# Patient Record
Sex: Male | Born: 1945 | Race: Black or African American | Hispanic: No | Marital: Single | Smoking: Former smoker
Health system: Southern US, Community
[De-identification: ages and names within clinical notes are randomized; demographics above are authoritative.]

## PROBLEM LIST (undated history)

## (undated) ENCOUNTER — Emergency Department (HOSPITAL_COMMUNITY): Admission: EM | Payer: Medicare Other | Source: Home / Self Care

## (undated) DIAGNOSIS — C3490 Malignant neoplasm of unspecified part of unspecified bronchus or lung: Secondary | ICD-10-CM

## (undated) DIAGNOSIS — Z923 Personal history of irradiation: Secondary | ICD-10-CM

## (undated) DIAGNOSIS — E119 Type 2 diabetes mellitus without complications: Secondary | ICD-10-CM

## (undated) DIAGNOSIS — I10 Essential (primary) hypertension: Secondary | ICD-10-CM

## (undated) DIAGNOSIS — T7840XA Allergy, unspecified, initial encounter: Secondary | ICD-10-CM

## (undated) DIAGNOSIS — E785 Hyperlipidemia, unspecified: Secondary | ICD-10-CM

## (undated) HISTORY — DX: Essential (primary) hypertension: I10

## (undated) HISTORY — DX: Type 2 diabetes mellitus without complications: E11.9

## (undated) HISTORY — PX: OTHER SURGICAL HISTORY: SHX169

## (undated) HISTORY — DX: Allergy, unspecified, initial encounter: T78.40XA

---

## 2012-07-04 ENCOUNTER — Encounter (HOSPITAL_COMMUNITY): Payer: Self-pay | Admitting: Emergency Medicine

## 2012-07-04 ENCOUNTER — Emergency Department (INDEPENDENT_AMBULATORY_CARE_PROVIDER_SITE_OTHER)
Admission: EM | Admit: 2012-07-04 | Discharge: 2012-07-04 | Disposition: A | Discharge status: Home / Self Care | Payer: Medicare Other | Source: Home / Self Care | Attending: Family Medicine | Admitting: Family Medicine

## 2012-07-04 DIAGNOSIS — L723 Sebaceous cyst: Secondary | ICD-10-CM

## 2012-07-04 MED ORDER — HYDROCODONE-ACETAMINOPHEN 5-325 MG PO TABS: 1.0000 | ORAL_TABLET | Freq: Four times a day (QID) | ORAL | Status: DC | PRN

## 2012-07-04 NOTE — ED Notes (Signed)
Pt c/o cyst in center of back x 3 days. Swelling and painful. Pt denies drainage.  Pt has used tylenol with no relief of pain.

## 2012-07-04 NOTE — ED Provider Notes (Signed)
History     CSN: [id number]  Arrival date & time 07/04/12  1350   First MD Initiated Contact with Patient 07/04/12 1359      Chief Complaint  Patient presents with  . Cyst    cyst on middle of back x 3 days. no drainage. swollen and painful    (Consider location/radiation/quality/duration/timing/severity/associated sxs/prior treatment) Patient is a 67 y.o. male presenting with abscess. The history is provided by the patient and a relative.  Abscess Location:  Torso Torso abscess location:  Lower back Abscess quality: painful   Red streaking: no   Duration:  2 days Progression:  Worsening Pain details:    Quality:  Dull   Progression:  Worsening Chronicity:  New   History reviewed. No pertinent past medical history.  History reviewed. No pertinent past surgical history.  History reviewed. No pertinent family history.  History  Substance Use Topics  . Smoking status: Never Smoker   . Smokeless tobacco: Not on file  . Alcohol Use: No      Review of Systems  Constitutional: Negative.   Skin: Positive for rash.    Allergies  Review of patient's allergies indicates no known allergies.  Home Medications  No current outpatient prescriptions on file.  There were no vitals taken for this visit.  Physical Exam  Nursing note and vitals reviewed. Constitutional: He is oriented to person, place, and time. He appears well-developed and well-nourished.  Neurological: He is alert and oriented to person, place, and time.  Skin: Skin is warm and dry.  1.5 cm nonerythematous sl fluctuant cystic area on right lumbar back, no drainage.    ED Course  Procedures (including critical care time)  Labs Reviewed - No data to display No results found.   1. Sebaceous cyst       MDM          Linna Hoff, MD 07/04/12 [phone]

## 2012-07-06 ENCOUNTER — Emergency Department (INDEPENDENT_AMBULATORY_CARE_PROVIDER_SITE_OTHER)
Admission: EM | Admit: 2012-07-06 | Discharge: 2012-07-06 | Disposition: A | Discharge status: Home / Self Care | Payer: Medicare Other | Source: Home / Self Care | Attending: Emergency Medicine | Admitting: Emergency Medicine

## 2012-07-06 ENCOUNTER — Encounter (HOSPITAL_COMMUNITY): Payer: Self-pay | Admitting: *Deleted

## 2012-07-06 DIAGNOSIS — L723 Sebaceous cyst: Secondary | ICD-10-CM

## 2012-07-06 DIAGNOSIS — Z4800 Encounter for change or removal of nonsurgical wound dressing: Secondary | ICD-10-CM

## 2012-07-06 MED ORDER — CEPHALEXIN 500 MG PO CAPS: 500.0000 mg | ORAL_CAPSULE | Freq: Three times a day (TID) | ORAL | Status: DC

## 2012-07-06 MED ORDER — HYDROCODONE-ACETAMINOPHEN 5-325 MG PO TABS: ORAL_TABLET | ORAL | Status: DC

## 2012-07-06 NOTE — ED Provider Notes (Signed)
Chief Complaint  Patient presents with  . Abscess    History of Present Illness:    Benjamin Willis is a 67 year old male who has had a long-standing history of a cystic lesion on his right mid back. This recently has become painful. It's not been draining a pus and he's had no fever or chills.  Review of Systems:  Other than noted above, the patient denies any of the following symptoms: Systemic:  No fever, chills or sweats. Skin:  No rash or itching.  PMFSH:  Past medical history, family history, social history, meds, and allergies were reviewed.  No history of diabetes or prior history of abscesses or MRSA.  Physical Exam:   Vital signs:  BP 161/80  Pulse 73  Temp(Src) 97.9 F (36.6 C) (Oral)  Resp 12  SpO2 100% Skin:  There is a cystic lesion on the right mid back. It's tender to touch. There is no erythema or fluctuance.  Skin exam was otherwise normal.  No rash. Ext:  Distal pulses were full, patient has full ROM of all joints.  Procedure:  Verbal informed consent was obtained.  The patient was informed of the risks and benefits of the procedure and understands and accepts.  Identity of the patient was verified verbally and by wristband.   The abscess area described above was prepped with Betadine and alcohol and anesthetized with 3 mL of 2% Xylocaine with epinephrine.  Using a #11 scalpel blade, a singe straight incision was made into the area of swelling, yielding a large amount of malodorous squamous debris drainage.  Routine cultures were obtained.  As much of the squamous debris was removed as possible. Blunt dissection was used to break up loculations and the resulting wound cavity was packed with 1/4 inch Iodoform gauze.  A sterile pressure dressing was applied.  Assessment:  The encounter diagnosis was Sebaceous cyst.  Plan:   1.  The following meds were prescribed:   New Prescriptions   CEPHALEXIN (KEFLEX) 500 MG CAPSULE    Take 1 capsule (500 mg total) by mouth 3 (three)  times daily.   HYDROCODONE-ACETAMINOPHEN (NORCO/VICODIN) 5-325 MG PER TABLET    1 to 2 tabs every 4 to 6 hours as needed for pain.   2.  The patient was instructed in symptomatic care and handouts were given. 3.  The patient was instructed to leave the dressing in place and return again in 48 hours for packing removal.   Reuben Likes, MD 07/06/12 [phone]

## 2012-07-06 NOTE — ED Notes (Signed)
Pt has sebaceous cyst on back that he was seen here for on Sunday. States that he is still in extreme pain  - sister reports that he has not been applying hot compresses and has been unable to find a regular md. Referred to [facility]

## 2012-07-08 ENCOUNTER — Encounter (HOSPITAL_COMMUNITY): Payer: Self-pay | Admitting: *Deleted

## 2012-07-08 ENCOUNTER — Emergency Department (INDEPENDENT_AMBULATORY_CARE_PROVIDER_SITE_OTHER)
Admission: EM | Admit: 2012-07-08 | Discharge: 2012-07-08 | Disposition: A | Discharge status: Home / Self Care | Payer: Medicare Other | Source: Home / Self Care | Attending: Emergency Medicine | Admitting: Emergency Medicine

## 2012-07-08 DIAGNOSIS — Z4800 Encounter for change or removal of nonsurgical wound dressing: Secondary | ICD-10-CM

## 2012-07-08 DIAGNOSIS — L723 Sebaceous cyst: Secondary | ICD-10-CM

## 2012-07-08 NOTE — ED Notes (Signed)
Pt here to have packing removed and wound recheck

## 2012-07-08 NOTE — ED Provider Notes (Signed)
Chief Complaint  Patient presents with  . Wound Check    History of Present Illness:    Benjamin Willis is a 67 year old male who was here a couple days ago because of inflamed sebaceous cyst on his mid back. This was incised and a large amount of squamous debris was drained out. The culture so far is negative. The wound cavity was packed and he returns today for packing removal.  Review of Systems:  Other than noted above, the patient denies any of the following symptoms: Systemic:  No fever, chills or sweats. Skin:  No rash or itching.  PMFSH:  Past medical history, family history, social history, meds, and allergies were reviewed.  No history of diabetes or prior history of abscesses or MRSA.  Physical Exam:   Vital signs:  BP 163/89  Pulse 69  Temp(Src) 97.2 F (36.2 C) (Oral)  Resp 18  SpO2 99% Skin:  Dressing was removed and the packing was taken out. There is no erythema induration or pain to palpation.  Skin exam was otherwise normal.  No rash. Ext:  Distal pulses were full, patient has full ROM of all joints.  Assessment:  The encounter diagnosis was Sebaceous cyst.  Plan:   1.  The following meds were prescribed:   Discharge Medication List as of 07/08/2012 10:37 AM     2.  The patient was instructed in symptomatic care and handouts were given. 3.  The patient was instructed to cleanse the area daily with soap and water and apply antibiotic ointment. This is truly helping him to do this. Suggested that if this recurs he'll need to see a surgeon for a complete surgical removal.   Reuben Likes, MD 07/08/12 [phone]

## 2012-07-09 LAB — CULTURE, ROUTINE-ABSCESS

## 2012-07-14 ENCOUNTER — Encounter (HOSPITAL_COMMUNITY): Payer: Self-pay

## 2012-07-14 ENCOUNTER — Emergency Department (INDEPENDENT_AMBULATORY_CARE_PROVIDER_SITE_OTHER)
Admission: EM | Admit: 2012-07-14 | Discharge: 2012-07-14 | Disposition: A | Discharge status: Home / Self Care | Payer: Medicare Other | Source: Home / Self Care

## 2012-07-14 DIAGNOSIS — L723 Sebaceous cyst: Secondary | ICD-10-CM

## 2012-07-14 NOTE — ED Notes (Signed)
Patient here to establish care with primary doctor

## 2012-07-14 NOTE — ED Provider Notes (Signed)
History     CSN: [id number]  Arrival date & time 07/14/12  1037  Followup after incision and drainage of small sebaceous cyst in the mid back area  Chief Complaint  Patient presents with  . Establish Care    (Consider location/radiation/quality/duration/timing/severity/associated sxs/prior treatment) HPI Patient is 67 year old male who was recently seen in the clinic with cyst in the mid back area and is now status post incision and drainage. Patient reports doing well and believes that the area is healing very well. He denies any problems in that area, no drainage, no blood noted coming out of the area. Patient denies pain, tenderness to touch. Patient also denies fevers and chills or other systemic concerns.  History of hypertension  No family history of heart disease or cancer  History  Substance Use Topics  . Smoking status: Never Smoker   . Smokeless tobacco: Not on file  . Alcohol Use: No     Review of Systems  Constitutional: Negative for fever, chills, diaphoresis, activity change, appetite change and fatigue.  HENT: Negative for ear pain, nosebleeds, congestion, facial swelling, rhinorrhea, neck pain, neck stiffness and ear discharge.   Eyes: Negative for pain, discharge, redness, itching and visual disturbance.  Respiratory: Negative for cough, choking, chest tightness, shortness of breath, wheezing and stridor.   Cardiovascular: Negative for chest pain, palpitations and leg swelling.  Gastrointestinal: Negative for abdominal distention.  Genitourinary: Negative for dysuria, urgency, frequency, hematuria, flank pain, decreased urine volume, difficulty urinating and dyspareunia.  Musculoskeletal: Negative for back pain, joint swelling, arthralgias and gait problem.  Neurological: Negative for dizziness, tremors, seizures, syncope, facial asymmetry, speech difficulty, weakness, light-headedness, numbness and headaches.  Hematological: Negative for adenopathy. Does not  bruise/bleed easily.  Psychiatric/Behavioral: Negative for hallucinations, behavioral problems, confusion, dysphoric mood, decreased concentration and agitation.    Allergies  Review of patient's allergies indicates no known allergies.  Home Medications   Current Outpatient Rx  Name  Route  Sig  Dispense  Refill  . cephALEXin (KEFLEX) 500 MG capsule   Oral   Take 1 capsule (500 mg total) by mouth 3 (three) times daily.   30 capsule   0   . HYDROcodone-acetaminophen (NORCO/VICODIN) 5-325 MG per tablet   Oral   Take 1 tablet by mouth every 6 (six) hours as needed for pain.   6 tablet   0   . HYDROcodone-acetaminophen (NORCO/VICODIN) 5-325 MG per tablet      1 to 2 tabs every 4 to 6 hours as needed for pain.   20 tablet   0     BP 165/91  Pulse 63  Temp(Src) 98.8 F (37.1 C) (Oral)  SpO2 100%  Physical Exam  Physical Exam  Constitutional: Appears well-developed and well-nourished. No distress.  HENT: Normocephalic. External right and left ear normal. Oropharynx is clear and moist.  Eyes: Conjunctivae and EOM are normal. PERRLA, no scleral icterus.  Neck: Normal ROM. Neck supple. No JVD. No tracheal deviation. No thyromegaly.  CVS: RRR, S1/S2 +, no murmurs, no gallops, no carotid bruit.  Pulmonary: Effort and breath sounds normal, no stridor, rhonchi, wheezes, rales.  Abdominal: Soft. BS +,  no distension, tenderness, rebound or guarding.  Musculoskeletal: Normal range of motion. No edema and no tenderness.  Lymphadenopathy: No lymphadenopathy noted, cervical, inguinal. Neuro: Alert. Normal reflexes, muscle tone coordination. No cranial nerve deficit. Skin: A small area of the incision site noted in the mid back area, healing nicely, no erythema, no tenderness to palpation, no swelling  or drainage noted  Psychiatric: Normal mood and affect. Behavior, judgment, thought content normal.    ED Course  Procedures (including critical care time)  Labs Reviewed - No data  to display No results found.   Sebaceous cyst in the back area - Status post drainage approximately 2 weeks ago, area healing very nicely  - Recommended continued care with lukewarm water as needed, no bandage required at the site - Patient advised to take analgesic medicine if needed - Pt advised if the area gets worse he needs to come back and see [place]   MDM  Status post incision and drainage of sebaceous cyst in the back area        Alison Murray, MD 07/14/12 1131

## 2014-12-21 ENCOUNTER — Emergency Department (HOSPITAL_COMMUNITY)
Admission: EM | Admit: 2014-12-21 | Discharge: 2014-12-21 | Disposition: A | Discharge status: Home / Self Care | Payer: Medicare Other | Attending: Emergency Medicine | Admitting: Emergency Medicine

## 2014-12-21 ENCOUNTER — Encounter (HOSPITAL_COMMUNITY): Payer: Self-pay | Admitting: Cardiology

## 2014-12-21 ENCOUNTER — Emergency Department (HOSPITAL_COMMUNITY): Payer: Medicare Other

## 2014-12-21 DIAGNOSIS — R059 Cough, unspecified: Secondary | ICD-10-CM

## 2014-12-21 DIAGNOSIS — R05 Cough: Secondary | ICD-10-CM | POA: Diagnosis present

## 2014-12-21 DIAGNOSIS — R079 Chest pain, unspecified: Secondary | ICD-10-CM | POA: Diagnosis not present

## 2014-12-21 DIAGNOSIS — J189 Pneumonia, unspecified organism: Secondary | ICD-10-CM

## 2014-12-21 DIAGNOSIS — J159 Unspecified bacterial pneumonia: Secondary | ICD-10-CM | POA: Diagnosis not present

## 2014-12-21 DIAGNOSIS — Z720 Tobacco use: Secondary | ICD-10-CM | POA: Insufficient documentation

## 2014-12-21 DIAGNOSIS — Z792 Long term (current) use of antibiotics: Secondary | ICD-10-CM | POA: Insufficient documentation

## 2014-12-21 LAB — CBC WITH DIFFERENTIAL/PLATELET
BASOS PCT: 1 % (ref 0–1)
Basophils Absolute: 0.1 10*3/uL (ref 0.0–0.1)
EOS PCT: 2 % (ref 0–5)
Eosinophils Absolute: 0.1 10*3/uL (ref 0.0–0.7)
HEMATOCRIT: 39.7 % (ref 39.0–52.0)
HEMOGLOBIN: 13 g/dL (ref 13.0–17.0)
LYMPHS PCT: 36 % (ref 12–46)
Lymphs Abs: 2.4 10*3/uL (ref 0.7–4.0)
MCH: 30.6 pg (ref 26.0–34.0)
MCHC: 32.7 g/dL (ref 30.0–36.0)
MCV: 93.4 fL (ref 78.0–100.0)
MONOS PCT: 13 % — AB (ref 3–12)
Monocytes Absolute: 0.9 10*3/uL (ref 0.1–1.0)
NEUTROS PCT: 48 % (ref 43–77)
Neutro Abs: 3.1 10*3/uL (ref 1.7–7.7)
PLATELETS: 273 10*3/uL (ref 150–400)
RBC: 4.25 MIL/uL (ref 4.22–5.81)
RDW: 12.2 % (ref 11.5–15.5)
SMEAR REVIEW: ADEQUATE
WBC: 6.6 10*3/uL (ref 4.0–10.5)

## 2014-12-21 LAB — BASIC METABOLIC PANEL
ANION GAP: 6 (ref 5–15)
BUN: 8 mg/dL (ref 6–20)
CO2: 30 mmol/L (ref 22–32)
Calcium: 9.2 mg/dL (ref 8.9–10.3)
Chloride: 106 mmol/L (ref 101–111)
Creatinine, Ser: 0.71 mg/dL (ref 0.61–1.24)
GFR calc Af Amer: >60 mL/min (ref >60)
GLUCOSE: 66 mg/dL (ref 65–99)
POTASSIUM: 4.4 mmol/L (ref 3.5–5.1)
Sodium: 142 mmol/L (ref 135–145)

## 2014-12-21 LAB — I-STAT TROPONIN, ED: Troponin i, poc: 0 ng/mL (ref 0.00–0.08)

## 2014-12-21 IMAGING — DX DG CHEST 2V
2 series · 2 of 2 positions shown · non-contrast
Comparison: None.

CLINICAL DATA: Fever and productive cough. Chest pain. Symptoms for
3 days. Initial encounter.

EXAM:
CHEST  2 VIEW

[w chest pa]
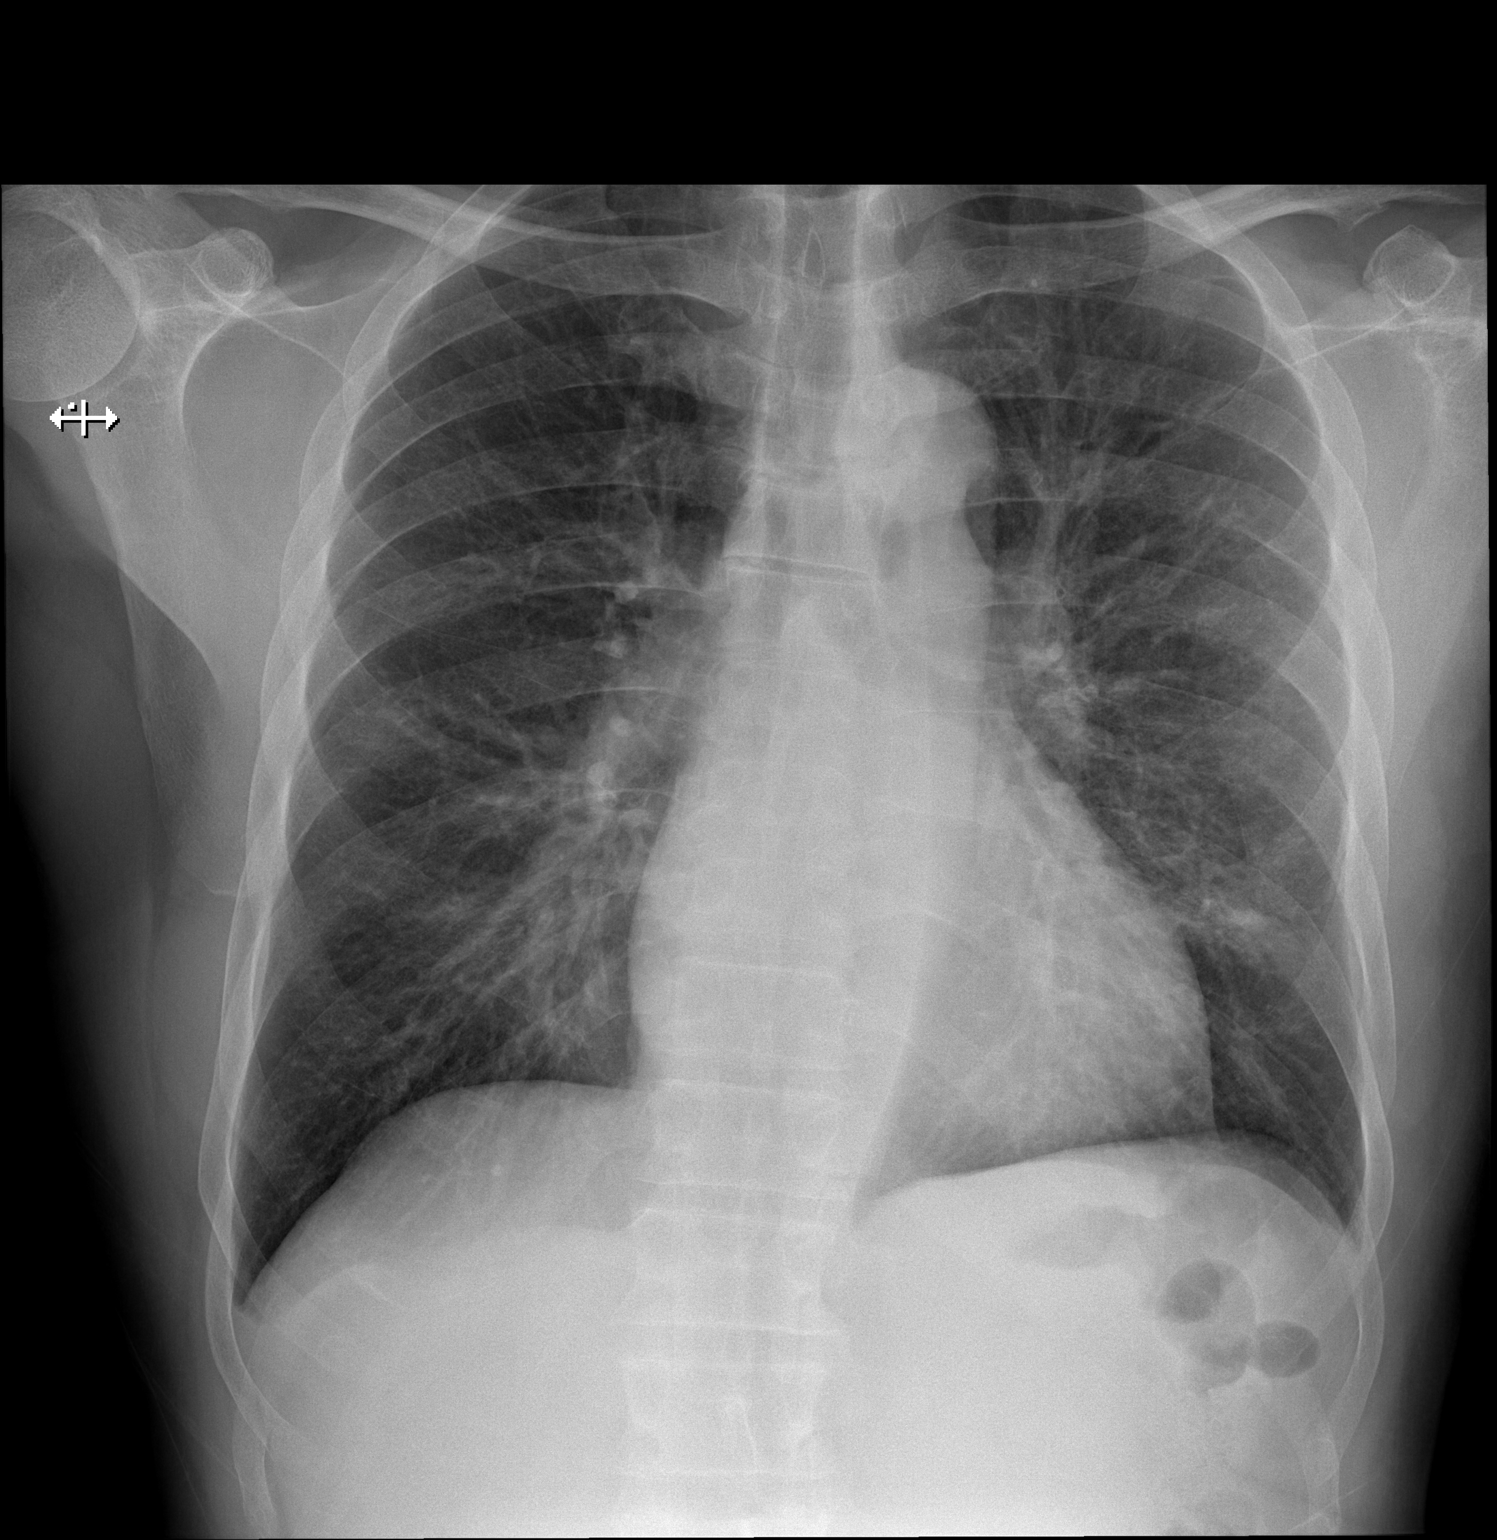

[w chest lat]
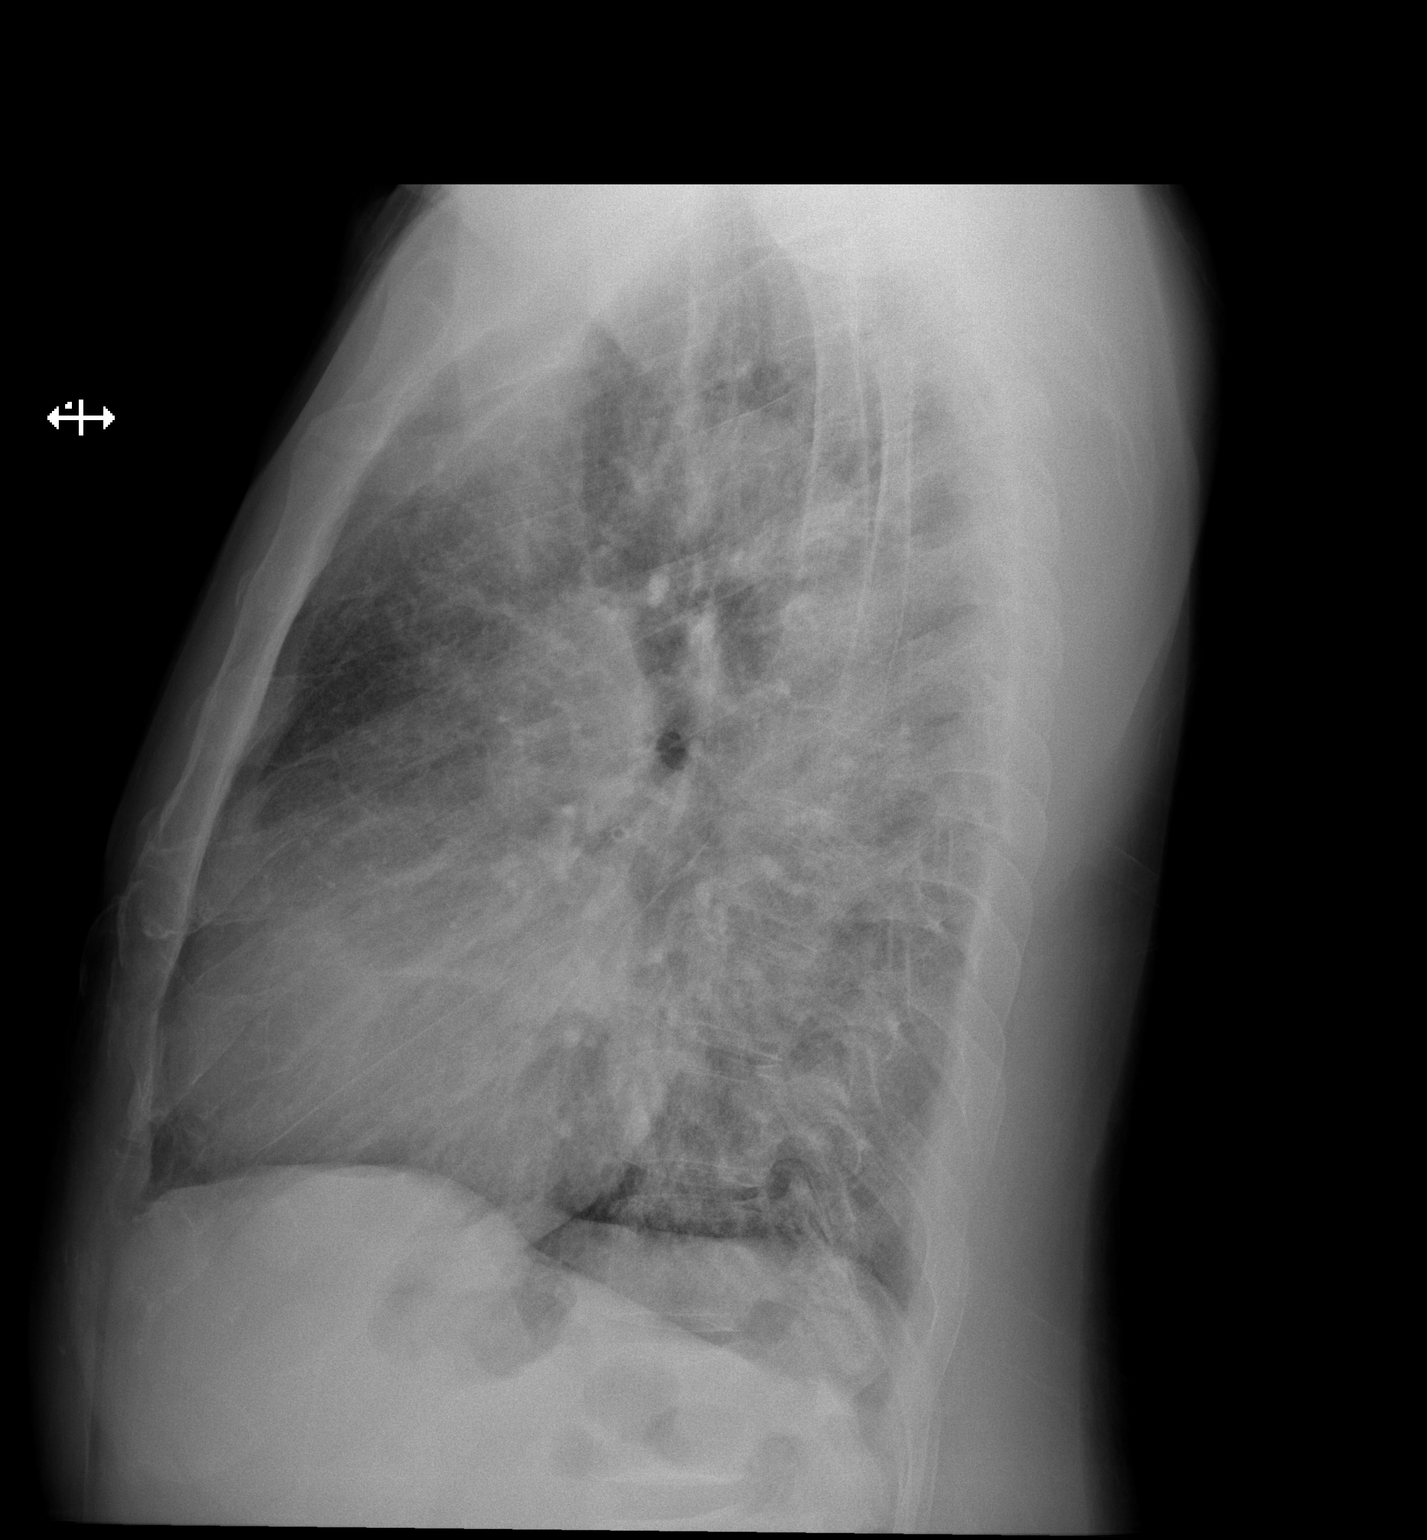

[2 of 2 positions shown; findings below may reference images not displayed]

FINDINGS: Hazy airspace disease is seen in the right lower lobe and superior
segment of the left lower lobe. No pneumothorax or pleural effusion
is identified. Heart size is upper normal.
IMPRESSION: Hazy bilateral airspace disease is most worrisome for pneumonia
given symptoms. Recommend followup to clearing.

## 2014-12-21 MED ORDER — PREDNISONE 20 MG PO TABS
40.0000 mg | ORAL_TABLET | Freq: Every day | ORAL | Status: DC
Start: 2014-12-21 — End: 2017-03-19

## 2014-12-21 MED ORDER — AZITHROMYCIN 250 MG PO TABS: 250.0000 mg | ORAL_TABLET | Freq: Every day | ORAL | Status: DC

## 2014-12-21 MED ORDER — ALBUTEROL SULFATE HFA 108 (90 BASE) MCG/ACT IN AERS: 1.0000 | INHALATION_SPRAY | Freq: Four times a day (QID) | RESPIRATORY_TRACT | Status: DC | PRN

## 2014-12-21 MED ORDER — PREDNISONE 20 MG PO TABS
60.0000 mg | ORAL_TABLET | Freq: Once | ORAL | Status: AC
Start: 2014-12-21 — End: 2014-12-21
  Administered 2014-12-21: 60 mg via ORAL
  Filled 2014-12-21: qty 3

## 2014-12-21 MED ORDER — ALBUTEROL SULFATE (2.5 MG/3ML) 0.083% IN NEBU
5.0000 mg | INHALATION_SOLUTION | Freq: Once | RESPIRATORY_TRACT | Status: AC
  Administered 2014-12-21: 5 mg via RESPIRATORY_TRACT
  Filled 2014-12-21: qty 6

## 2014-12-21 NOTE — Discharge Instructions (Signed)
Take the prescribed medication as directed. Follow-up with a primary care physician in the area-- see resource guide below to help you with this. Return to the ED for new or worsening symptoms.   Emergency Department Resource Guide 1) Find a Doctor and Pay Out of Pocket Although you won't have to find out who is covered by your insurance plan, it is a good idea to ask around and get recommendations. You will then need to call the office and see if the doctor you have chosen will accept you as a new patient and what types of options they offer for patients who are self-pay. Some doctors offer discounts or will set up payment plans for their patients who do not have insurance, but you will need to ask so you aren't surprised when you get to your appointment.  2) Contact Your Local Health Department Not all health departments have doctors that can see patients for sick visits, but many do, so it is worth a call to see if yours does. If you don't know where your local health department is, you can check in your phone book. The CDC also has a tool to help you locate your state's health department, and many state websites also have listings of all of their local health departments.  3) Find a [facility] If your illness is not likely to be very severe or complicated, you may want to try a walk in clinic. These are popping up all over the country in pharmacies, drugstores, and shopping centers. They're usually staffed by nurse practitioners or physician assistants that have been trained to treat common illnesses and complaints. They're usually fairly quick and inexpensive. However, if you have serious medical issues or chronic medical problems, these are probably not your best option.  No Primary Care Doctor: - Call Health Connect at  [phone] - they can help you locate a primary care doctor that  accepts your insurance, provides certain services, etc. - Physician Referral Service-  [phone]  Chronic Pain Problems: Organization         Address  Phone   Notes  [facility]  [phone] Patients need to be referred by their primary care doctor.   Medication Assistance: Organization         Address  Phone   Notes  [facility] Medication [facility] [address] [place], [place] [address] [phone] --Must be a resident of [facility] -- Must have NO insurance coverage whatsoever (no Medicaid/ Medicare, etc.) -- The pt. MUST have a primary care doctor that directs their care regularly and follows them in the community   Med[facility]ist  [phone]   [employer]  [phone]    Agencies that provide inexpensive medical care: Organization         Address  Phone   Notes  [place]  [phone]   [facility]    [phone]   [facility] [place], [place] [address] [phone]   [place] [address] [place] [phone]   Planned Parenthood    [phone]   [facility]    [phone]   [place] and [place] [address], [place] Phone:  [phone], Fax:  [phone] Hours of Operation:  9 am - 6 pm, M-F.  Also accepts Medicaid/Medicare and self-pay.  [facility]  [place], [address], [place] Phone: [phone], Fax: [phone]. Hours of Operation:  8:30 am - 5:30 pm, M-F.  Also accepts Medicaid and self-pay.  [facility] [address], [place] Phone: [phone]   [place], [place], [place] [phone], Ext. [phone] Mondays & Thursdays: 7-9 AM.  First 15 patients are seen on a first come, first serve basis.    [place] Providers:  Organization         Address  Phone   Notes  [facility] [address],  [place] [phone] Also accepts self-pay patients.  [facility] [address], [place]  [phone]   [place], [address], [place] [phone]   [facility] [address] [place] [phone]   Lucianne Lei [address], [place]   [phone] Only accepts [place] Access [place] patients after they have their name applied to their card.   Self-Pay (no insurance) in [facility]:  Organization         Address  Phone   Notes  Sickle Cell Patients, [facility] Internal [facility] [place] [phone]   [facility] [place] [phone]   [facility] [place]  [place], [place], [place] [phone]   [facility]/Dr. Osei-Bonsu  [address], [place] or [address], [place] [phone] Phone number for both [place] and [place] locations is the same.  Urgent Medical [facility] [address], [place] [phone]   [facility] [address] [place] or [address] [phone]   [facility] [address] [place] [phone], phone; [phone], fax Sees patients 1st and 3rd Saturday of every month.  Must not qualify for public or private insurance (i.e. Medicaid, Medicare, [place] Health Choice, Veterans' Benefits)  Household income should be no more than 200% of the poverty level The clinic cannot treat you if you are pregnant or think you are pregnant  Sexually transmitted diseases are not treated at the clinic.    Dental Care: Organization         Address  Phone  Notes  [facility] Department of [facility] [place] [phone] Accepts children up to age 50 who are enrolled in  [place] or [place]; pregnant women with a Medicaid card; and children who have applied for Medicaid or [place] Health Choice, but were declined, whose parents can pay a reduced fee at time of service.  [facility] Department of [facility]  [address], [place] [phone] Accepts children up to age 26 who are enrolled in [place] or [place]; pregnant women with a Medicaid card; and children who have applied for Medicaid or [place] Health Choice, but were declined, whose parents can pay a reduced fee at time of service.  [place] Adult Dental Access PROGRAM  [place] [phone] Patients are seen by appointment only. Walk-ins are not accepted. [place] will see patients 70 years of age and older. Monday - Tuesday (8am-5pm) Most Wednesdays (8:30-5pm) $30 per visit, cash only  [place] Adult  Dental Access PROGRAM  [address], [facility] [phone] Patients are seen by appointment only. Walk-ins are not accepted. [place] will see patients 42 years of age and older. One Wednesday Evening (Monthly: Volunteer Based).  $30 per visit, cash only  [place]  [phone] for adults; Children under age 54, call Graduate Pediatric Dentistry at [phone]. Children aged 65-14, please call [phone] to request a pediatric application.  Dental services are provided in all areas of dental care including fillings, crowns and bridges, complete and partial dentures, implants, gum treatment, root canals, and extractions. Preventive care is also provided. Treatment is provided to both adults and children. Patients are selected via a lottery and there is often a waiting list.   [facility] [address] [place]  [phone] [email or web address]   [facility] [address] [place], [place] [phone], Ext. [phone] Second and Fourth Thursday of each month, opens at 6:30  AM; Clinic ends at 9 AM.  Patients are seen on a first-come first-served basis, and a limited number are seen during each clinic.   [facility]  [address] Hillard Danker [place], [place] [phone]   Eligibility Requirements You must have lived in [place], [place], or [place] counties for at least the last three months.   You cannot be eligible for state or federal sponsored [employer], including [employer], [place], or [employer].   You generally cannot be eligible for healthcare insurance through your employer.    How to apply: Eligibility screenings are held every Tuesday and Wednesday afternoon from 1:00 pm until 4:00 pm. You do not need an appointment for the interview!  [facility] [address] [place], [place]   [place]  [place] Department  [place]  [phone]    Behavioral Health Resources in the Community: Intensive Outpatient Programs Organization         Address  Phone  Notes  [place]. [address], [place], [place] [phone]   [facility] Outpatient [address], [place], [place]   ADS: Alcohol & Drug Svcs [address] [place], [place]   [place] [address]  [place], [place] or [phone]   Substance Abuse Resources Organization         Address  Phone  Notes  Alcohol and Drug Services  [phone]   [place]  [phone]   [facility] [place]   Chinita Pester  [phone]   Residential & Outpatient Substance Abuse Program  [phone]   Psychological Services Organization         Address  Phone  Notes  [facility] [place]  [place]  [phone]   [place] [address], [place] [phone] or  [phone]    Mobile Crisis Teams Organization         Address  Phone  Notes  [facility], Mobile Crisis Care Unit  [phone]   Assertive Psychotherapeutic Services  [address] [place], [place]   [place] [address] [place] [phone]    Self-Help/Support Groups Organization         Address  Phone             Notes  Mental  Health Assoc. of [place] - variety of support groups  [place] Call for more information  Narcotics Anonymous (NA), [facility]aring [facility] [address], [employer] [place]  2 meetings at this location   Special educational needs teacher         Address  Phone  Notes  ASAP Residential Treatment [place],    [place]  [phone]   [facility]  [address] [place] [address], [place], [place]   [place], [place] [phone] Admissions: 8am-3pm M-F  Incentives Substance [place] [address],    [place], [place] [phone]   [facility] [address] [place], [place], [place]   [facility] [place] [facility]ually Violent [facility] Program [address]  [place], [place]   [facility]ight Programs - Intensive Outpatient [address], [place], [place]   [facility] [place].,  [place], [place] [phone] or [phone]   [facility]idential Treatment Services (RTS) [address] [place], [place] Accepts Medicaid  [facility] [place] [address]  [place] [phone] Substance Abuse/Addiction Treatment   [facility] Organization         Address  Phone  Notes  [facility]  [phone]   Domenic Schwab, PhD [address] [place], [place]   [phone] or [phone]   [facility] [place], [place] [phone]   [facility] [address]  [place], [place] [phone] Insurance/Medicaid/sponsorship through [facility] and Families [address] [place]                                    [place], [place] [phone] [place] [address], [place] [phone]    Dr. Adele Schilder  [phone]   [facility] Dept. 1) [address], [place] 2) [place] 3)  [place] [address], [place] [phone]  [phone]   [place] [phone] or [phone] (After Hours)

## 2014-12-21 NOTE — ED Provider Notes (Signed)
CSN: [id number]     Arrival date & time 12/21/14  3976 History   First MD Initiated Contact with Patient 12/21/14 1115     Chief Complaint  Patient presents with  . Hemoptysis     (Consider location/radiation/quality/duration/timing/severity/associated sxs/prior Treatment) The history is provided by the patient and medical records.    This is a 69 year old male with no significant past medical history presenting to the ED for cough. Triage note reports hemoptysis, however patient adamantly denies this. He states he mowed his front lawn a proximally one week ago and after this began having a "chest cold". He reports subjective fever, chills, productive cough with green phlegm, slight headache, chest tightness, and some shortness of breath.  Patient reports his cough seems worse at night when trying to sleep. He states shortness of breath is worse during fits of coughing. Patient has no known cardiac history. He has been a heavy daily smoker for the past 9 years after moving here from [place].  Patient does not currently have a PCP. VSS.  History reviewed. No pertinent past medical history. History reviewed. No pertinent past surgical history. History reviewed. No pertinent family history. Social History  Substance Use Topics  . Smoking status: Current Every Day Smoker  . Smokeless tobacco: None  . Alcohol Use: Yes    Review of Systems  Respiratory: Positive for cough, chest tightness and shortness of breath.   All other systems reviewed and are negative.     Allergies  Review of patient's allergies indicates no known allergies.  Home Medications   Prior to Admission medications   Medication Sig Start Date End Date Taking? Authorizing Provider  cephALEXin (KEFLEX) 500 MG capsule Take 1 capsule (500 mg total) by mouth 3 (three) times daily. 07/06/12   Harden Mo, MD  HYDROcodone-acetaminophen (NORCO/VICODIN) 5-325 MG per tablet Take 1 tablet by mouth every 6 (six) hours as  needed for pain. 07/04/12   Billy Fischer, MD  HYDROcodone-acetaminophen (NORCO/VICODIN) 5-325 MG per tablet 1 to 2 tabs every 4 to 6 hours as needed for pain. 07/06/12   Harden Mo, MD   BP 198/86 mmHg  Pulse 80  Temp(Src) 98.7 F (37.1 C) (Oral)  Resp 18  Wt 210 lb (95.255 kg)  SpO2 96%   Physical Exam  Constitutional: He is oriented to person, place, and time. He appears well-developed and well-nourished. No distress.  HENT:  Head: Normocephalic and atraumatic.  Mouth/Throat: Oropharynx is clear and moist.  Eyes: Conjunctivae and EOM are normal. Pupils are equal, round, and reactive to light.  Neck: Normal range of motion.  Cardiovascular: Normal rate, regular rhythm and normal heart sounds.   Pulmonary/Chest: Effort normal. No respiratory distress. He has wheezes. He has rhonchi.  No distress, speaking in full sentences without difficulty, intermixed wheezes and rhonchi, productive cough noted with yellow phlegm- no hemoptysis  Abdominal: Soft. Bowel sounds are normal.  Musculoskeletal: Normal range of motion.  Neurological: He is alert and oriented to person, place, and time.  Skin: Skin is warm and dry. He is not diaphoretic.  Psychiatric: He has a normal mood and affect.  Nursing note and vitals reviewed.   ED Course  Procedures (including critical care time) Labs Review Labs Reviewed  CBC WITH DIFFERENTIAL/PLATELET - Abnormal; Notable for the following:    Monocytes Relative 13 (*)    All other components within normal limits  BASIC METABOLIC PANEL  I-STAT TROPOININ, ED    Imaging Review Dg Chest 2  View  12/21/2014   CLINICAL DATA:  Fever and productive cough. Chest pain. Symptoms for 3 days. Initial encounter.  EXAM: CHEST  2 VIEW  COMPARISON:  None.  FINDINGS: Hazy airspace disease is seen in the right lower lobe and superior segment of the left lower lobe. No pneumothorax or pleural effusion is identified. Heart size is upper normal.  IMPRESSION: Hazy bilateral  airspace disease is most worrisome for pneumonia given symptoms. Recommend followup to clearing.   Electronically Signed   By: Inge Rise M.D.   On: 12/21/2014 12:01   I have personally reviewed and evaluated these images and lab results as part of my medical decision-making.   EKG Interpretation   Date/Time:  Thursday December 21 2014 12:08:15 EDT Ventricular Rate:  64 PR Interval:  116 QRS Duration: 86 QT Interval:  412 QTC Calculation: 425 R Axis:   73 Text Interpretation:  Sinus rhythm Borderline short PR interval Confirmed  by ZAMMIT  MD, JOSEPH [phone]) on 12/21/2014 1:20:09 PM      MDM   Final diagnoses:  CAP (community acquired pneumonia)  Cough   69 y.o. M here with productive cough.  Triage note reports hemoptysis, however patient adamantly denies this and states he is only coughing up green phlegm. Patient is afebrile, nontoxic. He is in no acute respiratory distress. He does have intermixed wheezes and rhonchi on exam. He is a heavy smoker. We will plan for EKG, labs, CXR.  VSS.  Patient given dose of prednisone and albuterol neb treatment.  EKG sinus rhythm without acute ischemic changes. Labwork is reassuring, no leukocytosis. Troponin negative. Chest x-ray worrisome for pneumonia. Patient's vital signs remained stable on room air. He is not currently requiring any supplemental oxygen and has not had any signs of respiratory distress. Feel it is appropriate to attempt outpatient management. Doubt ACS, PE, dissection, or other acute cardiac event at this time. Symptoms more likely due to his pneumonia. Patient will be started on prednisone taper, azithromycin, and given albuterol inhaler. He does not have a current PCP, I have given him resource guide strongly encouraged him to follow-up.  Discussed plan with patient, he/she acknowledged understanding and agreed with plan of care.  Return precautions given for new or worsening symptoms.  Case discussed with attending  physician, Dr. Roderic Palau, who evaluated patient and agrees with assessment and plan of care.  Larene Pickett, PA-C 12/21/14 [place], PA-C 12/21/14 1419  Milton Ferguson, MD 12/22/14 1524

## 2014-12-21 NOTE — ED Notes (Signed)
Pt reports that he started coughing up blood. Reports that he was has been smoking for many years and is concerned. Denies any pain at this time.

## 2017-01-01 ENCOUNTER — Encounter: Payer: Self-pay | Admitting: Emergency Medicine

## 2017-01-01 ENCOUNTER — Ambulatory Visit (INDEPENDENT_AMBULATORY_CARE_PROVIDER_SITE_OTHER): Payer: Medicare Other | Admitting: Emergency Medicine

## 2017-01-01 VITALS — BP 154/84 | HR 68 | Temp 98.2°F | Resp 16 | Ht 67.5 in | Wt 167.0 lb

## 2017-01-01 DIAGNOSIS — Z1329 Encounter for screening for other suspected endocrine disorder: Secondary | ICD-10-CM

## 2017-01-01 DIAGNOSIS — H1033 Unspecified acute conjunctivitis, bilateral: Secondary | ICD-10-CM | POA: Diagnosis not present

## 2017-01-01 DIAGNOSIS — R030 Elevated blood-pressure reading, without diagnosis of hypertension: Secondary | ICD-10-CM | POA: Diagnosis not present

## 2017-01-01 DIAGNOSIS — Z13228 Encounter for screening for other metabolic disorders: Secondary | ICD-10-CM

## 2017-01-01 DIAGNOSIS — Z1211 Encounter for screening for malignant neoplasm of colon: Secondary | ICD-10-CM

## 2017-01-01 DIAGNOSIS — Z130 Encounter for screening for diseases of the blood and blood-forming organs and certain disorders involving the immune mechanism: Secondary | ICD-10-CM

## 2017-01-01 DIAGNOSIS — Z7189 Other specified counseling: Secondary | ICD-10-CM | POA: Insufficient documentation

## 2017-01-01 MED ORDER — TOBRAMYCIN 0.3 % OP OINT: 1.0000 "application " | TOPICAL_OINTMENT | Freq: Three times a day (TID) | OPHTHALMIC | 1 refills | Status: AC

## 2017-01-01 NOTE — Patient Instructions (Addendum)
IF you received an x-ray today, you will receive an invoice from [facility]. Please contact [facility] at [phone] with questions or concerns regarding your invoice.   IF you received labwork today, you will receive an invoice from [place]. Please contact [facility] at [phone] with questions or concerns regarding your invoice.   Our billing staff will not be able to assist you with questions regarding bills from these companies.  You will be contacted with the lab results as soon as they are available. The fastest way to get your results is to activate your My Chart account. Instructions are located on the last page of this paperwork. If you have not heard from [place] regarding the results in 2 weeks, please contact this office.     Bacterial Conjunctivitis Bacterial conjunctivitis is an infection of your conjunctiva. This is the clear membrane that covers the white part of your eye and the inner surface of your eyelid. This condition can make your eye:  Red or pink.  Itchy.  This condition is caused by bacteria. This condition spreads very easily from person to person (is contagious) and from one eye to the other eye. Follow these instructions at home: Medicines  Take or apply your antibiotic medicine as told by your doctor. Do not stop taking or applying the antibiotic even if you start to feel better.  Take or apply over-the-counter and prescription medicines only as told by your doctor.  Do not touch your eyelid with the eye drop bottle or the ointment tube. Managing discomfort  Wipe any fluid from your eye with a warm, wet washcloth or a cotton ball.  Place a cool, clean washcloth on your eye. Do this for 10-20 minutes, 3-4 times per day. General instructions  Do not wear contact lenses until the irritation is gone. Wear glasses until your doctor says it is okay to wear contacts.  Do not wear eye makeup until your symptoms are gone. Throw away  any old makeup.  Change or wash your pillowcase every day.  Do not share towels or washcloths with anyone.  Wash your hands often with soap and water. Use paper towels to dry your hands.  Do not touch or rub your eyes.  Do not drive or use heavy machinery if your vision is blurry. Contact a doctor if:  You have a fever.  Your symptoms do not get better after 10 days. Get help right away if:  You have a fever and your symptoms suddenly get worse.  You have very bad pain when you move your eye.  Your face: ? Hurts. ? Is red. ? Is swollen.  You have sudden loss of vision. This information is not intended to replace advice given to you by your health care provider. Make sure you discuss any questions you have with your health care provider. Document Released: 01/29/2008 Document Revised: 09/27/2015 Document Reviewed: 02/01/2015 Elsevier Interactive Patient Education  2018 [employer].  How to Take Your Blood Pressure You can take your blood pressure at home with a machine. You may need to check your blood pressure at home:  To check if you have high blood pressure (hypertension).  To check your blood pressure over time.  To make sure your blood pressure medicine is working.  Supplies needed: You will need a blood pressure machine, or monitor. You can buy one at a drugstore or online. When choosing one:  Choose one with an arm cuff.  Choose one that wraps around  your upper arm. Only one finger should fit between your arm and the cuff.  Do not choose one that measures your blood pressure from your wrist or finger.  Your doctor can suggest a monitor. How to prepare Avoid these things for 30 minutes before checking your blood pressure:  Drinking caffeine.  Drinking alcohol.  Eating.  Smoking.  Exercising.  Five minutes before checking your blood pressure:  Pee.  Sit in a dining chair. Avoid sitting in a soft couch or armchair.  Be quiet. Do not  talk.  How to take your blood pressure Follow the instructions that came with your machine. If you have a digital blood pressure monitor, these may be the instructions: 1. Sit up straight. 2. Place your feet on the floor. Do not cross your ankles or legs. 3. Rest your left arm at the level of your heart. You may rest it on a table, desk, or chair. 4. Pull up your shirt sleeve. 5. Wrap the blood pressure cuff around the upper part of your left arm. The cuff should be 1 inch (2.5 cm) above your elbow. It is best to wrap the cuff around bare skin. 6. Fit the cuff snugly around your arm. You should be able to place only one finger between the cuff and your arm. 7. Put the cord inside the groove of your elbow. 8. Press the power button. 9. Sit quietly while the cuff fills with air and loses air. 10. Write down the numbers on the screen. 11. Wait 2-3 minutes and then repeat steps 1-10.  What do the numbers mean? Two numbers make up your blood pressure. The first number is called systolic pressure. The second is called diastolic pressure. An example of a blood pressure reading is "120 over 80" (or 120/80). If you are an adult and do not have a medical condition, use this guide to find out if your blood pressure is normal: Normal  First number: below 120.  Second number: below 80. Elevated  First number: 120-129.  Second number: below 80. Hypertension stage 1  First number: 130-139.  Second number: 80-89. Hypertension stage 2  First number: 140 or above.  Second number: 46 or above. Your blood pressure is above normal even if only the top or bottom number is above normal. Follow these instructions at home:  Check your blood pressure as often as your doctor tells you to.  Take your monitor to your next doctor's appointment. Your doctor will: ? Make sure you are using it correctly. ? Make sure it is working right.  Make sure you understand what your blood pressure numbers should  be.  Tell your doctor if your medicines are causing side effects. Contact a doctor if:  Your blood pressure keeps being high. Get help right away if:  Your first blood pressure number is higher than 180.  Your second blood pressure number is higher than 120. This information is not intended to replace advice given to you by your health care provider. Make sure you discuss any questions you have with your health care provider. Document Released: 04/03/2008 Document Revised: 03/19/2016 Document Reviewed: 09/28/2015 Elsevier Interactive Patient Education  [employer].

## 2017-01-01 NOTE — Progress Notes (Signed)
Benjamin Willis 71 y.o.   Chief Complaint  Patient presents with  . Eye Problem    x 2 weeks with crusty mucus and some pain in AM    HISTORY OF PRESENT ILLNESS: This is a 71 y.o. male complaining of 2 week h/o bilateral eye redness with discharge in am. New patient, no other complaints; no significant PMHx.  Eye Problem   Both eyes are affected.This is a new problem. The current episode started 1 to 4 weeks ago. The problem occurs constantly. The problem has been gradually worsening. 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 The pain is mild. He does not wear contacts. Associated symptoms include an eye discharge, eye redness and itching. Pertinent negatives include no blurred vision, double vision, fever, nausea, photophobia, recent URI or vomiting.     Prior to Admission medications   Medication Sig Start Date End Date Taking? Authorizing Provider  albuterol (PROVENTIL HFA;VENTOLIN HFA) 108 (90 BASE) MCG/ACT inhaler Inhale 1-2 puffs into the lungs every 6 (six) hours as needed for wheezing. 12/21/14   Larene Pickett, PA-C  azithromycin (ZITHROMAX) 250 MG tablet Take 1 tablet (250 mg total) by mouth daily. Take first 2 tablets together, then 1 every day until finished. 12/21/14   Larene Pickett, PA-C  GuaiFENesin (MUCUS RELIEF ADULT PO) Take 1 tablet by mouth daily as needed (congestion).    [provider]  Phenyleph-CPM-DM-Aspirin (ALKA-SELTZER PLUS COLD & COUGH PO) Take 1 tablet by mouth daily as needed (cold symptoms).    [provider]  predniSONE (DELTASONE) 20 MG tablet Take 2 tablets (40 mg total) by mouth daily. Take 40 mg by mouth daily for 3 days, then 20mg  by mouth daily for 3 days, then 10mg  daily for 3 days 12/21/14   Larene Pickett, PA-C    No Known Allergies  There are no active problems to display for this patient.   No past medical history on file.  No past surgical history on file.  Social History   Social History  .  Marital status: Single    Spouse name: N/A  . Number of children: N/A  . Years of education: N/A   Occupational History  . Not on file.   Social History Main Topics  . Smoking status: Current Every Day Smoker  . Smokeless tobacco: Never Used  . Alcohol use Yes  . Drug use: No  . Sexual activity: Yes    Birth control/ protection: Condom   Other Topics Concern  . Not on file   Social History Narrative  . No narrative on file    No family history on file.   Review of Systems  Constitutional: Negative.  Negative for fever and malaise/fatigue.  HENT: Negative for congestion, ear pain, nosebleeds and sore throat.   Eyes: Positive for discharge, redness and itching. Negative for blurred vision, double vision, photophobia and pain.  Respiratory: Negative.  Negative for cough and shortness of breath.   Cardiovascular: Negative.  Negative for chest pain and palpitations.  Gastrointestinal: Negative for abdominal pain, diarrhea, nausea and vomiting.  Genitourinary: Negative for dysuria and hematuria.  Musculoskeletal: Negative for myalgias and neck pain.  Skin: Negative.  Negative for rash.  Neurological: Negative.  Negative for dizziness and headaches.  Endo/Heme/Allergies: Negative.   All other systems reviewed and are negative.   Vitals:   01/01/17 0819  BP: (!) 173/90  Pulse: 68  Resp: 16  Temp: 98.2 F (36.8 C)  SpO2: 99%  Physical Exam  Constitutional: He is oriented to person, place, and time. He appears well-developed and well-nourished.  HENT:  Head: Normocephalic and atraumatic.  Right Ear: External ear normal.  Left Ear: External ear normal.  Nose: Nose normal.  Mouth/Throat: Oropharynx is clear and moist.  Eyes: Pupils are equal, round, and reactive to light. EOM are normal. Right conjunctiva is injected. Left conjunctiva is injected.  Neck: Normal range of motion. Neck supple. No JVD present.  Cardiovascular: Normal rate, regular rhythm, normal heart  sounds and intact distal pulses.   Pulmonary/Chest: Effort normal and breath sounds normal.  Abdominal: Soft. He exhibits no distension. There is no tenderness.  Musculoskeletal: Normal range of motion.  Lymphadenopathy:    He has no cervical adenopathy.  Neurological: He is alert and oriented to person, place, and time. No sensory deficit. He exhibits normal muscle tone.  Skin: Skin is warm and dry. Capillary refill takes less than 2 seconds. No rash noted.  Psychiatric: He has a normal mood and affect. His behavior is normal.  Vitals reviewed.    ASSESSMENT & PLAN: Benjamin Willis was seen today for eye problem.  Diagnoses and all orders for this visit:  Acute conjunctivitis of both eyes, unspecified acute conjunctivitis type -     tobramycin (TOBREX) 0.3 % ophthalmic ointment; Place 1 application into both eyes 3 (three) times daily.  Elevated blood pressure reading in office without diagnosis of hypertension -     CBC with Differential/Platelet -     Comprehensive metabolic panel  Screening for endocrine/metabolic/immunity disorders -     Lipid panel -     Hepatitis C antibody    Patient Instructions       IF you received an x-ray today, you will receive an invoice from [facility]. Please contact [facility] at [phone] with questions or concerns regarding your invoice.   IF you received labwork today, you will receive an invoice from [place]. Please contact [facility] at [phone] with questions or concerns regarding your invoice.   Our billing staff will not be able to assist you with questions regarding bills from these companies.  You will be contacted with the lab results as soon as they are available. The fastest way to get your results is to activate your My Chart account. Instructions are located on the last page of this paperwork. If you have not heard from [place] regarding the results in 2 weeks, please contact this office.     Bacterial  Conjunctivitis Bacterial conjunctivitis is an infection of your conjunctiva. This is the clear membrane that covers the white part of your eye and the inner surface of your eyelid. This condition can make your eye:  Red or pink.  Itchy.  This condition is caused by bacteria. This condition spreads very easily from person to person (is contagious) and from one eye to the other eye. Follow these instructions at home: Medicines  Take or apply your antibiotic medicine as told by your doctor. Do not stop taking or applying the antibiotic even if you start to feel better.  Take or apply over-the-counter and prescription medicines only as told by your doctor.  Do not touch your eyelid with the eye drop bottle or the ointment tube. Managing discomfort  Wipe any fluid from your eye with a warm, wet washcloth or a cotton ball.  Place a cool, clean washcloth on your eye. Do this for 10-20 minutes, 3-4 times per day. General instructions  Do not wear contact lenses until  the irritation is gone. Wear glasses until your doctor says it is okay to wear contacts.  Do not wear eye makeup until your symptoms are gone. Throw away any old makeup.  Change or wash your pillowcase every day.  Do not share towels or washcloths with anyone.  Wash your hands often with soap and water. Use paper towels to dry your hands.  Do not touch or rub your eyes.  Do not drive or use heavy machinery if your vision is blurry. Contact a doctor if:  You have a fever.  Your symptoms do not get better after 10 days. Get help right away if:  You have a fever and your symptoms suddenly get worse.  You have very bad pain when you move your eye.  Your face: ? Hurts. ? Is red. ? Is swollen.  You have sudden loss of vision. This information is not intended to replace advice given to you by your health care provider. Make sure you discuss any questions you have with your health care provider. Document Released:  01/29/2008 Document Revised: 09/27/2015 Document Reviewed: 02/01/2015 Elsevier Interactive Patient Education  2018 [employer].  How to Take Your Blood Pressure You can take your blood pressure at home with a machine. You may need to check your blood pressure at home:  To check if you have high blood pressure (hypertension).  To check your blood pressure over time.  To make sure your blood pressure medicine is working.  Supplies needed: You will need a blood pressure machine, or monitor. You can buy one at a drugstore or online. When choosing one:  Choose one with an arm cuff.  Choose one that wraps around your upper arm. Only one finger should fit between your arm and the cuff.  Do not choose one that measures your blood pressure from your wrist or finger.  Your doctor can suggest a monitor. How to prepare Avoid these things for 30 minutes before checking your blood pressure:  Drinking caffeine.  Drinking alcohol.  Eating.  Smoking.  Exercising.  Five minutes before checking your blood pressure:  Pee.  Sit in a dining chair. Avoid sitting in a soft couch or armchair.  Be quiet. Do not talk.  How to take your blood pressure Follow the instructions that came with your machine. If you have a digital blood pressure monitor, these may be the instructions: 1. Sit up straight. 2. Place your feet on the floor. Do not cross your ankles or legs. 3. Rest your left arm at the level of your heart. You may rest it on a table, desk, or chair. 4. Pull up your shirt sleeve. 5. Wrap the blood pressure cuff around the upper part of your left arm. The cuff should be 1 inch (2.5 cm) above your elbow. It is best to wrap the cuff around bare skin. 6. Fit the cuff snugly around your arm. You should be able to place only one finger between the cuff and your arm. 7. Put the cord inside the groove of your elbow. 8. Press the power button. 9. Sit quietly while the cuff fills with air and  loses air. 10. Write down the numbers on the screen. 11. Wait 2-3 minutes and then repeat steps 1-10.  What do the numbers mean? Two numbers make up your blood pressure. The first number is called systolic pressure. The second is called diastolic pressure. An example of a blood pressure reading is "120 over 80" (or 120/80). If you are  an adult and do not have a medical condition, use this guide to find out if your blood pressure is normal: Normal  First number: below 120.  Second number: below 80. Elevated  First number: 120-129.  Second number: below 80. Hypertension stage 1  First number: 130-139.  Second number: 80-89. Hypertension stage 2  First number: 140 or above.  Second number: 46 or above. Your blood pressure is above normal even if only the top or bottom number is above normal. Follow these instructions at home:  Check your blood pressure as often as your doctor tells you to.  Take your monitor to your next doctor's appointment. Your doctor will: ? Make sure you are using it correctly. ? Make sure it is working right.  Make sure you understand what your blood pressure numbers should be.  Tell your doctor if your medicines are causing side effects. Contact a doctor if:  Your blood pressure keeps being high. Get help right away if:  Your first blood pressure number is higher than 180.  Your second blood pressure number is higher than 120. This information is not intended to replace advice given to you by your health care provider. Make sure you discuss any questions you have with your health care provider. Document Released: 04/03/2008 Document Revised: 03/19/2016 Document Reviewed: 09/28/2015 Elsevier Interactive Patient Education  2018 [employer]      Agustina Caroli, MD [facility]

## 2017-01-02 ENCOUNTER — Telehealth: Payer: Self-pay | Admitting: Emergency Medicine

## 2017-01-02 LAB — CBC WITH DIFFERENTIAL/PLATELET
BASOS ABS: 0 10*3/uL (ref 0.0–0.2)
BASOS: 0 %
EOS (ABSOLUTE): 0.1 10*3/uL (ref 0.0–0.4)
EOS: 1 %
Hematocrit: 40.4 % (ref 37.5–51.0)
Hemoglobin: 13.7 g/dL (ref 13.0–17.7)
IMMATURE GRANS (ABS): 0 10*3/uL (ref 0.0–0.1)
Immature Granulocytes: 0 %
Lymphocytes Absolute: 2.1 10*3/uL (ref 0.7–3.1)
Lymphs: 39 %
MCH: 30.4 pg (ref 26.6–33.0)
MCHC: 33.9 g/dL (ref 31.5–35.7)
MCV: 90 fL (ref 79–97)
MONOCYTES: 7 %
MONOS ABS: 0.4 10*3/uL (ref 0.1–0.9)
Neutrophils Absolute: 2.8 10*3/uL (ref 1.4–7.0)
Neutrophils: 53 %
PLATELETS: 334 10*3/uL (ref 150–379)
RBC: 4.51 x10E6/uL (ref 4.14–5.80)
RDW: 13.3 % (ref 12.3–15.4)
WBC: 5.3 10*3/uL (ref 3.4–10.8)

## 2017-01-02 LAB — LIPID PANEL
Chol/HDL Ratio: 4.1 ratio (ref 0.0–5.0)
Cholesterol, Total: 185 mg/dL (ref 100–199)
HDL: 45 mg/dL (ref >39)
LDL Calculated: 119 mg/dL — ABNORMAL HIGH (ref 0–99)
Triglycerides: 107 mg/dL (ref 0–149)
VLDL Cholesterol Cal: 21 mg/dL (ref 5–40)

## 2017-01-02 LAB — COMPREHENSIVE METABOLIC PANEL
ALT: 17 IU/L (ref 0–44)
AST: 20 IU/L (ref 0–40)
Albumin/Globulin Ratio: 2.1 (ref 1.2–2.2)
Albumin: 4.6 g/dL (ref 3.6–4.8)
Alkaline Phosphatase: 108 IU/L (ref 39–117)
BILIRUBIN TOTAL: 0.4 mg/dL (ref 0.0–1.2)
BUN/Creatinine Ratio: 15 (ref 10–24)
BUN: 12 mg/dL (ref 8–27)
CALCIUM: 9.7 mg/dL (ref 8.6–10.2)
CHLORIDE: 105 mmol/L (ref 96–106)
CO2: 23 mmol/L (ref 20–29)
Creatinine, Ser: 0.79 mg/dL (ref 0.76–1.27)
GFR, EST AFRICAN AMERICAN: 106 mL/min/{1.73_m2} (ref >59)
GFR, EST NON AFRICAN AMERICAN: 92 mL/min/{1.73_m2} (ref >59)
GLUCOSE: 152 mg/dL — AB (ref 65–99)
Globulin, Total: 2.2 g/dL (ref 1.5–4.5)
POTASSIUM: 4.3 mmol/L (ref 3.5–5.2)
Sodium: 145 mmol/L — ABNORMAL HIGH (ref 134–144)
TOTAL PROTEIN: 6.8 g/dL (ref 6.0–8.5)

## 2017-01-02 LAB — HEPATITIS C ANTIBODY

## 2017-01-02 MED ORDER — POLYMYXIN B-TRIMETHOPRIM 10000-0.1 UNIT/ML-% OP SOLN: 2.0000 [drp] | OPHTHALMIC | 1 refills | Status: DC

## 2017-01-02 NOTE — Addendum Note (Signed)
Addended by: Alfredia Ferguson A on: 01/02/2017 01:39 PM   Modules accepted: Orders

## 2017-01-02 NOTE — Telephone Encounter (Signed)
[name] (DEANNA) CALLED TO SAY Benjamin Willis SAW DR. SAGARDIA Thursday (01/01/17) FOR AN EYE INFECTION. DR. Mitchel Honour PRESCRIBED HIM TO HAVE TOBRAMYCIN (TOBREX) 0.3 % OPHTHALMIC OINTMENT. HIS PHARMACY TOLD HIM HE WOULD HAVE TO PAY $256.00. HE IS ON A FIXED INCOME AND CAN NOT AFFORD THIS AMOUNT. SHE WILL TAKE HER BROTHER TO THE PHARMACY TO SHOW THEM THAT HE HAS MEDICARE AND MEDICAID TO SEE IF IT WILL BE CHEAPER. IF NOT HE WILL NEED TO GET SOMETHING ELSE. BEST PHONE [phone] (SISTER'S NAME IS DEANNA LANDIS AND SHE IS ON HER BROTHER'S 2018 HIPAA). (I WILL LET YOU KNOW WHEN SHE CALLS ME BACK ONCE SHE GETS TO THE PHARMACY BUT MAYBE DR. SAGARDIA WILL BE ABLE TO HAVE A SUBSTITUTE READY SINCE THEY WILL ALREADY BE THERE). [name]

## 2017-01-02 NOTE — Addendum Note (Signed)
Addended by: Davina Poke on: 01/02/2017 11:14 AM   Modules accepted: Orders

## 2017-01-07 NOTE — Telephone Encounter (Signed)
Spoke to [employer].  She stated they got an OTC drop the pharmacist recommended and it has cleared up the infection.  If it returns, they will RTC.

## 2017-02-19 ENCOUNTER — Ambulatory Visit (INDEPENDENT_AMBULATORY_CARE_PROVIDER_SITE_OTHER): Payer: Medicare Other | Admitting: Physician Assistant

## 2017-02-19 ENCOUNTER — Encounter: Payer: Self-pay | Admitting: Physician Assistant

## 2017-02-19 VITALS — BP 144/78 | HR 70 | Temp 99.0°F | Resp 16 | Ht 67.25 in | Wt 165.8 lb

## 2017-02-19 DIAGNOSIS — H538 Other visual disturbances: Secondary | ICD-10-CM

## 2017-02-19 DIAGNOSIS — H539 Unspecified visual disturbance: Secondary | ICD-10-CM

## 2017-02-19 DIAGNOSIS — R7309 Other abnormal glucose: Secondary | ICD-10-CM | POA: Diagnosis not present

## 2017-02-19 DIAGNOSIS — J101 Influenza due to other identified influenza virus with other respiratory manifestations: Secondary | ICD-10-CM

## 2017-02-19 DIAGNOSIS — R070 Pain in throat: Secondary | ICD-10-CM | POA: Diagnosis not present

## 2017-02-19 DIAGNOSIS — R5383 Other fatigue: Secondary | ICD-10-CM

## 2017-02-19 LAB — POCT GLYCOSYLATED HEMOGLOBIN (HGB A1C): Hemoglobin A1C: 7.1

## 2017-02-19 LAB — POCT CBC
GRANULOCYTE PERCENT: 67.7 % (ref 37–80)
HEMATOCRIT: 39.8 % — AB (ref 43.5–53.7)
Hemoglobin: 13.1 g/dL — AB (ref 14.1–18.1)
Lymph, poc: 2.3 (ref 0.6–3.4)
MCH, POC: 30.5 pg (ref 27–31.2)
MCHC: 32.8 g/dL (ref 31.8–35.4)
MCV: 93 fL (ref 80–97)
MID (CBC): 0.5 (ref 0–0.9)
MPV: 7.9 fL (ref 0–99.8)
PLATELET COUNT, POC: 264 10*3/uL (ref 142–424)
POC GRANULOCYTE: 5.8 (ref 2–6.9)
POC LYMPH %: 26.9 % (ref 10–50)
POC MID %: 5.4 %M (ref 0–12)
RBC: 4.28 M/uL — AB (ref 4.69–6.13)
RDW, POC: 12.6 %
WBC: 8.5 10*3/uL (ref 4.6–10.2)

## 2017-02-19 LAB — POCT INFLUENZA A/B
INFLUENZA B, POC: POSITIVE — AB
Influenza A, POC: POSITIVE — AB

## 2017-02-19 LAB — POCT RAPID STREP A (OFFICE): Rapid Strep A Screen: NEGATIVE

## 2017-02-19 MED ORDER — OSELTAMIVIR PHOSPHATE 75 MG PO CAPS: 75.0000 mg | ORAL_CAPSULE | Freq: Two times a day (BID) | ORAL | 0 refills | Status: DC

## 2017-02-19 MED ORDER — IBUPROFEN 600 MG PO TABS: 600.0000 mg | ORAL_TABLET | Freq: Three times a day (TID) | ORAL | 0 refills | Status: DC | PRN

## 2017-02-19 MED ORDER — GUAIFENESIN ER 1200 MG PO TB12: 1.0000 | ORAL_TABLET | Freq: Two times a day (BID) | ORAL | 1 refills | Status: DC | PRN

## 2017-02-19 NOTE — Progress Notes (Signed)
PRIMARY CARE AT [facility] [address] [place] [address] [phone]  Date:  02/19/2017   Name:  Benjamin Willis   DOB:  13-Apr-1946   MRN:  [id number]  PCP:  Patient, No Pcp Per    History of Present Illness:  Benjamin Willis is a 71 y.o. male patient who presents to PCP with  Chief Complaint  Patient presents with  . Fatigue    per patient sister, patient has had fatigue and been sluggish with decreased appetite     Took bp 158/70, but she would talk to him and he had a raspy voice.   4 days ago, feels likje someone is gagging.  He has pain with swalllowing.  No cough.  Nasal congestionl.  He has sojme fever and chills.  No sob or dyspnea. He has abdominal pain.  No bloody or back stool.   He was taking tylenol cold and flu for a few days Sister reports that he seems very sluggish.  He is not doing the things he normally does.  He generally will walk to her house every day, but he has not.  He also will take care of a few lawns in the neighborhood, and he has not since the start of his illness.  He generally is sleeping at this time.  Patient eats a lot of canned foods and may eat occasional fastfood.     Former smoker.    Patient Active Problem List   Diagnosis Date Noted  . Acute conjunctivitis of both eyes 01/01/2017  . Elevated blood pressure reading in office without diagnosis of hypertension 01/01/2017  . Screening for endocrine/metabolic/immunity disorders 01/01/2017    No past medical history on file.  No past surgical history on file.  Social History  Substance Use Topics  . Smoking status: Former Smoker    Quit date: 02/20/2015  . Smokeless tobacco: Never Used  . Alcohol use Yes    No family history on file.  No Known Allergies  Medication list has been reviewed and updated.  Current Outpatient Prescriptions on File Prior to Visit  Medication Sig Dispense Refill  . albuterol (PROVENTIL HFA;VENTOLIN HFA) 108 (90 BASE) MCG/ACT inhaler Inhale 1-2 puffs into  the lungs every 6 (six) hours as needed for wheezing. (Patient not taking: Reported on 02/19/2017) 1 Inhaler 0  . azithromycin (ZITHROMAX) 250 MG tablet Take 1 tablet (250 mg total) by mouth daily. Take first 2 tablets together, then 1 every day until finished. (Patient not taking: Reported on 02/19/2017) 6 tablet 0  . Phenyleph-CPM-DM-Aspirin (ALKA-SELTZER PLUS COLD & COUGH PO) Take 1 tablet by mouth daily as needed (cold symptoms).    . predniSONE (DELTASONE) 20 MG tablet Take 2 tablets (40 mg total) by mouth daily. Take 40 mg by mouth daily for 3 days, then 20mg  by mouth daily for 3 days, then 10mg  daily for 3 days (Patient not taking: Reported on 02/19/2017) 12 tablet 0  . trimethoprim-polymyxin b (POLYTRIM) ophthalmic solution Place 2 drops into both eyes every 4 (four) hours. (Patient not taking: Reported on 02/19/2017) 10 mL 1   No current facility-administered medications on file prior to visit.     ROS ROS otherwise unremarkable unless listed above.  Physical Examination: BP (!) 144/78   Pulse 70   Temp 99 F (37.2 C) (Oral)   Resp 16   Ht 5' 7.25" (1.708 m)   Wt 165 lb 12.8 oz (75.2 kg)   SpO2 98%   BMI 25.78 kg/m  Ideal  Body Weight: Weight in (lb) to have BMI = 25: 160.5  Physical Exam  Constitutional: He is oriented to person, place, and time. He appears well-developed and well-nourished. No distress.  HENT:  Head: Normocephalic and atraumatic.  Right Ear: Tympanic membrane, external ear and ear canal normal.  Left Ear: Tympanic membrane, external ear and ear canal normal.  Nose: Mucosal edema and rhinorrhea present. Right sinus exhibits no maxillary sinus tenderness and no frontal sinus tenderness. Left sinus exhibits no maxillary sinus tenderness and no frontal sinus tenderness.  Mouth/Throat: No uvula swelling. Posterior oropharyngeal erythema (mild) present. No oropharyngeal exudate or posterior oropharyngeal edema.  Eyes: Pupils are equal, round, and reactive to  light. Conjunctivae, EOM and lids are normal. Right eye exhibits normal extraocular motion. Left eye exhibits normal extraocular motion.  Neck: Trachea normal and full passive range of motion without pain. No edema and no erythema present.  Cardiovascular: Normal rate.   Pulmonary/Chest: Effort normal. No respiratory distress. He has no decreased breath sounds. He has no wheezes. He has no rhonchi.  Neurological: He is alert and oriented to person, place, and time.  Skin: Skin is warm and dry. He is not diaphoretic.  Psychiatric: He has a normal mood and affect. His behavior is normal. Cognition and memory are impaired (mild in appearance, and appears to be chronic affect).   Results for orders placed or performed in visit on 02/19/17  POCT CBC  Result Value Ref Range   WBC 8.5 4.6 - 10.2 K/uL   Lymph, poc 2.3 0.6 - 3.4   POC LYMPH PERCENT 26.9 10 - 50 %L   MID (cbc) 0.5 0 - 0.9   POC MID % 5.4 0 - 12 %M   POC Granulocyte 5.8 2 - 6.9   Granulocyte percent 67.7 37 - 80 %G   RBC 4.28 (A) 4.69 - 6.13 M/uL   Hemoglobin 13.1 (A) 14.1 - 18.1 g/dL   HCT, POC 39.8 (A) 43.5 - 53.7 %   MCV 93.0 80 - 97 fL   MCH, POC 30.5 27 - 31.2 pg   MCHC 32.8 31.8 - 35.4 g/dL   RDW, POC 12.6 %   Platelet Count, POC 264 142 - 424 K/uL   MPV 7.9 0 - 99.8 fL  POCT Influenza A/B  Result Value Ref Range   Influenza A, POC Positive (A) Negative   Influenza B, POC Positive (A) Negative  POCT rapid strep A  Result Value Ref Range   Rapid Strep A Screen Negative Negative  POCT glycosylated hemoglobin (Hb A1C)  Result Value Ref Range   Hemoglobin A1C 7.1       Assessment and Plan: Benjamin Willis is a 71 y.o. male who is here today for cc of  Chief Complaint  Patient presents with  . Fatigue    per patient sister, patient has had fatigue and been sluggish with decreased appetite  he has new found diabetes today.  Not severe.  There are apparent challenges in administering medication, and he is not living  with his sister.  We have agreed to care for the flu at this time.  Advised supportive treatment.  With the diabetes, we will go ahead and proceed with treatment at patient request.  Follow up in 1 week where we will discuss the regimen of diabetes medication.  Will also assess if we need medication management at home.  Influenza A - Plan: oseltamivir (TAMIFLU) 75 MG capsule  Influenza B - Plan: oseltamivir (TAMIFLU) 75 MG  capsule  Other fatigue - Plan: POCT CBC, POCT Influenza A/B, POCT rapid strep A, Guaifenesin (MUCINEX MAXIMUM STRENGTH) 1200 MG TB12, ibuprofen (ADVIL,MOTRIN) 600 MG tablet  Visual blurriness - Plan: POCT CBC, POCT Influenza A/B  Vision changes - Plan: POCT CBC, POCT Influenza A/B  Elevated glucose - Plan: POCT CBC, POCT Influenza A/B, POCT glycosylated hemoglobin (Hb A1C)  Throat pain - Plan: POCT rapid strep A  [name], PA-C [facility] 10/19/20188:29 AM

## 2017-02-19 NOTE — Patient Instructions (Addendum)
You must hydrate with 64 oz of water per day.  That is almost 4 regular sized water bottles. You can continue your vitamins.   The ibuprofen will help with throat pain and fever.   I would like you to continue to rest for the next day, and until you are feeling like you have more energy. We will talk about the diabetes in 1 week.    Influenza, Adult Influenza ("the flu") is an infection in the lungs, nose, and throat (respiratory tract). It is caused by a virus. The flu causes many common cold symptoms, as well as a high fever and body aches. It can make you feel very sick. The flu spreads easily from person to person (is contagious). Getting a flu shot (influenza vaccination) every year is the best way to prevent the flu. Follow these instructions at home:  Take over-the-counter and prescription medicines only as told by your doctor.  Use a cool mist humidifier to add moisture (humidity) to the air in your home. This can make it easier to breathe.  Rest as needed.  Drink enough fluid to keep your pee (urine) clear or pale yellow.  Cover your mouth and nose when you cough or sneeze.  Wash your hands with soap and water often, especially after you cough or sneeze. If you cannot use soap and water, use hand sanitizer.  Stay home from work or school as told by your doctor. Unless you are visiting your doctor, try to avoid leaving home until your fever has been gone for 24 hours without the use of medicine.  Keep all follow-up visits as told by your doctor. This is important. How is this prevented?  Getting a yearly (annual) flu shot is the best way to avoid getting the flu. You may get the flu shot in late summer, fall, or winter. Ask your doctor when you should get your flu shot.  Wash your hands often or use hand sanitizer often.  Avoid contact with people who are sick during cold and flu season.  Eat healthy foods.  Drink plenty of fluids.  Get enough sleep.  Exercise  regularly. Contact a doctor if:  You get new symptoms.  You have: ? Chest pain. ? Watery poop (diarrhea). ? A fever.  Your cough gets worse.  You start to have more mucus.  You feel sick to your stomach (nauseous).  You throw up (vomit). Get help right away if:  You start to be short of breath or have trouble breathing.  Your skin or nails turn a bluish color.  You have very bad pain or stiffness in your neck.  You get a sudden headache.  You get sudden pain in your face or ear.  You cannot stop throwing up. This information is not intended to replace advice given to you by your health care provider. Make sure you discuss any questions you have with your health care provider. Document Released: 01/29/2008 Document Revised: 09/27/2015 Document Reviewed: 02/13/2015 Elsevier Interactive Patient Education  2017 [employer].    IF you received an x-ray today, you will receive an invoice from [facility]. Please contact [facility] at [phone] with questions or concerns regarding your invoice.   IF you received labwork today, you will receive an invoice from [facility]. Please contact [facility] at [phone] with questions or concerns regarding your invoice.   Our billing staff will not be able to assist you with questions regarding bills from these companies.  You will be contacted with  the lab results as soon as they are available. The fastest way to get your results is to activate your My Chart account. Instructions are located on the last page of this paperwork. If you have not heard from [place] regarding the results in 2 weeks, please contact this office.

## 2017-02-26 ENCOUNTER — Encounter: Payer: Self-pay | Admitting: Physician Assistant

## 2017-02-26 ENCOUNTER — Ambulatory Visit (INDEPENDENT_AMBULATORY_CARE_PROVIDER_SITE_OTHER): Payer: Medicare Other | Admitting: Physician Assistant

## 2017-02-26 VITALS — BP 162/68 | HR 65 | Temp 98.3°F | Resp 16 | Ht 67.0 in | Wt 165.0 lb

## 2017-02-26 DIAGNOSIS — E119 Type 2 diabetes mellitus without complications: Secondary | ICD-10-CM

## 2017-02-26 DIAGNOSIS — Q845 Enlarged and hypertrophic nails: Secondary | ICD-10-CM

## 2017-02-26 DIAGNOSIS — I10 Essential (primary) hypertension: Secondary | ICD-10-CM | POA: Diagnosis not present

## 2017-02-26 DIAGNOSIS — Z23 Encounter for immunization: Secondary | ICD-10-CM | POA: Diagnosis not present

## 2017-02-26 MED ORDER — METFORMIN HCL 500 MG PO TABS: 500.0000 mg | ORAL_TABLET | Freq: Every day | ORAL | 1 refills | Status: DC

## 2017-02-26 MED ORDER — LISINOPRIL 10 MG PO TABS: 10.0000 mg | ORAL_TABLET | Freq: Every day | ORAL | 1 refills | Status: DC

## 2017-02-26 NOTE — Patient Instructions (Addendum)
DASH Eating Plan DASH stands for "Dietary Approaches to Stop Hypertension." The DASH eating plan is a healthy eating plan that has been shown to reduce high blood pressure (hypertension). It may also reduce your risk for type 2 diabetes, heart disease, and stroke. The DASH eating plan may also help with weight loss. What are tips for following this plan? General guidelines  Avoid eating more than 2,300 mg (milligrams) of salt (sodium) a day. If you have hypertension, you may need to reduce your sodium intake to 1,500 mg a day.  Limit alcohol intake to no more than 1 drink a day for nonpregnant women and 2 drinks a day for men. One drink equals 12 oz of beer, 5 oz of wine, or 1 oz of hard liquor.  Work with your health care provider to maintain a healthy body weight or to lose weight. Ask what an ideal weight is for you.  Get at least 30 minutes of exercise that causes your heart to beat faster (aerobic exercise) most days of the week. Activities may include walking, swimming, or biking.  Work with your health care provider or diet and nutrition specialist (dietitian) to adjust your eating plan to your individual calorie needs. Reading food labels  Check food labels for the amount of sodium per serving. Choose foods with less than 5 percent of the Daily Value of sodium. Generally, foods with less than 300 mg of sodium per serving fit into this eating plan.  To find whole grains, look for the word "whole" as the first word in the ingredient list. Shopping  Buy products labeled as "low-sodium" or "no salt added."  Buy fresh foods. Avoid canned foods and premade or frozen meals. Cooking  Avoid adding salt when cooking. Use salt-free seasonings or herbs instead of table salt or sea salt. Check with your health care provider or pharmacist before using salt substitutes.  Do not fry foods. Cook foods using healthy methods such as baking, boiling, grilling, and broiling instead.  Cook with  heart-healthy oils, such as olive, canola, soybean, or sunflower oil. Meal planning   Eat a balanced diet that includes: ? 5 or more servings of fruits and vegetables each day. At each meal, try to fill half of your plate with fruits and vegetables. ? Up to 6-8 servings of whole grains each day. ? Less than 6 oz of lean meat, poultry, or fish each day. A 3-oz serving of meat is about the same size as a deck of cards. One egg equals 1 oz. ? 2 servings of low-fat dairy each day. ? A serving of nuts, seeds, or beans 5 times each week. ? Heart-healthy fats. Healthy fats called Omega-3 fatty acids are found in foods such as flaxseeds and coldwater fish, like sardines, salmon, and mackerel.  Limit how much you eat of the following: ? Canned or prepackaged foods. ? Food that is high in trans fat, such as fried foods. ? Food that is high in saturated fat, such as fatty meat. ? Sweets, desserts, sugary drinks, and other foods with added sugar. ? Full-fat dairy products.  Do not salt foods before eating.  Try to eat at least 2 vegetarian meals each week.  Eat more home-cooked food and less restaurant, buffet, and fast food.  When eating at a restaurant, ask that your food be prepared with less salt or no salt, if possible. What foods are recommended? The items listed may not be a complete list. Talk with your dietitian about what  dietary choices are best for you. Grains Whole-grain or whole-wheat bread. Whole-grain or whole-wheat pasta. Brown rice. Oatmeal. Quinoa. Bulgur. Whole-grain and low-sodium cereals. Pita bread. Low-fat, low-sodium crackers. Whole-wheat flour tortillas. Vegetables Fresh or frozen vegetables (raw, steamed, roasted, or grilled). Low-sodium or reduced-sodium tomato and vegetable juice. Low-sodium or reduced-sodium tomato sauce and tomato paste. Low-sodium or reduced-sodium canned vegetables. Fruits All fresh, dried, or frozen fruit. Canned fruit in natural juice (without  added sugar). Meat and other protein foods Skinless chicken or turkey. Ground chicken or turkey. Pork with fat trimmed off. Fish and seafood. Egg whites. Dried beans, peas, or lentils. Unsalted nuts, nut butters, and seeds. Unsalted canned beans. Lean cuts of beef with fat trimmed off. Low-sodium, lean deli meat. Dairy Low-fat (1%) or fat-free (skim) milk. Fat-free, low-fat, or reduced-fat cheeses. Nonfat, low-sodium ricotta or cottage cheese. Low-fat or nonfat yogurt. Low-fat, low-sodium cheese. Fats and oils Soft margarine without trans fats. Vegetable oil. Low-fat, reduced-fat, or light mayonnaise and salad dressings (reduced-sodium). Canola, safflower, olive, soybean, and sunflower oils. Avocado. Seasoning and other foods Herbs. Spices. Seasoning mixes without salt. Unsalted popcorn and pretzels. Fat-free sweets. What foods are not recommended? The items listed may not be a complete list. Talk with your dietitian about what dietary choices are best for you. Grains Baked goods made with fat, such as croissants, muffins, or some breads. Dry pasta or rice meal packs. Vegetables Creamed or fried vegetables. Vegetables in a cheese sauce. Regular canned vegetables (not low-sodium or reduced-sodium). Regular canned tomato sauce and paste (not low-sodium or reduced-sodium). Regular tomato and vegetable juice (not low-sodium or reduced-sodium). Pickles. Olives. Fruits Canned fruit in a light or heavy syrup. Fried fruit. Fruit in cream or butter sauce. Meat and other protein foods Fatty cuts of meat. Ribs. Fried meat. Bacon. Sausage. Bologna and other processed lunch meats. Salami. Fatback. Hotdogs. Bratwurst. Salted nuts and seeds. Canned beans with added salt. Canned or smoked fish. Whole eggs or egg yolks. Chicken or turkey with skin. Dairy Whole or 2% milk, cream, and half-and-half. Whole or full-fat cream cheese. Whole-fat or sweetened yogurt. Full-fat cheese. Nondairy creamers. Whipped toppings.  Processed cheese and cheese spreads. Fats and oils Butter. Stick margarine. Lard. Shortening. Ghee. Bacon fat. Tropical oils, such as coconut, palm kernel, or palm oil. Seasoning and other foods Salted popcorn and pretzels. Onion salt, garlic salt, seasoned salt, table salt, and sea salt. Worcestershire sauce. Tartar sauce. Barbecue sauce. Teriyaki sauce. Soy sauce, including reduced-sodium. Steak sauce. Canned and packaged gravies. Fish sauce. Oyster sauce. Cocktail sauce. Horseradish that you find on the shelf. Ketchup. Mustard. Meat flavorings and tenderizers. Bouillon cubes. Hot sauce and Tabasco sauce. Premade or packaged marinades. Premade or packaged taco seasonings. Relishes. Regular salad dressings. Where to find more information:  National Heart, Lung, and Blood Institute: www.nhlbi.nih.gov  American Heart Association: www.heart.org Summary  The DASH eating plan is a healthy eating plan that has been shown to reduce high blood pressure (hypertension). It may also reduce your risk for type 2 diabetes, heart disease, and stroke.  With the DASH eating plan, you should limit salt (sodium) intake to 2,300 mg a day. If you have hypertension, you may need to reduce your sodium intake to 1,500 mg a day.  When on the DASH eating plan, aim to eat more fresh fruits and vegetables, whole grains, lean proteins, low-fat dairy, and heart-healthy fats.  Work with your health care provider or diet and nutrition specialist (dietitian) to adjust your eating plan to your individual   calorie needs. This information is not intended to replace advice given to you by your health care provider. Make sure you discuss any questions you have with your health care provider. Document Released: 04/10/2011 Document Revised: 04/14/2016 Document Reviewed: 04/14/2016 Elsevier Interactive Patient Education  2017 Elsevier Inc.  Diabetes Mellitus and Food It is important for you to manage your blood sugar (glucose)  level. Your blood glucose level can be greatly affected by what you eat. Eating healthier foods in the appropriate amounts throughout the day at about the same time each day will help you control your blood glucose level. It can also help slow or prevent worsening of your diabetes mellitus. Healthy eating may even help you improve the level of your blood pressure and reach or maintain a healthy weight. General recommendations for healthful eating and cooking habits include:  Eating meals and snacks regularly. Avoid going long periods of time without eating to lose weight.  Eating a diet that consists mainly of plant-based foods, such as fruits, vegetables, nuts, legumes, and whole grains.  Using low-heat cooking methods, such as baking, instead of high-heat cooking methods, such as deep frying.  Work with your dietitian to make sure you understand how to use the Nutrition Facts information on food labels. How can food affect me? Carbohydrates Carbohydrates affect your blood glucose level more than any other type of food. Your dietitian will help you determine how many carbohydrates to eat at each meal and teach you how to count carbohydrates. Counting carbohydrates is important to keep your blood glucose at a healthy level, especially if you are using insulin or taking certain medicines for diabetes mellitus. Alcohol Alcohol can cause sudden decreases in blood glucose (hypoglycemia), especially if you use insulin or take certain medicines for diabetes mellitus. Hypoglycemia can be a life-threatening condition. Symptoms of hypoglycemia (sleepiness, dizziness, and disorientation) are similar to symptoms of having too much alcohol. If your health care provider has given you approval to drink alcohol, do so in moderation and use the following guidelines:  Women should not have more than one drink per day, and men should not have more than two drinks per day. One drink is equal to: ? 12 oz of beer. ? 5  oz of wine. ? 1 oz of hard liquor.  Do not drink on an empty stomach.  Keep yourself hydrated. Have water, diet soda, or unsweetened iced tea.  Regular soda, juice, and other mixers might contain a lot of carbohydrates and should be counted.  What foods are not recommended? As you make food choices, it is important to remember that all foods are not the same. Some foods have fewer nutrients per serving than other foods, even though they might have the same number of calories or carbohydrates. It is difficult to get your body what it needs when you eat foods with fewer nutrients. Examples of foods that you should avoid that are high in calories and carbohydrates but low in nutrients include:  Trans fats (most processed foods list trans fats on the Nutrition Facts label).  Regular soda.  Juice.  Candy.  Sweets, such as cake, pie, doughnuts, and cookies.  Fried foods.  What foods can I eat? Eat nutrient-rich foods, which will nourish your body and keep you healthy. The food you should eat also will depend on several factors, including:  The calories you need.  The medicines you take.  Your weight.  Your blood glucose level.  Your blood pressure level.  Your cholesterol level.  You should eat a variety of foods, including:  Protein. ? Lean cuts of meat. ? Proteins low in saturated fats, such as fish, egg whites, and beans. Avoid processed meats.  Fruits and vegetables. ? Fruits and vegetables that may help control blood glucose levels, such as apples, mangoes, and yams.  Dairy products. ? Choose fat-free or low-fat dairy products, such as milk, yogurt, and cheese.  Grains, bread, pasta, and rice. ? Choose whole grain products, such as multigrain bread, whole oats, and brown rice. These foods may help control blood pressure.  Fats. ? Foods containing healthful fats, such as nuts, avocado, olive oil, canola oil, and fish.  Does everyone with diabetes mellitus have  the same meal plan? Because every person with diabetes mellitus is different, there is not one meal plan that works for everyone. It is very important that you meet with a dietitian who will help you create a meal plan that is just right for you. This information is not intended to replace advice given to you by your health care provider. Make sure you discuss any questions you have with your health care provider. Document Released: 01/16/2005 Document Revised: 09/27/2015 Document Reviewed: 03/18/2013 Elsevier Interactive Patient Education  2017 [employer].     IF you received an x-ray today, you will receive an invoice from [facility]. Please contact [facility] at [phone] with questions or concerns regarding your invoice.   IF you received labwork today, you will receive an invoice from [facility]. Please contact [facility] at [phone] with questions or concerns regarding your invoice.   Our billing staff will not be able to assist you with questions regarding bills from these companies.  You will be contacted with the lab results as soon as they are available. The fastest way to get your results is to activate your My Chart account. Instructions are located on the last page of this paperwork. If you have not heard from [place] regarding the results in 2 weeks, please contact this office.

## 2017-02-26 NOTE — Progress Notes (Signed)
PRIMARY CARE AT [facility] [address] [place] [address] [phone]  Date:  02/26/2017   Name:  Benjamin Willis   DOB:  01/11/1946   MRN:  [id number]  PCP:  Patient, No Pcp Per    History of Present Illness:  Benjamin Willis is a 71 y.o. male patient who presents to PCP with  Chief Complaint  Patient presents with  . Follow-up    flu     Patient is here for follow up of flu and new onset diabetes.  He was seen here one week ago for flu symptoms, and treated after dx of influenza a and b.  He reports that his symptoms have resolved and he is feeling much better at this time.   a1c was also captured at this visit, after a history of elevated glucose.  a1c 7.1.  Advised to follow up on this.    Patient Active Problem List   Diagnosis Date Noted  . Acute conjunctivitis of both eyes 01/01/2017  . Elevated blood pressure reading in office without diagnosis of hypertension 01/01/2017  . Screening for endocrine/metabolic/immunity disorders 01/01/2017    No past medical history on file.  No past surgical history on file.  Social History  Substance Use Topics  . Smoking status: Former Smoker    Quit date: 02/20/2015  . Smokeless tobacco: Never Used  . Alcohol use Yes    No family history on file.  No Known Allergies  Medication list has been reviewed and updated.  Current Outpatient Prescriptions on File Prior to Visit  Medication Sig Dispense Refill  . Guaifenesin (MUCINEX MAXIMUM STRENGTH) 1200 MG TB12 Take 1 tablet (1,200 mg total) by mouth every 12 (twelve) hours as needed. 14 tablet 1  . ibuprofen (ADVIL,MOTRIN) 600 MG tablet Take 1 tablet (600 mg total) by mouth every 8 (eight) hours as needed. Take when you eat. 21 tablet 0  . Phenyleph-CPM-DM-Aspirin (ALKA-SELTZER PLUS COLD & COUGH PO) Take 1 tablet by mouth daily as needed (cold symptoms).    Marland Kitchen albuterol (PROVENTIL HFA;VENTOLIN HFA) 108 (90 BASE) MCG/ACT inhaler Inhale 1-2 puffs into the lungs every 6 (six) hours as  needed for wheezing. (Patient not taking: Reported on 02/19/2017) 1 Inhaler 0  . azithromycin (ZITHROMAX) 250 MG tablet Take 1 tablet (250 mg total) by mouth daily. Take first 2 tablets together, then 1 every day until finished. (Patient not taking: Reported on 02/19/2017) 6 tablet 0  . oseltamivir (TAMIFLU) 75 MG capsule Take 1 capsule (75 mg total) by mouth 2 (two) times daily. (Patient not taking: Reported on 02/26/2017) 10 capsule 0  . predniSONE (DELTASONE) 20 MG tablet Take 2 tablets (40 mg total) by mouth daily. Take 40 mg by mouth daily for 3 days, then '20mg'$  by mouth daily for 3 days, then '10mg'$  daily for 3 days (Patient not taking: Reported on 02/19/2017) 12 tablet 0  . trimethoprim-polymyxin b (POLYTRIM) ophthalmic solution Place 2 drops into both eyes every 4 (four) hours. (Patient not taking: Reported on 02/19/2017) 10 mL 1   No current facility-administered medications on file prior to visit.     ROS ROS otherwise unremarkable unless listed above.  Physical Examination: BP (!) 162/68   Pulse 65   Temp 98.3 F (36.8 C) (Oral)   Resp 16   Ht '5\' 7"'$  (1.702 m)   Wt 165 lb (74.8 kg)   SpO2 98%   BMI 25.84 kg/m  Ideal Body Weight: Weight in (lb) to have BMI = 25: 159.3  Physical Exam  Constitutional: He is oriented to person, place, and time. He appears well-developed and well-nourished. No distress.  HENT:  Head: Normocephalic and atraumatic.  Eyes: Pupils are equal, round, and reactive to light. Conjunctivae and EOM are normal.  Cardiovascular: Normal rate.   Pulses:      Dorsalis pedis pulses are 2+ on the right side, and 2+ on the left side.  Pulmonary/Chest: Effort normal. No apnea. No respiratory distress. He has no decreased breath sounds. He has no wheezes. He has no rhonchi.  Feet:  Right Foot:  Protective Sensation: 6 sites tested. 6 sites sensed.  Left Foot:  Protective Sensation: 6 sites tested. 6 sites sensed.  Neurological: He is alert and oriented to person,  place, and time.  Skin: Skin is warm and dry. He is not diaphoretic.  Thickened overgrown nails  Psychiatric: He has a normal mood and affect. His behavior is normal.     Assessment and Plan: Benjamin Willis is a 71 y.o. male who is here today  podiatri referral placed today.  Starting metformin and lisinopril nightly.  Discussed how to maintain compliance.  Given pillbox today. Give flu vaccine in 4 weeks. New onset type 2 diabetes mellitus ([name]) - Plan: metFORMIN (GLUCOPHAGE) 500 MG tablet, lisinopril (PRINIVIL,ZESTRIL) 10 MG tablet, CMP14+EGFR, Pneumococcal conjugate vaccine 13-valent IM, Ambulatory referral to Podiatry  Essential hypertension - Plan: lisinopril (PRINIVIL,ZESTRIL) 10 MG tablet, CMP14+EGFR, Pneumococcal conjugate vaccine 13-valent IM  Need for pneumococcal vaccine - Plan: Pneumococcal conjugate vaccine 13-valent IM  Enlarged and hypertrophic nails - Plan: Ambulatory referral to [place], PA-C [facility] 10/25/20181:29 PM

## 2017-02-27 LAB — CMP14+EGFR
A/G RATIO: 1.4 (ref 1.2–2.2)
ALT: 22 IU/L (ref 0–44)
AST: 14 IU/L (ref 0–40)
Albumin: 3.9 g/dL (ref 3.6–4.8)
Alkaline Phosphatase: 79 IU/L (ref 39–117)
BUN/Creatinine Ratio: 15 (ref 10–24)
BUN: 11 mg/dL (ref 8–27)
Bilirubin Total: 0.4 mg/dL (ref 0.0–1.2)
CALCIUM: 9.4 mg/dL (ref 8.6–10.2)
CO2: 27 mmol/L (ref 20–29)
CREATININE: 0.73 mg/dL — AB (ref 0.76–1.27)
Chloride: 105 mmol/L (ref 96–106)
GFR calc Af Amer: 109 mL/min/{1.73_m2} (ref >59)
GFR, EST NON AFRICAN AMERICAN: 95 mL/min/{1.73_m2} (ref >59)
Globulin, Total: 2.7 g/dL (ref 1.5–4.5)
Glucose: 131 mg/dL — ABNORMAL HIGH (ref 65–99)
Potassium: 4.7 mmol/L (ref 3.5–5.2)
Sodium: 147 mmol/L — ABNORMAL HIGH (ref 134–144)
Total Protein: 6.6 g/dL (ref 6.0–8.5)

## 2017-03-09 ENCOUNTER — Encounter: Payer: Self-pay | Admitting: Emergency Medicine

## 2017-03-19 ENCOUNTER — Encounter: Payer: Self-pay | Admitting: Podiatry

## 2017-03-19 ENCOUNTER — Ambulatory Visit (INDEPENDENT_AMBULATORY_CARE_PROVIDER_SITE_OTHER): Payer: Medicare Other | Admitting: Podiatry

## 2017-03-19 VITALS — BP 162/82 | HR 65 | Ht 67.0 in | Wt 165.0 lb

## 2017-03-19 DIAGNOSIS — L97521 Non-pressure chronic ulcer of other part of left foot limited to breakdown of skin: Secondary | ICD-10-CM

## 2017-03-19 DIAGNOSIS — R030 Elevated blood-pressure reading, without diagnosis of hypertension: Secondary | ICD-10-CM

## 2017-03-19 DIAGNOSIS — B351 Tinea unguium: Secondary | ICD-10-CM

## 2017-03-19 DIAGNOSIS — M2010 Hallux valgus (acquired), unspecified foot: Secondary | ICD-10-CM | POA: Diagnosis not present

## 2017-03-19 NOTE — Progress Notes (Signed)
SUBJECTIVE: 71 y.o. year old male presents accompanied by his sister for diabetic foot care. Patient was referred by PCP for toe nails trimmed. He is diabetic newly diagnosed and started on Metformin 2 weeks ago. 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  Review of Systems  Constitutional: Negative for chills and fever.  HENT: Negative.   Eyes: Negative.   Respiratory: Negative.   Cardiovascular: Negative.   Gastrointestinal: Negative.   Genitourinary: Negative.   Neurological: Negative.    OBJECTIVE: DERMATOLOGIC EXAMINATION: Severe dystrophic mycotic nails with fungal debris x 10. Pre ulcerative inter digital lesion on 1st and 2nd toe left with out cellulitis or drainage.  VASCULAR EXAMINATION OF LOWER LIMBS: All pedal pulses are palpable with normal pulsation.  Capillary Filling times within 3 seconds in all digits.  No edema or erythema noted. Temperature gradient from tibial crest to dorsum of foot is within normal bilateral.  NEUROLOGIC EXAMINATION OF THE LOWER LIMBS: Achilles DTR is present and within normal. Monofilament (Semmes-Weinstein 10-gm) sensory testing positive 6 out of 6, bilateral. Vibratory sensations(128Hz  turning fork) intact at medial and lateral forefoot bilateral.  Sharp and Dull discriminatory sensations at the plantar ball of hallux is intact bilateral.   MUSCULOSKELETAL EXAMINATION: Positive for Hallux valgus with bunion bilateral. Contracted 2nd toe left. 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   ASSESSMENT: Newly diagnosed Diabetic. Severe onychomycosis x 10. HAV with bunion deformity bilateral. Pre ulcerative interdigital lesion 1st and 2nd toe at contact surface left foot.   PLAN: Reviewed clinical findings and available treatment options. All nails and pre ulcerative keratotic lesion on left foot debrided. Toe spacer placed at the first web space left foot. Home care instruction given to keep the 1st and 2nd toe separated on left  using cotton ball or gauze to prevent skin breakdown.  As per request Diabetic shoe form dispensed to bring back with PCP certification signature.

## 2017-03-19 NOTE — Patient Instructions (Addendum)
Seen for hypertrophic nails. All nails debrided. Noted of pre ulcerative interdigital lesion on 1st and 2nd toe left foot. Lesion debrided and home care instruction discussed with cotton toe spacer. May benefit from diabetic shoes. Return in 3 months or as needed.

## 2017-03-30 ENCOUNTER — Ambulatory Visit (INDEPENDENT_AMBULATORY_CARE_PROVIDER_SITE_OTHER): Payer: Medicare Other | Admitting: Physician Assistant

## 2017-03-30 ENCOUNTER — Encounter: Payer: Self-pay | Admitting: Physician Assistant

## 2017-03-30 VITALS — BP 138/66 | HR 60 | Temp 97.9°F | Resp 16 | Ht 67.0 in | Wt 162.0 lb

## 2017-03-30 DIAGNOSIS — Z23 Encounter for immunization: Secondary | ICD-10-CM | POA: Diagnosis not present

## 2017-03-30 DIAGNOSIS — E119 Type 2 diabetes mellitus without complications: Secondary | ICD-10-CM | POA: Diagnosis not present

## 2017-03-30 LAB — GLUCOSE, POCT (MANUAL RESULT ENTRY): POC Glucose: 124 mg/dl — AB (ref 70–99)

## 2017-03-30 NOTE — Patient Instructions (Addendum)
Continue to focus on taking out the sweets and soft drinks as you have been doing.   Make sure you are watching your salt intake as well.        IF you received an x-ray today, you will receive an invoice from [facility]. Please contact [facility] at [phone] with questions or concerns regarding your invoice.   IF you received labwork today, you will receive an invoice from [facility]. Please contact [facility] at [phone] with questions or concerns regarding your invoice.   Our billing staff will not be able to assist you with questions regarding bills from these companies.  You will be contacted with the lab results as soon as they are available. The fastest way to get your results is to activate your My Chart account. Instructions are located on the last page of this paperwork. If you have not heard from [place] regarding the results in 2 weeks, please contact this office.

## 2017-03-30 NOTE — Progress Notes (Signed)
PRIMARY CARE AT [facility] [address] [place] [address] [phone]  Date:  03/30/2017   Name:  Benjamin Willis   DOB:  Sep 04, 1945   MRN:  [id number]  PCP:  Patient, No Pcp Per    History of Present Illness:  Benjamin Willis is a 71 y.o. male patient who presents to PCP with  Chief Complaint  Patient presents with  . Follow-up    dm check/med check     Patient was here 5 weeks ago for new onset diabetes mellitus.  Advised to return in 4 weeks for recheck of glucose.   Patient reports he is taking his medications of lisinopril and metformin compliantly.  Reports no side effects at this time.  No cp, palpitations, sob, or leg swelling.   Checked last week 156 during the afternoon without fasting. He is not exercises, but dances and does some lawn care.   He has discontinued 4-5 regular soft drinks daily, and drinking diet sodas and lots of water.  No honey buns as well, which was his staple.  "staying off of sweets". He visited podiatry.  They recommended selsum blue as well as gauze in between the toes.    Patient Active Problem List   Diagnosis Date Noted  . Acute conjunctivitis of both eyes 01/01/2017  . Elevated blood pressure reading in office without diagnosis of hypertension 01/01/2017  . Screening for endocrine/metabolic/immunity disorders 01/01/2017    No past medical history on file.  No past surgical history on file.  Social History   Tobacco Use  . Smoking status: Former Smoker    Last attempt to quit: 02/20/2015    Years since quitting: 2.1  . Smokeless tobacco: Never Used  Substance Use Topics  . Alcohol use: Yes  . Drug use: No    No family history on file.  No Known Allergies  Medication list has been reviewed and updated.  Current Outpatient Medications on File Prior to Visit  Medication Sig Dispense Refill  . albuterol (PROVENTIL HFA;VENTOLIN HFA) 108 (90 BASE) MCG/ACT inhaler Inhale 1-2 puffs into the lungs every 6 (six) hours as needed for  wheezing. 1 Inhaler 0  . lisinopril (PRINIVIL,ZESTRIL) 10 MG tablet Take 1 tablet (10 mg total) by mouth daily. 90 tablet 1  . metFORMIN (GLUCOPHAGE) 500 MG tablet Take 1 tablet (500 mg total) by mouth daily with breakfast. 90 tablet 1   No current facility-administered medications on file prior to visit.     ROS ROS otherwise unremarkable unless listed above.  Physical Examination: BP (!) 142/70   Pulse 60   Temp 97.9 F (36.6 C) (Oral)   Resp 16   Ht 5\' 7"  (1.702 m)   Wt 162 lb (73.5 kg)   SpO2 100%   BMI 25.37 kg/m  Ideal Body Weight: Weight in (lb) to have BMI = 25: 159.3  Physical Exam  Constitutional: He is oriented to person, place, and time. He appears well-developed and well-nourished. No distress.  HENT:  Head: Normocephalic and atraumatic.  Eyes: Conjunctivae and EOM are normal. Pupils are equal, round, and reactive to light.  Cardiovascular: Normal rate and regular rhythm. Exam reveals no friction rub.  No murmur heard. Pulmonary/Chest: Effort normal and breath sounds normal. No respiratory distress.  Feet:  Right Foot:  Protective Sensation: 6 sites tested. 6 sites sensed.  Left Foot:  Protective Sensation: 6 sites sensed.  Neurological: He is alert and oriented to person, place, and time.  Skin: Skin is warm and dry.  He is not diaphoretic.  Mid plantar region of foot is a .5cm smooth hole with dead skin.  No erythema.  Mildly tender just above the area.  No drainage.  Consistent with where nail reported.    Psychiatric: He has a normal mood and affect. His behavior is normal.    Results for orders placed or performed in visit on 03/30/17  POCT glucose (manual entry)  Result Value Ref Range   POC Glucose 124 (A) 70 - 99 mg/dl     Assessment and Plan: Benjamin Willis is a 71 y.o. male who is here today for cc of  Chief Complaint  Patient presents with  . Follow-up    dm check/med check   Advised checking the foot daily if not 3 times per day. He is  using the shampoo and will continue.   Follow up with podiatry in 3 months Follow up a1c in 2 months. Fill for 6 months in 3 month supply, patient and sister requested.  Diabetes mellitus, new onset ([place]) - Plan: POCT glucose (manual entry), HM Diabetes Foot Exam  Need for prophylactic vaccination and inoculation against influenza - Plan: Flu Vaccine QUAD 36+ mos IM  [name], PA-C [facility] 11/26/20189:44 AM

## 2017-05-29 ENCOUNTER — Encounter: Payer: Self-pay | Admitting: Physician Assistant

## 2017-05-29 ENCOUNTER — Ambulatory Visit (INDEPENDENT_AMBULATORY_CARE_PROVIDER_SITE_OTHER): Payer: Medicare Other

## 2017-05-29 ENCOUNTER — Other Ambulatory Visit: Payer: Self-pay

## 2017-05-29 ENCOUNTER — Ambulatory Visit (INDEPENDENT_AMBULATORY_CARE_PROVIDER_SITE_OTHER): Payer: Medicare Other | Admitting: Physician Assistant

## 2017-05-29 VITALS — BP 138/84 | HR 54 | Ht 67.0 in | Wt 163.4 lb

## 2017-05-29 VITALS — BP 168/62 | HR 57 | Temp 98.1°F | Resp 16 | Ht 67.0 in | Wt 161.0 lb

## 2017-05-29 DIAGNOSIS — Z0000 Encounter for general adult medical examination without abnormal findings: Secondary | ICD-10-CM | POA: Diagnosis not present

## 2017-05-29 DIAGNOSIS — Z87891 Personal history of nicotine dependence: Secondary | ICD-10-CM | POA: Diagnosis not present

## 2017-05-29 DIAGNOSIS — Z1211 Encounter for screening for malignant neoplasm of colon: Secondary | ICD-10-CM

## 2017-05-29 DIAGNOSIS — E119 Type 2 diabetes mellitus without complications: Secondary | ICD-10-CM | POA: Diagnosis not present

## 2017-05-29 LAB — POCT GLYCOSYLATED HEMOGLOBIN (HGB A1C): Hemoglobin A1C: 6.7

## 2017-05-29 NOTE — Patient Instructions (Addendum)
[name] , Thank you for taking time to come for your Medicare Wellness Visit. I appreciate your ongoing commitment to your health goals. Please review the following plan we discussed and let me know if I can assist you in the future.   Screening recommendations/referrals: Colonoscopy: You will talk to [facility] today about having the Cologuard done instead of colonoscopy Recommended yearly ophthalmology/optometry visit for glaucoma screening and checkup Recommended yearly dental visit for hygiene and checkup.  Vaccinations: Influenza vaccine: up to date Pneumococcal vaccine: up to date, Pneumovax 23 due after 02/26/2018 Tdap vaccine: declined due to insurance Shingles vaccine: Check with your pharmacy about receiving the Shingrix vaccine     Advanced directives: Advance directive discussed with you today. I have provided a copy for you to complete at home and have notarized. Once this is complete please bring a copy in to our office so we can scan it into your chart.   Conditions/risks identified: Continue to watch your sugar intake.  Next appointment: today @ 10:40 am with [name], next Medicare Wellness visit is 06/01/2018 @ 9:20 am with [name] 70 Years and Older, Male Preventive care refers to lifestyle choices and visits with your health care provider that can promote health and wellness. What does preventive care include?  A yearly physical exam. This is also called an annual well check.  Dental exams once or twice a year.  Routine eye exams. Ask your health care provider how often you should have your eyes checked.  Personal lifestyle choices, including:  Daily care of your teeth and gums.  Regular physical activity.  Eating a healthy diet.  Avoiding tobacco and drug use.  Limiting alcohol use.  Practicing safe sex.  Taking low doses of aspirin every day.  Taking vitamin and mineral supplements as recommended by  your health care provider. What happens during an annual well check? The services and screenings done by your health care provider during your annual well check will depend on your age, overall health, lifestyle risk factors, and family history of disease. Counseling  Your health care provider may ask you questions about your:  Alcohol use.  Tobacco use.  Drug use.  Emotional well-being.  Home and relationship well-being.  Sexual activity.  Eating habits.  History of falls.  Memory and ability to understand (cognition).  Work and work Statistician. Screening  You may have the following tests or measurements:  Height, weight, and BMI.  Blood pressure.  Lipid and cholesterol levels. These may be checked every 5 years, or more frequently if you are over 38 years old.  Skin check.  Lung cancer screening. You may have this screening every year starting at age 40 if you have a 30-pack-year history of smoking and currently smoke or have quit within the past 15 years.  Fecal occult blood test (FOBT) of the stool. You may have this test every year starting at age 58.  Flexible sigmoidoscopy or colonoscopy. You may have a sigmoidoscopy every 5 years or a colonoscopy every 10 years starting at age 62.  Prostate cancer screening. Recommendations will vary depending on your family history and other risks.  Hepatitis C blood test.  Hepatitis B blood test.  Sexually transmitted disease (STD) testing.  Diabetes screening. This is done by checking your blood sugar (glucose) after you have not eaten for a while (fasting). You may have this done every 1-3 years.  Abdominal aortic aneurysm (AAA) screening. You may need this  if you are a current or former smoker.  Osteoporosis. You may be screened starting at age 47 if you are at high risk. Talk with your health care provider about your test results, treatment options, and if necessary, the need for more tests. Vaccines  Your  health care provider may recommend certain vaccines, such as:  Influenza vaccine. This is recommended every year.  Tetanus, diphtheria, and acellular pertussis (Tdap, Td) vaccine. You may need a Td booster every 10 years.  Zoster vaccine. You may need this after age 45.  Pneumococcal 13-valent conjugate (PCV13) vaccine. One dose is recommended after age 51.  Pneumococcal polysaccharide (PPSV23) vaccine. One dose is recommended after age 53. Talk to your health care provider about which screenings and vaccines you need and how often you need them. This information is not intended to replace advice given to you by your health care provider. Make sure you discuss any questions you have with your health care provider. Document Released: 05/18/2015 Document Revised: 01/09/2016 Document Reviewed: 02/20/2015 Elsevier Interactive Patient Education  2017 [employer] Prevention in the Home Falls can cause injuries. They can happen to people of all ages. There are many things you can do to make your home safe and to help prevent falls. What can I do on the outside of my home?  Regularly fix the edges of walkways and driveways and fix any cracks.  Remove anything that might make you trip as you walk through a door, such as a raised step or threshold.  Trim any bushes or trees on the path to your home.  Use bright outdoor lighting.  Clear any walking paths of anything that might make someone trip, such as rocks or tools.  Regularly check to see if handrails are loose or broken. Make sure that both sides of any steps have handrails.  Any raised decks and porches should have guardrails on the edges.  Have any leaves, snow, or ice cleared regularly.  Use sand or salt on walking paths during winter.  Clean up any spills in your garage right away. This includes oil or grease spills. What can I do in the bathroom?  Use night lights.  Install grab bars by the toilet and in the tub and  shower. Do not use towel bars as grab bars.  Use non-skid mats or decals in the tub or shower.  If you need to sit down in the shower, use a plastic, non-slip stool.  Keep the floor dry. Clean up any water that spills on the floor as soon as it happens.  Remove soap buildup in the tub or shower regularly.  Attach bath mats securely with double-sided non-slip rug tape.  Do not have throw rugs and other things on the floor that can make you trip. What can I do in the bedroom?  Use night lights.  Make sure that you have a light by your bed that is easy to reach.  Do not use any sheets or blankets that are too big for your bed. They should not hang down onto the floor.  Have a firm chair that has side arms. You can use this for support while you get dressed.  Do not have throw rugs and other things on the floor that can make you trip. What can I do in the kitchen?  Clean up any spills right away.  Avoid walking on wet floors.  Keep items that you use a lot in easy-to-reach places.  If you need  to reach something above you, use a strong step stool that has a grab bar.  Keep electrical cords out of the way.  Do not use floor polish or wax that makes floors slippery. If you must use wax, use non-skid floor wax.  Do not have throw rugs and other things on the floor that can make you trip. What can I do with my stairs?  Do not leave any items on the stairs.  Make sure that there are handrails on both sides of the stairs and use them. Fix handrails that are broken or loose. Make sure that handrails are as long as the stairways.  Check any carpeting to make sure that it is firmly attached to the stairs. Fix any carpet that is loose or worn.  Avoid having throw rugs at the top or bottom of the stairs. If you do have throw rugs, attach them to the floor with carpet tape.  Make sure that you have a light switch at the top of the stairs and the bottom of the stairs. If you do not  have them, ask someone to add them for you. What else can I do to help prevent falls?  Wear shoes that:  Do not have high heels.  Have rubber bottoms.  Are comfortable and fit you well.  Are closed at the toe. Do not wear sandals.  If you use a stepladder:  Make sure that it is fully opened. Do not climb a closed stepladder.  Make sure that both sides of the stepladder are locked into place.  Ask someone to hold it for you, if possible.  Clearly mark and make sure that you can see:  Any grab bars or handrails.  First and last steps.  Where the edge of each step is.  Use tools that help you move around (mobility aids) if they are needed. These include:  Canes.  Walkers.  Scooters.  Crutches.  Turn on the lights when you go into a dark area. Replace any light bulbs as soon as they burn out.  Set up your furniture so you have a clear path. Avoid moving your furniture around.  If any of your floors are uneven, fix them.  If there are any pets around you, be aware of where they are.  Review your medicines with your doctor. Some medicines can make you feel dizzy. This can increase your chance of falling. Ask your doctor what other things that you can do to help prevent falls. This information is not intended to replace advice given to you by your health care provider. Make sure you discuss any questions you have with your health care provider. Document Released: 02/15/2009 Document Revised: 09/27/2015 Document Reviewed: 05/26/2014 Elsevier Interactive Patient Education  2017 [employer].

## 2017-05-29 NOTE — Progress Notes (Signed)
PRIMARY CARE AT [facility] [address], [place] [address] [phone]  Date:  05/29/2017   Name:  Benjamin Willis   DOB:  1945-06-09   MRN:  [id number]  PCP:  Patient, No Pcp Per    History of Present Illness:  Benjamin Willis is a 72 y.o. male patient who presents to PCP with  Chief Complaint  Patient presents with  . medication review    follow up - Metformin     Follow up for medication at this time.  He is newly diagnosed with dm2.  He is taking the medication compliantly.   He has not been exercising due to the weather. He has cut out sweets, and eating more vegetables at this time.  He has not gotten his colonoscopy.  No blood or black stool.  No hx of colon cancer or ibd familial or personally.  Patient Active Problem List   Diagnosis Date Noted  . Acute conjunctivitis of both eyes 01/01/2017  . Elevated blood pressure reading in office without diagnosis of hypertension 01/01/2017  . Screening for endocrine/metabolic/immunity disorders 01/01/2017    No past medical history on file.  No past surgical history on file.  Social History   Tobacco Use  . Smoking status: Former Smoker    Last attempt to quit: 02/20/2015    Years since quitting: 2.2  . Smokeless tobacco: Never Used  Substance Use Topics  . Alcohol use: No    Frequency: Never  . Drug use: No    No family history on file.  No Known Allergies  Medication list has been reviewed and updated.  Current Outpatient Medications on File Prior to Visit  Medication Sig Dispense Refill  . lisinopril (PRINIVIL,ZESTRIL) 10 MG tablet Take 1 tablet (10 mg total) by mouth daily. 90 tablet 1  . metFORMIN (GLUCOPHAGE) 500 MG tablet Take 1 tablet (500 mg total) by mouth daily with breakfast. 90 tablet 1   No current facility-administered medications on file prior to visit.     ROS ROS otherwise unremarkable unless listed above.  Physical Examination: BP (!) 168/62 (BP Location: Right Arm, Patient Position:  Sitting, Cuff Size: Large)   Pulse (!) 57   Temp 98.1 F (36.7 C) (Oral)   Resp 16   Ht 5\' 7"  (1.702 m)   Wt 161 lb (73 kg)   SpO2 99%   BMI 25.22 kg/m  Ideal Body Weight: Weight in (lb) to have BMI = 25: 159.3  Physical Exam  Constitutional: He is oriented to person, place, and time. He appears well-developed and well-nourished. No distress.  HENT:  Head: Normocephalic and atraumatic.  Eyes: Conjunctivae and EOM are normal. Pupils are equal, round, and reactive to light.  Cardiovascular: Normal rate, regular rhythm, normal heart sounds and intact distal pulses. Exam reveals no friction rub.  No murmur heard. Pulmonary/Chest: Effort normal and breath sounds normal. No respiratory distress.  Neurological: He is alert and oriented to person, place, and time.  Skin: Skin is warm and dry. He is not diaphoretic.  Psychiatric: He has a normal mood and affect. His behavior is normal.    Results for orders placed or performed in visit on 05/29/17  POCT glycosylated hemoglobin (Hb A1C)  Result Value Ref Range   Hemoglobin A1C 6.7      Assessment and Plan: Benjamin Willis is a 72 y.o. male who is here today for cc of  Chief Complaint  Patient presents with  . medication review    follow up -  Metformin   Will obtain cologuard as he is reluctant to do cologuard. a1c much better. Follow up in 3 months. Advised to obtain eye visit.  Diabetes mellitus, new onset ([place]) - Plan: POCT glycosylated hemoglobin (Hb A1C), Ambulatory referral to Ophthalmology  Special screening for malignant neoplasms, colon - Plan: Cologuard  [name], PA-C [facility] 2/5/20196:13 PM

## 2017-05-29 NOTE — Progress Notes (Signed)
Subjective:   Benjamin Willis is a 72 y.o. male who presents for an Initial Medicare Annual Wellness Visit.  Review of Systems  N/A Cardiac Risk Factors include: advanced age (>44men, >86 women);male gender    Objective:    Today's Vitals   05/29/17 0917  BP: 138/84  Pulse: (!) 54  SpO2: 100%  Weight: 163 lb 6 oz (74.1 kg)  Height: 5\' 7"  (1.702 m)   Body mass index is 25.59 kg/m.  Advanced Directives 05/29/2017  Does Patient Have a Medical Advance Directive? No  Would patient like information on creating a medical advance directive? Yes (MAU/Ambulatory/Procedural Areas - Information given)    Current Medications (verified) Outpatient Encounter Medications as of 05/29/2017  Medication Sig  . lisinopril (PRINIVIL,ZESTRIL) 10 MG tablet Take 1 tablet (10 mg total) by mouth daily.  . metFORMIN (GLUCOPHAGE) 500 MG tablet Take 1 tablet (500 mg total) by mouth daily with breakfast.  . Multiple Vitamins-Minerals (MULTIVITAMIN MEN) TABS Take 1 tablet by mouth daily.  . [DISCONTINUED] albuterol (PROVENTIL HFA;VENTOLIN HFA) 108 (90 BASE) MCG/ACT inhaler Inhale 1-2 puffs into the lungs every 6 (six) hours as needed for wheezing.   No facility-administered encounter medications on file as of 05/29/2017.     Allergies (verified) Patient has no known allergies.   History: History reviewed. No pertinent past medical history. History reviewed. No pertinent surgical history. History reviewed. No pertinent family history. Social History   Socioeconomic History  . Marital status: Single    Spouse name: None  . Number of children: 0  . Years of education: None  . Highest education level: 9th grade  Social Needs  . Financial resource strain: Not hard at all  . Food insecurity - worry: Never true  . Food insecurity - inability: Never true  . Transportation needs - medical: No  . Transportation needs - non-medical: No  Occupational History  . None  Tobacco Use  . Smoking status:  Former Smoker    Last attempt to quit: 02/20/2015    Years since quitting: 2.2  . Smokeless tobacco: Never Used  Substance and Sexual Activity  . Alcohol use: No    Frequency: Never  . Drug use: No  . Sexual activity: Yes    Birth control/protection: Condom  Other Topics Concern  . None  Social History Narrative  . None   Tobacco Counseling Counseling given: Not Answered   Clinical Intake:  Pre-visit preparation completed: Yes  Pain : No/denies pain     Nutritional Status: BMI 25 -29 Overweight Nutritional Risks: None Diabetes: No  How often do you need to have someone help you when you read instructions, pamphlets, or other written materials from your doctor or pharmacy?: 1 - Never What is the last grade level you completed in school?: 9th grade   Interpreter Needed?: No  Information entered by :: Andrez Grime, LPN  Activities of Daily Living In your present state of health, do you have any difficulty performing the following activities: 05/29/2017  Hearing? N  Vision? N  Difficulty concentrating or making decisions? N  Walking or climbing stairs? Y  Comment Patient has some trouble with climbing stairs  Dressing or bathing? N  Doing errands, shopping? N  Preparing Food and eating ? N  Using the Toilet? N  In the past six months, have you accidently leaked urine? N  Do you have problems with loss of bowel control? N  Managing your Medications? N  Managing your Finances? N  Housekeeping or  managing your Housekeeping? N  Some recent data might be hidden     Immunizations and Health Maintenance Immunization History  Administered Date(s) Administered  . Influenza,inj,Quad PF,6+ Mos 03/30/2017  . Pneumococcal Conjugate-13 02/26/2017   There are no preventive care reminders to display for this patient.  Patient Care Team: Patient, No Pcp Per as PCP - General (General Practice) Joretta Bachelor, PA as Physician Assistant (Physician  Assistant)  Indicate any recent Medical Services you may have received from other than [facility] providers in the past year (date may be approximate).    Assessment:   This is a routine wellness examination for Benjamin Willis.  Hearing/Vision screen Hearing Screening Comments: Patient has not had a hearing exam in the past Vision Screening Comments: Patient states that he has not had a comprehensive eye exam in a long time. But will schedule one soon  Dietary issues and exercise activities discussed: Current Exercise Habits: The patient does not participate in regular exercise at present(stays very active at home doing yard work), Exercise limited by: None identified  Goals    . DIET - REDUCE SUGAR INTAKE     Patient states that he will continue to watch his sugar intake.       Depression Screen PHQ 2/9 Scores 05/29/2017 05/29/2017 03/30/2017 02/26/2017  PHQ - 2 Score 0 0 0 0    Fall Risk Fall Risk  05/29/2017 05/29/2017 03/30/2017 02/26/2017 02/19/2017  Falls in the past year? No No No No No    Is the patient's home free of loose throw rugs in walkways, pet beds, electrical cords, etc?   yes      Grab bars in the bathroom? no      Handrails on the stairs?   no, no steps at home       Adequate lighting?   yes  Timed Get Up and Go performed: yes, completed within 30 seconds  Cognitive Function:     6CIT Screen 05/29/2017  What Year? 4 points  What month? 0 points  What time? 3 points  Count back from 20 4 points  Months in reverse 4 points  Repeat phrase 10 points  Total Score 25    Screening Tests Health Maintenance  Topic Date Due  . OPHTHALMOLOGY EXAM  05/29/2018 (Originally 05/29/1955)  . COLONOSCOPY  05/29/2018 (Originally 05/29/1997)  . TETANUS/TDAP  05/29/2018 (Originally 05/29/1966)  . HEMOGLOBIN A1C  08/20/2017  . PNA vac Low Risk Adult (2 of 2 - PPSV23) 02/26/2018  . FOOT EXAM  03/30/2018  . INFLUENZA VACCINE  Completed  . Hepatitis C Screening  Completed    Qualifies  for Shingles Vaccine? Advised patient to check with pharmacy about receiving the Shingrix vaccine  Cancer Screenings: Lung: Low Dose CT Chest recommended if Age 61-80 years, 30 pack-year currently smoking OR have quit w/in 15years. Patient does qualify. Referral to [facility] for lung cancer screening ordered. They will contact patient.  Colorectal: Patient states that he will talk to Duncan at his visit today to discuss doing the cologuard instead of the colonoscopy.   Additional Screenings:  Hepatitis B/HIV/Syphillis: not indicated  Hepatitis C Screening: completed 01/01/2017  Patient declined tetanus due to insurance rules.     Plan:   I have personally reviewed and noted the following in the patient's chart:   . Medical and social history . Use of alcohol, tobacco or illicit drugs  . Current medications and supplements . Functional ability and status . Nutritional status . Physical activity .  Advanced directives . List of other physicians . Hospitalizations, surgeries, and ER visits in previous 12 months . Vitals . Screenings to include cognitive, depression, and falls . Referrals and appointments  In addition, I have reviewed and discussed with patient certain preventive protocols, quality metrics, and best practice recommendations. A written personalized care plan for preventive services as well as general preventive health recommendations were provided to patient.   1. History of tobacco abuse - Ambulatory Referral for Lung Cancer Scre  2. Encounter for Medicare annual wellness exam    Andrez Grime, LPN   9/52/8413

## 2017-05-29 NOTE — Patient Instructions (Addendum)
  HAPPY BIRTHDAY!!!! Keep up the good work!  I am glad you are taking your medication, and the a1c looks a lot better.  I will see you in 3 months. Someone will be contacting you about your appointment with the eye doctor.  Enjoy your very special day!!  IF you received an x-ray today, you will receive an invoice from [facility]. Please contact [facility] at [phone] with questions or concerns regarding your invoice.   IF you received labwork today, you will receive an invoice from [facility]. Please contact [facility] at [phone] with questions or concerns regarding your invoice.   Our billing staff will not be able to assist you with questions regarding bills from these companies.  You will be contacted with the lab results as soon as they are available. The fastest way to get your results is to activate your My Chart account. Instructions are located on the last page of this paperwork. If you have not heard from [place] regarding the results in 2 weeks, please contact this office.

## 2017-06-01 ENCOUNTER — Ambulatory Visit: Payer: Medicare Other | Admitting: Physician Assistant

## 2017-06-01 ENCOUNTER — Other Ambulatory Visit: Payer: Self-pay | Admitting: Acute Care

## 2017-06-01 DIAGNOSIS — Z87891 Personal history of nicotine dependence: Secondary | ICD-10-CM

## 2017-06-01 DIAGNOSIS — Z122 Encounter for screening for malignant neoplasm of respiratory organs: Secondary | ICD-10-CM

## 2017-06-09 DIAGNOSIS — E1169 Type 2 diabetes mellitus with other specified complication: Secondary | ICD-10-CM | POA: Insufficient documentation

## 2017-06-09 DIAGNOSIS — E119 Type 2 diabetes mellitus without complications: Secondary | ICD-10-CM | POA: Insufficient documentation

## 2017-06-16 LAB — HM DIABETES EYE EXAM

## 2017-06-17 ENCOUNTER — Encounter: Payer: Medicare Other | Admitting: Acute Care

## 2017-06-17 ENCOUNTER — Ambulatory Visit (INDEPENDENT_AMBULATORY_CARE_PROVIDER_SITE_OTHER): Payer: Medicare Other | Admitting: Acute Care

## 2017-06-17 ENCOUNTER — Ambulatory Visit (INDEPENDENT_AMBULATORY_CARE_PROVIDER_SITE_OTHER)
Admission: RE | Admit: 2017-06-17 | Discharge: 2017-06-17 | Disposition: A | Discharge status: Home / Self Care | Payer: Medicare Other | Source: Ambulatory Visit | Attending: Acute Care | Admitting: Acute Care

## 2017-06-17 ENCOUNTER — Encounter: Payer: Self-pay | Admitting: Acute Care

## 2017-06-17 DIAGNOSIS — Z87891 Personal history of nicotine dependence: Secondary | ICD-10-CM | POA: Diagnosis not present

## 2017-06-17 DIAGNOSIS — Z122 Encounter for screening for malignant neoplasm of respiratory organs: Secondary | ICD-10-CM

## 2017-06-17 IMAGING — CT CT CHEST LUNG CANCER SCREENING LOW DOSE W/O CM
2 of 4 series · 15 of 40 positions shown, 18 images · non-contrast
Comparison: None.

CLINICAL DATA: 72-year-old male former smoker, quit 4 years ago,
with 48 pack-year history of smoking, for initial lung cancer
screening

EXAM:
CT CHEST WITHOUT CONTRAST LOW-DOSE FOR LUNG CANCER SCREENING
TECHNIQUE: Multidetector CT imaging of the chest was performed following the
standard protocol without IV contrast.

[Series 2: thorax 5.0 i31f 3 · axial · 0.72mm/px · z∈[-341,-56]mm · 12 of 63 slices shown, 15 images]
[im 3/63  mediastinal]
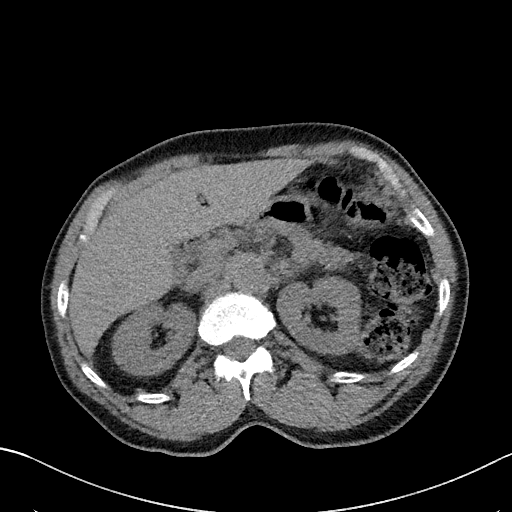
[im 3/63  lung]
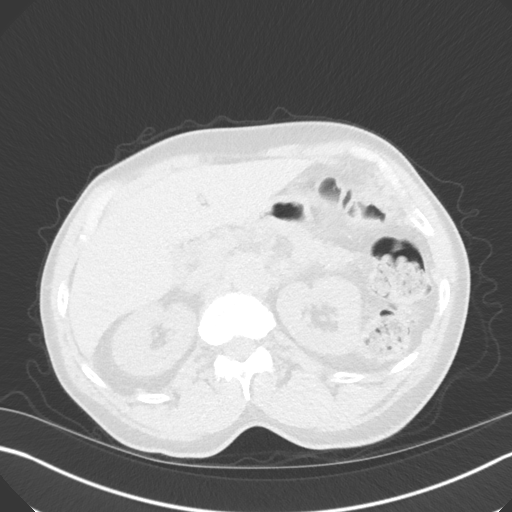
[im 8/63  lung]
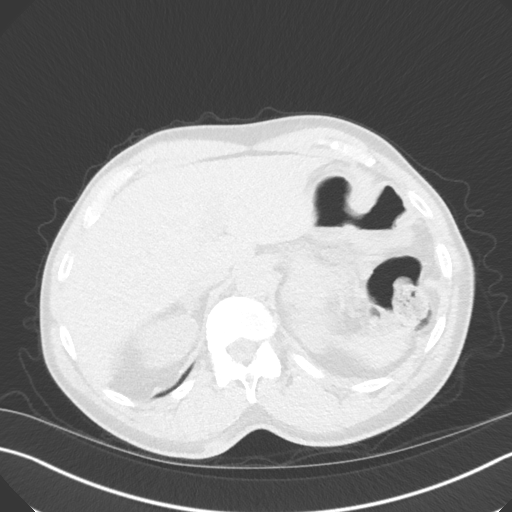
[im 13/63  lung]
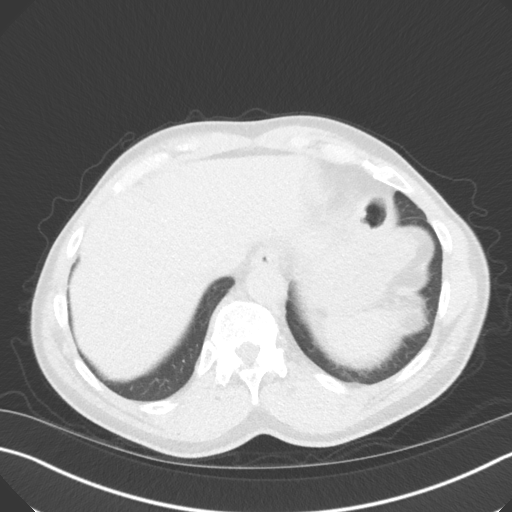
[im 19/63  lung]
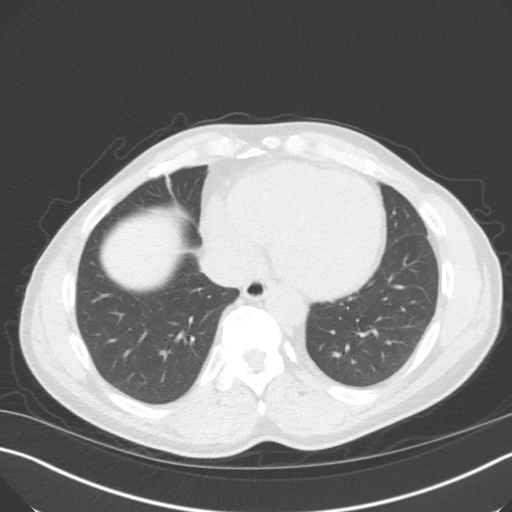
[im 24/63  mediastinal]
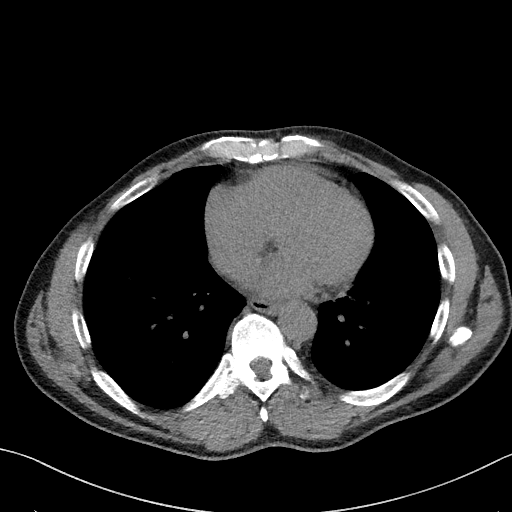
[im 24/63  lung]
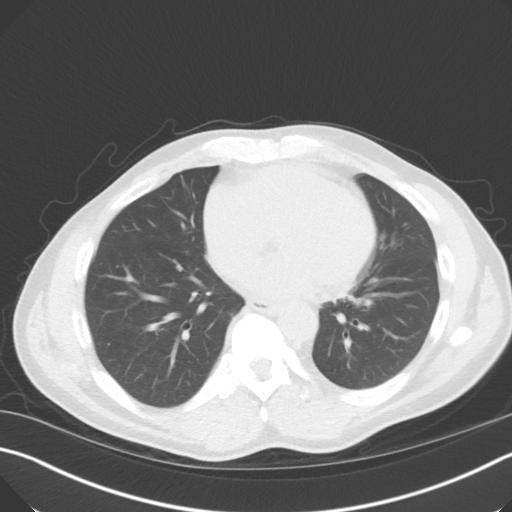
[im 29/63  lung]
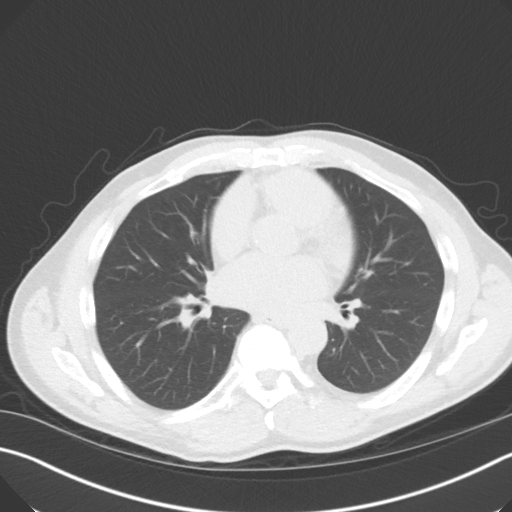
[im 34/63  lung]
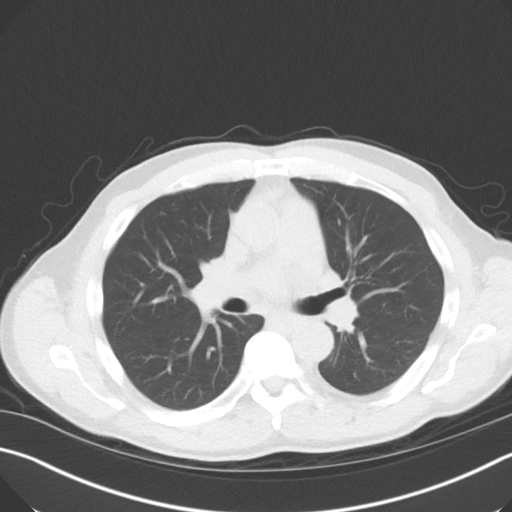
[im 39/63  lung]
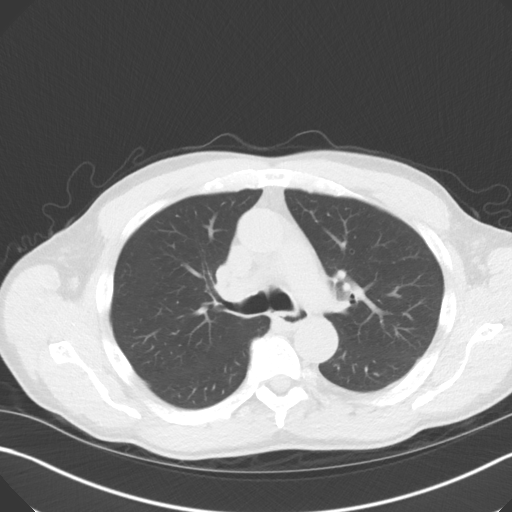
[im 44/63  mediastinal]
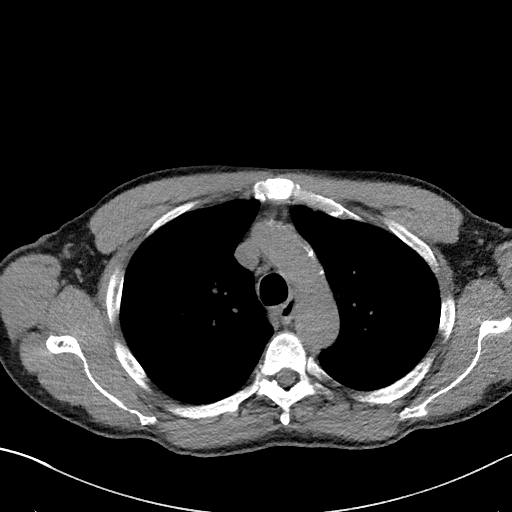
[im 44/63  lung]
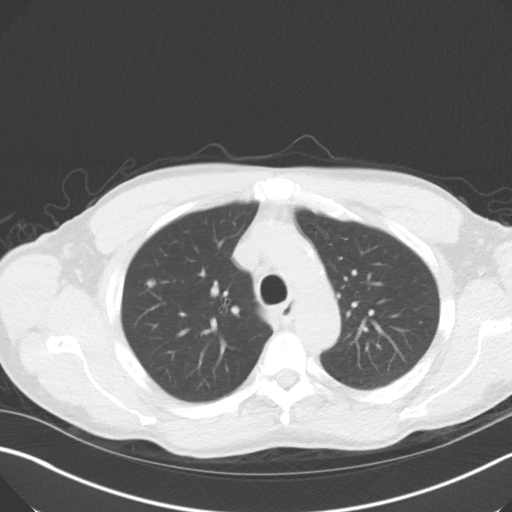
[im 50/63  lung]
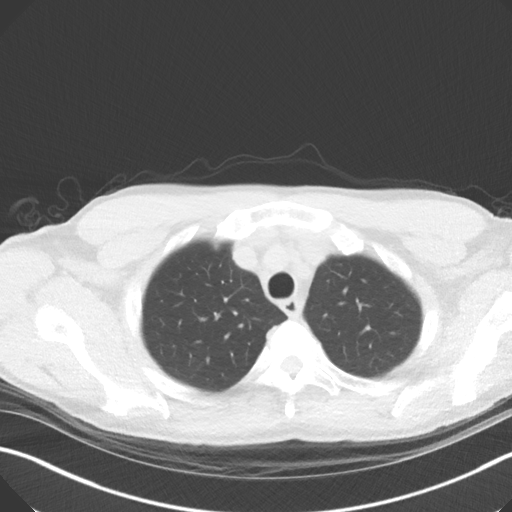
[im 55/63  lung]
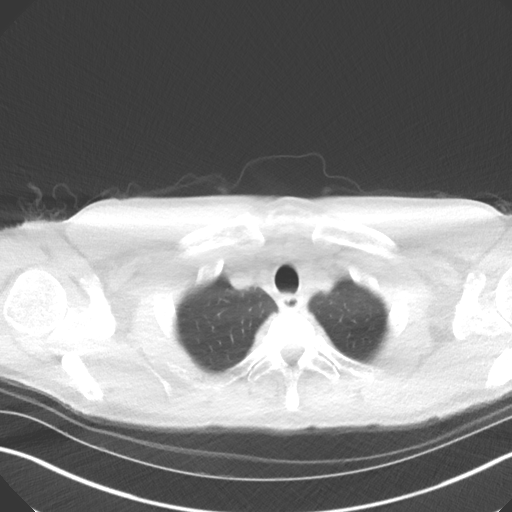
[im 60/63  lung]
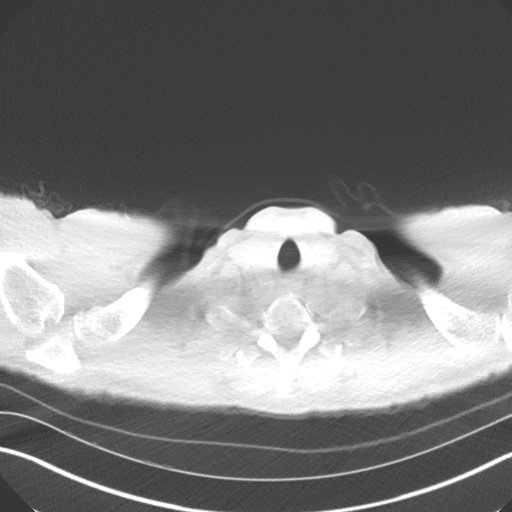

[Series 5: coronal · coronal · 0.62mm/px · 3 of 110 slices shown]
[im 22/110  lung]
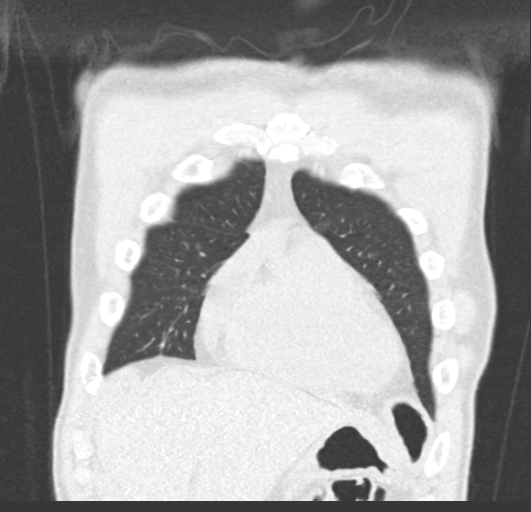
[im 44/110  lung]
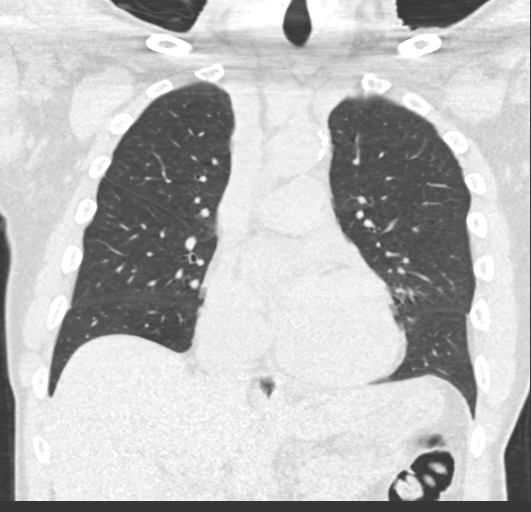
[im 66/110  lung]
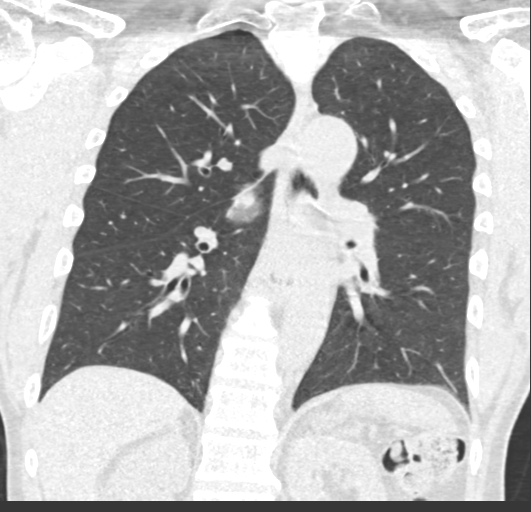

[15 of 40 positions shown; findings below may reference images not displayed]

FINDINGS: Cardiovascular: The heart is normal in size. No pericardial
effusion.

No evidence of thoracic aortic aneurysm. Mild atherosclerotic
calcifications of the aortic arch.

Mediastinum/Nodes: Small mediastinal lymph nodes which do not meet
pathologic CT size criteria.

Visualized thyroid is grossly unremarkable.

Lungs/Pleura: Mild centrilobular emphysematous changes, upper lobe
predominant.

No focal consolidation.

5.9 mm central right upper lobe pulmonary nodule.

No pleural effusion or pneumothorax.

Upper Abdomen: Visualized upper abdomen is grossly unremarkable.

Musculoskeletal: Visualized osseous structures are within normal
limits.
IMPRESSION: Lung-RADS 2, benign appearance or behavior. Continue annual
screening with low-dose chest CT without contrast in 12 months.

Aortic Atherosclerosis ([JL]-[JL]) and Emphysema ([JL]-[JL]).

## 2017-06-17 NOTE — Progress Notes (Signed)
Shared Decision Making Visit Lung Cancer Screening Program [phone])   Eligibility:  Age 72 y.o.  Pack Years Smoking History Calculation 48 pack year smoking history (# packs/per year x # years smoked)  Recent History of coughing up blood  no  Unexplained weight loss? no ( >Than 15 pounds within the last 6 months )  Prior History Lung / other cancer no (Diagnosis within the last 5 years already requiring surveillance chest CT Scans).  Smoking Status Former Smoker  Former Smokers: Years since quit: 3 years  Quit Date: 10/2013  Visit Components:  Discussion included one or more decision making aids. yes  Discussion included risk/benefits of screening. yes  Discussion included potential follow up diagnostic testing for abnormal scans. yes  Discussion included meaning and risk of over diagnosis. yes  Discussion included meaning and risk of False Positives. yes  Discussion included meaning of total radiation exposure. yes  Counseling Included:  Importance of adherence to annual lung cancer LDCT screening. yes  Impact of comorbidities on ability to participate in the program. yes  Ability and willingness to under diagnostic treatment. yes  Smoking Cessation Counseling:  Current Smokers:   Discussed importance of smoking cessation. NA  Information about tobacco cessation classes and interventions provided to patient. yes  Patient provided with "ticket" for LDCT Scan. yes  Symptomatic Patient. no  Counseling  Diagnosis Code: Tobacco Use Z72.0  Asymptomatic Patient yes  Counseling (Intermediate counseling: > three minutes counseling) [id number]  Former Smokers:   Discussed the importance of maintaining cigarette abstinence. yes  Diagnosis Code: Personal History of Nicotine Dependence. P38.250  Information about tobacco cessation classes and interventions provided to patient. Yes  Patient provided with "ticket" for LDCT Scan. yes  Written Order for Lung Cancer  Screening with LDCT placed in Epic. Yes (CT Chest Lung Cancer Screening Low Dose W/O CM) [id number] Z12.2-Screening of respiratory organs Z87.891-Personal history of nicotine dependence  I spent 25 minutes of face to face time with Mr. Cazarez discussing the risks and benefits of lung cancer screening. We viewed a power point together that explained in detail the above noted topics. We took the time to pause the power point at intervals to allow for questions to be asked and answered to ensure understanding. We discussed that he had taken the single most powerful action possible to decrease his risk of developing lung cancer when he quit smoking. I counseled him to remain smoke free, and to contact me if he ever had the desire to smoke again so that I can provide resources and tools to help support the effort to remain smoke free. We discussed the time and location of the scan, and that either  Doroteo Glassman RN or I will call with the results within  24-48 hours of receiving them. He has my card and contact information in the event he needs to speak with me, in addition to a copy of the power point we reviewed as a resource. He verbalized understanding of all of the above and had no further questions upon leaving the office.     I explained to the patient that there has been a high incidence of coronary artery disease noted on these exams. I explained that this is a non-gated exam therefore degree or severity cannot be determined. This patient is not currently on statin therapy. I have asked the patient to follow-up with their PCP regarding any incidental finding of coronary artery disease and management with diet or medication as they feel is  clinically indicated. The patient verbalized understanding of the above and had no further questions.     Magdalen Spatz, NP 06/17/2017

## 2017-06-18 ENCOUNTER — Ambulatory Visit (INDEPENDENT_AMBULATORY_CARE_PROVIDER_SITE_OTHER): Payer: Medicare Other | Admitting: Podiatry

## 2017-06-18 ENCOUNTER — Encounter: Payer: Self-pay | Admitting: Podiatry

## 2017-06-18 DIAGNOSIS — M79672 Pain in left foot: Secondary | ICD-10-CM

## 2017-06-18 DIAGNOSIS — B351 Tinea unguium: Secondary | ICD-10-CM | POA: Diagnosis not present

## 2017-06-18 DIAGNOSIS — L600 Ingrowing nail: Secondary | ICD-10-CM | POA: Diagnosis not present

## 2017-06-18 DIAGNOSIS — M79671 Pain in right foot: Secondary | ICD-10-CM

## 2017-06-18 NOTE — Patient Instructions (Signed)
Seen for hypertrophic nails. Interdigital corns on left great toe debrided. All nails debrided. Return in 3 months or as needed.

## 2017-06-18 NOTE — Progress Notes (Signed)
Subjective: 72 y.o. year old male patient presents for painful left great toe. Stated that he kept fresh cotton in between toe on left foot. Used to have painful ingrown nails. Patient does not know his blood sugar level. Newly diagnosed diabetic since November 2018.  Objective: Dermatologic: Thick yellow deformed nails x 10. Ingrown left great toe medial border without drainage. Painful interdigital corn lateral aspect left great toe at contact surgery with the 2nd toe left foot. Painful plantar porokeratosis under left great toe. Vascular: Pedal pulses are all palpable. Orthopedic: Severe Hallux valgus with bunion bilateral. Severe interdigital pressure at contact surface between the first and 2nd toe left foot with pain. Neurologic: All epicritic and tactile sensations grossly intact.  Assessment: Dystrophic mycotic nails x 10. Ingrown left hallucal nail. Excess interdigital pressure with pre ulcerative digital lesion left 1st and 2nd toe. Porokeratosis left great toe.  Treatment: All mycotic nails, corns, calluses debrided.  Left great toe bleeding medial border cleansed with Iodine and compression bandage applied with home care instruction. Continue with interdigital padding.  Return in 3 months or as needed.

## 2017-07-01 ENCOUNTER — Encounter: Payer: Self-pay | Admitting: *Deleted

## 2017-07-01 ENCOUNTER — Other Ambulatory Visit: Payer: Self-pay | Admitting: Acute Care

## 2017-07-01 DIAGNOSIS — Z122 Encounter for screening for malignant neoplasm of respiratory organs: Secondary | ICD-10-CM

## 2017-07-01 DIAGNOSIS — Z87891 Personal history of nicotine dependence: Secondary | ICD-10-CM

## 2017-07-21 ENCOUNTER — Telehealth: Payer: Self-pay | Admitting: Physician Assistant

## 2017-07-21 NOTE — Telephone Encounter (Signed)
Called pt to try and reschedule the 4/25 appt he has scheduled with [name]. Stephanie's last day will be April 27th. He will need to reschedule with a different provider at his convenience.  Thanks!

## 2017-08-05 ENCOUNTER — Encounter: Payer: Self-pay | Admitting: Physician Assistant

## 2017-08-12 ENCOUNTER — Other Ambulatory Visit: Payer: Self-pay | Admitting: Physician Assistant

## 2017-08-12 ENCOUNTER — Telehealth: Payer: Self-pay | Admitting: Physician Assistant

## 2017-08-12 DIAGNOSIS — E119 Type 2 diabetes mellitus without complications: Secondary | ICD-10-CM

## 2017-08-12 DIAGNOSIS — I10 Essential (primary) hypertension: Secondary | ICD-10-CM

## 2017-08-12 MED ORDER — LISINOPRIL 10 MG PO TABS: 10.0000 mg | ORAL_TABLET | Freq: Every day | ORAL | 1 refills | Status: DC

## 2017-08-12 NOTE — Telephone Encounter (Signed)
Copied from [place] [id number]. Topic: Quick Communication - Rx Refill/Question >> Aug 12, 2017  1:30 PM Boyd Kerbs wrote:  Medication: lisinopril (PRINIVIL,ZESTRIL) 10 MG tablet He has 4 pills left   Has the patient contacted their pharmacy? Yes.    (Agent: If no, request that the patient contact the pharmacy for the refill.) Preferred Pharmacy (with phone number or street name):   [facility] [phone] - [address] [place] [address] Phone: [phone] Fax: [phone] Not a 24 hour pharmacy; exact hours not known   Agent: Please be advised that RX refills may take up to 3 business days. We ask that you follow-up with your pharmacy.

## 2017-08-27 ENCOUNTER — Ambulatory Visit: Payer: Medicare Other | Admitting: Physician Assistant

## 2017-09-01 ENCOUNTER — Ambulatory Visit: Payer: Medicare Other | Admitting: Urgent Care

## 2017-09-03 ENCOUNTER — Encounter: Payer: Self-pay | Admitting: Urgent Care

## 2017-09-03 ENCOUNTER — Ambulatory Visit (INDEPENDENT_AMBULATORY_CARE_PROVIDER_SITE_OTHER): Payer: Medicare Other | Admitting: Urgent Care

## 2017-09-03 ENCOUNTER — Other Ambulatory Visit: Payer: Self-pay

## 2017-09-03 VITALS — BP 144/74 | HR 56 | Temp 97.6°F | Ht 67.0 in | Wt 159.8 lb

## 2017-09-03 DIAGNOSIS — Z9109 Other allergy status, other than to drugs and biological substances: Secondary | ICD-10-CM

## 2017-09-03 DIAGNOSIS — E119 Type 2 diabetes mellitus without complications: Secondary | ICD-10-CM

## 2017-09-03 DIAGNOSIS — I10 Essential (primary) hypertension: Secondary | ICD-10-CM | POA: Diagnosis not present

## 2017-09-03 LAB — GLUCOSE, POCT (MANUAL RESULT ENTRY): POC Glucose: 105 mg/dl — AB (ref 70–99)

## 2017-09-03 LAB — POCT GLYCOSYLATED HEMOGLOBIN (HGB A1C): Hemoglobin A1C: 6.8

## 2017-09-03 MED ORDER — CETIRIZINE HCL 10 MG PO TABS: 10.0000 mg | ORAL_TABLET | Freq: Every day | ORAL | 11 refills | Status: DC

## 2017-09-03 MED ORDER — LISINOPRIL 10 MG PO TABS: 10.0000 mg | ORAL_TABLET | Freq: Every day | ORAL | 1 refills | Status: DC

## 2017-09-03 MED ORDER — METFORMIN HCL 500 MG PO TABS: 500.0000 mg | ORAL_TABLET | Freq: Every day | ORAL | 1 refills | Status: DC

## 2017-09-03 MED ORDER — FLUTICASONE PROPIONATE 50 MCG/ACT NA SUSP: 2.0000 | Freq: Every day | NASAL | 11 refills | Status: DC

## 2017-09-03 NOTE — Progress Notes (Signed)
    MRN: [id number] DOB: [birth year]  Subjective:   Benjamin Willis. is a 71 y.o. male presenting for follow up on Hypertension. Currently managed with lisinopril 10 mg. Denies dizziness, chronic headache, blurred vision, chest pain, shortness of breath, heart racing, palpitations, nausea, vomiting, abdominal pain, hematuria, lower leg swelling. Denies smoking cigarettes or drinking alcohol.  He is also taking metformin once a day for his diabetes.  Denies polydipsia, polyuria, skin infections.  Benjamin Willis has a current medication list which includes the following prescription(s): multivitamin men, cetirizine, fluticasone, lisinopril, and metformin. Also has No Known Allergies.  [name]  has a past medical history of Allergy, Diabetes mellitus without complication ([place]), and Hypertension. Also  has no past surgical history on file.  Objective:   Vitals: BP (!) 144/74 (BP Location: Left Arm, Patient Position: Sitting) Comment: This is a recheck BP  Pulse (!) 56   Temp 97.6 F (36.4 C) (Oral)   Ht 5\' 7"  (1.702 m)   Wt 159 lb 12.8 oz (72.5 kg)   SpO2 100%   BMI 25.03 kg/m   Physical Exam  Constitutional: He is oriented to person, place, and time. He appears well-developed and well-nourished.  HENT:  Mouth/Throat: Oropharynx is clear and moist.  Eyes: Pupils are equal, round, and reactive to light. EOM are normal. Right eye exhibits no discharge. Left eye exhibits no discharge. No scleral icterus.  Cardiovascular: Normal rate, regular rhythm and intact distal pulses. Exam reveals no gallop and no friction rub.  No murmur heard. Pulmonary/Chest: No stridor. No respiratory distress. He has no wheezes. He has no rales.  Abdominal: Soft. Bowel sounds are normal. He exhibits no distension and no mass. There is no tenderness. There is no rebound and no guarding.  Neurological: He is alert and oriented to person, place, and time.  Skin: Skin is warm and dry.  Psychiatric: He has a normal mood and  affect.    Results for orders placed or performed in visit on 09/03/17 (from the past 24 hour(s))  POCT glucose (manual entry)     Status: Abnormal   Collection Time: 09/03/17 11:47 AM  Result Value Ref Range   POC Glucose 105 (A) 70 - 99 mg/dl  POCT glycosylated hemoglobin (Hb A1C)     Status: None   Collection Time: 09/03/17 11:54 AM  Result Value Ref Range   Hemoglobin A1C 6.8     Assessment and Plan :   Type 2 diabetes mellitus without complication, without long-term current use of insulin (HCC) - Plan: POCT glycosylated hemoglobin (Hb A1C), POCT glucose (manual entry), Comprehensive metabolic panel, Lipid panel, Microalbumin / creatinine urine ratio, CANCELED: Urine Microscopic  Essential hypertension - Plan: Comprehensive metabolic panel, Lipid panel, Microalbumin / creatinine urine ratio, lisinopril (PRINIVIL,ZESTRIL) 10 MG tablet  Environmental allergies  Refilled his metformin to 500 mg once daily.  Also refilled his lisinopril 10 mg once daily.  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  I recommend he start Zyrtec and Flonase daily.  Patient is okay to follow-up in 6 months.  Okay to refill his medications up until then.  Jaynee Eagles, PA-C [facility] [phone] 09/03/2017  12:10 PM

## 2017-09-03 NOTE — Patient Instructions (Signed)
Type 2 Diabetes Mellitus, Self Care, Adult When you have type 2 diabetes (type 2 diabetes mellitus), you must keep your blood sugar (glucose) under control. You can do this with:  Nutrition.  Exercise.  Lifestyle changes.  Medicines or insulin, if needed.  Support from your doctors and others.  How do I manage my blood sugar?  Check your blood sugar level every day, as often as told.  Call your doctor if your blood sugar is above your goal numbers for 2 tests in a row.  Have your A1c (hemoglobin A1c) level checked at least two times a year. Have it checked more often if your doctor tells you to. Your doctor will set treatment goals for you. Generally, you should have these blood sugar levels:  Before meals (preprandial): 80-130 mg/dL (4.4-7.2 mmol/L).  After meals (postprandial): lower than 180 mg/dL (10 mmol/L).  A1c level: less than 7%.  What do I need to know about high blood sugar? High blood sugar is called hyperglycemia. Know the signs of high blood sugar. Signs may include:  Feeling: ? Thirsty. ? Hungry. ? Very tired.  Needing to pee (urinate) more than usual.  Blurry vision.  What do I need to know about low blood sugar? Low blood sugar is called hypoglycemia. This is when blood sugar is at or below 70 mg/dL (3.9 mmol/L). Symptoms may include:  Feeling: ? Hungry. ? Worried or nervous (anxious). ? Sweaty and clammy. ? Confused. ? Dizzy. ? Sleepy. ? Sick to your stomach (nauseous).  Having: ? A fast heartbeat (palpitations). ? A headache. ? A change in your vision. ? Jerky movements that you cannot control (seizure). ? Nightmares. ? Tingling or no feeling (numbness) around the mouth, lips, or tongue.  Having trouble with: ? Talking. ? Paying attention (concentrating). ? Moving (coordination). ? Sleeping.  Shaking.  Passing out (fainting).  Getting upset easily (irritability).  Treating low blood sugar  To treat low blood sugar, eat or  drink something sugary right away. If you can think clearly and swallow safely, follow the 15:15 rule:  Take 15 grams of a fast-acting carb (carbohydrate). Some fast-acting carbs are: ? 1 tube of glucose gel. ? 3 sugar tablets (glucose pills). ? 6-8 pieces of hard candy. ? 4 oz (120 mL) of fruit juice. ? 4 oz (120 mL) regular (not diet) soda.  Check your blood sugar 15 minutes after you take the carb.  If your blood sugar is still at or below 70 mg/dL (3.9 mmol/L), take 15 grams of a carb again.  If your blood sugar does not go above 70 mg/dL (3.9 mmol/L) after 3 tries, get help right away.  After your blood sugar goes back to normal, eat a meal or a snack within 1 hour.  Treating very low blood sugar If your blood sugar is at or below 54 mg/dL (3 mmol/L), you have very low blood sugar (severe hypoglycemia). This is an emergency. Do not wait to see if the symptoms will go away. Get medical help right away. Call your local emergency services (911 in the [place] Do not drive yourself to the hospital. If you have very low blood sugar and you cannot eat or drink, you may need a glucagon shot (injection). A family member or friend should learn how to check your blood sugar and how to give you a glucagon shot. Ask your doctor if you need to have a glucagon shot kit at home. What else is important to manage my diabetes? Medicine  Follow these instructions about insulin and diabetes medicines:  Take them as told by your doctor.  Adjust them as told by your doctor.  Do not run out of them.  Having diabetes can raise your risk for other long-term conditions. These include heart or kidney disease. Your doctor may prescribe medicines to help prevent problems from diabetes. Food   Make healthy food choices. These include: ? Chicken, fish, egg whites, and beans. ? Oats, whole wheat, bulgur, brown rice, quinoa, and millet. ? Fresh fruits and vegetables. ? Low-fat dairy products. ? Nuts,  avocado, olive oil, and canola oil.  Make a food plan with a specialist (dietitian).  Follow instructions from your doctor about what you cannot eat or drink.  Drink enough fluid to keep your pee (urine) clear or pale yellow.  Eat healthy snacks between healthy meals.  Keep track of carbs that you eat. Read food labels. Learn food serving sizes.  Follow your sick day plan when you cannot eat or drink normally. Make this plan with your doctor so it is ready to use. Activity  Exercise at least 3 times a week.  Do not go more than 2 days without exercising.  Talk with your doctor before you start a new exercise. Your doctor may need to adjust your insulin, medicines, or food. Lifestyle   Do not use any tobacco products. These include cigarettes, chewing tobacco, and e-cigarettes.If you need help quitting, ask your doctor.  Ask your doctor how much alcohol is safe for you.  Learn to deal with stress. If you need help with this, ask your doctor. Body care  Stay up to date with your shots (immunizations).  Have your eyes and feet checked by a doctor as often as told.  Check your skin and feet every day. Check for cuts, bruises, redness, blisters, or sores.  Brush your teeth and gums two times a day.  Floss at least one time a day.  Go to the dentist least one time every 6 months.  Stay at a healthy weight. General instructions   Take over-the-counter and prescription medicines only as told by your doctor.  Share your diabetes care plan with: ? Your work or school. ? People you live with.  Check your pee (urine) for ketones: ? When you are sick. ? As told by your doctor.  Carry a card or wear jewelry that says that you have diabetes.  Ask your doctor: ? Do I need to meet with a diabetes educator? ? Where can I find a support group for people with diabetes?  Keep all follow-up visits as told by your doctor. This is important. Where to find more information: To  learn more about diabetes, visit:  American Diabetes Association: www.diabetes.org  American Association of Diabetes Educators: www.diabeteseducator.org/patient-resources  This information is not intended to replace advice given to you by your health care provider. Make sure you discuss any questions you have with your health care provider. Document Released: 08/13/2015 Document Revised: 09/27/2015 Document Reviewed: 05/25/2015 Elsevier Interactive Patient Education  2018 [employer].     IF you received an x-ray today, you will receive an invoice from [facility]. Please contact [facility] at [phone] with questions or concerns regarding your invoice.   IF you received labwork today, you will receive an invoice from [facility]. Please contact [facility] at [phone] with questions or concerns regarding your invoice.   Our billing staff will not be able to assist you with questions regarding bills from these companies.  You will be contacted with the lab results as soon as they are available. The fastest way to get your results is to activate your My Chart account. Instructions are located on the last page of this paperwork. If you have not heard from us regarding the results in 2 weeks, please contact this office.      

## 2017-09-04 LAB — COMPREHENSIVE METABOLIC PANEL
A/G RATIO: 1.8 (ref 1.2–2.2)
ALK PHOS: 87 IU/L (ref 39–117)
ALT: 25 IU/L (ref 0–44)
AST: 18 IU/L (ref 0–40)
Albumin: 4.2 g/dL (ref 3.5–4.8)
BUN/Creatinine Ratio: 19 (ref 10–24)
BUN: 14 mg/dL (ref 8–27)
Bilirubin Total: 0.5 mg/dL (ref 0.0–1.2)
CALCIUM: 9 mg/dL (ref 8.6–10.2)
CO2: 25 mmol/L (ref 20–29)
Chloride: 108 mmol/L — ABNORMAL HIGH (ref 96–106)
Creatinine, Ser: 0.72 mg/dL — ABNORMAL LOW (ref 0.76–1.27)
GFR calc Af Amer: 109 mL/min/{1.73_m2} (ref >59)
GFR, EST NON AFRICAN AMERICAN: 95 mL/min/{1.73_m2} (ref >59)
GLOBULIN, TOTAL: 2.3 g/dL (ref 1.5–4.5)
Glucose: 104 mg/dL — ABNORMAL HIGH (ref 65–99)
POTASSIUM: 4.1 mmol/L (ref 3.5–5.2)
SODIUM: 147 mmol/L — AB (ref 134–144)
Total Protein: 6.5 g/dL (ref 6.0–8.5)

## 2017-09-04 LAB — LIPID PANEL
CHOL/HDL RATIO: 3.4 ratio (ref 0.0–5.0)
CHOLESTEROL TOTAL: 192 mg/dL (ref 100–199)
HDL: 56 mg/dL (ref >39)
LDL Calculated: 125 mg/dL — ABNORMAL HIGH (ref 0–99)
TRIGLYCERIDES: 57 mg/dL (ref 0–149)
VLDL Cholesterol Cal: 11 mg/dL (ref 5–40)

## 2017-09-04 LAB — MICROALBUMIN / CREATININE URINE RATIO
Creatinine, Urine: 189.6 mg/dL
MICROALB/CREAT RATIO: 11.6 mg/g{creat} (ref 0.0–30.0)
Microalbumin, Urine: 22 ug/mL

## 2017-09-11 ENCOUNTER — Encounter: Payer: Self-pay | Admitting: Urgent Care

## 2017-09-11 ENCOUNTER — Other Ambulatory Visit: Payer: Self-pay | Admitting: Urgent Care

## 2017-09-11 MED ORDER — ATORVASTATIN CALCIUM 20 MG PO TABS: 20.0000 mg | ORAL_TABLET | Freq: Every day | ORAL | 3 refills | Status: DC

## 2017-09-15 ENCOUNTER — Ambulatory Visit: Payer: Medicare Other | Admitting: Podiatry

## 2017-12-11 ENCOUNTER — Telehealth: Payer: Self-pay | Admitting: Urgent Care

## 2017-12-11 NOTE — Telephone Encounter (Signed)
Called pt to reschedule their appt 03/16/18. Left message with sister per [facility]

## 2018-01-19 LAB — HM DIABETES EYE EXAM

## 2018-02-09 ENCOUNTER — Encounter: Payer: Self-pay | Admitting: *Deleted

## 2018-03-12 ENCOUNTER — Other Ambulatory Visit: Payer: Self-pay | Admitting: Urgent Care

## 2018-03-12 NOTE — Telephone Encounter (Signed)
Pt need OV for more refills.

## 2018-03-16 ENCOUNTER — Ambulatory Visit: Payer: Medicare Other | Admitting: Urgent Care

## 2018-04-20 ENCOUNTER — Other Ambulatory Visit: Payer: Self-pay | Admitting: Family Medicine

## 2018-04-20 NOTE — Telephone Encounter (Signed)
Refill request for metformin; last office visit 09/03/17 with Jaynee Eagles; no upcoming visits noted; pt also needs transfer of care appointment;  last refill 03/12/18; left message at [phone]; will grant 30 day courtesy refill to cover pt; pt needs office visit for additional refills. Requested Prescriptions  Pending Prescriptions Disp Refills  . metFORMIN (GLUCOPHAGE) 500 MG tablet [Pharmacy Med Name: metFORMIN HCl 500 MG Oral Tablet] 30 tablet 0    Sig: TAKE 1 TABLET BY MOUTH ONCE DAILY WITH BREAKFAST     Endocrinology:  Diabetes - Biguanides Failed - 04/20/2018  3:24 PM      Failed - Cr in normal range and within 360 days    Creatinine, Ser  Date Value Ref Range Status  09/03/2017 0.72 (L) 0.76 - 1.27 mg/dL Final         Failed - HBA1C is between 0 and 7.9 and within 180 days    Hemoglobin A1C  Date Value Ref Range Status  09/03/2017 6.8  Final         Failed - Valid encounter within last 6 months    Recent Outpatient Visits          7 months ago Type 2 diabetes mellitus without complication, without long-term current use of insulin ([place])   [facility] at [place], PA-C   10 months ago Diabetes mellitus, new onset [facility])   Primary [facility] at [place], [place], [place]   1 year ago Diabetes mellitus, new onset [facility])   Primary Care at [place], [place], [place]   1 year ago New onset type 2 diabetes mellitus [facility])   Primary Care at [place], [place], [place]   1 year ago Influenza A   Primary Care at [place], [place], [place]      Future Appointments            In 1 month Sagardia, Ines Bloomer, MD [facility] at [place], [facility]   In 1 month  Primary Care at [place], [place] - eGFR in normal range and within 360 days    GFR calc Af Amer  Date Value Ref Range Status  09/03/2017 109 >59 mL/min/1.73 Final   GFR calc non Af Amer  Date Value Ref Range Status  09/03/2017 95 >59 mL/min/1.73 Final

## 2018-04-20 NOTE — Telephone Encounter (Signed)
Benjamin Willis, pts sister, called about metformin. Pt is out. Appt is scheduled for 05/24/2018 with Dr. Mitchel Honour to transfer from [place], [place]. Pt requesting refills until this appt. Please advise as pt is out of medicine.  Phone # [phone] - ok to leave msg if no answer  [place] ([place]), [place] [facility] [place]

## 2018-05-24 ENCOUNTER — Ambulatory Visit (INDEPENDENT_AMBULATORY_CARE_PROVIDER_SITE_OTHER): Payer: Medicare Other | Admitting: Emergency Medicine

## 2018-05-24 ENCOUNTER — Other Ambulatory Visit: Payer: Self-pay

## 2018-05-24 ENCOUNTER — Encounter: Payer: Self-pay | Admitting: Emergency Medicine

## 2018-05-24 VITALS — BP 184/67 | HR 57 | Temp 97.9°F | Resp 20 | Ht 66.81 in | Wt 161.8 lb

## 2018-05-24 DIAGNOSIS — E1159 Type 2 diabetes mellitus with other circulatory complications: Secondary | ICD-10-CM | POA: Insufficient documentation

## 2018-05-24 DIAGNOSIS — I10 Essential (primary) hypertension: Secondary | ICD-10-CM | POA: Diagnosis not present

## 2018-05-24 DIAGNOSIS — E119 Type 2 diabetes mellitus without complications: Secondary | ICD-10-CM | POA: Diagnosis not present

## 2018-05-24 DIAGNOSIS — E785 Hyperlipidemia, unspecified: Secondary | ICD-10-CM | POA: Diagnosis not present

## 2018-05-24 MED ORDER — LISINOPRIL 20 MG PO TABS: 20.0000 mg | ORAL_TABLET | Freq: Every day | ORAL | 3 refills | Status: DC

## 2018-05-24 MED ORDER — ATORVASTATIN CALCIUM 20 MG PO TABS: 20.0000 mg | ORAL_TABLET | Freq: Every day | ORAL | 3 refills | Status: DC

## 2018-05-24 MED ORDER — METFORMIN HCL 500 MG PO TABS: ORAL_TABLET | ORAL | 3 refills | Status: DC

## 2018-05-24 NOTE — Assessment & Plan Note (Signed)
Uncontrolled blood pressure.  Will increase lisinopril to 20 mg a day.  Recheck in 6 months.

## 2018-05-24 NOTE — Progress Notes (Signed)
Lab Results  Component Value Date   CHOL 192 09/03/2017   HDL 56 09/03/2017   LDLCALC 125 (H) 09/03/2017   TRIG 57 09/03/2017   CHOLHDL 3.4 09/03/2017   BP Readings from Last 3 Encounters:  05/24/18 (!) 192/82  09/03/17 (!) 144/74  05/29/17 (!) 168/62   Lab Results  Component Value Date   HGBA1C 6.8 09/03/2017   Benjamin Florina Ou. 73 y.o.   Chief Complaint  Patient presents with  . Establish Care  . Medication Refill    lipitor, lisinopril, metformin    HISTORY OF PRESENT ILLNESS: This is a 73 y.o. male with history of hypertension, diabetes, and high cholesterol here for follow-up and to establish care with me.  Also needs medication refills please has no complaints or medical concerns today.  HPI   Prior to Admission medications   Medication Sig Start Date End Date Taking? Authorizing Provider  atorvastatin (LIPITOR) 20 MG tablet Take 1 tablet (20 mg total) by mouth daily. 09/11/17  Yes Jaynee Eagles, PA-C  lisinopril (PRINIVIL,ZESTRIL) 10 MG tablet Take 1 tablet (10 mg total) by mouth daily. 09/03/17  Yes Jaynee Eagles, PA-C  metFORMIN (GLUCOPHAGE) 500 MG tablet TAKE 1 TABLET BY MOUTH ONCE DAILY WITH BREAKFAST 04/20/18  Yes Rutherford Guys, MD  Multiple Vitamins-Minerals (MULTIVITAMIN MEN) TABS Take 1 tablet by mouth daily.   Yes [provider]  cetirizine (ZYRTEC) 10 MG tablet Take 1 tablet (10 mg total) by mouth daily. Patient not taking: Reported on 05/24/2018 09/03/17   Jaynee Eagles, PA-C  fluticasone [facility]) 50 MCG/ACT nasal spray Place 2 sprays into both nostrils daily. Patient not taking: Reported on 05/24/2018 09/03/17   Jaynee Eagles, PA-C    No Known Allergies  Patient Active Problem List   Diagnosis Date Noted  . Diabetes ([place]) 06/09/2017  . Acute conjunctivitis of both eyes 01/01/2017  . Elevated blood pressure reading in office without diagnosis of hypertension 01/01/2017  . Screening for endocrine/metabolic/immunity disorders 01/01/2017    Past  Medical History:  Diagnosis Date  . Allergy   . Diabetes mellitus without complication ([place])   . Hypertension     History reviewed. No pertinent surgical history.  Social History   Socioeconomic History  . Marital status: Single    Spouse name: Not on file  . Number of children: 0  . Years of education: Not on file  . Highest education level: 9th grade  Occupational History  . Not on file  Social Needs  . Financial resource strain: Not hard at all  . Food insecurity:    Worry: Never true    Inability: Never true  . Transportation needs:    Medical: No    Non-medical: No  Tobacco Use  . Smoking status: Former Smoker    Packs/day: 1.00    Years: 48.00    Pack years: 48.00    Last attempt to quit: 02/20/2015    Years since quitting: 3.2  . Smokeless tobacco: Former Systems developer    Quit date: 10/2013  Substance and Sexual Activity  . Alcohol use: No    Frequency: Never  . Drug use: No  . Sexual activity: Yes    Birth control/protection: Condom  Lifestyle  . Physical activity:    Days per week: 0 days    Minutes per session: 0 min  . Stress: Not at all  Relationships  . Social connections:    Talks on phone: More than three times a week    Gets together:  More than three times a week    Attends religious service: More than 4 times per year    Active member of club or organization: No    Attends meetings of clubs or organizations: Never    Relationship status: Never married  . Intimate partner violence:    Fear of current or ex partner: No    Emotionally abused: No    Physically abused: No    Forced sexual activity: No  Other Topics Concern  . Not on file  Social History Narrative  . Not on file    History reviewed. No pertinent family history.   Review of Systems  Constitutional: Negative.  Negative for chills, fever and weight loss.  HENT: Negative.   Eyes: Negative.  Negative for blurred vision and double vision.  Respiratory: Negative.  Negative for cough  and shortness of breath.   Cardiovascular: Negative.  Negative for chest pain, palpitations and leg swelling.  Gastrointestinal: Negative.  Negative for abdominal pain, nausea and vomiting.  Genitourinary: Negative.  Negative for dysuria.  Musculoskeletal: Negative.   Skin: Negative.  Negative for rash.  Neurological: Negative.  Negative for dizziness and headaches.  Endo/Heme/Allergies: Negative.   All other systems reviewed and are negative.  Vitals:   05/24/18 0813 05/24/18 0817  BP: (!) 192/82 (!) 184/67  Pulse: (!) 57   Resp: 20   Temp: 97.9 F (36.6 C)   SpO2: 98%      Physical Exam Vitals signs reviewed.  Constitutional:      Appearance: Normal appearance.  HENT:     Head: Normocephalic and atraumatic.     Nose: Nose normal.     Mouth/Throat:     Mouth: Mucous membranes are dry.     Pharynx: Oropharynx is clear.  Eyes:     Extraocular Movements: Extraocular movements intact.     Conjunctiva/sclera: Conjunctivae normal.     Pupils: Pupils are equal, round, and reactive to light.  Neck:     Musculoskeletal: Normal range of motion and neck supple.     Vascular: No carotid bruit.  Cardiovascular:     Rate and Rhythm: Normal rate and regular rhythm.     Pulses: Normal pulses.     Heart sounds: Normal heart sounds.  Pulmonary:     Effort: Pulmonary effort is normal.  Abdominal:     General: Abdomen is flat.     Palpations: Abdomen is soft.     Tenderness: There is no abdominal tenderness.  Musculoskeletal: Normal range of motion.  Skin:    General: Skin is warm and dry.     Capillary Refill: Capillary refill takes less than 2 seconds.  Neurological:     General: No focal deficit present.     Mental Status: He is alert and oriented to person, place, and time.  Psychiatric:        Mood and Affect: Mood normal.        Behavior: Behavior normal.      ASSESSMENT & PLAN: Essential hypertension Uncontrolled blood pressure.  Will increase lisinopril to 20 mg a  day.  Recheck in 6 months.  Benjamin Willis was seen today for establish care and medication refill.  Diagnoses and all orders for this visit:  Essential hypertension -     lisinopril (PRINIVIL,ZESTRIL) 20 MG tablet; Take 1 tablet (20 mg total) by mouth daily. -     Comprehensive metabolic panel  Type 2 diabetes mellitus without complication, without long-term current use of insulin ([place]) -  metFORMIN (GLUCOPHAGE) 500 MG tablet; TAKE 1 TABLET BY MOUTH ONCE DAILY WITH BREAKFAST -     Comprehensive metabolic panel -     Hemoglobin A1c  Dyslipidemia -     atorvastatin (LIPITOR) 20 MG tablet; Take 1 tablet (20 mg total) by mouth daily. -     Lipid panel    Patient Instructions       If you have lab work done today you will be contacted with your lab results within the next 2 weeks.  If you have not heard from [place] then please contact us. The fastest way to get your results is to register for My Chart.   IF you received an x-ray today, you will receive an invoice from [facility]. Please contact [facility] at [phone] with questions or concerns regarding your invoice.   IF you received labwork today, you will receive an invoice from [place]. Please contact [facility] at [phone] with questions or concerns regarding your invoice.   Our billing staff will not be able to assist you with questions regarding bills from these companies.  You will be contacted with the lab results as soon as they are available. The fastest way to get your results is to activate your My Chart account. Instructions are located on the last page of this paperwork. If you have not heard from [place] regarding the results in 2 weeks, please contact this office.     Hypertension Hypertension, commonly called high blood pressure, is when the force of blood pumping through the arteries is too strong. The arteries are the blood vessels that carry blood from the heart throughout the body. Hypertension  forces the heart to work harder to pump blood and may cause arteries to become narrow or stiff. Having untreated or uncontrolled hypertension can cause heart attacks, strokes, kidney disease, and other problems. A blood pressure reading consists of a higher number over a lower number. Ideally, your blood pressure should be below 120/80. The first ("top") number is called the systolic pressure. It is a measure of the pressure in your arteries as your heart beats. The second ("bottom") number is called the diastolic pressure. It is a measure of the pressure in your arteries as the heart relaxes. What are the causes? The cause of this condition is not known. What increases the risk? Some risk factors for high blood pressure are under your control. Others are not. Factors you can change  Smoking.  Having type 2 diabetes mellitus, high cholesterol, or both.  Not getting enough exercise or physical activity.  Being overweight.  Having too much fat, sugar, calories, or salt (sodium) in your diet.  Drinking too much alcohol. Factors that are difficult or impossible to change  Having chronic kidney disease.  Having a family history of high blood pressure.  Age. Risk increases with age.  Race. You may be at higher risk if you are African-American.  Gender. Men are at higher risk than women before age 88. After age 5, women are at higher risk than men.  Having obstructive sleep apnea.  Stress. What are the signs or symptoms? Extremely high blood pressure (hypertensive crisis) may cause:  Headache.  Anxiety.  Shortness of breath.  Nosebleed.  Nausea and vomiting.  Severe chest pain.  Jerky movements you cannot control (seizures). How is this diagnosed? This condition is diagnosed by measuring your blood pressure while you are seated, with your arm resting on a surface. The cuff of the blood pressure monitor will be  placed directly against the skin of your upper arm at the  level of your heart. It should be measured at least twice using the same arm. Certain conditions can cause a difference in blood pressure between your right and left arms. Certain factors can cause blood pressure readings to be lower or higher than normal (elevated) for a short period of time:  When your blood pressure is higher when you are in a health care provider's office than when you are at home, this is called white coat hypertension. Most people with this condition do not need medicines.  When your blood pressure is higher at home than when you are in a health care provider's office, this is called masked hypertension. Most people with this condition may need medicines to control blood pressure. If you have a high blood pressure reading during one visit or you have normal blood pressure with other risk factors:  You may be asked to return on a different day to have your blood pressure checked again.  You may be asked to monitor your blood pressure at home for 1 week or longer. If you are diagnosed with hypertension, you may have other blood or imaging tests to help your health care provider understand your overall risk for other conditions. How is this treated? This condition is treated by making healthy lifestyle changes, such as eating healthy foods, exercising more, and reducing your alcohol intake. Your health care provider may prescribe medicine if lifestyle changes are not enough to get your blood pressure under control, and if:  Your systolic blood pressure is above 130.  Your diastolic blood pressure is above 80. Your personal target blood pressure may vary depending on your medical conditions, your age, and other factors. Follow these instructions at home: Eating and drinking   Eat a diet that is high in fiber and potassium, and low in sodium, added sugar, and fat. An example eating plan is called the DASH (Dietary Approaches to Stop Hypertension) diet. To eat this way: ? Eat  plenty of fresh fruits and vegetables. Try to fill half of your plate at each meal with fruits and vegetables. ? Eat whole grains, such as whole wheat pasta, brown rice, or whole grain bread. Fill about one quarter of your plate with whole grains. ? Eat or drink low-fat dairy products, such as skim milk or low-fat yogurt. ? Avoid fatty cuts of meat, processed or cured meats, and poultry with skin. Fill about one quarter of your plate with lean proteins, such as fish, chicken without skin, beans, eggs, and tofu. ? Avoid premade and processed foods. These tend to be higher in sodium, added sugar, and fat.  Reduce your daily sodium intake. Most people with hypertension should eat less than 1,500 mg of sodium a day.  Limit alcohol intake to no more than 1 drink a day for nonpregnant women and 2 drinks a day for men. One drink equals 12 oz of beer, 5 oz of wine, or 1 oz of hard liquor. Lifestyle   Work with your health care provider to maintain a healthy body weight or to lose weight. Ask what an ideal weight is for you.  Get at least 30 minutes of exercise that causes your heart to beat faster (aerobic exercise) most days of the week. Activities may include walking, swimming, or biking.  Include exercise to strengthen your muscles (resistance exercise), such as pilates or lifting weights, as part of your weekly exercise routine. Try to do these types  of exercises for 30 minutes at least 3 days a week.  Do not use any products that contain nicotine or tobacco, such as cigarettes and e-cigarettes. If you need help quitting, ask your health care provider.  Monitor your blood pressure at home as told by your health care provider.  Keep all follow-up visits as told by your health care provider. This is important. Medicines  Take over-the-counter and prescription medicines only as told by your health care provider. Follow directions carefully. Blood pressure medicines must be taken as  prescribed.  Do not skip doses of blood pressure medicine. Doing this puts you at risk for problems and can make the medicine less effective.  Ask your health care provider about side effects or reactions to medicines that you should watch for. Contact a health care provider if:  You think you are having a reaction to a medicine you are taking.  You have headaches that keep coming back (recurring).  You feel dizzy.  You have swelling in your ankles.  You have trouble with your vision. Get help right away if:  You develop a severe headache or confusion.  You have unusual weakness or numbness.  You feel faint.  You have severe pain in your chest or abdomen.  You vomit repeatedly.  You have trouble breathing. Summary  Hypertension is when the force of blood pumping through your arteries is too strong. If this condition is not controlled, it may put you at risk for serious complications.  Your personal target blood pressure may vary depending on your medical conditions, your age, and other factors. For most people, a normal blood pressure is less than 120/80.  Hypertension is treated with lifestyle changes, medicines, or a combination of both. Lifestyle changes include weight loss, eating a healthy, low-sodium diet, exercising more, and limiting alcohol. This information is not intended to replace advice given to you by your health care provider. Make sure you discuss any questions you have with your health care provider. Document Released: 04/21/2005 Document Revised: 03/19/2016 Document Reviewed: 03/19/2016 Elsevier Interactive Patient Education  2019 [employer] Inc.      Agustina Caroli, MD [facility]

## 2018-05-24 NOTE — Patient Instructions (Addendum)
If you have lab work done today you will be contacted with your lab results within the next 2 weeks.  If you have not heard from [place] then please contact us. The fastest way to get your results is to register for My Chart.   IF you received an x-ray today, you will receive an invoice from [facility]. Please contact [facility] at [phone] with questions or concerns regarding your invoice.   IF you received labwork today, you will receive an invoice from [place]. Please contact [facility] at [phone] with questions or concerns regarding your invoice.   Our billing staff will not be able to assist you with questions regarding bills from these companies.  You will be contacted with the lab results as soon as they are available. The fastest way to get your results is to activate your My Chart account. Instructions are located on the last page of this paperwork. If you have not heard from [place] regarding the results in 2 weeks, please contact this office.     Hypertension Hypertension, commonly called high blood pressure, is when the force of blood pumping through the arteries is too strong. The arteries are the blood vessels that carry blood from the heart throughout the body. Hypertension forces the heart to work harder to pump blood and may cause arteries to become narrow or stiff. Having untreated or uncontrolled hypertension can cause heart attacks, strokes, kidney disease, and other problems. A blood pressure reading consists of a higher number over a lower number. Ideally, your blood pressure should be below 120/80. The first ("top") number is called the systolic pressure. It is a measure of the pressure in your arteries as your heart beats. The second ("bottom") number is called the diastolic pressure. It is a measure of the pressure in your arteries as the heart relaxes. What are the causes? The cause of this condition is not known. What increases the risk? Some  risk factors for high blood pressure are under your control. Others are not. Factors you can change  Smoking.  Having type 2 diabetes mellitus, high cholesterol, or both.  Not getting enough exercise or physical activity.  Being overweight.  Having too much fat, sugar, calories, or salt (sodium) in your diet.  Drinking too much alcohol. Factors that are difficult or impossible to change  Having chronic kidney disease.  Having a family history of high blood pressure.  Age. Risk increases with age.  Race. You may be at higher risk if you are African-American.  Gender. Men are at higher risk than women before age 2. After age 73, women are at higher risk than men.  Having obstructive sleep apnea.  Stress. What are the signs or symptoms? Extremely high blood pressure (hypertensive crisis) may cause:  Headache.  Anxiety.  Shortness of breath.  Nosebleed.  Nausea and vomiting.  Severe chest pain.  Jerky movements you cannot control (seizures). How is this diagnosed? This condition is diagnosed by measuring your blood pressure while you are seated, with your arm resting on a surface. The cuff of the blood pressure monitor will be placed directly against the skin of your upper arm at the level of your heart. It should be measured at least twice using the same arm. Certain conditions can cause a difference in blood pressure between your right and left arms. Certain factors can cause blood pressure readings to be lower or higher than normal (elevated) for a short period of time:  When your  blood pressure is higher when you are in a health care provider's office than when you are at home, this is called white coat hypertension. Most people with this condition do not need medicines.  When your blood pressure is higher at home than when you are in a health care provider's office, this is called masked hypertension. Most people with this condition may need medicines to control  blood pressure. If you have a high blood pressure reading during one visit or you have normal blood pressure with other risk factors:  You may be asked to return on a different day to have your blood pressure checked again.  You may be asked to monitor your blood pressure at home for 1 week or longer. If you are diagnosed with hypertension, you may have other blood or imaging tests to help your health care provider understand your overall risk for other conditions. How is this treated? This condition is treated by making healthy lifestyle changes, such as eating healthy foods, exercising more, and reducing your alcohol intake. Your health care provider may prescribe medicine if lifestyle changes are not enough to get your blood pressure under control, and if:  Your systolic blood pressure is above 130.  Your diastolic blood pressure is above 80. Your personal target blood pressure may vary depending on your medical conditions, your age, and other factors. Follow these instructions at home: Eating and drinking   Eat a diet that is high in fiber and potassium, and low in sodium, added sugar, and fat. An example eating plan is called the DASH (Dietary Approaches to Stop Hypertension) diet. To eat this way: ? Eat plenty of fresh fruits and vegetables. Try to fill half of your plate at each meal with fruits and vegetables. ? Eat whole grains, such as whole wheat pasta, brown rice, or whole grain bread. Fill about one quarter of your plate with whole grains. ? Eat or drink low-fat dairy products, such as skim milk or low-fat yogurt. ? Avoid fatty cuts of meat, processed or cured meats, and poultry with skin. Fill about one quarter of your plate with lean proteins, such as fish, chicken without skin, beans, eggs, and tofu. ? Avoid premade and processed foods. These tend to be higher in sodium, added sugar, and fat.  Reduce your daily sodium intake. Most people with hypertension should eat less than  1,500 mg of sodium a day.  Limit alcohol intake to no more than 1 drink a day for nonpregnant women and 2 drinks a day for men. One drink equals 12 oz of beer, 5 oz of wine, or 1 oz of hard liquor. Lifestyle   Work with your health care provider to maintain a healthy body weight or to lose weight. Ask what an ideal weight is for you.  Get at least 30 minutes of exercise that causes your heart to beat faster (aerobic exercise) most days of the week. Activities may include walking, swimming, or biking.  Include exercise to strengthen your muscles (resistance exercise), such as pilates or lifting weights, as part of your weekly exercise routine. Try to do these types of exercises for 30 minutes at least 3 days a week.  Do not use any products that contain nicotine or tobacco, such as cigarettes and e-cigarettes. If you need help quitting, ask your health care provider.  Monitor your blood pressure at home as told by your health care provider.  Keep all follow-up visits as told by your health care provider. This is  important. Medicines  Take over-the-counter and prescription medicines only as told by your health care provider. Follow directions carefully. Blood pressure medicines must be taken as prescribed.  Do not skip doses of blood pressure medicine. Doing this puts you at risk for problems and can make the medicine less effective.  Ask your health care provider about side effects or reactions to medicines that you should watch for. Contact a health care provider if:  You think you are having a reaction to a medicine you are taking.  You have headaches that keep coming back (recurring).  You feel dizzy.  You have swelling in your ankles.  You have trouble with your vision. Get help right away if:  You develop a severe headache or confusion.  You have unusual weakness or numbness.  You feel faint.  You have severe pain in your chest or abdomen.  You vomit  repeatedly.  You have trouble breathing. Summary  Hypertension is when the force of blood pumping through your arteries is too strong. If this condition is not controlled, it may put you at risk for serious complications.  Your personal target blood pressure may vary depending on your medical conditions, your age, and other factors. For most people, a normal blood pressure is less than 120/80.  Hypertension is treated with lifestyle changes, medicines, or a combination of both. Lifestyle changes include weight loss, eating a healthy, low-sodium diet, exercising more, and limiting alcohol. This information is not intended to replace advice given to you by your health care provider. Make sure you discuss any questions you have with your health care provider. Document Released: 04/21/2005 Document Revised: 03/19/2016 Document Reviewed: 03/19/2016 Elsevier Interactive Patient Education  2019 [employer].

## 2018-05-25 ENCOUNTER — Encounter: Payer: Self-pay | Admitting: Radiology

## 2018-05-25 ENCOUNTER — Telehealth: Payer: Self-pay | Admitting: *Deleted

## 2018-05-25 LAB — COMPREHENSIVE METABOLIC PANEL
ALT: 19 IU/L (ref 0–44)
AST: 18 IU/L (ref 0–40)
Albumin/Globulin Ratio: 2.1 (ref 1.2–2.2)
Albumin: 4.7 g/dL (ref 3.8–4.8)
Alkaline Phosphatase: 100 IU/L (ref 39–117)
BUN/Creatinine Ratio: 13 (ref 10–24)
BUN: 11 mg/dL (ref 8–27)
Bilirubin Total: 0.7 mg/dL (ref 0.0–1.2)
CO2: 24 mmol/L (ref 20–29)
Calcium: 10 mg/dL (ref 8.6–10.2)
Chloride: 104 mmol/L (ref 96–106)
Creatinine, Ser: 0.88 mg/dL (ref 0.76–1.27)
GFR calc Af Amer: 101 mL/min/{1.73_m2} (ref >59)
GFR calc non Af Amer: 87 mL/min/{1.73_m2} (ref >59)
GLUCOSE: 119 mg/dL — AB (ref 65–99)
Globulin, Total: 2.2 g/dL (ref 1.5–4.5)
Potassium: 4.4 mmol/L (ref 3.5–5.2)
Sodium: 146 mmol/L — ABNORMAL HIGH (ref 134–144)
Total Protein: 6.9 g/dL (ref 6.0–8.5)

## 2018-05-25 LAB — HEMOGLOBIN A1C
Est. average glucose Bld gHb Est-mCnc: 160 mg/dL
Hgb A1c MFr Bld: 7.2 % — ABNORMAL HIGH (ref 4.8–5.6)

## 2018-05-25 LAB — LIPID PANEL
Chol/HDL Ratio: 4.1 ratio (ref 0.0–5.0)
Cholesterol, Total: 217 mg/dL — ABNORMAL HIGH (ref 100–199)
HDL: 53 mg/dL (ref >39)
LDL Calculated: 151 mg/dL — ABNORMAL HIGH (ref 0–99)
Triglycerides: 65 mg/dL (ref 0–149)
VLDL CHOLESTEROL CAL: 13 mg/dL (ref 5–40)

## 2018-05-25 NOTE — Telephone Encounter (Signed)
Called patient's mobile number, no answer after 10 rings. Also, no voice mail to leave a message.

## 2018-05-25 NOTE — Progress Notes (Signed)
Called patient's mobile number without an answer after 10 rings. Also, unable to leave a message no voice mail.

## 2018-06-01 ENCOUNTER — Ambulatory Visit: Payer: Medicare Other

## 2018-06-01 ENCOUNTER — Ambulatory Visit (INDEPENDENT_AMBULATORY_CARE_PROVIDER_SITE_OTHER): Payer: Medicare Other | Admitting: Emergency Medicine

## 2018-06-01 VITALS — BP 140/78

## 2018-06-01 DIAGNOSIS — Z0000 Encounter for general adult medical examination without abnormal findings: Secondary | ICD-10-CM

## 2018-06-01 DIAGNOSIS — E119 Type 2 diabetes mellitus without complications: Secondary | ICD-10-CM

## 2018-06-01 MED ORDER — METFORMIN HCL 500 MG PO TABS: ORAL_TABLET | ORAL | 3 refills | Status: DC

## 2018-06-01 NOTE — Progress Notes (Signed)
Nutritional Status: BMI 25 -29 Overweight Nutritional Risks: None Diabetes: No  How often do you need to have someone help you when you read instructions, pamphlets, or other written materials from your doctor or pharmacy?: 1 - Never What is the last grade level you completed in school?: 9th grade   Is the patient's home free of loose throw rugs in walkways, pet beds, electrical cords, etc?   yes      Grab bars in the bathroom? no      Handrails on the stairs?   no, no steps at home       Adequate lighting?   yes  Timed Get Up and Go performed: yes, completed within 30 seconds  Qualifies for Shingles Vaccine? Advised patient to check with pharmacy about receiving the Shingrix vaccine      Presents today for Medicare Annual Wellness Visit    Patient Care Team: Horald Pollen, MD as PCP - General (Internal Medicine) Joretta Bachelor, PA as Physician Assistant (Physician Assistant)   Dr. Katy Fitch eye doctor will be book appointment  Educated on Shingrix   Other Screening: Last screening for diabetes: 05/24/2018 Last lipid screening: 05/24/2018    Immunization status:  Immunization History  Administered Date(s) Administered  . Influenza,inj,Quad PF,6+ Mos 03/30/2017  . Pneumococcal Conjugate-13 02/26/2017     Health Maintenance Due  Topic Date Due  . TETANUS/TDAP  05/29/1966  . COLONOSCOPY  05/29/1997  . PNA vac Low Risk Adult (2 of 2 - PPSV23) 02/26/2018  . FOOT EXAM  03/30/2018     Functional Status Survey: Is the patient deaf or have difficulty hearing?: No Does the patient have difficulty seeing, even when wearing glasses/contacts?: No Does the patient have difficulty concentrating, remembering, or making decisions?: No Does the patient have difficulty walking or climbing stairs?: No Does the patient have difficulty dressing or bathing?: No Does the patient have difficulty doing errands alone such as visiting a doctor's office or shopping?:  No   6CIT Screen 06/01/2018 05/29/2017  What Year? 0 points 4 points  What month? 3 points 0 points  What time? 0 points 3 points  Count back from 20 4 points 4 points  Months in reverse 4 points 4 points  Repeat phrase 0 points 10 points  Total Score 11 25        Clinical Support from 06/01/2018 in Primary Care at [facility]  AUDIT-C Score  0         Patient Active Problem List   Diagnosis Date Noted  . Essential hypertension 05/24/2018  . Dyslipidemia 05/24/2018  . Diabetes ([place]) 06/09/2017  . Acute conjunctivitis of both eyes 01/01/2017  . Elevated blood pressure reading in office without diagnosis of hypertension 01/01/2017  . Screening for endocrine/metabolic/immunity disorders 01/01/2017     Past Medical History:  Diagnosis Date  . Allergy   . Diabetes mellitus without complication ([place])   . Hypertension      History reviewed. No pertinent surgical history.   History reviewed. No pertinent family history.   Social History   Socioeconomic History  . Marital status: Single    Spouse name: Not on file  . Number of children: 0  . Years of education: Not on file  . Highest education level: 9th grade  Occupational History  . Not on file  Social Needs  . Financial resource strain: Not hard at all  . Food insecurity:    Worry: Never true    Inability: Never true  .  Transportation needs:    Medical: No    Non-medical: No  Tobacco Use  . Smoking status: Former Smoker    Packs/day: 1.00    Years: 48.00    Pack years: 48.00    Last attempt to quit: 02/20/2015    Years since quitting: 3.2  . Smokeless tobacco: Former Systems developer    Quit date: 10/2013  Substance and Sexual Activity  . Alcohol use: No    Frequency: Never  . Drug use: No  . Sexual activity: Yes    Birth control/protection: Condom  Lifestyle  . Physical activity:    Days per week: 0 days    Minutes per session: 0 min  . Stress: Not at all  Relationships  . Social connections:    Talks on  phone: More than three times a week    Gets together: More than three times a week    Attends religious service: More than 4 times per year    Active member of club or organization: No    Attends meetings of clubs or organizations: Never    Relationship status: Never married  . Intimate partner violence:    Fear of current or ex partner: No    Emotionally abused: No    Physically abused: No    Forced sexual activity: No  Other Topics Concern  . Not on file  Social History Narrative  . Not on file     No Known Allergies   Prior to Admission medications   Medication Sig Start Date End Date Taking? Authorizing Provider  atorvastatin (LIPITOR) 20 MG tablet Take 1 tablet (20 mg total) by mouth daily. 05/24/18  Yes Sagardia, Ines Bloomer, MD  cetirizine (ZYRTEC) 10 MG tablet Take 1 tablet (10 mg total) by mouth daily. 09/03/17  Yes Jaynee Eagles, PA-C  fluticasone (FLONASE) 50 MCG/ACT nasal spray Place 2 sprays into both nostrils daily. 09/03/17  Yes Jaynee Eagles, PA-C  lisinopril (PRINIVIL,ZESTRIL) 20 MG tablet Take 1 tablet (20 mg total) by mouth daily. 05/24/18  Yes Horald Pollen, MD  metFORMIN (GLUCOPHAGE) 500 MG tablet Take 2 TABLETS BY MOUTH DAILY WITH FOOD 06/01/18  Yes Sagardia, Ines Bloomer, MD  Multiple Vitamins-Minerals (MULTIVITAMIN MEN) TABS Take 1 tablet by mouth daily.   Yes [provider]     Depression screen [facility] 2/9 06/01/2018 05/24/2018 09/03/2017 05/29/2017 05/29/2017  Decreased Interest 0 0 0 0 0  Down, Depressed, Hopeless 0 0 0 0 0  PHQ - 2 Score 0 0 0 0 0     Fall Risk  06/01/2018 05/24/2018 09/03/2017 05/29/2017 05/29/2017  Falls in the past year? 0 0 No No No  Number falls in past yr: 0 - - - -  Injury with Fall? 0 - - - -  Follow up Falls prevention discussed;Education provided - - - -      PHYSICAL EXAM: There were no vitals taken for this visit.   Wt Readings from Last 3 Encounters:  05/24/18 161 lb 12.8 oz (73.4 kg)  09/03/17 159 lb 12.8 oz (72.5  kg)  05/29/17 161 lb (73 kg)     No exam data present    Physical Exam   Education/Counseling provided regarding diet and exercise, prevention of chronic diseases, smoking/tobacco cessation, if applicable, and reviewed "Covered Medicare Preventive Services."   ASSESSMENT/PLAN: 1. Medicare annual wellness visit, subsequent  2. Type 2 diabetes mellitus without complication, without long-term current use of insulin (HCC) - metFORMIN (GLUCOPHAGE) 500 MG tablet; Take 2 TABLETS  BY MOUTH DAILY WITH FOOD - Ambulatory referral to Podiatry

## 2018-06-01 NOTE — Patient Instructions (Signed)
Thank you for taking time to come for your Medicare Wellness Visit. I appreciate your ongoing commitment to your health goals. Please review the following plan we discussed and let me know if I can assist you in the future.  Leroy Kennedy LPN Diabetes Mellitus and Nutrition, Adult When you have diabetes (diabetes mellitus), it is very important to have healthy eating habits because your blood sugar (glucose) levels are greatly affected by what you eat and drink. Eating healthy foods in the appropriate amounts, at about the same times every day, can help you:  Control your blood glucose.  Lower your risk of heart disease.  Improve your blood pressure.  Reach or maintain a healthy weight. Every person with diabetes is different, and each person has different needs for a meal plan. Your health care provider may recommend that you work with a diet and nutrition specialist (dietitian) to make a meal plan that is best for you. Your meal plan may vary depending on factors such as:  The calories you need.  The medicines you take.  Your weight.  Your blood glucose, blood pressure, and cholesterol levels.  Your activity level.  Other health conditions you have, such as heart or kidney disease. How do carbohydrates affect me? Carbohydrates, also called carbs, affect your blood glucose level more than any other type of food. Eating carbs naturally raises the amount of glucose in your blood. Carb counting is a method for keeping track of how many carbs you eat. Counting carbs is important to keep your blood glucose at a healthy level, especially if you use insulin or take certain oral diabetes medicines. It is important to know how many carbs you can safely have in each meal. This is different for every person. Your dietitian can help you calculate how many carbs you should have at each meal and for each snack. Foods that contain carbs include:  Bread, cereal, rice, pasta, and crackers.  Potatoes  and corn.  Peas, beans, and lentils.  Milk and yogurt.  Fruit and juice.  Desserts, such as cakes, cookies, ice cream, and candy. How does alcohol affect me? Alcohol can cause a sudden decrease in blood glucose (hypoglycemia), especially if you use insulin or take certain oral diabetes medicines. Hypoglycemia can be a life-threatening condition. Symptoms of hypoglycemia (sleepiness, dizziness, and confusion) are similar to symptoms of having too much alcohol. If your health care provider says that alcohol is safe for you, follow these guidelines:  Limit alcohol intake to no more than 1 drink per day for nonpregnant women and 2 drinks per day for men. One drink equals 12 oz of beer, 5 oz of wine, or 1 oz of hard liquor.  Do not drink on an empty stomach.  Keep yourself hydrated with water, diet soda, or unsweetened iced tea.  Keep in mind that regular soda, juice, and other mixers may contain a lot of sugar and must be counted as carbs. What are tips for following this plan?  Reading food labels  Start by checking the serving size on the "Nutrition Facts" label of packaged foods and drinks. The amount of calories, carbs, fats, and other nutrients listed on the label is based on one serving of the item. Many items contain more than one serving per package.  Check the total grams (g) of carbs in one serving. You can calculate the number of servings of carbs in one serving by dividing the total carbs by 15. For example, if a food has 30  g of total carbs, it would be equal to 2 servings of carbs.  Check the number of grams (g) of saturated and trans fats in one serving. Choose foods that have low or no amount of these fats.  Check the number of milligrams (mg) of salt (sodium) in one serving. Most people should limit total sodium intake to less than 2,300 mg per day.  Always check the nutrition information of foods labeled as "low-fat" or "nonfat". These foods may be higher in added sugar  or refined carbs and should be avoided.  Talk to your dietitian to identify your daily goals for nutrients listed on the label. Shopping  Avoid buying canned, premade, or processed foods. These foods tend to be high in fat, sodium, and added sugar.  Shop around the outside edge of the grocery store. This includes fresh fruits and vegetables, bulk grains, fresh meats, and fresh dairy. Cooking  Use low-heat cooking methods, such as baking, instead of high-heat cooking methods like deep frying.  Cook using healthy oils, such as olive, canola, or sunflower oil.  Avoid cooking with butter, cream, or high-fat meats. Meal planning  Eat meals and snacks regularly, preferably at the same times every day. Avoid going long periods of time without eating.  Eat foods high in fiber, such as fresh fruits, vegetables, beans, and whole grains. Talk to your dietitian about how many servings of carbs you can eat at each meal.  Eat 4-6 ounces (oz) of lean protein each day, such as lean meat, chicken, fish, eggs, or tofu. One oz of lean protein is equal to: ? 1 oz of meat, chicken, or fish. ? 1 egg. ?  cup of tofu.  Eat some foods each day that contain healthy fats, such as avocado, nuts, seeds, and fish. Lifestyle  Check your blood glucose regularly.  Exercise regularly as told by your health care provider. This may include: ? 150 minutes of moderate-intensity or vigorous-intensity exercise each week. This could be brisk walking, biking, or water aerobics. ? Stretching and doing strength exercises, such as yoga or weightlifting, at least 2 times a week.  Take medicines as told by your health care provider.  Do not use any products that contain nicotine or tobacco, such as cigarettes and e-cigarettes. If you need help quitting, ask your health care provider.  Work with a Social worker or diabetes educator to identify strategies to manage stress and any emotional and social challenges. Questions to  ask a health care provider  Do I need to meet with a diabetes educator?  Do I need to meet with a dietitian?  What number can I call if I have questions?  When are the best times to check my blood glucose? Where to find more information:  American Diabetes Association: diabetes.org  Academy of Nutrition and Dietetics: www.eatright.[employer] of Diabetes and Digestive and Kidney Diseases (NIH): [employer] Summary  A healthy meal plan will help you control your blood glucose and maintain a healthy lifestyle.  Working with a diet and nutrition specialist (dietitian) can help you make a meal plan that is best for you.  Keep in mind that carbohydrates (carbs) and alcohol have immediate effects on your blood glucose levels. It is important to count carbs and to use alcohol carefully. This information is not intended to replace advice given to you by your health care provider. Make sure you discuss any questions you have with your health care provider. Document Released: 01/16/2005 Document Revised: 11/19/2016 Document Reviewed:  05/26/2016 Elsevier Interactive Patient Education  [employer].

## 2018-06-22 ENCOUNTER — Ambulatory Visit (INDEPENDENT_AMBULATORY_CARE_PROVIDER_SITE_OTHER)
Admission: RE | Admit: 2018-06-22 | Discharge: 2018-06-22 | Disposition: A | Discharge status: Home / Self Care | Payer: Medicare Other | Source: Ambulatory Visit | Attending: Acute Care | Admitting: Acute Care

## 2018-06-22 DIAGNOSIS — Z87891 Personal history of nicotine dependence: Secondary | ICD-10-CM

## 2018-06-22 DIAGNOSIS — Z122 Encounter for screening for malignant neoplasm of respiratory organs: Secondary | ICD-10-CM

## 2018-06-22 IMAGING — CT CT CHEST LUNG CANCER SCREENING LOW DOSE W/O CM
2 of 4 series · 15 of 40 positions shown, 18 images · non-contrast
Comparison: Low-dose lung cancer screening chest CT [DATE].

CLINICAL DATA: 73-year-old male former smoker (quit in [CS]) with
48 pack-year history of smoking. Lung cancer screening examination.

EXAM:
CT CHEST WITHOUT CONTRAST LOW-DOSE FOR LUNG CANCER SCREENING
TECHNIQUE: Multidetector CT imaging of the chest was performed following the
standard protocol without IV contrast.

[Series 2: thorax 5.0 i31f 3 · axial · 0.65mm/px · z∈[+1290,+1535]mm · 12 of 59 slices shown, 15 images]
[im 5/59  mediastinal]
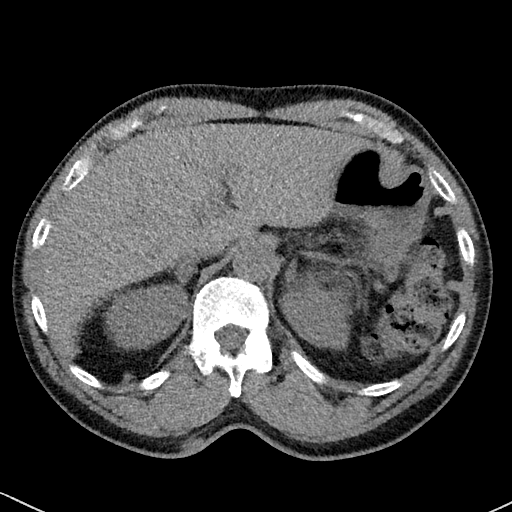
[im 5/59  lung]
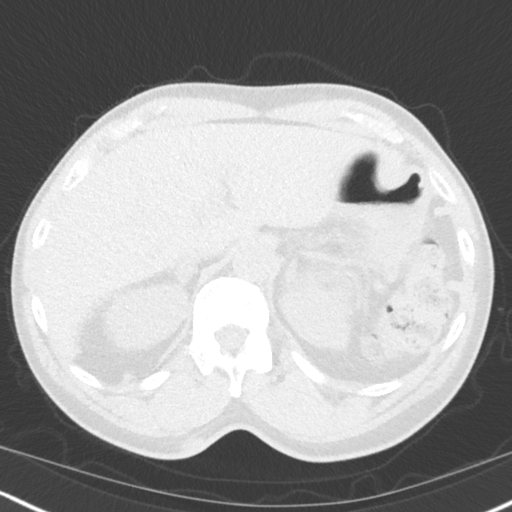
[im 9/59  lung]
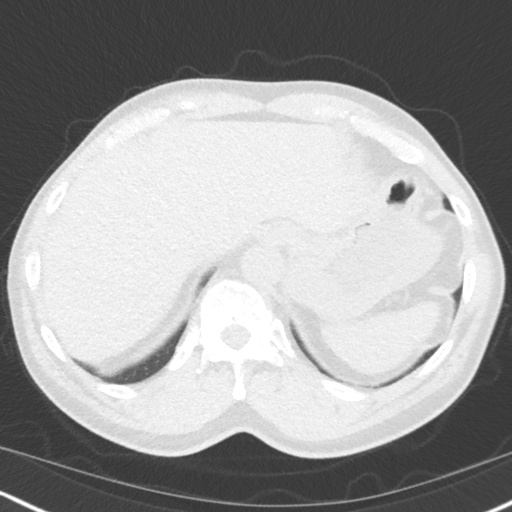
[im 14/59  lung]
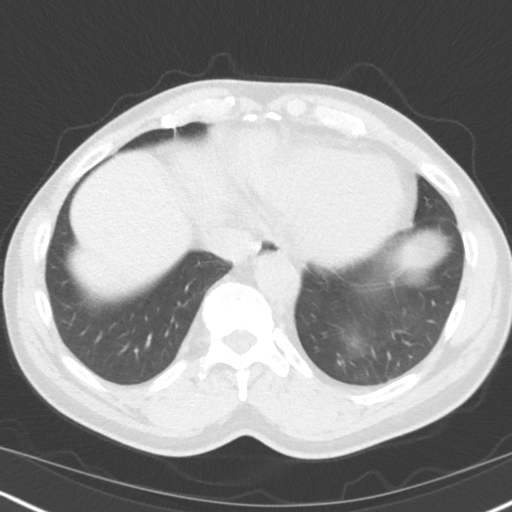
[im 18/59  lung]
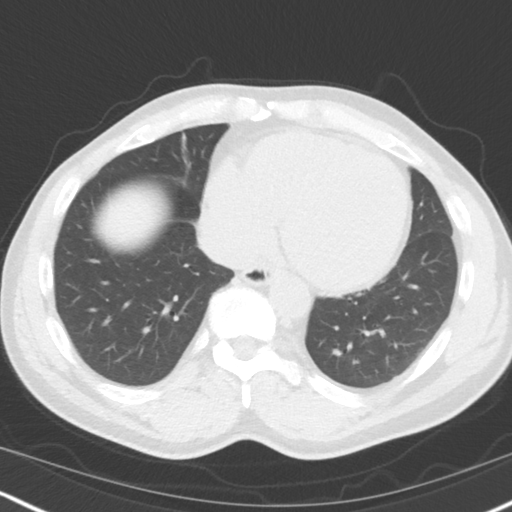
[im 23/59  mediastinal]
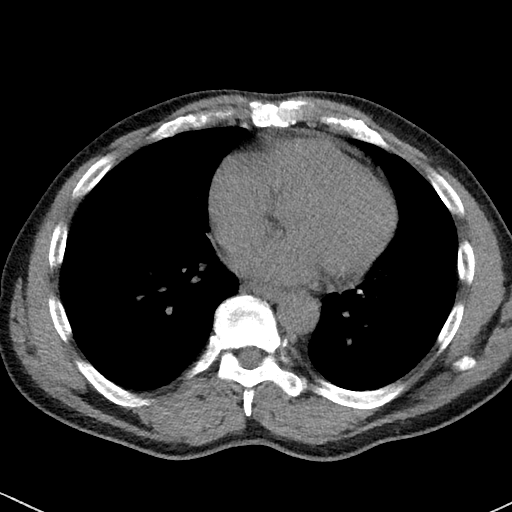
[im 23/59  lung]
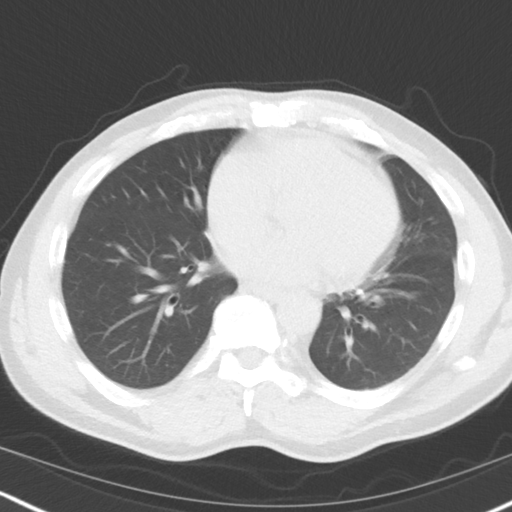
[im 27/59  lung]
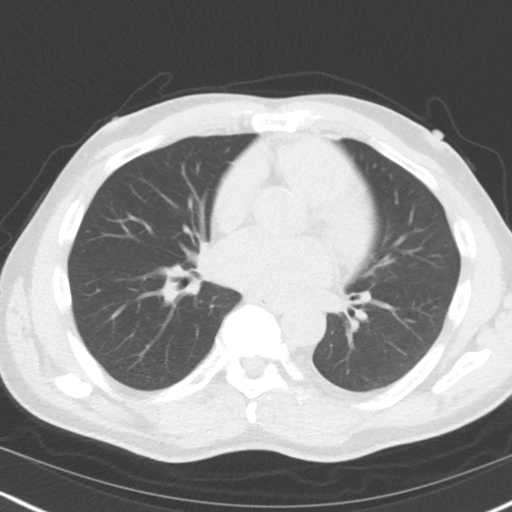
[im 32/59  lung]
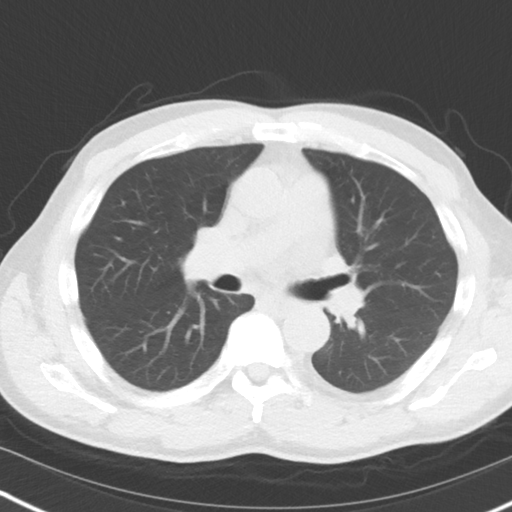
[im 36/59  lung]
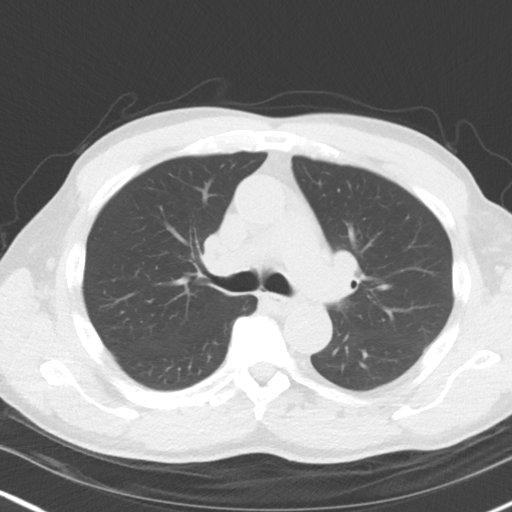
[im 41/59  mediastinal]
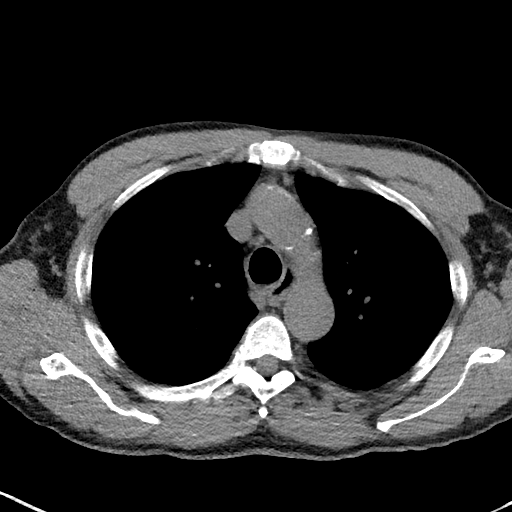
[im 41/59  lung]
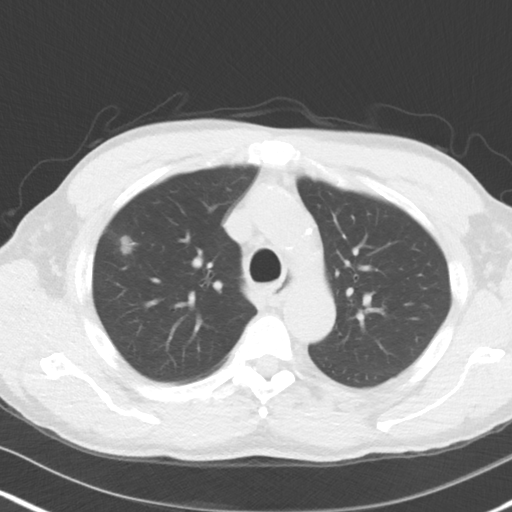
[im 45/59  lung]
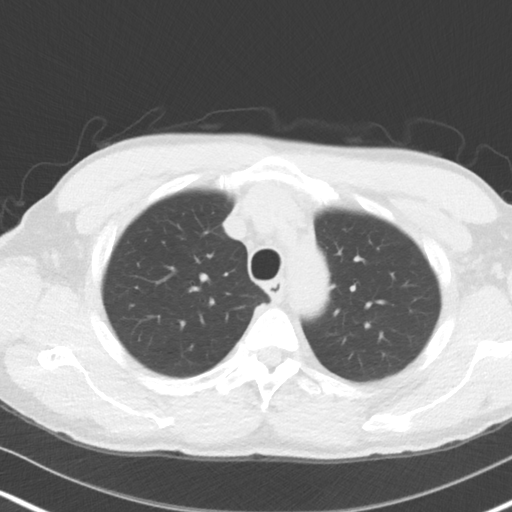
[im 50/59  lung]
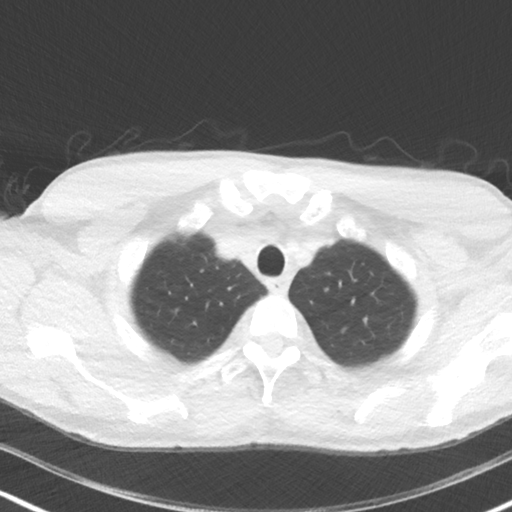
[im 54/59  lung]
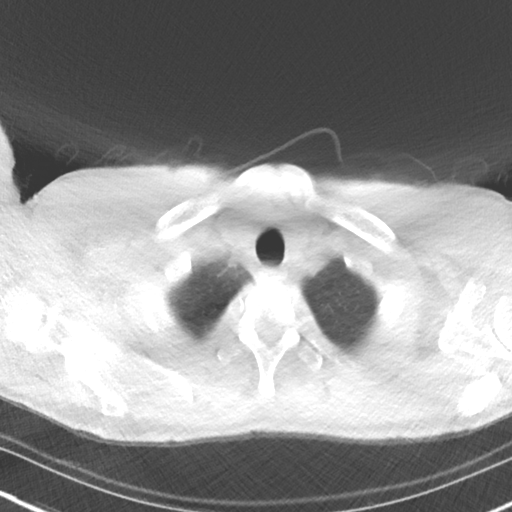

[Series 5: coronal · coronal · 0.58mm/px · 3 of 119 slices shown]
[im 24/119  lung]
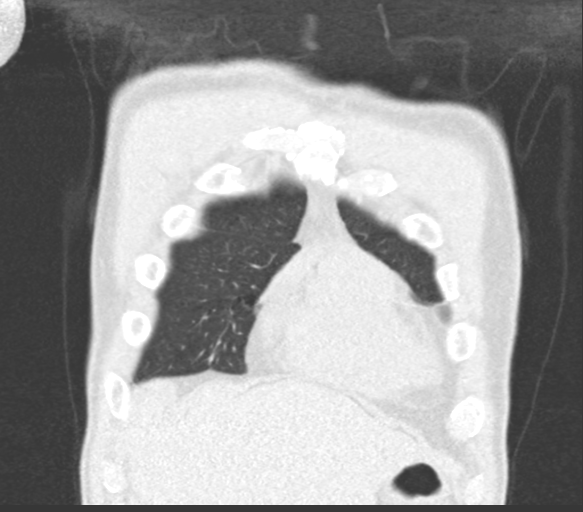
[im 48/119  lung]
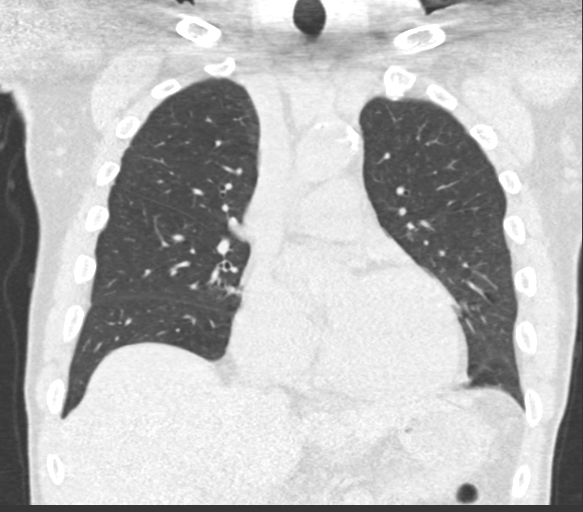
[im 71/119  lung]
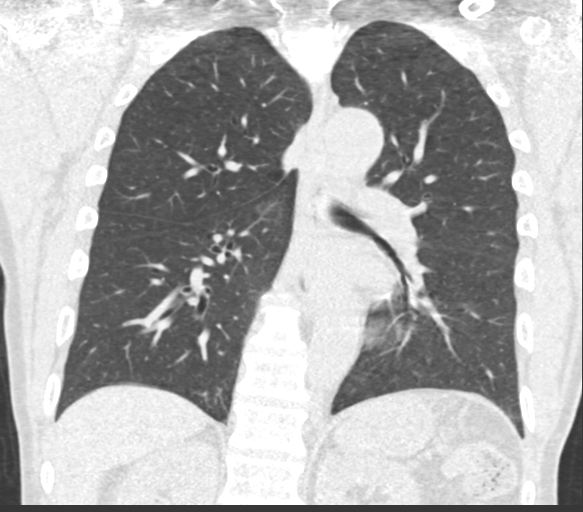

[15 of 40 positions shown; findings below may reference images not displayed]

FINDINGS: Cardiovascular: Heart size is normal. There is no significant
pericardial fluid, thickening or pericardial calcification. There is
aortic atherosclerosis, as well as atherosclerosis of the great
vessels of the mediastinum and the coronary arteries, including
calcified atherosclerotic plaque in the right coronary artery.

Mediastinum/Nodes: No pathologically enlarged mediastinal or hilar
lymph nodes. Please note that accurate exclusion of hilar adenopathy
is limited on noncontrast CT scans. Esophagus is unremarkable in
appearance. No axillary lymphadenopathy.

Lungs/Pleura: Previously noted small right upper lobe pulmonary
nodule has significantly increased in size, currently with a volume
derived mean diameter of 9.9 mm (axial image 94 of series 3). No
other suspicious appearing pulmonary nodules or masses are noted. No
acute consolidative airspace disease. No pleural effusions. Mild
diffuse bronchial wall thickening with very mild centrilobular and
paraseptal emphysema.

Upper Abdomen: Aortic atherosclerosis.

Musculoskeletal: There are no aggressive appearing lytic or blastic
lesions noted in the visualized portions of the skeleton.
IMPRESSION: 1. Lung-RADS 4BS, suspicious. Additional imaging evaluation or
consultation with Pulmonology or Thoracic Surgery recommended.
2. The "S" modifier above refers to potentially clinically
significant non lung cancer related findings. Specifically, there is
aortic atherosclerosis, in addition to right coronary artery
disease. Please note that although the presence of coronary artery
calcium documents the presence of coronary artery disease, the
severity of this disease and any potential stenosis cannot be
assessed on this non-gated CT examination. Assessment for potential
risk factor modification, dietary therapy or pharmacologic therapy
may be warranted, if clinically indicated.
3. Mild diffuse bronchial wall thickening with very mild
centrilobular and paraseptal emphysema; imaging findings suggestive
of underlying COPD.

These results will be called to the ordering clinician or
representative by the Radiologist Assistant, and communication
documented in the PACS or zVision Dashboard.

Aortic Atherosclerosis ([CS]-[CS]) and Emphysema ([CS]-[CS]).

## 2018-06-23 ENCOUNTER — Telehealth: Payer: Self-pay

## 2018-06-23 NOTE — Telephone Encounter (Signed)
Call Report Olivia Mackie from [facility]:   IMPRESSION: 1. Lung-RADS 4BS, suspicious. Additional imaging evaluation or consultation with Pulmonology or Thoracic Surgery recommended. 2. The "S" modifier above refers to potentially clinically significant non lung cancer related findings. Specifically, there is aortic atherosclerosis, in addition to right coronary artery disease. Please note that although the presence of coronary artery calcium documents the presence of coronary artery disease, the severity of this disease and any potential stenosis cannot be assessed on this non-gated CT examination. Assessment for potential risk factor modification, dietary therapy or pharmacologic therapy may be warranted, if clinically indicated. 3. Mild diffuse bronchial wall thickening with very mild centrilobular and paraseptal emphysema; imaging findings suggestive of underlying COPD.  SG please advise. Thanks.

## 2018-06-25 ENCOUNTER — Other Ambulatory Visit: Payer: Self-pay

## 2018-06-25 ENCOUNTER — Ambulatory Visit: Payer: Medicare Other | Admitting: Podiatry

## 2018-06-25 DIAGNOSIS — R918 Other nonspecific abnormal finding of lung field: Secondary | ICD-10-CM

## 2018-07-08 ENCOUNTER — Ambulatory Visit (HOSPITAL_COMMUNITY): Payer: Medicare Other

## 2018-07-09 NOTE — Telephone Encounter (Signed)
Benjamin Willis please advise if you have seen this or if there is anything we need to help with.

## 2018-07-13 ENCOUNTER — Ambulatory Visit (INDEPENDENT_AMBULATORY_CARE_PROVIDER_SITE_OTHER): Payer: Medicare Other | Admitting: Emergency Medicine

## 2018-07-13 DIAGNOSIS — R918 Other nonspecific abnormal finding of lung field: Secondary | ICD-10-CM | POA: Diagnosis not present

## 2018-07-13 LAB — PULMONARY FUNCTION TEST
DL/VA % pred: 144 %
DL/VA: 5.9 ml/min/mmHg/L
DLCO unc % pred: 87 %
DLCO unc: 20.46 ml/min/mmHg
RV % pred: 103 %
RV: 2.39 L
TLC % pred: 69 %
TLC: 4.47 L

## 2018-07-13 NOTE — Telephone Encounter (Signed)
Per patient's chart, the PET scan has now been rescheduled to 3.17.2020 @ 1000. PFT has been rescheduled for today 07/13/18 @ 1500 Deanna, pt's sister is aware they will be contacted regarding results  Will sign and forward to Judson Roch NP to make her aware

## 2018-07-13 NOTE — Progress Notes (Signed)
PFT done today. 

## 2018-07-13 NOTE — Telephone Encounter (Signed)
Thank you so much for getting everything scheduled so quickly.

## 2018-07-13 NOTE — Telephone Encounter (Signed)
This was called to the patient at the time the results were called. He was scheduled for a PET scan 3/5 which had to be rescheduled because he was not NPO prior. This has not been rescheduled. I spoke with his sister this morning. I have left Rodena Piety a message re: getting this re-scheduled asap. Please check with her . The PFT's also need to be rescheduled.Please use [phone] as the contact. This is his sister, who manages all of his appointments. Thanks so much.

## 2018-07-20 ENCOUNTER — Other Ambulatory Visit: Payer: Self-pay

## 2018-07-20 ENCOUNTER — Ambulatory Visit (HOSPITAL_COMMUNITY)
Admission: RE | Admit: 2018-07-20 | Discharge: 2018-07-20 | Disposition: A | Discharge status: Home / Self Care | Payer: Medicare Other | Source: Ambulatory Visit | Attending: Primary Care | Admitting: Primary Care

## 2018-07-20 ENCOUNTER — Telehealth: Payer: Self-pay | Admitting: Primary Care

## 2018-07-20 DIAGNOSIS — R918 Other nonspecific abnormal finding of lung field: Secondary | ICD-10-CM | POA: Insufficient documentation

## 2018-07-20 LAB — GLUCOSE, CAPILLARY: GLUCOSE-CAPILLARY: 136 mg/dL — AB (ref 70–99)

## 2018-07-20 IMAGING — CT NUCLEAR MEDICINE PET IMAGE INITIAL (PI) SKULL BASE TO THIGH
1 of 8 series · 3 of 16 positions shown, 4 images · non-contrast
Comparison: CT chest [DATE]

CLINICAL DATA: Initial treatment strategy for lung nodule.

EXAM:
NUCLEAR MEDICINE PET SKULL BASE TO THIGH
TECHNIQUE: 8.0 mCi F-18 FDG was injected intravenously. Full-ring PET imaging
was performed from the skull base to thigh after the radiotracer. CT
data was obtained and used for attenuation correction and anatomic
localization.
Fasting blood glucose: 136 mg/dl

[Series 4: ct sk_thigh 5.0 b31f · axial · 0.98mm/px · z∈[-741,+139]mm · 3 of 221 slices shown, 4 images]
[im 1/221  soft-tissue]
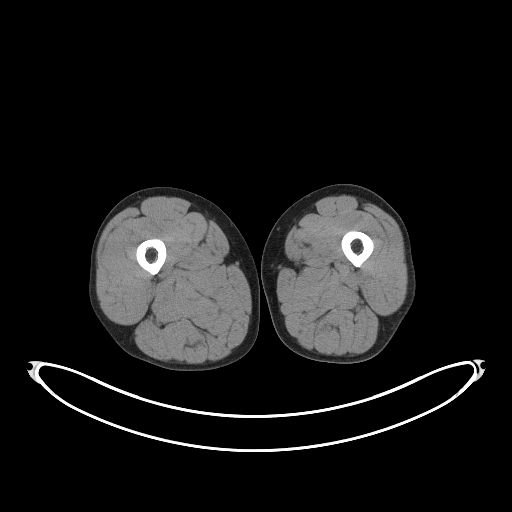
[im 1/221  bone]
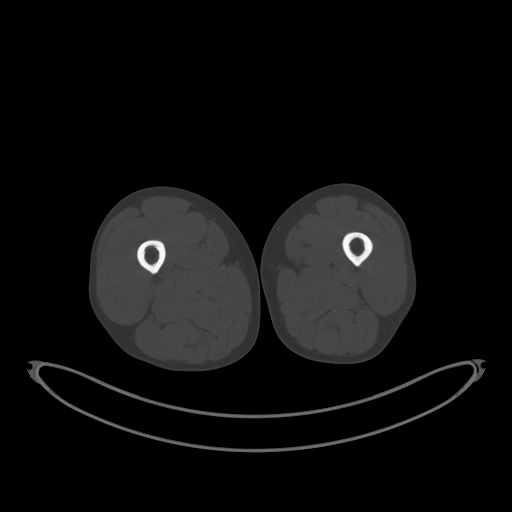
[im 111/221  soft-tissue]
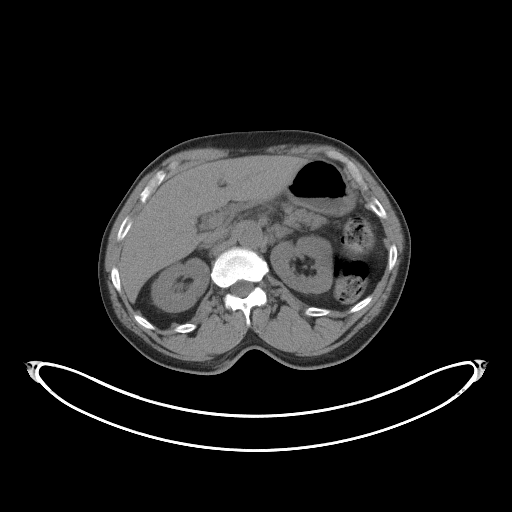
[im 221/221  soft-tissue]
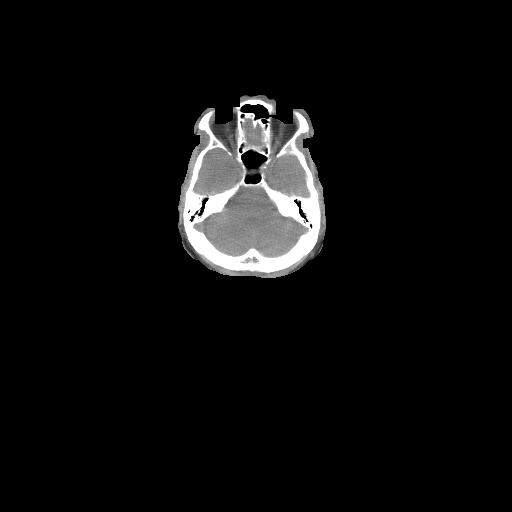

[3 of 16 positions shown; findings below may reference images not displayed]

FINDINGS: Mediastinal blood pool activity: SUV max

NECK: No hypermetabolic lymph nodes in the neck.

Incidental CT findings: none

CHEST: 10 mm right upper lobe pulmonary nodule seen on recent lung
cancer screening CT measures 13 mm today and is hypermetabolic with
SUV max = 5.4.

A high right paratracheal node (image 50/series 4) measures 6 mm in
short axis and demonstrates FDG accumulation above background blood
pool levels ( SUV max = 3.4. Small lymph nodes in the AP window
(60/4) show low level FDG accumulation with SUV max = 2.8. No
definite hypermetabolic disease in the right hilum with low level
uptake identified in the left hilum.

Incidental CT findings: Atherosclerotic calcification is noted in
the wall of the thoracic aorta. No other suspicious pulmonary nodule
or mass. No pleural effusion.

ABDOMEN/PELVIS: No abnormal hypermetabolic activity within the
liver, pancreas, adrenal glands, or spleen. No hypermetabolic lymph
nodes in the abdomen or pelvis.

Incidental CT findings: There is abdominal aortic atherosclerosis
without aneurysm. Small bilateral groin hernias contain only fat.

SKELETON: No focal hypermetabolic activity to suggest skeletal
metastasis.

Incidental CT findings: No worrisome lytic or sclerotic osseous
abnormality.
IMPRESSION: 1. 13 mm right upper lobe pulmonary nodule is hypermetabolic
consistent with primary bronchogenic neoplasm.
2. 6 mm short axis high right paratracheal node is within normal
limits for size but shows low level hypermetabolism. Although no
hypermetabolic disease is evident in the right hilum, metastatic
involvement of the right paratracheal node can not be excluded.
3. Low level uptake in the AP window is at or just above blood pool
levels, nonspecific.
4. No evidence for unexpected or suspicious hypermetabolic disease
in the neck, abdomen, or pelvis.
5.  Aortic Atherosclerois ([I5]-170.0)

## 2018-07-20 MED ORDER — FLUDEOXYGLUCOSE F - 18 (FDG) INJECTION
8.0400 | Freq: Once | INTRAVENOUS | Status: AC | PRN
  Administered 2018-07-20: 8.04 via INTRAVENOUS

## 2018-07-20 NOTE — Telephone Encounter (Addendum)
Needs apt with Dr. Valeta Harms this Thursday to discuss PET scan results. Per Eric Form NP, he will likely need EBUS and Nav bronchoscopy. As well as SuperD imaging.   Thanks

## 2018-07-20 NOTE — Telephone Encounter (Signed)
Spoke with sister (DPR).  Appt scheduled 07/22/18 at 2pm.  Nothing further needed.

## 2018-07-22 ENCOUNTER — Other Ambulatory Visit: Payer: Self-pay

## 2018-07-22 ENCOUNTER — Encounter: Payer: Self-pay | Admitting: Pulmonary Disease

## 2018-07-22 ENCOUNTER — Ambulatory Visit (INDEPENDENT_AMBULATORY_CARE_PROVIDER_SITE_OTHER): Payer: Medicare Other | Admitting: Pulmonary Disease

## 2018-07-22 VITALS — BP 138/88 | HR 67 | Ht 66.5 in | Wt 167.0 lb

## 2018-07-22 DIAGNOSIS — R590 Localized enlarged lymph nodes: Secondary | ICD-10-CM

## 2018-07-22 DIAGNOSIS — R911 Solitary pulmonary nodule: Secondary | ICD-10-CM

## 2018-07-22 DIAGNOSIS — Z87891 Personal history of nicotine dependence: Secondary | ICD-10-CM | POA: Diagnosis not present

## 2018-07-22 MED ORDER — UMECLIDINIUM-VILANTEROL 62.5-25 MCG/INH IN AEPB: 1.0000 | INHALATION_SPRAY | Freq: Every day | RESPIRATORY_TRACT | 0 refills | Status: DC

## 2018-07-22 NOTE — Patient Instructions (Addendum)
Thank you for visiting Dr. Valeta Harms at [facility]. Today we recommend the following: Orders Placed This Encounter  Procedures  . Ambulatory referral to Cardiothoracic Surgery  . Spirometry with graph   Meds ordered this encounter  Medications  . umeclidinium-vilanterol (ANORO ELLIPTA) 62.5-25 MCG/INH AEPB    Sig: Inhale 1 puff into the lungs daily.    Dispense:  1 each    Refill:  0    Order Specific Question:   Lot Number?    Answer:   [id number]    Order Specific Question:   Expiration Date?    Answer:   09/21/2019    Order Specific Question:   Manufacturer?    Answer:   [employer] [[id number]]    Order Specific Question:   Quantity    Answer:   1   Return in about 3 months (around 10/22/2018).

## 2018-07-22 NOTE — Progress Notes (Signed)
Synopsis: Referred in March 2020 for RUL lung nodule by Horald Pollen, *  Subjective:   PATIENT ID: Benjamin Willis. GENDER: male DOB: Aug 29, 1945, MRN: [id number]  Chief Complaint  Patient presents with  . Consult    Per [facility] Pih, abnormal chest ct. Recently developed cough with white cloudy mucous.     This is a 73 year old gentleman seen today in follow-up after recent low-dose lung cancer screening CT which revealed a right upper lobe pulmonary nodule followed by PET scan which revealed PET avid uptake within the lung nodule as well as a slightly increased activity within a 6 mm paratracheal lymph node that was just above the mediastinal blood pool SUV.  At baseline patient has some shortness of breath with significant exertion however otherwise has been doing well.  Every morning he does have a productive cough of cloudy white sputum.  Otherwise the patient is able to complete all activities of daily living.  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  He did have recent pulmonary function test completed.  He did have difficulty with bleeding spirometry.  We will plan to repeat off spirometry today to have this done.   Past Medical History:  Diagnosis Date  . Allergy   . Diabetes mellitus without complication ([place])   . Hypertension      Family History  Problem Relation Age of Onset  . Lung cancer Neg Hx      Past Surgical History:  Procedure Laterality Date  . none      Social History   Socioeconomic History  . Marital status: Single    Spouse name: Not on file  . Number of children: 0  . Years of education: Not on file  . Highest education level: 9th grade  Occupational History  . Not on file  Social Needs  . Financial resource strain: Not hard at all  . Food insecurity:    Worry: Never true    Inability: Never true  . Transportation needs:    Medical: No    Non-medical: No  Tobacco Use  . Smoking status: Former Smoker   Packs/day: 1.00    Years: 48.00    Pack years: 48.00    Last attempt to quit: 02/20/2015    Years since quitting: 3.4  . Smokeless tobacco: Former Systems developer    Quit date: 10/2013  Substance and Sexual Activity  . Alcohol use: No    Frequency: Never  . Drug use: No  . Sexual activity: Yes    Birth control/protection: Condom  Lifestyle  . Physical activity:    Days per week: 0 days    Minutes per session: 0 min  . Stress: Not at all  Relationships  . Social connections:    Talks on phone: More than three times a week    Gets together: More than three times a week    Attends religious service: More than 4 times per year    Active member of club or organization: No    Attends meetings of clubs or organizations: Never    Relationship status: Never married  . Intimate partner violence:    Fear of current or ex partner: No    Emotionally abused: No    Physically abused: No    Forced sexual activity: No  Other Topics Concern  . Not on file  Social History Narrative  . Not on file     No Known Allergies   Outpatient Medications Prior  to Visit  Medication Sig Dispense Refill  . atorvastatin (LIPITOR) 20 MG tablet Take 1 tablet (20 mg total) by mouth daily. 90 tablet 3  . cetirizine (ZYRTEC) 10 MG tablet Take 1 tablet (10 mg total) by mouth daily. (Patient taking differently: Take 10 mg by mouth daily as needed. ) 30 tablet 11  . fluticasone (FLONASE) 50 MCG/ACT nasal spray Place 2 sprays into both nostrils daily. 16 g 11  . lisinopril (PRINIVIL,ZESTRIL) 20 MG tablet Take 1 tablet (20 mg total) by mouth daily. 90 tablet 3  . metFORMIN (GLUCOPHAGE) 500 MG tablet Take 2 TABLETS BY MOUTH DAILY WITH FOOD 180 tablet 3  . Multiple Vitamins-Minerals (MULTIVITAMIN MEN) TABS Take 1 tablet by mouth daily.     No facility-administered medications prior to visit.     Review of Systems  Constitutional: Negative for chills, fever, malaise/fatigue and weight loss.  HENT: Negative for hearing  loss, sore throat and tinnitus.   Eyes: Negative for blurred vision and double vision.  Respiratory: Positive for cough and sputum production. Negative for hemoptysis, shortness of breath, wheezing and stridor.   Cardiovascular: Negative for chest pain, palpitations, orthopnea, leg swelling and PND.  Gastrointestinal: Negative for abdominal pain, constipation, diarrhea, heartburn, nausea and vomiting.  Genitourinary: Negative for dysuria, hematuria and urgency.  Musculoskeletal: Negative for joint pain and myalgias.  Skin: Negative for itching and rash.  Neurological: Negative for dizziness, tingling, weakness and headaches.  Endo/Heme/Allergies: Negative for environmental allergies. Does not bruise/bleed easily.  Psychiatric/Behavioral: Negative for depression. The patient is not nervous/anxious and does not have insomnia.   All other systems reviewed and are negative.    Objective:  Physical Exam Vitals signs reviewed.  Constitutional:      General: He is not in acute distress.    Appearance: He is well-developed.  HENT:     Head: Normocephalic and atraumatic.  Eyes:     General: No scleral icterus.    Conjunctiva/sclera: Conjunctivae normal.     Pupils: Pupils are equal, round, and reactive to light.  Neck:     Musculoskeletal: Neck supple.     Vascular: No JVD.     Trachea: No tracheal deviation.  Cardiovascular:     Rate and Rhythm: Normal rate and regular rhythm.     Heart sounds: Normal heart sounds. No murmur.  Pulmonary:     Effort: Pulmonary effort is normal. No tachypnea, accessory muscle usage or respiratory distress.     Breath sounds: Normal breath sounds. No stridor. No wheezing, rhonchi or rales.  Abdominal:     General: Bowel sounds are normal. There is no distension.     Palpations: Abdomen is soft.     Tenderness: There is no abdominal tenderness.  Musculoskeletal:        General: No tenderness.  Lymphadenopathy:     Cervical: No cervical adenopathy.   Skin:    General: Skin is warm and dry.     Capillary Refill: Capillary refill takes less than 2 seconds.     Findings: No rash.  Neurological:     Mental Status: He is alert and oriented to person, place, and time.  Psychiatric:        Behavior: Behavior normal.      Vitals:   07/22/18 1404  BP: 138/88  Pulse: 67  SpO2: 99%  Weight: 167 lb (75.8 kg)  Height: 5' 6.5" (1.689 m)   99% on RA BMI Readings from Last 3 Encounters:  07/22/18 26.55 kg/m  05/24/18  25.49 kg/m  09/03/17 25.03 kg/m   Wt Readings from Last 3 Encounters:  07/22/18 167 lb (75.8 kg)  05/24/18 161 lb 12.8 oz (73.4 kg)  09/03/17 159 lb 12.8 oz (72.5 kg)     CBC    Component Value Date/Time   WBC 8.5 02/19/2017 1146   WBC 6.6 12/21/2014 1213   RBC 4.28 (A) 02/19/2017 1146   RBC 4.25 12/21/2014 1213   HGB 13.1 (A) 02/19/2017 1146   HGB 13.7 01/01/2017 0939   HCT 39.8 (A) 02/19/2017 1146   HCT 40.4 01/01/2017 0939   PLT 334 01/01/2017 0939   MCV 93.0 02/19/2017 1146   MCV 90 01/01/2017 0939   MCH 30.5 02/19/2017 1146   MCH 30.6 12/21/2014 1213   MCHC 32.8 02/19/2017 1146   MCHC 32.7 12/21/2014 1213   RDW 13.3 01/01/2017 0939   LYMPHSABS 2.1 01/01/2017 0939   MONOABS 0.9 12/21/2014 1213   EOSABS 0.1 01/01/2017 0939   BASOSABS 0.0 01/01/2017 0939   Chest Imaging: 07/20/2018: PET SCAN  IMPRESSION: 1. 13 mm right upper lobe pulmonary nodule is hypermetabolic consistent with primary bronchogenic neoplasm. 2. 6 mm short axis high right paratracheal node is within normal limits for size but shows low level hypermetabolism. Although no hypermetabolic disease is evident in the right hilum, metastatic involvement of the right paratracheal node can not be excluded.  Pulmonary Functions Testing Results: PFT Results Latest Ref Rng & Units 07/13/2018  DLCO UNC% % 87  DLCO COR %Predicted % 144  TLC L 4.47  TLC % Predicted % 69  RV % Predicted % 103    FeNO: None   Pathology: None    Echocardiogram: None   Heart Catheterization: None     Assessment & Plan:   Nodule of upper lobe of right lung - Plan: Ambulatory referral to Cardiothoracic Surgery, Spirometry with graph  Mediastinal adenopathy - Plan: Ambulatory referral to Cardiothoracic Surgery  Former smoker  Discussion:  This is a 73 year old gentleman with a newly diagnosed right upper lobe pulmonary nodule that is PET avid after having low-dose lung cancer screening.  The PET scan did reveal low level uptake within a 6 mm paratracheal lymph node. Most lymphatic drainage would not necessarily lead to right upper lobe lesions into the paratracheal node however this does not necessarily exclude metastatic disease to the mediastinum.  Since the both lesions are very small.  I believe that the yield of endobronchial ultrasound as well as potential navigational bronchoscopy to the right upper lobe are small for diagnosis.  Therefore if the patient is able to tolerate we would able resection.  Today in the office we discussed the risk, benefits and alternatives of proceeding with various diagnostic approaches.  I will refer the patient to cardiothoracic surgery for evaluation of mediastinoscopy to obtain sampling from the peritracheal site.  If this was negative in frozen section Intra-Op would then consider transition to right upper lobectomy for removal of the PET avid nodule.  He recently had pulmonary function test was unable to complete spirometry.  Therefore we will complete a repeat spirometry today in the office.  Appears to have a FEV1 of approximately 2 L.  Patient to return to clinic in 3 months  Greater than 50% of this patient's 60-minute office visit was spent face-to-face discussing the above recommendations and treatment plan.   Current Outpatient Medications:  .  atorvastatin (LIPITOR) 20 MG tablet, Take 1 tablet (20 mg total) by mouth daily., Disp: 90 tablet, Rfl: 3 .  cetirizine (ZYRTEC) 10 MG  tablet, Take 1 tablet (10 mg total) by mouth daily. (Patient taking differently: Take 10 mg by mouth daily as needed. ), Disp: 30 tablet, Rfl: 11 .  fluticasone (FLONASE) 50 MCG/ACT nasal spray, Place 2 sprays into both nostrils daily., Disp: 16 g, Rfl: 11 .  lisinopril (PRINIVIL,ZESTRIL) 20 MG tablet, Take 1 tablet (20 mg total) by mouth daily., Disp: 90 tablet, Rfl: 3 .  metFORMIN (GLUCOPHAGE) 500 MG tablet, Take 2 TABLETS BY MOUTH DAILY WITH FOOD, Disp: 180 tablet, Rfl: 3 .  Multiple Vitamins-Minerals (MULTIVITAMIN MEN) TABS, Take 1 tablet by mouth daily., Disp: , Rfl:    Garner Nash, DO [facility] 07/22/2018 2:55 PM

## 2018-07-30 ENCOUNTER — Ambulatory Visit: Payer: Medicare Other | Admitting: Podiatry

## 2018-08-10 ENCOUNTER — Encounter: Payer: Medicare Other | Admitting: Thoracic Surgery (Cardiothoracic Vascular Surgery)

## 2018-08-17 ENCOUNTER — Encounter: Payer: Medicare Other | Admitting: Thoracic Surgery (Cardiothoracic Vascular Surgery)

## 2018-08-24 ENCOUNTER — Other Ambulatory Visit: Payer: Self-pay

## 2018-08-24 ENCOUNTER — Encounter: Payer: Self-pay | Admitting: Thoracic Surgery (Cardiothoracic Vascular Surgery)

## 2018-08-24 ENCOUNTER — Other Ambulatory Visit: Payer: Self-pay | Admitting: *Deleted

## 2018-08-24 ENCOUNTER — Encounter: Payer: Self-pay | Admitting: *Deleted

## 2018-08-24 ENCOUNTER — Institutional Professional Consult (permissible substitution) (INDEPENDENT_AMBULATORY_CARE_PROVIDER_SITE_OTHER): Payer: Medicare Other | Admitting: Thoracic Surgery (Cardiothoracic Vascular Surgery)

## 2018-08-24 VITALS — BP 176/80 | HR 62 | Temp 98.1°F | Resp 16 | Ht 66.5 in | Wt 164.8 lb

## 2018-08-24 DIAGNOSIS — D381 Neoplasm of uncertain behavior of trachea, bronchus and lung: Secondary | ICD-10-CM | POA: Diagnosis not present

## 2018-08-24 DIAGNOSIS — R911 Solitary pulmonary nodule: Secondary | ICD-10-CM

## 2018-08-24 NOTE — Progress Notes (Signed)
PCP is Horald Pollen, MD Referring Provider is Icard, Octavio Graves, DO  Chief Complaint  Patient presents with  . Lung Lesion    Surgical eval, Pet Scan 07/20/18, Chest CT 06/22/18, PFT's 07/13/18...unable to complete...SPIROMETRY in the office 07/22/18    HPI: Benjamin Willis is sent for consultation regarding a right upper lobe lung nodule.  Benjamin Willis is a 73 year old Willis with a history of tobacco abuse (1 pack/day x 48 years, quit 5 years ago), hypertension, type 2 diabetes without complication, and allergies.  He had a low-dose screening CT for lung cancer in February 2019.  It showed a 6 cm right upper lobe nodule.  One-year follow-up was recommended.  On repeat CT in February 2020 the nodule had increased in size to 13 x 10 x 8 mm.  A PET/CT showed hypermetabolic activity.  There was a questionable right paratracheal node which was normal size but did have a slight increase in activity.  He quit smoking 5 years ago.  He is retired from Biomedical scientist but remains physically active.  He is not having any chest pain, pressure, tightness, or shortness of breath with exertion.  He has an occasional nonproductive cough.  Denies any change in appetite or weight loss.  No unusual headaches or visual changes.  Zubrod Score: At the time of surgery this patient's most appropriate activity status/level should be described as: [x]     0    Normal activity, no symptoms []     1    Restricted in physical strenuous activity but ambulatory, able to do out light work []     2    Ambulatory and capable of self care, unable to do work activities, up and about >50 % of waking hours                              []     3    Only limited self care, in bed greater than 50% of waking hours []     4    Completely disabled, no self care, confined to bed or chair []     5    Moribund  Past Medical History:  Diagnosis Date  . Allergy   . Diabetes mellitus without complication ([place])   . Hypertension     Past Surgical  History:  Procedure Laterality Date  . none      Family History  Problem Relation Age of Onset  . Lung cancer Neg Hx     Social History Social History   Tobacco Use  . Smoking status: Former Smoker    Packs/day: 1.00    Years: 48.00    Pack years: 48.00    Last attempt to quit: 02/20/2015    Years since quitting: 3.5  . Smokeless tobacco: Former Systems developer    Quit date: 10/2013  Substance Use Topics  . Alcohol use: No    Frequency: Never  . Drug use: No    Current Outpatient Medications  Medication Sig Dispense Refill  . atorvastatin (LIPITOR) 20 MG tablet Take 1 tablet (20 mg total) by mouth daily. 90 tablet 3  . cetirizine (ZYRTEC) 10 MG tablet Take 1 tablet (10 mg total) by mouth daily. (Patient taking differently: Take 10 mg by mouth daily as needed. ) 30 tablet 11  . lisinopril (PRINIVIL,ZESTRIL) 20 MG tablet Take 1 tablet (20 mg total) by mouth daily. 90 tablet 3  . metFORMIN (GLUCOPHAGE) 500 MG tablet Take 2 TABLETS  BY MOUTH DAILY WITH FOOD 180 tablet 3  . Multiple Vitamins-Minerals (MULTIVITAMIN MEN) TABS Take 1 tablet by mouth daily.    Marland Kitchen umeclidinium-vilanterol (ANORO ELLIPTA) 62.5-25 MCG/INH AEPB Inhale 1 puff into the lungs daily. 1 each 0  . fluticasone (FLONASE) 50 MCG/ACT nasal spray Place 2 sprays into both nostrils daily. (Patient not taking: Reported on 08/24/2018) 16 g 11   No current facility-administered medications for this visit.     No Known Allergies  Review of Systems  Constitutional: Negative for activity change, appetite change, chills, fatigue, fever and unexpected weight change.  HENT: Positive for congestion. Negative for trouble swallowing and voice change.   Eyes: Negative for visual disturbance.  Respiratory: Positive for cough. Negative for shortness of breath.   Cardiovascular: Negative for chest pain, palpitations and leg swelling.  Gastrointestinal: Negative for abdominal distention and abdominal pain.  Endocrine: Negative for  polydipsia and polyphagia.  Genitourinary: Negative for difficulty urinating and dysuria.  Musculoskeletal: Negative for arthralgias and myalgias.  Neurological: Negative for seizures, syncope and weakness.  Hematological: Negative for adenopathy. Does not bruise/bleed easily.  All other systems reviewed and are negative.   BP (!) 176/80 (BP Location: Right Arm, Patient Position: Sitting, Cuff Size: Large)   Pulse 62   Temp 98.1 F (36.7 C) (Oral)   Resp 16   Ht 5' 6.5" (1.689 m)   Wt 164 lb 12.8 oz (74.8 kg)   SpO2 98% Comment: RA  BMI 26.20 kg/m  Physical Exam Vitals signs reviewed.  Constitutional:      General: He is not in acute distress.    Appearance: Normal appearance.  HENT:     Head: Normocephalic and atraumatic.     Comments: Wearing surgical mask Eyes:     General: No scleral icterus.    Extraocular Movements: Extraocular movements intact.     Conjunctiva/sclera: Conjunctivae normal.  Neck:     Musculoskeletal: Neck supple.     Vascular: No carotid bruit.  Cardiovascular:     Rate and Rhythm: Normal rate and regular rhythm.     Pulses: Normal pulses.     Heart sounds: Normal heart sounds. No murmur. No friction rub.  Pulmonary:     Effort: Pulmonary effort is normal. No respiratory distress.     Breath sounds: Normal breath sounds. No wheezing or rales.  Abdominal:     General: There is no distension.     Palpations: Abdomen is soft.     Tenderness: There is no abdominal tenderness.  Musculoskeletal:        General: No swelling.  Lymphadenopathy:     Cervical: No cervical adenopathy.  Skin:    General: Skin is warm and dry.  Neurological:     General: No focal deficit present.     Mental Status: He is alert.     Cranial Nerves: No cranial nerve deficit.     Motor: No weakness.     Coordination: Coordination normal.    Diagnostic Tests: CT CHEST WITHOUT CONTRAST LOW-DOSE FOR LUNG CANCER SCREENING  TECHNIQUE: Multidetector CT imaging of the  chest was performed following the standard protocol without IV contrast.  COMPARISON:  Low-dose lung cancer screening chest CT 06/17/2017.  FINDINGS: Cardiovascular: Heart size is normal. There is no significant pericardial fluid, thickening or pericardial calcification. There is aortic atherosclerosis, as well as atherosclerosis of the great vessels of the mediastinum and the coronary arteries, including calcified atherosclerotic plaque in the right coronary artery.  Mediastinum/Nodes: No pathologically enlarged mediastinal  or hilar lymph nodes. Please note that accurate exclusion of hilar adenopathy is limited on noncontrast CT scans. Esophagus is unremarkable in appearance. No axillary lymphadenopathy.  Lungs/Pleura: Previously noted small right upper lobe pulmonary nodule has significantly increased in size, currently with a volume derived mean diameter of 9.9 mm (axial image 94 of series 3). No other suspicious appearing pulmonary nodules or masses are noted. No acute consolidative airspace disease. No pleural effusions. Mild diffuse bronchial wall thickening with very mild centrilobular and paraseptal emphysema.  Upper Abdomen: Aortic atherosclerosis.  Musculoskeletal: There are no aggressive appearing lytic or blastic lesions noted in the visualized portions of the skeleton.  IMPRESSION: 1. Lung-RADS 4BS, suspicious. Additional imaging evaluation or consultation with Pulmonology or Thoracic Surgery recommended. 2. The "S" modifier above refers to potentially clinically significant non lung cancer related findings. Specifically, there is aortic atherosclerosis, in addition to right coronary artery disease. Please note that although the presence of coronary artery calcium documents the presence of coronary artery disease, the severity of this disease and any potential stenosis cannot be assessed on this non-gated CT examination. Assessment for potential risk factor  modification, dietary therapy or pharmacologic therapy may be warranted, if clinically indicated. 3. Mild diffuse bronchial wall thickening with very mild centrilobular and paraseptal emphysema; imaging findings suggestive of underlying COPD.  These results will be called to the ordering clinician or representative by the Radiologist Assistant, and communication documented in the PACS or zVision Dashboard.  Aortic Atherosclerosis (ICD10-I70.0) and Emphysema (ICD10-J43.9).   Electronically Signed   By: Vinnie Langton M.D.   On: 06/23/2018 08:26 NUCLEAR MEDICINE PET SKULL BASE TO THIGH  TECHNIQUE: 8.0 mCi F-18 FDG was injected intravenously. Full-ring PET imaging was performed from the skull base to thigh after the radiotracer. CT data was obtained and used for attenuation correction and anatomic localization.  Fasting blood glucose: 136 mg/dl  COMPARISON:  CT chest 06/22/2018  FINDINGS: Mediastinal blood pool activity: SUV max 2.2  NECK: No hypermetabolic lymph nodes in the neck.  Incidental CT findings: none  CHEST: 10 mm right upper lobe pulmonary nodule seen on recent lung cancer screening CT measures 13 mm today and is hypermetabolic with SUV max = 5.4.  A high right paratracheal node (image 50/series 4) measures 6 mm in short axis and demonstrates FDG accumulation above background blood pool levels ( SUV max = 3.4. Small lymph nodes in the AP window (60/4) show low level FDG accumulation with SUV max = 2.8. No definite hypermetabolic disease in the right hilum with low level uptake identified in the left hilum.  Incidental CT findings: Atherosclerotic calcification is noted in the wall of the thoracic aorta. No other suspicious pulmonary nodule or mass. No pleural effusion.  ABDOMEN/PELVIS: No abnormal hypermetabolic activity within the liver, pancreas, adrenal glands, or spleen. No hypermetabolic lymph nodes in the abdomen or  pelvis.  Incidental CT findings: There is abdominal aortic atherosclerosis without aneurysm. Small bilateral groin hernias contain only fat.  SKELETON: No focal hypermetabolic activity to suggest skeletal metastasis.  Incidental CT findings: No worrisome lytic or sclerotic osseous abnormality.  IMPRESSION: 1. 13 mm right upper lobe pulmonary nodule is hypermetabolic consistent with primary bronchogenic neoplasm. 2. 6 mm short axis high right paratracheal node is within normal limits for size but shows low level hypermetabolism. Although no hypermetabolic disease is evident in the right hilum, metastatic involvement of the right paratracheal node can not be excluded. 3. Low level uptake in the AP window is at or  just above blood pool levels, nonspecific. 4. No evidence for unexpected or suspicious hypermetabolic disease in the neck, abdomen, or pelvis. 5.  Aortic Atherosclerois (ICD10-170.0)   Electronically Signed   By: Misty Stanley M.D.   On: 07/20/2018 13:52 I personally reviewed the CT and PET/CT images and concur with the findings noted above.  I strongly suspect the right paratracheal node is reactive.  Pulmonary function testing FVC 2.7 (81%) FEV1 2.4 (98%) TLC 4.47 (69%) RV 2.39 (103%) DLCO 20.46 (87%)  Impression: Benjamin Willis is a 73 year old former smoker with a history of type 2 diabetes hypertension and hyperlipidemia.  He started the low-dose screening program in 2019.  On his initial screen he was found to have a 6 mm right upper lobe lung nodule.  A repeat scan a year later showed that nodule increased in size to 13 x 10 x 8 mm.  There was no mediastinal or hilar adenopathy.  On PET CT the nodule was hypermetabolic consistent with a primary bronchogenic carcinoma.  Infectious and inflammatory nodules are also in the differential but are far less likely given his age, smoking history, and appearance of the nodule.  There is a 2R node high in the right  paratracheal region that has some activity, but is normal in size.  I suspect this is reactive.  It would be a very unusual distribution to have just one isolated node in that location.  I recommended that we proceed with right VATS for wedge resection and possible upper lobectomy with node dissection.  I described the general nature of the procedure in detail to Benjamin Willis and his sister.  I informed them of the general nature of the procedure, the need for general anesthesia, the incisions to be used, the intraoperative decision making, use of a drainage tube postoperatively, the expected hospital stay, and the overall recovery.  I informed them of the indications, risk, benefits, and alternatives (radiation).  They understand the risks of surgery include, but not limited to death, MI, DVT, PE, bleeding, possible need for transfusion, infection, prolonged air leak, cardiac arrhythmias, as well as the possibility of other unforeseeable complications.  He has adequate pulmonary reserve to tolerate the procedure.  He accepts the risks and wishes to proceed.  Hypertension-blood pressure elevated today.  He says he did take his medication.  It was not elevated on his recent office visits.  No medication changes made.  Hyperlipidemia-on Lipitor  Plan: Right VATS for wedge resection, possible right upper lobectomy and mediastinal node dissection on Wednesday, 09/08/2018  Melrose Nakayama, MD [facility] [phone]

## 2018-09-06 ENCOUNTER — Other Ambulatory Visit (HOSPITAL_COMMUNITY): Payer: Medicare Other

## 2018-09-08 ENCOUNTER — Encounter (HOSPITAL_COMMUNITY): Admission: RE | Payer: Self-pay | Source: Home / Self Care

## 2018-09-08 ENCOUNTER — Inpatient Hospital Stay (HOSPITAL_COMMUNITY)
Admission: RE | Admit: 2018-09-08 | Payer: Medicare Other | Source: Home / Self Care | Admitting: Thoracic Surgery (Cardiothoracic Vascular Surgery)

## 2018-09-08 SURGERY — VIDEO ASSISTED THORACOSCOPY (VATS)/WEDGE RESECTION
Anesthesia: General | Site: Chest | Laterality: Right

## 2018-10-28 ENCOUNTER — Ambulatory Visit: Payer: Medicare Other | Admitting: Pulmonary Disease

## 2018-11-11 ENCOUNTER — Ambulatory Visit: Payer: Medicare Other | Admitting: Pulmonary Disease

## 2018-11-24 ENCOUNTER — Encounter: Payer: Self-pay | Admitting: Emergency Medicine

## 2018-11-24 ENCOUNTER — Other Ambulatory Visit: Payer: Self-pay

## 2018-11-24 ENCOUNTER — Ambulatory Visit (INDEPENDENT_AMBULATORY_CARE_PROVIDER_SITE_OTHER): Payer: Medicare Other | Admitting: Emergency Medicine

## 2018-11-24 VITALS — BP 184/76 | HR 61 | Temp 98.7°F | Resp 16 | Wt 166.4 lb

## 2018-11-24 DIAGNOSIS — I10 Essential (primary) hypertension: Secondary | ICD-10-CM

## 2018-11-24 DIAGNOSIS — E119 Type 2 diabetes mellitus without complications: Secondary | ICD-10-CM

## 2018-11-24 DIAGNOSIS — E785 Hyperlipidemia, unspecified: Secondary | ICD-10-CM

## 2018-11-24 DIAGNOSIS — Z23 Encounter for immunization: Secondary | ICD-10-CM | POA: Diagnosis not present

## 2018-11-24 DIAGNOSIS — Z1211 Encounter for screening for malignant neoplasm of colon: Secondary | ICD-10-CM

## 2018-11-24 DIAGNOSIS — E1169 Type 2 diabetes mellitus with other specified complication: Secondary | ICD-10-CM

## 2018-11-24 DIAGNOSIS — E1159 Type 2 diabetes mellitus with other circulatory complications: Secondary | ICD-10-CM | POA: Diagnosis not present

## 2018-11-24 NOTE — Progress Notes (Addendum)
Lab Results  Component Value Date   HGBA1C 7.2 (H) 05/24/2018   BP Readings from Last 3 Encounters:  11/24/18 (!) 184/76  08/24/18 (!) 176/80  07/22/18 138/88   Lab Results  Component Value Date   CHOL 217 (H) 05/24/2018   HDL 53 05/24/2018   LDLCALC 151 (H) 05/24/2018   TRIG 65 05/24/2018   CHOLHDL 4.1 05/24/2018   Benjamin Willis. 73 y.o.   Chief Complaint  Patient presents with  . Diabetes    6 month follow up  . Hypertension    HISTORY OF PRESENT ILLNESS: This is a 73 y.o. male with history of diabetes, hypertension, and dyslipidemia here for follow-up.  Has no complaints or medical concerns today. Needs colonoscopy referral and pneumonia vaccine. On a statin, ACE inhibitor, and metformin. Blood pressure readings at home within normal limits.  Measured blood pressure regularly. HPI   Prior to Admission medications   Medication Sig Start Date End Date Taking? Authorizing Provider  atorvastatin (LIPITOR) 20 MG tablet Take 1 tablet (20 mg total) by mouth daily. 05/24/18  Yes Benjamin Willis, Ines Bloomer, MD  cetirizine (ZYRTEC) 10 MG tablet Take 1 tablet (10 mg total) by mouth daily. 09/03/17  Yes Benjamin Eagles, PA-C  fluticasone (FLONASE) 50 MCG/ACT nasal spray Place 2 sprays into both nostrils daily. 09/03/17  Yes Benjamin Eagles, PA-C  lisinopril (PRINIVIL,ZESTRIL) 20 MG tablet Take 1 tablet (20 mg total) by mouth daily. 05/24/18  Yes Benjamin Pollen, MD  metFORMIN (GLUCOPHAGE) 500 MG tablet Take 2 TABLETS BY MOUTH DAILY WITH FOOD 06/01/18  Yes Benjamin Willis, Ines Bloomer, MD  Multiple Vitamins-Minerals (MULTIVITAMIN MEN) TABS Take 1 tablet by mouth daily.   Yes [provider]  umeclidinium-vilanterol (ANORO ELLIPTA) 62.5-25 MCG/INH AEPB Inhale 1 puff into the lungs daily. 07/22/18  Yes Icard, Octavio Graves, DO    No Known Allergies  Patient Active Problem List   Diagnosis Date Noted  . Essential hypertension 05/24/2018  . Dyslipidemia 05/24/2018  . Diabetes ([place])  06/09/2017    Past Medical History:  Diagnosis Date  . Allergy   . Diabetes mellitus without complication ([place])   . Hypertension     Past Surgical History:  Procedure Laterality Date  . none      Social History   Socioeconomic History  . Marital status: Single    Spouse name: Not on file  . Number of children: 0  . Years of education: Not on file  . Highest education level: 9th grade  Occupational History  . Not on file  Social Needs  . Financial resource strain: Not hard at all  . Food insecurity    Worry: Never true    Inability: Never true  . Transportation needs    Medical: No    Non-medical: No  Tobacco Use  . Smoking status: Former Smoker    Packs/day: 1.00    Years: 48.00    Pack years: 48.00    Quit date: 02/20/2015    Years since quitting: 3.7  . Smokeless tobacco: Former Systems developer    Quit date: 10/2013  Substance and Sexual Activity  . Alcohol use: No    Frequency: Never  . Drug use: No  . Sexual activity: Yes    Birth control/protection: Condom  Lifestyle  . Physical activity    Days per week: 0 days    Minutes per session: 0 min  . Stress: Not at all  Relationships  . Social Herbalist on phone: More  than three times a week    Gets together: More than three times a week    Attends religious service: More than 4 times per year    Active member of club or organization: No    Attends meetings of clubs or organizations: Never    Relationship status: Never married  . Intimate partner violence    Fear of current or ex partner: No    Emotionally abused: No    Physically abused: No    Forced sexual activity: No  Other Topics Concern  . Not on file  Social History Narrative  . Not on file    Family History  Problem Relation Age of Onset  . Lung cancer Neg Hx      Review of Systems  Constitutional: Negative.  Negative for chills, fever and malaise/fatigue.  HENT: Negative.  Negative for congestion and sore throat.   Eyes:  Negative.   Respiratory: Negative.  Negative for cough and shortness of breath.   Cardiovascular: Negative.  Negative for chest pain and palpitations.  Gastrointestinal: Negative.  Negative for abdominal pain, diarrhea, nausea and vomiting.  Genitourinary: Negative.  Negative for dysuria and hematuria.  Skin: Negative.  Negative for rash.  Neurological: Negative.  Negative for dizziness and headaches.  Endo/Heme/Allergies: Negative.   All other systems reviewed and are negative.   Vitals:   11/24/18 0821  BP: (!) 184/76  Pulse: 61  Resp: 16  Temp: 98.7 F (37.1 C)  SpO2: 99%  Repeat blood pressure in the room 140/70  Physical Exam Vitals signs reviewed.  Constitutional:      Appearance: Normal appearance.  HENT:     Head: Normocephalic and atraumatic.     Nose: Nose normal.     Mouth/Throat:     Mouth: Mucous membranes are moist.     Pharynx: Oropharynx is clear.  Eyes:     Extraocular Movements: Extraocular movements intact.     Conjunctiva/sclera: Conjunctivae normal.     Pupils: Pupils are equal, round, and reactive to light.  Neck:     Musculoskeletal: Normal range of motion and neck supple.  Cardiovascular:     Rate and Rhythm: Normal rate and regular rhythm.     Heart sounds: Normal heart sounds.  Pulmonary:     Effort: Pulmonary effort is normal.     Breath sounds: Normal breath sounds.  Abdominal:     General: There is no distension.     Palpations: Abdomen is soft.     Tenderness: There is no abdominal tenderness.  Musculoskeletal: Normal range of motion.  Skin:    General: Skin is warm and dry.     Capillary Refill: Capillary refill takes less than 2 seconds.  Neurological:     General: No focal deficit present.     Mental Status: He is alert and oriented to person, place, and time.  Psychiatric:        Mood and Affect: Mood normal.        Behavior: Behavior normal.      ASSESSMENT & PLAN: Benjamin Willis was seen today for diabetes and hypertension.   Diagnoses and all orders for this visit:  Hypertension associated with type 2 diabetes mellitus ([place])  Need for prophylactic vaccination against Streptococcus pneumoniae (pneumococcus) -     Pneumococcal polysaccharide vaccine 23-valent greater than or equal to 2yo subcutaneous/IM  Type 2 diabetes mellitus without complication, without long-term current use of insulin ([facility]) -     CBC with Differential/Platelet -  Comprehensive metabolic panel -     Hemoglobin A1c  Essential hypertension -     CBC with Differential/Platelet  Dyslipidemia -     Lipid panel  Colon cancer screening -     Ambulatory referral to Gastroenterology  Dyslipidemia associated with type 2 diabetes mellitus ([place])   Clinically stable.  No medical concerns identified during this visit.  Continue present medications.  No changes.  Follow-up in 6 months.   Patient Instructions  Hypertension, Adult High blood pressure (hypertension) is when the force of blood pumping through the arteries is too strong. The arteries are the blood vessels that carry blood from the heart throughout the body. Hypertension forces the heart to work harder to pump blood and may cause arteries to become narrow or stiff. Untreated or uncontrolled hypertension can cause a heart attack, heart failure, a stroke, kidney disease, and other problems. A blood pressure reading consists of a higher number over a lower number. Ideally, your blood pressure should be below 120/80. The first ("top") number is called the systolic pressure. It is a measure of the pressure in your arteries as your heart beats. The second ("bottom") number is called the diastolic pressure. It is a measure of the pressure in your arteries as the heart relaxes. What are the causes? The exact cause of this condition is not known. There are some conditions that result in or are related to high blood pressure. What increases the risk? Some risk factors for high blood pressure  are under your control. The following factors may make you more likely to develop this condition:  Smoking.  Having type 2 diabetes mellitus, high cholesterol, or both.  Not getting enough exercise or physical activity.  Being overweight.  Having too much fat, sugar, calories, or salt (sodium) in your diet.  Drinking too much alcohol. Some risk factors for high blood pressure may be difficult or impossible to change. Some of these factors include:  Having chronic kidney disease.  Having a family history of high blood pressure.  Age. Risk increases with age.  Race. You may be at higher risk if you are African American.  Gender. Men are at higher risk than women before age 58. After age 63, women are at higher risk than men.  Having obstructive sleep apnea.  Stress. What are the signs or symptoms? High blood pressure may not cause symptoms. Very high blood pressure (hypertensive crisis) may cause:  Headache.  Anxiety.  Shortness of breath.  Nosebleed.  Nausea and vomiting.  Vision changes.  Severe chest pain.  Seizures. How is this diagnosed? This condition is diagnosed by measuring your blood pressure while you are seated, with your arm resting on a flat surface, your legs uncrossed, and your feet flat on the floor. The cuff of the blood pressure monitor will be placed directly against the skin of your upper arm at the level of your heart. It should be measured at least twice using the same arm. Certain conditions can cause a difference in blood pressure between your right and left arms. Certain factors can cause blood pressure readings to be lower or higher than normal for a short period of time:  When your blood pressure is higher when you are in a health care provider's office than when you are at home, this is called white coat hypertension. Most people with this condition do not need medicines.  When your blood pressure is higher at home than when you are in a  health care  provider's office, this is called masked hypertension. Most people with this condition may need medicines to control blood pressure. If you have a high blood pressure reading during one visit or you have normal blood pressure with other risk factors, you may be asked to:  Return on a different day to have your blood pressure checked again.  Monitor your blood pressure at home for 1 week or longer. If you are diagnosed with hypertension, you may have other blood or imaging tests to help your health care provider understand your overall risk for other conditions. How is this treated? This condition is treated by making healthy lifestyle changes, such as eating healthy foods, exercising more, and reducing your alcohol intake. Your health care provider may prescribe medicine if lifestyle changes are not enough to get your blood pressure under control, and if:  Your systolic blood pressure is above 130.  Your diastolic blood pressure is above 80. Your personal target blood pressure may vary depending on your medical conditions, your age, and other factors. Follow these instructions at home: Eating and drinking   Eat a diet that is high in fiber and potassium, and low in sodium, added sugar, and fat. An example eating plan is called the DASH (Dietary Approaches to Stop Hypertension) diet. To eat this way: ? Eat plenty of fresh fruits and vegetables. Try to fill one half of your plate at each meal with fruits and vegetables. ? Eat whole grains, such as whole-wheat pasta, brown rice, or whole-grain bread. Fill about one fourth of your plate with whole grains. ? Eat or drink low-fat dairy products, such as skim milk or low-fat yogurt. ? Avoid fatty cuts of meat, processed or cured meats, and poultry with skin. Fill about one fourth of your plate with lean proteins, such as fish, chicken without skin, beans, eggs, or tofu. ? Avoid pre-made and processed foods. These tend to be higher in  sodium, added sugar, and fat.  Reduce your daily sodium intake. Most people with hypertension should eat less than 1,500 mg of sodium a day.  Do not drink alcohol if: ? Your health care provider tells you not to drink. ? You are pregnant, may be pregnant, or are planning to become pregnant.  If you drink alcohol: ? Limit how much you use to:  0-1 drink a day for women.  0-2 drinks a day for men. ? Be aware of how much alcohol is in your drink. In the [place] one drink equals one 12 oz bottle of beer (355 mL), one 5 oz glass of wine (148 mL), or one 1 oz glass of hard liquor (44 mL). Lifestyle   Work with your health care provider to maintain a healthy body weight or to lose weight. Ask what an ideal weight is for you.  Get at least 30 minutes of exercise most days of the week. Activities may include walking, swimming, or biking.  Include exercise to strengthen your muscles (resistance exercise), such as Pilates or lifting weights, as part of your weekly exercise routine. Try to do these types of exercises for 30 minutes at least 3 days a week.  Do not use any products that contain nicotine or tobacco, such as cigarettes, e-cigarettes, and chewing tobacco. If you need help quitting, ask your health care provider.  Monitor your blood pressure at home as told by your health care provider.  Keep all follow-up visits as told by your health care provider. This is important. Medicines  Take over-the-counter and  prescription medicines only as told by your health care provider. Follow directions carefully. Blood pressure medicines must be taken as prescribed.  Do not skip doses of blood pressure medicine. Doing this puts you at risk for problems and can make the medicine less effective.  Ask your health care provider about side effects or reactions to medicines that you should watch for. Contact a health care provider if you:  Think you are having a reaction to a medicine you are taking.   Have headaches that keep coming back (recurring).  Feel dizzy.  Have swelling in your ankles.  Have trouble with your vision. Get help right away if you:  Develop a severe headache or confusion.  Have unusual weakness or numbness.  Feel faint.  Have severe pain in your chest or abdomen.  Vomit repeatedly.  Have trouble breathing. Summary  Hypertension is when the force of blood pumping through your arteries is too strong. If this condition is not controlled, it may put you at risk for serious complications.  Your personal target blood pressure may vary depending on your medical conditions, your age, and other factors. For most people, a normal blood pressure is less than 120/80.  Hypertension is treated with lifestyle changes, medicines, or a combination of both. Lifestyle changes include losing weight, eating a healthy, low-sodium diet, exercising more, and limiting alcohol. This information is not intended to replace advice given to you by your health care provider. Make sure you discuss any questions you have with your health care provider. Document Released: 04/21/2005 Document Revised: 12/30/2017 Document Reviewed: 12/30/2017 Elsevier Patient Education  2020 [employer].    Agustina Caroli, MD [facility]

## 2018-11-24 NOTE — Patient Instructions (Signed)

## 2018-11-25 ENCOUNTER — Other Ambulatory Visit: Payer: Self-pay | Admitting: Emergency Medicine

## 2018-11-25 DIAGNOSIS — E119 Type 2 diabetes mellitus without complications: Secondary | ICD-10-CM

## 2018-11-25 LAB — COMPREHENSIVE METABOLIC PANEL
ALT: 28 IU/L (ref 0–44)
AST: 22 IU/L (ref 0–40)
Albumin/Globulin Ratio: 2.1 (ref 1.2–2.2)
Albumin: 4.1 g/dL (ref 3.7–4.7)
Alkaline Phosphatase: 96 IU/L (ref 39–117)
BUN/Creatinine Ratio: 14 (ref 10–24)
BUN: 11 mg/dL (ref 8–27)
Bilirubin Total: 0.3 mg/dL (ref 0.0–1.2)
CO2: 26 mmol/L (ref 20–29)
Calcium: 9.5 mg/dL (ref 8.6–10.2)
Chloride: 104 mmol/L (ref 96–106)
Creatinine, Ser: 0.8 mg/dL (ref 0.76–1.27)
GFR calc Af Amer: 104 mL/min/{1.73_m2} (ref >59)
GFR calc non Af Amer: 90 mL/min/{1.73_m2} (ref >59)
Globulin, Total: 2 g/dL (ref 1.5–4.5)
Glucose: 154 mg/dL — ABNORMAL HIGH (ref 65–99)
Potassium: 4.4 mmol/L (ref 3.5–5.2)
Sodium: 142 mmol/L (ref 134–144)
Total Protein: 6.1 g/dL (ref 6.0–8.5)

## 2018-11-25 LAB — CBC WITH DIFFERENTIAL/PLATELET
Basophils Absolute: 0 10*3/uL (ref 0.0–0.2)
Basos: 0 %
EOS (ABSOLUTE): 0.1 10*3/uL (ref 0.0–0.4)
Eos: 2 %
Hematocrit: 37.8 % (ref 37.5–51.0)
Hemoglobin: 12.9 g/dL — ABNORMAL LOW (ref 13.0–17.7)
Immature Grans (Abs): 0 10*3/uL (ref 0.0–0.1)
Immature Granulocytes: 0 %
Lymphocytes Absolute: 2.1 10*3/uL (ref 0.7–3.1)
Lymphs: 38 %
MCH: 30.9 pg (ref 26.6–33.0)
MCHC: 34.1 g/dL (ref 31.5–35.7)
MCV: 90 fL (ref 79–97)
Monocytes Absolute: 0.4 10*3/uL (ref 0.1–0.9)
Monocytes: 8 %
Neutrophils Absolute: 2.9 10*3/uL (ref 1.4–7.0)
Neutrophils: 52 %
Platelets: 235 10*3/uL (ref 150–450)
RBC: 4.18 x10E6/uL (ref 4.14–5.80)
RDW: 11.8 % (ref 11.6–15.4)
WBC: 5.6 10*3/uL (ref 3.4–10.8)

## 2018-11-25 LAB — LIPID PANEL
Chol/HDL Ratio: 3.3 ratio (ref 0.0–5.0)
Cholesterol, Total: 189 mg/dL (ref 100–199)
HDL: 58 mg/dL (ref >39)
LDL Calculated: 114 mg/dL — ABNORMAL HIGH (ref 0–99)
Triglycerides: 83 mg/dL (ref 0–149)
VLDL Cholesterol Cal: 17 mg/dL (ref 5–40)

## 2018-11-25 LAB — HEMOGLOBIN A1C
Est. average glucose Bld gHb Est-mCnc: 194 mg/dL
Hgb A1c MFr Bld: 8.4 % — ABNORMAL HIGH (ref 4.8–5.6)

## 2018-11-25 MED ORDER — GLIPIZIDE 5 MG PO TABS: 5.0000 mg | ORAL_TABLET | Freq: Every day | ORAL | 3 refills | Status: DC

## 2019-01-19 ENCOUNTER — Other Ambulatory Visit: Payer: Self-pay

## 2019-01-19 ENCOUNTER — Encounter: Payer: Self-pay | Admitting: Pulmonary Disease

## 2019-01-19 ENCOUNTER — Ambulatory Visit (INDEPENDENT_AMBULATORY_CARE_PROVIDER_SITE_OTHER): Payer: Medicare Other | Admitting: Pulmonary Disease

## 2019-01-19 VITALS — BP 170/90 | HR 55 | Temp 97.3°F | Wt 162.2 lb

## 2019-01-19 DIAGNOSIS — R911 Solitary pulmonary nodule: Secondary | ICD-10-CM

## 2019-01-19 DIAGNOSIS — R590 Localized enlarged lymph nodes: Secondary | ICD-10-CM | POA: Diagnosis not present

## 2019-01-19 DIAGNOSIS — R918 Other nonspecific abnormal finding of lung field: Secondary | ICD-10-CM | POA: Diagnosis not present

## 2019-01-19 DIAGNOSIS — Z87891 Personal history of nicotine dependence: Secondary | ICD-10-CM | POA: Diagnosis not present

## 2019-01-19 MED ORDER — ANORO ELLIPTA 62.5-25 MCG/INH IN AEPB: 1.0000 | INHALATION_SPRAY | Freq: Every day | RESPIRATORY_TRACT | 5 refills | Status: DC

## 2019-01-19 MED ORDER — ANORO ELLIPTA 62.5-25 MCG/INH IN AEPB: 1.0000 | INHALATION_SPRAY | Freq: Every day | RESPIRATORY_TRACT | 0 refills | Status: DC

## 2019-01-19 MED ORDER — ALBUTEROL SULFATE HFA 108 (90 BASE) MCG/ACT IN AERS: 2.0000 | INHALATION_SPRAY | Freq: Four times a day (QID) | RESPIRATORY_TRACT | 6 refills | Status: AC | PRN

## 2019-01-19 NOTE — Patient Instructions (Addendum)
Thank you for visiting Dr. Valeta Harms at [facility]. Today we recommend the following: Orders Placed This Encounter  Procedures  . CT Super D Chest Wo Contrast   Meds ordered this encounter  Medications  . umeclidinium-vilanterol (ANORO ELLIPTA) 62.5-25 MCG/INH AEPB    Sig: Inhale 1 puff into the lungs daily.    Dispense:  1 each    Refill:  0    Order Specific Question:   Lot Number?    Answer:   [id number]    Order Specific Question:   Expiration Date?    Answer:   09/21/2019    Order Specific Question:   Manufacturer?    Answer:   [employer] [[id number]]    Order Specific Question:   Quantity    Answer:   1  . albuterol (VENTOLIN HFA) 108 (90 Base) MCG/ACT inhaler    Sig: Inhale 2 puffs into the lungs every 6 (six) hours as needed for wheezing or shortness of breath.    Dispense:  18 g    Refill:  6   Return in about 6 weeks (around 03/02/2019).    Please do your part to reduce the spread of COVID-19.

## 2019-01-19 NOTE — H&P (View-Only) (Signed)
Synopsis: Referred in March 2020 for RUL lung nodule by Horald Pollen, *  Subjective:   PATIENT ID: Benjamin Willis. GENDER: male DOB: Dec 29, 1945, MRN: [id number]  Chief Complaint  Patient presents with  . Follow-up    Reports no new concerns. States his breathing has been great. PET Scan in March.     This is a 73 year old gentleman seen today in follow-up after recent low-dose lung cancer screening CT which revealed a right upper lobe pulmonary nodule followed by PET scan which revealed PET avid uptake within the lung nodule as well as a slightly increased activity within a 6 mm paratracheal lymph node that was just above the mediastinal blood pool SUV.  At baseline patient has some shortness of breath with significant exertion however otherwise has been doing well.  Every morning he does have a productive cough of cloudy white sputum.  Otherwise the patient is able to complete all activities of daily living.  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  He did have recent pulmonary function test completed.  He did have difficulty with bleeding spirometry.  We will plan to repeat off spirometry today to have this done.  OV 01/19/2019: Patient last seen in our office for evaluation of left upper lobe PET avid pulmonary nodule.  Patient was seen by cardiothoracic surgery August 24, 2018 Dr. Roxan Hockey recommended VATS wedge resection and possible upper lobectomy.  Patient was scheduled for Wednesday, 09/08/2018.  Patient called and canceled his surgical procedure because he decided he did not want "anyone to come them".  Unfortunately he was failed to follow-up during COVID closures and rescheduling's.  Patient seen today for the first time since March.  Otherwise his breathing has been doing well.  Currently managed on Anoro Ellipta plus albuterol as needed.   Past Medical History:  Diagnosis Date  . Allergy   . Diabetes mellitus without complication ([place])   .  Hypertension      Family History  Problem Relation Age of Onset  . Lung cancer Neg Hx      Past Surgical History:  Procedure Laterality Date  . none      Social History   Socioeconomic History  . Marital status: Single    Spouse name: Not on file  . Number of children: 0  . Years of education: Not on file  . Highest education level: 9th grade  Occupational History  . Not on file  Social Needs  . Financial resource strain: Not hard at all  . Food insecurity    Worry: Never true    Inability: Never true  . Transportation needs    Medical: No    Non-medical: No  Tobacco Use  . Smoking status: Former Smoker    Packs/day: 1.00    Years: 48.00    Pack years: 48.00    Quit date: 02/20/2015    Years since quitting: 3.9  . Smokeless tobacco: Former Systems developer    Quit date: 10/2013  Substance and Sexual Activity  . Alcohol use: No    Frequency: Never  . Drug use: No  . Sexual activity: Yes    Birth control/protection: Condom  Lifestyle  . Physical activity    Days per week: 0 days    Minutes per session: 0 min  . Stress: Not at all  Relationships  . Social connections    Talks on phone: More than three times a week    Gets together: More than three  times a week    Attends religious service: More than 4 times per year    Active member of club or organization: No    Attends meetings of clubs or organizations: Never    Relationship status: Never married  . Intimate partner violence    Fear of current or ex partner: No    Emotionally abused: No    Physically abused: No    Forced sexual activity: No  Other Topics Concern  . Not on file  Social History Narrative  . Not on file     No Known Allergies   Outpatient Medications Prior to Visit  Medication Sig Dispense Refill  . atorvastatin (LIPITOR) 20 MG tablet Take 1 tablet (20 mg total) by mouth daily. 90 tablet 3  . cetirizine (ZYRTEC) 10 MG tablet Take 1 tablet (10 mg total) by mouth daily. 30 tablet 11  .  glipiZIDE (GLUCOTROL) 5 MG tablet Take 1 tablet (5 mg total) by mouth daily before breakfast. 60 tablet 3  . lisinopril (PRINIVIL,ZESTRIL) 20 MG tablet Take 1 tablet (20 mg total) by mouth daily. 90 tablet 3  . metFORMIN (GLUCOPHAGE) 500 MG tablet Take 2 TABLETS BY MOUTH DAILY WITH FOOD (Patient taking differently: Take 500 mg by mouth daily. DAILY WITH FOOD) 180 tablet 3  . Multiple Vitamins-Minerals (MULTIVITAMIN MEN) TABS Take 1 tablet by mouth daily.    Marland Kitchen umeclidinium-vilanterol (ANORO ELLIPTA) 62.5-25 MCG/INH AEPB Inhale 1 puff into the lungs daily. 1 each 0  . fluticasone (FLONASE) 50 MCG/ACT nasal spray Place 2 sprays into both nostrils daily. (Patient not taking: Reported on 01/19/2019) 16 g 11   No facility-administered medications prior to visit.     Review of Systems  Constitutional: Negative for chills, fever, malaise/fatigue and weight loss.  HENT: Negative for hearing loss, sore throat and tinnitus.   Eyes: Negative for blurred vision and double vision.  Respiratory: Positive for shortness of breath. Negative for cough, hemoptysis, sputum production, wheezing and stridor.   Cardiovascular: Negative for chest pain, palpitations, orthopnea, leg swelling and PND.  Gastrointestinal: Negative for abdominal pain, constipation, diarrhea, heartburn, nausea and vomiting.  Genitourinary: Negative for dysuria, hematuria and urgency.  Musculoskeletal: Negative for joint pain and myalgias.  Skin: Negative for itching and rash.  Neurological: Negative for dizziness, tingling, weakness and headaches.  Endo/Heme/Allergies: Negative for environmental allergies. Does not bruise/bleed easily.  Psychiatric/Behavioral: Negative for depression. The patient is not nervous/anxious and does not have insomnia.   All other systems reviewed and are negative.    Objective:  Physical Exam Vitals signs reviewed.  Constitutional:      General: He is not in acute distress.    Appearance: He is  well-developed.  HENT:     Head: Normocephalic and atraumatic.     Mouth/Throat:     Pharynx: No oropharyngeal exudate.  Eyes:     Conjunctiva/sclera: Conjunctivae normal.     Pupils: Pupils are equal, round, and reactive to light.  Neck:     Vascular: No JVD.     Trachea: No tracheal deviation.     Comments: Loss of supraclavicular fat Cardiovascular:     Rate and Rhythm: Normal rate and regular rhythm.     Heart sounds: S1 normal and S2 normal.     Comments: Distant heart tones Pulmonary:     Effort: No tachypnea or accessory muscle usage.     Breath sounds: No stridor. Decreased breath sounds (throughout all lung fields) present. No wheezing, rhonchi or rales.  Abdominal:     General: Bowel sounds are normal. There is no distension.     Palpations: Abdomen is soft.     Tenderness: There is no abdominal tenderness.  Musculoskeletal:        General: Deformity (muscle wasting ) present.  Skin:    General: Skin is warm and dry.     Capillary Refill: Capillary refill takes less than 2 seconds.     Findings: No rash.  Neurological:     Mental Status: He is alert and oriented to person, place, and time.  Psychiatric:        Behavior: Behavior normal.      Vitals:   01/19/19 1010  BP: (!) 170/90  Pulse: (!) 55  Temp: (!) 97.3 F (36.3 C)  TempSrc: Temporal  SpO2: 100%  Weight: 162 lb 3.2 oz (73.6 kg)   100% on RA BMI Readings from Last 3 Encounters:  01/19/19 25.79 kg/m  11/24/18 26.46 kg/m  08/24/18 26.20 kg/m   Wt Readings from Last 3 Encounters:  01/19/19 162 lb 3.2 oz (73.6 kg)  11/24/18 166 lb 6.4 oz (75.5 kg)  08/24/18 164 lb 12.8 oz (74.8 kg)     CBC    Component Value Date/Time   WBC 5.6 11/24/2018 0945   WBC 8.5 02/19/2017 1146   WBC 6.6 12/21/2014 1213   RBC 4.18 11/24/2018 0945   RBC 4.28 (A) 02/19/2017 1146   RBC 4.25 12/21/2014 1213   HGB 12.9 (L) 11/24/2018 0945   HCT 37.8 11/24/2018 0945   PLT 235 11/24/2018 0945   MCV 90 11/24/2018  0945   MCH 30.9 11/24/2018 0945   MCH 30.5 02/19/2017 1146   MCH 30.6 12/21/2014 1213   MCHC 34.1 11/24/2018 0945   MCHC 32.8 02/19/2017 1146   MCHC 32.7 12/21/2014 1213   RDW 11.8 11/24/2018 0945   LYMPHSABS 2.1 11/24/2018 0945   MONOABS 0.9 12/21/2014 1213   EOSABS 0.1 11/24/2018 0945   BASOSABS 0.0 11/24/2018 0945   Chest Imaging: 07/20/2018: PET SCAN  IMPRESSION: 1. 13 mm right upper lobe pulmonary nodule is hypermetabolic consistent with primary bronchogenic neoplasm. 2. 6 mm short axis high right paratracheal node is within normal limits for size but shows low level hypermetabolism. Although no hypermetabolic disease is evident in the right hilum, metastatic involvement of the right paratracheal node can not be excluded.  Pulmonary Functions Testing Results: PFT Results Latest Ref Rng & Units 07/13/2018  DLCO UNC% % 87  DLCO COR %Predicted % 144  TLC L 4.47  TLC % Predicted % 69  RV % Predicted % 103    07/22/2018 office spirometry: Ratio 76, FEV1 98%, FEV1 2.4 L normal spirometry  FeNO: None   Pathology: None   Echocardiogram: None   Heart Catheterization: None     Assessment & Plan:   Nodule of upper lobe of right lung  Mediastinal adenopathy  Former smoker  Abnormal findings on diagnostic imaging of lung  Discussion:   73 year old gentleman diagnosed with a right upper lobe pulmonary nodule that was PET avid concerning for primary bronchogenic carcinoma back in March 2020.  He was seen by cardiothoracic surgery scheduled for a VATS wedge resection however the patient called and subsequently declined surgical intervention.  Unfortunately was failed to follow-up since then.  At this point I think that he needs to repeat additional images since it has been several months.  We did discuss potential for referral to radiation oncology for treatments if he was unwilling  to have surgery.  Therefore we would like to proceed with potential tissue diagnosis at  least.  We will send patient for a super D CT today.  We discussed today plans for a electromagnetic navigational bronchoscopy for tissue sampling to include radial endobronchial ultrasound peripheral targeting and possible fiducial placement.  Pending the super D CT we may need to reevaluate the mediastinal adenopathy that was seen previously.  It has been several months and would likely increase his risk for mediastinal spread and a higher staged cancer at this point.  I will also update the patient's sister.  Patient to return to the office for follow-up in 6 weeks.  Likely will be post procedure at this time.  Greater than 50% of this patient's 40-minute of visit was spent face-to-face discussing the above recommendations and treatment plan.  We also reviewed the patient's images.  We also discussed risk benefits and alternatives of proceeding with procedure for tissue diagnosis and concern for his abnormal pet imaging that was completed back in March.     Current Outpatient Medications:  .  atorvastatin (LIPITOR) 20 MG tablet, Take 1 tablet (20 mg total) by mouth daily., Disp: 90 tablet, Rfl: 3 .  cetirizine (ZYRTEC) 10 MG tablet, Take 1 tablet (10 mg total) by mouth daily., Disp: 30 tablet, Rfl: 11 .  glipiZIDE (GLUCOTROL) 5 MG tablet, Take 1 tablet (5 mg total) by mouth daily before breakfast., Disp: 60 tablet, Rfl: 3 .  lisinopril (PRINIVIL,ZESTRIL) 20 MG tablet, Take 1 tablet (20 mg total) by mouth daily., Disp: 90 tablet, Rfl: 3 .  metFORMIN (GLUCOPHAGE) 500 MG tablet, Take 2 TABLETS BY MOUTH DAILY WITH FOOD (Patient taking differently: Take 500 mg by mouth daily. DAILY WITH FOOD), Disp: 180 tablet, Rfl: 3 .  Multiple Vitamins-Minerals (MULTIVITAMIN MEN) TABS, Take 1 tablet by mouth daily., Disp: , Rfl:  .  umeclidinium-vilanterol (ANORO ELLIPTA) 62.5-25 MCG/INH AEPB, Inhale 1 puff into the lungs daily., Disp: 1 each, Rfl: 0   Garner Nash, DO [facility]  01/19/2019 10:23 AM

## 2019-01-19 NOTE — Addendum Note (Signed)
Addended by: Vivia Ewing on: 01/19/2019 10:51 AM   Modules accepted: Orders

## 2019-01-19 NOTE — Progress Notes (Signed)
Synopsis: Referred in March 2020 for RUL lung nodule by Horald Pollen, *  Subjective:   PATIENT ID: Benjamin Willis. GENDER: male DOB: May 26, 1945, MRN: [id number]  Chief Complaint  Patient presents with  . Follow-up    Reports no new concerns. States his breathing has been great. PET Scan in March.     This is a 73 year old gentleman seen today in follow-up after recent low-dose lung cancer screening CT which revealed a right upper lobe pulmonary nodule followed by PET scan which revealed PET avid uptake within the lung nodule as well as a slightly increased activity within a 6 mm paratracheal lymph node that was just above the mediastinal blood pool SUV.  At baseline patient has some shortness of breath with significant exertion however otherwise has been doing well.  Every morning he does have a productive cough of cloudy white sputum.  Otherwise the patient is able to complete all activities of daily living.  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  He did have recent pulmonary function test completed.  He did have difficulty with bleeding spirometry.  We will plan to repeat off spirometry today to have this done.  OV 01/19/2019: Patient last seen in our office for evaluation of left upper lobe PET avid pulmonary nodule.  Patient was seen by cardiothoracic surgery August 24, 2018 Dr. Roxan Hockey recommended VATS wedge resection and possible upper lobectomy.  Patient was scheduled for Wednesday, 09/08/2018.  Patient called and canceled his surgical procedure because he decided he did not want "anyone to come them".  Unfortunately he was failed to follow-up during COVID closures and rescheduling's.  Patient seen today for the first time since March.  Otherwise his breathing has been doing well.  Currently managed on Anoro Ellipta plus albuterol as needed.   Past Medical History:  Diagnosis Date  . Allergy   . Diabetes mellitus without complication ([place])   .  Hypertension      Family History  Problem Relation Age of Onset  . Lung cancer Neg Hx      Past Surgical History:  Procedure Laterality Date  . none      Social History   Socioeconomic History  . Marital status: Single    Spouse name: Not on file  . Number of children: 0  . Years of education: Not on file  . Highest education level: 9th grade  Occupational History  . Not on file  Social Needs  . Financial resource strain: Not hard at all  . Food insecurity    Worry: Never true    Inability: Never true  . Transportation needs    Medical: No    Non-medical: No  Tobacco Use  . Smoking status: Former Smoker    Packs/day: 1.00    Years: 48.00    Pack years: 48.00    Quit date: 02/20/2015    Years since quitting: 3.9  . Smokeless tobacco: Former Systems developer    Quit date: 10/2013  Substance and Sexual Activity  . Alcohol use: No    Frequency: Never  . Drug use: No  . Sexual activity: Yes    Birth control/protection: Condom  Lifestyle  . Physical activity    Days per week: 0 days    Minutes per session: 0 min  . Stress: Not at all  Relationships  . Social connections    Talks on phone: More than three times a week    Gets together: More than three  times a week    Attends religious service: More than 4 times per year    Active member of club or organization: No    Attends meetings of clubs or organizations: Never    Relationship status: Never married  . Intimate partner violence    Fear of current or ex partner: No    Emotionally abused: No    Physically abused: No    Forced sexual activity: No  Other Topics Concern  . Not on file  Social History Narrative  . Not on file     No Known Allergies   Outpatient Medications Prior to Visit  Medication Sig Dispense Refill  . atorvastatin (LIPITOR) 20 MG tablet Take 1 tablet (20 mg total) by mouth daily. 90 tablet 3  . cetirizine (ZYRTEC) 10 MG tablet Take 1 tablet (10 mg total) by mouth daily. 30 tablet 11  .  glipiZIDE (GLUCOTROL) 5 MG tablet Take 1 tablet (5 mg total) by mouth daily before breakfast. 60 tablet 3  . lisinopril (PRINIVIL,ZESTRIL) 20 MG tablet Take 1 tablet (20 mg total) by mouth daily. 90 tablet 3  . metFORMIN (GLUCOPHAGE) 500 MG tablet Take 2 TABLETS BY MOUTH DAILY WITH FOOD (Patient taking differently: Take 500 mg by mouth daily. DAILY WITH FOOD) 180 tablet 3  . Multiple Vitamins-Minerals (MULTIVITAMIN MEN) TABS Take 1 tablet by mouth daily.    Marland Kitchen umeclidinium-vilanterol (ANORO ELLIPTA) 62.5-25 MCG/INH AEPB Inhale 1 puff into the lungs daily. 1 each 0  . fluticasone (FLONASE) 50 MCG/ACT nasal spray Place 2 sprays into both nostrils daily. (Patient not taking: Reported on 01/19/2019) 16 g 11   No facility-administered medications prior to visit.     Review of Systems  Constitutional: Negative for chills, fever, malaise/fatigue and weight loss.  HENT: Negative for hearing loss, sore throat and tinnitus.   Eyes: Negative for blurred vision and double vision.  Respiratory: Positive for shortness of breath. Negative for cough, hemoptysis, sputum production, wheezing and stridor.   Cardiovascular: Negative for chest pain, palpitations, orthopnea, leg swelling and PND.  Gastrointestinal: Negative for abdominal pain, constipation, diarrhea, heartburn, nausea and vomiting.  Genitourinary: Negative for dysuria, hematuria and urgency.  Musculoskeletal: Negative for joint pain and myalgias.  Skin: Negative for itching and rash.  Neurological: Negative for dizziness, tingling, weakness and headaches.  Endo/Heme/Allergies: Negative for environmental allergies. Does not bruise/bleed easily.  Psychiatric/Behavioral: Negative for depression. The patient is not nervous/anxious and does not have insomnia.   All other systems reviewed and are negative.    Objective:  Physical Exam Vitals signs reviewed.  Constitutional:      General: He is not in acute distress.    Appearance: He is  well-developed.  HENT:     Head: Normocephalic and atraumatic.     Mouth/Throat:     Pharynx: No oropharyngeal exudate.  Eyes:     Conjunctiva/sclera: Conjunctivae normal.     Pupils: Pupils are equal, round, and reactive to light.  Neck:     Vascular: No JVD.     Trachea: No tracheal deviation.     Comments: Loss of supraclavicular fat Cardiovascular:     Rate and Rhythm: Normal rate and regular rhythm.     Heart sounds: S1 normal and S2 normal.     Comments: Distant heart tones Pulmonary:     Effort: No tachypnea or accessory muscle usage.     Breath sounds: No stridor. Decreased breath sounds (throughout all lung fields) present. No wheezing, rhonchi or rales.  Abdominal:     General: Bowel sounds are normal. There is no distension.     Palpations: Abdomen is soft.     Tenderness: There is no abdominal tenderness.  Musculoskeletal:        General: Deformity (muscle wasting ) present.  Skin:    General: Skin is warm and dry.     Capillary Refill: Capillary refill takes less than 2 seconds.     Findings: No rash.  Neurological:     Mental Status: He is alert and oriented to person, place, and time.  Psychiatric:        Behavior: Behavior normal.      Vitals:   01/19/19 1010  BP: (!) 170/90  Pulse: (!) 55  Temp: (!) 97.3 F (36.3 C)  TempSrc: Temporal  SpO2: 100%  Weight: 162 lb 3.2 oz (73.6 kg)   100% on RA BMI Readings from Last 3 Encounters:  01/19/19 25.79 kg/m  11/24/18 26.46 kg/m  08/24/18 26.20 kg/m   Wt Readings from Last 3 Encounters:  01/19/19 162 lb 3.2 oz (73.6 kg)  11/24/18 166 lb 6.4 oz (75.5 kg)  08/24/18 164 lb 12.8 oz (74.8 kg)     CBC    Component Value Date/Time   WBC 5.6 11/24/2018 0945   WBC 8.5 02/19/2017 1146   WBC 6.6 12/21/2014 1213   RBC 4.18 11/24/2018 0945   RBC 4.28 (A) 02/19/2017 1146   RBC 4.25 12/21/2014 1213   HGB 12.9 (L) 11/24/2018 0945   HCT 37.8 11/24/2018 0945   PLT 235 11/24/2018 0945   MCV 90 11/24/2018  0945   MCH 30.9 11/24/2018 0945   MCH 30.5 02/19/2017 1146   MCH 30.6 12/21/2014 1213   MCHC 34.1 11/24/2018 0945   MCHC 32.8 02/19/2017 1146   MCHC 32.7 12/21/2014 1213   RDW 11.8 11/24/2018 0945   LYMPHSABS 2.1 11/24/2018 0945   MONOABS 0.9 12/21/2014 1213   EOSABS 0.1 11/24/2018 0945   BASOSABS 0.0 11/24/2018 0945   Chest Imaging: 07/20/2018: PET SCAN  IMPRESSION: 1. 13 mm right upper lobe pulmonary nodule is hypermetabolic consistent with primary bronchogenic neoplasm. 2. 6 mm short axis high right paratracheal node is within normal limits for size but shows low level hypermetabolism. Although no hypermetabolic disease is evident in the right hilum, metastatic involvement of the right paratracheal node can not be excluded.  Pulmonary Functions Testing Results: PFT Results Latest Ref Rng & Units 07/13/2018  DLCO UNC% % 87  DLCO COR %Predicted % 144  TLC L 4.47  TLC % Predicted % 69  RV % Predicted % 103    07/22/2018 office spirometry: Ratio 76, FEV1 98%, FEV1 2.4 L normal spirometry  FeNO: None   Pathology: None   Echocardiogram: None   Heart Catheterization: None     Assessment & Plan:   Nodule of upper lobe of right lung  Mediastinal adenopathy  Former smoker  Abnormal findings on diagnostic imaging of lung  Discussion:   73 year old gentleman diagnosed with a right upper lobe pulmonary nodule that was PET avid concerning for primary bronchogenic carcinoma back in March 2020.  He was seen by cardiothoracic surgery scheduled for a VATS wedge resection however the patient called and subsequently declined surgical intervention.  Unfortunately was failed to follow-up since then.  At this point I think that he needs to repeat additional images since it has been several months.  We did discuss potential for referral to radiation oncology for treatments if he was unwilling  to have surgery.  Therefore we would like to proceed with potential tissue diagnosis at  least.  We will send patient for a super D CT today.  We discussed today plans for a electromagnetic navigational bronchoscopy for tissue sampling to include radial endobronchial ultrasound peripheral targeting and possible fiducial placement.  Pending the super D CT we may need to reevaluate the mediastinal adenopathy that was seen previously.  It has been several months and would likely increase his risk for mediastinal spread and a higher staged cancer at this point.  I will also update the patient's sister.  Patient to return to the office for follow-up in 6 weeks.  Likely will be post procedure at this time.  Greater than 50% of this patient's 40-minute of visit was spent face-to-face discussing the above recommendations and treatment plan.  We also reviewed the patient's images.  We also discussed risk benefits and alternatives of proceeding with procedure for tissue diagnosis and concern for his abnormal pet imaging that was completed back in March.     Current Outpatient Medications:  .  atorvastatin (LIPITOR) 20 MG tablet, Take 1 tablet (20 mg total) by mouth daily., Disp: 90 tablet, Rfl: 3 .  cetirizine (ZYRTEC) 10 MG tablet, Take 1 tablet (10 mg total) by mouth daily., Disp: 30 tablet, Rfl: 11 .  glipiZIDE (GLUCOTROL) 5 MG tablet, Take 1 tablet (5 mg total) by mouth daily before breakfast., Disp: 60 tablet, Rfl: 3 .  lisinopril (PRINIVIL,ZESTRIL) 20 MG tablet, Take 1 tablet (20 mg total) by mouth daily., Disp: 90 tablet, Rfl: 3 .  metFORMIN (GLUCOPHAGE) 500 MG tablet, Take 2 TABLETS BY MOUTH DAILY WITH FOOD (Patient taking differently: Take 500 mg by mouth daily. DAILY WITH FOOD), Disp: 180 tablet, Rfl: 3 .  Multiple Vitamins-Minerals (MULTIVITAMIN MEN) TABS, Take 1 tablet by mouth daily., Disp: , Rfl:  .  umeclidinium-vilanterol (ANORO ELLIPTA) 62.5-25 MCG/INH AEPB, Inhale 1 puff into the lungs daily., Disp: 1 each, Rfl: 0   Garner Nash, DO [facility]  01/19/2019 10:23 AM

## 2019-01-19 NOTE — Addendum Note (Signed)
Addended by: Vivia Ewing on: 01/19/2019 11:04 AM   Modules accepted: Orders

## 2019-01-24 ENCOUNTER — Other Ambulatory Visit: Payer: Self-pay

## 2019-01-24 ENCOUNTER — Ambulatory Visit (INDEPENDENT_AMBULATORY_CARE_PROVIDER_SITE_OTHER)
Admission: RE | Admit: 2019-01-24 | Discharge: 2019-01-24 | Disposition: A | Discharge status: Home / Self Care | Payer: Medicare Other | Source: Ambulatory Visit | Attending: Pulmonary Disease | Admitting: Pulmonary Disease

## 2019-01-24 DIAGNOSIS — R911 Solitary pulmonary nodule: Secondary | ICD-10-CM | POA: Diagnosis not present

## 2019-01-24 IMAGING — CT CT CHEST SUPER D W/O CM
2 of 4 series · 15 of 36 positions shown, 18 images · non-contrast
Comparison: PET-CT [DATE]

CLINICAL DATA: CT planning for tissue sampling. Right upper lobe
pulmonary nodule.

EXAM:
CT CHEST WITHOUT CONTRAST
TECHNIQUE: Multidetector CT imaging of the chest was performed using thin slice
collimation for electromagnetic bronchoscopy planning purposes,
without intravenous contrast.

[Series 4: thins · axial · 0.67mm/px · z∈[-312,-41]mm · 12 of 379 slices shown, 15 images]
[im 20/379  mediastinal]
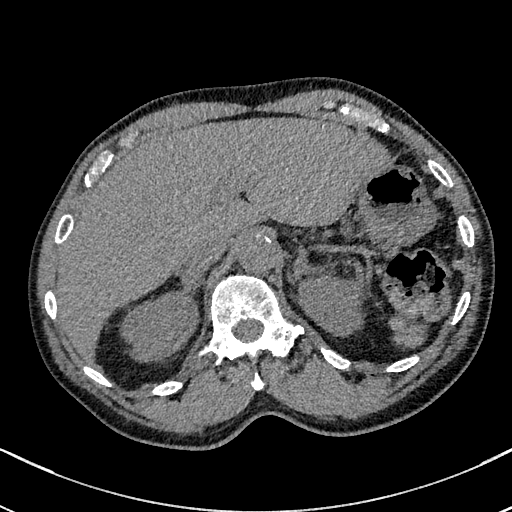
[im 20/379  lung]
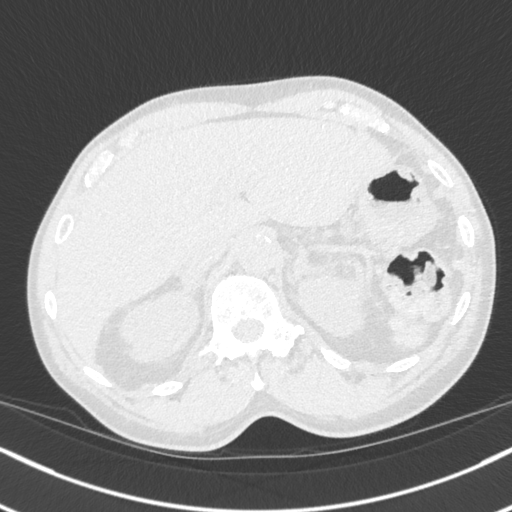
[im 60/379  lung]
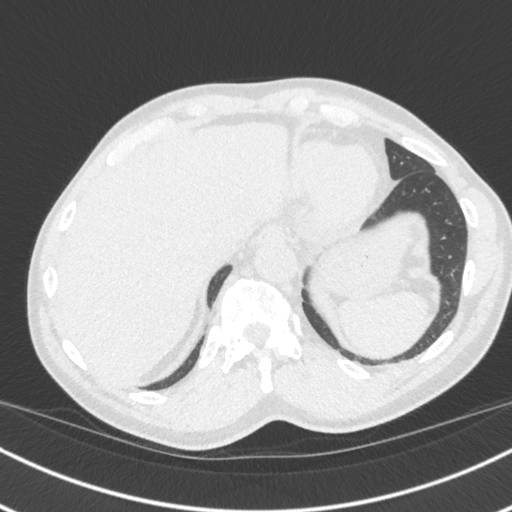
[im 80/379  lung]
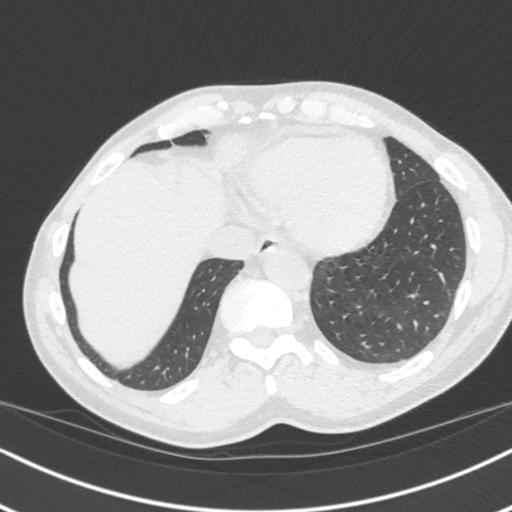
[im 120/379  lung]
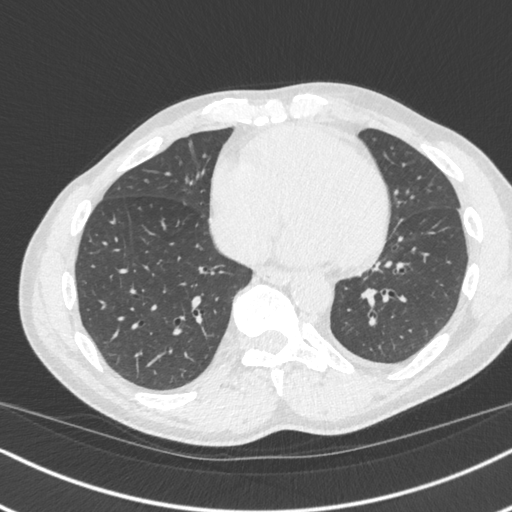
[im 140/379  mediastinal]
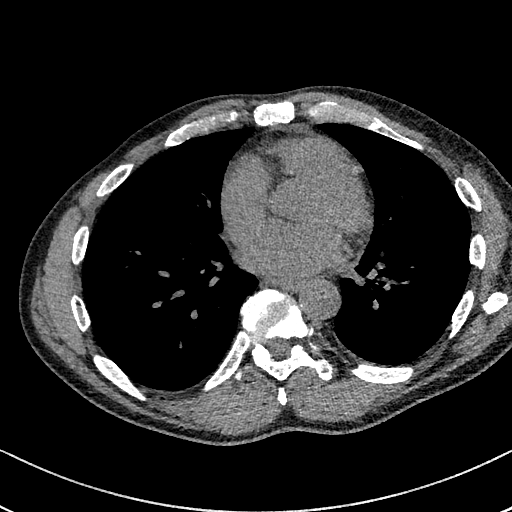
[im 140/379  lung]
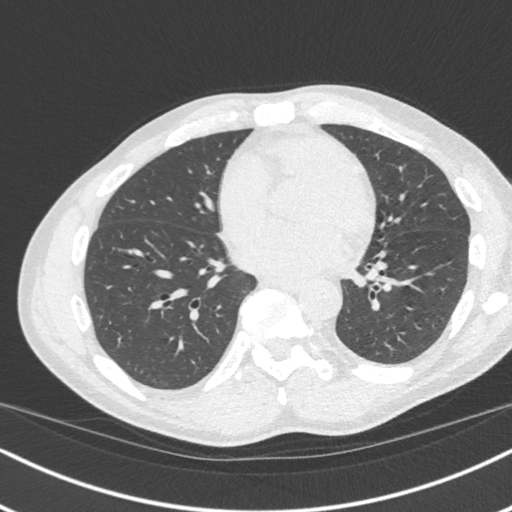
[im 180/379  lung]
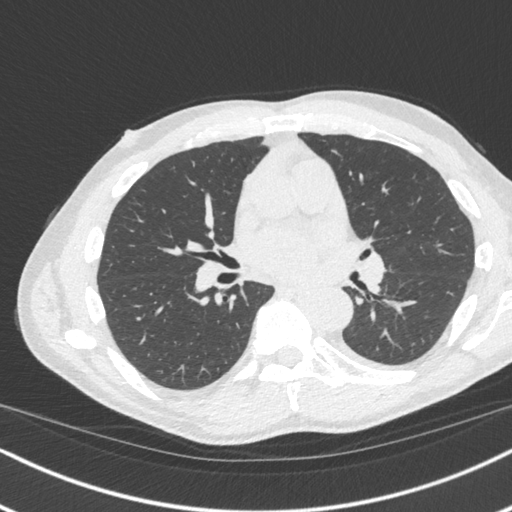
[im 199/379  lung]
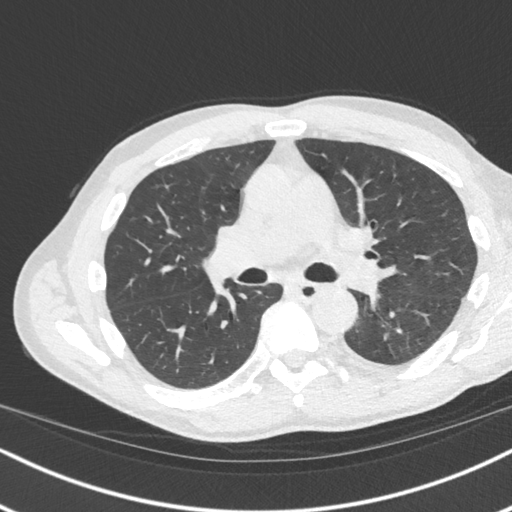
[im 239/379  lung]
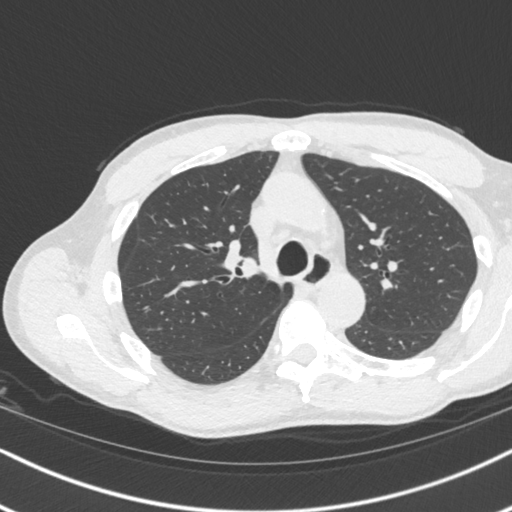
[im 259/379  mediastinal]
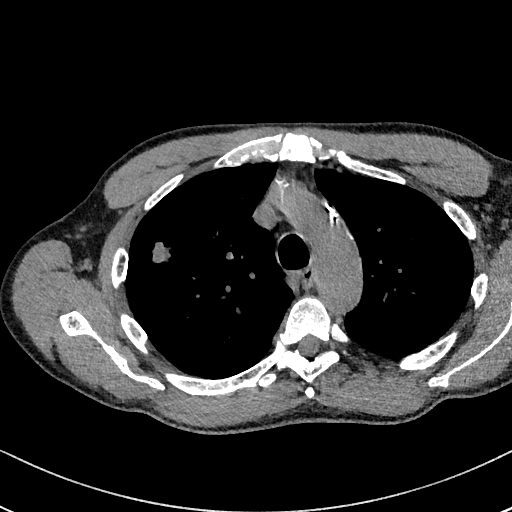
[im 259/379  lung]
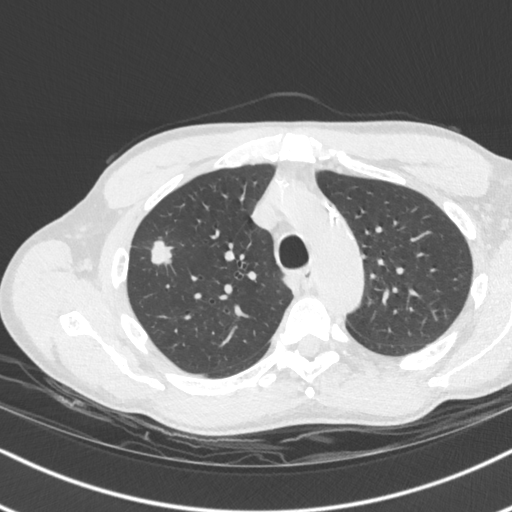
[im 299/379  lung]
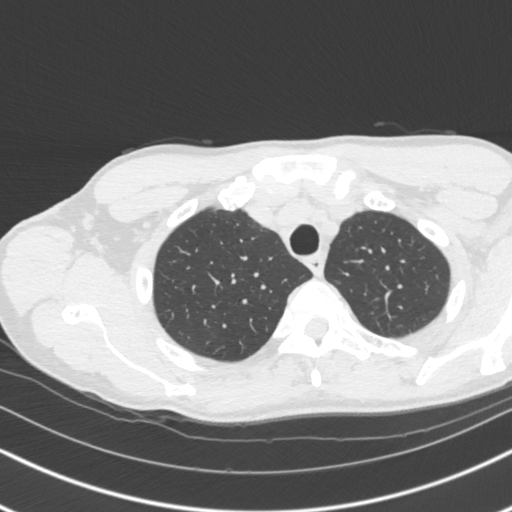
[im 319/379  lung]
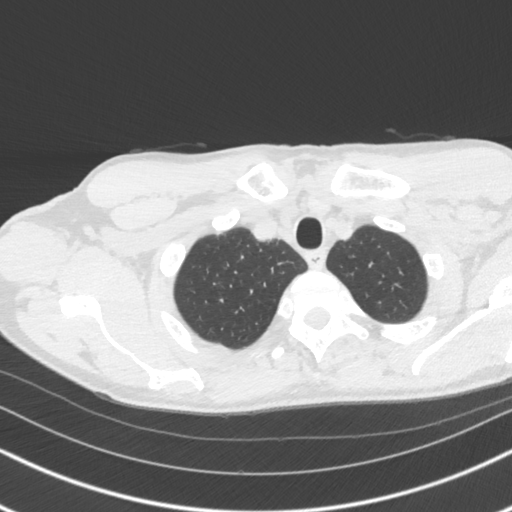
[im 359/379  lung]
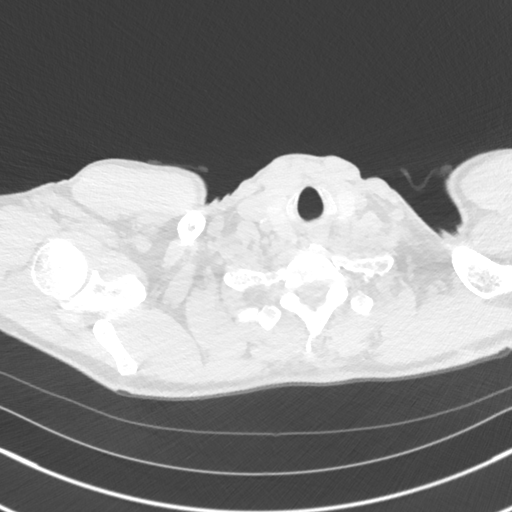

[Series 5: coronal · coronal · 0.59mm/px · 3 of 77 slices shown]
[im 16/77  lung]
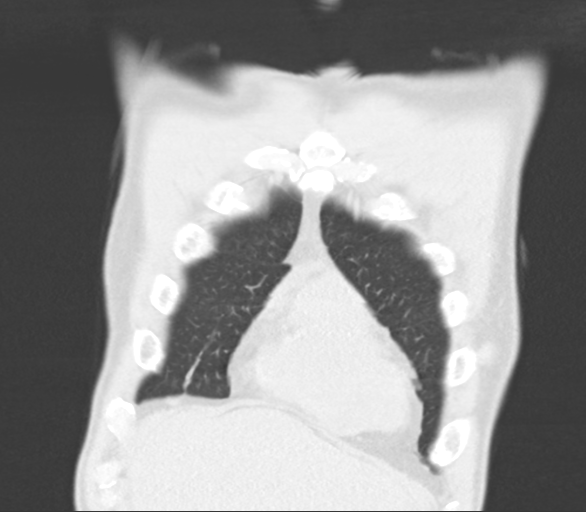
[im 31/77  lung]
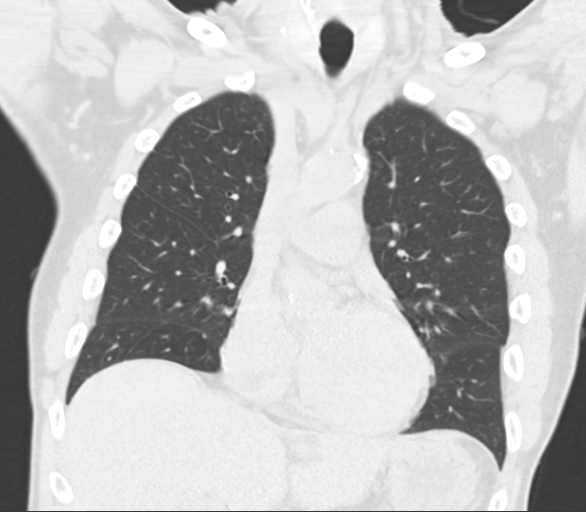
[im 46/77  lung]
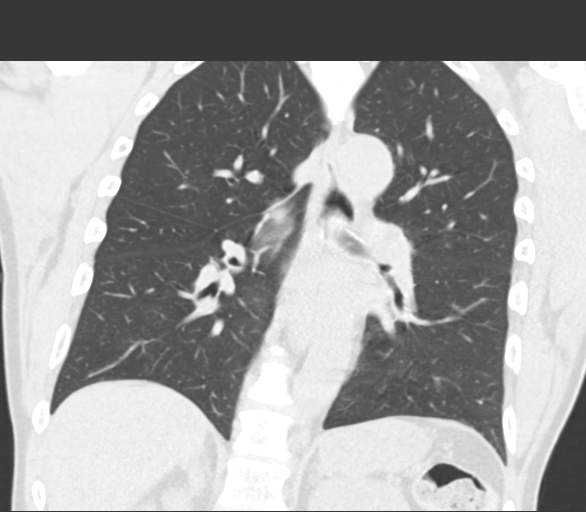

[15 of 36 positions shown; findings below may reference images not displayed]

FINDINGS: Cardiovascular: Heart size normal. Trace pericardial effusion.
Atherosclerotic calcification is noted in the wall of the thoracic
aorta.

Mediastinum/Nodes: No mediastinal lymphadenopathy. No evidence for
gross hilar lymphadenopathy although assessment is limited by the
lack of intravenous contrast on today's study. The esophagus has
normal imaging features. There is no axillary lymphadenopathy.

Lungs/Pleura: Right upper lobe spiculated pulmonary nodule with
lobular contour is progressive in the interval measuring 1.8 x
cm today. Centrilobular emphsyema noted. Subsegmental atelectasis
noted right middle lobe. No focal airspace consolidation. No pleural
effusion.

Upper Abdomen: Unremarkable.

Musculoskeletal: No worrisome lytic or sclerotic osseous
abnormality.
IMPRESSION: 1. Interval progression of the spiculated right upper lobe pulmonary
nodule seen to be hypermetabolic on previous PET-CT. Imaging
features remain compatible with neoplasm.
2. Emphysema.
3. Thoracic aortic atherosclerosis.

Aortic Atherosclerosis ([AK]-[AK]) and Emphysema ([AK]-[AK]).

## 2019-01-30 ENCOUNTER — Telehealth: Payer: Self-pay | Admitting: Pulmonary Disease

## 2019-01-30 DIAGNOSIS — Z01818 Encounter for other preprocedural examination: Secondary | ICD-10-CM

## 2019-01-30 DIAGNOSIS — R911 Solitary pulmonary nodule: Secondary | ICD-10-CM

## 2019-01-30 DIAGNOSIS — R590 Localized enlarged lymph nodes: Secondary | ICD-10-CM

## 2019-01-30 DIAGNOSIS — Z87891 Personal history of nicotine dependence: Secondary | ICD-10-CM

## 2019-01-30 DIAGNOSIS — R0602 Shortness of breath: Secondary | ICD-10-CM

## 2019-01-30 NOTE — Telephone Encounter (Signed)
PCCM:  I called and spoke with the patient as well as the patient's sister regarding his CT findings.  The lung nodule is persistent and slightly larger in comparison to the last imaging.  Patient was initially referred for VATS however decided not to have the surgery for removal.  After being seen in the office they have made the decision to proceed with bronchoscopy for potential tissue diagnosis, possible fiducial placement.  Then would prefer radiation treatments for the lesion.  I have called the [facility].  Patient is scheduled for Wednesday, 02/02/2019 at 1:45 PM Procedure: Video bronchoscopy with navigation, radial endobronchial ultrasound and possible fiducial placement. Diagnosis: Right upper lobe lung nodule  I have spoke to radiology and they are going to make the super D CT for me here at [facility] radiology.  The patient will need COVID preop screening to be completed Monday. We will also need scheduled preop labs.  CC: [place], [employer], [place] Catalina Clenton Pare, DO [facility] 01/30/2019 11:42 AM

## 2019-01-31 ENCOUNTER — Other Ambulatory Visit (INDEPENDENT_AMBULATORY_CARE_PROVIDER_SITE_OTHER): Payer: Medicare Other

## 2019-01-31 ENCOUNTER — Telehealth: Payer: Self-pay | Admitting: Pulmonary Disease

## 2019-01-31 ENCOUNTER — Other Ambulatory Visit: Payer: Self-pay | Admitting: Emergency Medicine

## 2019-01-31 ENCOUNTER — Other Ambulatory Visit (HOSPITAL_COMMUNITY)
Admission: RE | Admit: 2019-01-31 | Discharge: 2019-01-31 | Disposition: A | Discharge status: Home / Self Care | Payer: Medicare Other | Source: Ambulatory Visit | Attending: Pulmonary Disease | Admitting: Pulmonary Disease

## 2019-01-31 DIAGNOSIS — R911 Solitary pulmonary nodule: Secondary | ICD-10-CM

## 2019-01-31 DIAGNOSIS — E119 Type 2 diabetes mellitus without complications: Secondary | ICD-10-CM

## 2019-01-31 DIAGNOSIS — R590 Localized enlarged lymph nodes: Secondary | ICD-10-CM

## 2019-01-31 DIAGNOSIS — R0602 Shortness of breath: Secondary | ICD-10-CM

## 2019-01-31 DIAGNOSIS — Z87891 Personal history of nicotine dependence: Secondary | ICD-10-CM

## 2019-01-31 DIAGNOSIS — I10 Essential (primary) hypertension: Secondary | ICD-10-CM

## 2019-01-31 DIAGNOSIS — Z01812 Encounter for preprocedural laboratory examination: Secondary | ICD-10-CM | POA: Insufficient documentation

## 2019-01-31 DIAGNOSIS — Z01818 Encounter for other preprocedural examination: Secondary | ICD-10-CM | POA: Diagnosis not present

## 2019-01-31 DIAGNOSIS — Z20828 Contact with and (suspected) exposure to other viral communicable diseases: Secondary | ICD-10-CM | POA: Diagnosis not present

## 2019-01-31 LAB — COMPREHENSIVE METABOLIC PANEL
ALT: 27 U/L (ref 0–53)
AST: 16 U/L (ref 0–37)
Albumin: 4 g/dL (ref 3.5–5.2)
Alkaline Phosphatase: 71 U/L (ref 39–117)
BUN: 15 mg/dL (ref 6–23)
CO2: 30 mEq/L (ref 19–32)
Calcium: 9.7 mg/dL (ref 8.4–10.5)
Chloride: 101 mEq/L (ref 96–112)
Creatinine, Ser: 0.86 mg/dL (ref 0.40–1.50)
GFR: 105.87 mL/min (ref >60.00)
Glucose, Bld: 205 mg/dL — ABNORMAL HIGH (ref 70–99)
Potassium: 4.3 mEq/L (ref 3.5–5.1)
Sodium: 139 mEq/L (ref 135–145)
Total Bilirubin: 0.4 mg/dL (ref 0.2–1.2)
Total Protein: 7.1 g/dL (ref 6.0–8.3)

## 2019-01-31 LAB — CBC
HCT: 38.2 % — ABNORMAL LOW (ref 39.0–52.0)
Hemoglobin: 12.3 g/dL — ABNORMAL LOW (ref 13.0–17.0)
MCHC: 32.2 g/dL (ref 30.0–36.0)
MCV: 93.3 fl (ref 78.0–100.0)
Platelets: 392 10*3/uL (ref 150.0–400.0)
RBC: 4.09 Mil/uL — ABNORMAL LOW (ref 4.22–5.81)
RDW: 12.7 % (ref 11.5–15.5)
WBC: 8.1 10*3/uL (ref 4.0–10.5)

## 2019-01-31 LAB — SARS CORONAVIRUS 2 (TAT 6-24 HRS): SARS Coronavirus 2: NEGATIVE

## 2019-01-31 LAB — PROTIME-INR
INR: 1.1 ratio — ABNORMAL HIGH (ref 0.8–1.0)
Prothrombin Time: 13.2 s — ABNORMAL HIGH (ref 9.6–13.1)

## 2019-01-31 LAB — APTT: aPTT: 29.6 s (ref 23.4–32.7)

## 2019-01-31 NOTE — Telephone Encounter (Signed)
[facility] OR has been made aware. Nothing further needed at this time.

## 2019-01-31 NOTE — Telephone Encounter (Signed)
Will leave message open in case someone calls back.

## 2019-01-31 NOTE — Addendum Note (Signed)
Addended by: Vivia Ewing on: 01/31/2019 09:22 AM   Modules accepted: Orders

## 2019-01-31 NOTE — Telephone Encounter (Signed)
PCCM: contact libby or [place] Procedure: bronch with Navigation + fiducial placement  Garner Nash, DO [place] 01/31/2019 2:13 PM

## 2019-01-31 NOTE — Telephone Encounter (Signed)
Noted will route to BJ as high priority as well as send skype.   Will call patient to have him stop by office for pre-op labs.    [facility]J please assist with covid scheduling. Thanks.

## 2019-01-31 NOTE — Telephone Encounter (Signed)
Call made to sister Deanna (DPR), aware patient needs to come in for lab work. Aware patient is scheduled for covid testing today. Aware of bronch location and time as well. Nothing further needed at this time.

## 2019-01-31 NOTE — Telephone Encounter (Signed)
Returned call to [place], [place].  Kimmie stated CPT code is needed for scheduled bronchoscopy added on for Wednesday.  Will route message to [place], [place]

## 2019-01-31 NOTE — Telephone Encounter (Signed)
Attempted to return call x's 4 at number given, [phone].  Recording reached, call cannot be completed as dialed.  Unsure of who left message.  Patient is having bronchoscopy 01/02/19, called [facility], was transferred to several departments with bronchoscopies,  and no one knew who called, or what code was needed.

## 2019-01-31 NOTE — Telephone Encounter (Signed)
Call returned to [facility], given CPT code [phone]. She also inquired as to whether this was out patient or in-patient. I made her aware this is out-patient. She reports I need to call and let the OR know that this needs to be changed to outpatient.

## 2019-01-31 NOTE — Telephone Encounter (Signed)
I have his Super D disc here at the office. Just FYI.

## 2019-02-01 ENCOUNTER — Other Ambulatory Visit (HOSPITAL_COMMUNITY): Payer: Self-pay | Admitting: Pulmonary Disease

## 2019-02-01 ENCOUNTER — Telehealth: Payer: Self-pay | Admitting: Pulmonary Disease

## 2019-02-01 ENCOUNTER — Encounter (HOSPITAL_COMMUNITY): Payer: Self-pay | Admitting: *Deleted

## 2019-02-01 ENCOUNTER — Other Ambulatory Visit: Payer: Self-pay

## 2019-02-01 NOTE — Progress Notes (Addendum)
[name] denies chest pain or shortness of breath. Patient was tested for Covid and has been in quarantine since that time. I instructed patient not take any pills in am, but use Inhaler if needed and bring them with him. Mr Benjamin Willis has Type II diabetes. Patient said that his sister sometimes checks CBG for him.  I called patient's sister Benjamin Willis , I left a message asking her to call me back. I called and left a voice message for Ms Benjamin Willis.  I instructed her to check patient's CBG and if it  is less than 70 to treat it with 4 ounces of a clear juice, apple,or cranberry and recheck CBG in 15 minutes; then call the Pre- Surgery desk for futher instructions,

## 2019-02-01 NOTE — Telephone Encounter (Signed)
Call returned to [name] (dpr), made aware the Bronch is scheduled for tommorrow at [facility]. Made aware to go the main entrance, they will take him to admitting and will later be taken up to the operating room for the actual procedure. Made aware to hold his diabetic medication. Aware of NPO Status after midnight. Voiced understanding. Nothing further needed at this time.

## 2019-02-02 ENCOUNTER — Encounter (HOSPITAL_COMMUNITY): Payer: Self-pay

## 2019-02-02 ENCOUNTER — Encounter (HOSPITAL_COMMUNITY)
Admission: RE | Disposition: A | Discharge status: Home / Self Care | Payer: Self-pay | Source: Home / Self Care | Attending: Pulmonary Disease

## 2019-02-02 ENCOUNTER — Ambulatory Visit (HOSPITAL_COMMUNITY): Payer: Medicare Other | Admitting: Certified Registered Nurse Anesthetist

## 2019-02-02 ENCOUNTER — Ambulatory Visit (HOSPITAL_COMMUNITY): Payer: Medicare Other

## 2019-02-02 ENCOUNTER — Other Ambulatory Visit: Payer: Self-pay

## 2019-02-02 ENCOUNTER — Ambulatory Visit (HOSPITAL_COMMUNITY)
Admission: RE | Admit: 2019-02-02 | Discharge: 2019-02-02 | Disposition: A | Discharge status: Home / Self Care | Payer: Medicare Other | Attending: Pulmonary Disease | Admitting: Pulmonary Disease

## 2019-02-02 DIAGNOSIS — Z79899 Other long term (current) drug therapy: Secondary | ICD-10-CM | POA: Diagnosis not present

## 2019-02-02 DIAGNOSIS — Z7984 Long term (current) use of oral hypoglycemic drugs: Secondary | ICD-10-CM | POA: Insufficient documentation

## 2019-02-02 DIAGNOSIS — Z87891 Personal history of nicotine dependence: Secondary | ICD-10-CM | POA: Insufficient documentation

## 2019-02-02 DIAGNOSIS — C3411 Malignant neoplasm of upper lobe, right bronchus or lung: Secondary | ICD-10-CM | POA: Insufficient documentation

## 2019-02-02 DIAGNOSIS — R911 Solitary pulmonary nodule: Secondary | ICD-10-CM

## 2019-02-02 DIAGNOSIS — E119 Type 2 diabetes mellitus without complications: Secondary | ICD-10-CM | POA: Diagnosis not present

## 2019-02-02 DIAGNOSIS — Z9889 Other specified postprocedural states: Secondary | ICD-10-CM

## 2019-02-02 DIAGNOSIS — I10 Essential (primary) hypertension: Secondary | ICD-10-CM | POA: Insufficient documentation

## 2019-02-02 DIAGNOSIS — Z419 Encounter for procedure for purposes other than remedying health state, unspecified: Secondary | ICD-10-CM

## 2019-02-02 HISTORY — PX: VIDEO BRONCHOSCOPY WITH RADIAL ENDOBRONCHIAL ULTRASOUND: SHX6849

## 2019-02-02 HISTORY — PX: FUDUCIAL PLACEMENT: SHX5083

## 2019-02-02 HISTORY — DX: Hyperlipidemia, unspecified: E78.5

## 2019-02-02 HISTORY — PX: ELECTROMAGNETIC NAVIGATION BROCHOSCOPY: SHX5369

## 2019-02-02 LAB — GLUCOSE, CAPILLARY
Glucose-Capillary: 143 mg/dL — ABNORMAL HIGH (ref 70–99)
Glucose-Capillary: 128 mg/dL — ABNORMAL HIGH (ref 70–99)

## 2019-02-02 IMAGING — DX DG CHEST 1V PORT
1 series · 1 of 1 positions shown · non-contrast
Comparison: CT chest [DATE]

CLINICAL DATA: Status post bronchoscopy.

EXAM:
PORTABLE CHEST 1 VIEW

[chest ap]
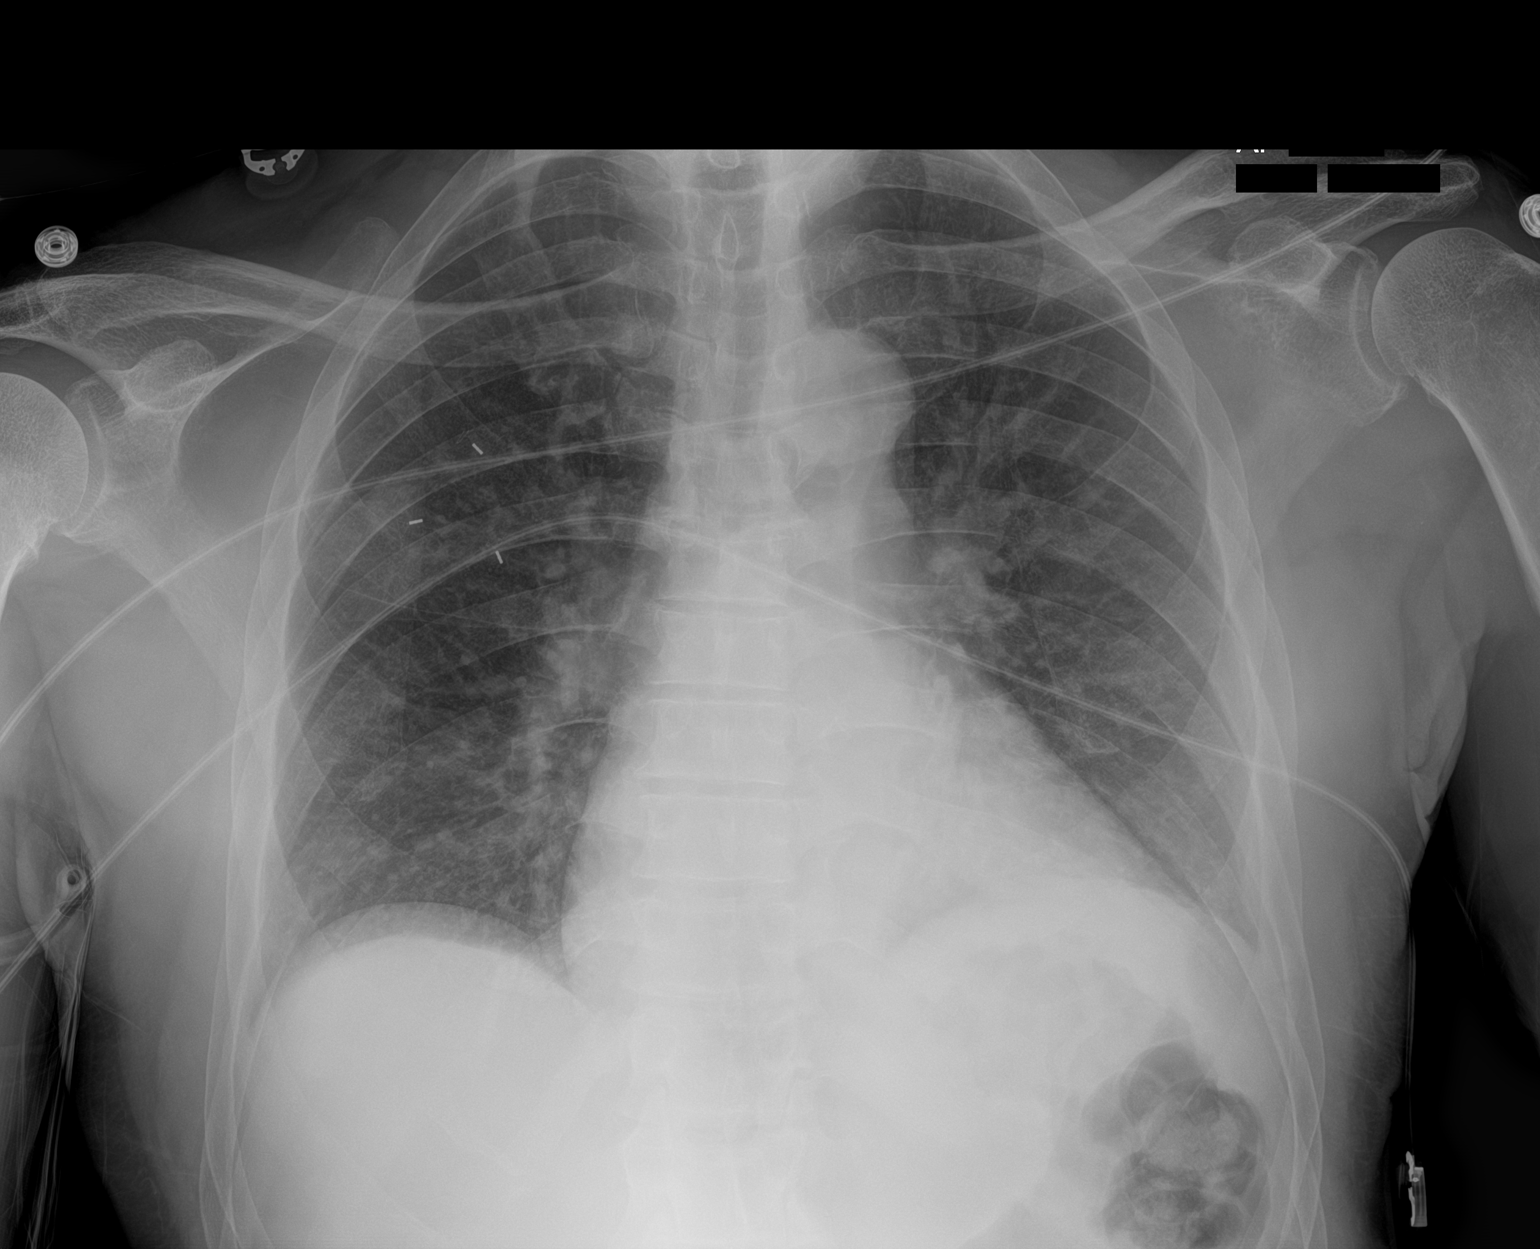

[1 of 1 positions shown; findings below may reference images not displayed]

FINDINGS: Normal heart size. Pulmonary vascular congestion noted. Right upper
lobe pulmonary nodule measuring approximately 2 cm is again noted
with new nearby biopsy clips. No pneumothorax.
IMPRESSION: 1. No pneumothorax status post bronchoscopy.
2. Unchanged right lung nodule.
3. Pulmonary vascular congestion, new.

## 2019-02-02 SURGERY — ELECTROMAGNETIC NAVIGATION BRONCHOSCOPY
Anesthesia: General | Laterality: Right

## 2019-02-02 MED ORDER — SUGAMMADEX SODIUM 200 MG/2ML IV SOLN
INTRAVENOUS | Status: DC | PRN
  Administered 2019-02-02: 200 mg via INTRAVENOUS

## 2019-02-02 MED ORDER — LACTATED RINGERS IV SOLN
INTRAVENOUS | Status: DC
  Administered 2019-02-02 (×2): via INTRAVENOUS

## 2019-02-02 MED ORDER — FENTANYL CITRATE (PF) 250 MCG/5ML IJ SOLN
INTRAMUSCULAR | Status: AC
  Filled 2019-02-02: qty 5

## 2019-02-02 MED ORDER — ONDANSETRON HCL 4 MG/2ML IJ SOLN: 4.0000 mg | Freq: Once | INTRAMUSCULAR | Status: DC | PRN

## 2019-02-02 MED ORDER — DEXAMETHASONE SODIUM PHOSPHATE 10 MG/ML IJ SOLN
INTRAMUSCULAR | Status: DC | PRN
  Administered 2019-02-02: 4 mg via INTRAVENOUS

## 2019-02-02 MED ORDER — FENTANYL CITRATE (PF) 250 MCG/5ML IJ SOLN
INTRAMUSCULAR | Status: DC | PRN
  Administered 2019-02-02: 100 ug via INTRAVENOUS

## 2019-02-02 MED ORDER — ACETAMINOPHEN 500 MG PO TABS
1000.0000 mg | ORAL_TABLET | Freq: Once | ORAL | Status: AC
  Administered 2019-02-02: 12:00:00 1000 mg via ORAL

## 2019-02-02 MED ORDER — FENTANYL CITRATE (PF) 100 MCG/2ML IJ SOLN: 25.0000 ug | INTRAMUSCULAR | Status: DC | PRN

## 2019-02-02 MED ORDER — PHENYLEPHRINE 40 MCG/ML (10ML) SYRINGE FOR IV PUSH (FOR BLOOD PRESSURE SUPPORT)
PREFILLED_SYRINGE | INTRAVENOUS | Status: AC
  Filled 2019-02-02: qty 10

## 2019-02-02 MED ORDER — ROCURONIUM BROMIDE 100 MG/10ML IV SOLN
INTRAVENOUS | Status: DC | PRN
  Administered 2019-02-02: 10 mg via INTRAVENOUS
  Administered 2019-02-02: 50 mg via INTRAVENOUS

## 2019-02-02 MED ORDER — PROPOFOL 10 MG/ML IV BOLUS
INTRAVENOUS | Status: DC | PRN
  Administered 2019-02-02: 140 mg via INTRAVENOUS

## 2019-02-02 MED ORDER — ONDANSETRON HCL 4 MG/2ML IJ SOLN
INTRAMUSCULAR | Status: DC | PRN
  Administered 2019-02-02: 4 mg via INTRAVENOUS

## 2019-02-02 MED ORDER — SODIUM CHLORIDE 0.9 % IV SOLN
INTRAVENOUS | Status: DC | PRN
  Administered 2019-02-02: 15:00:00 20 ug/min via INTRAVENOUS

## 2019-02-02 MED ORDER — PHENYLEPHRINE 40 MCG/ML (10ML) SYRINGE FOR IV PUSH (FOR BLOOD PRESSURE SUPPORT)
PREFILLED_SYRINGE | INTRAVENOUS | Status: DC | PRN
  Administered 2019-02-02: 40 ug via INTRAVENOUS
  Administered 2019-02-02: 80 ug via INTRAVENOUS
  Administered 2019-02-02: 120 ug via INTRAVENOUS
  Administered 2019-02-02 (×2): 80 ug via INTRAVENOUS
  Administered 2019-02-02: 40 ug via INTRAVENOUS

## 2019-02-02 MED ORDER — 0.9 % SODIUM CHLORIDE (POUR BTL) OPTIME
TOPICAL | Status: DC | PRN
  Administered 2019-02-02: 1000 mL

## 2019-02-02 MED ORDER — ACETAMINOPHEN 500 MG PO TABS
ORAL_TABLET | ORAL | Status: AC
  Administered 2019-02-02: 12:00:00 1000 mg via ORAL
  Filled 2019-02-02: qty 2

## 2019-02-02 MED ORDER — LIDOCAINE 2% (20 MG/ML) 5 ML SYRINGE
INTRAMUSCULAR | Status: DC | PRN
  Administered 2019-02-02: 80 mg via INTRAVENOUS

## 2019-02-02 MED ORDER — EPHEDRINE SULFATE-NACL 50-0.9 MG/10ML-% IV SOSY
PREFILLED_SYRINGE | INTRAVENOUS | Status: DC | PRN
  Administered 2019-02-02 (×2): 5 mg via INTRAVENOUS

## 2019-02-02 SURGICAL SUPPLY — 43 items
ADAPTER BRONCHOSCOPE OLYMPUS (ADAPTER) ×3 IMPLANT
ADAPTER VALVE BIOPSY EBUS (MISCELLANEOUS) IMPLANT
ADPTR VALVE BIOPSY EBUS (MISCELLANEOUS)
BRUSH CYTOL CELLEBRITY 1.5X140 (MISCELLANEOUS) ×3 IMPLANT
BRUSH SUPERTRAX BIOPSY (INSTRUMENTS) IMPLANT
BRUSH SUPERTRAX NDL-TIP CYTO (INSTRUMENTS) ×3 IMPLANT
CANISTER SUCT 3000ML PPV (MISCELLANEOUS) ×3 IMPLANT
CHANNEL WORK EXTEND EDGE 180 (KITS) IMPLANT
CHANNEL WORK EXTEND EDGE 90 (KITS) IMPLANT
CONT SPEC 4OZ CLIKSEAL STRL BL (MISCELLANEOUS) ×6 IMPLANT
COVER BACK TABLE 60X90IN (DRAPES) ×3 IMPLANT
COVER WAND RF STERILE (DRAPES) ×3 IMPLANT
FILTER STRAW FLUID ASPIR (MISCELLANEOUS) IMPLANT
FORCEPS BIOP SUPERTRX PREMAR (INSTRUMENTS) ×3 IMPLANT
GAUZE SPONGE 4X4 12PLY STRL (GAUZE/BANDAGES/DRESSINGS) ×3 IMPLANT
GLOVE SURG SS PI 7.5 STRL IVOR (GLOVE) ×3 IMPLANT
GOWN STRL REUS W/ TWL LRG LVL3 (GOWN DISPOSABLE) ×2 IMPLANT
GOWN STRL REUS W/TWL LRG LVL3 (GOWN DISPOSABLE) ×4
KIT CLEAN ENDO COMPLIANCE (KITS) ×3 IMPLANT
KIT LOCATABLE GUIDE (CANNULA) IMPLANT
KIT MARKER FIDUCIAL DELIVERY (KITS) ×3 IMPLANT
KIT PROCEDURE EDGE 180 (KITS) IMPLANT
KIT PROCEDURE EDGE 90 (KITS) ×3 IMPLANT
KIT TURNOVER KIT B (KITS) ×3 IMPLANT
MARKER FIDUCIAL SL NIT COIL (Implant Marker) ×9 IMPLANT
MARKER SKIN DUAL TIP RULER LAB (MISCELLANEOUS) ×3 IMPLANT
NEEDLE SUPERTRX PREMARK BIOPSY (NEEDLE) ×6 IMPLANT
NS IRRIG 1000ML POUR BTL (IV SOLUTION) ×3 IMPLANT
OIL SILICONE PENTAX (PARTS (SERVICE/REPAIRS)) ×3 IMPLANT
PAD ARMBOARD 7.5X6 YLW CONV (MISCELLANEOUS) ×6 IMPLANT
PATCHES PATIENT (LABEL) ×9 IMPLANT
SYR 20ML ECCENTRIC (SYRINGE) ×3 IMPLANT
SYR 20ML LL LF (SYRINGE) ×3 IMPLANT
SYR 50ML SLIP (SYRINGE) ×3 IMPLANT
TOWEL GREEN STERILE FF (TOWEL DISPOSABLE) ×3 IMPLANT
TRAP SPECIMEN MUCOUS 40CC (MISCELLANEOUS) IMPLANT
TUBE CONNECTING 20'X1/4 (TUBING) ×1
TUBE CONNECTING 20X1/4 (TUBING) ×2 IMPLANT
UNDERPAD 30X30 (UNDERPADS AND DIAPERS) ×3 IMPLANT
VALVE BIOPSY  SINGLE USE (MISCELLANEOUS) ×2
VALVE BIOPSY SINGLE USE (MISCELLANEOUS) ×1 IMPLANT
VALVE SUCTION BRONCHIO DISP (MISCELLANEOUS) ×3 IMPLANT
WATER STERILE IRR 1000ML POUR (IV SOLUTION) ×3 IMPLANT

## 2019-02-02 NOTE — Discharge Instructions (Signed)
Flexible Bronchoscopy, Care After This sheet gives you information about how to care for yourself after your test. Your doctor may also give you more specific instructions. If you have problems or questions, contact your doctor. Follow these instructions at home: Eating and drinking.  The day after the test, go back to your normal diet. Driving  Do not drive for 24 hours if you were given a medicine to help you relax (sedative).  Do not drive or use heavy machinery while taking prescription pain medicine. General instructions   Take over-the-counter and prescription medicines only as told by your doctor.  Return to your normal activities as told. Ask what activities are safe for you.  Do not use any products that have nicotine or tobacco in them. This includes cigarettes and e-cigarettes. If you need help quitting, ask your doctor.  Keep all follow-up visits as told by your doctor. This is important. It is very important if you had a tissue sample (biopsy) taken. Get help right away if:  You have shortness of breath that gets worse.  You get light-headed.  You feel like you are going to pass out (faint).  You have chest pain.  You cough up: ? More than a little blood. ? More blood than before. Summary  Do not eat or drink anything (not even water) for 2 hours after your test, or until your numbing medicine wears off.  Do not use cigarettes. Do not use e-cigarettes.  Get help right away if you have chest pain. This information is not intended to replace advice given to you by your health care provider. Make sure you discuss any questions you have with your health care provider. Document Released: 02/16/2009 Document Revised: 04/03/2017 Document Reviewed: 05/09/2016 Elsevier Patient Education  2020 [employer].

## 2019-02-02 NOTE — Anesthesia Procedure Notes (Signed)
Procedure Name: Intubation Date/Time: 02/02/2019 2:15 PM Performed by: Janene Harvey, CRNA Pre-anesthesia Checklist: Patient identified, Emergency Drugs available, Suction available and Patient being monitored Patient Re-evaluated:Patient Re-evaluated prior to induction Oxygen Delivery Method: Circle system utilized Preoxygenation: Pre-oxygenation with 100% oxygen Induction Type: IV induction Ventilation: Mask ventilation without difficulty and Oral airway inserted - appropriate to patient size Laryngoscope Size: Mac and 4 Grade View: Grade I Tube type: Oral Tube size: 8.5 mm Number of attempts: 1 Airway Equipment and Method: Stylet and Oral airway Placement Confirmation: ETT inserted through vocal cords under direct vision,  positive ETCO2 and breath sounds checked- equal and bilateral Secured at: 23 cm Tube secured with: Tape Dental Injury: Teeth and Oropharynx as per pre-operative assessment

## 2019-02-02 NOTE — Anesthesia Postprocedure Evaluation (Signed)
Anesthesia Post Note  Patient: Benjamin Willis.  Procedure(s) Performed: VIDEO BRONCHOSCOPY WITH NAVIGATION AND FIDUCIAL PLACEMENT (Right ) Video Bronchoscopy With Radial Endobronchial Ultrasound (Right ) Placement Of Fuducial Right Upper Lobe (Right )     Patient location during evaluation: PACU Anesthesia Type: General Level of consciousness: awake and alert Pain management: pain level controlled Vital Signs Assessment: post-procedure vital signs reviewed and stable Respiratory status: spontaneous breathing, nonlabored ventilation, respiratory function stable and patient connected to nasal cannula oxygen Cardiovascular status: blood pressure returned to baseline and stable Postop Assessment: no apparent nausea or vomiting Anesthetic complications: no    Last Vitals:  Vitals:   02/02/19 1629 02/02/19 1631  BP:  138/74  Pulse: 73 70  Resp: 15 11  Temp: 36.5 C   SpO2: 96% 96%    Last Pain:  Vitals:   02/02/19 1631  TempSrc:   PainSc: 0-No pain                 Catalina Gravel

## 2019-02-02 NOTE — Interval H&P Note (Signed)
History and Physical Interval Note:  02/02/2019 12:24 PM  Benjamin Willis.  has presented today for surgery, with the diagnosis of right lung nodule.  The various methods of treatment have been discussed with the patient and family. After consideration of risks, benefits and other options for treatment, the patient has consented to  Procedure(s): VIDEO BRONCHOSCOPY WITH NAVIGATION AND FIDUCIAL PLACEMENT (Right) as a surgical intervention.  The patient's history has been reviewed, patient examined, no change in status, stable for surgery.  I have reviewed the patient's chart and labs.  Questions were answered to the patient's satisfaction.    Patient seen in pre-op. All questions answered. Discussed risks of bleeding as well as pneumothorax. All questions answered. No barriers to proceed.   [place]

## 2019-02-02 NOTE — OR Nursing (Signed)
2 brushing samples were obtained and sent with [employer] rep for research study.

## 2019-02-02 NOTE — Op Note (Signed)
Video Bronchoscopy with Electromagnetic Navigation and fiducial placement procedure Note  Date of Operation: 02/02/2019  Pre-op Diagnosis: Right upper lobe lung nodule  Post-op Diagnosis: Right upper lobe lung nodule  Surgeon: Garner Nash, DO   Assistants: None   Anesthesia: General endotracheal anesthesia  Operation: Flexible video fiberoptic bronchoscopy with electromagnetic navigation and biopsies and 3 gold fiducial markers.  Estimated Blood Loss: Minimal  Complications: none   Indications and History: Benjamin Willis. is a 73 y.o. male with PET avid right upper lobe lung nodule.  The risks, benefits, complications, treatment options and expected outcomes were discussed with the patient.  The possibilities of pneumothorax, pneumonia, reaction to medication, pulmonary aspiration, perforation of a viscus, bleeding, failure to diagnose a condition and creating a complication requiring transfusion or operation were discussed with the patient who freely signed the consent.    Description of Procedure: The patient was seen in the Preoperative Area, was examined and was deemed appropriate to proceed.  The patient was taken to the OR 10, identified as Benjamin Willis. and the procedure verified as Flexible Video Fiberoptic Bronchoscopy.  A Time Out was held and the above information confirmed.   Prior to the date of the procedure a high-resolution CT scan of the chest was performed. Utilizing [place] a virtual tracheobronchial tree was generated to allow the creation of distinct navigation pathways to the patient's parenchymal abnormalities. After being taken to the operating room general anesthesia was initiated and the patient  was orally intubated. The video fiberoptic bronchoscope was introduced via the endotracheal tube and a general inspection was performed which showed normal right and left lung anatomy with evidence of airway pitting. The extendable working channel  and locator guide were introduced into the bronchoscope. The distinct navigation pathways prepared prior to this procedure were then utilized to navigate to within 0.5 cm of patient's lesion(s) identified on CT scan. The extendable working channel was secured into place and the locator guide was withdrawn.  The Olympus radial endobronchial ultrasound placed through the navigational catheter sheath and direct visualization under real-time ultrasound and peripheral targeting was completed. Under fluoroscopic guidance transbronchial needle brushings, transbronchial needle aspiration biopsies, and transbronchial forceps biopsies were performed to be sent for cytology and pathology. A bronchioalveolar lavage was performed in the right upper lobe and sent for cytology. At the conclusion, we used the navigational catheter to direct 3 locations with an 3 cm of the primary lesion within 3 separate axes to place fiducial marker. Based fiducial markers were placed under fluoroscopic guidance.  At the end of the procedure a general airway inspection was performed and there was no evidence of active bleeding. The bronchoscope was removed.  The patient tolerated the procedure well. There was no significant blood loss and there were no obvious complications. A post-procedural chest x-ray is pending.  Samples: 1. Transbronchial needle brushings from the right upper lobe 2. Transbronchial Wang needle biopsies from right upper lobe 3. Transbronchial forceps biopsies from right upper lobe 4. Bronchoalveolar lavage from right upper lobe  Plans:  The patient will be discharged from the PACU to home when recovered from anesthesia and after chest x-ray is reviewed. We will review the cytology, pathology and microbiology results with the patient when they become available. Outpatient followup will be with Dr. Valeta Harms.  I will place a referral to radiation oncology for SBRT.     Garner Nash, DO [facility] 02/02/2019 3:36 PM

## 2019-02-02 NOTE — Transfer of Care (Signed)
Immediate Anesthesia Transfer of Care Note  Patient: Benjamin Willis.  Procedure(s) Performed: VIDEO BRONCHOSCOPY WITH NAVIGATION AND FIDUCIAL PLACEMENT (Right ) Video Bronchoscopy With Radial Endobronchial Ultrasound (Right ) Placement Of Fuducial Right Upper Lobe (Right )  Patient Location: PACU  Anesthesia Type:General  Level of Consciousness: drowsy  Airway & Oxygen Therapy: Patient Spontanous Breathing and Patient connected to face mask oxygen  Post-op Assessment: Report given to RN and Post -op Vital signs reviewed and stable  Post vital signs: Reviewed  Last Vitals:  Vitals Value Taken Time  BP 138/69 02/02/19 1539  Temp    Pulse 73 02/02/19 1542  Resp 16 02/02/19 1542  SpO2 100 % 02/02/19 1542  Vitals shown include unvalidated device data.  Last Pain:  Vitals:   02/02/19 1213  TempSrc:   PainSc: 0-No pain      Patients Stated Pain Goal: 3 (35/57/32 2025)  Complications: No apparent anesthesia complications

## 2019-02-02 NOTE — Anesthesia Preprocedure Evaluation (Addendum)
Anesthesia Evaluation  Patient identified by MRN, date of birth, ID band Patient awake    Reviewed: Allergy & Precautions, NPO status , Patient's Chart, lab work & pertinent test results  Airway Mallampati: II  TM Distance: >3 FB Neck ROM: Full    Dental  (+) Dental Advisory Given, Lower Dentures, Upper Dentures   Pulmonary former smoker,  Right lung nodule    Pulmonary exam normal breath sounds clear to auscultation       Cardiovascular hypertension, Pt. on medications (-) angina(-) CAD, (-) Past MI and (-) Cardiac Stents Normal cardiovascular exam Rhythm:Regular Rate:Normal     Neuro/Psych negative neurological ROS     GI/Hepatic negative GI ROS, Neg liver ROS,   Endo/Other  diabetes, Type 2, Oral Hypoglycemic Agents  Renal/GU negative Renal ROS     Musculoskeletal negative musculoskeletal ROS (+)   Abdominal   Peds  Hematology  (+) Blood dyscrasia, anemia ,   Anesthesia Other Findings Day of surgery medications reviewed with the patient.  Reproductive/Obstetrics                            Anesthesia Physical Anesthesia Plan  ASA: III  Anesthesia Plan: General   Post-op Pain Management:    Induction: Intravenous  PONV Risk Score and Plan: 3 and Dexamethasone and Ondansetron  Airway Management Planned: Oral ETT  Additional Equipment:   Intra-op Plan:   Post-operative Plan: Extubation in OR  Informed Consent: I have reviewed the patients History and Physical, chart, labs and discussed the procedure including the risks, benefits and alternatives for the proposed anesthesia with the patient or authorized representative who has indicated his/her understanding and acceptance.     Dental advisory given  Plan Discussed with: CRNA  Anesthesia Plan Comments:         Anesthesia Quick Evaluation

## 2019-02-03 ENCOUNTER — Encounter (HOSPITAL_COMMUNITY): Payer: Self-pay | Admitting: Pulmonary Disease

## 2019-02-04 LAB — CYTOLOGY - NON PAP

## 2019-02-04 LAB — SURGICAL PATHOLOGY

## 2019-02-04 NOTE — Progress Notes (Signed)
Thoracic Location of Tumor / Histology:PET avid right upper lobe lung nodule   Patient presented with symptoms of: Benjamin Willis is a 73 year old former smoker with a history of type 2 diabetes hypertension and hyperlipidemia.  He started the low-dose screening program in 2019.  On his initial screen he was found to have a 6 mm right upper lobe lung nodule.  A repeat scan a year later showed that nodule increased in size to 13 x 10 x 8 mm.  There was no mediastinal or hilar adenopathy.  On PET CT the nodule was hypermetabolic consistent with a primary bronchogenic carcinoma.  Infectious and inflammatory nodules are also in the differential but are far less likely given his age, smoking history, and appearance of the nodule.  There is a 2R node high in the right paratracheal region that has some activity, but is normal in size.  I suspect this is reactive.  It would be a very unusual distribution to have just one isolated node in that location.  Biopsies revealed: 02/02/19: CYTOLOGY - NON PAP  CASE: [id number]  PATIENT: Benjamin Willis  Non-Gynecological Cytology Report    Clinical History: Right Upper Lobe Nodule   DIAGNOSIS:   A. LUNG, RUL NAVIGATION, BRUSHINGS:  - Malignant cells consistent with adenocarcinoma   B. LUNG, RUL NAVIGATION, NEEDLE ASPIRATION:  - Malignant cells consistent with adenocarcinoma   SURGICAL PATHOLOGY  CASE: [id number]  PATIENT: [name]  Surgical Pathology Report   Clinical History: Right lung nodule (cm)   DIAGNOSIS:   A. LUNG, RIGHT UPPER LOBE, BIOPSY:  - Scant tissue with minute focus of atypical cells    Tobacco/Marijuana/Snuff/ETOH use:  Tobacco Use  . Smoking status: Former Smoker    Packs/day: 1.00    Years: 48.00    Pack years: 48.00    Quit date: 02/20/2015    Years since quitting: 3.9  . Smokeless tobacco: Former Systems developer    Quit date: 10/2013  Substance and Sexual Activity  . Alcohol use: No    Frequency: Never  .  Drug use: No     Past/Anticipated interventions by cardiothoracic surgery, if any: Patient was seen by cardiothoracic surgery August 24, 2018 Dr. Roxan Hockey recommended VATS wedge resection and possible upper lobectomy.  Patient was scheduled for Wednesday, 09/08/2018.  Patient called and canceled his surgical procedure because he decided he did not want "anyone to come them".  Unfortunately he was failed to follow-up during COVID closures and rescheduling's.   Past/Anticipated interventions by medical oncology, if any: None at this time.  Signs/Symptoms Weight changes, if any:  Wt Readings from Last 3 Encounters:  02/07/19 156 lb 9.6 oz (71 kg)  01/19/19 162 lb 3.2 oz (73.6 kg)  11/24/18 166 lb 6.4 oz (75.5 kg)       Respiratory complaints, if any: At baseline patient has some shortness of breath with significant exertion however otherwise has been doing well.  Every morning he does have a productive cough of cloudy white sputum.  Otherwise the patient is able to complete all activities of daily living.  Hemoptysis, if any: pt denies  Pain issues, if any:  Pt denies c/o pain.  SAFETY ISSUES:  Prior radiation? No  Pacemaker/ICD? No   Possible current pregnancy? N/A  Is the patient on methotrexate? No  Current Complaints / other details:  Pt presents today for initial consult with Dr. Sondra Come for Radiation Oncology.   BP (!) 162/69 (BP Location: Left Arm, Patient Position: Sitting)   Pulse 67  Temp 98 F (36.7 C) (Temporal)   Resp 16   Ht 5\' 7"  (1.702 m)   Wt 156 lb 9.6 oz (71 kg)   SpO2 100%   BMI 24.53 kg/m   Loma Sousa, RN BSN

## 2019-02-06 NOTE — Progress Notes (Signed)
Radiation Oncology         [phone] ________________________________  Initial Outpatient Consultation  Name: Benjamin Willis. MRN: [id number]  Date: 02/07/2019  DOB: [birth year]  EU:[id number], Ines Bloomer, MD  Garner Nash, DO   REFERRING PHYSICIAN: Garner Nash, DO  DIAGNOSIS: The encounter diagnosis was Primary cancer of right upper lobe of lung ([place]).  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 is a 73 y.o. male who is accompanied by no one due to COVID restrictions. He is a former smoker who quit in 2015 with a 48 pack-year history. The patient presented for low-dose lung cancer screening CT. His first scan occurred on 06/17/2017 and showed a 6 mm right upper lobe nodule, favored to be benign. He returned for annual surveillance on 06/22/2018, which this time showed the RUL nodule at 13 mm.  He was referred to Dr. Valeta Harms on 07/22/2018, at which time he reported new onset productive cough with white mucous. Prior to his appointment, he underwent PET scan on 07/20/2018, which revealed: hypermetabolic 13 mm right upper lobe pulmonary nodule; 6 mm high right paratracheal node shows low level hypermetabolism; nonspecific, low-level uptake in the AP window is at or just above blood pool levels; no evidence for unexpected or suspicious hypermetabolic disease in the neck, abdomen, or pelvis.  He was then referred to Dr. Roxan Hockey for surgical consideration on 08/24/2018. He was scheduled for thoracoscopy with wedge resection on 09/08/2018, but he cancelled the procedure. He was lost to follow up until 01/19/2019, likely due to the pandemic, when he returned to Dr. Valeta Harms. He underwent super D chest CT on 01/24/2019 in anticipation of bronchoscopy. This showed interval progression of the spiculated right upper lobe pulmonary nodule.  Bronchoscopy was performed on 02/02/2019 and  showed malignant cells consistent with adenocarcinoma, taken from RUL brushings and needle aspiration.  He has been kindly referred for consideration of radiation therapy in place of surgical intervention.   PREVIOUS RADIATION THERAPY: No  PAST MEDICAL HISTORY:  Past Medical History:  Diagnosis Date   Allergy    Diabetes mellitus without complication ([place])    Type II   Hyperlipidemia    Hypertension     PAST SURGICAL HISTORY: Past Surgical History:  Procedure Laterality Date   ELECTROMAGNETIC NAVIGATION BROCHOSCOPY Right 02/02/2019   Procedure: VIDEO BRONCHOSCOPY WITH NAVIGATION AND FIDUCIAL PLACEMENT;  Surgeon: Garner Nash, DO;  Location: [facility];  Service: Cardiopulmonary;  Laterality: Right;   FUDUCIAL PLACEMENT Right 02/02/2019   Procedure: Placement Of Fuducial Right Upper Lobe;  Surgeon: Garner Nash, DO;  Location: [facility];  Service: Cardiopulmonary;  Laterality: Right;   none     VIDEO BRONCHOSCOPY WITH RADIAL ENDOBRONCHIAL ULTRASOUND Right 02/02/2019   Procedure: Video Bronchoscopy With Radial Endobronchial Ultrasound;  Surgeon: Garner Nash, DO;  Location: [facility];  Service: Cardiopulmonary;  Laterality: Right;    FAMILY HISTORY:  Family History  Problem Relation Age of Onset   Lung cancer Neg Hx     SOCIAL HISTORY:  Social History   Tobacco Use   Smoking status: Former Smoker    Packs/day: 1.00    Years: 48.00    Pack years: 48.00    Quit date: 02/20/2015    Years since quitting: 3.9   Smokeless tobacco: Former Systems developer    Quit date: 10/2013  Substance Use Topics   Alcohol use: No  Frequency: Never   Drug use: No    ALLERGIES: No Known Allergies  MEDICATIONS:  Current Outpatient Medications  Medication Sig Dispense Refill   albuterol (VENTOLIN HFA) 108 (90 Base) MCG/ACT inhaler Inhale 2 puffs into the lungs every 6 (six) hours as needed for wheezing or shortness of breath. 18 g 6   atorvastatin (LIPITOR) 20 MG tablet Take 1 tablet  (20 mg total) by mouth daily. 90 tablet 3   cetirizine (ZYRTEC) 10 MG tablet Take 1 tablet (10 mg total) by mouth daily. 30 tablet 11   glipiZIDE (GLUCOTROL) 5 MG tablet Take 1 tablet (5 mg total) by mouth daily before breakfast. 60 tablet 3   lisinopril (ZESTRIL) 20 MG tablet Take 1 tablet by mouth once daily 90 tablet 0   metFORMIN (GLUCOPHAGE) 500 MG tablet Take 1 tablet by mouth once daily with breakfast 90 tablet 0   Multiple Vitamins-Minerals (MULTIVITAMIN MEN) TABS Take 1 tablet by mouth daily.     umeclidinium-vilanterol (ANORO ELLIPTA) 62.5-25 MCG/INH AEPB Inhale 1 puff into the lungs daily. 30 each 5   No current facility-administered medications for this encounter.     REVIEW OF SYSTEMS:  A 10+ POINT REVIEW OF SYSTEMS WAS OBTAINED including neurology, dermatology, psychiatry, cardiac, respiratory, lymph, extremities, GI, GU, musculoskeletal, constitutional, reproductive, HEENT.  He denies any pain within the chest area cough or hemoptysis.  Patient denies any headaches or visual problems.     PHYSICAL EXAM:  height is 5\' 7"  (1.702 m) and weight is 156 lb 9.6 oz (71 kg). His temporal temperature is 98 F (36.7 C). His blood pressure is 162/69 (abnormal) and his pulse is 67. His respiration is 16 and oxygen saturation is 100%.   General: Alert and oriented, in no acute distress HEENT: Head is normocephalic. Extraocular movements are intact. Oropharynx is clear. Neck: Neck is supple, no palpable cervical or supraclavicular lymphadenopathy. Heart: Regular in rate and rhythm with no murmurs, rubs, or gallops. Chest: Clear to auscultation bilaterally, with no rhonchi, wheezes, or rales. Abdomen: Soft, nontender, nondistended, with no rigidity or guarding. Extremities: No cyanosis or edema. Lymphatics: see Neck Exam Skin: No concerning lesions. Musculoskeletal: symmetric strength and muscle tone throughout. Neurologic: Cranial nerves II through XII are grossly intact. No obvious  focalities. Speech is fluent. Coordination is intact. Psychiatric: Judgment and insight are intact. Affect is appropriate. Patient reports his left leg is considerably shorter than his right and therefore walks with a significant limp.  ECOG = 1  0 - Asymptomatic (Fully active, able to carry on all predisease activities without restriction)  1 - Symptomatic but completely ambulatory (Restricted in physically strenuous activity but ambulatory and able to carry out work of a light or sedentary nature. For example, light housework, office work)  2 - Symptomatic, <50% in bed during the day (Ambulatory and capable of all self care but unable to carry out any work activities. Up and about more than 50% of waking hours)  3 - Symptomatic, >50% in bed, but not bedbound (Capable of only limited self-care, confined to bed or chair 50% or more of waking hours)  4 - Bedbound (Completely disabled. Cannot carry on any self-care. Totally confined to bed or chair)  5 - Death   Eustace Pen MM, Creech RH, Tormey DC, et al. [phone]). "Toxicity and response criteria of the [facility]". [place] Oncol. 5 (6): 649-55  LABORATORY DATA:  Lab Results  Component Value Date   WBC 8.1 01/31/2019  HGB 12.3 (L) 01/31/2019   HCT 38.2 (L) 01/31/2019   MCV 93.3 01/31/2019   PLT 392.0 01/31/2019   NEUTROABS 2.9 11/24/2018   Lab Results  Component Value Date   NA 139 01/31/2019   K 4.3 01/31/2019   CL 101 01/31/2019   CO2 30 01/31/2019   GLUCOSE 205 (H) 01/31/2019   CREATININE 0.86 01/31/2019   CALCIUM 9.7 01/31/2019      RADIOGRAPHY: Dg Chest Port 1 View  Result Date: 02/02/2019 CLINICAL DATA:  Status post bronchoscopy. EXAM: PORTABLE CHEST 1 VIEW COMPARISON:  CT chest 01/24/2019 FINDINGS: Normal heart size. Pulmonary vascular congestion noted. Right upper lobe pulmonary nodule measuring approximately 2 cm is again noted with new nearby biopsy clips. No pneumothorax. IMPRESSION: 1. No  pneumothorax status post bronchoscopy. 2. Unchanged right lung nodule. 3. Pulmonary vascular congestion, new. Electronically Signed   By: Kerby Moors M.D.   On: 02/02/2019 16:11   Ct Super D Chest Wo Contrast  Result Date: 01/24/2019 CLINICAL DATA:  CT planning for tissue sampling. Right upper lobe pulmonary nodule. EXAM: CT CHEST WITHOUT CONTRAST TECHNIQUE: Multidetector CT imaging of the chest was performed using thin slice collimation for electromagnetic bronchoscopy planning purposes, without intravenous contrast. COMPARISON:  PET-CT 07/20/2018 FINDINGS: Cardiovascular: Heart size normal. Trace pericardial effusion. Atherosclerotic calcification is noted in the wall of the thoracic aorta. Mediastinum/Nodes: No mediastinal lymphadenopathy. No evidence for gross hilar lymphadenopathy although assessment is limited by the lack of intravenous contrast on today's study. The esophagus has normal imaging features. There is no axillary lymphadenopathy. Lungs/Pleura: Right upper lobe spiculated pulmonary nodule with lobular contour is progressive in the interval measuring 1.8 x 1.2 cm today. Centrilobular emphsyema noted. Subsegmental atelectasis noted right middle lobe. No focal airspace consolidation. No pleural effusion. Upper Abdomen: Unremarkable. Musculoskeletal: No worrisome lytic or sclerotic osseous abnormality. IMPRESSION: 1. Interval progression of the spiculated right upper lobe pulmonary nodule seen to be hypermetabolic on previous PET-CT. Imaging features remain compatible with neoplasm. 2. Emphysema. 3. Thoracic aortic atherosclerosis. Aortic Atherosclerosis (ICD10-I70.0) and Emphysema (ICD10-J43.9). Electronically Signed   By: Misty Stanley M.D.   On: 01/24/2019 09:30   Dg C-arm Bronchoscopy  Result Date: 02/02/2019 C-ARM BRONCHOSCOPY: Fluoroscopy was utilized by the requesting physician.  No radiographic interpretation.      IMPRESSION: Stage IA2  (cT1b, N0) non-small cell lung cancer  (adenocarcinoma) presenting in the right upper lobe of the lung.  Patient will be an excellent candidate for stereotactic body radiation therapy (SBRT) directed at his solitary lesion in the right upper lobe.  Patient has decided not to pursue surgery for his malignancy.    Today, I talked to the patient  about the findings and work-up thus far.  We discussed the natural history of non-small cell lung cancer and general treatment, highlighting the role of radiotherapy in the management.  We discussed the available radiation techniques, and focused on the details of logistics and delivery.  We reviewed the anticipated acute and late sequelae associated with radiation in this setting.  The patient was encouraged to ask questions that I answered to the best of my ability.  A patient consent form was discussed and signed.  We retained a copy for our records.  The patient would like to proceed with radiation and will be scheduled for CT simulation.  PLAN: Patient will return next week for stereotactic body radiation therapy planning.  He will begin his treatments approximately 2 weeks from now.  Anticipate 3 treatments directed at his right  upper lobe lesion.    ------------------------------------------------  Blair Promise, PhD, MD  This document serves as a record of services personally performed by Gery Pray, MD. It was created on his behalf by Wilburn Mylar, a trained medical scribe. The creation of this record is based on the scribe's personal observations and the provider's statements to them. This document has been checked and approved by the attending provider.

## 2019-02-07 ENCOUNTER — Other Ambulatory Visit: Payer: Self-pay

## 2019-02-07 ENCOUNTER — Encounter: Payer: Self-pay | Admitting: Radiation Oncology

## 2019-02-07 ENCOUNTER — Ambulatory Visit
Admission: RE | Admit: 2019-02-07 | Discharge: 2019-02-07 | Disposition: A | Discharge status: Home / Self Care | Payer: Medicare Other | Source: Ambulatory Visit | Attending: Radiation Oncology | Admitting: Radiation Oncology

## 2019-02-07 DIAGNOSIS — Z79899 Other long term (current) drug therapy: Secondary | ICD-10-CM | POA: Insufficient documentation

## 2019-02-07 DIAGNOSIS — J439 Emphysema, unspecified: Secondary | ICD-10-CM | POA: Diagnosis not present

## 2019-02-07 DIAGNOSIS — C3411 Malignant neoplasm of upper lobe, right bronchus or lung: Secondary | ICD-10-CM | POA: Diagnosis present

## 2019-02-07 DIAGNOSIS — E119 Type 2 diabetes mellitus without complications: Secondary | ICD-10-CM | POA: Diagnosis not present

## 2019-02-07 DIAGNOSIS — Z87891 Personal history of nicotine dependence: Secondary | ICD-10-CM | POA: Insufficient documentation

## 2019-02-07 DIAGNOSIS — E785 Hyperlipidemia, unspecified: Secondary | ICD-10-CM | POA: Diagnosis not present

## 2019-02-07 DIAGNOSIS — I10 Essential (primary) hypertension: Secondary | ICD-10-CM | POA: Diagnosis not present

## 2019-02-07 DIAGNOSIS — I700 Atherosclerosis of aorta: Secondary | ICD-10-CM | POA: Insufficient documentation

## 2019-02-07 DIAGNOSIS — R911 Solitary pulmonary nodule: Secondary | ICD-10-CM

## 2019-02-07 NOTE — Patient Instructions (Signed)
Coronavirus (COVID-19) Are you at risk?  Are you at risk for the Coronavirus (COVID-19)?  To be considered HIGH RISK for Coronavirus (COVID-19), you have to meet the following criteria:  . Traveled to [place], [place], [place], [place] or [place]; or in the [place] to [place], [place], [place], or [place]; and have fever, cough, and shortness of breath within the last 2 weeks of travel OR . Been in close contact with a person diagnosed with COVID-19 within the last 2 weeks and have fever, cough, and shortness of breath . IF YOU DO NOT MEET THESE CRITERIA, YOU ARE CONSIDERED LOW RISK FOR COVID-19.  What to do if you are HIGH RISK for COVID-19?  . If you are having a medical emergency, call 911. . Seek medical care right away. Before you go to a doctor's office, urgent care or emergency department, call ahead and tell them about your recent travel, contact with someone diagnosed with COVID-19, and your symptoms. You should receive instructions from your physician's office regarding next steps of care.  . When you arrive at healthcare provider, tell the healthcare staff immediately you have returned from visiting [place], [place], [place], [place] or [place]; or traveled in the [place] to [place], [place], [place], or [place]; in the last two weeks or you have been in close contact with a person diagnosed with COVID-19 in the last 2 weeks.   . Tell the health care staff about your symptoms: fever, cough and shortness of breath. . After you have been seen by a medical provider, you will be either: o Tested for (COVID-19) and discharged home on quarantine except to seek medical care if symptoms worsen, and asked to  - Stay home and avoid contact with others until you get your results (4-5 days)  - Avoid travel on public transportation if possible (such as bus, train, or airplane) or o Sent to the Emergency Department by EMS for evaluation, COVID-19 testing, and possible  admission depending on your condition and test results.  What to do if you are LOW RISK for COVID-19?  Reduce your risk of any infection by using the same precautions used for avoiding the common cold or flu:  . Wash your hands often with soap and warm water for at least 20 seconds.  If soap and water are not readily available, use an alcohol-based hand sanitizer with at least 60% alcohol.  . If coughing or sneezing, cover your mouth and nose by coughing or sneezing into the elbow areas of your shirt or coat, into a tissue or into your sleeve (not your hands). . Avoid shaking hands with others and consider head nods or verbal greetings only. . Avoid touching your eyes, nose, or mouth with unwashed hands.  . Avoid close contact with people who are sick. . Avoid places or events with large numbers of people in one location, like concerts or sporting events. . Carefully consider travel plans you have or are making. . If you are planning any travel outside or inside the [place], visit the CDC's Travelers' Health webpage for the latest health notices. . If you have some symptoms but not all symptoms, continue to monitor at home and seek medical attention if your symptoms worsen. . If you are having a medical emergency, call 911.   ADDITIONAL HEALTHCARE OPTIONS FOR PATIENTS  [facility] / e-Visit: [email or web address]         [facility]: [phone]  [facility]: [phone]                   [facility]: [phone]   

## 2019-02-10 ENCOUNTER — Other Ambulatory Visit: Payer: Self-pay | Admitting: *Deleted

## 2019-02-10 NOTE — Progress Notes (Signed)
The proposed treatment discussed in cancer conference 02/10/19 is for discussion purpose only and not a binding recommendation.  The patient was not physically examined nor present for their treatment options.  Therefore, final treatment plans cannot be decided.

## 2019-02-14 ENCOUNTER — Ambulatory Visit
Admission: RE | Admit: 2019-02-14 | Discharge: 2019-02-14 | Disposition: A | Discharge status: Home / Self Care | Payer: Medicare Other | Source: Ambulatory Visit | Attending: Radiation Oncology | Admitting: Radiation Oncology

## 2019-02-14 ENCOUNTER — Other Ambulatory Visit: Payer: Self-pay

## 2019-02-14 DIAGNOSIS — C3411 Malignant neoplasm of upper lobe, right bronchus or lung: Secondary | ICD-10-CM | POA: Insufficient documentation

## 2019-02-14 NOTE — Progress Notes (Signed)
  Radiation Oncology         [phone] ________________________________  Name: Benjamin Willis. MRN: [id number]  Date: 02/14/2019  DOB: [birth year]   STEREOTACTIC BODY RADIOTHERAPY SIMULATION AND TREATMENT PLANNING NOTE    DIAGNOSIS:  Stage IA2  (cT1b, N0) non-small cell lung cancer (adenocarcinoma) presenting in the right upper lobe of the lung  NARRATIVE:  The patient was brought to the [facility].  Identity was confirmed.  All relevant records and images related to the planned course of therapy were reviewed.  The patient freely provided informed written consent to proceed with treatment after reviewing the details related to the planned course of therapy. The consent form was witnessed and verified by the simulation staff.  Then, the patient was set-up in a stable reproducible  supine position for radiation therapy.  A BodyFix immobilization pillow was fabricated for reproducible positioning.  Then I personally applied the abdominal compression paddle to limit respiratory excursion.  4D respiratoy motion management CT images were obtained.  Surface markings were placed.  The CT images were loaded into the planning software.  Then, using Cine, MIP, and standard views, the internal target volume (ITV) and planning target volumes (PTV) were delinieated, and avoidance structures were contoured.  Treatment planning then occurred.  The radiation prescription was entered and confirmed.  A total of two complex treatment devices were fabricated in the form of the BodyFix immobilization pillow and a neck accuform cushion.  I have requested : 3D Simulation  I have requested a DVH of the following structures: Heart, Lungs, Esophagus, Chest Wall, Brachial Plexus, Major Blood Vessels, and targets.  PLAN:  The patient will receive 54 Gy in 3 fractions.  -----------------------------------  Blair Promise, PhD, MD  This document serves as a record of services personally performed by  Gery Pray, MD. It was created on his behalf by Wilburn Mylar, a trained medical scribe. The creation of this record is based on the scribe's personal observations and the provider's statements to them. This document has been checked and approved by the attending provider.

## 2019-02-17 DIAGNOSIS — C3411 Malignant neoplasm of upper lobe, right bronchus or lung: Secondary | ICD-10-CM | POA: Diagnosis not present

## 2019-02-21 ENCOUNTER — Ambulatory Visit
Admission: RE | Admit: 2019-02-21 | Discharge: 2019-02-21 | Disposition: A | Discharge status: Home / Self Care | Payer: Medicare Other | Source: Ambulatory Visit | Attending: Radiation Oncology | Admitting: Radiation Oncology

## 2019-02-21 ENCOUNTER — Other Ambulatory Visit: Payer: Self-pay

## 2019-02-21 DIAGNOSIS — C3411 Malignant neoplasm of upper lobe, right bronchus or lung: Secondary | ICD-10-CM | POA: Diagnosis not present

## 2019-02-21 NOTE — Progress Notes (Signed)
  Radiation Oncology         [phone]) [phone] ________________________________  Name: Benjamin Willis. MRN: [id number]  Date: 02/21/2019  DOB: 15-Feb-1946  Stereotactic Body Radiotherapy Treatment Procedure Note  NARRATIVE:  Benjamin Willis. was brought to the stereotactic radiation treatment machine and placed supine on the CT couch. The patient was set up for stereotactic body radiotherapy on the body fix pillow.  3D TREATMENT PLANNING AND DOSIMETRY:  The patient's radiation plan was reviewed and approved prior to starting treatment.  It showed 3-dimensional radiation distributions overlaid onto the planning CT.  The [facility] for the target structures as well as the organs at risk were reviewed. The documentation of this is filed in the radiation oncology EMR.  SIMULATION VERIFICATION:  The patient underwent CT imaging on the treatment unit.  These were carefully aligned to document that the ablative radiation dose would cover the target volume and maximally spare the nearby organs at risk according to the planned distribution.  SPECIAL TREATMENT PROCEDURE: Benjamin Willis. received high dose ablative stereotactic body radiotherapy to the planned target volume without unforeseen complications. Treatment was delivered uneventfully. The high doses associated with stereotactic body radiotherapy and the significant potential risks require careful treatment set up and patient monitoring constituting a special treatment procedure   STEREOTACTIC TREATMENT MANAGEMENT:  Following delivery, the patient was evaluated clinically. The patient tolerated treatment without significant acute effects, and was discharged to home in stable condition.    PLAN: Continue treatment as planned.  ________________________________  Blair Promise, PhD, MD

## 2019-02-22 ENCOUNTER — Ambulatory Visit: Payer: Medicare Other | Admitting: Radiation Oncology

## 2019-02-23 ENCOUNTER — Other Ambulatory Visit: Payer: Self-pay

## 2019-02-23 ENCOUNTER — Ambulatory Visit
Admission: RE | Admit: 2019-02-23 | Discharge: 2019-02-23 | Disposition: A | Discharge status: Home / Self Care | Payer: Medicare Other | Source: Ambulatory Visit | Attending: Radiation Oncology | Admitting: Radiation Oncology

## 2019-02-23 DIAGNOSIS — C3411 Malignant neoplasm of upper lobe, right bronchus or lung: Secondary | ICD-10-CM

## 2019-02-23 NOTE — Progress Notes (Signed)
  Radiation Oncology         [phone]) [phone] ________________________________  Name: Benjamin Willis. MRN: [id number]  Date: 02/23/2019  DOB: January 09, 1946  Stereotactic Body Radiotherapy Treatment Procedure Note  NARRATIVE:  Wilson Dusenbery. was brought to the stereotactic radiation treatment machine and placed supine on the CT couch. The patient was set up for stereotactic body radiotherapy on the body fix pillow.  3D TREATMENT PLANNING AND DOSIMETRY:  The patient's radiation plan was reviewed and approved prior to starting treatment.  It showed 3-dimensional radiation distributions overlaid onto the planning CT.  The [facility] for the target structures as well as the organs at risk were reviewed. The documentation of this is filed in the radiation oncology EMR.  SIMULATION VERIFICATION:  The patient underwent CT imaging on the treatment unit.  These were carefully aligned to document that the ablative radiation dose would cover the target volume and maximally spare the nearby organs at risk according to the planned distribution.  SPECIAL TREATMENT PROCEDURE: Benjamin Willis. received high dose ablative stereotactic body radiotherapy to the planned target volume without unforeseen complications. Treatment was delivered uneventfully. The high doses associated with stereotactic body radiotherapy and the significant potential risks require careful treatment set up and patient monitoring constituting a special treatment procedure   STEREOTACTIC TREATMENT MANAGEMENT:  Following delivery, the patient was evaluated clinically. The patient tolerated treatment without significant acute effects, and was discharged to home in stable condition.    PLAN: Continue treatment as planned.  ________________________________  Blair Promise, PhD, MD

## 2019-02-24 ENCOUNTER — Ambulatory Visit: Payer: Medicare Other | Admitting: Radiation Oncology

## 2019-02-25 ENCOUNTER — Ambulatory Visit: Payer: Medicare Other | Admitting: Radiation Oncology

## 2019-02-28 ENCOUNTER — Ambulatory Visit
Admission: RE | Admit: 2019-02-28 | Discharge: 2019-02-28 | Disposition: A | Discharge status: Home / Self Care | Payer: Medicare Other | Source: Ambulatory Visit | Attending: Radiation Oncology | Admitting: Radiation Oncology

## 2019-02-28 ENCOUNTER — Other Ambulatory Visit: Payer: Self-pay

## 2019-02-28 ENCOUNTER — Encounter: Payer: Self-pay | Admitting: Radiation Oncology

## 2019-02-28 DIAGNOSIS — C3411 Malignant neoplasm of upper lobe, right bronchus or lung: Secondary | ICD-10-CM | POA: Diagnosis not present

## 2019-02-28 NOTE — Progress Notes (Signed)
  Radiation Oncology         [phone]) [phone] ________________________________  Name: Benjamin Willis. MRN: [id number]  Date: 02/28/2019  DOB: 01-Apr-1946  Stereotactic Body Radiotherapy Treatment Procedure Note  NARRATIVE:  Benjamin Willis. was brought to the stereotactic radiation treatment machine and placed supine on the CT couch. The patient was set up for stereotactic body radiotherapy on the body fix pillow.  3D TREATMENT PLANNING AND DOSIMETRY:  The patient's radiation plan was reviewed and approved prior to starting treatment.  It showed 3-dimensional radiation distributions overlaid onto the planning CT.  The [facility] for the target structures as well as the organs at risk were reviewed. The documentation of this is filed in the radiation oncology EMR.  SIMULATION VERIFICATION:  The patient underwent CT imaging on the treatment unit.  These were carefully aligned to document that the ablative radiation dose would cover the target volume and maximally spare the nearby organs at risk according to the planned distribution.  SPECIAL TREATMENT PROCEDURE: Benjamin Willis. received high dose ablative stereotactic body radiotherapy to the planned target volume without unforeseen complications. Treatment was delivered uneventfully. The high doses associated with stereotactic body radiotherapy and the significant potential risks require careful treatment set up and patient monitoring constituting a special treatment procedure   STEREOTACTIC TREATMENT MANAGEMENT:  Following delivery, the patient was evaluated clinically. The patient tolerated treatment without significant acute effects, and was discharged to home in stable condition.    PLAN: Continue treatment as planned.  ________________________________  Blair Promise, PhD, MD

## 2019-03-01 ENCOUNTER — Ambulatory Visit: Payer: Medicare Other | Admitting: Radiation Oncology

## 2019-03-03 ENCOUNTER — Ambulatory Visit (INDEPENDENT_AMBULATORY_CARE_PROVIDER_SITE_OTHER): Payer: Medicare Other | Admitting: Gastroenterology

## 2019-03-03 ENCOUNTER — Ambulatory Visit (INDEPENDENT_AMBULATORY_CARE_PROVIDER_SITE_OTHER): Payer: Medicare Other | Admitting: Pulmonary Disease

## 2019-03-03 ENCOUNTER — Encounter: Payer: Self-pay | Admitting: Gastroenterology

## 2019-03-03 ENCOUNTER — Encounter: Payer: Self-pay | Admitting: Pulmonary Disease

## 2019-03-03 ENCOUNTER — Ambulatory Visit: Payer: Medicare Other | Admitting: Radiation Oncology

## 2019-03-03 ENCOUNTER — Other Ambulatory Visit: Payer: Self-pay

## 2019-03-03 VITALS — BP 124/78 | HR 65 | Temp 97.8°F | Ht 66.5 in | Wt 163.8 lb

## 2019-03-03 VITALS — BP 140/82 | HR 73 | Temp 98.0°F | Ht 67.0 in | Wt 168.3 lb

## 2019-03-03 DIAGNOSIS — Z87891 Personal history of nicotine dependence: Secondary | ICD-10-CM | POA: Diagnosis not present

## 2019-03-03 DIAGNOSIS — R911 Solitary pulmonary nodule: Secondary | ICD-10-CM | POA: Diagnosis not present

## 2019-03-03 DIAGNOSIS — Z5320 Procedure and treatment not carried out because of patient's decision for unspecified reasons: Secondary | ICD-10-CM

## 2019-03-03 DIAGNOSIS — C3411 Malignant neoplasm of upper lobe, right bronchus or lung: Secondary | ICD-10-CM

## 2019-03-03 MED ORDER — ANORO ELLIPTA 62.5-25 MCG/INH IN AEPB: 1.0000 | INHALATION_SPRAY | Freq: Every day | RESPIRATORY_TRACT | 5 refills | Status: DC

## 2019-03-03 MED ORDER — ANORO ELLIPTA 62.5-25 MCG/INH IN AEPB: 1.0000 | INHALATION_SPRAY | Freq: Every day | RESPIRATORY_TRACT | 5 refills | Status: AC

## 2019-03-03 MED ORDER — ANORO ELLIPTA 62.5-25 MCG/INH IN AEPB: 1.0000 | INHALATION_SPRAY | Freq: Every day | RESPIRATORY_TRACT | 0 refills | Status: AC

## 2019-03-03 NOTE — Patient Instructions (Addendum)
Thank you for visiting Dr. Valeta Harms at [facility]. Today we recommend the following:  Use ANORO INHALER ONCE PER DAY  Use Albuterol inhalter as needed for shortness of breath    Return in about 6 months (around 09/01/2019).    Please do your part to reduce the spread of COVID-19.

## 2019-03-03 NOTE — Progress Notes (Addendum)
Referring Provider: Horald Pollen, * Primary Care Physician:  Horald Pollen, MD  Reason for Consultation:  Screening colonoscopy   IMPRESSION and PLAN:  No prior colon cancer screening No known family history of colon cancer or polyps  I would like to review his care with Dr. Mitchel Honour to determine the appropriateness of colon cancer screening given his recent diagnosis of lung cancer. If appropriate, will proceed with colonoscopy. I discussed the procedure with the patient at length today.    HPI: Benjamin Willis. is a 73 y.o. male referred for colon cancer screening. The history is obtained through the patient, review of his electronic health record, and the patient's sister (who accompanied him but was in the car waiting and provided history by phone). Referral placed in July 2020.  He has stage I A2 non-small cell lung cancer presenting in the right upper lobe of the lung diagnosed in September 2020, diabetes, and hypertension. He is currently getting radiation therapy. He declined chemotherapy or surgery and understands that he is doing well. He does not wear any oxygen.   He is unsure if he has had a colonoscopy before. He thinks he has, but remembers it being a shot.   There is no dysphagia, odynophagia, regurgitation,  heartburn, nausea, abdominal pain, change in bowel habits, melena, hematochezia, or bright red blood per rectum. There is no anorexia or recent change in weight.   No known family history of colon cancer or polyps. No family history of uterine/endometrial cancer, pancreatic cancer or gastric/stomach cancer.   Past Medical History:  Diagnosis Date  . Allergy   . Diabetes mellitus without complication ([place])    Type II  . Hyperlipidemia   . Hypertension     Past Surgical History:  Procedure Laterality Date  . ELECTROMAGNETIC NAVIGATION BROCHOSCOPY Right 02/02/2019   Procedure: VIDEO BRONCHOSCOPY WITH NAVIGATION AND FIDUCIAL PLACEMENT;  Surgeon:  Garner Nash, DO;  Location: [place];  Service: Cardiopulmonary;  Laterality: Right;  . FUDUCIAL PLACEMENT Right 02/02/2019   Procedure: Placement Of Fuducial Right Upper Lobe;  Surgeon: Garner Nash, DO;  Location: [place];  Service: Cardiopulmonary;  Laterality: Right;  . none    . VIDEO BRONCHOSCOPY WITH RADIAL ENDOBRONCHIAL ULTRASOUND Right 02/02/2019   Procedure: Video Bronchoscopy With Radial Endobronchial Ultrasound;  Surgeon: Garner Nash, DO;  Location: [place];  Service: Cardiopulmonary;  Laterality: Right;    Current Outpatient Medications  Medication Sig Dispense Refill  . albuterol (VENTOLIN HFA) 108 (90 Base) MCG/ACT inhaler Inhale 2 puffs into the lungs every 6 (six) hours as needed for wheezing or shortness of breath. 18 g 6  . atorvastatin (LIPITOR) 20 MG tablet Take 1 tablet (20 mg total) by mouth daily. 90 tablet 3  . cetirizine (ZYRTEC) 10 MG tablet Take 1 tablet (10 mg total) by mouth daily. 30 tablet 11  . lisinopril (ZESTRIL) 20 MG tablet Take 1 tablet by mouth once daily 90 tablet 0  . metFORMIN (GLUCOPHAGE) 500 MG tablet Take 1 tablet by mouth once daily with breakfast 90 tablet 0  . Multiple Vitamins-Minerals (MULTIVITAMIN MEN) TABS Take 1 tablet by mouth daily.    Marland Kitchen umeclidinium-vilanterol (ANORO ELLIPTA) 62.5-25 MCG/INH AEPB Inhale 1 puff into the lungs daily. 30 each 5   No current facility-administered medications for this visit.     Allergies as of 03/03/2019  . (No Known Allergies)    Family History  Problem Relation Age of Onset  . Lung cancer Neg Hx  Social History   Socioeconomic History  . Marital status: Single    Spouse name: Not on file  . Number of children: 0  . Years of education: Not on file  . Highest education level: 9th grade  Occupational History  . Not on file  Social Needs  . Financial resource strain: Not hard at all  . Food insecurity    Worry: Never true    Inability: Never true  . Transportation needs     Medical: No    Non-medical: No  Tobacco Use  . Smoking status: Former Smoker    Packs/day: 1.00    Years: 48.00    Pack years: 48.00    Quit date: 02/20/2015    Years since quitting: 4.0  . Smokeless tobacco: Former Systems developer    Quit date: 10/2013  Substance and Sexual Activity  . Alcohol use: No    Frequency: Never  . Drug use: No  . Sexual activity: Yes    Birth control/protection: Condom  Lifestyle  . Physical activity    Days per week: 0 days    Minutes per session: 0 min  . Stress: Not at all  Relationships  . Social connections    Talks on phone: More than three times a week    Gets together: More than three times a week    Attends religious service: More than 4 times per year    Active member of club or organization: No    Attends meetings of clubs or organizations: Never    Relationship status: Never married  . Intimate partner violence    Fear of current or ex partner: No    Emotionally abused: No    Physically abused: No    Forced sexual activity: No  Other Topics Concern  . Not on file  Social History Narrative  . Not on file    Review of Systems: 12 system ROS is negative except as noted above except he walks with a limp because his left leg is considerably shorter than his right leg.   Physical Exam: General:   Alert,  well-nourished, pleasant and cooperative in NAD Head:  Normocephalic and atraumatic. Eyes:  Sclera clear, no icterus.   Conjunctiva pink. Ears:  Normal auditory acuity. Nose:  No deformity, discharge,  or lesions. Mouth:  No deformity or lesions.   Neck:  Supple; no masses or thyromegaly. Lungs:  Clear throughout to auscultation.   No wheezes. Heart:  Regular rate and rhythm; no murmurs. Abdomen:  Soft,nontender, nondistended, normal bowel sounds, no rebound or guarding. No hepatosplenomegaly.   Rectal:  Deferred  Msk:  Symmetrical. No boney deformities LAD: No inguinal or umbilical LAD Extremities:  No clubbing or edema. Neurologic:   Alert and  oriented x4;  grossly nonfocal Skin:  Intact without significant lesions or rashes. Psych:  Alert and cooperative. Normal mood and affect.   Nicholson Starace L. Tarri Glenn, MD, MPH 03/03/2019, 8:56 AM

## 2019-03-03 NOTE — Progress Notes (Signed)
Synopsis: Referred in March 2020 for RUL lung nodule by Horald Pollen, *  Subjective:   PATIENT ID: Benjamin Willis. GENDER: male DOB: 1945-05-19, MRN: [id number]  Chief Complaint  Patient presents with  . Follow-up    F/u after Bronch.     This is a 74 year old gentleman seen today in follow-up after recent low-dose lung cancer screening CT which revealed a right upper lobe pulmonary nodule followed by PET scan which revealed PET avid uptake within the lung nodule as well as a slightly increased activity within a 6 mm paratracheal lymph node that was just above the mediastinal blood pool SUV.  At baseline patient has some shortness of breath with significant exertion however otherwise has been doing well.  Every morning he does have a productive cough of cloudy white sputum.  Otherwise the patient is able to complete all activities of daily living.  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  He did have recent pulmonary function test completed.  He did have difficulty with bleeding spirometry.  We will plan to repeat off spirometry today to have this done.  OV 01/19/2019: Patient last seen in our office for evaluation of left upper lobe PET avid pulmonary nodule.  Patient was seen by cardiothoracic surgery August 24, 2018 Dr. Roxan Hockey recommended VATS wedge resection and possible upper lobectomy.  Patient was scheduled for Wednesday, 09/08/2018.  Patient called and canceled his surgical procedure because he decided he did not want "anyone to come them".  Unfortunately he was failed to follow-up during COVID closures and rescheduling's.  Patient seen today for the first time since March.  Otherwise his breathing has been doing well.  Currently managed on Anoro Ellipta plus albuterol as needed.  OV 03/03/2019: Patient on 02/02/2019 underwent navigational bronchoscopy for the right upper lobe pulmonary nodule as described above.  Tissue sampling revealed adenocarcinoma.   Fiducials were placed and the patient has followed up since with radiation oncology.  He has underwent SBRT.  Patient doing well overall patient has no respiratory complaints today.  He has not been using his Anoro daily.  He has only been using it as needed.  He is been using his albuterol and carries that with him daily.  Patient was instructed on importance of using his Anoro Ellipta Ellipta daily.  He does state that he was breathing better when he was using it regularly and he is not really sure why he quit.   Past Medical History:  Diagnosis Date  . Allergy   . Diabetes mellitus without complication ([place])    Type II  . Hyperlipidemia   . Hypertension      Family History  Problem Relation Age of Onset  . Lung cancer Neg Hx      Past Surgical History:  Procedure Laterality Date  . ELECTROMAGNETIC NAVIGATION BROCHOSCOPY Right 02/02/2019   Procedure: VIDEO BRONCHOSCOPY WITH NAVIGATION AND FIDUCIAL PLACEMENT;  Surgeon: Garner Nash, DO;  Location: [place];  Service: Cardiopulmonary;  Laterality: Right;  . FUDUCIAL PLACEMENT Right 02/02/2019   Procedure: Placement Of Fuducial Right Upper Lobe;  Surgeon: Garner Nash, DO;  Location: [place];  Service: Cardiopulmonary;  Laterality: Right;  . none    . VIDEO BRONCHOSCOPY WITH RADIAL ENDOBRONCHIAL ULTRASOUND Right 02/02/2019   Procedure: Video Bronchoscopy With Radial Endobronchial Ultrasound;  Surgeon: Garner Nash, DO;  Location: [place];  Service: Cardiopulmonary;  Laterality: Right;    Social History   Socioeconomic History  .  Marital status: Single    Spouse name: Not on file  . Number of children: 0  . Years of education: Not on file  . Highest education level: 9th grade  Occupational History  . Not on file  Social Needs  . Financial resource strain: Not hard at all  . Food insecurity    Worry: Never true    Inability: Never true  . Transportation needs    Medical: No    Non-medical: No  Tobacco Use  . Smoking  status: Former Smoker    Packs/day: 1.00    Years: 48.00    Pack years: 48.00    Quit date: 02/20/2015    Years since quitting: 4.0  . Smokeless tobacco: Former Systems developer    Quit date: 10/2013  Substance and Sexual Activity  . Alcohol use: No    Frequency: Never  . Drug use: No  . Sexual activity: Yes    Birth control/protection: Condom  Lifestyle  . Physical activity    Days per week: 0 days    Minutes per session: 0 min  . Stress: Not at all  Relationships  . Social connections    Talks on phone: More than three times a week    Gets together: More than three times a week    Attends religious service: More than 4 times per year    Active member of club or organization: No    Attends meetings of clubs or organizations: Never    Relationship status: Never married  . Intimate partner violence    Fear of current or ex partner: No    Emotionally abused: No    Physically abused: No    Forced sexual activity: No  Other Topics Concern  . Not on file  Social History Narrative  . Not on file     No Known Allergies   Outpatient Medications Prior to Visit  Medication Sig Dispense Refill  . albuterol (VENTOLIN HFA) 108 (90 Base) MCG/ACT inhaler Inhale 2 puffs into the lungs every 6 (six) hours as needed for wheezing or shortness of breath. 18 g 6  . atorvastatin (LIPITOR) 20 MG tablet Take 1 tablet (20 mg total) by mouth daily. 90 tablet 3  . cetirizine (ZYRTEC) 10 MG tablet Take 1 tablet (10 mg total) by mouth daily. 30 tablet 11  . lisinopril (ZESTRIL) 20 MG tablet Take 1 tablet by mouth once daily 90 tablet 0  . metFORMIN (GLUCOPHAGE) 500 MG tablet Take 1 tablet by mouth once daily with breakfast 90 tablet 0  . Multiple Vitamins-Minerals (MULTIVITAMIN MEN) TABS Take 1 tablet by mouth daily.    Marland Kitchen umeclidinium-vilanterol (ANORO ELLIPTA) 62.5-25 MCG/INH AEPB Inhale 1 puff into the lungs daily. 30 each 5   No facility-administered medications prior to visit.     Review of Systems   Constitutional: Negative for chills, fever, malaise/fatigue and weight loss.  HENT: Negative for hearing loss, sore throat and tinnitus.   Eyes: Negative for blurred vision and double vision.  Respiratory: Negative for cough, hemoptysis, sputum production, shortness of breath, wheezing and stridor.   Cardiovascular: Negative for chest pain, palpitations, orthopnea, leg swelling and PND.  Gastrointestinal: Negative for abdominal pain, constipation, diarrhea, heartburn, nausea and vomiting.  Genitourinary: Negative for dysuria, hematuria and urgency.  Musculoskeletal: Negative for joint pain and myalgias.  Skin: Negative for itching and rash.  Neurological: Negative for dizziness, tingling, weakness and headaches.  Endo/Heme/Allergies: Negative for environmental allergies. Does not bruise/bleed easily.  Psychiatric/Behavioral: Negative for  depression. The patient is not nervous/anxious and does not have insomnia.   All other systems reviewed and are negative.    Objective:  Physical Exam Vitals signs reviewed.  Constitutional:      General: He is not in acute distress.    Appearance: He is well-developed.  HENT:     Head: Normocephalic and atraumatic.  Eyes:     General: No scleral icterus.    Conjunctiva/sclera: Conjunctivae normal.     Pupils: Pupils are equal, round, and reactive to light.  Neck:     Musculoskeletal: Neck supple.     Vascular: No JVD.     Trachea: No tracheal deviation.  Cardiovascular:     Rate and Rhythm: Normal rate and regular rhythm.     Heart sounds: Normal heart sounds. No murmur.  Pulmonary:     Effort: Pulmonary effort is normal. No tachypnea, accessory muscle usage or respiratory distress.     Breath sounds: No stridor. No wheezing, rhonchi or rales.  Abdominal:     General: Bowel sounds are normal. There is no distension.     Palpations: Abdomen is soft.     Tenderness: There is no abdominal tenderness.  Musculoskeletal:        General: No  tenderness.  Lymphadenopathy:     Cervical: No cervical adenopathy.  Skin:    General: Skin is warm and dry.     Capillary Refill: Capillary refill takes less than 2 seconds.     Findings: No rash.  Neurological:     Mental Status: He is alert and oriented to person, place, and time.  Psychiatric:        Behavior: Behavior normal.      Vitals:   03/03/19 0940  BP: 124/78  Pulse: 65  Temp: 97.8 F (36.6 C)  TempSrc: Temporal  SpO2: 98%  Weight: 163 lb 12.8 oz (74.3 kg)  Height: 5' 6.5" (1.689 m)   98% on RA BMI Readings from Last 3 Encounters:  03/03/19 26.04 kg/m  03/03/19 26.36 kg/m  02/07/19 24.53 kg/m   Wt Readings from Last 3 Encounters:  03/03/19 163 lb 12.8 oz (74.3 kg)  03/03/19 168 lb 4.8 oz (76.3 kg)  02/07/19 156 lb 9.6 oz (71 kg)     CBC    Component Value Date/Time   WBC 8.1 01/31/2019 1117   RBC 4.09 (L) 01/31/2019 1117   HGB 12.3 (L) 01/31/2019 1117   HGB 12.9 (L) 11/24/2018 0945   HCT 38.2 (L) 01/31/2019 1117   HCT 37.8 11/24/2018 0945   PLT 392.0 01/31/2019 1117   PLT 235 11/24/2018 0945   MCV 93.3 01/31/2019 1117   MCV 90 11/24/2018 0945   MCH 30.9 11/24/2018 0945   MCH 30.5 02/19/2017 1146   MCH 30.6 12/21/2014 1213   MCHC 32.2 01/31/2019 1117   RDW 12.7 01/31/2019 1117   RDW 11.8 11/24/2018 0945   LYMPHSABS 2.1 11/24/2018 0945   MONOABS 0.9 12/21/2014 1213   EOSABS 0.1 11/24/2018 0945   BASOSABS 0.0 11/24/2018 0945   Chest Imaging: 07/20/2018: PET SCAN  IMPRESSION: 1. 13 mm right upper lobe pulmonary nodule is hypermetabolic consistent with primary bronchogenic neoplasm. 2. 6 mm short axis high right paratracheal node is within normal limits for size but shows low level hypermetabolism. Although no hypermetabolic disease is evident in the right hilum, metastatic involvement of the right paratracheal node can not be excluded.  Pulmonary Functions Testing Results: PFT Results Latest Ref Rng & Units 07/13/2018  DLCO  UNC% % 87   DLCO COR %Predicted % 144  TLC L 4.47  TLC % Predicted % 69  RV % Predicted % 103    07/22/2018 office spirometry: Ratio 76, FEV1 98%, FEV1 2.4 L normal spirometry  FeNO: None   Pathology:   02/02/2019: DIAGNOSIS:  A. LUNG, RUL NAVIGATION, BRUSHINGS:  - Malignant cells consistent with adenocarcinoma  B. LUNG, RUL NAVIGATION, NEEDLE ASPIRATION:  - Malignant cells consistent with adenocarcinoma   Echocardiogram: None   Heart Catheterization: None     Assessment & Plan:   Primary adenocarcinoma of upper lobe of right lung (HCC)  Lung nodule  Nodule of upper lobe of right lung - Plan: umeclidinium-vilanterol (ANORO ELLIPTA) 62.5-25 MCG/INH AEPB, DISCONTINUED: umeclidinium-vilanterol (ANORO ELLIPTA) 62.5-25 MCG/INH AEPB  Former smoker - Plan: umeclidinium-vilanterol (ANORO ELLIPTA) 62.5-25 MCG/INH AEPB, DISCONTINUED: umeclidinium-vilanterol (ANORO ELLIPTA) 62.5-25 MCG/INH AEPB  Discussion:  73 year old gentleman with a recent diagnosis of T1 a adenocarcinoma of the lung status post navigational bronchoscopy fiducial placement and SBRT.  Overall patient doing well today.  Also likely has COPD currently managed on Anoro Ellipta breathing well.  Patient doing well post bronchoscopy and SBRT.  Plan:  Continue albuterol as needed for shortness of breath or wheezing Continue Anoro Ellipta for COPD management Follow-up in clinic in 6 months.  Or as needed. Cancer surveillance imaging for potential recurrence planned by radiation oncology.  Greater than 50% of this patient's 50-minute office was spent face-to-face discussing above recommendations and treatment plan.    Current Outpatient Medications:  .  albuterol (VENTOLIN HFA) 108 (90 Base) MCG/ACT inhaler, Inhale 2 puffs into the lungs every 6 (six) hours as needed for wheezing or shortness of breath., Disp: 18 g, Rfl: 6 .  atorvastatin (LIPITOR) 20 MG tablet, Take 1 tablet (20 mg total) by mouth daily., Disp: 90 tablet, Rfl: 3  .  cetirizine (ZYRTEC) 10 MG tablet, Take 1 tablet (10 mg total) by mouth daily., Disp: 30 tablet, Rfl: 11 .  lisinopril (ZESTRIL) 20 MG tablet, Take 1 tablet by mouth once daily, Disp: 90 tablet, Rfl: 0 .  metFORMIN (GLUCOPHAGE) 500 MG tablet, Take 1 tablet by mouth once daily with breakfast, Disp: 90 tablet, Rfl: 0 .  Multiple Vitamins-Minerals (MULTIVITAMIN MEN) TABS, Take 1 tablet by mouth daily., Disp: , Rfl:  .  umeclidinium-vilanterol (ANORO ELLIPTA) 62.5-25 MCG/INH AEPB, Inhale 1 puff into the lungs daily., Disp: 30 each, Rfl: [place], DO [facility] 03/03/2019 9:59 AM

## 2019-03-17 NOTE — Progress Notes (Incomplete)
°  Patient Name: Benjamin Willis MRN: [id number] DOB: 18-May-1945 Referring Physician: June Leap Date of Service: 02/28/2019 [facility], [place]                                                        End Of Treatment Note  Diagnoses: C34.11-Malignant neoplasm of upper lobe, right bronchus or lung  Cancer Staging: Stage IA2  (cT1b, N0) non-small cell lung cancer (adenocarcinoma)   Intent: Curative  Radiation Treatment Dates: 02/21/2019 through 02/28/2019 Site Technique Total Dose (Gy) Dose per Fx (Gy) Completed Fx Beam Energies  Thorax: Lung_Rt SBRT 54/54 18 3/3 6XFFF   Narrative: The patient tolerated radiation therapy relatively well. He reported feeling well and remaining active during treatment. He denied cough and hemoptysis.  Plan: The patient will follow-up with radiation oncology in 1 month.  ________________________________________________   Blair Promise, PhD, MD  This document serves as a record of services personally performed by Gery Pray, MD. It was created on his behalf by Wilburn Mylar, a trained medical scribe. The creation of this record is based on the scribe's personal observations and the provider's statements to them. This document has been checked and approved by the attending provider.

## 2019-04-04 ENCOUNTER — Other Ambulatory Visit: Payer: Self-pay

## 2019-04-04 ENCOUNTER — Ambulatory Visit
Admission: RE | Admit: 2019-04-04 | Discharge: 2019-04-04 | Disposition: A | Discharge status: Home / Self Care | Payer: Medicare Other | Source: Ambulatory Visit | Attending: Radiation Oncology | Admitting: Radiation Oncology

## 2019-04-04 ENCOUNTER — Encounter: Payer: Self-pay | Admitting: Radiation Oncology

## 2019-04-04 VITALS — BP 143/94 | HR 58 | Temp 98.9°F | Resp 20 | Wt 165.2 lb

## 2019-04-04 DIAGNOSIS — Z79899 Other long term (current) drug therapy: Secondary | ICD-10-CM | POA: Insufficient documentation

## 2019-04-04 DIAGNOSIS — Z923 Personal history of irradiation: Secondary | ICD-10-CM | POA: Diagnosis not present

## 2019-04-04 DIAGNOSIS — Z7984 Long term (current) use of oral hypoglycemic drugs: Secondary | ICD-10-CM | POA: Insufficient documentation

## 2019-04-04 DIAGNOSIS — C3411 Malignant neoplasm of upper lobe, right bronchus or lung: Secondary | ICD-10-CM | POA: Insufficient documentation

## 2019-04-04 NOTE — Progress Notes (Signed)
Patient is here for a follow-up appointment today he denies any pain or fatigue. Patient denies any  sob or difficulty with swallowing. Patient denies any issues with his appetite.   Patient denies any issues with his  skin.  Patient denies any coughing.  Vitals:   04/04/19 1203  BP: (!) 143/94  Pulse: (!) 58  Resp: 20  Temp: 98.9 F (37.2 C)  SpO2: 100%  Weight: 165 lb 3.2 oz (74.9 kg)

## 2019-04-04 NOTE — Progress Notes (Signed)
  Radiation Oncology         [id number]) [phone] ________________________________  Name: Benjamin Willis. MRN: [id number]  Date: 04/04/2019  DOB: [birth year]  Follow-Up Visit Note  CC: Horald Pollen, MD  Garner Nash, DO    ICD-10-CM   1. Primary cancer of right upper lobe of lung (HCC)  C34.11     Diagnosis: Stage IA2 (cT1b, N0)non-small cell lung cancer (adenocarcinoma)  Interval Since Last Radiation: One month.  adiation Treatment Dates: 02/21/2019 through 02/28/2019 Site Technique Total Dose (Gy) Dose per Fx (Gy) Completed Fx Beam Energies  Thorax: Lung_Rt SBRT 54/54 18 3/3 6XFFF    Narrative:  The patient returns today for routine follow-up. Since end of treatment, patient had followed up with Dr. Tarri Glenn, gastroenterologist, on 03/03/2019 to discuss colonoscopy. No date has been set. Patient also followed up with Dr. Valeta Harms, pulmonologist, on 03/03/2019. No remarkable findings at that time.    On review of systems, he reports no difficulties with breathing. He denies hemoptysis and all other symptoms.      ALLERGIES:  has No Known Allergies.  Meds: Current Outpatient Medications  Medication Sig Dispense Refill  . albuterol (VENTOLIN HFA) 108 (90 Base) MCG/ACT inhaler Inhale 2 puffs into the lungs every 6 (six) hours as needed for wheezing or shortness of breath. 18 g 6  . atorvastatin (LIPITOR) 20 MG tablet Take 1 tablet (20 mg total) by mouth daily. 90 tablet 3  . lisinopril (ZESTRIL) 20 MG tablet Take 1 tablet by mouth once daily 90 tablet 0  . metFORMIN (GLUCOPHAGE) 500 MG tablet Take 1 tablet by mouth once daily with breakfast 90 tablet 0  . Multiple Vitamins-Minerals (MULTIVITAMIN MEN) TABS Take 1 tablet by mouth daily.    Marland Kitchen umeclidinium-vilanterol (ANORO ELLIPTA) 62.5-25 MCG/INH AEPB Inhale 1 puff into the lungs daily. 30 each 5  . cetirizine (ZYRTEC) 10 MG tablet Take 1 tablet (10 mg total) by mouth daily. (Patient not taking: Reported on 04/04/2019) 30  tablet 11   No current facility-administered medications for this encounter.     Physical Findings: The patient is in no acute distress. Patient is alert and oriented.  weight is 165 lb 3.2 oz (74.9 kg). His temperature is 98.9 F (37.2 C). His blood pressure is 143/94 (abnormal) and his pulse is 58 (abnormal). His respiration is 20 and oxygen saturation is 100%. .  No significant changes. Lungs are clear to auscultation bilaterally. Heart has regular rate and rhythm. No palpable cervical, supraclavicular, or axillary adenopathy. Abdomen soft, non-tender, normal bowel sounds.  Lab Findings: Lab Results  Component Value Date   WBC 8.1 01/31/2019   HGB 12.3 (L) 01/31/2019   HCT 38.2 (L) 01/31/2019   MCV 93.3 01/31/2019   PLT 392.0 01/31/2019    Radiographic Findings: No results found.  Impression: Tolerated SBRT well.  Plan: Routine follow up with radiation oncology in three months.  He will be scheduled for a chest CT scan prior to this visit.  ____________________________________  Blair Promise, PhD, MD  This document serves as a record of services personally performed by Gery Pray, MD. It was created on his behalf by Clerance Lav, a trained medical scribe. The creation of this record is based on the scribe's personal observations and the provider's statements to them. This document has been checked and approved by the attending provider.

## 2019-04-16 ENCOUNTER — Encounter (HOSPITAL_COMMUNITY): Payer: Self-pay

## 2019-04-16 ENCOUNTER — Ambulatory Visit (HOSPITAL_COMMUNITY)
Admission: EM | Admit: 2019-04-16 | Discharge: 2019-04-16 | Disposition: A | Discharge status: Home / Self Care | Payer: Medicare Other | Attending: Family | Admitting: Family

## 2019-04-16 ENCOUNTER — Other Ambulatory Visit: Payer: Self-pay

## 2019-04-16 DIAGNOSIS — I10 Essential (primary) hypertension: Secondary | ICD-10-CM

## 2019-04-16 MED ORDER — AMLODIPINE BESYLATE 5 MG PO TABS: 5.0000 mg | ORAL_TABLET | Freq: Every day | ORAL | 0 refills | Status: DC

## 2019-04-16 NOTE — Discharge Instructions (Addendum)
Recommend add Norvasc (Amlodipine) 5mg  daily for blood pressure. Continue taking all of your other medications. Continue to monitor blood pressure. As long as you do not develop a headache, dizziness, chest pain, or vision changes, you can follow-up with your PCP as planned on Monday (2 days). If any symptoms develop, go to the ER for further management.

## 2019-04-16 NOTE — ED Provider Notes (Signed)
[place]    CSN: [id number] Arrival date & time: 04/16/19  1216      History   Chief Complaint Chief Complaint  Patient presents with  . Hypertension    HPI Eva Sheamus Hasting. is a 73 y.o. male.   73 year old male presents for concern over elevated blood pressure readings. The past 2 days his BP readings at home have been around 190's to 200/ upper 90's. He is currently asymptomatic. He denies any headache, dizziness, change in vision, chest pain or unusual difficulty breathing. He has a history of HTN and currently on Lisinopril 20mg  daily. He does not recall being on additional medication for BP. Other chronic health issues include Hyperlipidemia, type 2 DM and current lung cancer. He is currently on Lipitor, Metformin, Albuterol inhaler and Anoro Ellipta inhaler daily. He does have an appointment with his PCP on Monday (2 days) but requests to start medication today to help decrease his blood pressure.   The history is provided by the patient.    Past Medical History:  Diagnosis Date  . Allergy   . Diabetes mellitus without complication ([place])    Type II  . Hyperlipidemia   . Hypertension     Patient Active Problem List   Diagnosis Date Noted  . Primary cancer of right upper lobe of lung ([place]) 02/07/2019  . Lung nodule 02/02/2019  . Essential hypertension 05/24/2018  . Dyslipidemia 05/24/2018  . Diabetes ([place]) 06/09/2017    Past Surgical History:  Procedure Laterality Date  . ELECTROMAGNETIC NAVIGATION BROCHOSCOPY Right 02/02/2019   Procedure: VIDEO BRONCHOSCOPY WITH NAVIGATION AND FIDUCIAL PLACEMENT;  Surgeon: Garner Nash, DO;  Location: [facility];  Service: Cardiopulmonary;  Laterality: Right;  . FUDUCIAL PLACEMENT Right 02/02/2019   Procedure: Placement Of Fuducial Right Upper Lobe;  Surgeon: Garner Nash, DO;  Location: [facility];  Service: Cardiopulmonary;  Laterality: Right;  . none    . VIDEO BRONCHOSCOPY WITH RADIAL ENDOBRONCHIAL ULTRASOUND  Right 02/02/2019   Procedure: Video Bronchoscopy With Radial Endobronchial Ultrasound;  Surgeon: Garner Nash, DO;  Location: [facility];  Service: Cardiopulmonary;  Laterality: Right;       Home Medications    Prior to Admission medications   Medication Sig Start Date End Date Taking? Authorizing Provider  albuterol (VENTOLIN HFA) 108 (90 Base) MCG/ACT inhaler Inhale 2 puffs into the lungs every 6 (six) hours as needed for wheezing or shortness of breath. 01/19/19   Icard, Octavio Graves, DO  amLODipine (NORVASC) 5 MG tablet Take 1 tablet (5 mg total) by mouth daily. 04/16/19   Katy Apo, NP  atorvastatin (LIPITOR) 20 MG tablet Take 1 tablet (20 mg total) by mouth daily. 05/24/18   Horald Pollen, MD  cetirizine (ZYRTEC) 10 MG tablet Take 1 tablet (10 mg total) by mouth daily. Patient not taking: Reported on 04/04/2019 09/03/17   Jaynee Eagles, PA-C  lisinopril (ZESTRIL) 20 MG tablet Take 1 tablet by mouth once daily 01/31/19   Horald Pollen, MD  metFORMIN (GLUCOPHAGE) 500 MG tablet Take 1 tablet by mouth once daily with breakfast 01/31/19   Horald Pollen, MD  Multiple Vitamins-Minerals (MULTIVITAMIN MEN) TABS Take 1 tablet by mouth daily.    [provider]  umeclidinium-vilanterol (ANORO ELLIPTA) 62.5-25 MCG/INH AEPB Inhale 1 puff into the lungs daily. 03/03/19 06/01/19  Garner Nash, DO    Family History Family History  Problem Relation Age of Onset  . Lung cancer Neg Hx  Social History Social History   Tobacco Use  . Smoking status: Former Smoker    Packs/day: 1.00    Years: 48.00    Pack years: 48.00    Quit date: 02/20/2015    Years since quitting: 4.1  . Smokeless tobacco: Former Systems developer    Quit date: 10/2013  Substance Use Topics  . Alcohol use: No  . Drug use: No     Allergies   Patient has no known allergies.   Review of Systems Review of Systems  Constitutional: Negative for activity change, appetite change, chills,  diaphoresis, fatigue, fever and unexpected weight change.  HENT: Negative for congestion, facial swelling, mouth sores, nosebleeds, sinus pressure, sinus pain, sore throat and trouble swallowing.   Eyes: Negative for photophobia, pain, discharge, redness, itching and visual disturbance.  Respiratory: Positive for cough and wheezing. Negative for chest tightness and shortness of breath.   Cardiovascular: Negative for chest pain and palpitations.  Gastrointestinal: Negative for abdominal pain, diarrhea, nausea and vomiting.  Musculoskeletal: Negative for arthralgias and myalgias.  Skin: Negative for color change, rash and wound.  Allergic/Immunologic: Positive for immunocompromised state.  Neurological: Negative for dizziness, tremors, seizures, syncope, facial asymmetry, speech difficulty, weakness, light-headedness, numbness and headaches.  Hematological: Negative for adenopathy. Does not bruise/bleed easily.     Physical Exam Triage Vital Signs ED Triage Vitals  Enc Vitals Group     BP 04/16/19 1230 (!) 187/87     Pulse Rate 04/16/19 1334 60     Resp --      Temp 04/16/19 1334 98.4 F (36.9 C)     Temp Source 04/16/19 1334 Oral     SpO2 --      Weight --      Height --      Head Circumference --      Peak Flow --      Pain Score 04/16/19 1333 0     Pain Loc --      Pain Edu? --      Excl. in [place]? --    No data found.  Updated Vital Signs BP (!) 174/80 (BP Location: Right Arm)   Pulse 60   Temp 98.4 F (36.9 C) (Oral)   Visual Acuity Right Eye Distance:   Left Eye Distance:   Bilateral Distance:    Right Eye Near:   Left Eye Near:    Bilateral Near:     Physical Exam Vitals and nursing note reviewed.  Constitutional:      General: He is awake. He is not in acute distress.    Appearance: Normal appearance. He is well-developed and well-groomed. He is not ill-appearing.     Comments: Patient sitting comfortably on exam table in no acute distress.   HENT:      Head: Normocephalic and atraumatic.     Right Ear: Hearing, tympanic membrane, ear canal and external ear normal.     Left Ear: Hearing, tympanic membrane, ear canal and external ear normal.     Nose: Nose normal.     Mouth/Throat:     Lips: Pink.     Mouth: Mucous membranes are moist.     Dentition: Has dentures.     Pharynx: Oropharynx is clear. Uvula midline. No pharyngeal swelling, oropharyngeal exudate, posterior oropharyngeal erythema or uvula swelling.  Eyes:     Extraocular Movements: Extraocular movements intact.     Conjunctiva/sclera: Conjunctivae normal.  Neck:     Vascular: Normal carotid pulses. No carotid bruit, hepatojugular reflux  or JVD.     Trachea: Trachea normal.  Cardiovascular:     Rate and Rhythm: Normal rate and regular rhythm.     Pulses: Normal pulses.     Heart sounds: Normal heart sounds. No murmur.  Pulmonary:     Effort: Pulmonary effort is normal. No respiratory distress.     Breath sounds: Normal air entry. Examination of the right-upper field reveals wheezing. Examination of the left-upper field reveals wheezing. Wheezing present. No decreased breath sounds, rhonchi or rales.  Musculoskeletal:     Cervical back: Normal range of motion and neck supple. No rigidity or tenderness.  Lymphadenopathy:     Cervical: No cervical adenopathy.  Skin:    General: Skin is warm and dry.     Capillary Refill: Capillary refill takes less than 2 seconds.     Findings: No rash.  Neurological:     General: No focal deficit present.     Mental Status: He is alert and oriented to person, place, and time.     Cranial Nerves: Cranial nerves are intact.  Psychiatric:        Mood and Affect: Mood normal.        Behavior: Behavior normal. Behavior is cooperative.        Thought Content: Thought content normal.        Judgment: Judgment normal.      UC Treatments / Results  Labs (all labs ordered are listed, but only abnormal results are displayed) Labs Reviewed -  No data to display  EKG   Radiology No results found.  Procedures Procedures (including critical care time)  Medications Ordered in UC Medications - No data to display  Initial Impression / Assessment and Plan / UC Course  I have reviewed the triage vital signs and the nursing notes.  Pertinent labs & imaging results that were available during my care of the patient were reviewed by me and considered in my medical decision making (see chart for details).    Reviewed blood pressure readings with patient. Discussed elevated systolic blood pressure. Patient is currently stable and asymptomatic.  Since patient does not recall being on additional BP meds, will trial CCB- start Norvasc 5mg  daily. Continue to take all other medications. Continue to monitor blood pressure. If he develops any symptoms (severe headache, dizziness, vision changes, chest pain or numbness), go to the ER ASAP for further evaluation. Otherwise, follow-up with your PCP as planned on Monday (2 days).  Final Clinical Impressions(s) / UC Diagnoses   Final diagnoses:  Essential hypertension  Elevated blood pressure reading with diagnosis of hypertension     Discharge Instructions     Recommend add Norvasc (Amlodipine) 5mg  daily for blood pressure. Continue taking all of your other medications. Continue to monitor blood pressure. As long as you do not develop a headache, dizziness, chest pain, or vision changes, you can follow-up with your PCP as planned on Monday (2 days). If any symptoms develop, go to the ER for further management.     ED Prescriptions    Medication Sig Dispense Auth. Provider   amLODipine (NORVASC) 5 MG tablet Take 1 tablet (5 mg total) by mouth daily. 30 tablet Bridgit Eynon, Nicholes Stairs, NP     PDMP not reviewed this encounter.   Katy Apo, NP 04/17/19 [phone]

## 2019-04-16 NOTE — ED Triage Notes (Signed)
Pt present elevated blood pressure. Pt has been checking his blood pressure on a regular and it has been elevated more then normal.

## 2019-04-16 NOTE — ED Notes (Signed)
Staff pulled patient to the back when he arrived to check his blood pressure and it was 187/87.

## 2019-04-18 ENCOUNTER — Other Ambulatory Visit: Payer: Self-pay

## 2019-04-18 ENCOUNTER — Encounter: Payer: Self-pay | Admitting: Emergency Medicine

## 2019-04-18 ENCOUNTER — Ambulatory Visit (INDEPENDENT_AMBULATORY_CARE_PROVIDER_SITE_OTHER): Payer: Medicare Other | Admitting: Emergency Medicine

## 2019-04-18 VITALS — BP 162/70 | HR 61 | Temp 98.1°F | Wt 161.2 lb

## 2019-04-18 DIAGNOSIS — C3411 Malignant neoplasm of upper lobe, right bronchus or lung: Secondary | ICD-10-CM | POA: Diagnosis not present

## 2019-04-18 DIAGNOSIS — E1169 Type 2 diabetes mellitus with other specified complication: Secondary | ICD-10-CM | POA: Diagnosis not present

## 2019-04-18 DIAGNOSIS — I10 Essential (primary) hypertension: Secondary | ICD-10-CM | POA: Diagnosis not present

## 2019-04-18 DIAGNOSIS — E1159 Type 2 diabetes mellitus with other circulatory complications: Secondary | ICD-10-CM | POA: Diagnosis not present

## 2019-04-18 DIAGNOSIS — E119 Type 2 diabetes mellitus without complications: Secondary | ICD-10-CM

## 2019-04-18 DIAGNOSIS — E785 Hyperlipidemia, unspecified: Secondary | ICD-10-CM

## 2019-04-18 LAB — POCT GLYCOSYLATED HEMOGLOBIN (HGB A1C): Hemoglobin A1C: 7.4 % — AB (ref 4.0–5.6)

## 2019-04-18 LAB — GLUCOSE, POCT (MANUAL RESULT ENTRY): POC Glucose: 132 mg/dl — AB (ref 70–99)

## 2019-04-18 MED ORDER — GLIPIZIDE 5 MG PO TABS: 5.0000 mg | ORAL_TABLET | Freq: Every day | ORAL | 1 refills | Status: DC

## 2019-04-18 MED ORDER — BLOOD PRESSURE MONITOR/ARM DEVI: 0 refills | Status: DC

## 2019-04-18 MED ORDER — BLOOD GLUCOSE METER KIT: PACK | 3 refills | Status: DC

## 2019-04-18 MED ORDER — LISINOPRIL 40 MG PO TABS: 40.0000 mg | ORAL_TABLET | Freq: Every day | ORAL | 3 refills | Status: DC

## 2019-04-18 MED ORDER — METFORMIN HCL 500 MG PO TABS: 500.0000 mg | ORAL_TABLET | Freq: Two times a day (BID) | ORAL | 1 refills | Status: DC

## 2019-04-18 NOTE — Patient Instructions (Addendum)
Take Metformin 500 mg twice a day. Start glipizide 5 mg with breakfast. Increase lisinopril to 40 mg daily. Continue amlodipine 5 mg daily. Follow-up in 3 months.    If you have lab work done today you will be contacted with your lab results within the next 2 weeks.  If you have not heard from [place] then please contact us. The fastest way to get your results is to register for My Chart.   IF you received an x-ray today, you will receive an invoice from [facility]. Please contact [facility] at [phone] with questions or concerns regarding your invoice.   IF you received labwork today, you will receive an invoice from [place]. Please contact [facility] at [phone] with questions or concerns regarding your invoice.   Our billing staff will not be able to assist you with questions regarding bills from these companies.  You will be contacted with the lab results as soon as they are available. The fastest way to get your results is to activate your My Chart account. Instructions are located on the last page of this paperwork. If you have not heard from [place] regarding the results in 2 weeks, please contact this office.      Diabetes Mellitus and Nutrition, Adult When you have diabetes (diabetes mellitus), it is very important to have healthy eating habits because your blood sugar (glucose) levels are greatly affected by what you eat and drink. Eating healthy foods in the appropriate amounts, at about the same times every day, can help you:  Control your blood glucose.  Lower your risk of heart disease.  Improve your blood pressure.  Reach or maintain a healthy weight. Every person with diabetes is different, and each person has different needs for a meal plan. Your health care provider may recommend that you work with a diet and nutrition specialist (dietitian) to make a meal plan that is best for you. Your meal plan may vary depending on factors such as:  The  calories you need.  The medicines you take.  Your weight.  Your blood glucose, blood pressure, and cholesterol levels.  Your activity level.  Other health conditions you have, such as heart or kidney disease. How do carbohydrates affect me? Carbohydrates, also called carbs, affect your blood glucose level more than any other type of food. Eating carbs naturally raises the amount of glucose in your blood. Carb counting is a method for keeping track of how many carbs you eat. Counting carbs is important to keep your blood glucose at a healthy level, especially if you use insulin or take certain oral diabetes medicines. It is important to know how many carbs you can safely have in each meal. This is different for every person. Your dietitian can help you calculate how many carbs you should have at each meal and for each snack. Foods that contain carbs include:  Bread, cereal, rice, pasta, and crackers.  Potatoes and corn.  Peas, beans, and lentils.  Milk and yogurt.  Fruit and juice.  Desserts, such as cakes, cookies, ice cream, and candy. How does alcohol affect me? Alcohol can cause a sudden decrease in blood glucose (hypoglycemia), especially if you use insulin or take certain oral diabetes medicines. Hypoglycemia can be a life-threatening condition. Symptoms of hypoglycemia (sleepiness, dizziness, and confusion) are similar to symptoms of having too much alcohol. If your health care provider says that alcohol is safe for you, follow these guidelines:  Limit alcohol intake to no more than 1 drink  per day for nonpregnant women and 2 drinks per day for men. One drink equals 12 oz of beer, 5 oz of wine, or 1 oz of hard liquor.  Do not drink on an empty stomach.  Keep yourself hydrated with water, diet soda, or unsweetened iced tea.  Keep in mind that regular soda, juice, and other mixers may contain a lot of sugar and must be counted as carbs. What are tips for following this  plan?  Reading food labels  Start by checking the serving size on the "Nutrition Facts" label of packaged foods and drinks. The amount of calories, carbs, fats, and other nutrients listed on the label is based on one serving of the item. Many items contain more than one serving per package.  Check the total grams (g) of carbs in one serving. You can calculate the number of servings of carbs in one serving by dividing the total carbs by 15. For example, if a food has 30 g of total carbs, it would be equal to 2 servings of carbs.  Check the number of grams (g) of saturated and trans fats in one serving. Choose foods that have low or no amount of these fats.  Check the number of milligrams (mg) of salt (sodium) in one serving. Most people should limit total sodium intake to less than 2,300 mg per day.  Always check the nutrition information of foods labeled as "low-fat" or "nonfat". These foods may be higher in added sugar or refined carbs and should be avoided.  Talk to your dietitian to identify your daily goals for nutrients listed on the label. Shopping  Avoid buying canned, premade, or processed foods. These foods tend to be high in fat, sodium, and added sugar.  Shop around the outside edge of the grocery store. This includes fresh fruits and vegetables, bulk grains, fresh meats, and fresh dairy. Cooking  Use low-heat cooking methods, such as baking, instead of high-heat cooking methods like deep frying.  Cook using healthy oils, such as olive, canola, or sunflower oil.  Avoid cooking with butter, cream, or high-fat meats. Meal planning  Eat meals and snacks regularly, preferably at the same times every day. Avoid going long periods of time without eating.  Eat foods high in fiber, such as fresh fruits, vegetables, beans, and whole grains. Talk to your dietitian about how many servings of carbs you can eat at each meal.  Eat 4-6 ounces (oz) of lean protein each day, such as lean  meat, chicken, fish, eggs, or tofu. One oz of lean protein is equal to: ? 1 oz of meat, chicken, or fish. ? 1 egg. ?  cup of tofu.  Eat some foods each day that contain healthy fats, such as avocado, nuts, seeds, and fish. Lifestyle  Check your blood glucose regularly.  Exercise regularly as told by your health care provider. This may include: ? 150 minutes of moderate-intensity or vigorous-intensity exercise each week. This could be brisk walking, biking, or water aerobics. ? Stretching and doing strength exercises, such as yoga or weightlifting, at least 2 times a week.  Take medicines as told by your health care provider.  Do not use any products that contain nicotine or tobacco, such as cigarettes and e-cigarettes. If you need help quitting, ask your health care provider.  Work with a Social worker or diabetes educator to identify strategies to manage stress and any emotional and social challenges. Questions to ask a health care provider  Do I need to meet  with a diabetes educator?  Do I need to meet with a dietitian?  What number can I call if I have questions?  When are the best times to check my blood glucose? Where to find more information:  American Diabetes Association: diabetes.org  Academy of Nutrition and Dietetics: www.eatright.[employer] of Diabetes and Digestive and Kidney Diseases (NIH): [employer] Summary  A healthy meal plan will help you control your blood glucose and maintain a healthy lifestyle.  Working with a diet and nutrition specialist (dietitian) can help you make a meal plan that is best for you.  Keep in mind that carbohydrates (carbs) and alcohol have immediate effects on your blood glucose levels. It is important to count carbs and to use alcohol carefully. This information is not intended to replace advice given to you by your health care provider. Make sure you discuss any questions you have with your health care  provider. Document Released: 01/16/2005 Document Revised: 04/03/2017 Document Reviewed: 05/26/2016 Elsevier Patient Education  2020 [employer].  Hypertension, Adult High blood pressure (hypertension) is when the force of blood pumping through the arteries is too strong. The arteries are the blood vessels that carry blood from the heart throughout the body. Hypertension forces the heart to work harder to pump blood and may cause arteries to become narrow or stiff. Untreated or uncontrolled hypertension can cause a heart attack, heart failure, a stroke, kidney disease, and other problems. A blood pressure reading consists of a higher number over a lower number. Ideally, your blood pressure should be below 120/80. The first ("top") number is called the systolic pressure. It is a measure of the pressure in your arteries as your heart beats. The second ("bottom") number is called the diastolic pressure. It is a measure of the pressure in your arteries as the heart relaxes. What are the causes? The exact cause of this condition is not known. There are some conditions that result in or are related to high blood pressure. What increases the risk? Some risk factors for high blood pressure are under your control. The following factors may make you more likely to develop this condition:  Smoking.  Having type 2 diabetes mellitus, high cholesterol, or both.  Not getting enough exercise or physical activity.  Being overweight.  Having too much fat, sugar, calories, or salt (sodium) in your diet.  Drinking too much alcohol. Some risk factors for high blood pressure may be difficult or impossible to change. Some of these factors include:  Having chronic kidney disease.  Having a family history of high blood pressure.  Age. Risk increases with age.  Race. You may be at higher risk if you are African American.  Gender. Men are at higher risk than women before age 57. After age 48, women are at  higher risk than men.  Having obstructive sleep apnea.  Stress. What are the signs or symptoms? High blood pressure may not cause symptoms. Very high blood pressure (hypertensive crisis) may cause:  Headache.  Anxiety.  Shortness of breath.  Nosebleed.  Nausea and vomiting.  Vision changes.  Severe chest pain.  Seizures. How is this diagnosed? This condition is diagnosed by measuring your blood pressure while you are seated, with your arm resting on a flat surface, your legs uncrossed, and your feet flat on the floor. The cuff of the blood pressure monitor will be placed directly against the skin of your upper arm at the level of your heart. It should be measured at  least twice using the same arm. Certain conditions can cause a difference in blood pressure between your right and left arms. Certain factors can cause blood pressure readings to be lower or higher than normal for a short period of time:  When your blood pressure is higher when you are in a health care provider's office than when you are at home, this is called white coat hypertension. Most people with this condition do not need medicines.  When your blood pressure is higher at home than when you are in a health care provider's office, this is called masked hypertension. Most people with this condition may need medicines to control blood pressure. If you have a high blood pressure reading during one visit or you have normal blood pressure with other risk factors, you may be asked to:  Return on a different day to have your blood pressure checked again.  Monitor your blood pressure at home for 1 week or longer. If you are diagnosed with hypertension, you may have other blood or imaging tests to help your health care provider understand your overall risk for other conditions. How is this treated? This condition is treated by making healthy lifestyle changes, such as eating healthy foods, exercising more, and reducing  your alcohol intake. Your health care provider may prescribe medicine if lifestyle changes are not enough to get your blood pressure under control, and if:  Your systolic blood pressure is above 130.  Your diastolic blood pressure is above 80. Your personal target blood pressure may vary depending on your medical conditions, your age, and other factors. Follow these instructions at home: Eating and drinking   Eat a diet that is high in fiber and potassium, and low in sodium, added sugar, and fat. An example eating plan is called the DASH (Dietary Approaches to Stop Hypertension) diet. To eat this way: ? Eat plenty of fresh fruits and vegetables. Try to fill one half of your plate at each meal with fruits and vegetables. ? Eat whole grains, such as whole-wheat pasta, brown rice, or whole-grain bread. Fill about one fourth of your plate with whole grains. ? Eat or drink low-fat dairy products, such as skim milk or low-fat yogurt. ? Avoid fatty cuts of meat, processed or cured meats, and poultry with skin. Fill about one fourth of your plate with lean proteins, such as fish, chicken without skin, beans, eggs, or tofu. ? Avoid pre-made and processed foods. These tend to be higher in sodium, added sugar, and fat.  Reduce your daily sodium intake. Most people with hypertension should eat less than 1,500 mg of sodium a day.  Do not drink alcohol if: ? Your health care provider tells you not to drink. ? You are pregnant, may be pregnant, or are planning to become pregnant.  If you drink alcohol: ? Limit how much you use to:  0-1 drink a day for women.  0-2 drinks a day for men. ? Be aware of how much alcohol is in your drink. In the [place] one drink equals one 12 oz bottle of beer (355 mL), one 5 oz glass of wine (148 mL), or one 1 oz glass of hard liquor (44 mL). Lifestyle   Work with your health care provider to maintain a healthy body weight or to lose weight. Ask what an ideal weight is  for you.  Get at least 30 minutes of exercise most days of the week. Activities may include walking, swimming, or biking.  Include exercise to  strengthen your muscles (resistance exercise), such as Pilates or lifting weights, as part of your weekly exercise routine. Try to do these types of exercises for 30 minutes at least 3 days a week.  Do not use any products that contain nicotine or tobacco, such as cigarettes, e-cigarettes, and chewing tobacco. If you need help quitting, ask your health care provider.  Monitor your blood pressure at home as told by your health care provider.  Keep all follow-up visits as told by your health care provider. This is important. Medicines  Take over-the-counter and prescription medicines only as told by your health care provider. Follow directions carefully. Blood pressure medicines must be taken as prescribed.  Do not skip doses of blood pressure medicine. Doing this puts you at risk for problems and can make the medicine less effective.  Ask your health care provider about side effects or reactions to medicines that you should watch for. Contact a health care provider if you:  Think you are having a reaction to a medicine you are taking.  Have headaches that keep coming back (recurring).  Feel dizzy.  Have swelling in your ankles.  Have trouble with your vision. Get help right away if you:  Develop a severe headache or confusion.  Have unusual weakness or numbness.  Feel faint.  Have severe pain in your chest or abdomen.  Vomit repeatedly.  Have trouble breathing. Summary  Hypertension is when the force of blood pumping through your arteries is too strong. If this condition is not controlled, it may put you at risk for serious complications.  Your personal target blood pressure may vary depending on your medical conditions, your age, and other factors. For most people, a normal blood pressure is less than 120/80.  Hypertension is  treated with lifestyle changes, medicines, or a combination of both. Lifestyle changes include losing weight, eating a healthy, low-sodium diet, exercising more, and limiting alcohol. This information is not intended to replace advice given to you by your health care provider. Make sure you discuss any questions you have with your health care provider. Document Released: 04/21/2005 Document Revised: 12/30/2017 Document Reviewed: 12/30/2017 Elsevier Patient Education  2020 [employer].

## 2019-04-18 NOTE — Progress Notes (Signed)
BP Readings from Last 3 Encounters:  04/18/19 (!) 162/70  04/16/19 (!) 174/80  04/04/19 (!) 143/94   Lab Results  Component Value Date   CREATININE 0.86 01/31/2019   BUN 15 01/31/2019   NA 139 01/31/2019   K 4.3 01/31/2019   CL 101 01/31/2019   CO2 30 01/31/2019   Lab Results  Component Value Date   HGBA1C 8.4 (H) 11/24/2018   Lab Results  Component Value Date   CHOL 189 11/24/2018   HDL 58 11/24/2018   LDLCALC 114 (H) 11/24/2018   TRIG 83 11/24/2018   CHOLHDL 3.3 11/24/2018   Benjamin Florina Ou. 73 y.o.   Chief Complaint  Patient presents with  . Hypertension    ED f/u for [facility] (requesting meters for BP and Glucose. pended)    HISTORY OF PRESENT ILLNESS: This is a 73 y.o. male here for follow-up on chronic medical problems: 1.  Hypertension: Seen 2 days ago at the urgent care center for hypertensive urgency.  Started on amlodipine 5 mg daily.  Had been taking lisinopril 20 mg daily. BP Readings from Last 3 Encounters:  04/18/19 (!) 162/70  04/16/19 (!) 174/80  04/04/19 (!) 143/94    2.  Diabetes: Taking Metformin 500 mg once a day.  Seen by me last July and found to have higher hemoglobin A1c at 8.4.  Advised to start glipizide 5 mg daily and increase metformin to twice a day but patient not taking medications as advised. 3.  Dyslipidemia: On atorvastatin 20 mg daily. 4.  History of lung cancer  HPI   Prior to Admission medications   Medication Sig Start Date End Date Taking? Authorizing Provider  albuterol (VENTOLIN HFA) 108 (90 Base) MCG/ACT inhaler Inhale 2 puffs into the lungs every 6 (six) hours as needed for wheezing or shortness of breath. 01/19/19  Yes Icard, Octavio Graves, DO  metFORMIN (GLUCOPHAGE) 500 MG tablet Take 1 tablet by mouth once daily with breakfast 01/31/19  Yes Jashawna Reever, Ines Bloomer, MD  Multiple Vitamins-Minerals (MULTIVITAMIN MEN) TABS Take 1 tablet by mouth daily.   Yes [provider]  amLODipine (NORVASC) 5 MG tablet Take 1 tablet  (5 mg total) by mouth daily. 04/16/19   Katy Apo, NP  atorvastatin (LIPITOR) 20 MG tablet Take 1 tablet (20 mg total) by mouth daily. 05/24/18   Horald Pollen, MD  blood glucose meter kit and supplies Per insurance preference. Check blood glucose once a day. Dx E11.9 04/18/19   Horald Pollen, MD  Blood Pressure Monitoring (BLOOD PRESSURE MONITOR/ARM) DEVI Check BP once a day. DX I 10 04/18/19   Horald Pollen, MD  cetirizine (ZYRTEC) 10 MG tablet Take 1 tablet (10 mg total) by mouth daily. Patient not taking: Reported on 04/04/2019 09/03/17   Jaynee Eagles, PA-C  lisinopril (ZESTRIL) 20 MG tablet Take 1 tablet by mouth once daily 01/31/19   Horald Pollen, MD  umeclidinium-vilanterol [facility] ELLIPTA) 62.5-25 MCG/INH AEPB Inhale 1 puff into the lungs daily. 03/03/19 06/01/19  Garner Nash, DO    No Known Allergies  Patient Active Problem List   Diagnosis Date Noted  . Primary cancer of right upper lobe of lung ([place]) 02/07/2019  . Lung nodule 02/02/2019  . Essential hypertension 05/24/2018  . Dyslipidemia 05/24/2018  . Diabetes ([place]) 06/09/2017    Past Medical History:  Diagnosis Date  . Allergy   . Diabetes mellitus without complication ([place])    Type II  . Hyperlipidemia   .  Hypertension     Past Surgical History:  Procedure Laterality Date  . ELECTROMAGNETIC NAVIGATION BROCHOSCOPY Right 02/02/2019   Procedure: VIDEO BRONCHOSCOPY WITH NAVIGATION AND FIDUCIAL PLACEMENT;  Surgeon: Garner Nash, DO;  Location: [place];  Service: Cardiopulmonary;  Laterality: Right;  . FUDUCIAL PLACEMENT Right 02/02/2019   Procedure: Placement Of Fuducial Right Upper Lobe;  Surgeon: Garner Nash, DO;  Location: [place];  Service: Cardiopulmonary;  Laterality: Right;  . none    . VIDEO BRONCHOSCOPY WITH RADIAL ENDOBRONCHIAL ULTRASOUND Right 02/02/2019   Procedure: Video Bronchoscopy With Radial Endobronchial Ultrasound;  Surgeon: Garner Nash, DO;  Location: [place];  Service: Cardiopulmonary;  Laterality: Right;    Social History   Socioeconomic History  . Marital status: Single    Spouse name: Not on file  . Number of children: 0  . Years of education: Not on file  . Highest education level: 9th grade  Occupational History  . Not on file  Tobacco Use  . Smoking status: Former Smoker    Packs/day: 1.00    Years: 48.00    Pack years: 48.00    Quit date: 02/20/2015    Years since quitting: 4.1  . Smokeless tobacco: Former Systems developer    Quit date: 10/2013  Substance and Sexual Activity  . Alcohol use: No  . Drug use: No  . Sexual activity: Yes    Birth control/protection: Condom  Other Topics Concern  . Not on file  Social History Narrative  . Not on file   Social Determinants of Health   Financial Resource Strain:   . Difficulty of Paying Living Expenses: Not on file  Food Insecurity:   . Worried About Charity fundraiser in the Last Year: Not on file  . Ran Out of Food in the Last Year: Not on file  Transportation Needs:   . Lack of Transportation (Medical): Not on file  . Lack of Transportation (Non-Medical): Not on file  Physical Activity:   . Days of Exercise per Week: Not on file  . Minutes of Exercise per Session: Not on file  Stress:   . Feeling of Stress : Not on file  Social Connections:   . Frequency of Communication with Friends and Family: Not on file  . Frequency of Social Gatherings with Friends and Family: Not on file  . Attends Religious Services: Not on file  . Active Member of Clubs or Organizations: Not on file  . Attends Archivist Meetings: Not on file  . Marital Status: Not on file  Intimate Partner Violence:   . Fear of Current or Ex-Partner: Not on file  . Emotionally Abused: Not on file  . Physically Abused: Not on file  . Sexually Abused: Not on file    Family History  Problem Relation Age of Onset  . Lung cancer Neg Hx      Review of Systems  Constitutional: Negative.  Negative  for chills and fever.  HENT: Negative for congestion and sore throat.   Respiratory: Negative.  Negative for cough and shortness of breath.   Cardiovascular: Negative.  Negative for chest pain and leg swelling.  Gastrointestinal: Negative.  Negative for abdominal pain, blood in stool, diarrhea, nausea and vomiting.  Genitourinary: Negative.  Negative for hematuria.  Musculoskeletal: Negative for myalgias and neck pain.  Skin: Negative.  Negative for rash.  Neurological: Negative.  Negative for dizziness and headaches.  All other systems reviewed and are negative.  Today's Vitals  04/18/19 1002 04/18/19 1008  BP: (!) 177/85 (!) 162/70  Pulse: 61   Temp: 98.1 F (36.7 C)   TempSrc: Temporal   SpO2: 97%   Weight: 161 lb 3.2 oz (73.1 kg)    Body mass index is 25.63 kg/m.   Physical Exam Vitals reviewed.  Constitutional:      Appearance: Normal appearance.  HENT:     Head: Normocephalic.  Eyes:     Extraocular Movements: Extraocular movements intact.     Conjunctiva/sclera: Conjunctivae normal.     Pupils: Pupils are equal, round, and reactive to light.  Cardiovascular:     Rate and Rhythm: Normal rate and regular rhythm.     Heart sounds: Normal heart sounds.  Pulmonary:     Effort: Pulmonary effort is normal.     Breath sounds: Normal breath sounds.  Abdominal:     Palpations: Abdomen is soft.     Tenderness: There is no abdominal tenderness.  Musculoskeletal:        General: Normal range of motion.     Cervical back: Normal range of motion and neck supple.  Skin:    General: Skin is warm and dry.     Capillary Refill: Capillary refill takes less than 2 seconds.  Neurological:     General: No focal deficit present.     Mental Status: He is alert and oriented to person, place, and time.  Psychiatric:        Mood and Affect: Mood normal.        Behavior: Behavior normal.    Results for orders placed or performed in visit on 04/18/19 (from the past 24 hour(s))    POCT glucose (manual entry)     Status: Abnormal   Collection Time: 04/18/19 11:10 AM  Result Value Ref Range   POC Glucose 132 (A) 70 - 99 mg/dl  POCT glycosylated hemoglobin (Hb A1C)     Status: Abnormal   Collection Time: 04/18/19 11:12 AM  Result Value Ref Range   Hemoglobin A1C 7.4 (A) 4.0 - 5.6 %   HbA1c POC (<> result, manual entry)     HbA1c, POC (prediabetic range)     HbA1c, POC (controlled diabetic range)     A total of 25 minutes was spent in the room with the patient, greater than 50% of which was in counseling/coordination of care regarding diabetes and hypertension, management, medication changes, hypoglycemic precautions, cardiovascular risks associated with these conditions, diet and nutrition, review of most recent blood work, prognosis, and need for follow-up.   ASSESSMENT & PLAN: Hypertension associated with type 2 diabetes mellitus (HCC) Systolic blood pressure still elevated.  Will increase lisinopril to 40 mg daily and continue amlodipine at 5 mg daily. Diabetes still uncontrolled.  Hemoglobin A1c at 7.4.  Needs to take metformin 500 mg twice a day and start glipizide 5 mg daily. Continue atorvastatin 20 mg daily. Follow-up in 3 months.  Magdaleno was seen today for hypertension.  Diagnoses and all orders for this visit:  Hypertension associated with type 2 diabetes mellitus ([place]) -     blood glucose meter kit and supplies; Per insurance preference. Check blood glucose once a day. Dx E11.9 -     Blood Pressure Monitoring (BLOOD PRESSURE MONITOR/ARM) DEVI; Check BP once a day. DX I 10 -     POCT glucose (manual entry) -     POCT glycosylated hemoglobin (Hb A1C)  Dyslipidemia associated with type 2 diabetes mellitus ([place])  Primary cancer of right upper  lobe of lung ([place])  Type 2 diabetes mellitus without complication, without long-term current use of insulin (HCC) -     metFORMIN (GLUCOPHAGE) 500 MG tablet; Take 1 tablet (500 mg total) by mouth 2 (two) times  daily with a meal. Take 1 tablet by mouth once daily with breakfast  Essential hypertension -     lisinopril (ZESTRIL) 40 MG tablet; Take 1 tablet (40 mg total) by mouth daily.  Other orders -     glipiZIDE (GLUCOTROL) 5 MG tablet; Take 1 tablet (5 mg total) by mouth daily before breakfast.    Patient Instructions   Take Metformin 500 mg twice a day. Start glipizide 5 mg with breakfast. Increase lisinopril to 40 mg daily. Continue amlodipine 5 mg daily. Follow-up in 3 months.    If you have lab work done today you will be contacted with your lab results within the next 2 weeks.  If you have not heard from [place] then please contact us. The fastest way to get your results is to register for My Chart.   IF you received an x-ray today, you will receive an invoice from [facility]. Please contact [facility] at [phone] with questions or concerns regarding your invoice.   IF you received labwork today, you will receive an invoice from [facility]. Please contact [facility] at [phone] with questions or concerns regarding your invoice.   Our billing staff will not be able to assist you with questions regarding bills from these companies.  You will be contacted with the lab results as soon as they are available. The fastest way to get your results is to activate your My Chart account. Instructions are located on the last page of this paperwork. If you have not heard from [place] regarding the results in 2 weeks, please contact this office.      Diabetes Mellitus and Nutrition, Adult When you have diabetes (diabetes mellitus), it is very important to have healthy eating habits because your blood sugar (glucose) levels are greatly affected by what you eat and drink. Eating healthy foods in the appropriate amounts, at about the same times every day, can help you:  Control your blood glucose.  Lower your risk of heart disease.  Improve your blood pressure.  Reach or  maintain a healthy weight. Every person with diabetes is different, and each person has different needs for a meal plan. Your health care provider may recommend that you work with a diet and nutrition specialist (dietitian) to make a meal plan that is best for you. Your meal plan may vary depending on factors such as:  The calories you need.  The medicines you take.  Your weight.  Your blood glucose, blood pressure, and cholesterol levels.  Your activity level.  Other health conditions you have, such as heart or kidney disease. How do carbohydrates affect me? Carbohydrates, also called carbs, affect your blood glucose level more than any other type of food. Eating carbs naturally raises the amount of glucose in your blood. Carb counting is a method for keeping track of how many carbs you eat. Counting carbs is important to keep your blood glucose at a healthy level, especially if you use insulin or take certain oral diabetes medicines. It is important to know how many carbs you can safely have in each meal. This is different for every person. Your dietitian can help you calculate how many carbs you should have at each meal and for each snack. Foods that contain carbs include:  Bread, cereal, rice, pasta, and crackers.  Potatoes and corn.  Peas, beans, and lentils.  Milk and yogurt.  Fruit and juice.  Desserts, such as cakes, cookies, ice cream, and candy. How does alcohol affect me? Alcohol can cause a sudden decrease in blood glucose (hypoglycemia), especially if you use insulin or take certain oral diabetes medicines. Hypoglycemia can be a life-threatening condition. Symptoms of hypoglycemia (sleepiness, dizziness, and confusion) are similar to symptoms of having too much alcohol. If your health care provider says that alcohol is safe for you, follow these guidelines:  Limit alcohol intake to no more than 1 drink per day for nonpregnant women and 2 drinks per day for men. One  drink equals 12 oz of beer, 5 oz of wine, or 1 oz of hard liquor.  Do not drink on an empty stomach.  Keep yourself hydrated with water, diet soda, or unsweetened iced tea.  Keep in mind that regular soda, juice, and other mixers may contain a lot of sugar and must be counted as carbs. What are tips for following this plan?  Reading food labels  Start by checking the serving size on the "Nutrition Facts" label of packaged foods and drinks. The amount of calories, carbs, fats, and other nutrients listed on the label is based on one serving of the item. Many items contain more than one serving per package.  Check the total grams (g) of carbs in one serving. You can calculate the number of servings of carbs in one serving by dividing the total carbs by 15. For example, if a food has 30 g of total carbs, it would be equal to 2 servings of carbs.  Check the number of grams (g) of saturated and trans fats in one serving. Choose foods that have low or no amount of these fats.  Check the number of milligrams (mg) of salt (sodium) in one serving. Most people should limit total sodium intake to less than 2,300 mg per day.  Always check the nutrition information of foods labeled as "low-fat" or "nonfat". These foods may be higher in added sugar or refined carbs and should be avoided.  Talk to your dietitian to identify your daily goals for nutrients listed on the label. Shopping  Avoid buying canned, premade, or processed foods. These foods tend to be high in fat, sodium, and added sugar.  Shop around the outside edge of the grocery store. This includes fresh fruits and vegetables, bulk grains, fresh meats, and fresh dairy. Cooking  Use low-heat cooking methods, such as baking, instead of high-heat cooking methods like deep frying.  Cook using healthy oils, such as olive, canola, or sunflower oil.  Avoid cooking with butter, cream, or high-fat meats. Meal planning  Eat meals and snacks  regularly, preferably at the same times every day. Avoid going long periods of time without eating.  Eat foods high in fiber, such as fresh fruits, vegetables, beans, and whole grains. Talk to your dietitian about how many servings of carbs you can eat at each meal.  Eat 4-6 ounces (oz) of lean protein each day, such as lean meat, chicken, fish, eggs, or tofu. One oz of lean protein is equal to: ? 1 oz of meat, chicken, or fish. ? 1 egg. ?  cup of tofu.  Eat some foods each day that contain healthy fats, such as avocado, nuts, seeds, and fish. Lifestyle  Check your blood glucose regularly.  Exercise regularly as told by your health care provider. This may  include: ? 150 minutes of moderate-intensity or vigorous-intensity exercise each week. This could be brisk walking, biking, or water aerobics. ? Stretching and doing strength exercises, such as yoga or weightlifting, at least 2 times a week.  Take medicines as told by your health care provider.  Do not use any products that contain nicotine or tobacco, such as cigarettes and e-cigarettes. If you need help quitting, ask your health care provider.  Work with a Social worker or diabetes educator to identify strategies to manage stress and any emotional and social challenges. Questions to ask a health care provider  Do I need to meet with a diabetes educator?  Do I need to meet with a dietitian?  What number can I call if I have questions?  When are the best times to check my blood glucose? Where to find more information:  American Diabetes Association: diabetes.org  Academy of Nutrition and Dietetics: www.eatright.[employer] of Diabetes and Digestive and Kidney Diseases (NIH): [employer] Summary  A healthy meal plan will help you control your blood glucose and maintain a healthy lifestyle.  Working with a diet and nutrition specialist (dietitian) can help you make a meal plan that is best for you.  Keep in  mind that carbohydrates (carbs) and alcohol have immediate effects on your blood glucose levels. It is important to count carbs and to use alcohol carefully. This information is not intended to replace advice given to you by your health care provider. Make sure you discuss any questions you have with your health care provider. Document Released: 01/16/2005 Document Revised: 04/03/2017 Document Reviewed: 05/26/2016 Elsevier Patient Education  2020 [employer].  Hypertension, Adult High blood pressure (hypertension) is when the force of blood pumping through the arteries is too strong. The arteries are the blood vessels that carry blood from the heart throughout the body. Hypertension forces the heart to work harder to pump blood and may cause arteries to become narrow or stiff. Untreated or uncontrolled hypertension can cause a heart attack, heart failure, a stroke, kidney disease, and other problems. A blood pressure reading consists of a higher number over a lower number. Ideally, your blood pressure should be below 120/80. The first ("top") number is called the systolic pressure. It is a measure of the pressure in your arteries as your heart beats. The second ("bottom") number is called the diastolic pressure. It is a measure of the pressure in your arteries as the heart relaxes. What are the causes? The exact cause of this condition is not known. There are some conditions that result in or are related to high blood pressure. What increases the risk? Some risk factors for high blood pressure are under your control. The following factors may make you more likely to develop this condition:  Smoking.  Having type 2 diabetes mellitus, high cholesterol, or both.  Not getting enough exercise or physical activity.  Being overweight.  Having too much fat, sugar, calories, or salt (sodium) in your diet.  Drinking too much alcohol. Some risk factors for high blood pressure may be difficult or  impossible to change. Some of these factors include:  Having chronic kidney disease.  Having a family history of high blood pressure.  Age. Risk increases with age.  Race. You may be at higher risk if you are African American.  Gender. Men are at higher risk than women before age 25. After age 28, women are at higher risk than men.  Having obstructive sleep apnea.  Stress. What are  the signs or symptoms? High blood pressure may not cause symptoms. Very high blood pressure (hypertensive crisis) may cause:  Headache.  Anxiety.  Shortness of breath.  Nosebleed.  Nausea and vomiting.  Vision changes.  Severe chest pain.  Seizures. How is this diagnosed? This condition is diagnosed by measuring your blood pressure while you are seated, with your arm resting on a flat surface, your legs uncrossed, and your feet flat on the floor. The cuff of the blood pressure monitor will be placed directly against the skin of your upper arm at the level of your heart. It should be measured at least twice using the same arm. Certain conditions can cause a difference in blood pressure between your right and left arms. Certain factors can cause blood pressure readings to be lower or higher than normal for a short period of time:  When your blood pressure is higher when you are in a health care provider's office than when you are at home, this is called white coat hypertension. Most people with this condition do not need medicines.  When your blood pressure is higher at home than when you are in a health care provider's office, this is called masked hypertension. Most people with this condition may need medicines to control blood pressure. If you have a high blood pressure reading during one visit or you have normal blood pressure with other risk factors, you may be asked to:  Return on a different day to have your blood pressure checked again.  Monitor your blood pressure at home for 1 week or  longer. If you are diagnosed with hypertension, you may have other blood or imaging tests to help your health care provider understand your overall risk for other conditions. How is this treated? This condition is treated by making healthy lifestyle changes, such as eating healthy foods, exercising more, and reducing your alcohol intake. Your health care provider may prescribe medicine if lifestyle changes are not enough to get your blood pressure under control, and if:  Your systolic blood pressure is above 130.  Your diastolic blood pressure is above 80. Your personal target blood pressure may vary depending on your medical conditions, your age, and other factors. Follow these instructions at home: Eating and drinking   Eat a diet that is high in fiber and potassium, and low in sodium, added sugar, and fat. An example eating plan is called the DASH (Dietary Approaches to Stop Hypertension) diet. To eat this way: ? Eat plenty of fresh fruits and vegetables. Try to fill one half of your plate at each meal with fruits and vegetables. ? Eat whole grains, such as whole-wheat pasta, brown rice, or whole-grain bread. Fill about one fourth of your plate with whole grains. ? Eat or drink low-fat dairy products, such as skim milk or low-fat yogurt. ? Avoid fatty cuts of meat, processed or cured meats, and poultry with skin. Fill about one fourth of your plate with lean proteins, such as fish, chicken without skin, beans, eggs, or tofu. ? Avoid pre-made and processed foods. These tend to be higher in sodium, added sugar, and fat.  Reduce your daily sodium intake. Most people with hypertension should eat less than 1,500 mg of sodium a day.  Do not drink alcohol if: ? Your health care provider tells you not to drink. ? You are pregnant, may be pregnant, or are planning to become pregnant.  If you drink alcohol: ? Limit how much you use to:  0-1 drink  a day for women.  0-2 drinks a day for  men. ? Be aware of how much alcohol is in your drink. In the [place] one drink equals one 12 oz bottle of beer (355 mL), one 5 oz glass of wine (148 mL), or one 1 oz glass of hard liquor (44 mL). Lifestyle   Work with your health care provider to maintain a healthy body weight or to lose weight. Ask what an ideal weight is for you.  Get at least 30 minutes of exercise most days of the week. Activities may include walking, swimming, or biking.  Include exercise to strengthen your muscles (resistance exercise), such as Pilates or lifting weights, as part of your weekly exercise routine. Try to do these types of exercises for 30 minutes at least 3 days a week.  Do not use any products that contain nicotine or tobacco, such as cigarettes, e-cigarettes, and chewing tobacco. If you need help quitting, ask your health care provider.  Monitor your blood pressure at home as told by your health care provider.  Keep all follow-up visits as told by your health care provider. This is important. Medicines  Take over-the-counter and prescription medicines only as told by your health care provider. Follow directions carefully. Blood pressure medicines must be taken as prescribed.  Do not skip doses of blood pressure medicine. Doing this puts you at risk for problems and can make the medicine less effective.  Ask your health care provider about side effects or reactions to medicines that you should watch for. Contact a health care provider if you:  Think you are having a reaction to a medicine you are taking.  Have headaches that keep coming back (recurring).  Feel dizzy.  Have swelling in your ankles.  Have trouble with your vision. Get help right away if you:  Develop a severe headache or confusion.  Have unusual weakness or numbness.  Feel faint.  Have severe pain in your chest or abdomen.  Vomit repeatedly.  Have trouble breathing. Summary  Hypertension is when the force of blood  pumping through your arteries is too strong. If this condition is not controlled, it may put you at risk for serious complications.  Your personal target blood pressure may vary depending on your medical conditions, your age, and other factors. For most people, a normal blood pressure is less than 120/80.  Hypertension is treated with lifestyle changes, medicines, or a combination of both. Lifestyle changes include losing weight, eating a healthy, low-sodium diet, exercising more, and limiting alcohol. This information is not intended to replace advice given to you by your health care provider. Make sure you discuss any questions you have with your health care provider. Document Released: 04/21/2005 Document Revised: 12/30/2017 Document Reviewed: 12/30/2017 Elsevier Patient Education  2020 Elsevier Inc.      Agustina Caroli, MD [facility]

## 2019-04-18 NOTE — Assessment & Plan Note (Signed)
Systolic blood pressure still elevated.  Will increase lisinopril to 40 mg daily and continue amlodipine at 5 mg daily. Diabetes still uncontrolled.  Hemoglobin A1c at 7.4.  Needs to take metformin 500 mg twice a day and start glipizide 5 mg daily. Continue atorvastatin 20 mg daily. Follow-up in 3 months.

## 2019-05-02 ENCOUNTER — Ambulatory Visit: Payer: Self-pay | Admitting: *Deleted

## 2019-05-02 ENCOUNTER — Telehealth: Payer: Self-pay | Admitting: *Deleted

## 2019-05-02 NOTE — Telephone Encounter (Signed)
Called nd spoke with patient who states his BP is hight He states his sister took it this AM and it was 174/83. He is unable to take it now. He states his sister does it for him. Patient states that he has been taking all his medication. He denies chest pain blurred vision weakness.  Care advice read to patient and he request that I speak with his sister.  Call Benjamin Willis.  She states her brother has had low BS and High BP since visit on 04/18/2019.  She states she cut one Blood sugar medication in half because his bs was 70. Never over  100. Plan of care reviewed with sister.  She states that her brother is not taking amlodipine per instruction on discharge. He is taking the increased dose of  Lisinopril only.  She states he does not have the amlodipine. Per protocol patient should have OV Sister transferred to office for scheduling. Will rout note to office.Please check amlodipine Patient needs reorder if he is to continue. Sister did not think he was taking it.  Reason for Disposition . Systolic BP  >= 027 OR Diastolic >= 741  Answer Assessment - Initial Assessment Questions 1. BLOOD PRESSURE: "What is the blood pressure?" "Did you take at least two measurements 5 minutes apart?"     174/83,  2. ONSET: "When did you take your blood pressure?"   This AM 3. HOW: "How did you obtain the blood pressure?" (e.g., visiting nurse, automatic home BP monitor)     home machine 4. HISTORY: "Do you have a history of high blood pressure?"     htn 5. MEDICATIONS: "Are you taking any medications for blood pressure?" "Have you missed any doses recently?"    No 6. OTHER SYMPTOMS: "Do you have any symptoms?" (e.g., headache, chest pain, blurred vision, difficulty breathing, weakness)    none 7. PREGNANCY: "Is there any chance you are pregnant?" "When was your last menstrual period?"    N/A  Protocols used: HIGH BLOOD PRESSURE-A-AH

## 2019-05-02 NOTE — Telephone Encounter (Signed)
Faxed Rx for Metformin with correct Sig  Tale 1 tablet 500 mg by mouth 2 times  Daily with meal to [facility] at [place]. Confirmation page at 5:27 pm.

## 2019-05-02 NOTE — Telephone Encounter (Signed)
fyi

## 2019-05-02 NOTE — Telephone Encounter (Signed)
Okay; thanks.

## 2019-05-03 ENCOUNTER — Telehealth: Payer: Medicare Other | Admitting: Emergency Medicine

## 2019-05-03 ENCOUNTER — Telehealth: Payer: Self-pay | Admitting: *Deleted

## 2019-05-03 ENCOUNTER — Other Ambulatory Visit: Payer: Self-pay

## 2019-05-03 ENCOUNTER — Encounter: Payer: Self-pay | Admitting: Emergency Medicine

## 2019-05-03 ENCOUNTER — Telehealth (INDEPENDENT_AMBULATORY_CARE_PROVIDER_SITE_OTHER): Payer: Medicare Other | Admitting: Emergency Medicine

## 2019-05-03 DIAGNOSIS — I10 Essential (primary) hypertension: Secondary | ICD-10-CM | POA: Diagnosis not present

## 2019-05-03 DIAGNOSIS — E1159 Type 2 diabetes mellitus with other circulatory complications: Secondary | ICD-10-CM

## 2019-05-03 MED ORDER — AMLODIPINE BESYLATE 5 MG PO TABS: 5.0000 mg | ORAL_TABLET | Freq: Every day | ORAL | 3 refills | Status: DC

## 2019-05-03 NOTE — Progress Notes (Signed)
High Blood pressure. Pt stats his blood pressure this morning was 170. Pt stats he doesn't remember the bottom number. Pt feels fine per pt. Pt stats he is taking his blood pressure medication as directed. descussed need for ophthalmology exam pt declined. Went threw medication list pt stats their is no changes in his medication he is take them all as prescribed states the pt.

## 2019-05-03 NOTE — Progress Notes (Signed)
Telemedicine Encounter- SOAP NOTE Established Patient  This telephone encounter was conducted with the patient's (or proxy's) verbal consent via audio telecommunications: yes/no: Yes Patient was instructed to have this encounter in a suitably private space; and to only have persons present to whom they give permission to participate. In addition, patient identity was confirmed by use of name plus two identifiers (DOB and address).  I discussed the limitations, risks, security and privacy concerns of performing an evaluation and management service by telephone and the availability of in person appointments. I also discussed with the patient that there may be a patient responsible charge related to this service. The patient expressed understanding and agreed to proceed.  I spent a total of TIME; 0 MIN TO 60 MIN: 15 minutes talking with the patient or their proxy.  No chief complaint on file.   Subjective   Benjamin Willis. is a 73 y.o. male established patient. Telephone visit today for hypertension and diabetes issues. Seen by me on 04/18/2019.  Spoke to sister who is authorized to discuss patient's medical problems. Advised to increase lisinopril to 40 mg a day and start amlodipine 5 mg daily.  Blood pressure at home today 161/76. For his diabetes he was advised to increase Metformin to 500 mg twice a day and start glipizide 5 mg daily.  Blood glucose at home between 115 and 145, better than before.  Lowest glucose was 71. No complaints, just wanted to follow-up on blood pressure and diabetes.  HPI   Patient Active Problem List   Diagnosis Date Noted  . Primary cancer of right upper lobe of lung ([place]) 02/07/2019  . Lung nodule 02/02/2019  . Hypertension associated with type 2 diabetes mellitus ([place]) 05/24/2018  . Dyslipidemia 05/24/2018  . Dyslipidemia associated with type 2 diabetes mellitus ([place]) 06/09/2017    Past Medical History:  Diagnosis Date  . Allergy   . Diabetes  mellitus without complication ([place])    Type II  . Hyperlipidemia   . Hypertension     Current Outpatient Medications  Medication Sig Dispense Refill  . albuterol (VENTOLIN HFA) 108 (90 Base) MCG/ACT inhaler Inhale 2 puffs into the lungs every 6 (six) hours as needed for wheezing or shortness of breath. 18 g 6  . amLODipine (NORVASC) 5 MG tablet Take 1 tablet (5 mg total) by mouth daily. 90 tablet 3  . atorvastatin (LIPITOR) 20 MG tablet Take 1 tablet (20 mg total) by mouth daily. 90 tablet 3  . blood glucose meter kit and supplies Per insurance preference. Check blood glucose once a day. Dx E11.9 1 each 3  . Blood Pressure Monitoring (BLOOD PRESSURE MONITOR/ARM) DEVI Check BP once a day. DX I 10 1 each 0  . cetirizine (ZYRTEC) 10 MG tablet Take 1 tablet (10 mg total) by mouth daily. (Patient not taking: Reported on 04/04/2019) 30 tablet 11  . glipiZIDE (GLUCOTROL) 5 MG tablet Take 1 tablet (5 mg total) by mouth daily before breakfast. 90 tablet 1  . lisinopril (ZESTRIL) 40 MG tablet Take 1 tablet (40 mg total) by mouth daily. 90 tablet 3  . metFORMIN (GLUCOPHAGE) 500 MG tablet Take 1 tablet (500 mg total) by mouth 2 (two) times daily with a meal. Take 1 tablet by mouth once daily with breakfast 180 tablet 1  . Multiple Vitamins-Minerals (MULTIVITAMIN MEN) TABS Take 1 tablet by mouth daily.    Marland Kitchen umeclidinium-vilanterol (ANORO ELLIPTA) 62.5-25 MCG/INH AEPB Inhale 1 puff into the lungs daily. 30 each  5   No current facility-administered medications for this visit.    No Known Allergies  Social History   Socioeconomic History  . Marital status: Single    Spouse name: Not on file  . Number of children: 0  . Years of education: Not on file  . Highest education level: 9th grade  Occupational History  . Not on file  Tobacco Use  . Smoking status: Former Smoker    Packs/day: 1.00    Years: 48.00    Pack years: 48.00    Quit date: 02/20/2015    Years since quitting: 4.2  . Smokeless  tobacco: Former Systems developer    Quit date: 10/2013  Substance and Sexual Activity  . Alcohol use: No  . Drug use: No  . Sexual activity: Yes    Birth control/protection: Condom  Other Topics Concern  . Not on file  Social History Narrative  . Not on file   Social Determinants of Health   Financial Resource Strain:   . Difficulty of Paying Living Expenses: Not on file  Food Insecurity:   . Worried About Charity fundraiser in the Last Year: Not on file  . Ran Out of Food in the Last Year: Not on file  Transportation Needs:   . Lack of Transportation (Medical): Not on file  . Lack of Transportation (Non-Medical): Not on file  Physical Activity:   . Days of Exercise per Week: Not on file  . Minutes of Exercise per Session: Not on file  Stress:   . Feeling of Stress : Not on file  Social Connections:   . Frequency of Communication with Friends and Family: Not on file  . Frequency of Social Gatherings with Friends and Family: Not on file  . Attends Religious Services: Not on file  . Active Member of Clubs or Organizations: Not on file  . Attends Archivist Meetings: Not on file  . Marital Status: Not on file  Intimate Partner Violence:   . Fear of Current or Ex-Partner: Not on file  . Emotionally Abused: Not on file  . Physically Abused: Not on file  . Sexually Abused: Not on file    Review of Systems  Constitutional: Negative.  Negative for chills and fever.  HENT: Negative.  Negative for congestion and sore throat.   Respiratory: Negative.  Negative for cough and shortness of breath.   Cardiovascular: Negative.  Negative for chest pain and palpitations.  Gastrointestinal: Negative.  Negative for abdominal pain, blood in stool, diarrhea, melena, nausea and vomiting.  Genitourinary: Negative.  Negative for dysuria and hematuria.  Musculoskeletal: Negative.  Negative for myalgias.  Skin: Negative.  Negative for rash.  Neurological: Negative.  Negative for dizziness and  headaches.  Endo/Heme/Allergies: Negative.   All other systems reviewed and are negative.   Objective  Alert and oriented x3 in no apparent respiratory distress Vitals as reported by the patient: There were no vitals filed for this visit.  Diagnoses and all orders for this visit:  Hypertension associated with type 2 diabetes mellitus (HCC) -     amLODipine (NORVASC) 5 MG tablet; Take 1 tablet (5 mg total) by mouth daily.  Clinically stable.  No medical concerns identified during this visit.  Continue present medications.  No changes. Follow-up in the office as scheduled next month.   I discussed the assessment and treatment plan with the patient. The patient was provided an opportunity to ask questions and all were answered. The patient agreed with  the plan and demonstrated an understanding of the instructions.   The patient was advised to call back or seek an in-person evaluation if the symptoms worsen or if the condition fails to improve as anticipated.  I provided 15 minutes of non-face-to-face time during this encounter.  Horald Pollen, MD  Primary [facility] at [facility]

## 2019-05-03 NOTE — Telephone Encounter (Signed)
Called patient left message in mobil voice mail to return call to be triage before 9:20 am.

## 2019-05-27 ENCOUNTER — Ambulatory Visit: Payer: Medicare Other | Admitting: Emergency Medicine

## 2019-05-30 ENCOUNTER — Encounter: Payer: Self-pay | Admitting: Emergency Medicine

## 2019-05-30 ENCOUNTER — Other Ambulatory Visit: Payer: Self-pay

## 2019-05-30 ENCOUNTER — Ambulatory Visit (INDEPENDENT_AMBULATORY_CARE_PROVIDER_SITE_OTHER): Payer: Medicare Other | Admitting: Emergency Medicine

## 2019-05-30 VITALS — BP 140/77 | HR 62 | Temp 98.2°F | Wt 164.8 lb

## 2019-05-30 DIAGNOSIS — E1159 Type 2 diabetes mellitus with other circulatory complications: Secondary | ICD-10-CM

## 2019-05-30 DIAGNOSIS — I10 Essential (primary) hypertension: Secondary | ICD-10-CM | POA: Diagnosis not present

## 2019-05-30 DIAGNOSIS — Z1211 Encounter for screening for malignant neoplasm of colon: Secondary | ICD-10-CM

## 2019-05-30 DIAGNOSIS — C3411 Malignant neoplasm of upper lobe, right bronchus or lung: Secondary | ICD-10-CM | POA: Diagnosis not present

## 2019-05-30 DIAGNOSIS — Z135 Encounter for screening for eye and ear disorders: Secondary | ICD-10-CM | POA: Diagnosis not present

## 2019-05-30 DIAGNOSIS — E1169 Type 2 diabetes mellitus with other specified complication: Secondary | ICD-10-CM

## 2019-05-30 DIAGNOSIS — E785 Hyperlipidemia, unspecified: Secondary | ICD-10-CM

## 2019-05-30 DIAGNOSIS — I152 Hypertension secondary to endocrine disorders: Secondary | ICD-10-CM

## 2019-05-30 LAB — POCT GLYCOSYLATED HEMOGLOBIN (HGB A1C): Hemoglobin A1C: 7.7 % — AB (ref 4.0–5.6)

## 2019-05-30 LAB — GLUCOSE, POCT (MANUAL RESULT ENTRY): POC Glucose: 148 mg/dl — AB (ref 70–99)

## 2019-05-30 NOTE — Assessment & Plan Note (Addendum)
Systolic blood pressure elevated.  Will increase amlodipine to 10 mg daily continue lisinopril 40 mg daily. Uncontrolled diabetes with hemoglobin A1c at 7.7.  Patient not taking medications as initially prescribed.  Continue metformin 500 mg twice a day and start glipizide 5 mg with breakfast.  Diet and nutrition discussed. Follow-up in 3 months.

## 2019-05-30 NOTE — Progress Notes (Signed)
Benjamin Willis. 74 y.o.   Chief Complaint  Patient presents with  . Medical Management of Chronic Issues    6 month f/u on diabetes and HTN. Patient stated his bo has been elevated average 188/78-133/70    HISTORY OF PRESENT ILLNESS: This is a 74 y.o. male with history of multiple medical problems here for follow-up: 1.  Hypertension: On amlodipine 5 mg daily and lisinopril 40 mg daily.  Systolic blood pressure readings at home elevated.  Will increase amlodipine to 10 mg daily and continue lisinopril. 2.  Type 2 diabetes: On Metformin 500 mg twice a day.  Was prescribed glipizide 5 mg daily but is not taking it out of a misunderstanding.  Glucose reading at home was slightly elevated. 3.  Dyslipidemia: On Lipitor 20 mg daily.  HPI   Prior to Admission medications   Medication Sig Start Date End Date Taking? Authorizing Provider  albuterol (VENTOLIN HFA) 108 (90 Base) MCG/ACT inhaler Inhale 2 puffs into the lungs every 6 (six) hours as needed for wheezing or shortness of breath. 01/19/19  Yes Icard, Bradley L, DO  amLODipine (NORVASC) 5 MG tablet Take 1 tablet (5 mg total) by mouth daily. 05/03/19 08/01/19 Yes Amora Sheehy, Ines Bloomer, MD  atorvastatin (LIPITOR) 20 MG tablet Take 1 tablet (20 mg total) by mouth daily. 05/24/18  Yes Jaiquan Temme, Ines Bloomer, MD  blood glucose meter kit and supplies Per insurance preference. Check blood glucose once a day. Dx E11.9 04/18/19  Yes Horald Pollen, MD  Blood Pressure Monitoring (BLOOD PRESSURE MONITOR/ARM) DEVI Check BP once a day. DX I 10 04/18/19  Yes Octavious Zidek, Ines Bloomer, MD  cetirizine (ZYRTEC) 10 MG tablet Take 1 tablet (10 mg total) by mouth daily. 09/03/17  Yes Jaynee Eagles, PA-C  glipiZIDE (GLUCOTROL) 5 MG tablet Take 1 tablet (5 mg total) by mouth daily before breakfast. 04/18/19 07/17/19 Yes Taneeka Curtner, Ines Bloomer, MD  lisinopril (ZESTRIL) 40 MG tablet Take 1 tablet (40 mg total) by mouth daily. 04/18/19 07/17/19 Yes Xaniyah Buchholz, Ines Bloomer,  MD  metFORMIN (GLUCOPHAGE) 500 MG tablet Take 1 tablet (500 mg total) by mouth 2 (two) times daily with a meal. Take 1 tablet by mouth once daily with breakfast 04/18/19 07/17/19 Yes Valecia Beske, Ines Bloomer, MD  Multiple Vitamins-Minerals (MULTIVITAMIN MEN) TABS Take 1 tablet by mouth daily.   Yes [provider]  umeclidinium-vilanterol (ANORO ELLIPTA) 62.5-25 MCG/INH AEPB Inhale 1 puff into the lungs daily. 03/03/19 06/01/19 Yes Icard, Octavio Graves, DO    No Known Allergies  Patient Active Problem List   Diagnosis Date Noted  . Primary cancer of right upper lobe of lung ([place]) 02/07/2019  . Lung nodule 02/02/2019  . Hypertension associated with type 2 diabetes mellitus ([place]) 05/24/2018  . Dyslipidemia 05/24/2018  . Dyslipidemia associated with type 2 diabetes mellitus ([place]) 06/09/2017    Past Medical History:  Diagnosis Date  . Allergy   . Diabetes mellitus without complication ([place])    Type II  . Hyperlipidemia   . Hypertension     Past Surgical History:  Procedure Laterality Date  . ELECTROMAGNETIC NAVIGATION BROCHOSCOPY Right 02/02/2019   Procedure: VIDEO BRONCHOSCOPY WITH NAVIGATION AND FIDUCIAL PLACEMENT;  Surgeon: Garner Nash, DO;  Location: [facility];  Service: Cardiopulmonary;  Laterality: Right;  . FUDUCIAL PLACEMENT Right 02/02/2019   Procedure: Placement Of Fuducial Right Upper Lobe;  Surgeon: Garner Nash, DO;  Location: [facility];  Service: Cardiopulmonary;  Laterality: Right;  . none    . VIDEO  BRONCHOSCOPY WITH RADIAL ENDOBRONCHIAL ULTRASOUND Right 02/02/2019   Procedure: Video Bronchoscopy With Radial Endobronchial Ultrasound;  Surgeon: Garner Nash, DO;  Location: [facility];  Service: Cardiopulmonary;  Laterality: Right;    Social History   Socioeconomic History  . Marital status: Single    Spouse name: Not on file  . Number of children: 0  . Years of education: Not on file  . Highest education level: 9th grade  Occupational History  . Not on file    Tobacco Use  . Smoking status: Former Smoker    Packs/day: 1.00    Years: 48.00    Pack years: 48.00    Quit date: 02/20/2015    Years since quitting: 4.2  . Smokeless tobacco: Former Systems developer    Quit date: 10/2013  Substance and Sexual Activity  . Alcohol use: No  . Drug use: No  . Sexual activity: Yes    Birth control/protection: Condom  Other Topics Concern  . Not on file  Social History Narrative  . Not on file   Social Determinants of Health   Financial Resource Strain:   . Difficulty of Paying Living Expenses: Not on file  Food Insecurity:   . Worried About Charity fundraiser in the Last Year: Not on file  . Ran Out of Food in the Last Year: Not on file  Transportation Needs:   . Lack of Transportation (Medical): Not on file  . Lack of Transportation (Non-Medical): Not on file  Physical Activity:   . Days of Exercise per Week: Not on file  . Minutes of Exercise per Session: Not on file  Stress:   . Feeling of Stress : Not on file  Social Connections:   . Frequency of Communication with Friends and Family: Not on file  . Frequency of Social Gatherings with Friends and Family: Not on file  . Attends Religious Services: Not on file  . Active Member of Clubs or Organizations: Not on file  . Attends Archivist Meetings: Not on file  . Marital Status: Not on file  Intimate Partner Violence:   . Fear of Current or Ex-Partner: Not on file  . Emotionally Abused: Not on file  . Physically Abused: Not on file  . Sexually Abused: Not on file    Family History  Problem Relation Age of Onset  . Lung cancer Neg Hx      Review of Systems  Constitutional: Negative.  Negative for chills and fever.  HENT: Negative.  Negative for congestion and sore throat.   Eyes: Negative.  Negative for blurred vision.  Respiratory: Negative.  Negative for cough and shortness of breath.   Cardiovascular: Negative.  Negative for chest pain and palpitations.  Gastrointestinal:  Negative.  Negative for abdominal pain, blood in stool, diarrhea, melena, nausea and vomiting.  Genitourinary: Negative.  Negative for dysuria and hematuria.  Musculoskeletal: Negative.  Negative for back pain, myalgias and neck pain.  Skin: Negative.  Negative for rash.  Neurological: Negative.  Negative for dizziness and headaches.  All other systems reviewed and are negative.  Today's Vitals   05/30/19 0943  BP: 140/77  Pulse: 62  Temp: 98.2 F (36.8 C)  TempSrc: Temporal  SpO2: 99%  Weight: 164 lb 12.8 oz (74.8 kg)   Body mass index is 26.2 kg/m.   Physical Exam Vitals reviewed.  Constitutional:      Appearance: Normal appearance.  HENT:     Head: Normocephalic.  Eyes:  Extraocular Movements: Extraocular movements intact.     Conjunctiva/sclera: Conjunctivae normal.     Pupils: Pupils are equal, round, and reactive to light.  Cardiovascular:     Rate and Rhythm: Normal rate and regular rhythm.     Pulses: Normal pulses.     Heart sounds: Normal heart sounds.  Pulmonary:     Effort: Pulmonary effort is normal.     Breath sounds: Normal breath sounds.  Musculoskeletal:        General: Normal range of motion.     Cervical back: Normal range of motion and neck supple.     Right lower leg: No edema.     Left lower leg: No edema.  Skin:    General: Skin is warm and dry.     Capillary Refill: Capillary refill takes less than 2 seconds.  Neurological:     General: No focal deficit present.     Mental Status: He is alert and oriented to person, place, and time.  Psychiatric:        Mood and Affect: Mood normal.        Behavior: Behavior normal.    Results for orders placed or performed in visit on 05/30/19 (from the past 24 hour(s))  POCT glucose (manual entry)     Status: Abnormal   Collection Time: 05/30/19 10:45 AM  Result Value Ref Range   POC Glucose 148 (A) 70 - 99 mg/dl  POCT glycosylated hemoglobin (Hb A1C)     Status: Abnormal   Collection Time:  05/30/19 10:56 AM  Result Value Ref Range   Hemoglobin A1C 7.7 (A) 4.0 - 5.6 %   HbA1c POC (<> result, manual entry)     HbA1c, POC (prediabetic range)     HbA1c, POC (controlled diabetic range)     A total of 30 minutes was spent with the patient, greater than 50% of which was in counseling/coordination of care regarding diabetes and hypertension, cardiovascular risks associated with these conditions, review of all medications and dose changes, review of most recent blood work, review of home data, diet and nutrition, review of most recent office visit notes, prognosis and need for follow-up.     ASSESSMENT & PLAN: Hypertension associated with type 2 diabetes mellitus (HCC) Systolic blood pressure elevated.  Will increase amlodipine to 10 mg daily continue lisinopril 40 mg daily. Uncontrolled diabetes with hemoglobin A1c at 7.7.  Patient not taking medications as initially prescribed.  Continue metformin 500 mg twice a day and start glipizide 5 mg with breakfast.  Diet and nutrition discussed. Follow-up in 3 months.  Benjamin Willis was seen today for medical management of chronic issues.  Diagnoses and all orders for this visit:  Hypertension associated with type 2 diabetes mellitus ([place]) -     Comprehensive metabolic panel -     CBC with Differential -     POCT glucose (manual entry) -     POCT glycosylated hemoglobin (Hb A1C)  Dyslipidemia associated with type 2 diabetes mellitus (HCC) -     Lipid Panel  Primary cancer of right upper lobe of lung ([place])  Screening for diabetic retinopathy -     Ambulatory referral to Ophthalmology  Colon cancer screening -     Ambulatory referral to Gastroenterology    Patient Instructions   Increase amlodipine to 10 mg a day. Start taking glipizide 5 mg daily. Continue all other medications. Diabetes Mellitus and Nutrition, Adult When you have diabetes (diabetes mellitus), it is very important to have  healthy eating habits because your blood  sugar (glucose) levels are greatly affected by what you eat and drink. Eating healthy foods in the appropriate amounts, at about the same times every day, can help you:  Control your blood glucose.  Lower your risk of heart disease.  Improve your blood pressure.  Reach or maintain a healthy weight. Every person with diabetes is different, and each person has different needs for a meal plan. Your health care provider may recommend that you work with a diet and nutrition specialist (dietitian) to make a meal plan that is best for you. Your meal plan may vary depending on factors such as:  The calories you need.  The medicines you take.  Your weight.  Your blood glucose, blood pressure, and cholesterol levels.  Your activity level.  Other health conditions you have, such as heart or kidney disease. How do carbohydrates affect me? Carbohydrates, also called carbs, affect your blood glucose level more than any other type of food. Eating carbs naturally raises the amount of glucose in your blood. Carb counting is a method for keeping track of how many carbs you eat. Counting carbs is important to keep your blood glucose at a healthy level, especially if you use insulin or take certain oral diabetes medicines. It is important to know how many carbs you can safely have in each meal. This is different for every person. Your dietitian can help you calculate how many carbs you should have at each meal and for each snack. Foods that contain carbs include:  Bread, cereal, rice, pasta, and crackers.  Potatoes and corn.  Peas, beans, and lentils.  Milk and yogurt.  Fruit and juice.  Desserts, such as cakes, cookies, ice cream, and candy. How does alcohol affect me? Alcohol can cause a sudden decrease in blood glucose (hypoglycemia), especially if you use insulin or take certain oral diabetes medicines. Hypoglycemia can be a life-threatening condition. Symptoms of hypoglycemia (sleepiness,  dizziness, and confusion) are similar to symptoms of having too much alcohol. If your health care provider says that alcohol is safe for you, follow these guidelines:  Limit alcohol intake to no more than 1 drink per day for nonpregnant women and 2 drinks per day for men. One drink equals 12 oz of beer, 5 oz of wine, or 1 oz of hard liquor.  Do not drink on an empty stomach.  Keep yourself hydrated with water, diet soda, or unsweetened iced tea.  Keep in mind that regular soda, juice, and other mixers may contain a lot of sugar and must be counted as carbs. What are tips for following this plan?  Reading food labels  Start by checking the serving size on the "Nutrition Facts" label of packaged foods and drinks. The amount of calories, carbs, fats, and other nutrients listed on the label is based on one serving of the item. Many items contain more than one serving per package.  Check the total grams (g) of carbs in one serving. You can calculate the number of servings of carbs in one serving by dividing the total carbs by 15. For example, if a food has 30 g of total carbs, it would be equal to 2 servings of carbs.  Check the number of grams (g) of saturated and trans fats in one serving. Choose foods that have low or no amount of these fats.  Check the number of milligrams (mg) of salt (sodium) in one serving. Most people should limit total sodium intake to  less than 2,300 mg per day.  Always check the nutrition information of foods labeled as "low-fat" or "nonfat". These foods may be higher in added sugar or refined carbs and should be avoided.  Talk to your dietitian to identify your daily goals for nutrients listed on the label. Shopping  Avoid buying canned, premade, or processed foods. These foods tend to be high in fat, sodium, and added sugar.  Shop around the outside edge of the grocery store. This includes fresh fruits and vegetables, bulk grains, fresh meats, and fresh  dairy. Cooking  Use low-heat cooking methods, such as baking, instead of high-heat cooking methods like deep frying.  Cook using healthy oils, such as olive, canola, or sunflower oil.  Avoid cooking with butter, cream, or high-fat meats. Meal planning  Eat meals and snacks regularly, preferably at the same times every day. Avoid going long periods of time without eating.  Eat foods high in fiber, such as fresh fruits, vegetables, beans, and whole grains. Talk to your dietitian about how many servings of carbs you can eat at each meal.  Eat 4-6 ounces (oz) of lean protein each day, such as lean meat, chicken, fish, eggs, or tofu. One oz of lean protein is equal to: ? 1 oz of meat, chicken, or fish. ? 1 egg. ?  cup of tofu.  Eat some foods each day that contain healthy fats, such as avocado, nuts, seeds, and fish. Lifestyle  Check your blood glucose regularly.  Exercise regularly as told by your health care provider. This may include: ? 150 minutes of moderate-intensity or vigorous-intensity exercise each week. This could be brisk walking, biking, or water aerobics. ? Stretching and doing strength exercises, such as yoga or weightlifting, at least 2 times a week.  Take medicines as told by your health care provider.  Do not use any products that contain nicotine or tobacco, such as cigarettes and e-cigarettes. If you need help quitting, ask your health care provider.  Work with a Social worker or diabetes educator to identify strategies to manage stress and any emotional and social challenges. Questions to ask a health care provider  Do I need to meet with a diabetes educator?  Do I need to meet with a dietitian?  What number can I call if I have questions?  When are the best times to check my blood glucose? Where to find more information:  American Diabetes Association: diabetes.org  Academy of Nutrition and Dietetics: www.eatright.[employer] of Diabetes and  Digestive and Kidney Diseases (NIH): [employer] Summary  A healthy meal plan will help you control your blood glucose and maintain a healthy lifestyle.  Working with a diet and nutrition specialist (dietitian) can help you make a meal plan that is best for you.  Keep in mind that carbohydrates (carbs) and alcohol have immediate effects on your blood glucose levels. It is important to count carbs and to use alcohol carefully. This information is not intended to replace advice given to you by your health care provider. Make sure you discuss any questions you have with your health care provider. Document Revised: 04/03/2017 Document Reviewed: 05/26/2016 Elsevier Patient Education  [place].  Hypertension, Adult High blood pressure (hypertension) is when the force of blood pumping through the arteries is too strong. The arteries are the blood vessels that carry blood from the heart throughout the body. Hypertension forces the heart to work harder to pump blood and may cause arteries to become narrow or stiff. Untreated  or uncontrolled hypertension can cause a heart attack, heart failure, a stroke, kidney disease, and other problems. A blood pressure reading consists of a higher number over a lower number. Ideally, your blood pressure should be below 120/80. The first ("top") number is called the systolic pressure. It is a measure of the pressure in your arteries as your heart beats. The second ("bottom") number is called the diastolic pressure. It is a measure of the pressure in your arteries as the heart relaxes. What are the causes? The exact cause of this condition is not known. There are some conditions that result in or are related to high blood pressure. What increases the risk? Some risk factors for high blood pressure are under your control. The following factors may make you more likely to develop this condition:  Smoking.  Having type 2 diabetes mellitus, high  cholesterol, or both.  Not getting enough exercise or physical activity.  Being overweight.  Having too much fat, sugar, calories, or salt (sodium) in your diet.  Drinking too much alcohol. Some risk factors for high blood pressure may be difficult or impossible to change. Some of these factors include:  Having chronic kidney disease.  Having a family history of high blood pressure.  Age. Risk increases with age.  Race. You may be at higher risk if you are African American.  Gender. Men are at higher risk than women before age 92. After age 43, women are at higher risk than men.  Having obstructive sleep apnea.  Stress. What are the signs or symptoms? High blood pressure may not cause symptoms. Very high blood pressure (hypertensive crisis) may cause:  Headache.  Anxiety.  Shortness of breath.  Nosebleed.  Nausea and vomiting.  Vision changes.  Severe chest pain.  Seizures. How is this diagnosed? This condition is diagnosed by measuring your blood pressure while you are seated, with your arm resting on a flat surface, your legs uncrossed, and your feet flat on the floor. The cuff of the blood pressure monitor will be placed directly against the skin of your upper arm at the level of your heart. It should be measured at least twice using the same arm. Certain conditions can cause a difference in blood pressure between your right and left arms. Certain factors can cause blood pressure readings to be lower or higher than normal for a short period of time:  When your blood pressure is higher when you are in a health care provider's office than when you are at home, this is called white coat hypertension. Most people with this condition do not need medicines.  When your blood pressure is higher at home than when you are in a health care provider's office, this is called masked hypertension. Most people with this condition may need medicines to control blood pressure. If  you have a high blood pressure reading during one visit or you have normal blood pressure with other risk factors, you may be asked to:  Return on a different day to have your blood pressure checked again.  Monitor your blood pressure at home for 1 week or longer. If you are diagnosed with hypertension, you may have other blood or imaging tests to help your health care provider understand your overall risk for other conditions. How is this treated? This condition is treated by making healthy lifestyle changes, such as eating healthy foods, exercising more, and reducing your alcohol intake. Your health care provider may prescribe medicine if lifestyle changes are not enough  to get your blood pressure under control, and if:  Your systolic blood pressure is above 130.  Your diastolic blood pressure is above 80. Your personal target blood pressure may vary depending on your medical conditions, your age, and other factors. Follow these instructions at home: Eating and drinking   Eat a diet that is high in fiber and potassium, and low in sodium, added sugar, and fat. An example eating plan is called the DASH (Dietary Approaches to Stop Hypertension) diet. To eat this way: ? Eat plenty of fresh fruits and vegetables. Try to fill one half of your plate at each meal with fruits and vegetables. ? Eat whole grains, such as whole-wheat pasta, brown rice, or whole-grain bread. Fill about one fourth of your plate with whole grains. ? Eat or drink low-fat dairy products, such as skim milk or low-fat yogurt. ? Avoid fatty cuts of meat, processed or cured meats, and poultry with skin. Fill about one fourth of your plate with lean proteins, such as fish, chicken without skin, beans, eggs, or tofu. ? Avoid pre-made and processed foods. These tend to be higher in sodium, added sugar, and fat.  Reduce your daily sodium intake. Most people with hypertension should eat less than 1,500 mg of sodium a day.  Do not  drink alcohol if: ? Your health care provider tells you not to drink. ? You are pregnant, may be pregnant, or are planning to become pregnant.  If you drink alcohol: ? Limit how much you use to:  0-1 drink a day for women.  0-2 drinks a day for men. ? Be aware of how much alcohol is in your drink. In the [place] one drink equals one 12 oz bottle of beer (355 mL), one 5 oz glass of wine (148 mL), or one 1 oz glass of hard liquor (44 mL). Lifestyle   Work with your health care provider to maintain a healthy body weight or to lose weight. Ask what an ideal weight is for you.  Get at least 30 minutes of exercise most days of the week. Activities may include walking, swimming, or biking.  Include exercise to strengthen your muscles (resistance exercise), such as Pilates or lifting weights, as part of your weekly exercise routine. Try to do these types of exercises for 30 minutes at least 3 days a week.  Do not use any products that contain nicotine or tobacco, such as cigarettes, e-cigarettes, and chewing tobacco. If you need help quitting, ask your health care provider.  Monitor your blood pressure at home as told by your health care provider.  Keep all follow-up visits as told by your health care provider. This is important. Medicines  Take over-the-counter and prescription medicines only as told by your health care provider. Follow directions carefully. Blood pressure medicines must be taken as prescribed.  Do not skip doses of blood pressure medicine. Doing this puts you at risk for problems and can make the medicine less effective.  Ask your health care provider about side effects or reactions to medicines that you should watch for. Contact a health care provider if you:  Think you are having a reaction to a medicine you are taking.  Have headaches that keep coming back (recurring).  Feel dizzy.  Have swelling in your ankles.  Have trouble with your vision. Get help right  away if you:  Develop a severe headache or confusion.  Have unusual weakness or numbness.  Feel faint.  Have severe pain in your  chest or abdomen.  Vomit repeatedly.  Have trouble breathing. Summary  Hypertension is when the force of blood pumping through your arteries is too strong. If this condition is not controlled, it may put you at risk for serious complications.  Your personal target blood pressure may vary depending on your medical conditions, your age, and other factors. For most people, a normal blood pressure is less than 120/80.  Hypertension is treated with lifestyle changes, medicines, or a combination of both. Lifestyle changes include losing weight, eating a healthy, low-sodium diet, exercising more, and limiting alcohol. This information is not intended to replace advice given to you by your health care provider. Make sure you discuss any questions you have with your health care provider. Document Revised: 12/30/2017 Document Reviewed: 12/30/2017 Elsevier Patient Education  [place] [employer].     If you have lab work done today you will be contacted with your lab results within the next 2 weeks.  If you have not heard from [place] then please contact us. The fastest way to get your results is to register for My Chart.   IF you received an x-ray today, you will receive an invoice from [facility]. Please contact [facility] Radiology at [phone] with questions or concerns regarding your invoice.   IF you received labwork today, you will receive an invoice from [facility]. Please contact [facility] at [phone] with questions or concerns regarding your invoice.   Our billing staff will not be able to assist you with questions regarding bills from these companies.  You will be contacted with the lab results as soon as they are available. The fastest way to get your results is to activate your My Chart account. Instructions are located on the last page of this  paperwork. If you have not heard from [place] regarding the results in 2 weeks, please contact this office.          Agustina Caroli, MD [facility]

## 2019-05-30 NOTE — Patient Instructions (Addendum)
Increase amlodipine to 10 mg a day. Start taking glipizide 5 mg daily. Continue all other medications. Diabetes Mellitus and Nutrition, Adult When you have diabetes (diabetes mellitus), it is very important to have healthy eating habits because your blood sugar (glucose) levels are greatly affected by what you eat and drink. Eating healthy foods in the appropriate amounts, at about the same times every day, can help you:  Control your blood glucose.  Lower your risk of heart disease.  Improve your blood pressure.  Reach or maintain a healthy weight. Every person with diabetes is different, and each person has different needs for a meal plan. Your health care provider may recommend that you work with a diet and nutrition specialist (dietitian) to make a meal plan that is best for you. Your meal plan may vary depending on factors such as:  The calories you need.  The medicines you take.  Your weight.  Your blood glucose, blood pressure, and cholesterol levels.  Your activity level.  Other health conditions you have, such as heart or kidney disease. How do carbohydrates affect me? Carbohydrates, also called carbs, affect your blood glucose level more than any other type of food. Eating carbs naturally raises the amount of glucose in your blood. Carb counting is a method for keeping track of how many carbs you eat. Counting carbs is important to keep your blood glucose at a healthy level, especially if you use insulin or take certain oral diabetes medicines. It is important to know how many carbs you can safely have in each meal. This is different for every person. Your dietitian can help you calculate how many carbs you should have at each meal and for each snack. Foods that contain carbs include:  Bread, cereal, rice, pasta, and crackers.  Potatoes and corn.  Peas, beans, and lentils.  Milk and yogurt.  Fruit and juice.  Desserts, such as cakes, cookies, ice cream, and  candy. How does alcohol affect me? Alcohol can cause a sudden decrease in blood glucose (hypoglycemia), especially if you use insulin or take certain oral diabetes medicines. Hypoglycemia can be a life-threatening condition. Symptoms of hypoglycemia (sleepiness, dizziness, and confusion) are similar to symptoms of having too much alcohol. If your health care provider says that alcohol is safe for you, follow these guidelines:  Limit alcohol intake to no more than 1 drink per day for nonpregnant women and 2 drinks per day for men. One drink equals 12 oz of beer, 5 oz of wine, or 1 oz of hard liquor.  Do not drink on an empty stomach.  Keep yourself hydrated with water, diet soda, or unsweetened iced tea.  Keep in mind that regular soda, juice, and other mixers may contain a lot of sugar and must be counted as carbs. What are tips for following this plan?  Reading food labels  Start by checking the serving size on the "Nutrition Facts" label of packaged foods and drinks. The amount of calories, carbs, fats, and other nutrients listed on the label is based on one serving of the item. Many items contain more than one serving per package.  Check the total grams (g) of carbs in one serving. You can calculate the number of servings of carbs in one serving by dividing the total carbs by 15. For example, if a food has 30 g of total carbs, it would be equal to 2 servings of carbs.  Check the number of grams (g) of saturated and trans fats in one  serving. Choose foods that have low or no amount of these fats.  Check the number of milligrams (mg) of salt (sodium) in one serving. Most people should limit total sodium intake to less than 2,300 mg per day.  Always check the nutrition information of foods labeled as "low-fat" or "nonfat". These foods may be higher in added sugar or refined carbs and should be avoided.  Talk to your dietitian to identify your daily goals for nutrients listed on the  label. Shopping  Avoid buying canned, premade, or processed foods. These foods tend to be high in fat, sodium, and added sugar.  Shop around the outside edge of the grocery store. This includes fresh fruits and vegetables, bulk grains, fresh meats, and fresh dairy. Cooking  Use low-heat cooking methods, such as baking, instead of high-heat cooking methods like deep frying.  Cook using healthy oils, such as olive, canola, or sunflower oil.  Avoid cooking with butter, cream, or high-fat meats. Meal planning  Eat meals and snacks regularly, preferably at the same times every day. Avoid going long periods of time without eating.  Eat foods high in fiber, such as fresh fruits, vegetables, beans, and whole grains. Talk to your dietitian about how many servings of carbs you can eat at each meal.  Eat 4-6 ounces (oz) of lean protein each day, such as lean meat, chicken, fish, eggs, or tofu. One oz of lean protein is equal to: ? 1 oz of meat, chicken, or fish. ? 1 egg. ?  cup of tofu.  Eat some foods each day that contain healthy fats, such as avocado, nuts, seeds, and fish. Lifestyle  Check your blood glucose regularly.  Exercise regularly as told by your health care provider. This may include: ? 150 minutes of moderate-intensity or vigorous-intensity exercise each week. This could be brisk walking, biking, or water aerobics. ? Stretching and doing strength exercises, such as yoga or weightlifting, at least 2 times a week.  Take medicines as told by your health care provider.  Do not use any products that contain nicotine or tobacco, such as cigarettes and e-cigarettes. If you need help quitting, ask your health care provider.  Work with a Social worker or diabetes educator to identify strategies to manage stress and any emotional and social challenges. Questions to ask a health care provider  Do I need to meet with a diabetes educator?  Do I need to meet with a dietitian?  What  number can I call if I have questions?  When are the best times to check my blood glucose? Where to find more information:  American Diabetes Association: diabetes.org  Academy of Nutrition and Dietetics: www.eatright.[employer] of Diabetes and Digestive and Kidney Diseases (NIH): [employer] Summary  A healthy meal plan will help you control your blood glucose and maintain a healthy lifestyle.  Working with a diet and nutrition specialist (dietitian) can help you make a meal plan that is best for you.  Keep in mind that carbohydrates (carbs) and alcohol have immediate effects on your blood glucose levels. It is important to count carbs and to use alcohol carefully. This information is not intended to replace advice given to you by your health care provider. Make sure you discuss any questions you have with your health care provider. Document Revised: 04/03/2017 Document Reviewed: 05/26/2016 Elsevier Patient Education  [place].  Hypertension, Adult High blood pressure (hypertension) is when the force of blood pumping through the arteries is too strong. The arteries  are the blood vessels that carry blood from the heart throughout the body. Hypertension forces the heart to work harder to pump blood and may cause arteries to become narrow or stiff. Untreated or uncontrolled hypertension can cause a heart attack, heart failure, a stroke, kidney disease, and other problems. A blood pressure reading consists of a higher number over a lower number. Ideally, your blood pressure should be below 120/80. The first ("top") number is called the systolic pressure. It is a measure of the pressure in your arteries as your heart beats. The second ("bottom") number is called the diastolic pressure. It is a measure of the pressure in your arteries as the heart relaxes. What are the causes? The exact cause of this condition is not known. There are some conditions that result in or  are related to high blood pressure. What increases the risk? Some risk factors for high blood pressure are under your control. The following factors may make you more likely to develop this condition:  Smoking.  Having type 2 diabetes mellitus, high cholesterol, or both.  Not getting enough exercise or physical activity.  Being overweight.  Having too much fat, sugar, calories, or salt (sodium) in your diet.  Drinking too much alcohol. Some risk factors for high blood pressure may be difficult or impossible to change. Some of these factors include:  Having chronic kidney disease.  Having a family history of high blood pressure.  Age. Risk increases with age.  Race. You may be at higher risk if you are African American.  Gender. Men are at higher risk than women before age 25. After age 62, women are at higher risk than men.  Having obstructive sleep apnea.  Stress. What are the signs or symptoms? High blood pressure may not cause symptoms. Very high blood pressure (hypertensive crisis) may cause:  Headache.  Anxiety.  Shortness of breath.  Nosebleed.  Nausea and vomiting.  Vision changes.  Severe chest pain.  Seizures. How is this diagnosed? This condition is diagnosed by measuring your blood pressure while you are seated, with your arm resting on a flat surface, your legs uncrossed, and your feet flat on the floor. The cuff of the blood pressure monitor will be placed directly against the skin of your upper arm at the level of your heart. It should be measured at least twice using the same arm. Certain conditions can cause a difference in blood pressure between your right and left arms. Certain factors can cause blood pressure readings to be lower or higher than normal for a short period of time:  When your blood pressure is higher when you are in a health care provider's office than when you are at home, this is called white coat hypertension. Most people with  this condition do not need medicines.  When your blood pressure is higher at home than when you are in a health care provider's office, this is called masked hypertension. Most people with this condition may need medicines to control blood pressure. If you have a high blood pressure reading during one visit or you have normal blood pressure with other risk factors, you may be asked to:  Return on a different day to have your blood pressure checked again.  Monitor your blood pressure at home for 1 week or longer. If you are diagnosed with hypertension, you may have other blood or imaging tests to help your health care provider understand your overall risk for other conditions. How is this treated? This  condition is treated by making healthy lifestyle changes, such as eating healthy foods, exercising more, and reducing your alcohol intake. Your health care provider may prescribe medicine if lifestyle changes are not enough to get your blood pressure under control, and if:  Your systolic blood pressure is above 130.  Your diastolic blood pressure is above 80. Your personal target blood pressure may vary depending on your medical conditions, your age, and other factors. Follow these instructions at home: Eating and drinking   Eat a diet that is high in fiber and potassium, and low in sodium, added sugar, and fat. An example eating plan is called the DASH (Dietary Approaches to Stop Hypertension) diet. To eat this way: ? Eat plenty of fresh fruits and vegetables. Try to fill one half of your plate at each meal with fruits and vegetables. ? Eat whole grains, such as whole-wheat pasta, brown rice, or whole-grain bread. Fill about one fourth of your plate with whole grains. ? Eat or drink low-fat dairy products, such as skim milk or low-fat yogurt. ? Avoid fatty cuts of meat, processed or cured meats, and poultry with skin. Fill about one fourth of your plate with lean proteins, such as fish, chicken  without skin, beans, eggs, or tofu. ? Avoid pre-made and processed foods. These tend to be higher in sodium, added sugar, and fat.  Reduce your daily sodium intake. Most people with hypertension should eat less than 1,500 mg of sodium a day.  Do not drink alcohol if: ? Your health care provider tells you not to drink. ? You are pregnant, may be pregnant, or are planning to become pregnant.  If you drink alcohol: ? Limit how much you use to:  0-1 drink a day for women.  0-2 drinks a day for men. ? Be aware of how much alcohol is in your drink. In the [place] one drink equals one 12 oz bottle of beer (355 mL), one 5 oz glass of wine (148 mL), or one 1 oz glass of hard liquor (44 mL). Lifestyle   Work with your health care provider to maintain a healthy body weight or to lose weight. Ask what an ideal weight is for you.  Get at least 30 minutes of exercise most days of the week. Activities may include walking, swimming, or biking.  Include exercise to strengthen your muscles (resistance exercise), such as Pilates or lifting weights, as part of your weekly exercise routine. Try to do these types of exercises for 30 minutes at least 3 days a week.  Do not use any products that contain nicotine or tobacco, such as cigarettes, e-cigarettes, and chewing tobacco. If you need help quitting, ask your health care provider.  Monitor your blood pressure at home as told by your health care provider.  Keep all follow-up visits as told by your health care provider. This is important. Medicines  Take over-the-counter and prescription medicines only as told by your health care provider. Follow directions carefully. Blood pressure medicines must be taken as prescribed.  Do not skip doses of blood pressure medicine. Doing this puts you at risk for problems and can make the medicine less effective.  Ask your health care provider about side effects or reactions to medicines that you should watch  for. Contact a health care provider if you:  Think you are having a reaction to a medicine you are taking.  Have headaches that keep coming back (recurring).  Feel dizzy.  Have swelling in your ankles.  Have trouble with your vision. Get help right away if you:  Develop a severe headache or confusion.  Have unusual weakness or numbness.  Feel faint.  Have severe pain in your chest or abdomen.  Vomit repeatedly.  Have trouble breathing. Summary  Hypertension is when the force of blood pumping through your arteries is too strong. If this condition is not controlled, it may put you at risk for serious complications.  Your personal target blood pressure may vary depending on your medical conditions, your age, and other factors. For most people, a normal blood pressure is less than 120/80.  Hypertension is treated with lifestyle changes, medicines, or a combination of both. Lifestyle changes include losing weight, eating a healthy, low-sodium diet, exercising more, and limiting alcohol. This information is not intended to replace advice given to you by your health care provider. Make sure you discuss any questions you have with your health care provider. Document Revised: 12/30/2017 Document Reviewed: 12/30/2017 Elsevier Patient Education  [employer].     If you have lab work done today you will be contacted with your lab results within the next 2 weeks.  If you have not heard from [place] then please contact us. The fastest way to get your results is to register for My Chart.   IF you received an x-ray today, you will receive an invoice from [facility] Radiology. Please contact [facility] Radiology at [phone] with questions or concerns regarding your invoice.   IF you received labwork today, you will receive an invoice from [facility]. Please contact [facility] at [phone] with questions or concerns regarding your invoice.   Our billing staff will not be able to  assist you with questions regarding bills from these companies.  You will be contacted with the lab results as soon as they are available. The fastest way to get your results is to activate your My Chart account. Instructions are located on the last page of this paperwork. If you have not heard from [place] regarding the results in 2 weeks, please contact this office.

## 2019-05-31 LAB — COMPREHENSIVE METABOLIC PANEL
ALT: 27 IU/L (ref 0–44)
AST: 22 IU/L (ref 0–40)
Albumin/Globulin Ratio: 1.6 (ref 1.2–2.2)
Albumin: 4.1 g/dL (ref 3.7–4.7)
Alkaline Phosphatase: 106 IU/L (ref 39–117)
BUN/Creatinine Ratio: 14 (ref 10–24)
BUN: 11 mg/dL (ref 8–27)
Bilirubin Total: 0.3 mg/dL (ref 0.0–1.2)
CO2: 23 mmol/L (ref 20–29)
Calcium: 9.3 mg/dL (ref 8.6–10.2)
Chloride: 108 mmol/L — ABNORMAL HIGH (ref 96–106)
Creatinine, Ser: 0.78 mg/dL (ref 0.76–1.27)
GFR calc Af Amer: 104 mL/min/{1.73_m2} (ref >59)
GFR calc non Af Amer: 90 mL/min/{1.73_m2} (ref >59)
Globulin, Total: 2.5 g/dL (ref 1.5–4.5)
Glucose: 142 mg/dL — ABNORMAL HIGH (ref 65–99)
Potassium: 4.2 mmol/L (ref 3.5–5.2)
Sodium: 145 mmol/L — ABNORMAL HIGH (ref 134–144)
Total Protein: 6.6 g/dL (ref 6.0–8.5)

## 2019-05-31 LAB — LIPID PANEL
Chol/HDL Ratio: 2.5 ratio (ref 0.0–5.0)
Cholesterol, Total: 127 mg/dL (ref 100–199)
HDL: 51 mg/dL (ref >39)
LDL Chol Calc (NIH): 61 mg/dL (ref 0–99)
Triglycerides: 75 mg/dL (ref 0–149)
VLDL Cholesterol Cal: 15 mg/dL (ref 5–40)

## 2019-05-31 LAB — CBC WITH DIFFERENTIAL/PLATELET
Basophils Absolute: 0 10*3/uL (ref 0.0–0.2)
Basos: 0 %
EOS (ABSOLUTE): 0.1 10*3/uL (ref 0.0–0.4)
Eos: 2 %
Hematocrit: 38.5 % (ref 37.5–51.0)
Hemoglobin: 12.8 g/dL — ABNORMAL LOW (ref 13.0–17.7)
Immature Grans (Abs): 0 10*3/uL (ref 0.0–0.1)
Immature Granulocytes: 0 %
Lymphocytes Absolute: 1.7 10*3/uL (ref 0.7–3.1)
Lymphs: 29 %
MCH: 30.3 pg (ref 26.6–33.0)
MCHC: 33.2 g/dL (ref 31.5–35.7)
MCV: 91 fL (ref 79–97)
Monocytes Absolute: 0.4 10*3/uL (ref 0.1–0.9)
Monocytes: 7 %
Neutrophils Absolute: 3.5 10*3/uL (ref 1.4–7.0)
Neutrophils: 62 %
Platelets: 237 10*3/uL (ref 150–450)
RBC: 4.23 x10E6/uL (ref 4.14–5.80)
RDW: 11.8 % (ref 11.6–15.4)
WBC: 5.7 10*3/uL (ref 3.4–10.8)

## 2019-06-01 LAB — HM DIABETES EYE EXAM

## 2019-06-08 ENCOUNTER — Encounter: Payer: Self-pay | Admitting: *Deleted

## 2019-06-12 ENCOUNTER — Other Ambulatory Visit: Payer: Self-pay | Admitting: Emergency Medicine

## 2019-06-12 DIAGNOSIS — E785 Hyperlipidemia, unspecified: Secondary | ICD-10-CM

## 2019-06-12 NOTE — Telephone Encounter (Signed)
Requested Prescriptions  Pending Prescriptions Disp Refills  . atorvastatin (LIPITOR) 20 MG tablet [Pharmacy Med Name: Atorvastatin Calcium 20 MG Oral Tablet] 90 tablet 0    Sig: Take 1 tablet by mouth once daily     Cardiovascular:  Antilipid - Statins Failed - 06/12/2019 10:44 AM      Failed - LDL in normal range and within 360 days    LDL Chol Calc (NIH)  Date Value Ref Range Status  05/30/2019 61 0 - 99 mg/dL Final         Passed - Total Cholesterol in normal range and within 360 days    Cholesterol, Total  Date Value Ref Range Status  05/30/2019 127 100 - 199 mg/dL Final         Passed - HDL in normal range and within 360 days    HDL  Date Value Ref Range Status  05/30/2019 51 >39 mg/dL Final         Passed - Triglycerides in normal range and within 360 days    Triglycerides  Date Value Ref Range Status  05/30/2019 75 0 - 149 mg/dL Final         Passed - Patient is not pregnant      Passed - Valid encounter within last 12 months    Recent Outpatient Visits          1 week ago Hypertension associated with type 2 diabetes mellitus ([place])   [facility] at [place], [place], MD   1 month ago Hypertension associated with type 2 diabetes mellitus [facility])   Primary Care at [place], [place], MD   1 month ago Hypertension associated with type 2 diabetes mellitus [facility])   Primary Care at [place], [place], MD   6 months ago Hypertension associated with type 2 diabetes mellitus [facility])   Primary Care at [facility], Ines Bloomer, MD   1 year ago Medicare annual wellness visit, subsequent   Primary Care at [facility], Ines Bloomer, MD      Future Appointments            In 2 months [place], Ines Bloomer, MD Primary Care at [place], [facility]

## 2019-06-21 ENCOUNTER — Telehealth: Payer: Self-pay | Admitting: *Deleted

## 2019-06-21 NOTE — Telephone Encounter (Signed)
CALLED PATIENT TO INFORM OF SCAN FOR 06-30-19 - ARRIVAL TIME- 2:15 PM @ [facility] RADIOLOGY, NO RESTRICTIONS TO TEST, PATIENT TO RECEIVE RESULTS FROM DR. KINARD ON 07-04-19, SPOKE WITH [name]- [place] AND SHE VERIFIED UNDERSTANDING THESE APPTS., SHE ALSO STATED THAT HER BROTHER DOESN'T HAVE COVID AND AS FAR AS SHE KNOWS HE HASN'T BEEN WITH ANYONE THAT HAS COVID

## 2019-06-30 ENCOUNTER — Ambulatory Visit (HOSPITAL_COMMUNITY)
Admission: RE | Admit: 2019-06-30 | Discharge: 2019-06-30 | Disposition: A | Discharge status: Home / Self Care | Payer: Medicare Other | Source: Ambulatory Visit | Attending: Radiation Oncology | Admitting: Radiation Oncology

## 2019-06-30 DIAGNOSIS — C3411 Malignant neoplasm of upper lobe, right bronchus or lung: Secondary | ICD-10-CM | POA: Diagnosis present

## 2019-06-30 IMAGING — CT CT CHEST W/O CM
2 of 3 series · 15 of 36 positions shown, 18 images · non-contrast
Comparison: [DATE].  PET-CT [DATE]. [DATE].

CLINICAL DATA: Non-small cell right upper lobe lung cancer.

EXAM:
CT CHEST WITHOUT CONTRAST
TECHNIQUE: Multidetector CT imaging of the chest was performed following the
standard protocol without IV contrast.

[Series 2: thorax · axial · 0.68mm/px · z∈[-328,-66]mm · 12 of 155 slices shown, 15 images]
[im 12/155  mediastinal]
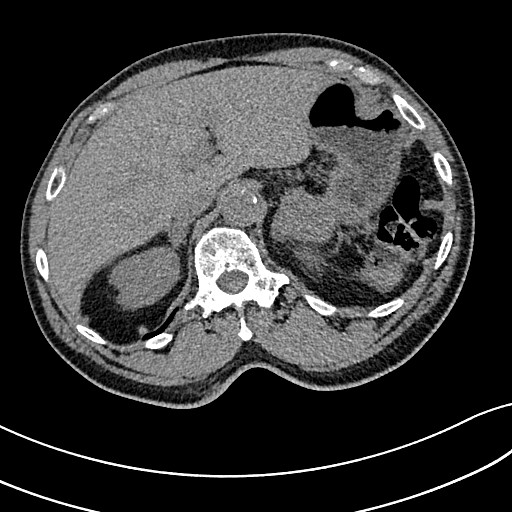
[im 12/155  lung]
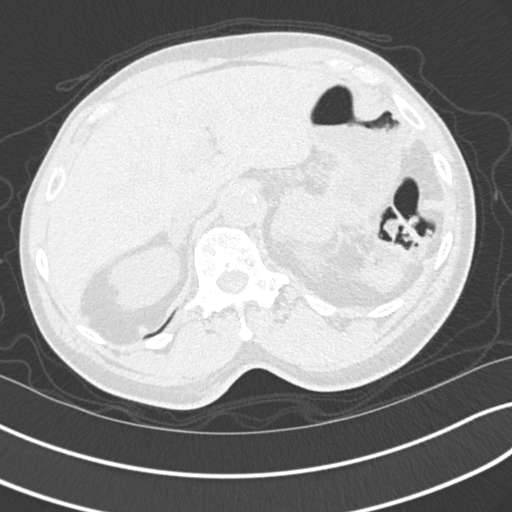
[im 23/155  lung]
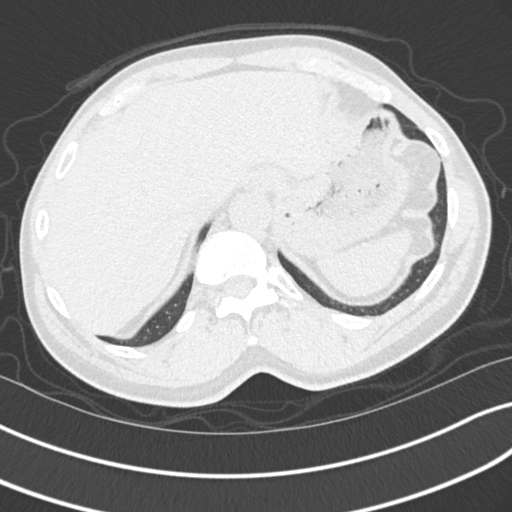
[im 35/155  lung]
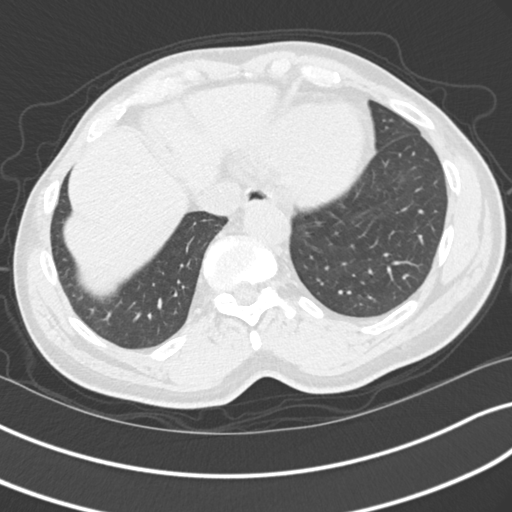
[im 46/155  lung]
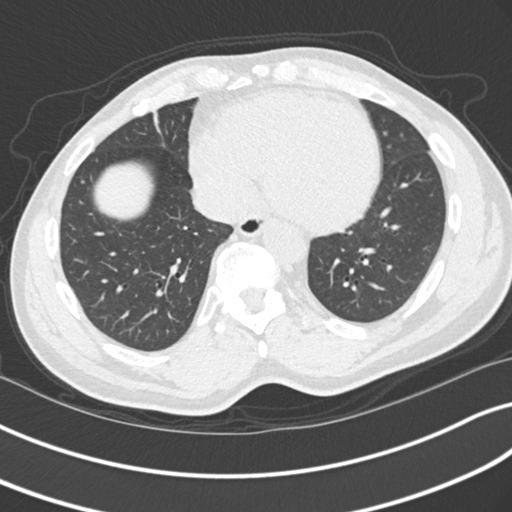
[im 58/155  mediastinal]
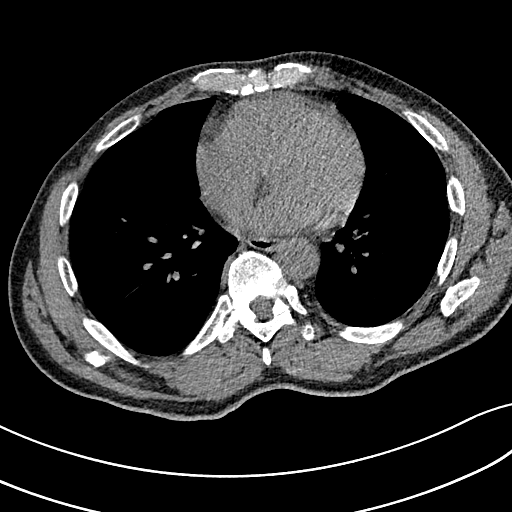
[im 58/155  lung]
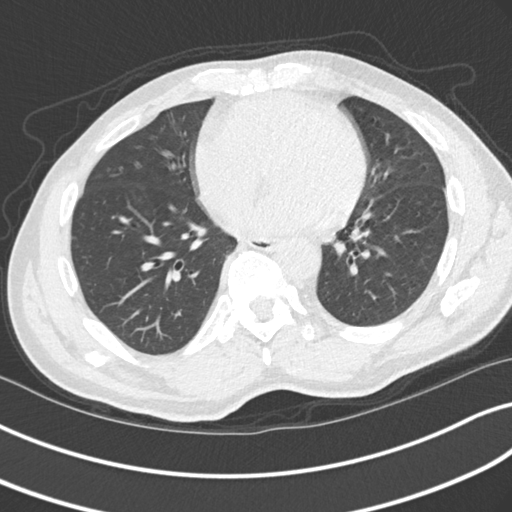
[im 69/155  lung]
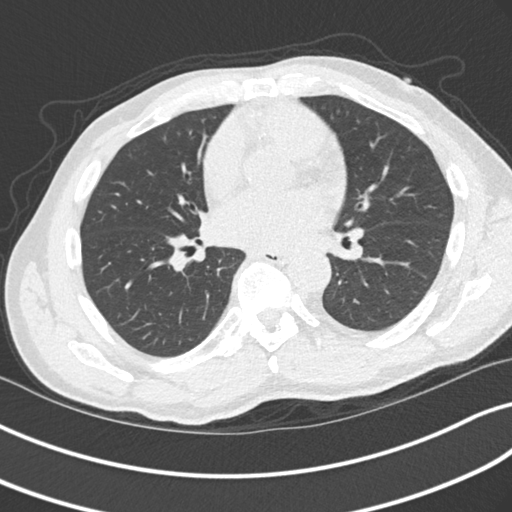
[im 86/155  lung]
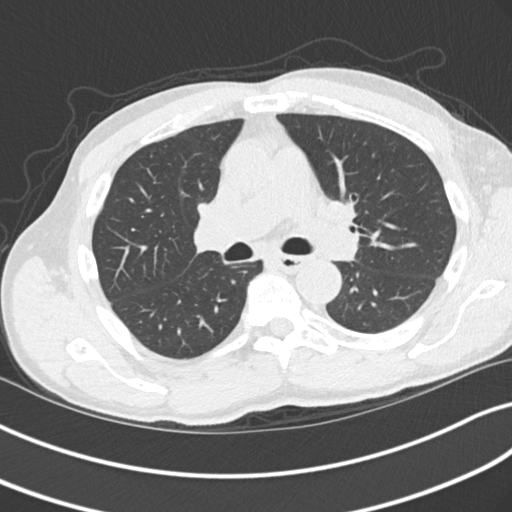
[im 97/155  lung]
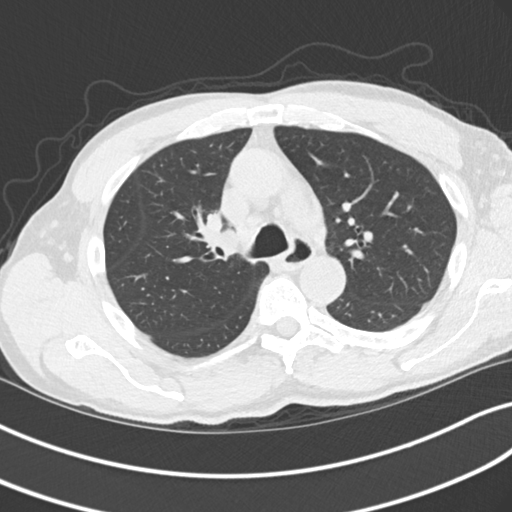
[im 109/155  mediastinal]
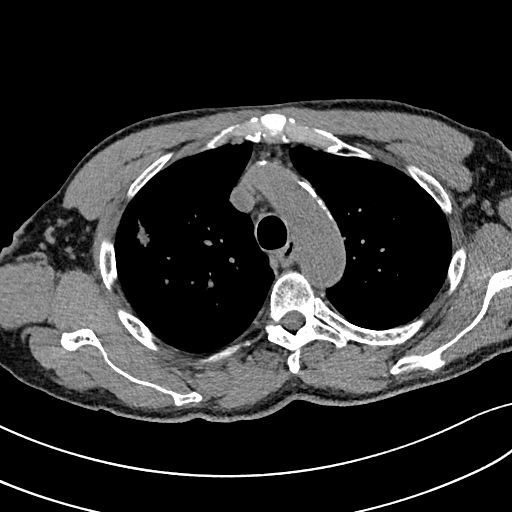
[im 109/155  lung]
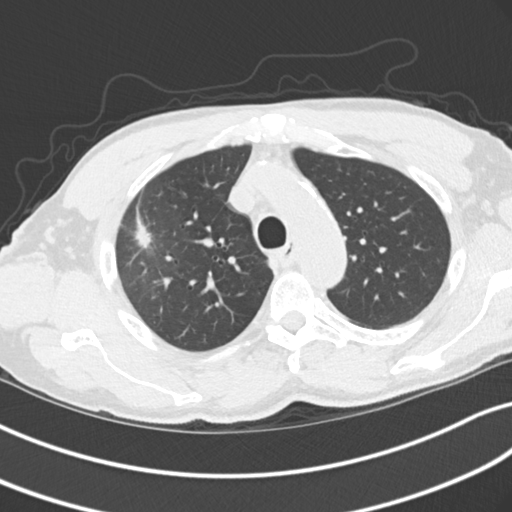
[im 120/155  lung]
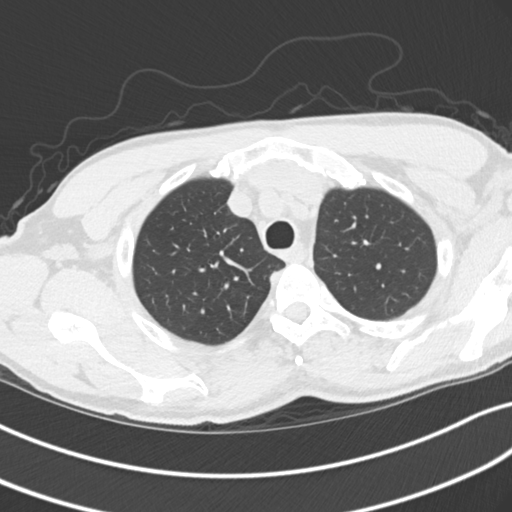
[im 132/155  lung]
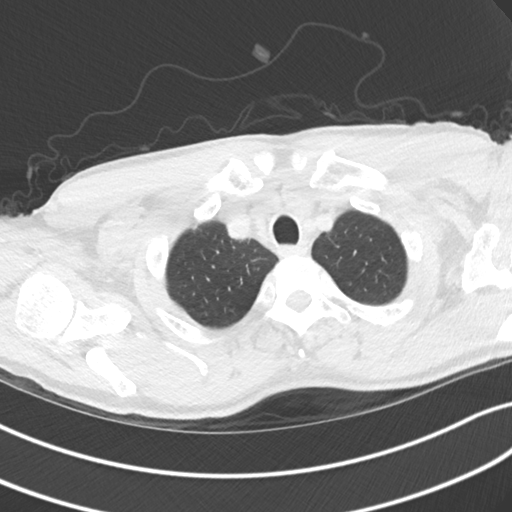
[im 143/155  lung]
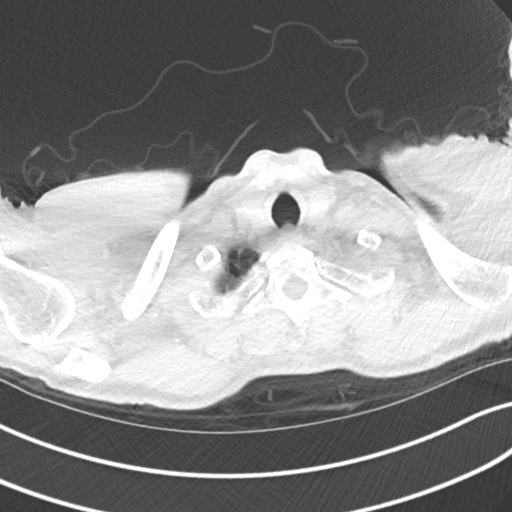

[Series 6: coronal · coronal · 0.63mm/px · 3 of 120 slices shown]
[im 24/120  lung]
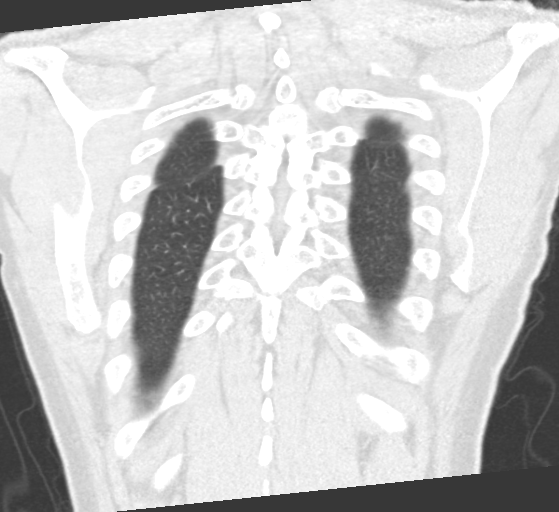
[im 48/120  lung]
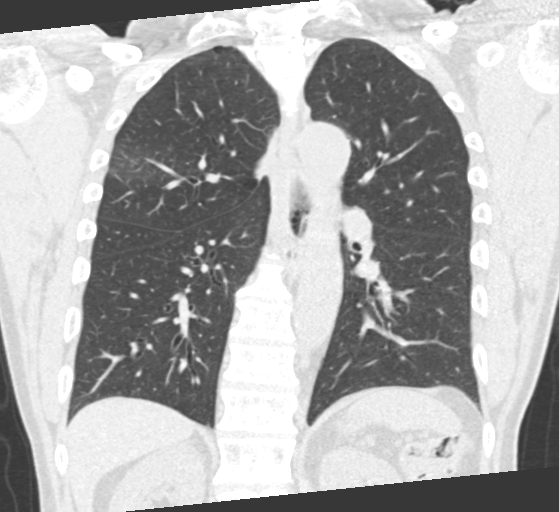
[im 72/120  lung]
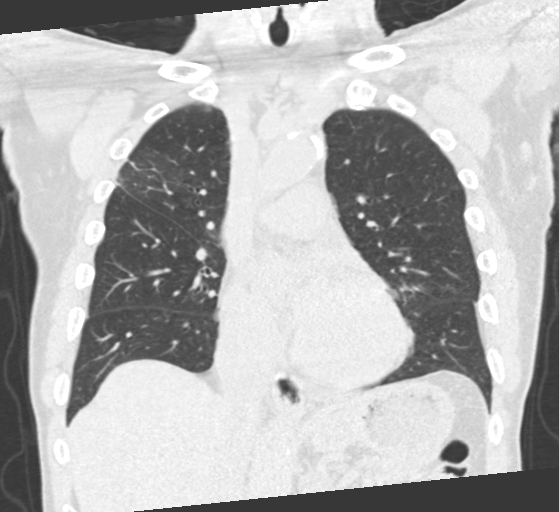

[15 of 36 positions shown; findings below may reference images not displayed]

FINDINGS: Cardiovascular: The heart size is normal. No substantial pericardial
effusion. Atherosclerotic calcification is noted in the wall of the
thoracic aorta.

Mediastinum/Nodes: Scattered small mediastinal lymph nodes are
similar to [DATE] No evidence for gross hilar lymphadenopathy
although assessment is limited by the lack of intravenous contrast
on today's study. The esophagus has normal imaging features. There
is no axillary lymphadenopathy.

Lungs/Pleura: Centrilobular emphsyema noted. Right upper lobe
pulmonary nodule has decreased, measuring 1.3 x 0.9 cm today
compared to 1.8 x 1.2 cm on [DATE]. There is some minimal
surrounding streaky soft tissue density, likely sequelae of
radiation therapy. Right upper lobe fiducial markers are evident.

No new suspicious pulmonary nodule or mass. No focal airspace
consolidation. No pleural effusion.

Upper Abdomen: 10 mm right adrenal nodule is stable back to CT
screening study of [DATE], likely adenoma. This was not
hypermetabolic on previous PET-CT.

Musculoskeletal: No worrisome lytic or sclerotic osseous
abnormality.
IMPRESSION: 1. Interval decrease in size of the right upper lobe pulmonary
nodule with some minimal probable evolving radiation scarring.
2. No new or progressive findings to raise concern for metastatic
disease.
3.  Emphysema ([JE]-[JE]) and Aortic Atherosclerosis ([JE]-170.0)

## 2019-07-01 ENCOUNTER — Ambulatory Visit (HOSPITAL_COMMUNITY): Payer: Medicare Other

## 2019-07-04 ENCOUNTER — Ambulatory Visit
Admission: RE | Admit: 2019-07-04 | Discharge: 2019-07-04 | Disposition: A | Discharge status: Home / Self Care | Payer: Medicare Other | Source: Ambulatory Visit | Attending: Radiation Oncology | Admitting: Radiation Oncology

## 2019-07-04 ENCOUNTER — Other Ambulatory Visit: Payer: Self-pay

## 2019-07-04 ENCOUNTER — Encounter: Payer: Self-pay | Admitting: Radiation Oncology

## 2019-07-04 VITALS — BP 157/67 | HR 62 | Temp 98.3°F | Resp 18 | Ht 66.5 in | Wt 164.5 lb

## 2019-07-04 DIAGNOSIS — C3411 Malignant neoplasm of upper lobe, right bronchus or lung: Secondary | ICD-10-CM | POA: Insufficient documentation

## 2019-07-04 DIAGNOSIS — Z7984 Long term (current) use of oral hypoglycemic drugs: Secondary | ICD-10-CM | POA: Diagnosis not present

## 2019-07-04 DIAGNOSIS — Z79899 Other long term (current) drug therapy: Secondary | ICD-10-CM | POA: Diagnosis not present

## 2019-07-04 DIAGNOSIS — I10 Essential (primary) hypertension: Secondary | ICD-10-CM | POA: Insufficient documentation

## 2019-07-04 DIAGNOSIS — Z923 Personal history of irradiation: Secondary | ICD-10-CM | POA: Diagnosis not present

## 2019-07-04 DIAGNOSIS — Z87891 Personal history of nicotine dependence: Secondary | ICD-10-CM | POA: Diagnosis not present

## 2019-07-04 DIAGNOSIS — J439 Emphysema, unspecified: Secondary | ICD-10-CM | POA: Insufficient documentation

## 2019-07-04 NOTE — Progress Notes (Signed)
Pt presents today for f/u with Dr. Sondra Come to review recent CT scan. Pt denies c/o pain. Pt reports cough in the mornings with white phlegm. Pt denies hemoptysis. Pt denies SOB. Pt denies difficulty swallowing.  BP (!) 157/67 (BP Location: Left Arm, Patient Position: Sitting)   Pulse 62   Temp 98.3 F (36.8 C) (Temporal)   Resp 18   Ht 5' 6.5" (1.689 m)   Wt 164 lb 8 oz (74.6 kg)   SpO2 100%   BMI 26.15 kg/m   Wt Readings from Last 3 Encounters:  07/04/19 164 lb 8 oz (74.6 kg)  05/30/19 164 lb 12.8 oz (74.8 kg)  04/18/19 161 lb 3.2 oz (73.1 kg)   Loma Sousa, RN BSN

## 2019-07-04 NOTE — Progress Notes (Signed)
Radiation Oncology         [phone] ________________________________  Name: Benjamin Willis. MRN: [id number]  Date: 07/04/2019  DOB: 1945/10/18  Follow-Up Visit Note  CC: Benjamin Pollen, MD  Benjamin Nash, DO    ICD-10-CM   1. Primary cancer of right upper lobe of lung (HCC)  C34.11     Diagnosis: Stage IA2(cT1b, N0)non-small cell lung cancer (adenocarcinoma)  Interval Since Last Radiation: Four months and three days.  adiation Treatment Dates: 02/21/2019 through 02/28/2019 Site Technique Total Dose (Gy) Dose per Fx (Gy) Completed Fx Beam Energies  Thorax: Lung_Rt SBRT 54/54 18 3/3 6XFFF    Narrative:  The patient returns today for routine follow-up. Since last follow-up, the patient was seen in the ED on 04/16/2019 for hypertension. Lisinopril was increased to 40 mg daily and the patient was also to continue Amlodipine 5 mg daily. Amlodipine was then increased to 10 mg daily by Dr. Mitchel Willis on 05/30/2019 for continued systolic hypertension.   CT of chest on 06/30/2019 showed an interval decrease in the size of the right upper lobe pulmonary nodule with some minimal probable evolving radiation scarring. No new or progressive findings to raise concern for metastatic disease.  On review of systems, he reports productive cough with white phlegm. He denies chest pain, hemoptysis, shortness of breath, and difficulty swallowing.  ALLERGIES:  has No Known Allergies.  Meds: Current Outpatient Medications  Medication Sig Dispense Refill  . albuterol (VENTOLIN HFA) 108 (90 Base) MCG/ACT inhaler Inhale 2 puffs into the lungs every 6 (six) hours as needed for wheezing or shortness of breath. 18 g 6  . amLODipine (NORVASC) 5 MG tablet Take 1 tablet (5 mg total) by mouth daily. 90 tablet 3  . atorvastatin (LIPITOR) 20 MG tablet Take 1 tablet by mouth once daily 90 tablet 0  . blood glucose meter kit and supplies Per insurance preference. Check blood glucose once a day. Dx  E11.9 1 each 3  . Blood Pressure Monitoring (BLOOD PRESSURE MONITOR/ARM) DEVI Check BP once a day. DX I 10 1 each 0  . cetirizine (ZYRTEC) 10 MG tablet Take 1 tablet (10 mg total) by mouth daily. 30 tablet 11  . glipiZIDE (GLUCOTROL) 5 MG tablet Take 1 tablet (5 mg total) by mouth daily before breakfast. 90 tablet 1  . lisinopril (ZESTRIL) 40 MG tablet Take 1 tablet (40 mg total) by mouth daily. 90 tablet 3  . metFORMIN (GLUCOPHAGE) 500 MG tablet Take 1 tablet (500 mg total) by mouth 2 (two) times daily with a meal. Take 1 tablet by mouth once daily with breakfast 180 tablet 1  . Multiple Vitamins-Minerals (MULTIVITAMIN MEN) TABS Take 1 tablet by mouth daily.     No current facility-administered medications for this encounter.    Physical Findings: The patient is in no acute distress. Patient is alert and oriented.  height is 5' 6.5" (1.689 m) and weight is 164 lb 8 oz (74.6 kg). His temporal temperature is 98.3 F (36.8 C). His blood pressure is 157/67 (abnormal) and his pulse is 62. His respiration is 18 and oxygen saturation is 100%. .   Lungs are clear to auscultation bilaterally. Heart has regular rate and rhythm. No palpable cervical, supraclavicular, or axillary adenopathy. Abdomen soft, non-tender, normal bowel sounds.   Lab Findings: Lab Results  Component Value Date   WBC 5.7 05/30/2019   HGB 12.8 (L) 05/30/2019   HCT 38.5 05/30/2019   MCV 91 05/30/2019   PLT 237  05/30/2019    Radiographic Findings: CT Chest Wo Contrast  Result Date: 06/30/2019 CLINICAL DATA:  Non-small cell right upper lobe lung cancer. EXAM: CT CHEST WITHOUT CONTRAST TECHNIQUE: Multidetector CT imaging of the chest was performed following the standard protocol without IV contrast. COMPARISON:  06/22/2018.  PET-CT 07/20/2018. 01/24/2019. FINDINGS: Cardiovascular: The heart size is normal. No substantial pericardial effusion. Atherosclerotic calcification is noted in the wall of the thoracic aorta.  Mediastinum/Nodes: Scattered small mediastinal lymph nodes are similar to 01/24/2019 No evidence for gross hilar lymphadenopathy although assessment is limited by the lack of intravenous contrast on today's study. The esophagus has normal imaging features. There is no axillary lymphadenopathy. Lungs/Pleura: Centrilobular emphsyema noted. Right upper lobe pulmonary nodule has decreased, measuring 1.3 x 0.9 cm today compared to 1.8 x 1.2 cm on 01/24/2019. There is some minimal surrounding streaky soft tissue density, likely sequelae of radiation therapy. Right upper lobe fiducial markers are evident. No new suspicious pulmonary nodule or mass. No focal airspace consolidation. No pleural effusion. Upper Abdomen: 10 mm right adrenal nodule is stable back to CT screening study of 06/17/2017, likely adenoma. This was not hypermetabolic on previous PET-CT. Musculoskeletal: No worrisome lytic or sclerotic osseous abnormality. IMPRESSION: 1. Interval decrease in size of the right upper lobe pulmonary nodule with some minimal probable evolving radiation scarring. 2. No new or progressive findings to raise concern for metastatic disease. 3.  Emphysema (ICD10-J43.9) and Aortic Atherosclerosis (ICD10-170.0) Electronically Signed   By: Misty Stanley M.D.   On: 06/30/2019 16:48    Impression: Stage IA2(cT1b, N0)non-small cell lung cancer (adenocarcinoma)  Recent CT results reviewed.  Favorable results to stereotactic body radiation therapy.  Discussed findings with patient and he is pleased  Plan: Routine follow up with radiation oncology in six months.  Prior to this visit the patient will have another chest CT scan  ____________________________________  Blair Promise, PhD, MD  This document serves as a record of services personally performed by Gery Pray, MD. It was created on his behalf by Clerance Lav, a trained medical scribe. The creation of this record is based on the scribe's personal observations and  the provider's statements to them. This document has been checked and approved by the attending provider.

## 2019-07-04 NOTE — Patient Instructions (Signed)
Coronavirus (COVID-19) Are you at risk?  Are you at risk for the Coronavirus (COVID-19)?  To be considered HIGH RISK for Coronavirus (COVID-19), you have to meet the following criteria:  . Traveled to [place], [place], [place], [place] or [place]; or in the [place] to [place], [place], [place], or [place]; and have fever, cough, and shortness of breath within the last 2 weeks of travel OR . Been in close contact with a person diagnosed with COVID-19 within the last 2 weeks and have fever, cough, and shortness of breath . IF YOU DO NOT MEET THESE CRITERIA, YOU ARE CONSIDERED LOW RISK FOR COVID-19.  What to do if you are HIGH RISK for COVID-19?  . If you are having a medical emergency, call 911. . Seek medical care right away. Before you go to a doctor's office, urgent care or emergency department, call ahead and tell them about your recent travel, contact with someone diagnosed with COVID-19, and your symptoms. You should receive instructions from your physician's office regarding next steps of care.  . When you arrive at healthcare provider, tell the healthcare staff immediately you have returned from visiting [place], [place], [place], [place] or [place]; or traveled in the [place] to [place], [place], [place], or [place]; in the last two weeks or you have been in close contact with a person diagnosed with COVID-19 in the last 2 weeks.   . Tell the health care staff about your symptoms: fever, cough and shortness of breath. . After you have been seen by a medical provider, you will be either: o Tested for (COVID-19) and discharged home on quarantine except to seek medical care if symptoms worsen, and asked to  - Stay home and avoid contact with others until you get your results (4-5 days)  - Avoid travel on public transportation if possible (such as bus, train, or airplane) or o Sent to the Emergency Department by EMS for evaluation, COVID-19 testing, and possible  admission depending on your condition and test results.  What to do if you are LOW RISK for COVID-19?  Reduce your risk of any infection by using the same precautions used for avoiding the common cold or flu:  . Wash your hands often with soap and warm water for at least 20 seconds.  If soap and water are not readily available, use an alcohol-based hand sanitizer with at least 60% alcohol.  . If coughing or sneezing, cover your mouth and nose by coughing or sneezing into the elbow areas of your shirt or coat, into a tissue or into your sleeve (not your hands). . Avoid shaking hands with others and consider head nods or verbal greetings only. . Avoid touching your eyes, nose, or mouth with unwashed hands.  . Avoid close contact with people who are sick. . Avoid places or events with large numbers of people in one location, like concerts or sporting events. . Carefully consider travel plans you have or are making. . If you are planning any travel outside or inside the [place], visit the CDC's Travelers' Health webpage for the latest health notices. . If you have some symptoms but not all symptoms, continue to monitor at home and seek medical attention if your symptoms worsen. . If you are having a medical emergency, call 911.   ADDITIONAL HEALTHCARE OPTIONS FOR PATIENTS  [facility] / e-Visit: [email or web address]         [facility]: [phone]  [facility]: [phone]                   [facility]: [phone]   

## 2019-07-06 ENCOUNTER — Encounter: Payer: Self-pay | Admitting: *Deleted

## 2019-07-11 ENCOUNTER — Ambulatory Visit: Payer: Medicare Other | Admitting: Emergency Medicine

## 2019-08-29 ENCOUNTER — Ambulatory Visit: Payer: Medicare Other | Admitting: Emergency Medicine

## 2019-09-06 ENCOUNTER — Telehealth: Payer: Self-pay | Admitting: *Deleted

## 2019-09-06 ENCOUNTER — Other Ambulatory Visit: Payer: Self-pay

## 2019-09-06 ENCOUNTER — Encounter: Payer: Self-pay | Admitting: Emergency Medicine

## 2019-09-06 ENCOUNTER — Ambulatory Visit (INDEPENDENT_AMBULATORY_CARE_PROVIDER_SITE_OTHER): Payer: Medicare Other | Admitting: Emergency Medicine

## 2019-09-06 VITALS — BP 136/66 | HR 59 | Temp 98.3°F | Resp 16 | Ht 66.0 in | Wt 159.0 lb

## 2019-09-06 DIAGNOSIS — I10 Essential (primary) hypertension: Secondary | ICD-10-CM | POA: Diagnosis not present

## 2019-09-06 DIAGNOSIS — E1159 Type 2 diabetes mellitus with other circulatory complications: Secondary | ICD-10-CM | POA: Diagnosis not present

## 2019-09-06 DIAGNOSIS — E785 Hyperlipidemia, unspecified: Secondary | ICD-10-CM

## 2019-09-06 DIAGNOSIS — Z1211 Encounter for screening for malignant neoplasm of colon: Secondary | ICD-10-CM

## 2019-09-06 DIAGNOSIS — E1169 Type 2 diabetes mellitus with other specified complication: Secondary | ICD-10-CM

## 2019-09-06 LAB — COMPREHENSIVE METABOLIC PANEL
ALT: 20 IU/L (ref 0–44)
AST: 15 IU/L (ref 0–40)
Albumin/Globulin Ratio: 1.5 (ref 1.2–2.2)
Albumin: 3.8 g/dL (ref 3.7–4.7)
Alkaline Phosphatase: 89 IU/L (ref 39–117)
BUN/Creatinine Ratio: 15 (ref 10–24)
BUN: 11 mg/dL (ref 8–27)
Bilirubin Total: 0.4 mg/dL (ref 0.0–1.2)
CO2: 24 mmol/L (ref 20–29)
Calcium: 9.2 mg/dL (ref 8.6–10.2)
Chloride: 105 mmol/L (ref 96–106)
Creatinine, Ser: 0.72 mg/dL — ABNORMAL LOW (ref 0.76–1.27)
GFR calc Af Amer: 108 mL/min/{1.73_m2} (ref >59)
GFR calc non Af Amer: 93 mL/min/{1.73_m2} (ref >59)
Globulin, Total: 2.6 g/dL (ref 1.5–4.5)
Glucose: 123 mg/dL — ABNORMAL HIGH (ref 65–99)
Potassium: 4.2 mmol/L (ref 3.5–5.2)
Sodium: 143 mmol/L (ref 134–144)
Total Protein: 6.4 g/dL (ref 6.0–8.5)

## 2019-09-06 LAB — LIPID PANEL
Chol/HDL Ratio: 3.1 ratio (ref 0.0–5.0)
Cholesterol, Total: 110 mg/dL (ref 100–199)
HDL: 35 mg/dL — ABNORMAL LOW (ref >39)
LDL Chol Calc (NIH): 58 mg/dL (ref 0–99)
Triglycerides: 85 mg/dL (ref 0–149)
VLDL Cholesterol Cal: 17 mg/dL (ref 5–40)

## 2019-09-06 LAB — POCT GLYCOSYLATED HEMOGLOBIN (HGB A1C): Hemoglobin A1C: 7.3 % — AB (ref 4.0–5.6)

## 2019-09-06 LAB — GLUCOSE, POCT (MANUAL RESULT ENTRY): POC Glucose: 142 mg/dl — AB (ref 70–99)

## 2019-09-06 MED ORDER — LISINOPRIL 40 MG PO TABS: 40.0000 mg | ORAL_TABLET | Freq: Every day | ORAL | 3 refills | Status: DC

## 2019-09-06 MED ORDER — GLIPIZIDE 5 MG PO TABS: 5.0000 mg | ORAL_TABLET | Freq: Two times a day (BID) | ORAL | 3 refills | Status: DC

## 2019-09-06 MED ORDER — ATORVASTATIN CALCIUM 20 MG PO TABS: 20.0000 mg | ORAL_TABLET | Freq: Every day | ORAL | 3 refills | Status: DC

## 2019-09-06 NOTE — Assessment & Plan Note (Signed)
Well-controlled hypertension.  Continue present medication.  No changes. Hemoglobin A1c better than before at 7.3.  Continue metformin 500 mg twice a day and increase glipizide to 5 mg twice a day. Follow-up in 3 months. Continue statin therapy.

## 2019-09-06 NOTE — Patient Instructions (Addendum)
If you have lab work done today you will be contacted with your lab results within the next 2 weeks.  If you have not heard from [place] then please contact us. The fastest way to get your results is to register for My Chart.   IF you received an x-ray today, you will receive an invoice from [facility]. Please contact [facility] at [phone] with questions or concerns regarding your invoice.   IF you received labwork today, you will receive an invoice from [place]. Please contact [facility] at [phone] with questions or concerns regarding your invoice.   Our billing staff will not be able to assist you with questions regarding bills from these companies.  You will be contacted with the lab results as soon as they are available. The fastest way to get your results is to activate your My Chart account. Instructions are located on the last page of this paperwork. If you have not heard from [place] regarding the results in 2 weeks, please contact this office.     Diabetes Mellitus and Nutrition, Adult When you have diabetes (diabetes mellitus), it is very important to have healthy eating habits because your blood sugar (glucose) levels are greatly affected by what you eat and drink. Eating healthy foods in the appropriate amounts, at about the same times every day, can help you:  Control your blood glucose.  Lower your risk of heart disease.  Improve your blood pressure.  Reach or maintain a healthy weight. Every person with diabetes is different, and each person has different needs for a meal plan. Your health care provider may recommend that you work with a diet and nutrition specialist (dietitian) to make a meal plan that is best for you. Your meal plan may vary depending on factors such as:  The calories you need.  The medicines you take.  Your weight.  Your blood glucose, blood pressure, and cholesterol levels.  Your activity level.  Other health conditions  you have, such as heart or kidney disease. How do carbohydrates affect me? Carbohydrates, also called carbs, affect your blood glucose level more than any other type of food. Eating carbs naturally raises the amount of glucose in your blood. Carb counting is a method for keeping track of how many carbs you eat. Counting carbs is important to keep your blood glucose at a healthy level, especially if you use insulin or take certain oral diabetes medicines. It is important to know how many carbs you can safely have in each meal. This is different for every person. Your dietitian can help you calculate how many carbs you should have at each meal and for each snack. Foods that contain carbs include:  Bread, cereal, rice, pasta, and crackers.  Potatoes and corn.  Peas, beans, and lentils.  Milk and yogurt.  Fruit and juice.  Desserts, such as cakes, cookies, ice cream, and candy. How does alcohol affect me? Alcohol can cause a sudden decrease in blood glucose (hypoglycemia), especially if you use insulin or take certain oral diabetes medicines. Hypoglycemia can be a life-threatening condition. Symptoms of hypoglycemia (sleepiness, dizziness, and confusion) are similar to symptoms of having too much alcohol. If your health care provider says that alcohol is safe for you, follow these guidelines:  Limit alcohol intake to no more than 1 drink per day for nonpregnant women and 2 drinks per day for men. One drink equals 12 oz of beer, 5 oz of wine, or 1 oz of hard liquor.  Do not drink on an empty stomach.  Keep yourself hydrated with water, diet soda, or unsweetened iced tea.  Keep in mind that regular soda, juice, and other mixers may contain a lot of sugar and must be counted as carbs. What are tips for following this plan?  Reading food labels  Start by checking the serving size on the "Nutrition Facts" label of packaged foods and drinks. The amount of calories, carbs, fats, and other  nutrients listed on the label is based on one serving of the item. Many items contain more than one serving per package.  Check the total grams (g) of carbs in one serving. You can calculate the number of servings of carbs in one serving by dividing the total carbs by 15. For example, if a food has 30 g of total carbs, it would be equal to 2 servings of carbs.  Check the number of grams (g) of saturated and trans fats in one serving. Choose foods that have low or no amount of these fats.  Check the number of milligrams (mg) of salt (sodium) in one serving. Most people should limit total sodium intake to less than 2,300 mg per day.  Always check the nutrition information of foods labeled as "low-fat" or "nonfat". These foods may be higher in added sugar or refined carbs and should be avoided.  Talk to your dietitian to identify your daily goals for nutrients listed on the label. Shopping  Avoid buying canned, premade, or processed foods. These foods tend to be high in fat, sodium, and added sugar.  Shop around the outside edge of the grocery store. This includes fresh fruits and vegetables, bulk grains, fresh meats, and fresh dairy. Cooking  Use low-heat cooking methods, such as baking, instead of high-heat cooking methods like deep frying.  Cook using healthy oils, such as olive, canola, or sunflower oil.  Avoid cooking with butter, cream, or high-fat meats. Meal planning  Eat meals and snacks regularly, preferably at the same times every day. Avoid going long periods of time without eating.  Eat foods high in fiber, such as fresh fruits, vegetables, beans, and whole grains. Talk to your dietitian about how many servings of carbs you can eat at each meal.  Eat 4-6 ounces (oz) of lean protein each day, such as lean meat, chicken, fish, eggs, or tofu. One oz of lean protein is equal to: ? 1 oz of meat, chicken, or fish. ? 1 egg. ?  cup of tofu.  Eat some foods each day that contain  healthy fats, such as avocado, nuts, seeds, and fish. Lifestyle  Check your blood glucose regularly.  Exercise regularly as told by your health care provider. This may include: ? 150 minutes of moderate-intensity or vigorous-intensity exercise each week. This could be brisk walking, biking, or water aerobics. ? Stretching and doing strength exercises, such as yoga or weightlifting, at least 2 times a week.  Take medicines as told by your health care provider.  Do not use any products that contain nicotine or tobacco, such as cigarettes and e-cigarettes. If you need help quitting, ask your health care provider.  Work with a Social worker or diabetes educator to identify strategies to manage stress and any emotional and social challenges. Questions to ask a health care provider  Do I need to meet with a diabetes educator?  Do I need to meet with a dietitian?  What number can I call if I have questions?  When are the best times to  check my blood glucose? Where to find more information:  American Diabetes Association: diabetes.org  Academy of Nutrition and Dietetics: www.eatright.[employer] of Diabetes and Digestive and Kidney Diseases (NIH): [employer] Summary  A healthy meal plan will help you control your blood glucose and maintain a healthy lifestyle.  Working with a diet and nutrition specialist (dietitian) can help you make a meal plan that is best for you.  Keep in mind that carbohydrates (carbs) and alcohol have immediate effects on your blood glucose levels. It is important to count carbs and to use alcohol carefully. This information is not intended to replace advice given to you by your health care provider. Make sure you discuss any questions you have with your health care provider. Document Revised: 04/03/2017 Document Reviewed: 05/26/2016 Elsevier Patient Education  [place].  Hypertension, Adult High blood pressure (hypertension) is when  the force of blood pumping through the arteries is too strong. The arteries are the blood vessels that carry blood from the heart throughout the body. Hypertension forces the heart to work harder to pump blood and may cause arteries to become narrow or stiff. Untreated or uncontrolled hypertension can cause a heart attack, heart failure, a stroke, kidney disease, and other problems. A blood pressure reading consists of a higher number over a lower number. Ideally, your blood pressure should be below 120/80. The first ("top") number is called the systolic pressure. It is a measure of the pressure in your arteries as your heart beats. The second ("bottom") number is called the diastolic pressure. It is a measure of the pressure in your arteries as the heart relaxes. What are the causes? The exact cause of this condition is not known. There are some conditions that result in or are related to high blood pressure. What increases the risk? Some risk factors for high blood pressure are under your control. The following factors may make you more likely to develop this condition:  Smoking.  Having type 2 diabetes mellitus, high cholesterol, or both.  Not getting enough exercise or physical activity.  Being overweight.  Having too much fat, sugar, calories, or salt (sodium) in your diet.  Drinking too much alcohol. Some risk factors for high blood pressure may be difficult or impossible to change. Some of these factors include:  Having chronic kidney disease.  Having a family history of high blood pressure.  Age. Risk increases with age.  Race. You may be at higher risk if you are African American.  Gender. Men are at higher risk than women before age 59. After age 3, women are at higher risk than men.  Having obstructive sleep apnea.  Stress. What are the signs or symptoms? High blood pressure may not cause symptoms. Very high blood pressure (hypertensive crisis) may  cause:  Headache.  Anxiety.  Shortness of breath.  Nosebleed.  Nausea and vomiting.  Vision changes.  Severe chest pain.  Seizures. How is this diagnosed? This condition is diagnosed by measuring your blood pressure while you are seated, with your arm resting on a flat surface, your legs uncrossed, and your feet flat on the floor. The cuff of the blood pressure monitor will be placed directly against the skin of your upper arm at the level of your heart. It should be measured at least twice using the same arm. Certain conditions can cause a difference in blood pressure between your right and left arms. Certain factors can cause blood pressure readings to be lower or higher  than normal for a short period of time:  When your blood pressure is higher when you are in a health care provider's office than when you are at home, this is called white coat hypertension. Most people with this condition do not need medicines.  When your blood pressure is higher at home than when you are in a health care provider's office, this is called masked hypertension. Most people with this condition may need medicines to control blood pressure. If you have a high blood pressure reading during one visit or you have normal blood pressure with other risk factors, you may be asked to:  Return on a different day to have your blood pressure checked again.  Monitor your blood pressure at home for 1 week or longer. If you are diagnosed with hypertension, you may have other blood or imaging tests to help your health care provider understand your overall risk for other conditions. How is this treated? This condition is treated by making healthy lifestyle changes, such as eating healthy foods, exercising more, and reducing your alcohol intake. Your health care provider may prescribe medicine if lifestyle changes are not enough to get your blood pressure under control, and if:  Your systolic blood pressure is above  130.  Your diastolic blood pressure is above 80. Your personal target blood pressure may vary depending on your medical conditions, your age, and other factors. Follow these instructions at home: Eating and drinking   Eat a diet that is high in fiber and potassium, and low in sodium, added sugar, and fat. An example eating plan is called the DASH (Dietary Approaches to Stop Hypertension) diet. To eat this way: ? Eat plenty of fresh fruits and vegetables. Try to fill one half of your plate at each meal with fruits and vegetables. ? Eat whole grains, such as whole-wheat pasta, brown rice, or whole-grain bread. Fill about one fourth of your plate with whole grains. ? Eat or drink low-fat dairy products, such as skim milk or low-fat yogurt. ? Avoid fatty cuts of meat, processed or cured meats, and poultry with skin. Fill about one fourth of your plate with lean proteins, such as fish, chicken without skin, beans, eggs, or tofu. ? Avoid pre-made and processed foods. These tend to be higher in sodium, added sugar, and fat.  Reduce your daily sodium intake. Most people with hypertension should eat less than 1,500 mg of sodium a day.  Do not drink alcohol if: ? Your health care provider tells you not to drink. ? You are pregnant, may be pregnant, or are planning to become pregnant.  If you drink alcohol: ? Limit how much you use to:  0-1 drink a day for women.  0-2 drinks a day for men. ? Be aware of how much alcohol is in your drink. In the [place] one drink equals one 12 oz bottle of beer (355 mL), one 5 oz glass of wine (148 mL), or one 1 oz glass of hard liquor (44 mL). Lifestyle   Work with your health care provider to maintain a healthy body weight or to lose weight. Ask what an ideal weight is for you.  Get at least 30 minutes of exercise most days of the week. Activities may include walking, swimming, or biking.  Include exercise to strengthen your muscles (resistance exercise),  such as Pilates or lifting weights, as part of your weekly exercise routine. Try to do these types of exercises for 30 minutes at least 3 days a  week.  Do not use any products that contain nicotine or tobacco, such as cigarettes, e-cigarettes, and chewing tobacco. If you need help quitting, ask your health care provider.  Monitor your blood pressure at home as told by your health care provider.  Keep all follow-up visits as told by your health care provider. This is important. Medicines  Take over-the-counter and prescription medicines only as told by your health care provider. Follow directions carefully. Blood pressure medicines must be taken as prescribed.  Do not skip doses of blood pressure medicine. Doing this puts you at risk for problems and can make the medicine less effective.  Ask your health care provider about side effects or reactions to medicines that you should watch for. Contact a health care provider if you:  Think you are having a reaction to a medicine you are taking.  Have headaches that keep coming back (recurring).  Feel dizzy.  Have swelling in your ankles.  Have trouble with your vision. Get help right away if you:  Develop a severe headache or confusion.  Have unusual weakness or numbness.  Feel faint.  Have severe pain in your chest or abdomen.  Vomit repeatedly.  Have trouble breathing. Summary  Hypertension is when the force of blood pumping through your arteries is too strong. If this condition is not controlled, it may put you at risk for serious complications.  Your personal target blood pressure may vary depending on your medical conditions, your age, and other factors. For most people, a normal blood pressure is less than 120/80.  Hypertension is treated with lifestyle changes, medicines, or a combination of both. Lifestyle changes include losing weight, eating a healthy, low-sodium diet, exercising more, and limiting alcohol. This  information is not intended to replace advice given to you by your health care provider. Make sure you discuss any questions you have with your health care provider. Document Revised: 12/30/2017 Document Reviewed: 12/30/2017 Elsevier Patient Education  2020 [employer].

## 2019-09-06 NOTE — Addendum Note (Signed)
Addended by: Alfredia Ferguson A on: 09/06/2019 10:03 AM   Modules accepted: Orders

## 2019-09-06 NOTE — Progress Notes (Signed)
Benjamin Willis. 74 y.o.   Chief Complaint  Patient presents with  . Diabetes    follow up 3 months  . Hypertension  Last visit's assessment and plan 05/30/2019 ASSESSMENT & PLAN: Hypertension associated with type 2 diabetes mellitus (HCC) Systolic blood pressure elevated.  Will increase amlodipine to 10 mg daily continue lisinopril 40 mg daily. Uncontrolled diabetes with hemoglobin A1c at 7.7.  Patient not taking medications as initially prescribed.  Continue metformin 500 mg twice a day and start glipizide 5 mg with breakfast.  Diet and nutrition discussed. Follow-up in 3 months. HISTORY OF PRESENT ILLNESS: This is a 74 y.o. male with history of diabetes and hypertension here for follow-up and medication refill.  No complaints or medical concerns today. #1 diabetes: On Metformin 500 mg twice a day and glipizide 5 mg daily. Lab Results  Component Value Date   HGBA1C 7.7 (A) 05/30/2019  #2 hypertension: On amlodipine 5 mg daily and lisinopril 40 mg daily. BP Readings from Last 3 Encounters:  09/06/19 (!) 143/72  07/04/19 (!) 157/67  05/30/19 140/77  #3 dyslipidemia: On atorvastatin 20 mg daily Lab Results  Component Value Date   CHOL 127 05/30/2019   HDL 51 05/30/2019   LDLCALC 61 05/30/2019   TRIG 75 05/30/2019   CHOLHDL 2.5 05/30/2019     HPI   Prior to Admission medications   Medication Sig Start Date End Date Taking? Authorizing Provider  albuterol (VENTOLIN HFA) 108 (90 Base) MCG/ACT inhaler Inhale 2 puffs into the lungs every 6 (six) hours as needed for wheezing or shortness of breath. 01/19/19  Yes Icard, Octavio Graves, DO  atorvastatin (LIPITOR) 20 MG tablet Take 1 tablet by mouth once daily 06/12/19  Yes Lateasha Breuer, Ines Bloomer, MD  cetirizine (ZYRTEC) 10 MG tablet Take 1 tablet (10 mg total) by mouth daily. 09/03/17  Yes Jaynee Eagles, PA-C  Multiple Vitamins-Minerals (MULTIVITAMIN MEN) TABS Take 1 tablet by mouth daily.   Yes [provider]  amLODipine (NORVASC)  5 MG tablet Take 1 tablet (5 mg total) by mouth daily. 05/03/19 08/01/19  Horald Pollen, MD  blood glucose meter kit and supplies Per insurance preference. Check blood glucose once a day. Dx E11.9 04/18/19   Horald Pollen, MD  Blood Pressure Monitoring (BLOOD PRESSURE MONITOR/ARM) DEVI Check BP once a day. DX I 10 04/18/19   Horald Pollen, MD  glipiZIDE (GLUCOTROL) 5 MG tablet Take 1 tablet (5 mg total) by mouth daily before breakfast. 04/18/19 07/17/19  Horald Pollen, MD  lisinopril (ZESTRIL) 40 MG tablet Take 1 tablet (40 mg total) by mouth daily. 04/18/19 07/17/19  Horald Pollen, MD  metFORMIN (GLUCOPHAGE) 500 MG tablet Take 1 tablet (500 mg total) by mouth 2 (two) times daily with a meal. Take 1 tablet by mouth once daily with breakfast 04/18/19 07/17/19  Horald Pollen, MD    No Known Allergies  Patient Active Problem List   Diagnosis Date Noted  . 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 Lung nodule 02/02/2019  . Hypertension associated with type 2 diabetes mellitus ([place]) 05/24/2018  . Dyslipidemia 05/24/2018  . Dyslipidemia associated with type 2 diabetes mellitus ([place]) 06/09/2017    Past Medical History:  Diagnosis Date  . Allergy   . Diabetes mellitus without complication ([place])    Type II  . Hyperlipidemia   . Hypertension     Past Surgical History:  Procedure Laterality Date  . ELECTROMAGNETIC NAVIGATION BROCHOSCOPY  Right 02/02/2019   Procedure: VIDEO BRONCHOSCOPY WITH NAVIGATION AND FIDUCIAL PLACEMENT;  Surgeon: Garner Nash, DO;  Location: [place];  Service: Cardiopulmonary;  Laterality: Right;  . FUDUCIAL PLACEMENT Right 02/02/2019   Procedure: Placement Of Fuducial Right Upper Lobe;  Surgeon: Garner Nash, DO;  Location: [place];  Service: Cardiopulmonary;  Laterality: Right;  . none    . VIDEO BRONCHOSCOPY WITH RADIAL ENDOBRONCHIAL ULTRASOUND Right 02/02/2019   Procedure: Video Bronchoscopy With  Radial Endobronchial Ultrasound;  Surgeon: Garner Nash, DO;  Location: [facility];  Service: Cardiopulmonary;  Laterality: Right;    Social History   Socioeconomic History  . Marital status: Single    Spouse name: Not on file  . Number of children: 0  . Years of education: Not on file  . Highest education level: 9th grade  Occupational History  . Not on file  Tobacco Use  . Smoking status: Former Smoker    Packs/day: 1.00    Years: 48.00    Pack years: 48.00    Quit date: 02/20/2015    Years since quitting: 4.5  . Smokeless tobacco: Former Systems developer    Quit date: 10/2013  Substance and Sexual Activity  . Alcohol use: No  . Drug use: No  . Sexual activity: Yes    Birth control/protection: Condom  Other Topics Concern  . Not on file  Social History Narrative  . Not on file   Social Determinants of Health   Financial Resource Strain:   . Difficulty of Paying Living Expenses:   Food Insecurity:   . Worried About Charity fundraiser in the Last Year:   . Arboriculturist in the Last Year:   Transportation Needs:   . Film/video editor (Medical):   Marland Kitchen Lack of Transportation (Non-Medical):   Physical Activity:   . Days of Exercise per Week:   . Minutes of Exercise per Session:   Stress:   . Feeling of Stress :   Social Connections:   . Frequency of Communication with Friends and Family:   . Frequency of Social Gatherings with Friends and Family:   . Attends Religious Services:   . Active Member of Clubs or Organizations:   . Attends Archivist Meetings:   Marland Kitchen Marital Status:   Intimate Partner Violence:   . Fear of Current or Ex-Partner:   . Emotionally Abused:   Marland Kitchen Physically Abused:   . Sexually Abused:     Family History  Problem Relation Age of Onset  . Lung cancer Neg Hx      Review of Systems  Constitutional: Negative.  Negative for chills and fever.  HENT: Negative for congestion and sore throat.   Respiratory: Negative.  Negative for cough  and shortness of breath.   Cardiovascular: Negative.  Negative for chest pain and palpitations.  Gastrointestinal: Negative for abdominal pain, diarrhea, nausea and vomiting.  Musculoskeletal: Negative.  Negative for myalgias and neck pain.  Skin: Negative.  Negative for rash.  Neurological: Negative.  Negative for dizziness and headaches.  All other systems reviewed and are negative.  Today's Vitals   09/06/19 0825  BP: (!) 143/72  Pulse: (!) 59  Resp: 16  Temp: 98.3 F (36.8 C)  TempSrc: Temporal  SpO2: 100%  Weight: 159 lb (72.1 kg)  Height: '5\' 6"'$  (1.676 m)   Body mass index is 25.66 kg/m.   Physical Exam Vitals reviewed.  Constitutional:      Appearance: Normal appearance.  HENT:  Head: Normocephalic.  Eyes:     Extraocular Movements: Extraocular movements intact.     Pupils: Pupils are equal, round, and reactive to light.  Cardiovascular:     Rate and Rhythm: Normal rate and regular rhythm.     Pulses: Normal pulses.     Heart sounds: Normal heart sounds.  Pulmonary:     Effort: Pulmonary effort is normal.     Breath sounds: Normal breath sounds.  Musculoskeletal:        General: Normal range of motion.     Cervical back: Normal range of motion and neck supple.  Skin:    General: Skin is warm and dry.  Neurological:     General: No focal deficit present.     Mental Status: He is alert and oriented to person, place, and time.  Psychiatric:        Mood and Affect: Mood normal.        Behavior: Behavior normal.    Results for orders placed or performed in visit on 09/06/19 (from the past 24 hour(s))  POCT glucose (manual entry)     Status: Abnormal   Collection Time: 09/06/19  9:06 AM  Result Value Ref Range   POC Glucose 142 (A) 70 - 99 mg/dl  POCT glycosylated hemoglobin (Hb A1C)     Status: Abnormal   Collection Time: 09/06/19  9:11 AM  Result Value Ref Range   Hemoglobin A1C 7.3 (A) 4.0 - 5.6 %   HbA1c POC (<> result, manual entry)     HbA1c,  POC (prediabetic range)     HbA1c, POC (controlled diabetic range)      A total of 35 minutes was spent with the patient, greater than 50% of which was in counseling/coordination of care regarding diabetes and hypertension, management, cardiovascular risks associated with these conditions, review of all medications and changes, review of most recent blood work including today's hemoglobin A1c, review of most recent office visit notes, diet and nutrition, prognosis and need for follow-up in 3 months..   ASSESSMENT & PLAN: Hypertension associated with type 2 diabetes mellitus ([place]) Well-controlled hypertension.  Continue present medication.  No changes. Hemoglobin A1c better than before at 7.3.  Continue metformin 500 mg twice a day and increase glipizide to 5 mg twice a day. Follow-up in 3 months. Continue statin therapy.  Benjamin Willis was seen today for diabetes and hypertension.  Diagnoses and all orders for this visit:  Hypertension associated with type 2 diabetes mellitus ([place]) -     HM Diabetes Foot Exam -     POCT glucose (manual entry) -     POCT glycosylated hemoglobin (Hb A1C) -     Comprehensive metabolic panel -     glipiZIDE (GLUCOTROL) 5 MG tablet; Take 1 tablet (5 mg total) by mouth 2 (two) times daily with a meal.  Dyslipidemia associated with type 2 diabetes mellitus (HCC) -     Lipid panel  Dyslipidemia -     atorvastatin (LIPITOR) 20 MG tablet; Take 1 tablet (20 mg total) by mouth daily.  Essential hypertension -     lisinopril (ZESTRIL) 40 MG tablet; Take 1 tablet (40 mg total) by mouth daily.  Screening for colon cancer -     Cologuard    Patient Instructions       If you have lab work done today you will be contacted with your lab results within the next 2 weeks.  If you have not heard from [place] then please contact  us. The fastest way to get your results is to register for My Chart.   IF you received an x-ray today, you will receive an invoice from  [facility]. Please contact [facility] at [phone] with questions or concerns regarding your invoice.   IF you received labwork today, you will receive an invoice from [facility]. Please contact [facility] at [phone] with questions or concerns regarding your invoice.   Our billing staff will not be able to assist you with questions regarding bills from these companies.  You will be contacted with the lab results as soon as they are available. The fastest way to get your results is to activate your My Chart account. Instructions are located on the last page of this paperwork. If you have not heard from [place] regarding the results in 2 weeks, please contact this office.     Diabetes Mellitus and Nutrition, Adult When you have diabetes (diabetes mellitus), it is very important to have healthy eating habits because your blood sugar (glucose) levels are greatly affected by what you eat and drink. Eating healthy foods in the appropriate amounts, at about the same times every day, can help you:  Control your blood glucose.  Lower your risk of heart disease.  Improve your blood pressure.  Reach or maintain a healthy weight. Every person with diabetes is different, and each person has different needs for a meal plan. Your health care provider may recommend that you work with a diet and nutrition specialist (dietitian) to make a meal plan that is best for you. Your meal plan may vary depending on factors such as:  The calories you need.  The medicines you take.  Your weight.  Your blood glucose, blood pressure, and cholesterol levels.  Your activity level.  Other health conditions you have, such as heart or kidney disease. How do carbohydrates affect me? Carbohydrates, also called carbs, affect your blood glucose level more than any other type of food. Eating carbs naturally raises the amount of glucose in your blood. Carb counting is a method for keeping track of how  many carbs you eat. Counting carbs is important to keep your blood glucose at a healthy level, especially if you use insulin or take certain oral diabetes medicines. It is important to know how many carbs you can safely have in each meal. This is different for every person. Your dietitian can help you calculate how many carbs you should have at each meal and for each snack. Foods that contain carbs include:  Bread, cereal, rice, pasta, and crackers.  Potatoes and corn.  Peas, beans, and lentils.  Milk and yogurt.  Fruit and juice.  Desserts, such as cakes, cookies, ice cream, and candy. How does alcohol affect me? Alcohol can cause a sudden decrease in blood glucose (hypoglycemia), especially if you use insulin or take certain oral diabetes medicines. Hypoglycemia can be a life-threatening condition. Symptoms of hypoglycemia (sleepiness, dizziness, and confusion) are similar to symptoms of having too much alcohol. If your health care provider says that alcohol is safe for you, follow these guidelines:  Limit alcohol intake to no more than 1 drink per day for nonpregnant women and 2 drinks per day for men. One drink equals 12 oz of beer, 5 oz of wine, or 1 oz of hard liquor.  Do not drink on an empty stomach.  Keep yourself hydrated with water, diet soda, or unsweetened iced tea.  Keep in mind that regular soda, juice, and other mixers may contain  a lot of sugar and must be counted as carbs. What are tips for following this plan?  Reading food labels  Start by checking the serving size on the "Nutrition Facts" label of packaged foods and drinks. The amount of calories, carbs, fats, and other nutrients listed on the label is based on one serving of the item. Many items contain more than one serving per package.  Check the total grams (g) of carbs in one serving. You can calculate the number of servings of carbs in one serving by dividing the total carbs by 15. For example, if a food  has 30 g of total carbs, it would be equal to 2 servings of carbs.  Check the number of grams (g) of saturated and trans fats in one serving. Choose foods that have low or no amount of these fats.  Check the number of milligrams (mg) of salt (sodium) in one serving. Most people should limit total sodium intake to less than 2,300 mg per day.  Always check the nutrition information of foods labeled as "low-fat" or "nonfat". These foods may be higher in added sugar or refined carbs and should be avoided.  Talk to your dietitian to identify your daily goals for nutrients listed on the label. Shopping  Avoid buying canned, premade, or processed foods. These foods tend to be high in fat, sodium, and added sugar.  Shop around the outside edge of the grocery store. This includes fresh fruits and vegetables, bulk grains, fresh meats, and fresh dairy. Cooking  Use low-heat cooking methods, such as baking, instead of high-heat cooking methods like deep frying.  Cook using healthy oils, such as olive, canola, or sunflower oil.  Avoid cooking with butter, cream, or high-fat meats. Meal planning  Eat meals and snacks regularly, preferably at the same times every day. Avoid going long periods of time without eating.  Eat foods high in fiber, such as fresh fruits, vegetables, beans, and whole grains. Talk to your dietitian about how many servings of carbs you can eat at each meal.  Eat 4-6 ounces (oz) of lean protein each day, such as lean meat, chicken, fish, eggs, or tofu. One oz of lean protein is equal to: ? 1 oz of meat, chicken, or fish. ? 1 egg. ?  cup of tofu.  Eat some foods each day that contain healthy fats, such as avocado, nuts, seeds, and fish. Lifestyle  Check your blood glucose regularly.  Exercise regularly as told by your health care provider. This may include: ? 150 minutes of moderate-intensity or vigorous-intensity exercise each week. This could be brisk walking, biking,  or water aerobics. ? Stretching and doing strength exercises, such as yoga or weightlifting, at least 2 times a week.  Take medicines as told by your health care provider.  Do not use any products that contain nicotine or tobacco, such as cigarettes and e-cigarettes. If you need help quitting, ask your health care provider.  Work with a Social worker or diabetes educator to identify strategies to manage stress and any emotional and social challenges. Questions to ask a health care provider  Do I need to meet with a diabetes educator?  Do I need to meet with a dietitian?  What number can I call if I have questions?  When are the best times to check my blood glucose? Where to find more information:  American Diabetes Association: diabetes.org  Academy of Nutrition and Dietetics: www.eatright.[employer] of Diabetes and Digestive and Kidney Diseases (NIH):  [employer] Summary  A healthy meal plan will help you control your blood glucose and maintain a healthy lifestyle.  Working with a diet and nutrition specialist (dietitian) can help you make a meal plan that is best for you.  Keep in mind that carbohydrates (carbs) and alcohol have immediate effects on your blood glucose levels. It is important to count carbs and to use alcohol carefully. This information is not intended to replace advice given to you by your health care provider. Make sure you discuss any questions you have with your health care provider. Document Revised: 04/03/2017 Document Reviewed: 05/26/2016 Elsevier Patient Education  [place].  Hypertension, Adult High blood pressure (hypertension) is when the force of blood pumping through the arteries is too strong. The arteries are the blood vessels that carry blood from the heart throughout the body. Hypertension forces the heart to work harder to pump blood and may cause arteries to become narrow or stiff. Untreated or uncontrolled hypertension  can cause a heart attack, heart failure, a stroke, kidney disease, and other problems. A blood pressure reading consists of a higher number over a lower number. Ideally, your blood pressure should be below 120/80. The first ("top") number is called the systolic pressure. It is a measure of the pressure in your arteries as your heart beats. The second ("bottom") number is called the diastolic pressure. It is a measure of the pressure in your arteries as the heart relaxes. What are the causes? The exact cause of this condition is not known. There are some conditions that result in or are related to high blood pressure. What increases the risk? Some risk factors for high blood pressure are under your control. The following factors may make you more likely to develop this condition:  Smoking.  Having type 2 diabetes mellitus, high cholesterol, or both.  Not getting enough exercise or physical activity.  Being overweight.  Having too much fat, sugar, calories, or salt (sodium) in your diet.  Drinking too much alcohol. Some risk factors for high blood pressure may be difficult or impossible to change. Some of these factors include:  Having chronic kidney disease.  Having a family history of high blood pressure.  Age. Risk increases with age.  Race. You may be at higher risk if you are African American.  Gender. Men are at higher risk than women before age 29. After age 84, women are at higher risk than men.  Having obstructive sleep apnea.  Stress. What are the signs or symptoms? High blood pressure may not cause symptoms. Very high blood pressure (hypertensive crisis) may cause:  Headache.  Anxiety.  Shortness of breath.  Nosebleed.  Nausea and vomiting.  Vision changes.  Severe chest pain.  Seizures. How is this diagnosed? This condition is diagnosed by measuring your blood pressure while you are seated, with your arm resting on a flat surface, your legs uncrossed, and  your feet flat on the floor. The cuff of the blood pressure monitor will be placed directly against the skin of your upper arm at the level of your heart. It should be measured at least twice using the same arm. Certain conditions can cause a difference in blood pressure between your right and left arms. Certain factors can cause blood pressure readings to be lower or higher than normal for a short period of time:  When your blood pressure is higher when you are in a health care provider's office than when you are at home, this is  called white coat hypertension. Most people with this condition do not need medicines.  When your blood pressure is higher at home than when you are in a health care provider's office, this is called masked hypertension. Most people with this condition may need medicines to control blood pressure. If you have a high blood pressure reading during one visit or you have normal blood pressure with other risk factors, you may be asked to:  Return on a different day to have your blood pressure checked again.  Monitor your blood pressure at home for 1 week or longer. If you are diagnosed with hypertension, you may have other blood or imaging tests to help your health care provider understand your overall risk for other conditions. How is this treated? This condition is treated by making healthy lifestyle changes, such as eating healthy foods, exercising more, and reducing your alcohol intake. Your health care provider may prescribe medicine if lifestyle changes are not enough to get your blood pressure under control, and if:  Your systolic blood pressure is above 130.  Your diastolic blood pressure is above 80. Your personal target blood pressure may vary depending on your medical conditions, your age, and other factors. Follow these instructions at home: Eating and drinking   Eat a diet that is high in fiber and potassium, and low in sodium, added sugar, and fat. An  example eating plan is called the DASH (Dietary Approaches to Stop Hypertension) diet. To eat this way: ? Eat plenty of fresh fruits and vegetables. Try to fill one half of your plate at each meal with fruits and vegetables. ? Eat whole grains, such as whole-wheat pasta, brown rice, or whole-grain bread. Fill about one fourth of your plate with whole grains. ? Eat or drink low-fat dairy products, such as skim milk or low-fat yogurt. ? Avoid fatty cuts of meat, processed or cured meats, and poultry with skin. Fill about one fourth of your plate with lean proteins, such as fish, chicken without skin, beans, eggs, or tofu. ? Avoid pre-made and processed foods. These tend to be higher in sodium, added sugar, and fat.  Reduce your daily sodium intake. Most people with hypertension should eat less than 1,500 mg of sodium a day.  Do not drink alcohol if: ? Your health care provider tells you not to drink. ? You are pregnant, may be pregnant, or are planning to become pregnant.  If you drink alcohol: ? Limit how much you use to:  0-1 drink a day for women.  0-2 drinks a day for men. ? Be aware of how much alcohol is in your drink. In the [place] one drink equals one 12 oz bottle of beer (355 mL), one 5 oz glass of wine (148 mL), or one 1 oz glass of hard liquor (44 mL). Lifestyle   Work with your health care provider to maintain a healthy body weight or to lose weight. Ask what an ideal weight is for you.  Get at least 30 minutes of exercise most days of the week. Activities may include walking, swimming, or biking.  Include exercise to strengthen your muscles (resistance exercise), such as Pilates or lifting weights, as part of your weekly exercise routine. Try to do these types of exercises for 30 minutes at least 3 days a week.  Do not use any products that contain nicotine or tobacco, such as cigarettes, e-cigarettes, and chewing tobacco. If you need help quitting, ask your health care  provider.  Monitor your  blood pressure at home as told by your health care provider.  Keep all follow-up visits as told by your health care provider. This is important. Medicines  Take over-the-counter and prescription medicines only as told by your health care provider. Follow directions carefully. Blood pressure medicines must be taken as prescribed.  Do not skip doses of blood pressure medicine. Doing this puts you at risk for problems and can make the medicine less effective.  Ask your health care provider about side effects or reactions to medicines that you should watch for. Contact a health care provider if you:  Think you are having a reaction to a medicine you are taking.  Have headaches that keep coming back (recurring).  Feel dizzy.  Have swelling in your ankles.  Have trouble with your vision. Get help right away if you:  Develop a severe headache or confusion.  Have unusual weakness or numbness.  Feel faint.  Have severe pain in your chest or abdomen.  Vomit repeatedly.  Have trouble breathing. Summary  Hypertension is when the force of blood pumping through your arteries is too strong. If this condition is not controlled, it may put you at risk for serious complications.  Your personal target blood pressure may vary depending on your medical conditions, your age, and other factors. For most people, a normal blood pressure is less than 120/80.  Hypertension is treated with lifestyle changes, medicines, or a combination of both. Lifestyle changes include losing weight, eating a healthy, low-sodium diet, exercising more, and limiting alcohol. This information is not intended to replace advice given to you by your health care provider. Make sure you discuss any questions you have with your health care provider. Document Revised: 12/30/2017 Document Reviewed: 12/30/2017 Elsevier Patient Education  2020 Elsevier Inc.      Agustina Caroli, MD [facility]

## 2019-09-06 NOTE — Telephone Encounter (Signed)
Faxed Cologuard Req to [employer]. Confirmation page 12:31 pm.

## 2019-09-07 ENCOUNTER — Telehealth: Payer: Self-pay

## 2019-09-07 NOTE — Telephone Encounter (Signed)
-----   Message from [facility], MD sent at 09/07/2019  8:16 AM EDT ----- Call patient please.  Normal labs.  Thanks.

## 2019-09-07 NOTE — Telephone Encounter (Signed)
Spoke with patient's sister , verified on release of information sheet. She was informed per drs normal lab results.

## 2019-09-13 ENCOUNTER — Encounter: Payer: Self-pay | Admitting: Emergency Medicine

## 2019-09-13 ENCOUNTER — Other Ambulatory Visit: Payer: Self-pay

## 2019-09-13 ENCOUNTER — Telehealth (INDEPENDENT_AMBULATORY_CARE_PROVIDER_SITE_OTHER): Payer: Medicare Other | Admitting: Emergency Medicine

## 2019-09-13 VITALS — BP 113/52 | Ht 66.0 in | Wt 180.0 lb

## 2019-09-13 DIAGNOSIS — J069 Acute upper respiratory infection, unspecified: Secondary | ICD-10-CM | POA: Diagnosis not present

## 2019-09-13 DIAGNOSIS — J989 Respiratory disorder, unspecified: Secondary | ICD-10-CM | POA: Diagnosis not present

## 2019-09-13 DIAGNOSIS — R0989 Other specified symptoms and signs involving the circulatory and respiratory systems: Secondary | ICD-10-CM | POA: Diagnosis not present

## 2019-09-13 MED ORDER — DM-GUAIFENESIN ER 30-600 MG PO TB12: 1.0000 | ORAL_TABLET | Freq: Two times a day (BID) | ORAL | 0 refills | Status: AC

## 2019-09-13 MED ORDER — AZITHROMYCIN 250 MG PO TABS: ORAL_TABLET | ORAL | 0 refills | Status: DC

## 2019-09-13 NOTE — Patient Instructions (Signed)
° ° ° °  If you have lab work done today you will be contacted with your lab results within the next 2 weeks.  If you have not heard from us then please contact us. The fastest way to get your results is to register for My Chart. ° ° °IF you received an x-ray today, you will receive an invoice from [facility]. Please contact [facility] at [phone] with questions or concerns regarding your invoice.  ° °IF you received labwork today, you will receive an invoice from [facility]. Please contact [facility] at [phone] with questions or concerns regarding your invoice.  ° °Our billing staff will not be able to assist you with questions regarding bills from these companies. ° °You will be contacted with the lab results as soon as they are available. The fastest way to get your results is to activate your My Chart account. Instructions are located on the last page of this paperwork. If you have not heard from us regarding the results in 2 weeks, please contact this office. °  ° ° ° °

## 2019-09-13 NOTE — Progress Notes (Signed)
Telemedicine Encounter- SOAP NOTE Established Patient MyChart video encounter unsuccessful. This telephone encounter was conducted with the patient's (or proxy's) verbal consent via audio telecommunications: yes/no: Yes Patient was instructed to have this encounter in a suitably private space; and to only have persons present to whom they give permission to participate. In addition, patient identity was confirmed by use of name plus two identifiers (DOB and address).  I discussed the limitations, risks, security and privacy concerns of performing an evaluation and management service by telephone and the availability of in person appointments. I also discussed with the patient that there may be a patient responsible charge related to this service. The patient expressed understanding and agreed to proceed.  I spent a total of TIME; 0 MIN TO 60 MIN: 15 minutes talking with the patient or their proxy.  Chief Complaint  Patient presents with  . Nasal Congestion    pt mowed grass at sisters about 1 week ago with drainage, cough, dizzyness pt denies contact with anyone that would have [place]. is a 74 y.o. male established patient. Telephone visit today complaining of cough with increased phlegm, sneezing that started about a week ago.  Also complaining of nasal congestion.  Denies fever or chills.  Denies difficulty breathing.  Denies nausea or vomiting.  No other significant symptoms.  HPI   Patient Active Problem List   Diagnosis Date Noted  . Primary cancer of right upper lobe of lung ([place]) 02/07/2019  . Lung nodule 02/02/2019  . Hypertension associated with type 2 diabetes mellitus ([place]) 05/24/2018  . Dyslipidemia 05/24/2018  . Dyslipidemia associated with type 2 diabetes mellitus ([place]) 06/09/2017    Past Medical History:  Diagnosis Date  . Allergy   . Diabetes mellitus without complication ([place])    Type II  . Hyperlipidemia   . Hypertension      Current Outpatient Medications  Medication Sig Dispense Refill  . albuterol (VENTOLIN HFA) 108 (90 Base) MCG/ACT inhaler Inhale 2 puffs into the lungs every 6 (six) hours as needed for wheezing or shortness of breath. 18 g 6  . atorvastatin (LIPITOR) 20 MG tablet Take 1 tablet (20 mg total) by mouth daily. 90 tablet 3  . blood glucose meter kit and supplies Per insurance preference. Check blood glucose once a day. Dx E11.9 1 each 3  . Blood Pressure Monitoring (BLOOD PRESSURE MONITOR/ARM) DEVI Check BP once a day. DX I 10 1 each 0  . cetirizine (ZYRTEC) 10 MG tablet Take 1 tablet (10 mg total) by mouth daily. 30 tablet 11  . glipiZIDE (GLUCOTROL) 5 MG tablet Take 1 tablet (5 mg total) by mouth 2 (two) times daily with a meal. 180 tablet 3  . lisinopril (ZESTRIL) 40 MG tablet Take 1 tablet (40 mg total) by mouth daily. 90 tablet 3  . Multiple Vitamins-Minerals (MULTIVITAMIN MEN) TABS Take 1 tablet by mouth daily.    Marland Kitchen amLODipine (NORVASC) 5 MG tablet Take 1 tablet (5 mg total) by mouth daily. 90 tablet 3  . metFORMIN (GLUCOPHAGE) 500 MG tablet Take 1 tablet (500 mg total) by mouth 2 (two) times daily with a meal. Take 1 tablet by mouth once daily with breakfast 180 tablet 1   No current facility-administered medications for this visit.    No Known Allergies  Social History   Socioeconomic History  . Marital status: Single    Spouse name: Not on file  . Number of  children: 0  . Years of education: Not on file  . Highest education level: 9th grade  Occupational History  . Not on file  Tobacco Use  . Smoking status: Former Smoker    Packs/day: 1.00    Years: 48.00    Pack years: 48.00    Quit date: 02/20/2015    Years since quitting: 4.5  . Smokeless tobacco: Former Systems developer    Quit date: 10/2013  Substance and Sexual Activity  . Alcohol use: No  . Drug use: No  . Sexual activity: Yes    Birth control/protection: Condom  Other Topics Concern  . Not on file  Social History  Narrative  . Not on file   Social Determinants of Health   Financial Resource Strain:   . Difficulty of Paying Living Expenses:   Food Insecurity:   . Worried About Charity fundraiser in the Last Year:   . Arboriculturist in the Last Year:   Transportation Needs:   . Film/video editor (Medical):   Marland Kitchen Lack of Transportation (Non-Medical):   Physical Activity:   . Days of Exercise per Week:   . Minutes of Exercise per Session:   Stress:   . Feeling of Stress :   Social Connections:   . Frequency of Communication with Friends and Family:   . Frequency of Social Gatherings with Friends and Family:   . Attends Religious Services:   . Active Member of Clubs or Organizations:   . Attends Archivist Meetings:   Marland Kitchen Marital Status:   Intimate Partner Violence:   . Fear of Current or Ex-Partner:   . Emotionally Abused:   Marland Kitchen Physically Abused:   . Sexually Abused:     ROS  Objective  Alert and oriented x3 in no apparent respiratory therapy Vitals as reported by the patient: Today's Vitals   09/13/19 1613  BP: (!) 113/52  Weight: 180 lb (81.6 kg)  Height: '5\' 6"'$  (1.676 m)    There are no diagnoses linked to this encounter.  Wenzel was seen today for nasal congestion.  Diagnoses and all orders for this visit:  Chest congestion -     dextromethorphan-guaiFENesin (MUCINEX DM) 30-600 MG 12hr tablet; Take 1 tablet by mouth 2 (two) times daily for 7 days.  Respiratory illness -     azithromycin (ZITHROMAX) 250 MG tablet; Sig as indicated  Acute upper respiratory infection   Clinically stable.  No red flag signs or symptoms.  ED precautions given. Follow-up in the office if not better in the next several days. I discussed the assessment and treatment plan with the patient. The patient was provided an opportunity to ask questions and all were answered. The patient agreed with the plan and demonstrated an understanding of the instructions.   The patient was advised  to call back or seek an in-person evaluation if the symptoms worsen or if the condition fails to improve as anticipated.  I provided 15 minutes of non-face-to-face time during this encounter.  Horald Pollen, MD  Primary Care at [facility]

## 2019-11-14 NOTE — Progress Notes (Signed)
Sent letter to patient about returning their cologuard kit.

## 2019-12-06 ENCOUNTER — Other Ambulatory Visit: Payer: Self-pay

## 2019-12-06 ENCOUNTER — Ambulatory Visit (INDEPENDENT_AMBULATORY_CARE_PROVIDER_SITE_OTHER): Payer: Medicare Other | Admitting: Emergency Medicine

## 2019-12-06 ENCOUNTER — Encounter: Payer: Self-pay | Admitting: Emergency Medicine

## 2019-12-06 VITALS — BP 158/74 | HR 67 | Temp 98.4°F | Resp 15 | Ht 66.0 in | Wt 161.8 lb

## 2019-12-06 DIAGNOSIS — E1169 Type 2 diabetes mellitus with other specified complication: Secondary | ICD-10-CM

## 2019-12-06 DIAGNOSIS — I10 Essential (primary) hypertension: Secondary | ICD-10-CM

## 2019-12-06 DIAGNOSIS — C3411 Malignant neoplasm of upper lobe, right bronchus or lung: Secondary | ICD-10-CM | POA: Diagnosis not present

## 2019-12-06 DIAGNOSIS — E1165 Type 2 diabetes mellitus with hyperglycemia: Secondary | ICD-10-CM

## 2019-12-06 DIAGNOSIS — E785 Hyperlipidemia, unspecified: Secondary | ICD-10-CM

## 2019-12-06 DIAGNOSIS — E1159 Type 2 diabetes mellitus with other circulatory complications: Secondary | ICD-10-CM

## 2019-12-06 LAB — POCT GLYCOSYLATED HEMOGLOBIN (HGB A1C): Hemoglobin A1C: 8.4 % — AB (ref 4.0–5.6)

## 2019-12-06 LAB — GLUCOSE, POCT (MANUAL RESULT ENTRY): POC Glucose: 149 mg/dl — AB (ref 70–99)

## 2019-12-06 MED ORDER — AMLODIPINE BESYLATE 10 MG PO TABS: 10.0000 mg | ORAL_TABLET | Freq: Every day | ORAL | 3 refills | Status: DC

## 2019-12-06 MED ORDER — SITAGLIPTIN PHOSPHATE 50 MG PO TABS: 50.0000 mg | ORAL_TABLET | Freq: Every day | ORAL | 3 refills | Status: DC

## 2019-12-06 MED ORDER — LISINOPRIL 40 MG PO TABS: 40.0000 mg | ORAL_TABLET | Freq: Every day | ORAL | 3 refills | Status: DC

## 2019-12-06 NOTE — Patient Instructions (Addendum)
Continue lisinopril 40 mg daily.  Continue amlodipine at increased dose of 10 mg daily.  New prescription sent. Continue Metformin and glipizide.  Start Januvia 50 mg daily. Follow-up in 3 months.     If you have lab work done today you will be contacted with your lab results within the next 2 weeks.  If you have not heard from [place] then please contact us. The fastest way to get your results is to register for My Chart.   IF you received an x-ray today, you will receive an invoice from [facility]. Please contact [facility] at [phone] with questions or concerns regarding your invoice.   IF you received labwork today, you will receive an invoice from [place]. Please contact [facility] at [phone] with questions or concerns regarding your invoice.   Our billing staff will not be able to assist you with questions regarding bills from these companies.  You will be contacted with the lab results as soon as they are available. The fastest way to get your results is to activate your My Chart account. Instructions are located on the last page of this paperwork. If you have not heard from [place] regarding the results in 2 weeks, please contact this office.     Hypertension, Adult High blood pressure (hypertension) is when the force of blood pumping through the arteries is too strong. The arteries are the blood vessels that carry blood from the heart throughout the body. Hypertension forces the heart to work harder to pump blood and may cause arteries to become narrow or stiff. Untreated or uncontrolled hypertension can cause a heart attack, heart failure, a stroke, kidney disease, and other problems. A blood pressure reading consists of a higher number over a lower number. Ideally, your blood pressure should be below 120/80. The first ("top") number is called the systolic pressure. It is a measure of the pressure in your arteries as your heart beats. The second ("bottom") number is  called the diastolic pressure. It is a measure of the pressure in your arteries as the heart relaxes. What are the causes? The exact cause of this condition is not known. There are some conditions that result in or are related to high blood pressure. What increases the risk? Some risk factors for high blood pressure are under your control. The following factors may make you more likely to develop this condition:  Smoking.  Having type 2 diabetes mellitus, high cholesterol, or both.  Not getting enough exercise or physical activity.  Being overweight.  Having too much fat, sugar, calories, or salt (sodium) in your diet.  Drinking too much alcohol. Some risk factors for high blood pressure may be difficult or impossible to change. Some of these factors include:  Having chronic kidney disease.  Having a family history of high blood pressure.  Age. Risk increases with age.  Race. You may be at higher risk if you are African American.  Gender. Men are at higher risk than women before age 23. After age 1, women are at higher risk than men.  Having obstructive sleep apnea.  Stress. What are the signs or symptoms? High blood pressure may not cause symptoms. Very high blood pressure (hypertensive crisis) may cause:  Headache.  Anxiety.  Shortness of breath.  Nosebleed.  Nausea and vomiting.  Vision changes.  Severe chest pain.  Seizures. How is this diagnosed? This condition is diagnosed by measuring your blood pressure while you are seated, with your arm resting on a flat surface,  your legs uncrossed, and your feet flat on the floor. The cuff of the blood pressure monitor will be placed directly against the skin of your upper arm at the level of your heart. It should be measured at least twice using the same arm. Certain conditions can cause a difference in blood pressure between your right and left arms. Certain factors can cause blood pressure readings to be lower or  higher than normal for a short period of time:  When your blood pressure is higher when you are in a health care provider's office than when you are at home, this is called white coat hypertension. Most people with this condition do not need medicines.  When your blood pressure is higher at home than when you are in a health care provider's office, this is called masked hypertension. Most people with this condition may need medicines to control blood pressure. If you have a high blood pressure reading during one visit or you have normal blood pressure with other risk factors, you may be asked to:  Return on a different day to have your blood pressure checked again.  Monitor your blood pressure at home for 1 week or longer. If you are diagnosed with hypertension, you may have other blood or imaging tests to help your health care provider understand your overall risk for other conditions. How is this treated? This condition is treated by making healthy lifestyle changes, such as eating healthy foods, exercising more, and reducing your alcohol intake. Your health care provider may prescribe medicine if lifestyle changes are not enough to get your blood pressure under control, and if:  Your systolic blood pressure is above 130.  Your diastolic blood pressure is above 80. Your personal target blood pressure may vary depending on your medical conditions, your age, and other factors. Follow these instructions at home: Eating and drinking   Eat a diet that is high in fiber and potassium, and low in sodium, added sugar, and fat. An example eating plan is called the DASH (Dietary Approaches to Stop Hypertension) diet. To eat this way: ? Eat plenty of fresh fruits and vegetables. Try to fill one half of your plate at each meal with fruits and vegetables. ? Eat whole grains, such as whole-wheat pasta, brown rice, or whole-grain bread. Fill about one fourth of your plate with whole grains. ? Eat or drink  low-fat dairy products, such as skim milk or low-fat yogurt. ? Avoid fatty cuts of meat, processed or cured meats, and poultry with skin. Fill about one fourth of your plate with lean proteins, such as fish, chicken without skin, beans, eggs, or tofu. ? Avoid pre-made and processed foods. These tend to be higher in sodium, added sugar, and fat.  Reduce your daily sodium intake. Most people with hypertension should eat less than 1,500 mg of sodium a day.  Do not drink alcohol if: ? Your health care provider tells you not to drink. ? You are pregnant, may be pregnant, or are planning to become pregnant.  If you drink alcohol: ? Limit how much you use to:  0-1 drink a day for women.  0-2 drinks a day for men. ? Be aware of how much alcohol is in your drink. In the [place] one drink equals one 12 oz bottle of beer (355 mL), one 5 oz glass of wine (148 mL), or one 1 oz glass of hard liquor (44 mL). Lifestyle   Work with your health care provider to maintain a  healthy body weight or to lose weight. Ask what an ideal weight is for you.  Get at least 30 minutes of exercise most days of the week. Activities may include walking, swimming, or biking.  Include exercise to strengthen your muscles (resistance exercise), such as Pilates or lifting weights, as part of your weekly exercise routine. Try to do these types of exercises for 30 minutes at least 3 days a week.  Do not use any products that contain nicotine or tobacco, such as cigarettes, e-cigarettes, and chewing tobacco. If you need help quitting, ask your health care provider.  Monitor your blood pressure at home as told by your health care provider.  Keep all follow-up visits as told by your health care provider. This is important. Medicines  Take over-the-counter and prescription medicines only as told by your health care provider. Follow directions carefully. Blood pressure medicines must be taken as prescribed.  Do not skip doses of  blood pressure medicine. Doing this puts you at risk for problems and can make the medicine less effective.  Ask your health care provider about side effects or reactions to medicines that you should watch for. Contact a health care provider if you:  Think you are having a reaction to a medicine you are taking.  Have headaches that keep coming back (recurring).  Feel dizzy.  Have swelling in your ankles.  Have trouble with your vision. Get help right away if you:  Develop a severe headache or confusion.  Have unusual weakness or numbness.  Feel faint.  Have severe pain in your chest or abdomen.  Vomit repeatedly.  Have trouble breathing. Summary  Hypertension is when the force of blood pumping through your arteries is too strong. If this condition is not controlled, it may put you at risk for serious complications.  Your personal target blood pressure may vary depending on your medical conditions, your age, and other factors. For most people, a normal blood pressure is less than 120/80.  Hypertension is treated with lifestyle changes, medicines, or a combination of both. Lifestyle changes include losing weight, eating a healthy, low-sodium diet, exercising more, and limiting alcohol. This information is not intended to replace advice given to you by your health care provider. Make sure you discuss any questions you have with your health care provider. Document Revised: 12/30/2017 Document Reviewed: 12/30/2017 Elsevier Patient Education  [place].  Diabetes Mellitus and Nutrition, Adult When you have diabetes (diabetes mellitus), it is very important to have healthy eating habits because your blood sugar (glucose) levels are greatly affected by what you eat and drink. Eating healthy foods in the appropriate amounts, at about the same times every day, can help you:  Control your blood glucose.  Lower your risk of heart disease.  Improve your blood  pressure.  Reach or maintain a healthy weight. Every person with diabetes is different, and each person has different needs for a meal plan. Your health care provider may recommend that you work with a diet and nutrition specialist (dietitian) to make a meal plan that is best for you. Your meal plan may vary depending on factors such as:  The calories you need.  The medicines you take.  Your weight.  Your blood glucose, blood pressure, and cholesterol levels.  Your activity level.  Other health conditions you have, such as heart or kidney disease. How do carbohydrates affect me? Carbohydrates, also called carbs, affect your blood glucose level more than any other type of food. Eating  carbs naturally raises the amount of glucose in your blood. Carb counting is a method for keeping track of how many carbs you eat. Counting carbs is important to keep your blood glucose at a healthy level, especially if you use insulin or take certain oral diabetes medicines. It is important to know how many carbs you can safely have in each meal. This is different for every person. Your dietitian can help you calculate how many carbs you should have at each meal and for each snack. Foods that contain carbs include:  Bread, cereal, rice, pasta, and crackers.  Potatoes and corn.  Peas, beans, and lentils.  Milk and yogurt.  Fruit and juice.  Desserts, such as cakes, cookies, ice cream, and candy. How does alcohol affect me? Alcohol can cause a sudden decrease in blood glucose (hypoglycemia), especially if you use insulin or take certain oral diabetes medicines. Hypoglycemia can be a life-threatening condition. Symptoms of hypoglycemia (sleepiness, dizziness, and confusion) are similar to symptoms of having too much alcohol. If your health care provider says that alcohol is safe for you, follow these guidelines:  Limit alcohol intake to no more than 1 drink per day for nonpregnant women and 2 drinks per  day for men. One drink equals 12 oz of beer, 5 oz of wine, or 1 oz of hard liquor.  Do not drink on an empty stomach.  Keep yourself hydrated with water, diet soda, or unsweetened iced tea.  Keep in mind that regular soda, juice, and other mixers may contain a lot of sugar and must be counted as carbs. What are tips for following this plan?  Reading food labels  Start by checking the serving size on the "Nutrition Facts" label of packaged foods and drinks. The amount of calories, carbs, fats, and other nutrients listed on the label is based on one serving of the item. Many items contain more than one serving per package.  Check the total grams (g) of carbs in one serving. You can calculate the number of servings of carbs in one serving by dividing the total carbs by 15. For example, if a food has 30 g of total carbs, it would be equal to 2 servings of carbs.  Check the number of grams (g) of saturated and trans fats in one serving. Choose foods that have low or no amount of these fats.  Check the number of milligrams (mg) of salt (sodium) in one serving. Most people should limit total sodium intake to less than 2,300 mg per day.  Always check the nutrition information of foods labeled as "low-fat" or "nonfat". These foods may be higher in added sugar or refined carbs and should be avoided.  Talk to your dietitian to identify your daily goals for nutrients listed on the label. Shopping  Avoid buying canned, premade, or processed foods. These foods tend to be high in fat, sodium, and added sugar.  Shop around the outside edge of the grocery store. This includes fresh fruits and vegetables, bulk grains, fresh meats, and fresh dairy. Cooking  Use low-heat cooking methods, such as baking, instead of high-heat cooking methods like deep frying.  Cook using healthy oils, such as olive, canola, or sunflower oil.  Avoid cooking with butter, cream, or high-fat meats. Meal planning  Eat  meals and snacks regularly, preferably at the same times every day. Avoid going long periods of time without eating.  Eat foods high in fiber, such as fresh fruits, vegetables, beans, and whole grains. Talk  to your dietitian about how many servings of carbs you can eat at each meal.  Eat 4-6 ounces (oz) of lean protein each day, such as lean meat, chicken, fish, eggs, or tofu. One oz of lean protein is equal to: ? 1 oz of meat, chicken, or fish. ? 1 egg. ?  cup of tofu.  Eat some foods each day that contain healthy fats, such as avocado, nuts, seeds, and fish. Lifestyle  Check your blood glucose regularly.  Exercise regularly as told by your health care provider. This may include: ? 150 minutes of moderate-intensity or vigorous-intensity exercise each week. This could be brisk walking, biking, or water aerobics. ? Stretching and doing strength exercises, such as yoga or weightlifting, at least 2 times a week.  Take medicines as told by your health care provider.  Do not use any products that contain nicotine or tobacco, such as cigarettes and e-cigarettes. If you need help quitting, ask your health care provider.  Work with a Social worker or diabetes educator to identify strategies to manage stress and any emotional and social challenges. Questions to ask a health care provider  Do I need to meet with a diabetes educator?  Do I need to meet with a dietitian?  What number can I call if I have questions?  When are the best times to check my blood glucose? Where to find more information:  American Diabetes Association: diabetes.org  Academy of Nutrition and Dietetics: www.eatright.[employer] of Diabetes and Digestive and Kidney Diseases (NIH): [employer] Summary  A healthy meal plan will help you control your blood glucose and maintain a healthy lifestyle.  Working with a diet and nutrition specialist (dietitian) can help you make a meal plan that is best for  you.  Keep in mind that carbohydrates (carbs) and alcohol have immediate effects on your blood glucose levels. It is important to count carbs and to use alcohol carefully. This information is not intended to replace advice given to you by your health care provider. Make sure you discuss any questions you have with your health care provider. Document Revised: 04/03/2017 Document Reviewed: 05/26/2016 Elsevier Patient Education  2020 [employer].

## 2019-12-06 NOTE — Progress Notes (Signed)
Benjamin Willis. 74 y.o.   Chief Complaint  Patient presents with  . Diabetes    pt has been has been working outside to help stay active and has been working to cut back sugars in his diet   . Hypertension    pt denies physical symptoms and has been having his BP checked about every 2 weeks is has been slightly elevated but not as high as todays in office pressure    ASSESSMENT & PLAN: Office visit 09/06/2019 Hypertension associated with type 2 diabetes mellitus ([place]) Well-controlled hypertension.  Continue present medication.  No changes. Hemoglobin A1c better than before at 7.3.  Continue metformin 500 mg twice a day and increase glipizide to 5 mg twice a day. Follow-up in 3 months. Continue statin therapy. HISTORY OF PRESENT ILLNESS: This is a 74 y.o. male with history of diabetes and hypertension here for follow-up. 1.  Diabetes: Presently taking Metformin 500 mg twice a day and glipizide 5 mg twice a day.  Blood glucose at home elevated. Lab Results  Component Value Date   HGBA1C 7.3 (A) 09/06/2019   2.  Hypertension: Presently taking lisinopril 40 mg daily and amlodipine 5 mg daily.  Blood pressure readings at home with elevated systolic pressure.  Normal renal function. BP Readings from Last 3 Encounters:  12/06/19 (!) 170/87  09/13/19 (!) 113/52  09/06/19 136/66  Asymptomatic.  No complaints.  Fully vaccinated against Covid.  HPI   Prior to Admission medications   Medication Sig Start Date End Date Taking? Authorizing Provider  albuterol (VENTOLIN HFA) 108 (90 Base) MCG/ACT inhaler Inhale 2 puffs into the lungs every 6 (six) hours as needed for wheezing or shortness of breath. 01/19/19  Yes Icard, Bradley L, DO  atorvastatin (LIPITOR) 20 MG tablet Take 1 tablet (20 mg total) by mouth daily. 09/06/19  Yes Horald Pollen, MD  azithromycin [facility] 2) 250 MG tablet Sig as indicated 09/13/19  Yes Nichols Corter, Ines Bloomer, MD  blood glucose meter kit and supplies Per  insurance preference. Check blood glucose once a day. Dx E11.9 04/18/19  Yes Horald Pollen, MD  Blood Pressure Monitoring (BLOOD PRESSURE MONITOR/ARM) DEVI Check BP once a day. DX I 10 04/18/19  Yes Julieanne Hadsall, Ines Bloomer, MD  cetirizine (ZYRTEC) 10 MG tablet Take 1 tablet (10 mg total) by mouth daily. 09/03/17  Yes Jaynee Eagles, PA-C  Multiple Vitamins-Minerals (MULTIVITAMIN MEN) TABS Take 1 tablet by mouth daily.   Yes [provider]  amLODipine (NORVASC) 5 MG tablet Take 1 tablet (5 mg total) by mouth daily. 05/03/19 08/01/19  Horald Pollen, MD  glipiZIDE (GLUCOTROL) 5 MG tablet Take 1 tablet (5 mg total) by mouth 2 (two) times daily with a meal. 09/06/19 12/05/19  Horald Pollen, MD  lisinopril (ZESTRIL) 40 MG tablet Take 1 tablet (40 mg total) by mouth daily. 09/06/19 12/05/19  Horald Pollen, MD  metFORMIN (GLUCOPHAGE) 500 MG tablet Take 1 tablet (500 mg total) by mouth 2 (two) times daily with a meal. Take 1 tablet by mouth once daily with breakfast 04/18/19 07/17/19  Horald Pollen, MD    No Known Allergies  Patient Active Problem List   Diagnosis Date Noted  . Primary cancer of right upper lobe of lung ([place]) 02/07/2019  . Lung nodule 02/02/2019  . Hypertension associated with type 2 diabetes mellitus ([place]) 05/24/2018  . Dyslipidemia 05/24/2018  . Dyslipidemia associated with type 2 diabetes mellitus ([place]) 06/09/2017    Past Medical History:  Diagnosis Date  . Allergy   . Diabetes mellitus without complication ([place])    Type II  . Hyperlipidemia   . Hypertension     Past Surgical History:  Procedure Laterality Date  . ELECTROMAGNETIC NAVIGATION BROCHOSCOPY Right 02/02/2019   Procedure: VIDEO BRONCHOSCOPY WITH NAVIGATION AND FIDUCIAL PLACEMENT;  Surgeon: Garner Nash, DO;  Location: [place];  Service: Cardiopulmonary;  Laterality: Right;  . FUDUCIAL PLACEMENT Right 02/02/2019   Procedure: Placement Of Fuducial Right Upper Lobe;  Surgeon:  Garner Nash, DO;  Location: [place];  Service: Cardiopulmonary;  Laterality: Right;  . none    . VIDEO BRONCHOSCOPY WITH RADIAL ENDOBRONCHIAL ULTRASOUND Right 02/02/2019   Procedure: Video Bronchoscopy With Radial Endobronchial Ultrasound;  Surgeon: Garner Nash, DO;  Location: [place];  Service: Cardiopulmonary;  Laterality: Right;    Social History   Socioeconomic History  . Marital status: Single    Spouse name: Not on file  . Number of children: 0  . Years of education: Not on file  . Highest education level: 9th grade  Occupational History  . Not on file  Tobacco Use  . Smoking status: Former Smoker    Packs/day: 1.00    Years: 48.00    Pack years: 48.00    Quit date: 02/20/2015    Years since quitting: 4.7  . Smokeless tobacco: Former Systems developer    Quit date: 10/2013  Vaping Use  . Vaping Use: Never used  Substance and Sexual Activity  . Alcohol use: No  . Drug use: No  . Sexual activity: Yes    Birth control/protection: Condom  Other Topics Concern  . Not on file  Social History Narrative  . Not on file   Social Determinants of Health   Financial Resource Strain:   . Difficulty of Paying Living Expenses:   Food Insecurity:   . Worried About Charity fundraiser in the Last Year:   . Arboriculturist in the Last Year:   Transportation Needs:   . Film/video editor (Medical):   Marland Kitchen Lack of Transportation (Non-Medical):   Physical Activity:   . Days of Exercise per Week:   . Minutes of Exercise per Session:   Stress:   . Feeling of Stress :   Social Connections:   . Frequency of Communication with Friends and Family:   . Frequency of Social Gatherings with Friends and Family:   . Attends Religious Services:   . Active Member of Clubs or Organizations:   . Attends Archivist Meetings:   Marland Kitchen Marital Status:   Intimate Partner Violence:   . Fear of Current or Ex-Partner:   . Emotionally Abused:   Marland Kitchen Physically Abused:   . Sexually Abused:      Family History  Problem Relation Age of Onset  . Lung cancer Neg Hx      Review of Systems  Constitutional: Negative.  Negative for chills and fever.  HENT: Negative.  Negative for congestion and sore throat.   Respiratory: Negative.  Negative for cough and shortness of breath.   Cardiovascular: Negative.  Negative for chest pain and palpitations.  Gastrointestinal: Negative.  Negative for abdominal pain, diarrhea, nausea and vomiting.  Genitourinary: Negative.  Negative for dysuria and hematuria.  Musculoskeletal: Negative.  Negative for back pain, myalgias and neck pain.  Skin: Negative.  Negative for rash.  Neurological: Negative.  Negative for dizziness and headaches.  Endo/Heme/Allergies: Negative.   All other systems reviewed and are  negative.  Today's Vitals   12/06/19 0825  BP: (!) 170/87  Pulse: 67  Resp: 15  Temp: 98.4 F (36.9 C)  TempSrc: Temporal  SpO2: 99%  Weight: 161 lb 12.8 oz (73.4 kg)  Height: 5\' 6"  (1.676 m)   Body mass index is 26.12 kg/m.   Physical Exam Vitals reviewed.  Constitutional:      Appearance: Normal appearance.  HENT:     Head: Normocephalic.  Eyes:     Extraocular Movements: Extraocular movements intact.     Conjunctiva/sclera: Conjunctivae normal.     Pupils: Pupils are equal, round, and reactive to light.  Cardiovascular:     Rate and Rhythm: Normal rate and regular rhythm.     Pulses: Normal pulses.     Heart sounds: Normal heart sounds.  Pulmonary:     Effort: Pulmonary effort is normal.     Breath sounds: Normal breath sounds.  Abdominal:     Tenderness: There is no abdominal tenderness.  Musculoskeletal:        General: Normal range of motion.  Skin:    General: Skin is warm and dry.     Capillary Refill: Capillary refill takes less than 2 seconds.  Neurological:     General: No focal deficit present.     Mental Status: He is alert and oriented to person, place, and time.    Results for orders placed or  performed in visit on 12/06/19 (from the past 24 hour(s))  POCT glucose (manual entry)     Status: Abnormal   Collection Time: 12/06/19  8:52 AM  Result Value Ref Range   POC Glucose 149 (A) 70 - 99 mg/dl  POCT glycosylated hemoglobin (Hb A1C)     Status: Abnormal   Collection Time: 12/06/19  8:57 AM  Result Value Ref Range   Hemoglobin A1C 8.4 (A) 4.0 - 5.6 %   HbA1c POC (<> result, manual entry)     HbA1c, POC (prediabetic range)     HbA1c, POC (controlled diabetic range)     A total of 30 minutes was spent with the patient, greater than 50% of which was in counseling/coordination of care regarding diabetes and hypertension and cardiovascular risks associated with these conditions, review of all medications and dose changes, review of new medication Januvia, diet and nutrition, review of most recent blood work results including today's hemoglobin A1c, review of most recent office visit notes, prognosis and need for follow-up in 3 months.   ASSESSMENT & PLAN: Hypertension associated with type 2 diabetes mellitus (H[facility]) Elevated systolic blood pressure.  Was supposed to be taking 10 mg of amlodipine but instead he is taking 5 mg daily.  New prescription for amlodipine 10 mg daily sent.  Continue lisinopril 40 mg daily.  Continue monitoring blood pressure at home. Uncontrolled diabetes with hemoglobin A1c higher than before at 8.4.  Continue Metformin and glipizide.  Will add Januvia 50 mg daily.  Diet and nutrition discussed. Follow-up in 3 months.  Louise was seen today for diabetes and hypertension.  Diagnoses and all orders for this visit:  Hypertension associated with type 2 diabetes mellitus (HCC) -     Comprehensive metabolic panel -     Lipid panel -     POCT glucose (manual entry) -     POCT glycosylated hemoglobin (Hb A1C) -     Microalbumin / creatinine urine ratio  Essential hypertension -     lisinopril (ZESTRIL) 40 MG tablet; Take 1 tablet (40 mg total)  by mouth daily. -      amLODipine (NORVASC) 10 MG tablet; Take 1 tablet (10 mg total) by mouth daily.  Dyslipidemia associated with type 2 diabetes mellitus ([place])  Primary cancer of right upper lobe of lung ([place])  Type 2 diabetes mellitus with hyperglycemia, without long-term current use of insulin (HCC) -     sitaGLIPtin (JANUVIA) 50 MG tablet; Take 1 tablet (50 mg total) by mouth daily.    Patient Instructions   Continue lisinopril 40 mg daily.  Continue amlodipine at increased dose of 10 mg daily.  New prescription sent. Continue Metformin and glipizide.  Start Januvia 50 mg daily. Follow-up in 3 months.     If you have lab work done today you will be contacted with your lab results within the next 2 weeks.  If you have not heard from [place] then please contact us. The fastest way to get your results is to register for My Chart.   IF you received an x-ray today, you will receive an invoice from [facility]. Please contact [facility] at [phone] with questions or concerns regarding your invoice.   IF you received labwork today, you will receive an invoice from [facility]. Please contact [facility] at [phone] with questions or concerns regarding your invoice.   Our billing staff will not be able to assist you with questions regarding bills from these companies.  You will be contacted with the lab results as soon as they are available. The fastest way to get your results is to activate your My Chart account. Instructions are located on the last page of this paperwork. If you have not heard from [place] regarding the results in 2 weeks, please contact this office.     Hypertension, Adult High blood pressure (hypertension) is when the force of blood pumping through the arteries is too strong. The arteries are the blood vessels that carry blood from the heart throughout the body. Hypertension forces the heart to work harder to pump blood and may cause arteries to become narrow or stiff.  Untreated or uncontrolled hypertension can cause a heart attack, heart failure, a stroke, kidney disease, and other problems. A blood pressure reading consists of a higher number over a lower number. Ideally, your blood pressure should be below 120/80. The first ("top") number is called the systolic pressure. It is a measure of the pressure in your arteries as your heart beats. The second ("bottom") number is called the diastolic pressure. It is a measure of the pressure in your arteries as the heart relaxes. What are the causes? The exact cause of this condition is not known. There are some conditions that result in or are related to high blood pressure. What increases the risk? Some risk factors for high blood pressure are under your control. The following factors may make you more likely to develop this condition:  Smoking.  Having type 2 diabetes mellitus, high cholesterol, or both.  Not getting enough exercise or physical activity.  Being overweight.  Having too much fat, sugar, calories, or salt (sodium) in your diet.  Drinking too much alcohol. Some risk factors for high blood pressure may be difficult or impossible to change. Some of these factors include:  Having chronic kidney disease.  Having a family history of high blood pressure.  Age. Risk increases with age.  Race. You may be at higher risk if you are African American.  Gender. Men are at higher risk than women before age 11. After age 61, women  are at higher risk than men.  Having obstructive sleep apnea.  Stress. What are the signs or symptoms? High blood pressure may not cause symptoms. Very high blood pressure (hypertensive crisis) may cause:  Headache.  Anxiety.  Shortness of breath.  Nosebleed.  Nausea and vomiting.  Vision changes.  Severe chest pain.  Seizures. How is this diagnosed? This condition is diagnosed by measuring your blood pressure while you are seated, with your arm resting on a  flat surface, your legs uncrossed, and your feet flat on the floor. The cuff of the blood pressure monitor will be placed directly against the skin of your upper arm at the level of your heart. It should be measured at least twice using the same arm. Certain conditions can cause a difference in blood pressure between your right and left arms. Certain factors can cause blood pressure readings to be lower or higher than normal for a short period of time:  When your blood pressure is higher when you are in a health care provider's office than when you are at home, this is called white coat hypertension. Most people with this condition do not need medicines.  When your blood pressure is higher at home than when you are in a health care provider's office, this is called masked hypertension. Most people with this condition may need medicines to control blood pressure. If you have a high blood pressure reading during one visit or you have normal blood pressure with other risk factors, you may be asked to:  Return on a different day to have your blood pressure checked again.  Monitor your blood pressure at home for 1 week or longer. If you are diagnosed with hypertension, you may have other blood or imaging tests to help your health care provider understand your overall risk for other conditions. How is this treated? This condition is treated by making healthy lifestyle changes, such as eating healthy foods, exercising more, and reducing your alcohol intake. Your health care provider may prescribe medicine if lifestyle changes are not enough to get your blood pressure under control, and if:  Your systolic blood pressure is above 130.  Your diastolic blood pressure is above 80. Your personal target blood pressure may vary depending on your medical conditions, your age, and other factors. Follow these instructions at home: Eating and drinking   Eat a diet that is high in fiber and potassium, and low in  sodium, added sugar, and fat. An example eating plan is called the DASH (Dietary Approaches to Stop Hypertension) diet. To eat this way: ? Eat plenty of fresh fruits and vegetables. Try to fill one half of your plate at each meal with fruits and vegetables. ? Eat whole grains, such as whole-wheat pasta, brown rice, or whole-grain bread. Fill about one fourth of your plate with whole grains. ? Eat or drink low-fat dairy products, such as skim milk or low-fat yogurt. ? Avoid fatty cuts of meat, processed or cured meats, and poultry with skin. Fill about one fourth of your plate with lean proteins, such as fish, chicken without skin, beans, eggs, or tofu. ? Avoid pre-made and processed foods. These tend to be higher in sodium, added sugar, and fat.  Reduce your daily sodium intake. Most people with hypertension should eat less than 1,500 mg of sodium a day.  Do not drink alcohol if: ? Your health care provider tells you not to drink. ? You are pregnant, may be pregnant, or are planning to become  pregnant.  If you drink alcohol: ? Limit how much you use to:  0-1 drink a day for women.  0-2 drinks a day for men. ? Be aware of how much alcohol is in your drink. In the [place] one drink equals one 12 oz bottle of beer (355 mL), one 5 oz glass of wine (148 mL), or one 1 oz glass of hard liquor (44 mL). Lifestyle   Work with your health care provider to maintain a healthy body weight or to lose weight. Ask what an ideal weight is for you.  Get at least 30 minutes of exercise most days of the week. Activities may include walking, swimming, or biking.  Include exercise to strengthen your muscles (resistance exercise), such as Pilates or lifting weights, as part of your weekly exercise routine. Try to do these types of exercises for 30 minutes at least 3 days a week.  Do not use any products that contain nicotine or tobacco, such as cigarettes, e-cigarettes, and chewing tobacco. If you need help  quitting, ask your health care provider.  Monitor your blood pressure at home as told by your health care provider.  Keep all follow-up visits as told by your health care provider. This is important. Medicines  Take over-the-counter and prescription medicines only as told by your health care provider. Follow directions carefully. Blood pressure medicines must be taken as prescribed.  Do not skip doses of blood pressure medicine. Doing this puts you at risk for problems and can make the medicine less effective.  Ask your health care provider about side effects or reactions to medicines that you should watch for. Contact a health care provider if you:  Think you are having a reaction to a medicine you are taking.  Have headaches that keep coming back (recurring).  Feel dizzy.  Have swelling in your ankles.  Have trouble with your vision. Get help right away if you:  Develop a severe headache or confusion.  Have unusual weakness or numbness.  Feel faint.  Have severe pain in your chest or abdomen.  Vomit repeatedly.  Have trouble breathing. Summary  Hypertension is when the force of blood pumping through your arteries is too strong. If this condition is not controlled, it may put you at risk for serious complications.  Your personal target blood pressure may vary depending on your medical conditions, your age, and other factors. For most people, a normal blood pressure is less than 120/80.  Hypertension is treated with lifestyle changes, medicines, or a combination of both. Lifestyle changes include losing weight, eating a healthy, low-sodium diet, exercising more, and limiting alcohol. This information is not intended to replace advice given to you by your health care provider. Make sure you discuss any questions you have with your health care provider. Document Revised: 12/30/2017 Document Reviewed: 12/30/2017 Elsevier Patient Education  [employer].  Diabetes  [place]ellitus and Nutrition, Adult When you have diabetes (diabetes mellitus), it is very important to have healthy eating habits because your blood sugar (glucose) levels are greatly affected by what you eat and drink. Eating healthy foods in the appropriate amounts, at about the same times every day, can help you:  Control your blood glucose.  Lower your risk of heart disease.  Improve your blood pressure.  Reach or maintain a healthy weight. Every person with diabetes is different, and each person has different needs for a meal plan. Your health care provider may recommend that you work with a diet and nutrition  specialist (dietitian) to make a meal plan that is best for you. Your meal plan may vary depending on factors such as:  The calories you need.  The medicines you take.  Your weight.  Your blood glucose, blood pressure, and cholesterol levels.  Your activity level.  Other health conditions you have, such as heart or kidney disease. How do carbohydrates affect me? Carbohydrates, also called carbs, affect your blood glucose level more than any other type of food. Eating carbs naturally raises the amount of glucose in your blood. Carb counting is a method for keeping track of how many carbs you eat. Counting carbs is important to keep your blood glucose at a healthy level, especially if you use insulin or take certain oral diabetes medicines. It is important to know how many carbs you can safely have in each meal. This is different for every person. Your dietitian can help you calculate how many carbs you should have at each meal and for each snack. Foods that contain carbs include:  Bread, cereal, rice, pasta, and crackers.  Potatoes and corn.  Peas, beans, and lentils.  Milk and yogurt.  Fruit and juice.  Desserts, such as cakes, cookies, ice cream, and candy. How does alcohol affect me? Alcohol can cause a sudden decrease in blood glucose (hypoglycemia), especially if you  use insulin or take certain oral diabetes medicines. Hypoglycemia can be a life-threatening condition. Symptoms of hypoglycemia (sleepiness, dizziness, and confusion) are similar to symptoms of having too much alcohol. If your health care provider says that alcohol is safe for you, follow these guidelines:  Limit alcohol intake to no more than 1 drink per day for nonpregnant women and 2 drinks per day for men. One drink equals 12 oz of beer, 5 oz of wine, or 1 oz of hard liquor.  Do not drink on an empty stomach.  Keep yourself hydrated with water, diet soda, or unsweetened iced tea.  Keep in mind that regular soda, juice, and other mixers may contain a lot of sugar and must be counted as carbs. What are tips for following this plan?  Reading food labels  Start by checking the serving size on the "Nutrition Facts" label of packaged foods and drinks. The amount of calories, carbs, fats, and other nutrients listed on the label is based on one serving of the item. Many items contain more than one serving per package.  Check the total grams (g) of carbs in one serving. You can calculate the number of servings of carbs in one serving by dividing the total carbs by 15. For example, if a food has 30 g of total carbs, it would be equal to 2 servings of carbs.  Check the number of grams (g) of saturated and trans fats in one serving. Choose foods that have low or no amount of these fats.  Check the number of milligrams (mg) of salt (sodium) in one serving. Most people should limit total sodium intake to less than 2,300 mg per day.  Always check the nutrition information of foods labeled as "low-fat" or "nonfat". These foods may be higher in added sugar or refined carbs and should be avoided.  Talk to your dietitian to identify your daily goals for nutrients listed on the label. Shopping  Avoid buying canned, premade, or processed foods. These foods tend to be high in fat, sodium, and added  sugar.  Shop around the outside edge of the grocery store. This includes fresh fruits and vegetables, bulk grains,  fresh meats, and fresh dairy. Cooking  Use low-heat cooking methods, such as baking, instead of high-heat cooking methods like deep frying.  Cook using healthy oils, such as olive, canola, or sunflower oil.  Avoid cooking with butter, cream, or high-fat meats. Meal planning  Eat meals and snacks regularly, preferably at the same times every day. Avoid going long periods of time without eating.  Eat foods high in fiber, such as fresh fruits, vegetables, beans, and whole grains. Talk to your dietitian about how many servings of carbs you can eat at each meal.  Eat 4-6 ounces (oz) of lean protein each day, such as lean meat, chicken, fish, eggs, or tofu. One oz of lean protein is equal to: ? 1 oz of meat, chicken, or fish. ? 1 egg. ?  cup of tofu.  Eat some foods each day that contain healthy fats, such as avocado, nuts, seeds, and fish. Lifestyle  Check your blood glucose regularly.  Exercise regularly as told by your health care provider. This may include: ? 150 minutes of moderate-intensity or vigorous-intensity exercise each week. This could be brisk walking, biking, or water aerobics. ? Stretching and doing strength exercises, such as yoga or weightlifting, at least 2 times a week.  Take medicines as told by your health care provider.  Do not use any products that contain nicotine or tobacco, such as cigarettes and e-cigarettes. If you need help quitting, ask your health care provider.  Work with a Social worker or diabetes educator to identify strategies to manage stress and any emotional and social challenges. Questions to ask a health care provider  Do I need to meet with a diabetes educator?  Do I need to meet with a dietitian?  What number can I call if I have questions?  When are the best times to check my blood glucose? Where to find more  information:  American Diabetes Association: diabetes.org  Academy of Nutrition and Dietetics: www.eatright.[employer] of Diabetes and Digestive and Kidney Diseases (NIH): [employer] Summary  A healthy meal plan will help you control your blood glucose and maintain a healthy lifestyle.  Working with a diet and nutrition specialist (dietitian) can help you make a meal plan that is best for you.  Keep in mind that carbohydrates (carbs) and alcohol have immediate effects on your blood glucose levels. It is important to count carbs and to use alcohol carefully. This information is not intended to replace advice given to you by your health care provider. Make sure you discuss any questions you have with your health care provider. Document Revised: 04/03/2017 Document Reviewed: 05/26/2016 Elsevier Patient Education  2020 Elsevier Inc.      Agustina Caroli, MD [facility]

## 2019-12-06 NOTE — Assessment & Plan Note (Addendum)
Elevated systolic blood pressure.  Was supposed to be taking 10 mg of amlodipine but instead he is taking 5 mg daily.  New prescription for amlodipine 10 mg daily sent.  Continue lisinopril 40 mg daily.  Continue monitoring blood pressure at home. Uncontrolled diabetes with hemoglobin A1c higher than before at 8.4.  Continue Metformin and glipizide.  Will add Januvia 50 mg daily.  Diet and nutrition discussed. Follow-up in 3 months.

## 2019-12-07 LAB — COMPREHENSIVE METABOLIC PANEL
ALT: 32 IU/L (ref 0–44)
AST: 24 IU/L (ref 0–40)
Albumin/Globulin Ratio: 1.8 (ref 1.2–2.2)
Albumin: 4.1 g/dL (ref 3.7–4.7)
Alkaline Phosphatase: 100 IU/L (ref 48–121)
BUN/Creatinine Ratio: 19 (ref 10–24)
BUN: 16 mg/dL (ref 8–27)
Bilirubin Total: 0.5 mg/dL (ref 0.0–1.2)
CO2: 24 mmol/L (ref 20–29)
Calcium: 9.2 mg/dL (ref 8.6–10.2)
Chloride: 107 mmol/L — ABNORMAL HIGH (ref 96–106)
Creatinine, Ser: 0.85 mg/dL (ref 0.76–1.27)
GFR calc Af Amer: 101 mL/min/{1.73_m2} (ref >59)
GFR calc non Af Amer: 87 mL/min/{1.73_m2} (ref >59)
Globulin, Total: 2.3 g/dL (ref 1.5–4.5)
Glucose: 144 mg/dL — ABNORMAL HIGH (ref 65–99)
Potassium: 4.3 mmol/L (ref 3.5–5.2)
Sodium: 143 mmol/L (ref 134–144)
Total Protein: 6.4 g/dL (ref 6.0–8.5)

## 2019-12-07 LAB — LIPID PANEL
Chol/HDL Ratio: 3 ratio (ref 0.0–5.0)
Cholesterol, Total: 146 mg/dL (ref 100–199)
HDL: 48 mg/dL (ref >39)
LDL Chol Calc (NIH): 85 mg/dL (ref 0–99)
Triglycerides: 62 mg/dL (ref 0–149)
VLDL Cholesterol Cal: 13 mg/dL (ref 5–40)

## 2019-12-07 LAB — MICROALBUMIN / CREATININE URINE RATIO
Creatinine, Urine: 130 mg/dL
Microalb/Creat Ratio: 30 mg/g creat — ABNORMAL HIGH (ref 0–29)
Microalbumin, Urine: 38.6 ug/mL

## 2020-01-04 ENCOUNTER — Telehealth: Payer: Self-pay | Admitting: *Deleted

## 2020-01-04 NOTE — Telephone Encounter (Signed)
CALLED [name]- [place] TO INFORM OF CT FOR 01-25-20- ARRIVAL TIME- 12:45 PM @ [facility], NO RESTRICTIONS TO TEST, FU WITH DR. KINARD ON 01-26-20 @ 4 PM FOR RESULTS, SPOKE WITH [name]- DENNA LANDIS AND SHE IS AWARE OF THESE APPTS.

## 2020-01-04 NOTE — Telephone Encounter (Signed)
CALLED PATIENT TO ASK ABOUT MISSING CT, SPOKE WITH [name], DEANNA, AND SHE SAID THAT THEY HAVE JUST RETURNED FROM BEING OUT OF TOWN, AND THEY WOULD BE WILLING TO RESCHEDULE FOR MID-September, I TOLD HER THAT I WILL ARRANGE THIS AND CALL HER BACK.

## 2020-01-05 ENCOUNTER — Ambulatory Visit: Payer: Medicare Other | Admitting: Radiation Oncology

## 2020-01-25 ENCOUNTER — Other Ambulatory Visit: Payer: Self-pay

## 2020-01-25 ENCOUNTER — Ambulatory Visit (HOSPITAL_COMMUNITY)
Admission: RE | Admit: 2020-01-25 | Discharge: 2020-01-25 | Disposition: A | Discharge status: Home / Self Care | Payer: Medicare Other | Source: Ambulatory Visit | Attending: Radiation Oncology | Admitting: Radiation Oncology

## 2020-01-25 DIAGNOSIS — C3411 Malignant neoplasm of upper lobe, right bronchus or lung: Secondary | ICD-10-CM | POA: Insufficient documentation

## 2020-01-25 IMAGING — CT CT CHEST W/O CM
2 of 3 series · 15 of 36 positions shown, 18 images · non-contrast
Comparison: [DATE]

CLINICAL DATA: Non-small-cell lung cancer.  Restaging.

EXAM:
CT CHEST WITHOUT CONTRAST
TECHNIQUE: Multidetector CT imaging of the chest was performed following the
standard protocol without IV contrast.

[Series 2: thorax · axial · 0.63mm/px · z∈[-386,-132]mm · 12 of 149 slices shown, 15 images]
[im 11/149  mediastinal]
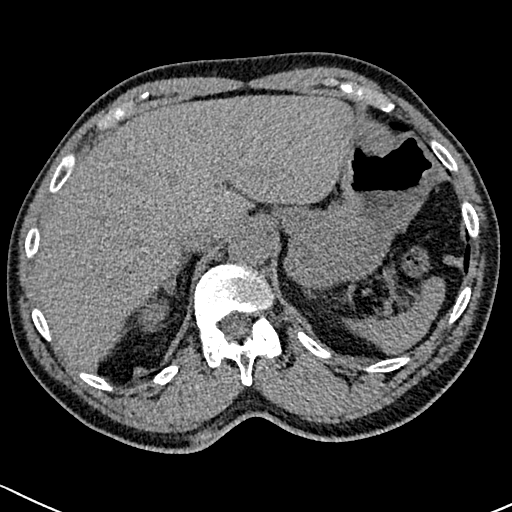
[im 11/149  lung]
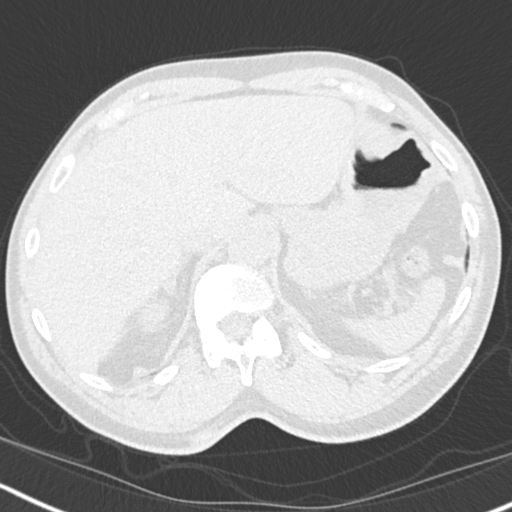
[im 22/149  lung]
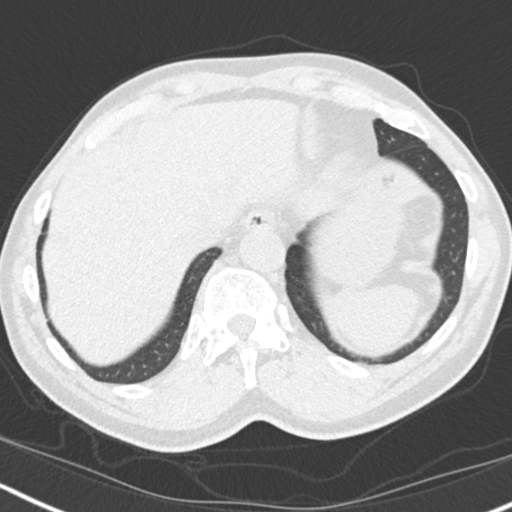
[im 33/149  lung]
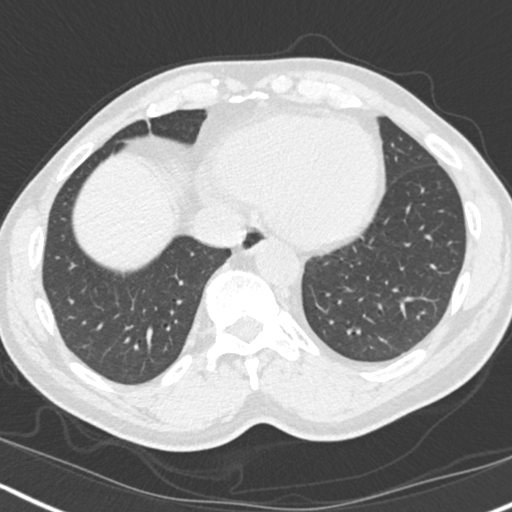
[im 44/149  lung]
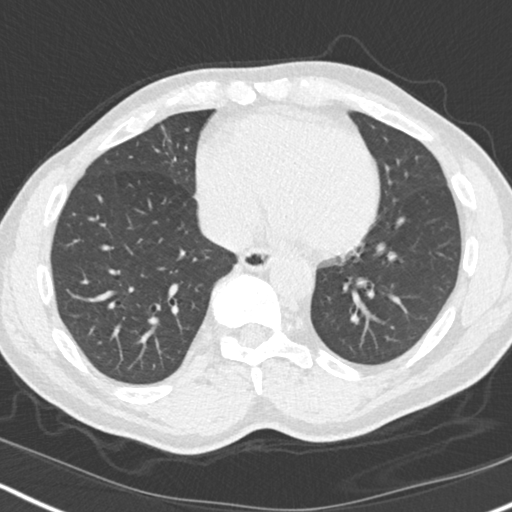
[im 55/149  mediastinal]
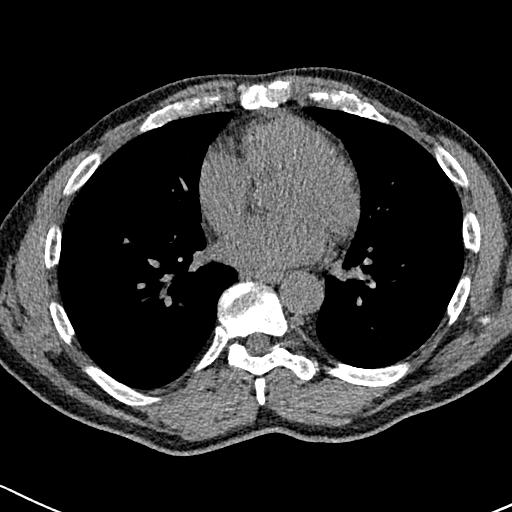
[im 55/149  lung]
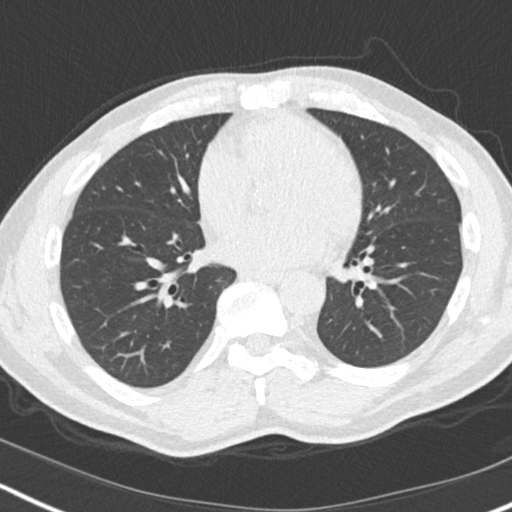
[im 66/149  lung]
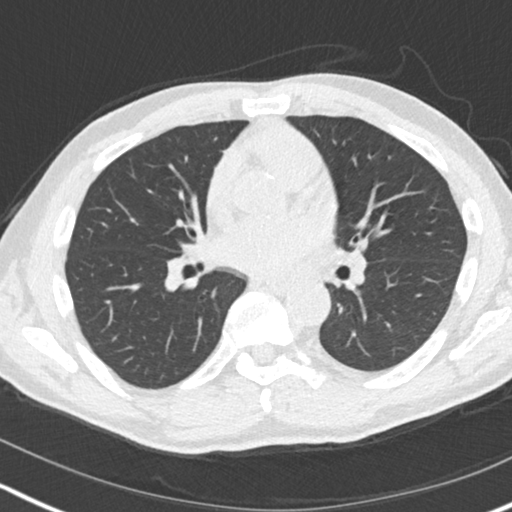
[im 83/149  lung]
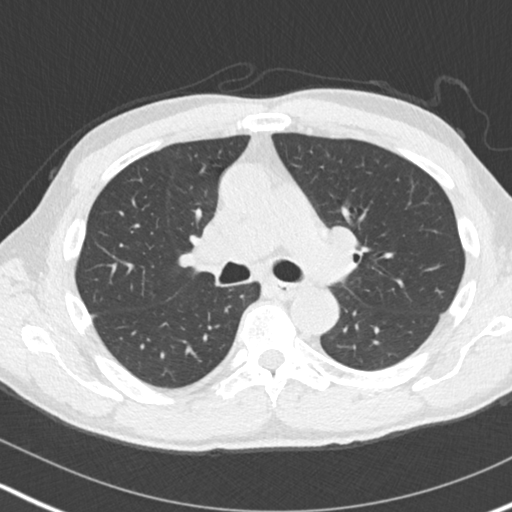
[im 94/149  lung]
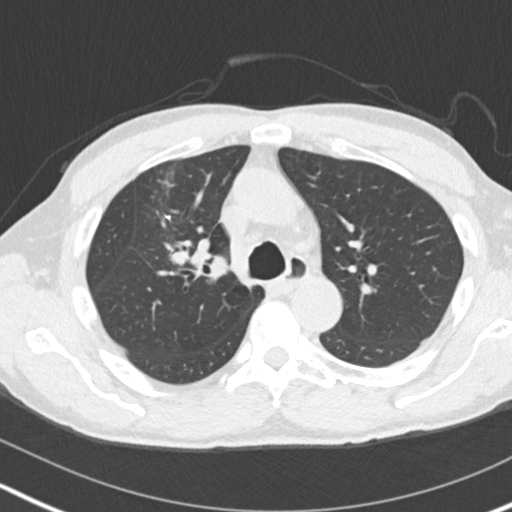
[im 105/149  mediastinal]
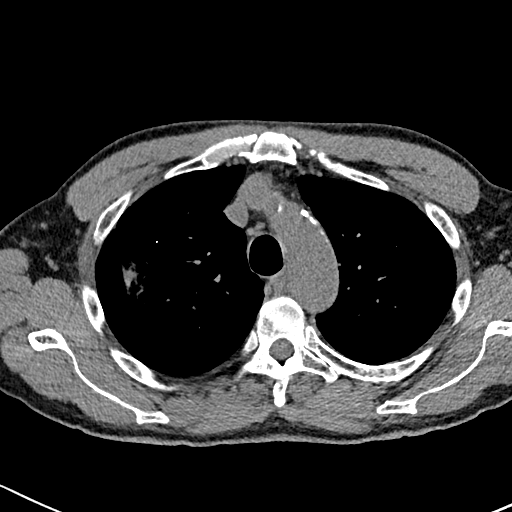
[im 105/149  lung]
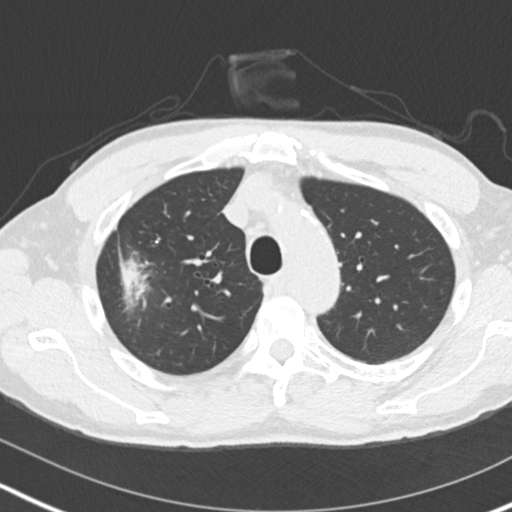
[im 116/149  lung]
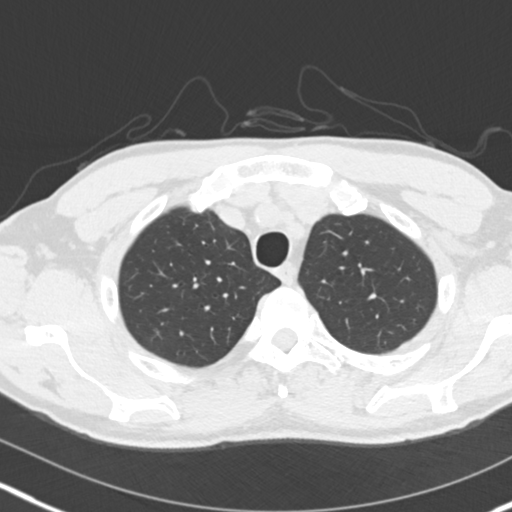
[im 127/149  lung]
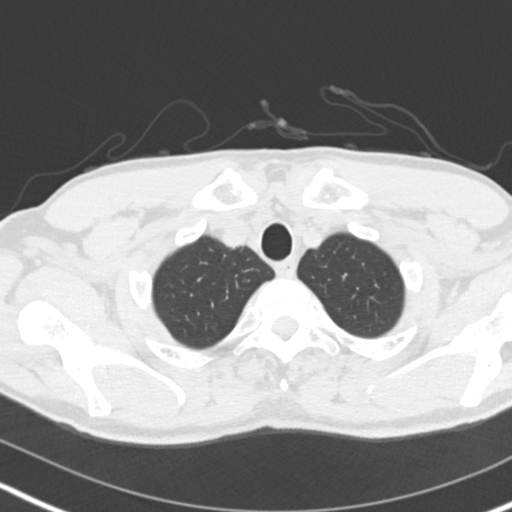
[im 138/149  lung]
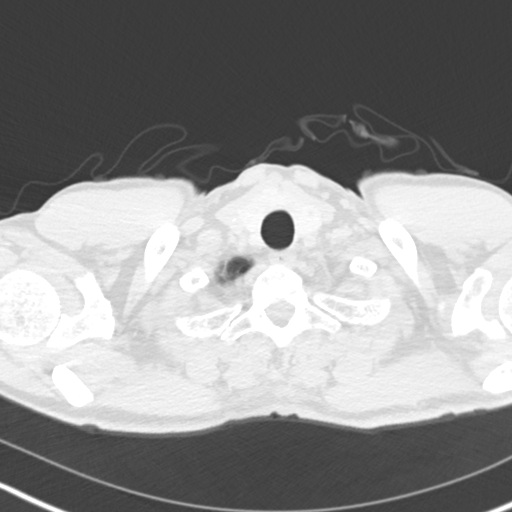

[Series 6: coronal · coronal · 0.62mm/px · 3 of 125 slices shown]
[im 25/125  lung]
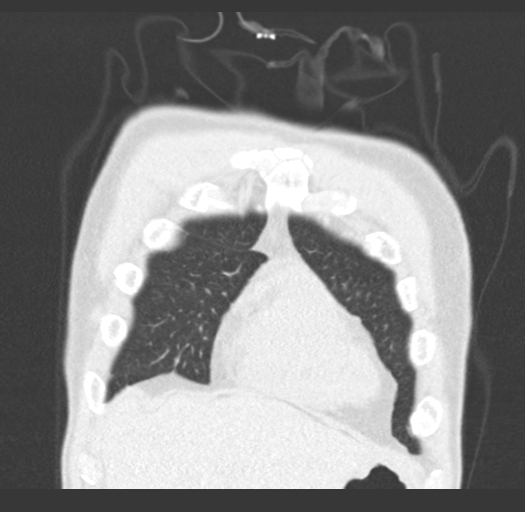
[im 50/125  lung]
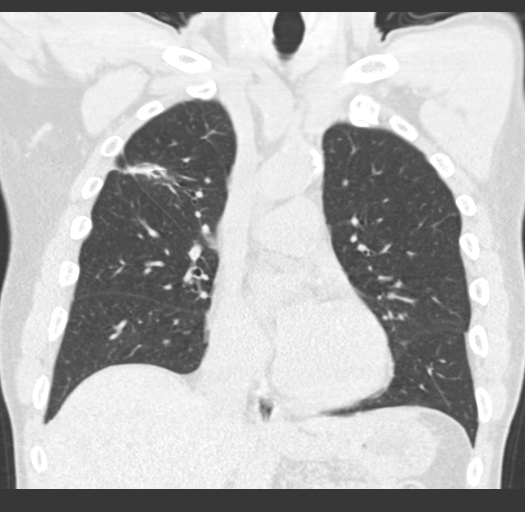
[im 75/125  lung]
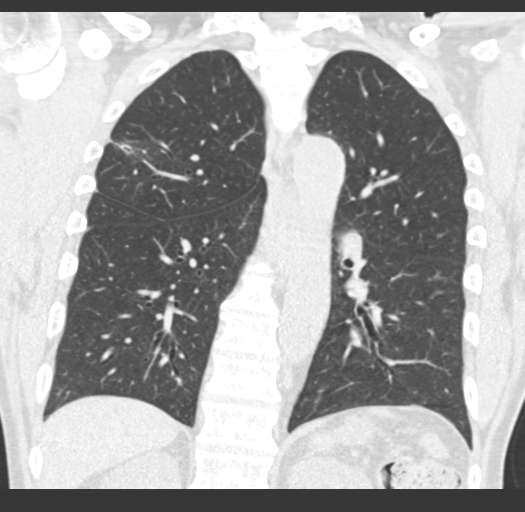

[15 of 36 positions shown; findings below may reference images not displayed]

FINDINGS: Cardiovascular: The heart size is normal. No substantial pericardial
effusion. Atherosclerotic calcification is noted in the wall of the
thoracic aorta.

Mediastinum/Nodes: No mediastinal lymphadenopathy. No evidence for
gross hilar lymphadenopathy although assessment is limited by the
lack of intravenous contrast on today's study. The esophagus has
normal imaging features. There is no axillary lymphadenopathy.

Lungs/Pleura: The right upper lobe pulmonary nodule measured on the
previous study has become incorporated into a plaque-like area of
consolidative opacity compatible with radiation fibrosis. No
residual measurable nodule at this time.

In the interval since [DATE], the patient has developed a 1.7 x
1.5 cm central right upper lobe suprahilar nodule (see image 53 of
series 5). This is at the level of and posterior to the inferior
most fiducial marker and no nodule was visible at this location in
retrospect. This nodule is also well demonstrated on coronal image
60 of series 6.

7 mm central right upper lobe pulmonary nodule on 62/5 is new in the
interval. Also well seen coronal image 61/6.

A third 1.4 x 1.0 cm right upper lobe parahilar nodule on 65/5 is
new in the interval. Also well seen coronal image 53/6.

No pleural effusion.

Upper Abdomen: Unremarkable.

Musculoskeletal: No worrisome lytic or sclerotic osseous
abnormality.
IMPRESSION: 1. The right upper lobe pulmonary nodule measured on the previous
study has become incorporated into a plaque-like area of
consolidative opacity compatible with radiation fibrosis. No
residual measurable nodule at this time.
2. Interval development of 3 new parahilar central right upper lobe
pulmonary nodules, measuring up to 1.7 cm in size. Imaging features
are highly concerning for metastatic disease. PET-CT may prove
helpful to further evaluate.
3. Aortic Atherosclerosis ([C7]-[C7]).

These results will be called to the ordering clinician or
representative by the Radiologist Assistant, and communication
documented in the PACS or [REDACTED].

## 2020-01-25 NOTE — Progress Notes (Signed)
Radiation Oncology         [phone]) [phone] ________________________________  Name: Benjamin Willis. MRN: [id number]  Date: 01/26/2020  DOB: [birth year]  Follow-Up Visit Note  CC: Horald Pollen, MD  Garner Nash, DO    ICD-10-CM   1. Primary cancer of right upper lobe of lung (HCC)  C34.11     Diagnosis: Stage IA2(cT1b, N0)non-small cell lung cancer (adenocarcinoma)  Interval Since Last Radiation: Eleven months  adiation Treatment Dates: 02/21/2019 through 02/28/2019 Site Technique Total Dose (Gy) Dose per Fx (Gy) Completed Fx Beam Energies  Thorax: Lung_Rt SBRT 54/54 18 3/3 6XFFF    Narrative:  The patient returns today for routine follow-up. Since his last visit, he underwent a chest CT scan yesterday, 01/25/2020. Results showed that the right upper lobe pulmonary nodule measured on the previous study had become incorporated into a plaque-like area of consolidative opacity that was compatible with radiation fibrosis. There was no residual measurable nodule at that time. However, there was interval development of three new parahilar central right upper lobe pulmonary nodules that measured up to 1.7 cm in size. Imaging features were highly concerning for metastatic disease. A PET scan was recommended for further evaluation.  On review of systems, he reports having a good appetite. He also reports a productive cough at night with clear sputum. He denies chest pain, difficulty swallowing, and shortness of breath.  He denies any hemoptysis  ALLERGIES:  has No Known Allergies.  Meds: Current Outpatient Medications  Medication Sig Dispense Refill  . albuterol (VENTOLIN HFA) 108 (90 Base) MCG/ACT inhaler Inhale 2 puffs into the lungs every 6 (six) hours as needed for wheezing or shortness of breath. 18 g 6  . amLODipine (NORVASC) 10 MG tablet Take 1 tablet (10 mg total) by mouth daily. 90 tablet 3  . atorvastatin (LIPITOR) 20 MG tablet Take 1 tablet (20 mg total) by mouth  daily. 90 tablet 3  . azithromycin (ZITHROMAX) 250 MG tablet Sig as indicated 6 tablet 0  . blood glucose meter kit and supplies Per insurance preference. Check blood glucose once a day. Dx E11.9 1 each 3  . Blood Pressure Monitoring (BLOOD PRESSURE MONITOR/ARM) DEVI Check BP once a day. DX I 10 1 each 0  . cetirizine (ZYRTEC) 10 MG tablet Take 1 tablet (10 mg total) by mouth daily. 30 tablet 11  . lisinopril (ZESTRIL) 40 MG tablet Take 1 tablet (40 mg total) by mouth daily. 90 tablet 3  . Multiple Vitamins-Minerals (MULTIVITAMIN MEN) TABS Take 1 tablet by mouth daily.    . sitaGLIPtin (JANUVIA) 50 MG tablet Take 1 tablet (50 mg total) by mouth daily. 90 tablet 3  . furosemide (LASIX) 20 MG tablet Take 20 mg by mouth daily.    Marland Kitchen glipiZIDE (GLUCOTROL) 5 MG tablet Take 1 tablet (5 mg total) by mouth 2 (two) times daily with a meal. 180 tablet 3  . latanoprost (XALATAN) 0.005 % ophthalmic solution     . metFORMIN (GLUCOPHAGE) 500 MG tablet Take 1 tablet (500 mg total) by mouth 2 (two) times daily with a meal. Take 1 tablet by mouth once daily with breakfast 180 tablet 1  . rosuvastatin (CRESTOR) 20 MG tablet Take 20 mg by mouth at bedtime.     No current facility-administered medications for this encounter.    Physical Findings: The patient is in no acute distress. Patient is alert and oriented.  height is _0  (1.676 m) and weight is 163 lb (73.9 kg).  His temperature is 97.7 F (36.5 C). His blood pressure is 151/81 (abnormal) and his pulse is 91. His respiration is 18 and oxygen saturation is 100%. .   Lungs are clear to auscultation bilaterally. Heart has regular rate and rhythm. No palpable cervical, supraclavicular, or axillary adenopathy. Abdomen soft, non-tender, normal bowel sounds.   Lab Findings: Lab Results  Component Value Date   WBC 5.7 05/30/2019   HGB 12.8 (L) 05/30/2019   HCT 38.5 05/30/2019   MCV 91 05/30/2019   PLT 237 05/30/2019    Radiographic Findings: CT Chest Wo  Contrast  Result Date: 01/25/2020 CLINICAL DATA:  Non-small-cell lung cancer.  Restaging. EXAM: CT CHEST WITHOUT CONTRAST TECHNIQUE: Multidetector CT imaging of the chest was performed following the standard protocol without IV contrast. COMPARISON:  06/30/2019 FINDINGS: Cardiovascular: The heart size is normal. No substantial pericardial effusion. Atherosclerotic calcification is noted in the wall of the thoracic aorta. Mediastinum/Nodes: No mediastinal lymphadenopathy. No evidence for gross hilar lymphadenopathy although assessment is limited by the lack of intravenous contrast on today's study. The esophagus has normal imaging features. There is no axillary lymphadenopathy. Lungs/Pleura: The right upper lobe pulmonary nodule measured on the previous study has become incorporated into a plaque-like area of consolidative opacity compatible with radiation fibrosis. No residual measurable nodule at this time. In the interval since 06/30/2019, the patient has developed a 1.7 x 1.5 cm central right upper lobe suprahilar nodule (see image 53 of series 5). This is at the level of and posterior to the inferior most fiducial marker and no nodule was visible at this location in retrospect. This nodule is also well demonstrated on coronal image 60 of series 6. 7 mm central right upper lobe pulmonary nodule on 62/5 is new in the interval. Also well seen coronal image 61/6. A third 1.4 x 1.0 cm right upper lobe parahilar nodule on 65/5 is new in the interval. Also well seen coronal image 53/6. No pleural effusion. Upper Abdomen: Unremarkable. Musculoskeletal: No worrisome lytic or sclerotic osseous abnormality. IMPRESSION: 1. The right upper lobe pulmonary nodule measured on the previous study has become incorporated into a plaque-like area of consolidative opacity compatible with radiation fibrosis. No residual measurable nodule at this time. 2. Interval development of 3 new parahilar central right upper lobe pulmonary  nodules, measuring up to 1.7 cm in size. Imaging features are highly concerning for metastatic disease. PET-CT may prove helpful to further evaluate. 3. Aortic Atherosclerosis (ICD10-I70.0). These results will be called to the ordering clinician or representative by the Radiologist Assistant, and communication documented in the PACS or [employer]. Electronically Signed   By: Misty Stanley M.D.   On: 01/25/2020 14:23    Impression: Stage IA2(cT1b, N0)non-small cell lung cancer (adenocarcinoma)  Recent chest CT scan showed that the right upper lobe pulmonary nodule measured on the previous study had become incorporated into a plaque-like area of consolidative opacity that was compatible with radiation fibrosis. There was no residual measurable nodule at that time. However, there was interval development of three new parahilar central right upper lobe pulmonary nodules that measured up to 1.7 cm in size. Imaging features were highly concerning for metastatic disease. A PET scan was recommended for further evaluation.  I reviewed the patient's chest CT scan in detail with him.  He agrees with scheduling a PET scan for further evaluation.  Plan: Patient will be scheduled for a PET scan in the near future.  He will follow-up after this PET scan for further  decision making concerning possible recurrence.  Total time spent in this encounter was 25 minutes which included reviewing the patient's most recent chest CT scan, physical examination, ordering of future PET scan, and documentation.  ____________________________________  Blair Promise, PhD, MD  This document serves as a record of services personally performed by Gery Pray, MD. It was created on his behalf by Clerance Lav, a trained medical scribe. The creation of this record is based on the scribe's personal observations and the provider's statements to them. This document has been checked and approved by the attending provider.

## 2020-01-26 ENCOUNTER — Ambulatory Visit
Admission: RE | Admit: 2020-01-26 | Discharge: 2020-01-26 | Disposition: A | Discharge status: Home / Self Care | Payer: Medicare Other | Source: Ambulatory Visit | Attending: Radiation Oncology | Admitting: Radiation Oncology

## 2020-01-26 ENCOUNTER — Other Ambulatory Visit: Payer: Self-pay

## 2020-01-26 ENCOUNTER — Encounter: Payer: Self-pay | Admitting: Radiation Oncology

## 2020-01-26 DIAGNOSIS — Z7984 Long term (current) use of oral hypoglycemic drugs: Secondary | ICD-10-CM | POA: Insufficient documentation

## 2020-01-26 DIAGNOSIS — Z923 Personal history of irradiation: Secondary | ICD-10-CM | POA: Insufficient documentation

## 2020-01-26 DIAGNOSIS — I700 Atherosclerosis of aorta: Secondary | ICD-10-CM | POA: Insufficient documentation

## 2020-01-26 DIAGNOSIS — Z79899 Other long term (current) drug therapy: Secondary | ICD-10-CM | POA: Insufficient documentation

## 2020-01-26 DIAGNOSIS — C3411 Malignant neoplasm of upper lobe, right bronchus or lung: Secondary | ICD-10-CM | POA: Insufficient documentation

## 2020-01-26 NOTE — Progress Notes (Signed)
Patient is here for a f/u visit and to review his CT results with Dr. Sondra Come. Patient denies pain or problems swallowing. He reports  He has a good appetite. Patient is taking Metformin 500 mg PO daily. Patient denies shortness of breath. Reports havning some coughing at night with clear phlegm.  BP (!) 151/81 (BP Location: Left Arm, Patient Position: Sitting, Cuff Size: Normal)   Pulse 91   Temp 97.7 F (36.5 C)   Resp 18   Ht 5\' 6"  (1.676 m)   Wt 163 lb (73.9 kg)   SpO2 100%   BMI 26.31 kg/m   Wt Readings from Last 3 Encounters:  01/26/20 163 lb (73.9 kg)  12/06/19 161 lb 12.8 oz (73.4 kg)  09/13/19 180 lb (81.6 kg)

## 2020-01-30 ENCOUNTER — Other Ambulatory Visit: Payer: Self-pay | Admitting: Radiation Oncology

## 2020-01-30 DIAGNOSIS — C3411 Malignant neoplasm of upper lobe, right bronchus or lung: Secondary | ICD-10-CM

## 2020-02-09 ENCOUNTER — Ambulatory Visit: Payer: Self-pay | Admitting: Radiation Oncology

## 2020-02-13 ENCOUNTER — Other Ambulatory Visit: Payer: Self-pay

## 2020-02-13 ENCOUNTER — Ambulatory Visit (HOSPITAL_COMMUNITY)
Admission: RE | Admit: 2020-02-13 | Discharge: 2020-02-13 | Disposition: A | Discharge status: Home / Self Care | Payer: Medicare Other | Source: Ambulatory Visit | Attending: Radiation Oncology | Admitting: Radiation Oncology

## 2020-02-13 DIAGNOSIS — I700 Atherosclerosis of aorta: Secondary | ICD-10-CM | POA: Insufficient documentation

## 2020-02-13 DIAGNOSIS — C3411 Malignant neoplasm of upper lobe, right bronchus or lung: Secondary | ICD-10-CM | POA: Insufficient documentation

## 2020-02-13 LAB — GLUCOSE, CAPILLARY: Glucose-Capillary: 138 mg/dL — ABNORMAL HIGH (ref 70–99)

## 2020-02-13 IMAGING — PT NM PET TUM IMG RESTAG (PS) SKULL BASE T - THIGH
1 of 7 series · 1 of 25 positions shown · non-contrast
Comparison: CT chest [DATE], [DATE] and PET [DATE].

CLINICAL DATA: Subsequent treatment strategy for lung cancer.

EXAM:
NUCLEAR MEDICINE PET SKULL BASE TO THIGH
TECHNIQUE: 8.2 mCi F-18 FDG was injected intravenously. Full-ring PET imaging
was performed from the skull base to thigh after the radiotracer. CT
data was obtained and used for attenuation correction and anatomic
localization.
Fasting blood glucose: 138 mg/dl

[Series 4: ct sk_thigh 5.0 bf37 · axial · 5.0mm · 0.98mm/px · 1 of 232 slices shown]
[im 232/232  brain]
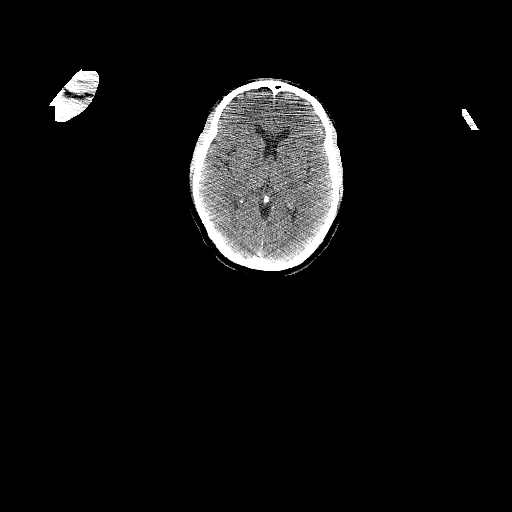

[1 of 25 positions shown; findings below may reference images not displayed]

FINDINGS: Mediastinal blood pool activity: SUV max

Liver activity: SUV max NA

NECK:

No hypermetabolic lymph nodes.

Incidental CT findings:

None.

CHEST:

A [DATE] x 1.5 cm nodule along the inferomedial margin of post
radiation changes in the right upper lobe ([DATE]) has an SUV max 7.6.
Two hypermetabolic right upper lobe perihilar nodules measure up to
11 x 12 mm ([DATE]) with an SUV max of 7.0. No definitive
hypermetabolic mediastinal, hilar or axillary lymph nodes.

Incidental CT findings:

Atherosclerotic calcification of the aorta. Heart is enlarged. No
pericardial or pleural effusion. Post radiation ground-glass,
bronchiectasis and volume loss in the right upper lobe with
associated fiducial markers.

ABDOMEN/PELVIS:

No abnormal hypermetabolism in the liver. A 1.0 x 1.6 cm soft tissue
nodule in the right adrenal gland has an SUV max of 3.8. Lesion is
stable from [DATE]. Nodular thickening of the lateral limb left
adrenal gland (4/121), with an SUV max of 2.8. Appearance is also
stable from [DATE]. No abnormal hypermetabolism in the left
adrenal gland, spleen or pancreas. No hypermetabolic lymph nodes.

Incidental CT findings:

Liver, gallbladder, adrenal glands, kidneys, spleen, pancreas,
stomach and bowel are otherwise grossly unremarkable.
Atherosclerotic calcification of the aorta without aneurysm.
Bilateral inguinal hernias contain fat. Trace pelvic free fluid.

SKELETON:

No abnormal osseous hypermetabolism.

Incidental CT findings:

Degenerative changes in the spine.
IMPRESSION: 1. Three hypermetabolic right upper lobe nodules, consistent with
disease recurrence.
2. Mildly hypermetabolic bilateral adrenal lesions are unchanged in
appearance from [DATE], indicative of adenomas.
3.  Aortic atherosclerosis ([EW]-[EW]).
4.

## 2020-02-13 MED ORDER — FLUDEOXYGLUCOSE F - 18 (FDG) INJECTION
8.1700 | Freq: Once | INTRAVENOUS | Status: AC | PRN
  Administered 2020-02-13: 8.17 via INTRAVENOUS

## 2020-02-16 ENCOUNTER — Encounter: Payer: Self-pay | Admitting: Radiation Oncology

## 2020-02-16 ENCOUNTER — Other Ambulatory Visit: Payer: Self-pay

## 2020-02-16 ENCOUNTER — Ambulatory Visit
Admission: RE | Admit: 2020-02-16 | Discharge: 2020-02-16 | Disposition: A | Discharge status: Home / Self Care | Payer: Medicare Other | Source: Ambulatory Visit | Attending: Radiation Oncology | Admitting: Radiation Oncology

## 2020-02-16 DIAGNOSIS — I700 Atherosclerosis of aorta: Secondary | ICD-10-CM | POA: Insufficient documentation

## 2020-02-16 DIAGNOSIS — Z7984 Long term (current) use of oral hypoglycemic drugs: Secondary | ICD-10-CM | POA: Insufficient documentation

## 2020-02-16 DIAGNOSIS — K4020 Bilateral inguinal hernia, without obstruction or gangrene, not specified as recurrent: Secondary | ICD-10-CM | POA: Insufficient documentation

## 2020-02-16 DIAGNOSIS — C3411 Malignant neoplasm of upper lobe, right bronchus or lung: Secondary | ICD-10-CM | POA: Diagnosis present

## 2020-02-16 DIAGNOSIS — Z79899 Other long term (current) drug therapy: Secondary | ICD-10-CM | POA: Diagnosis not present

## 2020-02-16 NOTE — Progress Notes (Signed)
Radiation Oncology         [phone]) [phone] ________________________________  Name: Benjamin Willis. MRN: [id number]  Date: 02/16/2020  DOB: February 04, 1946  Follow-Up Visit Note  CC: Horald Pollen, MD  Garner Nash, DO    ICD-10-CM   1. Primary cancer of right upper lobe of lung (HCC)  C34.11     Diagnosis: Stage IA2(cT1b, N0)non-small cell lung cancer (adenocarcinoma)  Interval Since Last Radiation: Eleven months, two weeks, and four days.  adiation Treatment Dates: 02/21/2019 through 02/28/2019 Site Technique Total Dose (Gy) Dose per Fx (Gy) Completed Fx Beam Energies  Thorax: Lung_Rt SBRT 54/54 18 3/3 6XFFF    Narrative:  The patient returns today to discuss recent PET scan results. PET scan was performed on 02/13/2020 and showed three hypermetabolic right upper lobe nodules that were consistent with disease recurrence. It also showed mildly hypermetabolic bilateral adrenal lesions that were unchanged in appearance from 06/17/2017, indicative of adenomas.   On review of systems, he reports a good appetite. He denies chest pain, shortness of breath, and cough.  ALLERGIES:  has No Known Allergies.  Meds: Current Outpatient Medications  Medication Sig Dispense Refill  . albuterol (VENTOLIN HFA) 108 (90 Base) MCG/ACT inhaler Inhale 2 puffs into the lungs every 6 (six) hours as needed for wheezing or shortness of breath. 18 g 6  . allopurinol (ZYLOPRIM) 300 MG tablet Take 300 mg by mouth daily.    Marland Kitchen amLODipine (NORVASC) 10 MG tablet Take 1 tablet (10 mg total) by mouth daily. 90 tablet 3  . atorvastatin (LIPITOR) 20 MG tablet Take 1 tablet (20 mg total) by mouth daily. 90 tablet 3  . azithromycin (ZITHROMAX) 250 MG tablet Sig as indicated 6 tablet 0  . blood glucose meter kit and supplies Per insurance preference. Check blood glucose once a day. Dx E11.9 1 each 3  . Blood Pressure Monitoring (BLOOD PRESSURE MONITOR/ARM) DEVI Check BP once a day. DX I 10 1 each 0  .  cetirizine (ZYRTEC) 10 MG tablet Take 1 tablet (10 mg total) by mouth daily. 30 tablet 11  . furosemide (LASIX) 20 MG tablet Take 20 mg by mouth daily.    Marland Kitchen glipiZIDE (GLUCOTROL) 5 MG tablet Take 1 tablet (5 mg total) by mouth 2 (two) times daily with a meal. 180 tablet 3  . latanoprost (XALATAN) 0.005 % ophthalmic solution     . lisinopril (ZESTRIL) 40 MG tablet Take 1 tablet (40 mg total) by mouth daily. 90 tablet 3  . metFORMIN (GLUCOPHAGE) 500 MG tablet Take 1 tablet (500 mg total) by mouth 2 (two) times daily with a meal. Take 1 tablet by mouth once daily with breakfast 180 tablet 1  . Multiple Vitamins-Minerals (MULTIVITAMIN MEN) TABS Take 1 tablet by mouth daily.    . rosuvastatin (CRESTOR) 20 MG tablet Take 20 mg by mouth at bedtime.    . sitaGLIPtin (JANUVIA) 50 MG tablet Take 1 tablet (50 mg total) by mouth daily. 90 tablet 3   No current facility-administered medications for this encounter.    Physical Findings: The patient is in no acute distress. Patient is alert and oriented.  height is _0  (1.676 m) and weight is 159 lb 2 oz (72.2 kg). His oral temperature is 98 F (36.7 C). His blood pressure is 156/78 (abnormal) and his pulse is 57 (abnormal). His oxygen saturation is 100%. .   Lungs are clear to auscultation bilaterally. Heart has regular rate and rhythm. No palpable cervical, supraclavicular,  or axillary adenopathy. Abdomen soft, non-tender, normal bowel sounds.   Lab Findings: Lab Results  Component Value Date   WBC 5.7 05/30/2019   HGB 12.8 (L) 05/30/2019   HCT 38.5 05/30/2019   MCV 91 05/30/2019   PLT 237 05/30/2019    Radiographic Findings: CT Chest Wo Contrast  Result Date: 01/25/2020 CLINICAL DATA:  Non-small-cell lung cancer.  Restaging. EXAM: CT CHEST WITHOUT CONTRAST TECHNIQUE: Multidetector CT imaging of the chest was performed following the standard protocol without IV contrast. COMPARISON:  06/30/2019 FINDINGS: Cardiovascular: The heart size is normal.  No substantial pericardial effusion. Atherosclerotic calcification is noted in the wall of the thoracic aorta. Mediastinum/Nodes: No mediastinal lymphadenopathy. No evidence for gross hilar lymphadenopathy although assessment is limited by the lack of intravenous contrast on today's study. The esophagus has normal imaging features. There is no axillary lymphadenopathy. Lungs/Pleura: The right upper lobe pulmonary nodule measured on the previous study has become incorporated into a plaque-like area of consolidative opacity compatible with radiation fibrosis. No residual measurable nodule at this time. In the interval since 06/30/2019, the patient has developed a 1.7 x 1.5 cm central right upper lobe suprahilar nodule (see image 53 of series 5). This is at the level of and posterior to the inferior most fiducial marker and no nodule was visible at this location in retrospect. This nodule is also well demonstrated on coronal image 60 of series 6. 7 mm central right upper lobe pulmonary nodule on 62/5 is new in the interval. Also well seen coronal image 61/6. A third 1.4 x 1.0 cm right upper lobe parahilar nodule on 65/5 is new in the interval. Also well seen coronal image 53/6. No pleural effusion. Upper Abdomen: Unremarkable. Musculoskeletal: No worrisome lytic or sclerotic osseous abnormality. IMPRESSION: 1. The right upper lobe pulmonary nodule measured on the previous study has become incorporated into a plaque-like area of consolidative opacity compatible with radiation fibrosis. No residual measurable nodule at this time. 2. Interval development of 3 new parahilar central right upper lobe pulmonary nodules, measuring up to 1.7 cm in size. Imaging features are highly concerning for metastatic disease. PET-CT may prove helpful to further evaluate. 3. Aortic Atherosclerosis (ICD10-I70.0). These results will be called to the ordering clinician or representative by the Radiologist Assistant, and communication  documented in the PACS or [employer]. Electronically Signed   By: Misty Stanley M.D.   On: 01/25/2020 14:23   NM PET Image Restag (PS) Skull Base To Thigh  Result Date: 02/13/2020 CLINICAL DATA:  Subsequent treatment strategy for lung cancer. EXAM: NUCLEAR MEDICINE PET SKULL BASE TO THIGH TECHNIQUE: 8.2 mCi F-18 FDG was injected intravenously. Full-ring PET imaging was performed from the skull base to thigh after the radiotracer. CT data was obtained and used for attenuation correction and anatomic localization. Fasting blood glucose: 138 mg/dl COMPARISON:  CT chest 01/25/2020, 06/17/2017 and PET 07/20/2018. FINDINGS: Mediastinal blood pool activity: SUV max 2.7 Liver activity: SUV max NA NECK: No hypermetabolic lymph nodes. Incidental CT findings: None. CHEST: A 1.5 x 1.5 cm nodule along the inferomedial margin of post radiation changes in the right upper lobe (8/25) has an SUV max 7.6. Two hypermetabolic right upper lobe perihilar nodules measure up to 11 x 12 mm (8/30) with an SUV max of 7.0. No definitive hypermetabolic mediastinal, hilar or axillary lymph nodes. Incidental CT findings: Atherosclerotic calcification of the aorta. Heart is enlarged. No pericardial or pleural effusion. Post radiation ground-glass, bronchiectasis and volume loss in the right upper lobe  with associated fiducial markers. ABDOMEN/PELVIS: No abnormal hypermetabolism in the liver. A 1.0 x 1.6 cm soft tissue nodule in the right adrenal gland has an SUV max of 3.8. Lesion is stable from 06/17/2017. Nodular thickening of the lateral limb left adrenal gland (4/121), with an SUV max of 2.8. Appearance is also stable from 06/17/2017. No abnormal hypermetabolism in the left adrenal gland, spleen or pancreas. No hypermetabolic lymph nodes. Incidental CT findings: Liver, gallbladder, adrenal glands, kidneys, spleen, pancreas, stomach and bowel are otherwise grossly unremarkable. Atherosclerotic calcification of the aorta without  aneurysm. Bilateral inguinal hernias contain fat. Trace pelvic free fluid. SKELETON: No abnormal osseous hypermetabolism. Incidental CT findings: Degenerative changes in the spine. IMPRESSION: 1. Three hypermetabolic right upper lobe nodules, consistent with disease recurrence. 2. Mildly hypermetabolic bilateral adrenal lesions are unchanged in appearance from 06/17/2017, indicative of adenomas. 3.  Aortic atherosclerosis (ICD10-I70.0). 4. Electronically Signed   By: Lorin Picket M.D.   On: 02/13/2020 11:15    Impression: Stage IA2(cT1b, N0)non-small cell lung cancer (adenocarcinoma)  Recent PET scan showed three hypermetabolic right upper lobe nodules that were consistent with disease recurrence.  I have carefully reviewed the patient's PET scan and showed the images to the patient..  These nodules are in very close proximity to 1 another and could be encompassed in a very reasonable radiation field.  we discussed potential options including  retreatment with radiation therapy.  Given his previous SBRT treatment,  he would not be a candidate for SBRT involving these 3 localized nodules.  We also discussed consideration for medical oncology evaluation and discussion at the [facility] conference. at this time the patient feels most comfortable with proceeding with definitive radiation therapy.  We discussed the course of treatment side effects and potential toxicities of radiation therapy particularly in light of his previous radiation treatment.  The patient appears to understand and wishes to proceed with planned course of treatment.  I discussed with patient that I would be happy to discuss treatment with his sister who often brings him in for treatments and follow-ups.  Plan: Patient will return for CT simulation on October 18 with treatments to begin approximately a week later anticipate 6-1/2 weeks of radiation therapy.  Total time spent in this encounter was 35 minutes which included reviewing the  patient's most recent PET scan, physical examination, and documentation  and discussion of upcoming treatment plan.  ____________________________________  Blair Promise, PhD, MD  This document serves as a record of services personally performed by Gery Pray, MD. It was created on his behalf by Clerance Lav, a trained medical scribe. The creation of this record is based on the scribe's personal observations and the provider's statements to them. This document has been checked and approved by the attending provider.

## 2020-02-16 NOTE — Progress Notes (Signed)
Patient here for a f/u visit and to review his PRT scan with Dr. Sondra Come.  No shortness of breath or cough. He denies pain and reports a good appetite.  BP (!) 156/78 (BP Location: Left Arm, Patient Position: Sitting)   Pulse (!) 57   Temp 98 F (36.7 C) (Oral)   Ht 5\' 6"  (1.676 m)   Wt 159 lb 2 oz (72.2 kg)   SpO2 100%   BMI 25.68 kg/m   Wt Readings from Last 3 Encounters:  02/16/20 159 lb 2 oz (72.2 kg)  01/26/20 163 lb (73.9 kg)  12/06/19 161 lb 12.8 oz (73.4 kg)

## 2020-02-20 ENCOUNTER — Other Ambulatory Visit: Payer: Self-pay

## 2020-02-20 ENCOUNTER — Ambulatory Visit
Admission: RE | Admit: 2020-02-20 | Discharge: 2020-02-20 | Disposition: A | Discharge status: Home / Self Care | Payer: Medicare Other | Source: Ambulatory Visit | Attending: Radiation Oncology | Admitting: Radiation Oncology

## 2020-02-20 DIAGNOSIS — C3411 Malignant neoplasm of upper lobe, right bronchus or lung: Secondary | ICD-10-CM

## 2020-02-20 DIAGNOSIS — Z510 Encounter for antineoplastic radiation therapy: Secondary | ICD-10-CM | POA: Insufficient documentation

## 2020-02-23 DIAGNOSIS — Z510 Encounter for antineoplastic radiation therapy: Secondary | ICD-10-CM | POA: Diagnosis not present

## 2020-02-29 ENCOUNTER — Other Ambulatory Visit: Payer: Self-pay

## 2020-02-29 ENCOUNTER — Ambulatory Visit
Admission: RE | Admit: 2020-02-29 | Discharge: 2020-02-29 | Disposition: A | Discharge status: Home / Self Care | Payer: Medicare Other | Source: Ambulatory Visit | Attending: Radiation Oncology | Admitting: Radiation Oncology

## 2020-02-29 ENCOUNTER — Telehealth: Payer: Self-pay | Admitting: Radiation Oncology

## 2020-02-29 DIAGNOSIS — C3411 Malignant neoplasm of upper lobe, right bronchus or lung: Secondary | ICD-10-CM

## 2020-02-29 DIAGNOSIS — Z510 Encounter for antineoplastic radiation therapy: Secondary | ICD-10-CM | POA: Diagnosis not present

## 2020-02-29 NOTE — Telephone Encounter (Signed)
[name] called asking about [employer] for patient to come each day for Radiation tx. I provided the number [phone] for her to call.

## 2020-03-01 ENCOUNTER — Ambulatory Visit (INDEPENDENT_AMBULATORY_CARE_PROVIDER_SITE_OTHER): Payer: Medicare Other | Admitting: Emergency Medicine

## 2020-03-01 ENCOUNTER — Other Ambulatory Visit: Payer: Self-pay

## 2020-03-01 ENCOUNTER — Ambulatory Visit
Admission: RE | Admit: 2020-03-01 | Discharge: 2020-03-01 | Disposition: A | Discharge status: Home / Self Care | Payer: Medicare Other | Source: Ambulatory Visit | Attending: Radiation Oncology | Admitting: Radiation Oncology

## 2020-03-01 ENCOUNTER — Encounter: Payer: Self-pay | Admitting: Emergency Medicine

## 2020-03-01 VITALS — BP 160/80 | HR 52 | Temp 98.0°F | Resp 16 | Ht 66.0 in | Wt 160.0 lb

## 2020-03-01 DIAGNOSIS — I152 Hypertension secondary to endocrine disorders: Secondary | ICD-10-CM | POA: Diagnosis not present

## 2020-03-01 DIAGNOSIS — E1169 Type 2 diabetes mellitus with other specified complication: Secondary | ICD-10-CM

## 2020-03-01 DIAGNOSIS — E1165 Type 2 diabetes mellitus with hyperglycemia: Secondary | ICD-10-CM | POA: Diagnosis not present

## 2020-03-01 DIAGNOSIS — E1159 Type 2 diabetes mellitus with other circulatory complications: Secondary | ICD-10-CM

## 2020-03-01 DIAGNOSIS — E785 Hyperlipidemia, unspecified: Secondary | ICD-10-CM

## 2020-03-01 DIAGNOSIS — Z23 Encounter for immunization: Secondary | ICD-10-CM | POA: Diagnosis not present

## 2020-03-01 DIAGNOSIS — Z510 Encounter for antineoplastic radiation therapy: Secondary | ICD-10-CM | POA: Diagnosis not present

## 2020-03-01 LAB — POCT GLYCOSYLATED HEMOGLOBIN (HGB A1C): Hemoglobin A1C: 7.4 % — AB (ref 4.0–5.6)

## 2020-03-01 LAB — GLUCOSE, POCT (MANUAL RESULT ENTRY): POC Glucose: 159 mg/dl — AB (ref 70–99)

## 2020-03-01 MED ORDER — METFORMIN HCL 500 MG PO TABS: 500.0000 mg | ORAL_TABLET | Freq: Two times a day (BID) | ORAL | 1 refills | Status: DC

## 2020-03-01 MED ORDER — SITAGLIPTIN PHOSPHATE 50 MG PO TABS: 50.0000 mg | ORAL_TABLET | Freq: Every day | ORAL | 3 refills | Status: DC

## 2020-03-01 NOTE — Patient Instructions (Addendum)
If you have lab work done today you will be contacted with your lab results within the next 2 weeks.  If you have not heard from [place] then please contact us. The fastest way to get your results is to register for My Chart.   IF you received an x-ray today, you will receive an invoice from [facility]. Please contact [facility] at [phone] with questions or concerns regarding your invoice.   IF you received labwork today, you will receive an invoice from [facility]. Please contact [facility] at [phone] with questions or concerns regarding your invoice.   Our billing staff will not be able to assist you with questions regarding bills from these companies.  You will be contacted with the lab results as soon as they are available. The fastest way to get your results is to activate your My Chart account. Instructions are located on the last page of this paperwork. If you have not heard from [place] regarding the results in 2 weeks, please contact this office.     Diabetes Mellitus and Nutrition, Adult When you have diabetes (diabetes mellitus), it is very important to have healthy eating habits because your blood sugar (glucose) levels are greatly affected by what you eat and drink. Eating healthy foods in the appropriate amounts, at about the same times every day, can help you:  Control your blood glucose.  Lower your risk of heart disease.  Improve your blood pressure.  Reach or maintain a healthy weight. Every person with diabetes is different, and each person has different needs for a meal plan. Your health care provider may recommend that you work with a diet and nutrition specialist (dietitian) to make a meal plan that is best for you. Your meal plan may vary depending on factors such as:  The calories you need.  The medicines you take.  Your weight.  Your blood glucose, blood pressure, and cholesterol levels.  Your activity level.  Other health conditions  you have, such as heart or kidney disease. How do carbohydrates affect me? Carbohydrates, also called carbs, affect your blood glucose level more than any other type of food. Eating carbs naturally raises the amount of glucose in your blood. Carb counting is a method for keeping track of how many carbs you eat. Counting carbs is important to keep your blood glucose at a healthy level, especially if you use insulin or take certain oral diabetes medicines. It is important to know how many carbs you can safely have in each meal. This is different for every person. Your dietitian can help you calculate how many carbs you should have at each meal and for each snack. Foods that contain carbs include:  Bread, cereal, rice, pasta, and crackers.  Potatoes and corn.  Peas, beans, and lentils.  Milk and yogurt.  Fruit and juice.  Desserts, such as cakes, cookies, ice cream, and candy. How does alcohol affect me? Alcohol can cause a sudden decrease in blood glucose (hypoglycemia), especially if you use insulin or take certain oral diabetes medicines. Hypoglycemia can be a life-threatening condition. Symptoms of hypoglycemia (sleepiness, dizziness, and confusion) are similar to symptoms of having too much alcohol. If your health care provider says that alcohol is safe for you, follow these guidelines:  Limit alcohol intake to no more than 1 drink per day for nonpregnant women and 2 drinks per day for men. One drink equals 12 oz of beer, 5 oz of wine, or 1 oz of hard liquor.  Do not drink on an empty stomach.  Keep yourself hydrated with water, diet soda, or unsweetened iced tea.  Keep in mind that regular soda, juice, and other mixers may contain a lot of sugar and must be counted as carbs. What are tips for following this plan?  Reading food labels  Start by checking the serving size on the "Nutrition Facts" label of packaged foods and drinks. The amount of calories, carbs, fats, and other  nutrients listed on the label is based on one serving of the item. Many items contain more than one serving per package.  Check the total grams (g) of carbs in one serving. You can calculate the number of servings of carbs in one serving by dividing the total carbs by 15. For example, if a food has 30 g of total carbs, it would be equal to 2 servings of carbs.  Check the number of grams (g) of saturated and trans fats in one serving. Choose foods that have low or no amount of these fats.  Check the number of milligrams (mg) of salt (sodium) in one serving. Most people should limit total sodium intake to less than 2,300 mg per day.  Always check the nutrition information of foods labeled as "low-fat" or "nonfat". These foods may be higher in added sugar or refined carbs and should be avoided.  Talk to your dietitian to identify your daily goals for nutrients listed on the label. Shopping  Avoid buying canned, premade, or processed foods. These foods tend to be high in fat, sodium, and added sugar.  Shop around the outside edge of the grocery store. This includes fresh fruits and vegetables, bulk grains, fresh meats, and fresh dairy. Cooking  Use low-heat cooking methods, such as baking, instead of high-heat cooking methods like deep frying.  Cook using healthy oils, such as olive, canola, or sunflower oil.  Avoid cooking with butter, cream, or high-fat meats. Meal planning  Eat meals and snacks regularly, preferably at the same times every day. Avoid going long periods of time without eating.  Eat foods high in fiber, such as fresh fruits, vegetables, beans, and whole grains. Talk to your dietitian about how many servings of carbs you can eat at each meal.  Eat 4-6 ounces (oz) of lean protein each day, such as lean meat, chicken, fish, eggs, or tofu. One oz of lean protein is equal to: ? 1 oz of meat, chicken, or fish. ? 1 egg. ?  cup of tofu.  Eat some foods each day that contain  healthy fats, such as avocado, nuts, seeds, and fish. Lifestyle  Check your blood glucose regularly.  Exercise regularly as told by your health care provider. This may include: ? 150 minutes of moderate-intensity or vigorous-intensity exercise each week. This could be brisk walking, biking, or water aerobics. ? Stretching and doing strength exercises, such as yoga or weightlifting, at least 2 times a week.  Take medicines as told by your health care provider.  Do not use any products that contain nicotine or tobacco, such as cigarettes and e-cigarettes. If you need help quitting, ask your health care provider.  Work with a Social worker or diabetes educator to identify strategies to manage stress and any emotional and social challenges. Questions to ask a health care provider  Do I need to meet with a diabetes educator?  Do I need to meet with a dietitian?  What number can I call if I have questions?  When are the best times to  check my blood glucose? Where to find more information:  American Diabetes Association: diabetes.org  Academy of Nutrition and Dietetics: www.eatright.[employer] of Diabetes and Digestive and Kidney Diseases (NIH): [employer] Summary  A healthy meal plan will help you control your blood glucose and maintain a healthy lifestyle.  Working with a diet and nutrition specialist (dietitian) can help you make a meal plan that is best for you.  Keep in mind that carbohydrates (carbs) and alcohol have immediate effects on your blood glucose levels. It is important to count carbs and to use alcohol carefully. This information is not intended to replace advice given to you by your health care provider. Make sure you discuss any questions you have with your health care provider. Document Revised: 04/03/2017 Document Reviewed: 05/26/2016 Elsevier Patient Education  [place].  Hypertension, Adult High blood pressure (hypertension) is when  the force of blood pumping through the arteries is too strong. The arteries are the blood vessels that carry blood from the heart throughout the body. Hypertension forces the heart to work harder to pump blood and may cause arteries to become narrow or stiff. Untreated or uncontrolled hypertension can cause a heart attack, heart failure, a stroke, kidney disease, and other problems. A blood pressure reading consists of a higher number over a lower number. Ideally, your blood pressure should be below 120/80. The first ("top") number is called the systolic pressure. It is a measure of the pressure in your arteries as your heart beats. The second ("bottom") number is called the diastolic pressure. It is a measure of the pressure in your arteries as the heart relaxes. What are the causes? The exact cause of this condition is not known. There are some conditions that result in or are related to high blood pressure. What increases the risk? Some risk factors for high blood pressure are under your control. The following factors may make you more likely to develop this condition:  Smoking.  Having type 2 diabetes mellitus, high cholesterol, or both.  Not getting enough exercise or physical activity.  Being overweight.  Having too much fat, sugar, calories, or salt (sodium) in your diet.  Drinking too much alcohol. Some risk factors for high blood pressure may be difficult or impossible to change. Some of these factors include:  Having chronic kidney disease.  Having a family history of high blood pressure.  Age. Risk increases with age.  Race. You may be at higher risk if you are African American.  Gender. Men are at higher risk than women before age 68. After age 8, women are at higher risk than men.  Having obstructive sleep apnea.  Stress. What are the signs or symptoms? High blood pressure may not cause symptoms. Very high blood pressure (hypertensive crisis) may  cause:  Headache.  Anxiety.  Shortness of breath.  Nosebleed.  Nausea and vomiting.  Vision changes.  Severe chest pain.  Seizures. How is this diagnosed? This condition is diagnosed by measuring your blood pressure while you are seated, with your arm resting on a flat surface, your legs uncrossed, and your feet flat on the floor. The cuff of the blood pressure monitor will be placed directly against the skin of your upper arm at the level of your heart. It should be measured at least twice using the same arm. Certain conditions can cause a difference in blood pressure between your right and left arms. Certain factors can cause blood pressure readings to be lower or higher  than normal for a short period of time:  When your blood pressure is higher when you are in a health care provider's office than when you are at home, this is called white coat hypertension. Most people with this condition do not need medicines.  When your blood pressure is higher at home than when you are in a health care provider's office, this is called masked hypertension. Most people with this condition may need medicines to control blood pressure. If you have a high blood pressure reading during one visit or you have normal blood pressure with other risk factors, you may be asked to:  Return on a different day to have your blood pressure checked again.  Monitor your blood pressure at home for 1 week or longer. If you are diagnosed with hypertension, you may have other blood or imaging tests to help your health care provider understand your overall risk for other conditions. How is this treated? This condition is treated by making healthy lifestyle changes, such as eating healthy foods, exercising more, and reducing your alcohol intake. Your health care provider may prescribe medicine if lifestyle changes are not enough to get your blood pressure under control, and if:  Your systolic blood pressure is above  130.  Your diastolic blood pressure is above 80. Your personal target blood pressure may vary depending on your medical conditions, your age, and other factors. Follow these instructions at home: Eating and drinking   Eat a diet that is high in fiber and potassium, and low in sodium, added sugar, and fat. An example eating plan is called the DASH (Dietary Approaches to Stop Hypertension) diet. To eat this way: ? Eat plenty of fresh fruits and vegetables. Try to fill one half of your plate at each meal with fruits and vegetables. ? Eat whole grains, such as whole-wheat pasta, brown rice, or whole-grain bread. Fill about one fourth of your plate with whole grains. ? Eat or drink low-fat dairy products, such as skim milk or low-fat yogurt. ? Avoid fatty cuts of meat, processed or cured meats, and poultry with skin. Fill about one fourth of your plate with lean proteins, such as fish, chicken without skin, beans, eggs, or tofu. ? Avoid pre-made and processed foods. These tend to be higher in sodium, added sugar, and fat.  Reduce your daily sodium intake. Most people with hypertension should eat less than 1,500 mg of sodium a day.  Do not drink alcohol if: ? Your health care provider tells you not to drink. ? You are pregnant, may be pregnant, or are planning to become pregnant.  If you drink alcohol: ? Limit how much you use to:  0-1 drink a day for women.  0-2 drinks a day for men. ? Be aware of how much alcohol is in your drink. In the [place] one drink equals one 12 oz bottle of beer (355 mL), one 5 oz glass of wine (148 mL), or one 1 oz glass of hard liquor (44 mL). Lifestyle   Work with your health care provider to maintain a healthy body weight or to lose weight. Ask what an ideal weight is for you.  Get at least 30 minutes of exercise most days of the week. Activities may include walking, swimming, or biking.  Include exercise to strengthen your muscles (resistance exercise),  such as Pilates or lifting weights, as part of your weekly exercise routine. Try to do these types of exercises for 30 minutes at least 3 days a  week.  Do not use any products that contain nicotine or tobacco, such as cigarettes, e-cigarettes, and chewing tobacco. If you need help quitting, ask your health care provider.  Monitor your blood pressure at home as told by your health care provider.  Keep all follow-up visits as told by your health care provider. This is important. Medicines  Take over-the-counter and prescription medicines only as told by your health care provider. Follow directions carefully. Blood pressure medicines must be taken as prescribed.  Do not skip doses of blood pressure medicine. Doing this puts you at risk for problems and can make the medicine less effective.  Ask your health care provider about side effects or reactions to medicines that you should watch for. Contact a health care provider if you:  Think you are having a reaction to a medicine you are taking.  Have headaches that keep coming back (recurring).  Feel dizzy.  Have swelling in your ankles.  Have trouble with your vision. Get help right away if you:  Develop a severe headache or confusion.  Have unusual weakness or numbness.  Feel faint.  Have severe pain in your chest or abdomen.  Vomit repeatedly.  Have trouble breathing. Summary  Hypertension is when the force of blood pumping through your arteries is too strong. If this condition is not controlled, it may put you at risk for serious complications.  Your personal target blood pressure may vary depending on your medical conditions, your age, and other factors. For most people, a normal blood pressure is less than 120/80.  Hypertension is treated with lifestyle changes, medicines, or a combination of both. Lifestyle changes include losing weight, eating a healthy, low-sodium diet, exercising more, and limiting alcohol. This  information is not intended to replace advice given to you by your health care provider. Make sure you discuss any questions you have with your health care provider. Document Revised: 12/30/2017 Document Reviewed: 12/30/2017 Elsevier Patient Education  2020 [employer].

## 2020-03-01 NOTE — Progress Notes (Signed)
Benjamin Willis. 74 y.o.   Chief Complaint  Patient presents with  . Diabetes    follow up 3 month  . Hypertension    HISTORY OF PRESENT ILLNESS: This is a 74 y.o. male with history of diabetes and hypertension here for follow-up.  Doing well.  Compliant with medications.  Has no complaints or medical concerns today.  Medication list reviewed with patient. #1 diabetes: On Metformin and glipizide and Januvia Lab Results  Component Value Date   HGBA1C 8.4 (A) 12/06/2019  #2 hypertension: On amlodipine and lisinopril BP Readings from Last 3 Encounters:  03/01/20 (!) 158/70  02/16/20 (!) 156/78  01/26/20 (!) 151/81  #3 dyslipidemia: On atorvastatin Depression screen [facility] 2/9 03/01/2020 12/06/2019 09/13/2019 09/06/2019 05/30/2019  Decreased Interest 0 0 0 0 0  Down, Depressed, Hopeless 0 0 0 0 0  PHQ - 2 Score 0 0 0 0 0     HPI   Prior to Admission medications   Medication Sig Start Date End Date Taking? Authorizing Provider  albuterol (VENTOLIN HFA) 108 (90 Base) MCG/ACT inhaler Inhale 2 puffs into the lungs every 6 (six) hours as needed for wheezing or shortness of breath. 01/19/19  Yes Icard, Octavio Graves, DO  allopurinol (ZYLOPRIM) 300 MG tablet Take 300 mg by mouth daily.   Yes [provider]  amLODipine (NORVASC) 10 MG tablet Take 1 tablet (10 mg total) by mouth daily. 12/06/19  Yes Zaylia Riolo, Ines Bloomer, MD  atorvastatin (LIPITOR) 20 MG tablet Take 1 tablet (20 mg total) by mouth daily. 09/06/19  Yes Dontavius Keim, Ines Bloomer, MD  cetirizine (ZYRTEC) 10 MG tablet Take 1 tablet (10 mg total) by mouth daily. 09/03/17  Yes Jaynee Eagles, PA-C  furosemide (LASIX) 20 MG tablet Take 20 mg by mouth daily. 12/02/19  Yes [provider]  latanoprost (XALATAN) 0.005 % ophthalmic solution  01/24/20  Yes [provider]  lisinopril (ZESTRIL) 40 MG tablet Take 1 tablet (40 mg total) by mouth daily. 12/06/19 03/05/20 Yes Latima Hamza, Ines Bloomer, MD  Multiple Vitamins-Minerals  (MULTIVITAMIN MEN) TABS Take 1 tablet by mouth daily.   Yes [provider]  rosuvastatin (CRESTOR) 20 MG tablet Take 20 mg by mouth at bedtime. 10/27/19  Yes [provider]  sitaGLIPtin (JANUVIA) 50 MG tablet Take 1 tablet (50 mg total) by mouth daily. 12/06/19 03/05/20 Yes Rieley Hausman, Ines Bloomer, MD  blood glucose meter kit and supplies Per insurance preference. Check blood glucose once a day. Dx E11.9 04/18/19   Horald Pollen, MD  Blood Pressure Monitoring (BLOOD PRESSURE MONITOR/ARM) DEVI Check BP once a day. DX I 10 04/18/19   Horald Pollen, MD  glipiZIDE (GLUCOTROL) 5 MG tablet Take 1 tablet (5 mg total) by mouth 2 (two) times daily with a meal. 09/06/19 02/16/20  Asiana Benninger, Ines Bloomer, MD  metFORMIN (GLUCOPHAGE) 500 MG tablet Take 1 tablet (500 mg total) by mouth 2 (two) times daily with a meal. Take 1 tablet by mouth once daily with breakfast 04/18/19 02/16/20  Horald Pollen, MD    No Known Allergies  Patient Active Problem List   Diagnosis Date Noted  . Primary cancer of right upper lobe of lung ([place]) 02/07/2019  . Lung nodule 02/02/2019  . Hypertension associated with type 2 diabetes mellitus ([place]) 05/24/2018  . Dyslipidemia 05/24/2018  . Dyslipidemia associated with type 2 diabetes mellitus ([place]) 06/09/2017    Past Medical History:  Diagnosis Date  . Allergy   . Diabetes mellitus without complication ([place])  Type II  . Hyperlipidemia   . Hypertension     Past Surgical History:  Procedure Laterality Date  . ELECTROMAGNETIC NAVIGATION BROCHOSCOPY Right 02/02/2019   Procedure: VIDEO BRONCHOSCOPY WITH NAVIGATION AND FIDUCIAL PLACEMENT;  Surgeon: Josephine Igo, DO;  Location: [facility] OR;  Service: Cardiopulmonary;  Laterality: Right;  . FUDUCIAL PLACEMENT Right 02/02/2019   Procedure: Placement Of Fuducial Right Upper Lobe;  Surgeon: Josephine Igo, DO;  Location: [facility] OR;  Service: Cardiopulmonary;  Laterality: Right;  . none    . VIDEO  BRONCHOSCOPY WITH RADIAL ENDOBRONCHIAL ULTRASOUND Right 02/02/2019   Procedure: Video Bronchoscopy With Radial Endobronchial Ultrasound;  Surgeon: Josephine Igo, DO;  Location: [facility] OR;  Service: Cardiopulmonary;  Laterality: Right;    Social History   Socioeconomic History  . Marital status: Single    Spouse name: Not on file  . Number of children: 0  . Years of education: Not on file  . Highest education level: 9th grade  Occupational History  . Not on file  Tobacco Use  . Smoking status: Former Smoker    Packs/day: 1.00    Years: 48.00    Pack years: 48.00    Quit date: 02/20/2015    Years since quitting: 5.0  . Smokeless tobacco: Former Neurosurgeon    Quit date: 10/2013  Vaping Use  . Vaping Use: Never used  Substance and Sexual Activity  . Alcohol use: No  . Drug use: No  . Sexual activity: Yes    Birth control/protection: Condom  Other Topics Concern  . Not on file  Social History Narrative  . Not on file   Social Determinants of Health   Financial Resource Strain:   . Difficulty of Paying Living Expenses: Not on file  Food Insecurity:   . Worried About Programme researcher, broadcasting/film/video in the Last Year: Not on file  . Ran Out of Food in the Last Year: Not on file  Transportation Needs:   . Lack of Transportation (Medical): Not on file  . Lack of Transportation (Non-Medical): Not on file  Physical Activity:   . Days of Exercise per Week: Not on file  . Minutes of Exercise per Session: Not on file  Stress:   . Feeling of Stress : Not on file  Social Connections:   . Frequency of Communication with Friends and Family: Not on file  . Frequency of Social Gatherings with Friends and Family: Not on file  . Attends Religious Services: Not on file  . Active Member of Clubs or Organizations: Not on file  . Attends Banker Meetings: Not on file  . Marital Status: Not on file  Intimate Partner Violence:   . Fear of Current or Ex-Partner: Not on file  . Emotionally  Abused: Not on file  . Physically Abused: Not on file  . Sexually Abused: Not on file    Family History  Problem Relation Age of Onset  . Lung cancer Neg Hx      Review of Systems  Constitutional: Negative.  Negative for chills and fever.  HENT: Negative.  Negative for congestion and sore throat.   Eyes: Negative.   Respiratory: Negative.  Negative for cough and shortness of breath.   Cardiovascular: Negative.  Negative for chest pain and palpitations.  Gastrointestinal: Negative for abdominal pain, blood in stool, diarrhea, melena, nausea and vomiting.  Genitourinary: Negative.  Negative for dysuria.  Musculoskeletal: Negative.  Negative for joint pain and myalgias.  Skin: Negative.  Negative  for rash.  Neurological: Negative.  Negative for dizziness and headaches.  Endo/Heme/Allergies: Negative.   All other systems reviewed and are negative.   Today's Vitals   03/01/20 0813  BP: (!) 158/70  Pulse: (!) 52  Resp: 16  Temp: 98 F (36.7 C)  TempSrc: Temporal  SpO2: 100%  Weight: 160 lb (72.6 kg)  Height: $Remove'5\' 6"'WfnLuWX$  (1.676 m)   Body mass index is 25.82 kg/m.  Physical Exam Vitals reviewed.  Constitutional:      Appearance: Normal appearance.  HENT:     Head: Normocephalic.  Eyes:     Extraocular Movements: Extraocular movements intact.     Pupils: Pupils are equal, round, and reactive to light.  Cardiovascular:     Rate and Rhythm: Normal rate and regular rhythm.     Pulses: Normal pulses.     Heart sounds: Normal heart sounds.  Pulmonary:     Effort: Pulmonary effort is normal.     Breath sounds: Normal breath sounds.  Musculoskeletal:        General: Normal range of motion.     Cervical back: Normal range of motion and neck supple.  Skin:    General: Skin is warm and dry.     Capillary Refill: Capillary refill takes less than 2 seconds.  Neurological:     General: No focal deficit present.     Mental Status: He is alert and oriented to person, place, and  time.  Psychiatric:        Mood and Affect: Mood normal.        Behavior: Behavior normal.    Results for orders placed or performed in visit on 03/01/20 (from the past 24 hour(s))  POCT glucose (manual entry)     Status: Abnormal   Collection Time: 03/01/20  8:27 AM  Result Value Ref Range   POC Glucose 159 (A) 70 - 99 mg/dl  POCT glycosylated hemoglobin (Hb A1C)     Status: Abnormal   Collection Time: 03/01/20  8:29 AM  Result Value Ref Range   Hemoglobin A1C 7.4 (A) 4.0 - 5.6 %   HbA1c POC (<> result, manual entry)     HbA1c, POC (prediabetic range)     HbA1c, POC (controlled diabetic range)       ASSESSMENT & PLAN: Hypertension associated with type 2 diabetes mellitus (HCC) Systolic blood pressure elevated in the office but normal at home.  Continue present medications amlodipine and lisinopril. Hemoglobin A1c improved at 7.4.  Continue present medications Metformin, glipizide, and Januvia.  Diet and nutrition discussed. Follow-up in 3 to 6 months.  Edd was seen today for diabetes and hypertension.  Diagnoses and all orders for this visit:  Hypertension associated with type 2 diabetes mellitus ([place]) -     POCT glucose (manual entry) -     POCT glycosylated hemoglobin (Hb A1C) -     metFORMIN (GLUCOPHAGE) 500 MG tablet; Take 1 tablet (500 mg total) by mouth 2 (two) times daily with a meal. Take 1 tablet by mouth once daily with breakfast  Dyslipidemia associated with type 2 diabetes mellitus ([place])  Type 2 diabetes mellitus with hyperglycemia, without long-term current use of insulin (HCC) -     sitaGLIPtin (JANUVIA) 50 MG tablet; Take 1 tablet (50 mg total) by mouth daily.    Patient Instructions       If you have lab work done today you will be contacted with your lab results within the next 2 weeks.  If you  have not heard from [place] then please contact us. The fastest way to get your results is to register for My Chart.   IF you received an x-ray today, you  will receive an invoice from [facility] Rad[facility]. Please contact [facility] at [phone] with questions or concerns regarding your invoice.   IF you received labwork today, you will receive an invoice from [facility]. Please contact [facility] at [phone] with questions or concerns regarding your invoice.   Our billing staff will not be able to assist you with questions regarding bills from these companies.  You will be contacted with the lab results as soon as they are available. The fastest way to get your results is to activate your My Chart account. Instructions are located on the last page of this paperwork. If you have not heard from [place] regarding the results in 2 weeks, please contact this office.     Diabetes Mellitus and Nutrition, Adult When you have diabetes (diabetes mellitus), it is very important to have healthy eating habits because your blood sugar (glucose) levels are greatly affected by what you eat and drink. Eating healthy foods in the appropriate amounts, at about the same times every day, can help you:  Control your blood glucose.  Lower your risk of heart disease.  Improve your blood pressure.  Reach or maintain a healthy weight. Every person with diabetes is different, and each person has different needs for a meal plan. Your health care provider may recommend that you work with a diet and nutrition specialist (dietitian) to make a meal plan that is best for you. Your meal plan may vary depending on factors such as:  The calories you need.  The medicines you take.  Your weight.  Your blood glucose, blood pressure, and cholesterol levels.  Your activity level.  Other health conditions you have, such as heart or kidney disease. How do carbohydrates affect me? Carbohydrates, also called carbs, affect your blood glucose level more than any other type of food. Eating carbs naturally raises the amount of glucose in your blood. Carb counting is a method  for keeping track of how many carbs you eat. Counting carbs is important to keep your blood glucose at a healthy level, especially if you use insulin or take certain oral diabetes medicines. It is important to know how many carbs you can safely have in each meal. This is different for every person. Your dietitian can help you calculate how many carbs you should have at each meal and for each snack. Foods that contain carbs include:  Bread, cereal, rice, pasta, and crackers.  Potatoes and corn.  Peas, beans, and lentils.  Milk and yogurt.  Fruit and juice.  Desserts, such as cakes, cookies, ice cream, and candy. How does alcohol affect me? Alcohol can cause a sudden decrease in blood glucose (hypoglycemia), especially if you use insulin or take certain oral diabetes medicines. Hypoglycemia can be a life-threatening condition. Symptoms of hypoglycemia (sleepiness, dizziness, and confusion) are similar to symptoms of having too much alcohol. If your health care provider says that alcohol is safe for you, follow these guidelines:  Limit alcohol intake to no more than 1 drink per day for nonpregnant women and 2 drinks per day for men. One drink equals 12 oz of beer, 5 oz of wine, or 1 oz of hard liquor.  Do not drink on an empty stomach.  Keep yourself hydrated with water, diet soda, or unsweetened iced tea.  Keep in mind that  regular soda, juice, and other mixers may contain a lot of sugar and must be counted as carbs. What are tips for following this plan?  Reading food labels  Start by checking the serving size on the "Nutrition Facts" label of packaged foods and drinks. The amount of calories, carbs, fats, and other nutrients listed on the label is based on one serving of the item. Many items contain more than one serving per package.  Check the total grams (g) of carbs in one serving. You can calculate the number of servings of carbs in one serving by dividing the total carbs by 15.  For example, if a food has 30 g of total carbs, it would be equal to 2 servings of carbs.  Check the number of grams (g) of saturated and trans fats in one serving. Choose foods that have low or no amount of these fats.  Check the number of milligrams (mg) of salt (sodium) in one serving. Most people should limit total sodium intake to less than 2,300 mg per day.  Always check the nutrition information of foods labeled as "low-fat" or "nonfat". These foods may be higher in added sugar or refined carbs and should be avoided.  Talk to your dietitian to identify your daily goals for nutrients listed on the label. Shopping  Avoid buying canned, premade, or processed foods. These foods tend to be high in fat, sodium, and added sugar.  Shop around the outside edge of the grocery store. This includes fresh fruits and vegetables, bulk grains, fresh meats, and fresh dairy. Cooking  Use low-heat cooking methods, such as baking, instead of high-heat cooking methods like deep frying.  Cook using healthy oils, such as olive, canola, or sunflower oil.  Avoid cooking with butter, cream, or high-fat meats. Meal planning  Eat meals and snacks regularly, preferably at the same times every day. Avoid going long periods of time without eating.  Eat foods high in fiber, such as fresh fruits, vegetables, beans, and whole grains. Talk to your dietitian about how many servings of carbs you can eat at each meal.  Eat 4-6 ounces (oz) of lean protein each day, such as lean meat, chicken, fish, eggs, or tofu. One oz of lean protein is equal to: ? 1 oz of meat, chicken, or fish. ? 1 egg. ?  cup of tofu.  Eat some foods each day that contain healthy fats, such as avocado, nuts, seeds, and fish. Lifestyle  Check your blood glucose regularly.  Exercise regularly as told by your health care provider. This may include: ? 150 minutes of moderate-intensity or vigorous-intensity exercise each week. This could be  brisk walking, biking, or water aerobics. ? Stretching and doing strength exercises, such as yoga or weightlifting, at least 2 times a week.  Take medicines as told by your health care provider.  Do not use any products that contain nicotine or tobacco, such as cigarettes and e-cigarettes. If you need help quitting, ask your health care provider.  Work with a Social worker or diabetes educator to identify strategies to manage stress and any emotional and social challenges. Questions to ask a health care provider  Do I need to meet with a diabetes educator?  Do I need to meet with a dietitian?  What number can I call if I have questions?  When are the best times to check my blood glucose? Where to find more information:  American Diabetes Association: diabetes.org  Academy of Nutrition and Dietetics: www.eatright.[employer]  of Diabetes and Digestive and Kidney Diseases (NIH): CarFli[employer].tn Summary  A healthy meal plan will help you control your blood glucose and maintain a healthy lifestyle.  Working with a diet and nutrition specialist (dietitian) can help you make a meal plan that is best for you.  Keep in mind that carbohydrates (carbs) and alcohol have immediate effects on your blood glucose levels. It is important to count carbs and to use alcohol carefully. This information is not intended to replace advice given to you by your health care provider. Make sure you discuss any questions you have with your health care provider. Document Revised: 04/03/2017 Document Reviewed: 05/26/2016 Elsevier Patient Education  2020 [employer].  Hypertension, Adult High blood pressure (hypertension) is when the force of blood pumping through the arteries is too strong. The arteries are the blood vessels that carry blood from the heart throughout the body. Hypertension forces the heart to work harder to pump blood and may cause arteries to become narrow or stiff. Untreated or  uncontrolled hypertension can cause a heart attack, heart failure, a stroke, kidney disease, and other problems. A blood pressure reading consists of a higher number over a lower number. Ideally, your blood pressure should be below 120/80. The first ("top") number is called the systolic pressure. It is a measure of the pressure in your arteries as your heart beats. The second ("bottom") number is called the diastolic pressure. It is a measure of the pressure in your arteries as the heart relaxes. What are the causes? The exact cause of this condition is not known. There are some conditions that result in or are related to high blood pressure. What increases the risk? Some risk factors for high blood pressure are under your control. The following factors may make you more likely to develop this condition:  Smoking.  Having type 2 diabetes mellitus, high cholesterol, or both.  Not getting enough exercise or physical activity.  Being overweight.  Having too much fat, sugar, calories, or salt (sodium) in your diet.  Drinking too much alcohol. Some risk factors for high blood pressure may be difficult or impossible to change. Some of these factors include:  Having chronic kidney disease.  Having a family history of high blood pressure.  Age. Risk increases with age.  Race. You may be at higher risk if you are African American.  Gender. Men are at higher risk than women before age 15. After age 30, women are at higher risk than men.  Having obstructive sleep apnea.  Stress. What are the signs or symptoms? High blood pressure may not cause symptoms. Very high blood pressure (hypertensive crisis) may cause:  Headache.  Anxiety.  Shortness of breath.  Nosebleed.  Nausea and vomiting.  Vision changes.  Severe chest pain.  Seizures. How is this diagnosed? This condition is diagnosed by measuring your blood pressure while you are seated, with your arm resting on a flat  surface, your legs uncrossed, and your feet flat on the floor. The cuff of the blood pressure monitor will be placed directly against the skin of your upper arm at the level of your heart. It should be measured at least twice using the same arm. Certain conditions can cause a difference in blood pressure between your right and left arms. Certain factors can cause blood pressure readings to be lower or higher than normal for a short period of time:  When your blood pressure is higher when you are in a health care provider's office  than when you are at home, this is called white coat hypertension. Most people with this condition do not need medicines.  When your blood pressure is higher at home than when you are in a health care provider's office, this is called masked hypertension. Most people with this condition may need medicines to control blood pressure. If you have a high blood pressure reading during one visit or you have normal blood pressure with other risk factors, you may be asked to:  Return on a different day to have your blood pressure checked again.  Monitor your blood pressure at home for 1 week or longer. If you are diagnosed with hypertension, you may have other blood or imaging tests to help your health care provider understand your overall risk for other conditions. How is this treated? This condition is treated by making healthy lifestyle changes, such as eating healthy foods, exercising more, and reducing your alcohol intake. Your health care provider may prescribe medicine if lifestyle changes are not enough to get your blood pressure under control, and if:  Your systolic blood pressure is above 130.  Your diastolic blood pressure is above 80. Your personal target blood pressure may vary depending on your medical conditions, your age, and other factors. Follow these instructions at home: Eating and drinking   Eat a diet that is high in fiber and potassium, and low in  sodium, added sugar, and fat. An example eating plan is called the DASH (Dietary Approaches to Stop Hypertension) diet. To eat this way: ? Eat plenty of fresh fruits and vegetables. Try to fill one half of your plate at each meal with fruits and vegetables. ? Eat whole grains, such as whole-wheat pasta, brown rice, or whole-grain bread. Fill about one fourth of your plate with whole grains. ? Eat or drink low-fat dairy products, such as skim milk or low-fat yogurt. ? Avoid fatty cuts of meat, processed or cured meats, and poultry with skin. Fill about one fourth of your plate with lean proteins, such as fish, chicken without skin, beans, eggs, or tofu. ? Avoid pre-made and processed foods. These tend to be higher in sodium, added sugar, and fat.  Reduce your daily sodium intake. Most people with hypertension should eat less than 1,500 mg of sodium a day.  Do not drink alcohol if: ? Your health care provider tells you not to drink. ? You are pregnant, may be pregnant, or are planning to become pregnant.  If you drink alcohol: ? Limit how much you use to:  0-1 drink a day for women.  0-2 drinks a day for men. ? Be aware of how much alcohol is in your drink. In the [place] one drink equals one 12 oz bottle of beer (355 mL), one 5 oz glass of wine (148 mL), or one 1 oz glass of hard liquor (44 mL). Lifestyle   Work with your health care provider to maintain a healthy body weight or to lose weight. Ask what an ideal weight is for you.  Get at least 30 minutes of exercise most days of the week. Activities may include walking, swimming, or biking.  Include exercise to strengthen your muscles (resistance exercise), such as Pilates or lifting weights, as part of your weekly exercise routine. Try to do these types of exercises for 30 minutes at least 3 days a week.  Do not use any products that contain nicotine or tobacco, such as cigarettes, e-cigarettes, and chewing tobacco. If you need help  quitting,  ask your health care provider.  Monitor your blood pressure at home as told by your health care provider.  Keep all follow-up visits as told by your health care provider. This is important. Medicines  Take over-the-counter and prescription medicines only as told by your health care provider. Follow directions carefully. Blood pressure medicines must be taken as prescribed.  Do not skip doses of blood pressure medicine. Doing this puts you at risk for problems and can make the medicine less effective.  Ask your health care provider about side effects or reactions to medicines that you should watch for. Contact a health care provider if you:  Think you are having a reaction to a medicine you are taking.  Have headaches that keep coming back (recurring).  Feel dizzy.  Have swelling in your ankles.  Have trouble with your vision. Get help right away if you:  Develop a severe headache or confusion.  Have unusual weakness or numbness.  Feel faint.  Have severe pain in your chest or abdomen.  Vomit repeatedly.  Have trouble breathing. Summary  Hypertension is when the force of blood pumping through your arteries is too strong. If this condition is not controlled, it may put you at risk for serious complications.  Your personal target blood pressure may vary depending on your medical conditions, your age, and other factors. For most people, a normal blood pressure is less than 120/80.  Hypertension is treated with lifestyle changes, medicines, or a combination of both. Lifestyle changes include losing weight, eating a healthy, low-sodium diet, exercising more, and limiting alcohol. This information is not intended to replace advice given to you by your health care provider. Make sure you discuss any questions you have with your health care provider. Document Revised: 12/30/2017 Document Reviewed: 12/30/2017 Elsevier Patient Education  2020 Elsevier  Inc.      Agustina Caroli, MD [facility]

## 2020-03-01 NOTE — Assessment & Plan Note (Signed)
Systolic blood pressure elevated in the office but normal at home.  Continue present medications amlodipine and lisinopril. Hemoglobin A1c improved at 7.4.  Continue present medications Metformin, glipizide, and Januvia.  Diet and nutrition discussed. Follow-up in 3 to 6 months.

## 2020-03-02 ENCOUNTER — Ambulatory Visit
Admission: RE | Admit: 2020-03-02 | Discharge: 2020-03-02 | Disposition: A | Discharge status: Home / Self Care | Payer: Medicare Other | Source: Ambulatory Visit | Attending: Radiation Oncology | Admitting: Radiation Oncology

## 2020-03-02 DIAGNOSIS — Z510 Encounter for antineoplastic radiation therapy: Secondary | ICD-10-CM | POA: Diagnosis not present

## 2020-03-05 ENCOUNTER — Other Ambulatory Visit: Payer: Self-pay

## 2020-03-05 ENCOUNTER — Ambulatory Visit
Admission: RE | Admit: 2020-03-05 | Discharge: 2020-03-05 | Disposition: A | Discharge status: Home / Self Care | Payer: Medicare Other | Source: Ambulatory Visit | Attending: Radiation Oncology | Admitting: Radiation Oncology

## 2020-03-05 DIAGNOSIS — C3411 Malignant neoplasm of upper lobe, right bronchus or lung: Secondary | ICD-10-CM | POA: Diagnosis present

## 2020-03-05 DIAGNOSIS — Z510 Encounter for antineoplastic radiation therapy: Secondary | ICD-10-CM | POA: Diagnosis not present

## 2020-03-06 ENCOUNTER — Ambulatory Visit
Admission: RE | Admit: 2020-03-06 | Discharge: 2020-03-06 | Disposition: A | Discharge status: Home / Self Care | Payer: Medicare Other | Source: Ambulatory Visit | Attending: Radiation Oncology | Admitting: Radiation Oncology

## 2020-03-06 ENCOUNTER — Other Ambulatory Visit: Payer: Self-pay

## 2020-03-06 DIAGNOSIS — Z510 Encounter for antineoplastic radiation therapy: Secondary | ICD-10-CM | POA: Diagnosis not present

## 2020-03-07 ENCOUNTER — Ambulatory Visit
Admission: RE | Admit: 2020-03-07 | Discharge: 2020-03-07 | Disposition: A | Discharge status: Home / Self Care | Payer: Medicare Other | Source: Ambulatory Visit | Attending: Radiation Oncology | Admitting: Radiation Oncology

## 2020-03-07 DIAGNOSIS — Z510 Encounter for antineoplastic radiation therapy: Secondary | ICD-10-CM | POA: Diagnosis not present

## 2020-03-08 ENCOUNTER — Ambulatory Visit
Admission: RE | Admit: 2020-03-08 | Discharge: 2020-03-08 | Disposition: A | Discharge status: Home / Self Care | Payer: Medicare Other | Source: Ambulatory Visit | Attending: Radiation Oncology | Admitting: Radiation Oncology

## 2020-03-08 DIAGNOSIS — Z510 Encounter for antineoplastic radiation therapy: Secondary | ICD-10-CM | POA: Diagnosis not present

## 2020-03-09 ENCOUNTER — Ambulatory Visit
Admission: RE | Admit: 2020-03-09 | Discharge: 2020-03-09 | Disposition: A | Discharge status: Home / Self Care | Payer: Medicare Other | Source: Ambulatory Visit | Attending: Radiation Oncology | Admitting: Radiation Oncology

## 2020-03-09 DIAGNOSIS — Z510 Encounter for antineoplastic radiation therapy: Secondary | ICD-10-CM | POA: Diagnosis not present

## 2020-03-12 ENCOUNTER — Ambulatory Visit
Admission: RE | Admit: 2020-03-12 | Discharge: 2020-03-12 | Disposition: A | Discharge status: Home / Self Care | Payer: Medicare Other | Source: Ambulatory Visit | Attending: Radiation Oncology | Admitting: Radiation Oncology

## 2020-03-12 DIAGNOSIS — Z510 Encounter for antineoplastic radiation therapy: Secondary | ICD-10-CM | POA: Diagnosis not present

## 2020-03-13 ENCOUNTER — Ambulatory Visit
Admission: RE | Admit: 2020-03-13 | Discharge: 2020-03-13 | Disposition: A | Discharge status: Home / Self Care | Payer: Medicare Other | Source: Ambulatory Visit | Attending: Radiation Oncology | Admitting: Radiation Oncology

## 2020-03-13 DIAGNOSIS — Z510 Encounter for antineoplastic radiation therapy: Secondary | ICD-10-CM | POA: Diagnosis not present

## 2020-03-14 ENCOUNTER — Ambulatory Visit
Admission: RE | Admit: 2020-03-14 | Discharge: 2020-03-14 | Disposition: A | Discharge status: Home / Self Care | Payer: Medicare Other | Source: Ambulatory Visit | Attending: Radiation Oncology | Admitting: Radiation Oncology

## 2020-03-14 DIAGNOSIS — Z510 Encounter for antineoplastic radiation therapy: Secondary | ICD-10-CM | POA: Diagnosis not present

## 2020-03-15 ENCOUNTER — Ambulatory Visit
Admission: RE | Admit: 2020-03-15 | Discharge: 2020-03-15 | Disposition: A | Discharge status: Home / Self Care | Payer: Medicare Other | Source: Ambulatory Visit | Attending: Radiation Oncology | Admitting: Radiation Oncology

## 2020-03-15 DIAGNOSIS — Z510 Encounter for antineoplastic radiation therapy: Secondary | ICD-10-CM | POA: Diagnosis not present

## 2020-03-16 ENCOUNTER — Ambulatory Visit
Admission: RE | Admit: 2020-03-16 | Discharge: 2020-03-16 | Disposition: A | Discharge status: Home / Self Care | Payer: Medicare Other | Source: Ambulatory Visit | Attending: Radiation Oncology | Admitting: Radiation Oncology

## 2020-03-16 DIAGNOSIS — Z510 Encounter for antineoplastic radiation therapy: Secondary | ICD-10-CM | POA: Diagnosis not present

## 2020-03-19 ENCOUNTER — Ambulatory Visit
Admission: RE | Admit: 2020-03-19 | Discharge: 2020-03-19 | Disposition: A | Discharge status: Home / Self Care | Payer: Medicare Other | Source: Ambulatory Visit | Attending: Radiation Oncology | Admitting: Radiation Oncology

## 2020-03-19 DIAGNOSIS — Z510 Encounter for antineoplastic radiation therapy: Secondary | ICD-10-CM | POA: Diagnosis not present

## 2020-03-20 ENCOUNTER — Ambulatory Visit
Admission: RE | Admit: 2020-03-20 | Discharge: 2020-03-20 | Disposition: A | Discharge status: Home / Self Care | Payer: Medicare Other | Source: Ambulatory Visit | Attending: Radiation Oncology | Admitting: Radiation Oncology

## 2020-03-20 DIAGNOSIS — Z510 Encounter for antineoplastic radiation therapy: Secondary | ICD-10-CM | POA: Diagnosis not present

## 2020-03-21 ENCOUNTER — Other Ambulatory Visit: Payer: Self-pay

## 2020-03-21 ENCOUNTER — Ambulatory Visit
Admission: RE | Admit: 2020-03-21 | Discharge: 2020-03-21 | Disposition: A | Discharge status: Home / Self Care | Payer: Medicare Other | Source: Ambulatory Visit | Attending: Radiation Oncology | Admitting: Radiation Oncology

## 2020-03-21 DIAGNOSIS — Z510 Encounter for antineoplastic radiation therapy: Secondary | ICD-10-CM | POA: Diagnosis not present

## 2020-03-22 ENCOUNTER — Ambulatory Visit
Admission: RE | Admit: 2020-03-22 | Discharge: 2020-03-22 | Disposition: A | Discharge status: Home / Self Care | Payer: Medicare Other | Source: Ambulatory Visit | Attending: Radiation Oncology | Admitting: Radiation Oncology

## 2020-03-22 DIAGNOSIS — Z510 Encounter for antineoplastic radiation therapy: Secondary | ICD-10-CM | POA: Diagnosis not present

## 2020-03-23 ENCOUNTER — Ambulatory Visit
Admission: RE | Admit: 2020-03-23 | Discharge: 2020-03-23 | Disposition: A | Discharge status: Home / Self Care | Payer: Medicare Other | Source: Ambulatory Visit | Attending: Radiation Oncology | Admitting: Radiation Oncology

## 2020-03-23 ENCOUNTER — Other Ambulatory Visit: Payer: Self-pay

## 2020-03-23 DIAGNOSIS — Z510 Encounter for antineoplastic radiation therapy: Secondary | ICD-10-CM | POA: Diagnosis not present

## 2020-03-25 ENCOUNTER — Ambulatory Visit: Payer: Medicare Other

## 2020-03-26 ENCOUNTER — Other Ambulatory Visit: Payer: Self-pay

## 2020-03-26 ENCOUNTER — Ambulatory Visit
Admission: RE | Admit: 2020-03-26 | Discharge: 2020-03-26 | Disposition: A | Discharge status: Home / Self Care | Payer: Medicare Other | Source: Ambulatory Visit | Attending: Radiation Oncology | Admitting: Radiation Oncology

## 2020-03-26 DIAGNOSIS — Z510 Encounter for antineoplastic radiation therapy: Secondary | ICD-10-CM | POA: Diagnosis not present

## 2020-03-27 ENCOUNTER — Ambulatory Visit
Admission: RE | Admit: 2020-03-27 | Discharge: 2020-03-27 | Disposition: A | Discharge status: Home / Self Care | Payer: Medicare Other | Source: Ambulatory Visit | Attending: Radiation Oncology | Admitting: Radiation Oncology

## 2020-03-27 DIAGNOSIS — Z510 Encounter for antineoplastic radiation therapy: Secondary | ICD-10-CM | POA: Diagnosis not present

## 2020-03-28 ENCOUNTER — Ambulatory Visit
Admission: RE | Admit: 2020-03-28 | Discharge: 2020-03-28 | Disposition: A | Discharge status: Home / Self Care | Payer: Medicare Other | Source: Ambulatory Visit | Attending: Radiation Oncology | Admitting: Radiation Oncology

## 2020-03-28 DIAGNOSIS — Z510 Encounter for antineoplastic radiation therapy: Secondary | ICD-10-CM | POA: Diagnosis not present

## 2020-04-02 ENCOUNTER — Ambulatory Visit
Admission: RE | Admit: 2020-04-02 | Discharge: 2020-04-02 | Disposition: A | Discharge status: Home / Self Care | Payer: Medicare Other | Source: Ambulatory Visit | Attending: Radiation Oncology | Admitting: Radiation Oncology

## 2020-04-02 DIAGNOSIS — Z510 Encounter for antineoplastic radiation therapy: Secondary | ICD-10-CM | POA: Diagnosis not present

## 2020-04-03 ENCOUNTER — Ambulatory Visit
Admission: RE | Admit: 2020-04-03 | Discharge: 2020-04-03 | Disposition: A | Discharge status: Home / Self Care | Payer: Medicare Other | Source: Ambulatory Visit | Attending: Radiation Oncology | Admitting: Radiation Oncology

## 2020-04-03 ENCOUNTER — Other Ambulatory Visit: Payer: Self-pay

## 2020-04-03 DIAGNOSIS — Z510 Encounter for antineoplastic radiation therapy: Secondary | ICD-10-CM | POA: Diagnosis not present

## 2020-04-04 ENCOUNTER — Ambulatory Visit
Admission: RE | Admit: 2020-04-04 | Discharge: 2020-04-04 | Disposition: A | Discharge status: Home / Self Care | Payer: Medicare Other | Source: Ambulatory Visit | Attending: Radiation Oncology | Admitting: Radiation Oncology

## 2020-04-04 DIAGNOSIS — Z510 Encounter for antineoplastic radiation therapy: Secondary | ICD-10-CM | POA: Insufficient documentation

## 2020-04-04 DIAGNOSIS — C3411 Malignant neoplasm of upper lobe, right bronchus or lung: Secondary | ICD-10-CM | POA: Diagnosis present

## 2020-04-05 ENCOUNTER — Ambulatory Visit
Admission: RE | Admit: 2020-04-05 | Discharge: 2020-04-05 | Disposition: A | Discharge status: Home / Self Care | Payer: Medicare Other | Source: Ambulatory Visit | Attending: Radiation Oncology | Admitting: Radiation Oncology

## 2020-04-05 DIAGNOSIS — Z510 Encounter for antineoplastic radiation therapy: Secondary | ICD-10-CM | POA: Diagnosis not present

## 2020-04-06 ENCOUNTER — Ambulatory Visit
Admission: RE | Admit: 2020-04-06 | Discharge: 2020-04-06 | Disposition: A | Discharge status: Home / Self Care | Payer: Medicare Other | Source: Ambulatory Visit | Attending: Radiation Oncology | Admitting: Radiation Oncology

## 2020-04-06 DIAGNOSIS — Z510 Encounter for antineoplastic radiation therapy: Secondary | ICD-10-CM | POA: Diagnosis not present

## 2020-04-09 ENCOUNTER — Ambulatory Visit
Admission: RE | Admit: 2020-04-09 | Discharge: 2020-04-09 | Disposition: A | Discharge status: Home / Self Care | Payer: Medicare Other | Source: Ambulatory Visit | Attending: Radiation Oncology | Admitting: Radiation Oncology

## 2020-04-09 DIAGNOSIS — Z510 Encounter for antineoplastic radiation therapy: Secondary | ICD-10-CM | POA: Diagnosis not present

## 2020-04-10 ENCOUNTER — Ambulatory Visit
Admission: RE | Admit: 2020-04-10 | Discharge: 2020-04-10 | Disposition: A | Discharge status: Home / Self Care | Payer: Medicare Other | Source: Ambulatory Visit | Attending: Radiation Oncology | Admitting: Radiation Oncology

## 2020-04-10 DIAGNOSIS — Z510 Encounter for antineoplastic radiation therapy: Secondary | ICD-10-CM | POA: Diagnosis not present

## 2020-04-11 ENCOUNTER — Ambulatory Visit
Admission: RE | Admit: 2020-04-11 | Discharge: 2020-04-11 | Disposition: A | Discharge status: Home / Self Care | Payer: Medicare Other | Source: Ambulatory Visit | Attending: Radiation Oncology | Admitting: Radiation Oncology

## 2020-04-11 ENCOUNTER — Other Ambulatory Visit: Payer: Self-pay

## 2020-04-11 DIAGNOSIS — Z510 Encounter for antineoplastic radiation therapy: Secondary | ICD-10-CM | POA: Diagnosis not present

## 2020-04-12 ENCOUNTER — Ambulatory Visit
Admission: RE | Admit: 2020-04-12 | Discharge: 2020-04-12 | Disposition: A | Discharge status: Home / Self Care | Payer: Medicare Other | Source: Ambulatory Visit | Attending: Radiation Oncology | Admitting: Radiation Oncology

## 2020-04-12 DIAGNOSIS — Z510 Encounter for antineoplastic radiation therapy: Secondary | ICD-10-CM | POA: Diagnosis not present

## 2020-04-13 ENCOUNTER — Ambulatory Visit: Payer: Medicare Other

## 2020-04-13 ENCOUNTER — Ambulatory Visit
Admission: RE | Admit: 2020-04-13 | Discharge: 2020-04-13 | Disposition: A | Discharge status: Home / Self Care | Payer: Medicare Other | Source: Ambulatory Visit | Attending: Radiation Oncology | Admitting: Radiation Oncology

## 2020-04-13 DIAGNOSIS — Z510 Encounter for antineoplastic radiation therapy: Secondary | ICD-10-CM | POA: Diagnosis not present

## 2020-04-16 ENCOUNTER — Ambulatory Visit
Admission: RE | Admit: 2020-04-16 | Discharge: 2020-04-16 | Disposition: A | Discharge status: Home / Self Care | Payer: Medicare Other | Source: Ambulatory Visit | Attending: Radiation Oncology | Admitting: Radiation Oncology

## 2020-04-16 ENCOUNTER — Ambulatory Visit: Payer: Medicare Other

## 2020-04-16 DIAGNOSIS — Z510 Encounter for antineoplastic radiation therapy: Secondary | ICD-10-CM | POA: Diagnosis not present

## 2020-05-16 DIAGNOSIS — Z23 Encounter for immunization: Secondary | ICD-10-CM | POA: Diagnosis not present

## 2020-05-31 ENCOUNTER — Ambulatory Visit (INDEPENDENT_AMBULATORY_CARE_PROVIDER_SITE_OTHER): Payer: Medicare Other | Admitting: Emergency Medicine

## 2020-05-31 ENCOUNTER — Encounter: Payer: Self-pay | Admitting: Emergency Medicine

## 2020-05-31 ENCOUNTER — Other Ambulatory Visit: Payer: Self-pay

## 2020-05-31 VITALS — BP 130/70 | HR 76 | Temp 97.6°F | Resp 16 | Ht 66.0 in | Wt 163.0 lb

## 2020-05-31 DIAGNOSIS — I152 Hypertension secondary to endocrine disorders: Secondary | ICD-10-CM | POA: Diagnosis not present

## 2020-05-31 DIAGNOSIS — E1159 Type 2 diabetes mellitus with other circulatory complications: Secondary | ICD-10-CM | POA: Diagnosis not present

## 2020-05-31 DIAGNOSIS — C3411 Malignant neoplasm of upper lobe, right bronchus or lung: Secondary | ICD-10-CM

## 2020-05-31 DIAGNOSIS — E785 Hyperlipidemia, unspecified: Secondary | ICD-10-CM

## 2020-05-31 DIAGNOSIS — I700 Atherosclerosis of aorta: Secondary | ICD-10-CM | POA: Diagnosis not present

## 2020-05-31 DIAGNOSIS — E1169 Type 2 diabetes mellitus with other specified complication: Secondary | ICD-10-CM

## 2020-05-31 LAB — POCT GLYCOSYLATED HEMOGLOBIN (HGB A1C): Hemoglobin A1C: 7.5 % — AB (ref 4.0–5.6)

## 2020-05-31 LAB — GLUCOSE, POCT (MANUAL RESULT ENTRY): POC Glucose: 162 mg/dl — AB (ref 70–99)

## 2020-05-31 MED ORDER — SITAGLIPTIN PHOSPHATE 100 MG PO TABS: 100.0000 mg | ORAL_TABLET | Freq: Every day | ORAL | 3 refills | Status: DC

## 2020-05-31 NOTE — Progress Notes (Signed)
Benjamin Willis. 75 y.o.   Chief Complaint  Patient presents with  . Diabetes  . Hypertension    Follow up 3 month    HISTORY OF PRESENT ILLNESS: This is a 75 y.o. male with history of hypertension and diabetes here for follow-up and medication refill. #1 diabetes: On Metformin 500 mg twice a day, Januvia 50 mg once a day and glipizide 5 mg twice a day. 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  Sees pulmonary doctor and oncologist and radiation oncologist on a regular basis. Fully vaccinated against Covid. Has no complaints or medical concerns today.  HPI   Prior to Admission medications   Medication Sig Start Date End Date Taking? Authorizing Provider  albuterol (VENTOLIN HFA) 108 (90 Base) MCG/ACT inhaler Inhale 2 puffs into the lungs every 6 (six) hours as needed for wheezing or shortness of breath. 01/19/19  Yes Icard, Octavio Graves, DO  allopurinol (ZYLOPRIM) 300 MG tablet Take 300 mg by mouth daily.   Yes [provider]  amLODipine (NORVASC) 10 MG tablet Take 1 tablet (10 mg total) by mouth daily. 12/06/19  Yes Emmaly Leech, Ines Bloomer, MD  atorvastatin (LIPITOR) 20 MG tablet Take 1 tablet (20 mg total) by mouth daily. 09/06/19  Yes Dorothymae Maciver, Ines Bloomer, MD  blood glucose meter kit and supplies Per insurance preference. Check blood glucose once a day. Dx E11.9 04/18/19  Yes Horald Pollen, MD  Blood Pressure Monitoring (BLOOD PRESSURE MONITOR/ARM) DEVI Check BP once a day. DX I 10 04/18/19  Yes Sargun Rummell, Ines Bloomer, MD  cetirizine (ZYRTEC) 10 MG tablet Take 1 tablet (10 mg total) by mouth daily. 09/03/17  Yes Jaynee Eagles, PA-C  furosemide (LASIX) 20 MG tablet Take 20 mg by mouth daily. 12/02/19  Yes [provider]  latanoprost (XALATAN) 0.005 % ophthalmic solution  01/24/20  Yes [provider]  Multiple  Vitamins-Minerals (MULTIVITAMIN MEN) TABS Take 1 tablet by mouth daily.   Yes [provider]  rosuvastatin (CRESTOR) 20 MG tablet Take 20 mg by mouth at bedtime. 10/27/19  Yes [provider]  glipiZIDE (GLUCOTROL) 5 MG tablet Take 1 tablet (5 mg total) by mouth 2 (two) times daily with a meal. 09/06/19 02/16/20  Ashla Murph, Ines Bloomer, MD  lisinopril (ZESTRIL) 40 MG tablet Take 1 tablet (40 mg total) by mouth daily. 12/06/19 03/05/20  Horald Pollen, MD  metFORMIN (GLUCOPHAGE) 500 MG tablet Take 1 tablet (500 mg total) by mouth 2 (two) times daily with a meal. Take 1 tablet by mouth once daily with breakfast 03/01/20 05/30/20  Horald Pollen, MD  sitaGLIPtin (JANUVIA) 50 MG tablet Take 1 tablet (50 mg total) by mouth daily. 03/01/20 05/30/20  Horald Pollen, MD    No Known Allergies  Patient Active Problem List   Diagnosis Date Noted  . Primary cancer of right upper lobe of lung ([place]) 02/07/2019  . Lung nodule 02/02/2019  . Hypertension associated with type 2 diabetes mellitus ([place]) 05/24/2018  . Dyslipidemia 05/24/2018  . Dyslipidemia associated with type 2 diabetes mellitus ([place]) 06/09/2017    Past Medical History:  Diagnosis Date  . Allergy   . Diabetes mellitus without complication ([place])    Type II  . Hyperlipidemia   . Hypertension     Past Surgical History:  Procedure Laterality Date  . ELECTROMAGNETIC NAVIGATION BROCHOSCOPY Right  02/02/2019   Procedure: VIDEO BRONCHOSCOPY WITH NAVIGATION AND FIDUCIAL PLACEMENT;  Surgeon: Garner Nash, DO;  Location: [facility];  Service: Cardiopulmonary;  Laterality: Right;  . FUDUCIAL PLACEMENT Right 02/02/2019   Procedure: Placement Of Fuducial Right Upper Lobe;  Surgeon: Garner Nash, DO;  Location: [facility];  Service: Cardiopulmonary;  Laterality: Right;  . none    . VIDEO BRONCHOSCOPY WITH RADIAL ENDOBRONCHIAL ULTRASOUND Right 02/02/2019   Procedure: Video Bronchoscopy With Radial Endobronchial Ultrasound;   Surgeon: Garner Nash, DO;  Location: [facility];  Service: Cardiopulmonary;  Laterality: Right;    Social History   Socioeconomic History  . Marital status: Single    Spouse name: Not on file  . Number of children: 0  . Years of education: Not on file  . Highest education level: 9th grade  Occupational History  . Not on file  Tobacco Use  . Smoking status: Former Smoker    Packs/day: 1.00    Years: 48.00    Pack years: 48.00    Quit date: 02/20/2015    Years since quitting: 5.2  . Smokeless tobacco: Former Systems developer    Quit date: 10/2013  Vaping Use  . Vaping Use: Never used  Substance and Sexual Activity  . Alcohol use: No  . Drug use: No  . Sexual activity: Yes    Birth control/protection: Condom  Other Topics Concern  . Not on file  Social History Narrative  . Not on file   Social Determinants of Health   Financial Resource Strain: Not on file  Food Insecurity: Not on file  Transportation Needs: Not on file  Physical Activity: Not on file  Stress: Not on file  Social Connections: Not on file  Intimate Partner Violence: Not on file    Family History  Problem Relation Age of Onset  . Lung cancer Neg Hx      Review of Systems  Constitutional: Negative.  Negative for chills and fever.  HENT: Negative.  Negative for congestion and sore throat.   Respiratory: Negative.  Negative for cough and shortness of breath.   Cardiovascular: Negative.  Negative for chest pain and palpitations.  Gastrointestinal: Negative.  Negative for abdominal pain, blood in stool, diarrhea, melena, nausea and vomiting.  Genitourinary: Negative.  Negative for dysuria and hematuria.  Musculoskeletal: Negative.  Negative for joint pain.  Skin: Negative.  Negative for rash.  Neurological: Negative for dizziness and headaches.  All other systems reviewed and are negative.   Today's Vitals   05/31/20 0805  BP: (!) 150/71  Pulse: 76  Resp: 16  Temp: 97.6 F (36.4 C)  TempSrc: Temporal   SpO2: 100%  Weight: 163 lb (73.9 kg)  Height: _0  (1.676 m)   Body mass index is 26.31 kg/m. Wt Readings from Last 3 Encounters:  05/31/20 163 lb (73.9 kg)  03/01/20 160 lb (72.6 kg)  02/16/20 159 lb 2 oz (72.2 kg)    Physical Exam Vitals reviewed.  Constitutional:      Appearance: Normal appearance.  HENT:     Head: Normocephalic.  Eyes:     Extraocular Movements: Extraocular movements intact.     Conjunctiva/sclera: Conjunctivae normal.     Pupils: Pupils are equal, round, and reactive to light.  Cardiovascular:     Rate and Rhythm: Normal rate and regular rhythm.     Pulses: Normal pulses.     Heart sounds: Normal heart sounds.  Pulmonary:     Effort: Pulmonary effort is normal.  Breath sounds: Normal breath sounds.  Abdominal:     General: There is no distension.     Palpations: Abdomen is soft.     Tenderness: There is no abdominal tenderness. There is no guarding.  Musculoskeletal:        General: Normal range of motion.     Cervical back: Normal range of motion and neck supple. No tenderness.  Lymphadenopathy:     Cervical: No cervical adenopathy.  Skin:    General: Skin is warm and dry.     Capillary Refill: Capillary refill takes less than 2 seconds.  Neurological:     General: No focal deficit present.     Mental Status: He is alert and oriented to person, place, and time.  Psychiatric:        Mood and Affect: Mood normal.        Behavior: Behavior normal.     Results for orders placed or performed in visit on 05/31/20 (from the past 24 hour(s))  POCT glucose (manual entry)     Status: Abnormal   Collection Time: 05/31/20  8:16 AM  Result Value Ref Range   POC Glucose 162 (A) 70 - 99 mg/dl  POCT glycosylated hemoglobin (Hb A1C)     Status: Abnormal   Collection Time: 05/31/20  8:18 AM  Result Value Ref Range   Hemoglobin A1C 7.5 (A) 4.0 - 5.6 %   HbA1c POC (<> result, manual entry)     HbA1c, POC (prediabetic range)     HbA1c, POC  (controlled diabetic range)      ASSESSMENT & PLAN: Hypertension associated with type 2 diabetes mellitus ([place]) Well-controlled hypertension.  Continue glipizide lisinopril.  No dose changes. Hemoglobin A1c is 7.5.  Continue Metformin and glipizide.  Will increase dose of Januvia to 100 mg daily. Diet and nutrition discussed. Continue rosuvastatin. Follow-up in 3 months.  Benjamin Willis was seen today for diabetes and hypertension.  Diagnoses and all orders for this visit:  Hypertension associated with type 2 diabetes mellitus ([place]) -     Ambulatory referral to Ophthalmology -     sitaGLIPtin (JANUVIA) 100 MG tablet; Take 1 tablet (100 mg total) by mouth daily.  Dyslipidemia associated with type 2 diabetes mellitus ([facility]) -     POCT glucose (manual entry) -     POCT glycosylated hemoglobin (Hb A1C)  Atherosclerosis of aorta ([facility])  Primary cancer of right upper lobe of lung [facility])    Patient Instructions       If you have lab work done today you will be contacted with your lab results within the next 2 weeks.  If you have not heard from [place] then please contact us. The fastest way to get your results is to register for My Chart.   IF you received an x-ray today, you will receive an invoice from [facility]. Please contact [facility] at [phone] with questions or concerns regarding your invoice.   IF you received labwork today, you will receive an invoice from [facility]. Please contact [facility] at [phone] with questions or concerns regarding your invoice.   Our billing staff will not be able to assist you with questions regarding bills from these companies.  You will be contacted with the lab results as soon as they are available. The fastest way to get your results is to activate your My Chart account. Instructions are located on the last page of this paperwork. If you have not heard from [place] regarding the results in 2 weeks,  please contact this office.      Diabetes Mellitus and Nutrition, Adult When you have diabetes, or diabetes mellitus, it is very important to have healthy eating habits because your blood sugar (glucose) levels are greatly affected by what you eat and drink. Eating healthy foods in the right amounts, at about the same times every day, can help you:  Control your blood glucose.  Lower your risk of heart disease.  Improve your blood pressure.  Reach or maintain a healthy weight. What can affect my meal plan? Every person with diabetes is different, and each person has different needs for a meal plan. Your health care provider may recommend that you work with a dietitian to make a meal plan that is best for you. Your meal plan may vary depending on factors such as:  The calories you need.  The medicines you take.  Your weight.  Your blood glucose, blood pressure, and cholesterol levels.  Your activity level.  Other health conditions you have, such as heart or kidney disease. How do carbohydrates affect me? Carbohydrates, also called carbs, affect your blood glucose level more than any other type of food. Eating carbs naturally raises the amount of glucose in your blood. Carb counting is a method for keeping track of how many carbs you eat. Counting carbs is important to keep your blood glucose at a healthy level, especially if you use insulin or take certain oral diabetes medicines. It is important to know how many carbs you can safely have in each meal. This is different for every person. Your dietitian can help you calculate how many carbs you should have at each meal and for each snack. How does alcohol affect me? Alcohol can cause a sudden decrease in blood glucose (hypoglycemia), especially if you use insulin or take certain oral diabetes medicines. Hypoglycemia can be a life-threatening condition. Symptoms of hypoglycemia, such as sleepiness, dizziness, and confusion, are similar to symptoms of having too much  alcohol.  Do not drink alcohol if: ? Your health care provider tells you not to drink. ? You are pregnant, may be pregnant, or are planning to become pregnant.  If you drink alcohol: ? Do not drink on an empty stomach. ? Limit how much you use to:  0-1 drink a day for women.  0-2 drinks a day for men. ? Be aware of how much alcohol is in your drink. In the [place] one drink equals one 12 oz bottle of beer (355 mL), one 5 oz glass of wine (148 mL), or one 1 oz glass of hard liquor (44 mL). ? Keep yourself hydrated with water, diet soda, or unsweetened iced tea.  Keep in mind that regular soda, juice, and other mixers may contain a lot of sugar and must be counted as carbs. What are tips for following this plan? Reading food labels  Start by checking the serving size on the "Nutrition Facts" label of packaged foods and drinks. The amount of calories, carbs, fats, and other nutrients listed on the label is based on one serving of the item. Many items contain more than one serving per package.  Check the total grams (g) of carbs in one serving. You can calculate the number of servings of carbs in one serving by dividing the total carbs by 15. For example, if a food has 30 g of total carbs per serving, it would be equal to 2 servings of carbs.  Check the number of grams (g) of saturated fats and  trans fats in one serving. Choose foods that have a low amount or none of these fats.  Check the number of milligrams (mg) of salt (sodium) in one serving. Most people should limit total sodium intake to less than 2,300 mg per day.  Always check the nutrition information of foods labeled as "low-fat" or "nonfat." These foods may be higher in added sugar or refined carbs and should be avoided.  Talk to your dietitian to identify your daily goals for nutrients listed on the label. Shopping  Avoid buying canned, pre-made, or processed foods. These foods tend to be high in fat, sodium, and added  sugar.  Shop around the outside edge of the grocery store. This is where you will most often find fresh fruits and vegetables, bulk grains, fresh meats, and fresh dairy. Cooking  Use low-heat cooking methods, such as baking, instead of high-heat cooking methods like deep frying.  Cook using healthy oils, such as olive, canola, or sunflower oil.  Avoid cooking with butter, cream, or high-fat meats. Meal planning  Eat meals and snacks regularly, preferably at the same times every day. Avoid going long periods of time without eating.  Eat foods that are high in fiber, such as fresh fruits, vegetables, beans, and whole grains. Talk with your dietitian about how many servings of carbs you can eat at each meal.  Eat 4-6 oz (112-168 g) of lean protein each day, such as lean meat, chicken, fish, eggs, or tofu. One ounce (oz) of lean protein is equal to: ? 1 oz (28 g) of meat, chicken, or fish. ? 1 egg. ?  cup (62 g) of tofu.  Eat some foods each day that contain healthy fats, such as avocado, nuts, seeds, and fish.   What foods should I eat? Fruits Berries. Apples. Oranges. Peaches. Apricots. Plums. Grapes. Mango. Papaya. Pomegranate. Kiwi. Cherries. Vegetables Lettuce. Spinach. Leafy greens, including kale, chard, collard greens, and mustard greens. Beets. Cauliflower. Cabbage. Broccoli. Carrots. Green beans. Tomatoes. Peppers. Onions. Cucumbers. Brussels sprouts. Grains Whole grains, such as whole-wheat or whole-grain bread, crackers, tortillas, cereal, and pasta. Unsweetened oatmeal. Quinoa. Brown or wild rice. Meats and other proteins Seafood. Poultry without skin. Lean cuts of poultry and beef. Tofu. Nuts. Seeds. Dairy Low-fat or fat-free dairy products such as milk, yogurt, and cheese. The items listed above may not be a complete list of foods and beverages you can eat. Contact a dietitian for more information. What foods should I avoid? Fruits Fruits canned with  syrup. Vegetables Canned vegetables. Frozen vegetables with butter or cream sauce. Grains Refined white flour and flour products such as bread, pasta, snack foods, and cereals. Avoid all processed foods. Meats and other proteins Fatty cuts of meat. Poultry with skin. Breaded or fried meats. Processed meat. Avoid saturated fats. Dairy Full-fat yogurt, cheese, or milk. Beverages Sweetened drinks, such as soda or iced tea. The items listed above may not be a complete list of foods and beverages you should avoid. Contact a dietitian for more information. Questions to ask a health care provider  Do I need to meet with a diabetes educator?  Do I need to meet with a dietitian?  What number can I call if I have questions?  When are the best times to check my blood glucose? Where to find more information:  American Diabetes Association: diabetes.org  Academy of Nutrition and Dietetics: www.eatright.[employer] of Diabetes and Digestive and Kidney Diseases: [employer]  Association of Diabetes Care and Education Specialists: www.diabeteseducator.org  Summary  It is important to have healthy eating habits because your blood sugar (glucose) levels are greatly affected by what you eat and drink.  A healthy meal plan will help you control your blood glucose and maintain a healthy lifestyle.  Your health care provider may recommend that you work with a dietitian to make a meal plan that is best for you.  Keep in mind that carbohydrates (carbs) and alcohol have immediate effects on your blood glucose levels. It is important to count carbs and to use alcohol carefully. This information is not intended to replace advice given to you by your health care provider. Make sure you discuss any questions you have with your health care provider. Document Revised: 03/29/2019 Document Reviewed: 03/29/2019 Elsevier Patient Education  2021 Elsevier Inc.      Agustina Caroli,  MD [facility]

## 2020-05-31 NOTE — Assessment & Plan Note (Signed)
Well-controlled hypertension.  Continue glipizide lisinopril.  No dose changes. Hemoglobin A1c is 7.5.  Continue Metformin and glipizide.  Will increase dose of Januvia to 100 mg daily. Diet and nutrition discussed. Continue rosuvastatin. Follow-up in 3 months.

## 2020-05-31 NOTE — Patient Instructions (Addendum)
If you have lab work done today you will be contacted with your lab results within the next 2 weeks.  If you have not heard from [place] then please contact us. The fastest way to get your results is to register for My Chart.   IF you received an x-ray today, you will receive an invoice from [facility]. Please contact [facility] at [phone] with questions or concerns regarding your invoice.   IF you received labwork today, you will receive an invoice from [facility]. Please contact [facility] at [phone] with questions or concerns regarding your invoice.   Our billing staff will not be able to assist you with questions regarding bills from these companies.  You will be contacted with the lab results as soon as they are available. The fastest way to get your results is to activate your My Chart account. Instructions are located on the last page of this paperwork. If you have not heard from [place] regarding the results in 2 weeks, please contact this office.     Diabetes Mellitus and Nutrition, Adult When you have diabetes, or diabetes mellitus, it is very important to have healthy eating habits because your blood sugar (glucose) levels are greatly affected by what you eat and drink. Eating healthy foods in the right amounts, at about the same times every day, can help you:  Control your blood glucose.  Lower your risk of heart disease.  Improve your blood pressure.  Reach or maintain a healthy weight. What can affect my meal plan? Every person with diabetes is different, and each person has different needs for a meal plan. Your health care provider may recommend that you work with a dietitian to make a meal plan that is best for you. Your meal plan may vary depending on factors such as:  The calories you need.  The medicines you take.  Your weight.  Your blood glucose, blood pressure, and cholesterol levels.  Your activity level.  Other health conditions you  have, such as heart or kidney disease. How do carbohydrates affect me? Carbohydrates, also called carbs, affect your blood glucose level more than any other type of food. Eating carbs naturally raises the amount of glucose in your blood. Carb counting is a method for keeping track of how many carbs you eat. Counting carbs is important to keep your blood glucose at a healthy level, especially if you use insulin or take certain oral diabetes medicines. It is important to know how many carbs you can safely have in each meal. This is different for every person. Your dietitian can help you calculate how many carbs you should have at each meal and for each snack. How does alcohol affect me? Alcohol can cause a sudden decrease in blood glucose (hypoglycemia), especially if you use insulin or take certain oral diabetes medicines. Hypoglycemia can be a life-threatening condition. Symptoms of hypoglycemia, such as sleepiness, dizziness, and confusion, are similar to symptoms of having too much alcohol.  Do not drink alcohol if: ? Your health care provider tells you not to drink. ? You are pregnant, may be pregnant, or are planning to become pregnant.  If you drink alcohol: ? Do not drink on an empty stomach. ? Limit how much you use to:  0-1 drink a day for women.  0-2 drinks a day for men. ? Be aware of how much alcohol is in your drink. In the [place] one drink equals one 12 oz bottle of beer (355 mL),  one 5 oz glass of wine (148 mL), or one 1 oz glass of hard liquor (44 mL). ? Keep yourself hydrated with water, diet soda, or unsweetened iced tea.  Keep in mind that regular soda, juice, and other mixers may contain a lot of sugar and must be counted as carbs. What are tips for following this plan? Reading food labels  Start by checking the serving size on the "Nutrition Facts" label of packaged foods and drinks. The amount of calories, carbs, fats, and other nutrients listed on the label is based on  one serving of the item. Many items contain more than one serving per package.  Check the total grams (g) of carbs in one serving. You can calculate the number of servings of carbs in one serving by dividing the total carbs by 15. For example, if a food has 30 g of total carbs per serving, it would be equal to 2 servings of carbs.  Check the number of grams (g) of saturated fats and trans fats in one serving. Choose foods that have a low amount or none of these fats.  Check the number of milligrams (mg) of salt (sodium) in one serving. Most people should limit total sodium intake to less than 2,300 mg per day.  Always check the nutrition information of foods labeled as "low-fat" or "nonfat." These foods may be higher in added sugar or refined carbs and should be avoided.  Talk to your dietitian to identify your daily goals for nutrients listed on the label. Shopping  Avoid buying canned, pre-made, or processed foods. These foods tend to be high in fat, sodium, and added sugar.  Shop around the outside edge of the grocery store. This is where you will most often find fresh fruits and vegetables, bulk grains, fresh meats, and fresh dairy. Cooking  Use low-heat cooking methods, such as baking, instead of high-heat cooking methods like deep frying.  Cook using healthy oils, such as olive, canola, or sunflower oil.  Avoid cooking with butter, cream, or high-fat meats. Meal planning  Eat meals and snacks regularly, preferably at the same times every day. Avoid going long periods of time without eating.  Eat foods that are high in fiber, such as fresh fruits, vegetables, beans, and whole grains. Talk with your dietitian about how many servings of carbs you can eat at each meal.  Eat 4-6 oz (112-168 g) of lean protein each day, such as lean meat, chicken, fish, eggs, or tofu. One ounce (oz) of lean protein is equal to: ? 1 oz (28 g) of meat, chicken, or fish. ? 1 egg. ?  cup (62 g) of  tofu.  Eat some foods each day that contain healthy fats, such as avocado, nuts, seeds, and fish.   What foods should I eat? Fruits Berries. Apples. Oranges. Peaches. Apricots. Plums. Grapes. Mango. Papaya. Pomegranate. Kiwi. Cherries. Vegetables Lettuce. Spinach. Leafy greens, including kale, chard, collard greens, and mustard greens. Beets. Cauliflower. Cabbage. Broccoli. Carrots. Green beans. Tomatoes. Peppers. Onions. Cucumbers. Brussels sprouts. Grains Whole grains, such as whole-wheat or whole-grain bread, crackers, tortillas, cereal, and pasta. Unsweetened oatmeal. Quinoa. Brown or wild rice. Meats and other proteins Seafood. Poultry without skin. Lean cuts of poultry and beef. Tofu. Nuts. Seeds. Dairy Low-fat or fat-free dairy products such as milk, yogurt, and cheese. The items listed above may not be a complete list of foods and beverages you can eat. Contact a dietitian for more information. What foods should I avoid? Fruits Fruits canned  with syrup. Vegetables Canned vegetables. Frozen vegetables with butter or cream sauce. Grains Refined white flour and flour products such as bread, pasta, snack foods, and cereals. Avoid all processed foods. Meats and other proteins Fatty cuts of meat. Poultry with skin. Breaded or fried meats. Processed meat. Avoid saturated fats. Dairy Full-fat yogurt, cheese, or milk. Beverages Sweetened drinks, such as soda or iced tea. The items listed above may not be a complete list of foods and beverages you should avoid. Contact a dietitian for more information. Questions to ask a health care provider  Do I need to meet with a diabetes educator?  Do I need to meet with a dietitian?  What number can I call if I have questions?  When are the best times to check my blood glucose? Where to find more information:  American Diabetes Association: diabetes.org  Academy of Nutrition and Dietetics: www.eatright.[employer] of  Diabetes and Digestive and Kidney Diseases: [employer]  Association of Diabetes Care and Education Specialists: www.diabeteseducator.org Summary  It is important to have healthy eating habits because your blood sugar (glucose) levels are greatly affected by what you eat and drink.  A healthy meal plan will help you control your blood glucose and maintain a healthy lifestyle.  Your health care provider may recommend that you work with a dietitian to make a meal plan that is best for you.  Keep in mind that carbohydrates (carbs) and alcohol have immediate effects on your blood glucose levels. It is important to count carbs and to use alcohol carefully. This information is not intended to replace advice given to you by your health care provider. Make sure you discuss any questions you have with your health care provider. Document Revised: 03/29/2019 Document Reviewed: 03/29/2019 Elsevier Patient Education  2021 [employer].

## 2020-09-03 ENCOUNTER — Ambulatory Visit (INDEPENDENT_AMBULATORY_CARE_PROVIDER_SITE_OTHER): Payer: Medicare Other | Admitting: Emergency Medicine

## 2020-09-03 ENCOUNTER — Encounter: Payer: Self-pay | Admitting: Emergency Medicine

## 2020-09-03 ENCOUNTER — Other Ambulatory Visit: Payer: Self-pay

## 2020-09-03 VITALS — BP 138/80 | HR 63 | Temp 98.1°F | Ht 66.0 in | Wt 170.0 lb

## 2020-09-03 DIAGNOSIS — E785 Hyperlipidemia, unspecified: Secondary | ICD-10-CM

## 2020-09-03 DIAGNOSIS — C3411 Malignant neoplasm of upper lobe, right bronchus or lung: Secondary | ICD-10-CM

## 2020-09-03 DIAGNOSIS — I152 Hypertension secondary to endocrine disorders: Secondary | ICD-10-CM

## 2020-09-03 DIAGNOSIS — I700 Atherosclerosis of aorta: Secondary | ICD-10-CM | POA: Diagnosis not present

## 2020-09-03 DIAGNOSIS — E1169 Type 2 diabetes mellitus with other specified complication: Secondary | ICD-10-CM | POA: Diagnosis not present

## 2020-09-03 DIAGNOSIS — E1159 Type 2 diabetes mellitus with other circulatory complications: Secondary | ICD-10-CM | POA: Diagnosis not present

## 2020-09-03 LAB — COMPREHENSIVE METABOLIC PANEL
ALT: 18 U/L (ref 0–53)
AST: 15 U/L (ref 0–37)
Albumin: 4 g/dL (ref 3.5–5.2)
Alkaline Phosphatase: 87 U/L (ref 39–117)
BUN: 13 mg/dL (ref 6–23)
CO2: 28 mEq/L (ref 19–32)
Calcium: 9.2 mg/dL (ref 8.4–10.5)
Chloride: 109 mEq/L (ref 96–112)
Creatinine, Ser: 0.8 mg/dL (ref 0.40–1.50)
GFR: 87.95 mL/min (ref >60.00)
Glucose, Bld: 140 mg/dL — ABNORMAL HIGH (ref 70–99)
Potassium: 4.3 mEq/L (ref 3.5–5.1)
Sodium: 144 mEq/L (ref 135–145)
Total Bilirubin: 0.5 mg/dL (ref 0.2–1.2)
Total Protein: 6.8 g/dL (ref 6.0–8.3)

## 2020-09-03 LAB — POCT GLYCOSYLATED HEMOGLOBIN (HGB A1C): Hemoglobin A1C: 7.4 % — AB (ref 4.0–5.6)

## 2020-09-03 LAB — LIPID PANEL
Cholesterol: 132 mg/dL (ref 0–200)
HDL: 45.9 mg/dL (ref >39.00)
LDL Cholesterol: 72 mg/dL (ref 0–99)
NonHDL: 86.32
Total CHOL/HDL Ratio: 3
Triglycerides: 73 mg/dL (ref 0.0–149.0)
VLDL: 14.6 mg/dL (ref 0.0–40.0)

## 2020-09-03 NOTE — Progress Notes (Signed)
Benjamin Willis. 75 y.o.   Chief Complaint  Patient presents with  . Diabetes    Follow up  . Hypertension  ASSESSMENT & PLAN: Hypertension associated with type 2 diabetes mellitus ([place]) Well-controlled hypertension.  Continue glipizide lisinopril.  No dose changes. Hemoglobin A1c is 7.5.  Continue Metformin and glipizide.  Will increase dose of Januvia to 100 mg daily. Diet and nutrition discussed. Continue rosuvastatin. Follow-up in 3 months.   HISTORY OF PRESENT ILLNESS: This is a 75 y.o. male with history of diabetes and hypertension here for follow-up. 1.  Diabetes: On metformin 500 mg twice a day, glipizide 5 mg twice a day, and Januvia 100 mg daily. 2.  Dyslipidemia: On atorvastatin 20 mg daily. 3.  Hypertension on amlodipine 10 mg daily and lisinopril 40 mg daily. 4.  History of lung cancer.  Stable.  Just finished treatment.  No complications.  No complaints. Has no complaints or medical concerns today.  Lab Results  Component Value Date   HGBA1C 7.5 (A) 05/31/2020   BP Readings from Last 3 Encounters:  05/31/20 130/70  03/01/20 (!) 160/80  02/16/20 (!) 156/78   Lab Results  Component Value Date   CHOL 146 12/06/2019   HDL 48 12/06/2019   LDLCALC 85 12/06/2019   TRIG 62 12/06/2019   CHOLHDL 3.0 12/06/2019   Lab Results  Component Value Date   CREATININE 0.85 12/06/2019   BUN 16 12/06/2019   NA 143 12/06/2019   K 4.3 12/06/2019   CL 107 (H) 12/06/2019   CO2 24 12/06/2019     HPI   Prior to Admission medications   Medication Sig Start Date End Date Taking? Authorizing Provider  albuterol (VENTOLIN HFA) 108 (90 Base) MCG/ACT inhaler Inhale 2 puffs into the lungs every 6 (six) hours as needed for wheezing or shortness of breath. 01/19/19   Icard, Leory Plowman L, DO  amLODipine (NORVASC) 10 MG tablet Take 1 tablet (10 mg total) by mouth daily. 12/06/19   Horald Pollen, MD  atorvastatin (LIPITOR) 20 MG tablet Take 1 tablet (20 mg total) by mouth daily.  09/06/19   Horald Pollen, MD  blood glucose meter kit and supplies Per insurance preference. Check blood glucose once a day. Dx E11.9 04/18/19   Horald Pollen, MD  Blood Pressure Monitoring (BLOOD PRESSURE MONITOR/ARM) DEVI Check BP once a day. DX I 10 04/18/19   Horald Pollen, MD  cetirizine (ZYRTEC) 10 MG tablet Take 1 tablet (10 mg total) by mouth daily. 09/03/17   Jaynee Eagles, PA-C  glipiZIDE (GLUCOTROL) 5 MG tablet Take 1 tablet (5 mg total) by mouth 2 (two) times daily with a meal. 09/06/19 02/16/20  Estephania Licciardi, Ines Bloomer, MD  latanoprost (XALATAN) 0.005 % ophthalmic solution  01/24/20   [provider]  lisinopril (ZESTRIL) 40 MG tablet Take 1 tablet (40 mg total) by mouth daily. 12/06/19 03/05/20  Horald Pollen, MD  metFORMIN (GLUCOPHAGE) 500 MG tablet Take 1 tablet (500 mg total) by mouth 2 (two) times daily with a meal. Take 1 tablet by mouth once daily with breakfast 03/01/20 05/30/20  Horald Pollen, MD  Multiple Vitamins-Minerals (MULTIVITAMIN MEN) TABS Take 1 tablet by mouth daily.    [provider]  rosuvastatin (CRESTOR) 20 MG tablet Take 20 mg by mouth at bedtime. 10/27/19   [provider]  sitaGLIPtin (JANUVIA) 100 MG tablet Take 1 tablet (100 mg total) by mouth daily. 05/31/20 08/29/20  Horald Pollen, MD  No Known Allergies  Patient Active Problem List   Diagnosis Date Noted  . Atherosclerosis of aorta ([place]) 05/31/2020  . Primary cancer of right upper lobe of lung ([place]) 02/07/2019  . Lung nodule 02/02/2019  . Hypertension associated with type 2 diabetes mellitus ([place]) 05/24/2018  . Dyslipidemia 05/24/2018  . Dyslipidemia associated with type 2 diabetes mellitus ([place]) 06/09/2017    Past Medical History:  Diagnosis Date  . Allergy   . Diabetes mellitus without complication ([place])    Type II  . Hyperlipidemia   . Hypertension     Past Surgical History:  Procedure Laterality Date  . ELECTROMAGNETIC  NAVIGATION BROCHOSCOPY Right 02/02/2019   Procedure: VIDEO BRONCHOSCOPY WITH NAVIGATION AND FIDUCIAL PLACEMENT;  Surgeon: Garner Nash, DO;  Location: [facility];  Service: Cardiopulmonary;  Laterality: Right;  . FUDUCIAL PLACEMENT Right 02/02/2019   Procedure: Placement Of Fuducial Right Upper Lobe;  Surgeon: Garner Nash, DO;  Location: [facility];  Service: Cardiopulmonary;  Laterality: Right;  . none    . VIDEO BRONCHOSCOPY WITH RADIAL ENDOBRONCHIAL ULTRASOUND Right 02/02/2019   Procedure: Video Bronchoscopy With Radial Endobronchial Ultrasound;  Surgeon: Garner Nash, DO;  Location: [facility];  Service: Cardiopulmonary;  Laterality: Right;    Social History   Socioeconomic History  . Marital status: Single    Spouse name: Not on file  . Number of children: 0  . Years of education: Not on file  . Highest education level: 9th grade  Occupational History  . Not on file  Tobacco Use  . Smoking status: Former Smoker    Packs/day: 1.00    Years: 48.00    Pack years: 48.00    Quit date: 02/20/2015    Years since quitting: 5.5  . Smokeless tobacco: Former Systems developer    Quit date: 10/2013  Vaping Use  . Vaping Use: Never used  Substance and Sexual Activity  . Alcohol use: No  . Drug use: No  . Sexual activity: Yes    Birth control/protection: Condom  Other Topics Concern  . Not on file  Social History Narrative  . Not on file   Social Determinants of Health   Financial Resource Strain: Not on file  Food Insecurity: Not on file  Transportation Needs: Not on file  Physical Activity: Not on file  Stress: Not on file  Social Connections: Not on file  Intimate Partner Violence: Not on file    Family History  Problem Relation Age of Onset  . Lung cancer Neg Hx      Review of Systems  Constitutional: Negative.  Negative for chills and fever.  Respiratory: Negative.  Negative for cough and shortness of breath.   Cardiovascular: Negative.  Negative for chest pain and palpitations.   Gastrointestinal: Negative for abdominal pain, diarrhea, nausea and vomiting.  Genitourinary: Negative.  Negative for dysuria and hematuria.  Skin: Negative.  Negative for rash.  Neurological: Negative.  Negative for dizziness and headaches.  All other systems reviewed and are negative.  Today's Vitals   09/03/20 0802  BP: (!) 150/88  Pulse: 63  Temp: 98.1 F (36.7 C)  TempSrc: Oral  SpO2: 98%  Weight: 170 lb (77.1 kg)  Height: _0  (1.676 m)   Body mass index is 27.44 kg/m.   Physical Exam Vitals reviewed.  Constitutional:      Appearance: Normal appearance.  HENT:     Head: Normocephalic.  Eyes:     Extraocular Movements: Extraocular movements intact.  Pupils: Pupils are equal, round, and reactive to light.  Cardiovascular:     Rate and Rhythm: Normal rate and regular rhythm.     Pulses: Normal pulses.     Heart sounds: Normal heart sounds.  Pulmonary:     Effort: Pulmonary effort is normal.     Breath sounds: Normal breath sounds.  Musculoskeletal:        General: Normal range of motion.     Cervical back: Normal range of motion and neck supple.  Skin:    General: Skin is warm and dry.     Capillary Refill: Capillary refill takes less than 2 seconds.  Neurological:     General: No focal deficit present.     Mental Status: He is alert and oriented to person, place, and time.  Psychiatric:        Mood and Affect: Mood normal.        Behavior: Behavior normal.    Results for orders placed or performed in visit on 09/03/20 (from the past 24 hour(s))  POCT glycosylated hemoglobin (Hb A1C)     Status: Abnormal   Collection Time: 09/03/20  8:29 AM  Result Value Ref Range   Hemoglobin A1C 7.4 (A) 4.0 - 5.6 %   HbA1c POC (<> result, manual entry)     HbA1c, POC (prediabetic range)     HbA1c, POC (controlled diabetic range)       ASSESSMENT & PLAN: A total of 30 minutes was spent with the patient and counseling/coordination of care regarding diabetes,  hypertension, and dyslipidemia and cardiovascular risks associated with these conditions, review of all medications,, review of most recent office visit notes, review of most recent blood work results including today's hemoglobin A1c, health maintenance items including colon cancer screening and need for colonoscopy for Cologuard test which he says he has at home, review of lung cancer history and treatment, prognosis, documentation, need for following up.  Hypertension associated with type 2 diabetes mellitus ([place]) Well-controlled hypertension.  Continue amlodipine 10 mg daily and lisinopril 40 mg daily. Hemoglobin A1c at an acceptable number 7.4.  Better than before. Continue metformin 500 mg twice a day, glipizide 5 mg twice a day and Januvia 100 mg daily. Diet and nutrition discussed. Follow-up in 6 months.  Primary cancer of right upper lobe of lung ([facility]) Stable.  Finished treatment.  No complications.  Dyslipidemia associated with type 2 diabetes mellitus ([facility]) Fasting lipid profile done today.  Continue atorvastatin 20 mg daily. Diet and nutrition discussed.  Atherosclerosis of aorta ([facility]) Diet and nutrition discussed.  Continue atorvastatin 20 mg daily.  Darrel was seen today for diabetes and hypertension.  Diagnoses and all orders for this visit:  Hypertension associated with type 2 diabetes mellitus ([place]) -     POCT glycosylated hemoglobin (Hb A1C) -     Comprehensive metabolic panel  Atherosclerosis of aorta ([facility])  Primary cancer of right upper lobe of lung ([facility])  Dyslipidemia associated with type 2 diabetes mellitus ([place]) -     Lipid panel    Patient Instructions   Hypertension, Adult High blood pressure (hypertension) is when the force of blood pumping through the arteries is too strong. The arteries are the blood vessels that carry blood from the heart throughout the body. Hypertension forces the heart to work harder to pump blood and may cause arteries to become  narrow or stiff. Untreated or uncontrolled hypertension can cause a heart attack, heart failure, a stroke, kidney disease, and other problems.  A blood pressure reading consists of a higher number over a lower number. Ideally, your blood pressure should be below 120/80. The first ("top") number is called the systolic pressure. It is a measure of the pressure in your arteries as your heart beats. The second ("bottom") number is called the diastolic pressure. It is a measure of the pressure in your arteries as the heart relaxes. What are the causes? The exact cause of this condition is not known. There are some conditions that result in or are related to high blood pressure. What increases the risk? Some risk factors for high blood pressure are under your control. The following factors may make you more likely to develop this condition:  Smoking.  Having type 2 diabetes mellitus, high cholesterol, or both.  Not getting enough exercise or physical activity.  Being overweight.  Having too much fat, sugar, calories, or salt (sodium) in your diet.  Drinking too much alcohol. Some risk factors for high blood pressure may be difficult or impossible to change. Some of these factors include:  Having chronic kidney disease.  Having a family history of high blood pressure.  Age. Risk increases with age.  Race. You may be at higher risk if you are African American.  Gender. Men are at higher risk than women before age 46. After age 5, women are at higher risk than men.  Having obstructive sleep apnea.  Stress. What are the signs or symptoms? High blood pressure may not cause symptoms. Very high blood pressure (hypertensive crisis) may cause:  Headache.  Anxiety.  Shortness of breath.  Nosebleed.  Nausea and vomiting.  Vision changes.  Severe chest pain.  Seizures. How is this diagnosed? This condition is diagnosed by measuring your blood pressure while you are seated, with your  arm resting on a flat surface, your legs uncrossed, and your feet flat on the floor. The cuff of the blood pressure monitor will be placed directly against the skin of your upper arm at the level of your heart. It should be measured at least twice using the same arm. Certain conditions can cause a difference in blood pressure between your right and left arms. Certain factors can cause blood pressure readings to be lower or higher than normal for a short period of time:  When your blood pressure is higher when you are in a health care provider's office than when you are at home, this is called white coat hypertension. Most people with this condition do not need medicines.  When your blood pressure is higher at home than when you are in a health care provider's office, this is called masked hypertension. Most people with this condition may need medicines to control blood pressure. If you have a high blood pressure reading during one visit or you have normal blood pressure with other risk factors, you may be asked to:  Return on a different day to have your blood pressure checked again.  Monitor your blood pressure at home for 1 week or longer. If you are diagnosed with hypertension, you may have other blood or imaging tests to help your health care provider understand your overall risk for other conditions. How is this treated? This condition is treated by making healthy lifestyle changes, such as eating healthy foods, exercising more, and reducing your alcohol intake. Your health care provider may prescribe medicine if lifestyle changes are not enough to get your blood pressure under control, and if:  Your systolic blood pressure is above 130.  Your diastolic blood pressure is above 80. Your personal target blood pressure may vary depending on your medical conditions, your age, and other factors. Follow these instructions at home: Eating and drinking  Eat a diet that is high in fiber and  potassium, and low in sodium, added sugar, and fat. An example eating plan is called the DASH (Dietary Approaches to Stop Hypertension) diet. To eat this way: ? Eat plenty of fresh fruits and vegetables. Try to fill one half of your plate at each meal with fruits and vegetables. ? Eat whole grains, such as whole-wheat pasta, brown rice, or whole-grain bread. Fill about one fourth of your plate with whole grains. ? Eat or drink low-fat dairy products, such as skim milk or low-fat yogurt. ? Avoid fatty cuts of meat, processed or cured meats, and poultry with skin. Fill about one fourth of your plate with lean proteins, such as fish, chicken without skin, beans, eggs, or tofu. ? Avoid pre-made and processed foods. These tend to be higher in sodium, added sugar, and fat.  Reduce your daily sodium intake. Most people with hypertension should eat less than 1,500 mg of sodium a day.  Do not drink alcohol if: ? Your health care provider tells you not to drink. ? You are pregnant, may be pregnant, or are planning to become pregnant.  If you drink alcohol: ? Limit how much you use to:  0-1 drink a day for women.  0-2 drinks a day for men. ? Be aware of how much alcohol is in your drink. In the [place] one drink equals one 12 oz bottle of beer (355 mL), one 5 oz glass of wine (148 mL), or one 1 oz glass of hard liquor (44 mL).   Lifestyle  Work with your health care provider to maintain a healthy body weight or to lose weight. Ask what an ideal weight is for you.  Get at least 30 minutes of exercise most days of the week. Activities may include walking, swimming, or biking.  Include exercise to strengthen your muscles (resistance exercise), such as Pilates or lifting weights, as part of your weekly exercise routine. Try to do these types of exercises for 30 minutes at least 3 days a week.  Do not use any products that contain nicotine or tobacco, such as cigarettes, e-cigarettes, and chewing tobacco.  If you need help quitting, ask your health care provider.  Monitor your blood pressure at home as told by your health care provider.  Keep all follow-up visits as told by your health care provider. This is important.   Medicines  Take over-the-counter and prescription medicines only as told by your health care provider. Follow directions carefully. Blood pressure medicines must be taken as prescribed.  Do not skip doses of blood pressure medicine. Doing this puts you at risk for problems and can make the medicine less effective.  Ask your health care provider about side effects or reactions to medicines that you should watch for. Contact a health care provider if you:  Think you are having a reaction to a medicine you are taking.  Have headaches that keep coming back (recurring).  Feel dizzy.  Have swelling in your ankles.  Have trouble with your vision. Get help right away if you:  Develop a severe headache or confusion.  Have unusual weakness or numbness.  Feel faint.  Have severe pain in your chest or abdomen.  Vomit repeatedly.  Have trouble breathing. Summary  Hypertension is when the  force of blood pumping through your arteries is too strong. If this condition is not controlled, it may put you at risk for serious complications.  Your personal target blood pressure may vary depending on your medical conditions, your age, and other factors. For most people, a normal blood pressure is less than 120/80.  Hypertension is treated with lifestyle changes, medicines, or a combination of both. Lifestyle changes include losing weight, eating a healthy, low-sodium diet, exercising more, and limiting alcohol. This information is not intended to replace advice given to you by your health care provider. Make sure you discuss any questions you have with your health care provider. Document Revised: 12/30/2017 Document Reviewed: 12/30/2017 Elsevier Patient Education  2021 [place]. Diabetes Mellitus and Nutrition, Adult When you have diabetes, or diabetes mellitus, it is very important to have healthy eating habits because your blood sugar (glucose) levels are greatly affected by what you eat and drink. Eating healthy foods in the right amounts, at about the same times every day, can help you:  Control your blood glucose.  Lower your risk of heart disease.  Improve your blood pressure.  Reach or maintain a healthy weight. What can affect my meal plan? Every person with diabetes is different, and each person has different needs for a meal plan. Your health care provider may recommend that you work with a dietitian to make a meal plan that is best for you. Your meal plan may vary depending on factors such as:  The calories you need.  The medicines you take.  Your weight.  Your blood glucose, blood pressure, and cholesterol levels.  Your activity level.  Other health conditions you have, such as heart or kidney disease. How do carbohydrates affect me? Carbohydrates, also called carbs, affect your blood glucose level more than any other type of food. Eating carbs naturally raises the amount of glucose in your blood. Carb counting is a method for keeping track of how many carbs you eat. Counting carbs is important to keep your blood glucose at a healthy level, especially if you use insulin or take certain oral diabetes medicines. It is important to know how many carbs you can safely have in each meal. This is different for every person. Your dietitian can help you calculate how many carbs you should have at each meal and for each snack. How does alcohol affect me? Alcohol can cause a sudden decrease in blood glucose (hypoglycemia), especially if you use insulin or take certain oral diabetes medicines. Hypoglycemia can be a life-threatening condition. Symptoms of hypoglycemia, such as sleepiness, dizziness, and confusion, are similar to symptoms of having too much  alcohol.  Do not drink alcohol if: ? Your health care provider tells you not to drink. ? You are pregnant, may be pregnant, or are planning to become pregnant.  If you drink alcohol: ? Do not drink on an empty stomach. ? Limit how much you use to:  0-1 drink a day for women.  0-2 drinks a day for men. ? Be aware of how much alcohol is in your drink. In the [place] one drink equals one 12 oz bottle of beer (355 mL), one 5 oz glass of wine (148 mL), or one 1 oz glass of hard liquor (44 mL). ? Keep yourself hydrated with water, diet soda, or unsweetened iced tea.  Keep in mind that regular soda, juice, and other mixers may contain a lot of sugar and must be counted as carbs. What are tips for  following this plan? Reading food labels  Start by checking the serving size on the "Nutrition Facts" label of packaged foods and drinks. The amount of calories, carbs, fats, and other nutrients listed on the label is based on one serving of the item. Many items contain more than one serving per package.  Check the total grams (g) of carbs in one serving. You can calculate the number of servings of carbs in one serving by dividing the total carbs by 15. For example, if a food has 30 g of total carbs per serving, it would be equal to 2 servings of carbs.  Check the number of grams (g) of saturated fats and trans fats in one serving. Choose foods that have a low amount or none of these fats.  Check the number of milligrams (mg) of salt (sodium) in one serving. Most people should limit total sodium intake to less than 2,300 mg per day.  Always check the nutrition information of foods labeled as "low-fat" or "nonfat." These foods may be higher in added sugar or refined carbs and should be avoided.  Talk to your dietitian to identify your daily goals for nutrients listed on the label. Shopping  Avoid buying canned, pre-made, or processed foods. These foods tend to be high in fat, sodium, and added  sugar.  Shop around the outside edge of the grocery store. This is where you will most often find fresh fruits and vegetables, bulk grains, fresh meats, and fresh dairy. Cooking  Use low-heat cooking methods, such as baking, instead of high-heat cooking methods like deep frying.  Cook using healthy oils, such as olive, canola, or sunflower oil.  Avoid cooking with butter, cream, or high-fat meats. Meal planning  Eat meals and snacks regularly, preferably at the same times every day. Avoid going long periods of time without eating.  Eat foods that are high in fiber, such as fresh fruits, vegetables, beans, and whole grains. Talk with your dietitian about how many servings of carbs you can eat at each meal.  Eat 4-6 oz (112-168 g) of lean protein each day, such as lean meat, chicken, fish, eggs, or tofu. One ounce (oz) of lean protein is equal to: ? 1 oz (28 g) of meat, chicken, or fish. ? 1 egg. ?  cup (62 g) of tofu.  Eat some foods each day that contain healthy fats, such as avocado, nuts, seeds, and fish.   What foods should I eat? Fruits Berries. Apples. Oranges. Peaches. Apricots. Plums. Grapes. Mango. Papaya. Pomegranate. Kiwi. Cherries. Vegetables Lettuce. Spinach. Leafy greens, including kale, chard, collard greens, and mustard greens. Beets. Cauliflower. Cabbage. Broccoli. Carrots. Green beans. Tomatoes. Peppers. Onions. Cucumbers. Brussels sprouts. Grains Whole grains, such as whole-wheat or whole-grain bread, crackers, tortillas, cereal, and pasta. Unsweetened oatmeal. Quinoa. Brown or wild rice. Meats and other proteins Seafood. Poultry without skin. Lean cuts of poultry and beef. Tofu. Nuts. Seeds. Dairy Low-fat or fat-free dairy products such as milk, yogurt, and cheese. The items listed above may not be a complete list of foods and beverages you can eat. Contact a dietitian for more information. What foods should I avoid? Fruits Fruits canned with  syrup. Vegetables Canned vegetables. Frozen vegetables with butter or cream sauce. Grains Refined white flour and flour products such as bread, pasta, snack foods, and cereals. Avoid all processed foods. Meats and other proteins Fatty cuts of meat. Poultry with skin. Breaded or fried meats. Processed meat. Avoid saturated fats. Dairy Full-fat yogurt, cheese, or milk.  Beverages Sweetened drinks, such as soda or iced tea. The items listed above may not be a complete list of foods and beverages you should avoid. Contact a dietitian for more information. Questions to ask a health care provider  Do I need to meet with a diabetes educator?  Do I need to meet with a dietitian?  What number can I call if I have questions?  When are the best times to check my blood glucose? Where to find more information:  American Diabetes Association: diabetes.org  Academy of Nutrition and Dietetics: www.eatright.[employer] of Diabetes and Digestive and Kidney Diseases: [employer]  Association of Diabetes Care and Education Specialists: www.diabeteseducator.org Summary  It is important to have healthy eating habits because your blood sugar (glucose) levels are greatly affected by what you eat and drink.  A healthy meal plan will help you control your blood glucose and maintain a healthy lifestyle.  Your health care provider may recommend that you work with a dietitian to make a meal plan that is best for you.  Keep in mind that carbohydrates (carbs) and alcohol have immediate effects on your blood glucose levels. It is important to count carbs and to use alcohol carefully. This information is not intended to replace advice given to you by your health care provider. Make sure you discuss any questions you have with your health care provider. Document Revised: 03/29/2019 Document Reviewed: 03/29/2019 Elsevier Patient Education  2021 [place],  MD [facility]

## 2020-09-03 NOTE — Assessment & Plan Note (Signed)
Well-controlled hypertension.  Continue amlodipine 10 mg daily and lisinopril 40 mg daily. Hemoglobin A1c at an acceptable number 7.4.  Better than before. Continue metformin 500 mg twice a day, glipizide 5 mg twice a day and Januvia 100 mg daily. Diet and nutrition discussed. Follow-up in 6 months.

## 2020-09-03 NOTE — Assessment & Plan Note (Signed)
Stable.  Finished treatment.  No complications.

## 2020-09-03 NOTE — Assessment & Plan Note (Signed)
Diet and nutrition discussed.  Continue atorvastatin 20 mg daily.

## 2020-09-03 NOTE — Patient Instructions (Signed)
Hypertension, Adult High blood pressure (hypertension) is when the force of blood pumping through the arteries is too strong. The arteries are the blood vessels that carry blood from the heart throughout the body. Hypertension forces the heart to work harder to pump blood and may cause arteries to become narrow or stiff. Untreated or uncontrolled hypertension can cause a heart attack, heart failure, a stroke, kidney disease, and other problems. A blood pressure reading consists of a higher number over a lower number. Ideally, your blood pressure should be below 120/80. The first ("top") number is called the systolic pressure. It is a measure of the pressure in your arteries as your heart beats. The second ("bottom") number is called the diastolic pressure. It is a measure of the pressure in your arteries as the heart relaxes. What are the causes? The exact cause of this condition is not known. There are some conditions that result in or are related to high blood pressure. What increases the risk? Some risk factors for high blood pressure are under your control. The following factors may make you more likely to develop this condition:  Smoking.  Having type 2 diabetes mellitus, high cholesterol, or both.  Not getting enough exercise or physical activity.  Being overweight.  Having too much fat, sugar, calories, or salt (sodium) in your diet.  Drinking too much alcohol. Some risk factors for high blood pressure may be difficult or impossible to change. Some of these factors include:  Having chronic kidney disease.  Having a family history of high blood pressure.  Age. Risk increases with age.  Race. You may be at higher risk if you are African American.  Gender. Men are at higher risk than women before age 24. After age 87, women are at higher risk than men.  Having obstructive sleep apnea.  Stress. What are the signs or symptoms? High blood pressure may not cause symptoms. Very high  blood pressure (hypertensive crisis) may cause:  Headache.  Anxiety.  Shortness of breath.  Nosebleed.  Nausea and vomiting.  Vision changes.  Severe chest pain.  Seizures. How is this diagnosed? This condition is diagnosed by measuring your blood pressure while you are seated, with your arm resting on a flat surface, your legs uncrossed, and your feet flat on the floor. The cuff of the blood pressure monitor will be placed directly against the skin of your upper arm at the level of your heart. It should be measured at least twice using the same arm. Certain conditions can cause a difference in blood pressure between your right and left arms. Certain factors can cause blood pressure readings to be lower or higher than normal for a short period of time:  When your blood pressure is higher when you are in a health care provider's office than when you are at home, this is called white coat hypertension. Most people with this condition do not need medicines.  When your blood pressure is higher at home than when you are in a health care provider's office, this is called masked hypertension. Most people with this condition may need medicines to control blood pressure. If you have a high blood pressure reading during one visit or you have normal blood pressure with other risk factors, you may be asked to:  Return on a different day to have your blood pressure checked again.  Monitor your blood pressure at home for 1 week or longer. If you are diagnosed with hypertension, you may have other blood or  imaging tests to help your health care provider understand your overall risk for other conditions. How is this treated? This condition is treated by making healthy lifestyle changes, such as eating healthy foods, exercising more, and reducing your alcohol intake. Your health care provider may prescribe medicine if lifestyle changes are not enough to get your blood pressure under control, and  if:  Your systolic blood pressure is above 130.  Your diastolic blood pressure is above 80. Your personal target blood pressure may vary depending on your medical conditions, your age, and other factors. Follow these instructions at home: Eating and drinking  Eat a diet that is high in fiber and potassium, and low in sodium, added sugar, and fat. An example eating plan is called the DASH (Dietary Approaches to Stop Hypertension) diet. To eat this way: ? Eat plenty of fresh fruits and vegetables. Try to fill one half of your plate at each meal with fruits and vegetables. ? Eat whole grains, such as whole-wheat pasta, brown rice, or whole-grain bread. Fill about one fourth of your plate with whole grains. ? Eat or drink low-fat dairy products, such as skim milk or low-fat yogurt. ? Avoid fatty cuts of meat, processed or cured meats, and poultry with skin. Fill about one fourth of your plate with lean proteins, such as fish, chicken without skin, beans, eggs, or tofu. ? Avoid pre-made and processed foods. These tend to be higher in sodium, added sugar, and fat.  Reduce your daily sodium intake. Most people with hypertension should eat less than 1,500 mg of sodium a day.  Do not drink alcohol if: ? Your health care provider tells you not to drink. ? You are pregnant, may be pregnant, or are planning to become pregnant.  If you drink alcohol: ? Limit how much you use to:  0-1 drink a day for women.  0-2 drinks a day for men. ? Be aware of how much alcohol is in your drink. In the [place] one drink equals one 12 oz bottle of beer (355 mL), one 5 oz glass of wine (148 mL), or one 1 oz glass of hard liquor (44 mL).   Lifestyle  Work with your health care provider to maintain a healthy body weight or to lose weight. Ask what an ideal weight is for you.  Get at least 30 minutes of exercise most days of the week. Activities may include walking, swimming, or biking.  Include exercise to  strengthen your muscles (resistance exercise), such as Pilates or lifting weights, as part of your weekly exercise routine. Try to do these types of exercises for 30 minutes at least 3 days a week.  Do not use any products that contain nicotine or tobacco, such as cigarettes, e-cigarettes, and chewing tobacco. If you need help quitting, ask your health care provider.  Monitor your blood pressure at home as told by your health care provider.  Keep all follow-up visits as told by your health care provider. This is important.   Medicines  Take over-the-counter and prescription medicines only as told by your health care provider. Follow directions carefully. Blood pressure medicines must be taken as prescribed.  Do not skip doses of blood pressure medicine. Doing this puts you at risk for problems and can make the medicine less effective.  Ask your health care provider about side effects or reactions to medicines that you should watch for. Contact a health care provider if you:  Think you are having a reaction to a  medicine you are taking.  Have headaches that keep coming back (recurring).  Feel dizzy.  Have swelling in your ankles.  Have trouble with your vision. Get help right away if you:  Develop a severe headache or confusion.  Have unusual weakness or numbness.  Feel faint.  Have severe pain in your chest or abdomen.  Vomit repeatedly.  Have trouble breathing. Summary  Hypertension is when the force of blood pumping through your arteries is too strong. If this condition is not controlled, it may put you at risk for serious complications.  Your personal target blood pressure may vary depending on your medical conditions, your age, and other factors. For most people, a normal blood pressure is less than 120/80.  Hypertension is treated with lifestyle changes, medicines, or a combination of both. Lifestyle changes include losing weight, eating a healthy, low-sodium diet,  exercising more, and limiting alcohol. This information is not intended to replace advice given to you by your health care provider. Make sure you discuss any questions you have with your health care provider. Document Revised: 12/30/2017 Document Reviewed: 12/30/2017 Elsevier Patient Education  2021 [employer]. Diabetes Mellitus and Nutrition, Adult When you have diabetes, or diabetes mellitus, it is very important to have healthy eating habits because your blood sugar (glucose) levels are greatly affected by what you eat and drink. Eating healthy foods in the right amounts, at about the same times every day, can help you:  Control your blood glucose.  Lower your risk of heart disease.  Improve your blood pressure.  Reach or maintain a healthy weight. What can affect my meal plan? Every person with diabetes is different, and each person has different needs for a meal plan. Your health care provider may recommend that you work with a dietitian to make a meal plan that is best for you. Your meal plan may vary depending on factors such as:  The calories you need.  The medicines you take.  Your weight.  Your blood glucose, blood pressure, and cholesterol levels.  Your activity level.  Other health conditions you have, such as heart or kidney disease. How do carbohydrates affect me? Carbohydrates, also called carbs, affect your blood glucose level more than any other type of food. Eating carbs naturally raises the amount of glucose in your blood. Carb counting is a method for keeping track of how many carbs you eat. Counting carbs is important to keep your blood glucose at a healthy level, especially if you use insulin or take certain oral diabetes medicines. It is important to know how many carbs you can safely have in each meal. This is different for every person. Your dietitian can help you calculate how many carbs you should have at each meal and for each snack. How does alcohol  affect me? Alcohol can cause a sudden decrease in blood glucose (hypoglycemia), especially if you use insulin or take certain oral diabetes medicines. Hypoglycemia can be a life-threatening condition. Symptoms of hypoglycemia, such as sleepiness, dizziness, and confusion, are similar to symptoms of having too much alcohol.  Do not drink alcohol if: ? Your health care provider tells you not to drink. ? You are pregnant, may be pregnant, or are planning to become pregnant.  If you drink alcohol: ? Do not drink on an empty stomach. ? Limit how much you use to:  0-1 drink a day for women.  0-2 drinks a day for men. ? Be aware of how much alcohol is in your drink.  In the [place] one drink equals one 12 oz bottle of beer (355 mL), one 5 oz glass of wine (148 mL), or one 1 oz glass of hard liquor (44 mL). ? Keep yourself hydrated with water, diet soda, or unsweetened iced tea.  Keep in mind that regular soda, juice, and other mixers may contain a lot of sugar and must be counted as carbs. What are tips for following this plan? Reading food labels  Start by checking the serving size on the "Nutrition Facts" label of packaged foods and drinks. The amount of calories, carbs, fats, and other nutrients listed on the label is based on one serving of the item. Many items contain more than one serving per package.  Check the total grams (g) of carbs in one serving. You can calculate the number of servings of carbs in one serving by dividing the total carbs by 15. For example, if a food has 30 g of total carbs per serving, it would be equal to 2 servings of carbs.  Check the number of grams (g) of saturated fats and trans fats in one serving. Choose foods that have a low amount or none of these fats.  Check the number of milligrams (mg) of salt (sodium) in one serving. Most people should limit total sodium intake to less than 2,300 mg per day.  Always check the nutrition information of foods labeled as  "low-fat" or "nonfat." These foods may be higher in added sugar or refined carbs and should be avoided.  Talk to your dietitian to identify your daily goals for nutrients listed on the label. Shopping  Avoid buying canned, pre-made, or processed foods. These foods tend to be high in fat, sodium, and added sugar.  Shop around the outside edge of the grocery store. This is where you will most often find fresh fruits and vegetables, bulk grains, fresh meats, and fresh dairy. Cooking  Use low-heat cooking methods, such as baking, instead of high-heat cooking methods like deep frying.  Cook using healthy oils, such as olive, canola, or sunflower oil.  Avoid cooking with butter, cream, or high-fat meats. Meal planning  Eat meals and snacks regularly, preferably at the same times every day. Avoid going long periods of time without eating.  Eat foods that are high in fiber, such as fresh fruits, vegetables, beans, and whole grains. Talk with your dietitian about how many servings of carbs you can eat at each meal.  Eat 4-6 oz (112-168 g) of lean protein each day, such as lean meat, chicken, fish, eggs, or tofu. One ounce (oz) of lean protein is equal to: ? 1 oz (28 g) of meat, chicken, or fish. ? 1 egg. ?  cup (62 g) of tofu.  Eat some foods each day that contain healthy fats, such as avocado, nuts, seeds, and fish.   What foods should I eat? Fruits Berries. Apples. Oranges. Peaches. Apricots. Plums. Grapes. Mango. Papaya. Pomegranate. Kiwi. Cherries. Vegetables Lettuce. Spinach. Leafy greens, including kale, chard, collard greens, and mustard greens. Beets. Cauliflower. Cabbage. Broccoli. Carrots. Green beans. Tomatoes. Peppers. Onions. Cucumbers. Brussels sprouts. Grains Whole grains, such as whole-wheat or whole-grain bread, crackers, tortillas, cereal, and pasta. Unsweetened oatmeal. Quinoa. Brown or wild rice. Meats and other proteins Seafood. Poultry without skin. Lean cuts of  poultry and beef. Tofu. Nuts. Seeds. Dairy Low-fat or fat-free dairy products such as milk, yogurt, and cheese. The items listed above may not be a complete list of foods and beverages you can eat.  Contact a dietitian for more information. What foods should I avoid? Fruits Fruits canned with syrup. Vegetables Canned vegetables. Frozen vegetables with butter or cream sauce. Grains Refined white flour and flour products such as bread, pasta, snack foods, and cereals. Avoid all processed foods. Meats and other proteins Fatty cuts of meat. Poultry with skin. Breaded or fried meats. Processed meat. Avoid saturated fats. Dairy Full-fat yogurt, cheese, or milk. Beverages Sweetened drinks, such as soda or iced tea. The items listed above may not be a complete list of foods and beverages you should avoid. Contact a dietitian for more information. Questions to ask a health care provider  Do I need to meet with a diabetes educator?  Do I need to meet with a dietitian?  What number can I call if I have questions?  When are the best times to check my blood glucose? Where to find more information:  American Diabetes Association: diabetes.org  Academy of Nutrition and Dietetics: www.eatright.[employer] of Diabetes and Digestive and Kidney Diseases: [employer]  Association of Diabetes Care and Education Specialists: www.diabeteseducator.org Summary  It is important to have healthy eating habits because your blood sugar (glucose) levels are greatly affected by what you eat and drink.  A healthy meal plan will help you control your blood glucose and maintain a healthy lifestyle.  Your health care provider may recommend that you work with a dietitian to make a meal plan that is best for you.  Keep in mind that carbohydrates (carbs) and alcohol have immediate effects on your blood glucose levels. It is important to count carbs and to use alcohol carefully. This  information is not intended to replace advice given to you by your health care provider. Make sure you discuss any questions you have with your health care provider. Document Revised: 03/29/2019 Document Reviewed: 03/29/2019 Elsevier Patient Education  2021 [employer].

## 2020-09-03 NOTE — Assessment & Plan Note (Signed)
Fasting lipid profile done today.  Continue atorvastatin 20 mg daily. Diet and nutrition discussed.

## 2020-10-05 ENCOUNTER — Other Ambulatory Visit: Payer: Self-pay | Admitting: Emergency Medicine

## 2020-10-05 DIAGNOSIS — E785 Hyperlipidemia, unspecified: Secondary | ICD-10-CM

## 2020-10-08 ENCOUNTER — Other Ambulatory Visit: Payer: Self-pay | Admitting: Emergency Medicine

## 2020-10-08 DIAGNOSIS — I152 Hypertension secondary to endocrine disorders: Secondary | ICD-10-CM

## 2020-10-08 DIAGNOSIS — E785 Hyperlipidemia, unspecified: Secondary | ICD-10-CM

## 2020-10-08 DIAGNOSIS — E1159 Type 2 diabetes mellitus with other circulatory complications: Secondary | ICD-10-CM

## 2020-10-11 ENCOUNTER — Encounter (HOSPITAL_COMMUNITY): Payer: Self-pay | Admitting: Emergency Medicine

## 2020-10-11 ENCOUNTER — Other Ambulatory Visit: Payer: Self-pay

## 2020-10-11 ENCOUNTER — Emergency Department (HOSPITAL_COMMUNITY): Payer: Medicare Other

## 2020-10-11 ENCOUNTER — Emergency Department (HOSPITAL_COMMUNITY)
Admission: EM | Admit: 2020-10-11 | Discharge: 2020-10-11 | Disposition: A | Discharge status: Home / Self Care | Payer: Medicare Other | Attending: Emergency Medicine | Admitting: Emergency Medicine

## 2020-10-11 DIAGNOSIS — Z79899 Other long term (current) drug therapy: Secondary | ICD-10-CM | POA: Diagnosis not present

## 2020-10-11 DIAGNOSIS — R059 Cough, unspecified: Secondary | ICD-10-CM | POA: Diagnosis not present

## 2020-10-11 DIAGNOSIS — Z87891 Personal history of nicotine dependence: Secondary | ICD-10-CM | POA: Diagnosis not present

## 2020-10-11 DIAGNOSIS — E119 Type 2 diabetes mellitus without complications: Secondary | ICD-10-CM | POA: Diagnosis not present

## 2020-10-11 DIAGNOSIS — Z7984 Long term (current) use of oral hypoglycemic drugs: Secondary | ICD-10-CM | POA: Diagnosis not present

## 2020-10-11 DIAGNOSIS — Z20822 Contact with and (suspected) exposure to covid-19: Secondary | ICD-10-CM | POA: Insufficient documentation

## 2020-10-11 DIAGNOSIS — I10 Essential (primary) hypertension: Secondary | ICD-10-CM | POA: Insufficient documentation

## 2020-10-11 DIAGNOSIS — Z85118 Personal history of other malignant neoplasm of bronchus and lung: Secondary | ICD-10-CM | POA: Insufficient documentation

## 2020-10-11 DIAGNOSIS — R0600 Dyspnea, unspecified: Secondary | ICD-10-CM | POA: Diagnosis not present

## 2020-10-11 DIAGNOSIS — C3490 Malignant neoplasm of unspecified part of unspecified bronchus or lung: Secondary | ICD-10-CM | POA: Diagnosis not present

## 2020-10-11 LAB — RESP PANEL BY RT-PCR (FLU A&B, COVID) ARPGX2
Influenza A by PCR: NEGATIVE
Influenza B by PCR: NEGATIVE
SARS Coronavirus 2 by RT PCR: NEGATIVE

## 2020-10-11 IMAGING — DX DG CHEST 2V
2 series · 2 of 2 positions shown · non-contrast
Comparison: [DATE] chest radiograph.  [DATE] chest CT.

CLINICAL DATA: Cough and dyspnea today. Non-small cell right lung
cancer.

EXAM:
CHEST - 2 VIEW

[w chest pa]
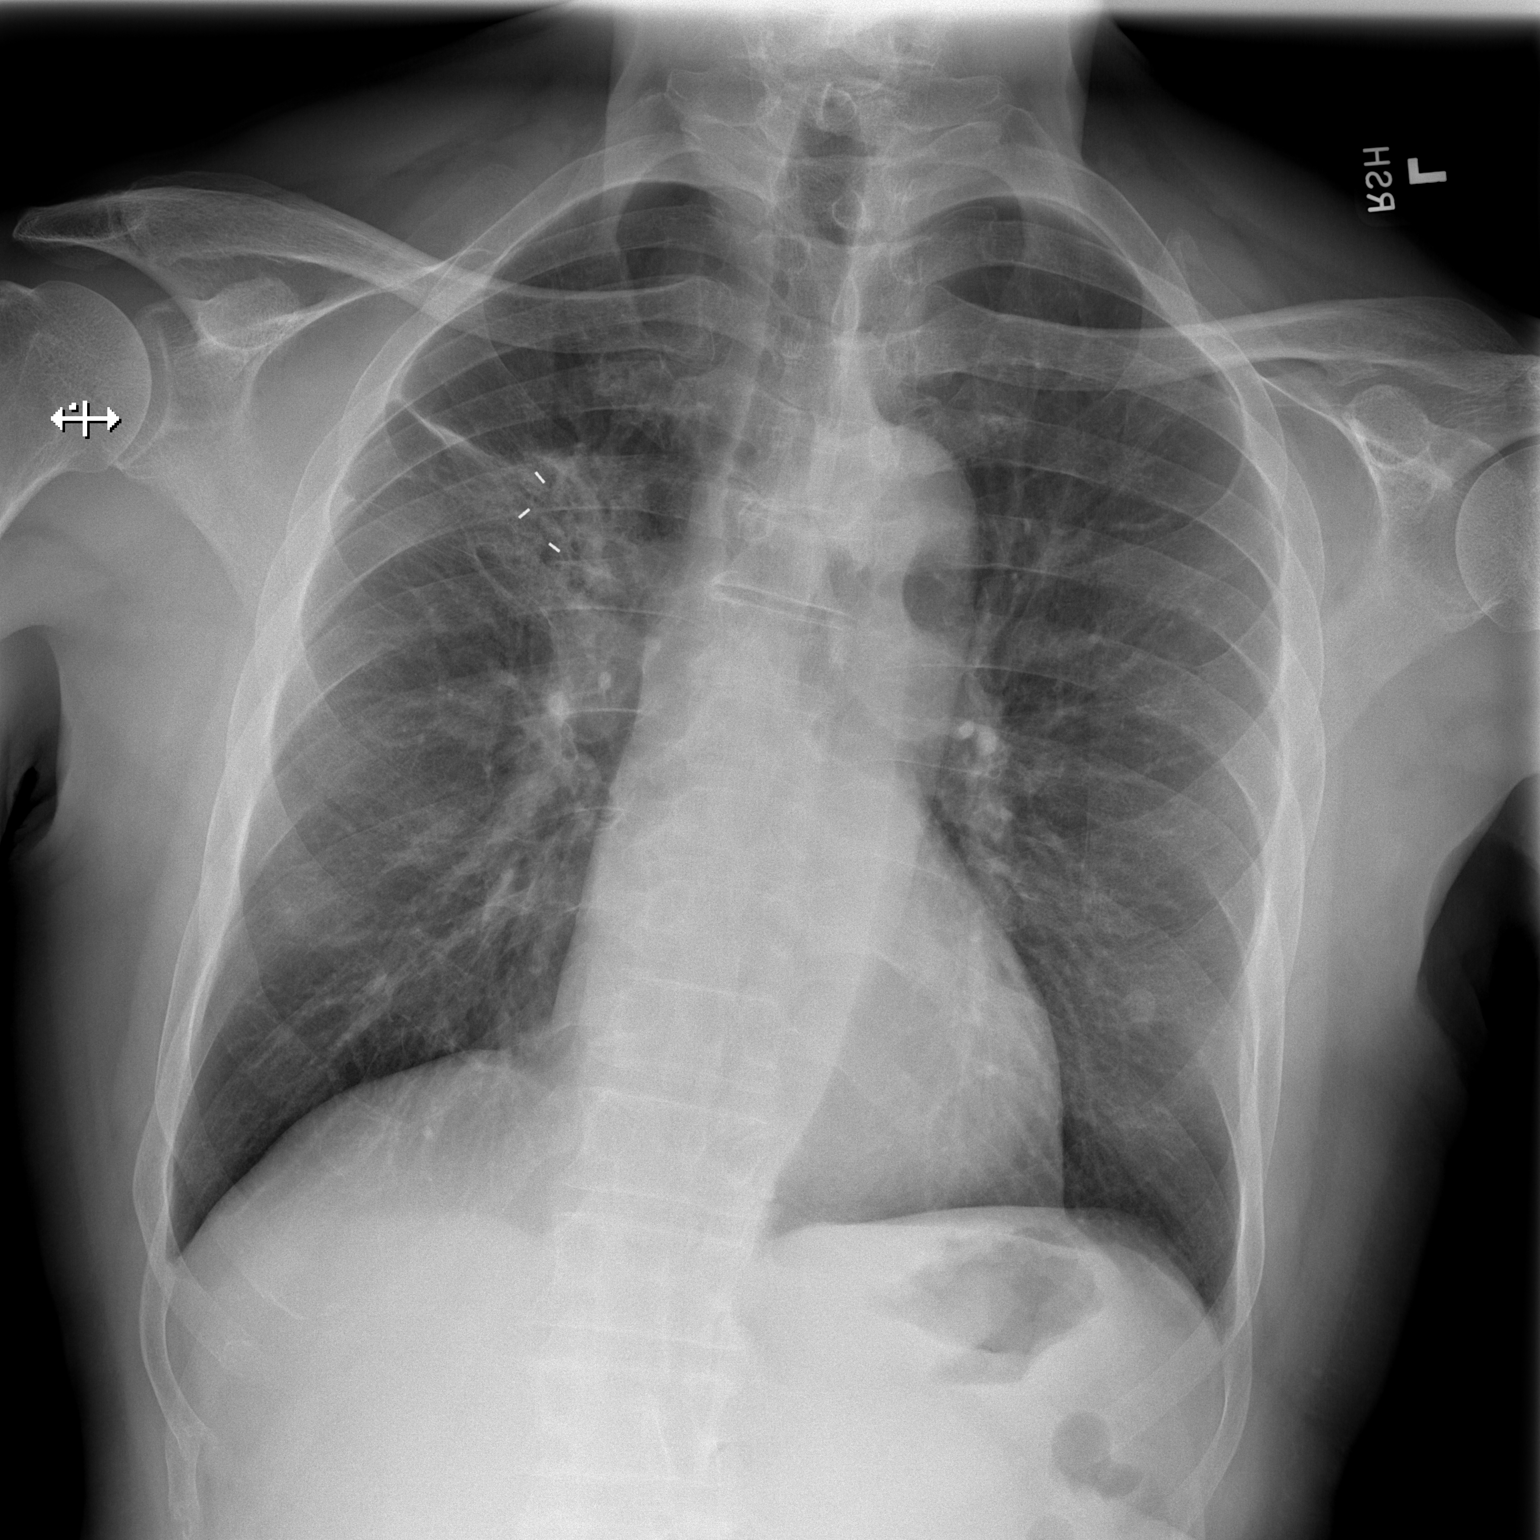

[w chest lat]
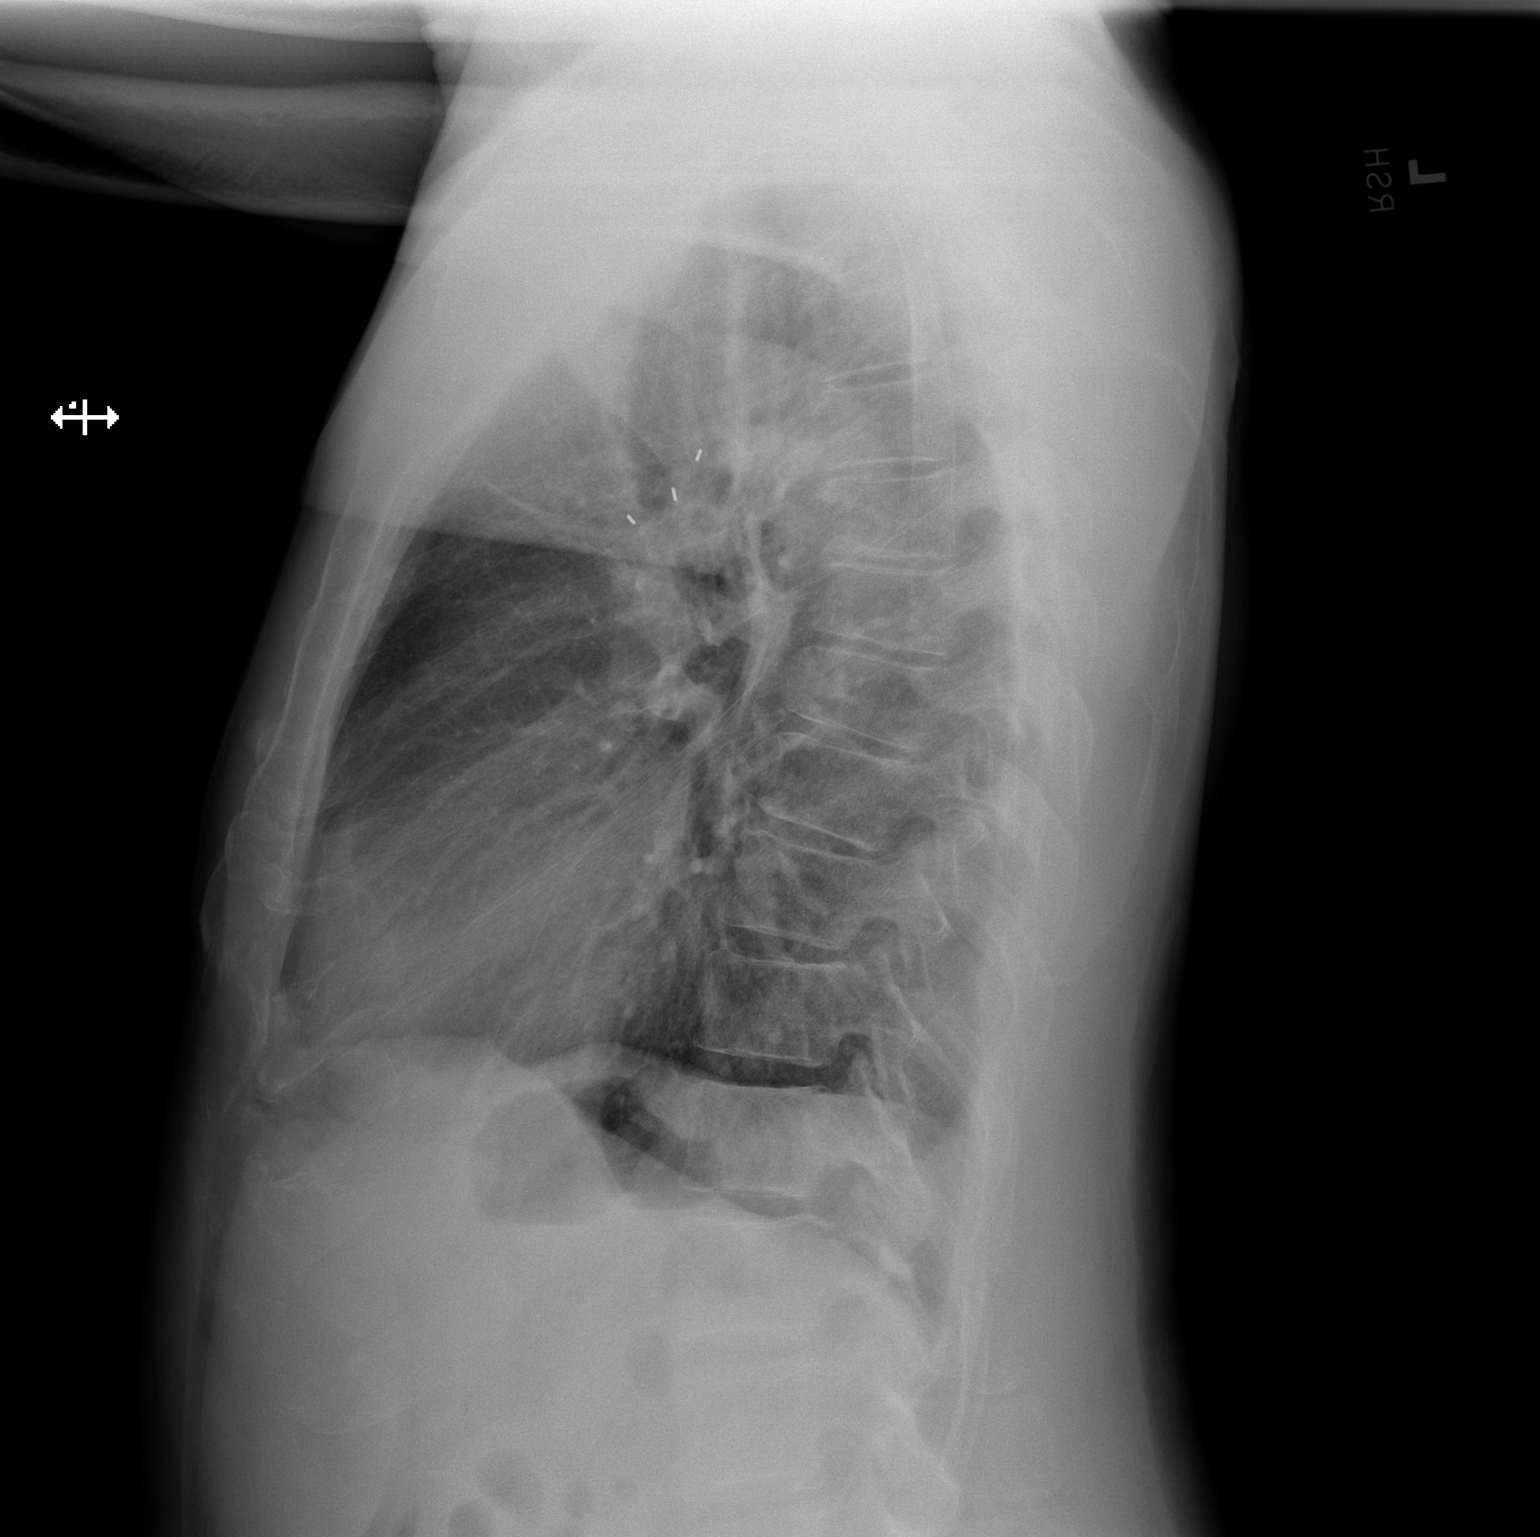

[2 of 2 positions shown; findings below may reference images not displayed]

FINDINGS: Stable cardiomediastinal silhouette with normal heart size. No
pneumothorax. No pleural effusion. Three clustered fiducial markers
in the upper right lung with surrounding patchy ill-defined opacity,
which appears mildly increased from prior chest CT. Clear left lung.
Nipple shadows overlie the lower lungs.
IMPRESSION: Patchy ill-defined upper right lung opacity surrounding clustered
fiducial markers, mildly increased from prior chest CT, nonspecific,
differential including post treatment change, recurrent tumor or
pneumonia. Chest CT suggested for further evaluation.

## 2020-10-11 MED ORDER — AZITHROMYCIN 250 MG PO TABS: 250.0000 mg | ORAL_TABLET | Freq: Every day | ORAL | 0 refills | Status: DC

## 2020-10-11 NOTE — Discharge Instructions (Addendum)
Return for any problem.  ?

## 2020-10-11 NOTE — ED Provider Notes (Signed)
[facility] EMERGENCY DEPARTMENT Provider Note   CSN: [id number] Arrival date & time: 10/11/20  1019     History Chief Complaint  Patient presents with   Nasal Congestion    Benjamin Willis. is a 75 y.o. male.  75 year old male with prior medical history as detailed below presents for evaluation.  Patient reports that on Tuesday he cut the grass.  He has been feeling a little congested since then.  He reports a mild cough with mild production of sputum.  He denies chest pain or fever.  He denies shortness of breath.  He denies other complaint.  He denies recent Covid exposure.     The history is provided by the patient and medical records.  Cough Cough characteristics:  Productive Sputum characteristics:  Nondescript Severity:  Mild Onset quality:  Gradual Duration:  2 days Timing:  Rare Progression:  Waxing and waning Chronicity:  New     Past Medical History:  Diagnosis Date   Allergy    Diabetes mellitus without complication ([place])    Type II   Hyperlipidemia    Hypertension     Patient Active Problem List   Diagnosis Date Noted   Atherosclerosis of aorta ([place]) 05/31/2020   Primary cancer of right upper lobe of lung ([place]) 02/07/2019   Lung nodule 02/02/2019   Hypertension associated with type 2 diabetes mellitus ([place]) 05/24/2018   Dyslipidemia 05/24/2018   Dyslipidemia associated with type 2 diabetes mellitus ([place]) 06/09/2017    Past Surgical History:  Procedure Laterality Date   ELECTROMAGNETIC NAVIGATION BROCHOSCOPY Right 02/02/2019   Procedure: VIDEO BRONCHOSCOPY WITH NAVIGATION AND FIDUCIAL PLACEMENT;  Surgeon: Garner Nash, DO;  Location: [place];  Service: Cardiopulmonary;  Laterality: Right;   FUDUCIAL PLACEMENT Right 02/02/2019   Procedure: Placement Of Fuducial Right Upper Lobe;  Surgeon: Garner Nash, DO;  Location: [place];  Service: Cardiopulmonary;  Laterality: Right;   none     VIDEO BRONCHOSCOPY WITH RADIAL  ENDOBRONCHIAL ULTRASOUND Right 02/02/2019   Procedure: Video Bronchoscopy With Radial Endobronchial Ultrasound;  Surgeon: Garner Nash, DO;  Location: [place];  Service: Cardiopulmonary;  Laterality: Right;       Family History  Problem Relation Age of Onset   Lung cancer Neg Hx     Social History   Tobacco Use   Smoking status: Former    Packs/day: 1.00    Years: 48.00    Pack years: 48.00    Types: Cigarettes    Quit date: 02/20/2015    Years since quitting: 5.6   Smokeless tobacco: Former    Quit date: 10/2013  Vaping Use   Vaping Use: Never used  Substance Use Topics   Alcohol use: No   Drug use: No    Home Medications Prior to Admission medications   Medication Sig Start Date End Date Taking? Authorizing Provider  albuterol (VENTOLIN HFA) 108 (90 Base) MCG/ACT inhaler Inhale 2 puffs into the lungs every 6 (six) hours as needed for wheezing or shortness of breath. 01/19/19   Icard, Leory Plowman L, DO  amLODipine (NORVASC) 10 MG tablet Take 1 tablet (10 mg total) by mouth daily. 12/06/19   Horald Pollen, MD  blood glucose meter kit and supplies Per insurance preference. Check blood glucose once a day. Dx E11.9 04/18/19   Horald Pollen, MD  Blood Pressure Monitoring (BLOOD PRESSURE MONITOR/ARM) DEVI Check BP once a day. DX I 10 04/18/19   Horald Pollen, MD  cetirizine Alethia Berthold)  10 MG tablet Take 1 tablet (10 mg total) by mouth daily. Patient not taking: Reported on 09/03/2020 09/03/17   Jaynee Eagles, PA-C  glipiZIDE (GLUCOTROL) 5 MG tablet TAKE 1 TABLET BY MOUTH TWICE DAILY WITH A MEAL 10/08/20   Horald Pollen, MD  latanoprost (XALATAN) 0.005 % ophthalmic solution  01/24/20   [provider]  lisinopril (ZESTRIL) 40 MG tablet Take 1 tablet (40 mg total) by mouth daily. 12/06/19 03/05/20  Horald Pollen, MD  metFORMIN (GLUCOPHAGE) 500 MG tablet Take 1 tablet (500 mg total) by mouth 2 (two) times daily with a meal. Take 1 tablet by mouth once  daily with breakfast 03/01/20 05/30/20  Horald Pollen, MD  Multiple Vitamins-Minerals (MULTIVITAMIN MEN) TABS Take 1 tablet by mouth daily.    [provider]  rosuvastatin (CRESTOR) 20 MG tablet Take 20 mg by mouth at bedtime. 10/27/19   [provider]  sitaGLIPtin (JANUVIA) 100 MG tablet Take 1 tablet (100 mg total) by mouth daily. 05/31/20 08/29/20  Horald Pollen, MD    Allergies    Patient has no known allergies.  Review of Systems   Review of Systems  Respiratory:  Positive for cough.   All other systems reviewed and are negative.  Physical Exam Updated Vital Signs BP 140/77 (BP Location: Left Arm)   Pulse 64   Temp 98.7 F (37.1 C) (Oral)   Resp 16   Ht _0  (1.676 m)   Wt 74.8 kg   SpO2 100%   BMI 26.63 kg/m   Physical Exam Vitals and nursing note reviewed.  Constitutional:      General: He is not in acute distress.    Appearance: Normal appearance. He is well-developed.  HENT:     Head: Normocephalic and atraumatic.  Eyes:     Conjunctiva/sclera: Conjunctivae normal.     Pupils: Pupils are equal, round, and reactive to light.  Cardiovascular:     Rate and Rhythm: Normal rate and regular rhythm.     Heart sounds: Normal heart sounds.  Pulmonary:     Effort: Pulmonary effort is normal. No respiratory distress.     Breath sounds: Normal breath sounds.  Abdominal:     General: There is no distension.     Palpations: Abdomen is soft.     Tenderness: There is no abdominal tenderness.  Musculoskeletal:        General: No deformity. Normal range of motion.     Cervical back: Normal range of motion and neck supple.  Skin:    General: Skin is warm and dry.  Neurological:     General: No focal deficit present.     Mental Status: He is alert and oriented to person, place, and time.    ED Results / Procedures / Treatments   Labs (all labs ordered are listed, but only abnormal results are displayed) Labs Reviewed - No data to  display  EKG None  Radiology DG Chest 2 View  Result Date: 10/11/2020 CLINICAL DATA:  Cough and dyspnea today. Non-small cell right lung cancer. EXAM: CHEST - 2 VIEW COMPARISON:  02/02/2019 chest radiograph.  01/25/2020 chest CT. FINDINGS: Stable cardiomediastinal silhouette with normal heart size. No pneumothorax. No pleural effusion. Three clustered fiducial markers in the upper right lung with surrounding patchy ill-defined opacity, which appears mildly increased from prior chest CT. Clear left lung. Nipple shadows overlie the lower lungs. IMPRESSION: Patchy ill-defined upper right lung opacity surrounding clustered fiducial markers, mildly increased from prior  chest CT, nonspecific, differential including post treatment change, recurrent tumor or pneumonia. Chest CT suggested for further evaluation. Electronically Signed   By: Ilona Sorrel M.D.   On: 10/11/2020 14:14    Procedures Procedures   Medications Ordered in ED Medications - No data to display  ED Course  I have reviewed the triage vital signs and the nursing notes.  Pertinent labs & imaging results that were available during my care of the patient were reviewed by me and considered in my medical decision making (see chart for details).    MDM Rules/Calculators/A&P                          MDM  MSE complete  Benjamin Willis. was evaluated in Emergency Department on 10/11/2020 for the symptoms described in the history of present illness. He was evaluated in the context of the global COVID-19 pandemic, which necessitated consideration that the patient might be at risk for infection with the SARS-CoV-2 virus that causes COVID-19. Institutional protocols and algorithms that pertain to the evaluation of patients at risk for COVID-19 are in a state of rapid change based on information released by regulatory bodies including the CDC and federal and state organizations. These policies and algorithms were followed during the patient's  care in the ED.   Patient is complaining of mild cough.  Symptoms are perhaps most consistent with a viral infection.  Patient's chest x-ray is without clear evidence of acute pneumonia.  Will prescribe a short course of Z-Pak to cover the possibility of early pneumonia.  Initially patient declined COVID swab.  He change his mind and we will now obtain this.  Patient understands how to follow-up his results later today.   Importance of close follow-up stressed.  Strict return precautions given understood.   Final Clinical Impression(s) / ED Diagnoses Final diagnoses:  Cough    Rx / DC Orders ED Discharge Orders     None        Valarie Merino, MD 10/11/20 1430

## 2020-10-11 NOTE — ED Triage Notes (Signed)
Patient here for evaluation of nasal congestion that started Tuesday after mowing his sister's yard. Patient alert, oriented, and in no apparent distress at this time.

## 2020-10-15 ENCOUNTER — Encounter (HOSPITAL_COMMUNITY): Payer: Self-pay | Admitting: *Deleted

## 2020-10-15 ENCOUNTER — Inpatient Hospital Stay (HOSPITAL_COMMUNITY)
Admission: EM | Admit: 2020-10-15 | Discharge: 2020-10-23 | DRG: 054 | Disposition: A | Discharge status: Home Health | Payer: Medicare Other | Attending: Family Medicine | Admitting: Family Medicine

## 2020-10-15 ENCOUNTER — Emergency Department (HOSPITAL_COMMUNITY): Payer: Medicare Other

## 2020-10-15 ENCOUNTER — Other Ambulatory Visit: Payer: Self-pay

## 2020-10-15 DIAGNOSIS — E119 Type 2 diabetes mellitus without complications: Secondary | ICD-10-CM

## 2020-10-15 DIAGNOSIS — G930 Cerebral cysts: Secondary | ICD-10-CM | POA: Diagnosis not present

## 2020-10-15 DIAGNOSIS — E785 Hyperlipidemia, unspecified: Secondary | ICD-10-CM | POA: Diagnosis present

## 2020-10-15 DIAGNOSIS — G936 Cerebral edema: Secondary | ICD-10-CM | POA: Diagnosis present

## 2020-10-15 DIAGNOSIS — Z79899 Other long term (current) drug therapy: Secondary | ICD-10-CM

## 2020-10-15 DIAGNOSIS — Z20822 Contact with and (suspected) exposure to covid-19: Secondary | ICD-10-CM | POA: Diagnosis present

## 2020-10-15 DIAGNOSIS — C7931 Secondary malignant neoplasm of brain: Principal | ICD-10-CM | POA: Diagnosis present

## 2020-10-15 DIAGNOSIS — R001 Bradycardia, unspecified: Secondary | ICD-10-CM | POA: Diagnosis not present

## 2020-10-15 DIAGNOSIS — E1169 Type 2 diabetes mellitus with other specified complication: Secondary | ICD-10-CM | POA: Diagnosis not present

## 2020-10-15 DIAGNOSIS — C3411 Malignant neoplasm of upper lobe, right bronchus or lung: Secondary | ICD-10-CM | POA: Diagnosis present

## 2020-10-15 DIAGNOSIS — I517 Cardiomegaly: Secondary | ICD-10-CM | POA: Diagnosis not present

## 2020-10-15 DIAGNOSIS — R7989 Other specified abnormal findings of blood chemistry: Secondary | ICD-10-CM | POA: Diagnosis present

## 2020-10-15 DIAGNOSIS — E1159 Type 2 diabetes mellitus with other circulatory complications: Secondary | ICD-10-CM | POA: Diagnosis present

## 2020-10-15 DIAGNOSIS — Z7984 Long term (current) use of oral hypoglycemic drugs: Secondary | ICD-10-CM

## 2020-10-15 DIAGNOSIS — T380X5A Adverse effect of glucocorticoids and synthetic analogues, initial encounter: Secondary | ICD-10-CM | POA: Diagnosis present

## 2020-10-15 DIAGNOSIS — R531 Weakness: Secondary | ICD-10-CM | POA: Diagnosis not present

## 2020-10-15 DIAGNOSIS — H409 Unspecified glaucoma: Secondary | ICD-10-CM | POA: Diagnosis present

## 2020-10-15 DIAGNOSIS — E1165 Type 2 diabetes mellitus with hyperglycemia: Secondary | ICD-10-CM | POA: Diagnosis present

## 2020-10-15 DIAGNOSIS — Z87891 Personal history of nicotine dependence: Secondary | ICD-10-CM

## 2020-10-15 DIAGNOSIS — Z7189 Other specified counseling: Secondary | ICD-10-CM

## 2020-10-15 DIAGNOSIS — R21 Rash and other nonspecific skin eruption: Secondary | ICD-10-CM | POA: Diagnosis not present

## 2020-10-15 DIAGNOSIS — Z515 Encounter for palliative care: Secondary | ICD-10-CM

## 2020-10-15 DIAGNOSIS — I10 Essential (primary) hypertension: Secondary | ICD-10-CM | POA: Diagnosis present

## 2020-10-15 DIAGNOSIS — R066 Hiccough: Secondary | ICD-10-CM | POA: Diagnosis present

## 2020-10-15 DIAGNOSIS — C3490 Malignant neoplasm of unspecified part of unspecified bronchus or lung: Secondary | ICD-10-CM | POA: Diagnosis present

## 2020-10-15 HISTORY — DX: Malignant neoplasm of unspecified part of unspecified bronchus or lung: C34.90

## 2020-10-15 LAB — COMPREHENSIVE METABOLIC PANEL
ALT: 17 U/L (ref 0–44)
AST: 18 U/L (ref 15–41)
Albumin: 4 g/dL (ref 3.5–5.0)
Alkaline Phosphatase: 79 U/L (ref 38–126)
Anion gap: 9 (ref 5–15)
BUN: 11 mg/dL (ref 8–23)
CO2: 25 mmol/L (ref 22–32)
Calcium: 9.2 mg/dL (ref 8.9–10.3)
Chloride: 106 mmol/L (ref 98–111)
Creatinine, Ser: 0.72 mg/dL (ref 0.61–1.24)
GFR, Estimated: >60 mL/min (ref >60)
Glucose, Bld: 118 mg/dL — ABNORMAL HIGH (ref 70–99)
Potassium: 4.1 mmol/L (ref 3.5–5.1)
Sodium: 140 mmol/L (ref 135–145)
Total Bilirubin: 0.4 mg/dL (ref 0.3–1.2)
Total Protein: 6.9 g/dL (ref 6.5–8.1)

## 2020-10-15 LAB — CBC WITH DIFFERENTIAL/PLATELET
Abs Immature Granulocytes: 0.01 10*3/uL (ref 0.00–0.07)
Basophils Absolute: 0 10*3/uL (ref 0.0–0.1)
Basophils Relative: 0 %
Eosinophils Absolute: 0.1 10*3/uL (ref 0.0–0.5)
Eosinophils Relative: 2 %
HCT: 41.1 % (ref 39.0–52.0)
Hemoglobin: 13 g/dL (ref 13.0–17.0)
Immature Granulocytes: 0 %
Lymphocytes Relative: 25 %
Lymphs Abs: 1.1 10*3/uL (ref 0.7–4.0)
MCH: 30.4 pg (ref 26.0–34.0)
MCHC: 31.6 g/dL (ref 30.0–36.0)
MCV: 96 fL (ref 80.0–100.0)
Monocytes Absolute: 0.4 10*3/uL (ref 0.1–1.0)
Monocytes Relative: 10 %
Neutro Abs: 2.9 10*3/uL (ref 1.7–7.7)
Neutrophils Relative %: 63 %
Platelets: 245 10*3/uL (ref 150–400)
RBC: 4.28 MIL/uL (ref 4.22–5.81)
RDW: 12.1 % (ref 11.5–15.5)
WBC: 4.6 10*3/uL (ref 4.0–10.5)
nRBC: 0 % (ref 0.0–0.2)

## 2020-10-15 LAB — TROPONIN I (HIGH SENSITIVITY)
Troponin I (High Sensitivity): 6 ng/L (ref <18)
Troponin I (High Sensitivity): 6 ng/L (ref <18)

## 2020-10-15 LAB — LIPASE, BLOOD: Lipase: 28 U/L (ref 11–51)

## 2020-10-15 LAB — MAGNESIUM: Magnesium: 2.2 mg/dL (ref 1.7–2.4)

## 2020-10-15 IMAGING — CT CT HEAD W/O CM
3 series · 15 of 47 positions shown, 18 images · non-contrast
Comparison: None.

CLINICAL DATA: Weakness

EXAM:
CT HEAD WITHOUT CONTRAST
TECHNIQUE: Contiguous axial images were obtained from the base of the skull
through the vertex without intravenous contrast.

[Series 3: head 5.0 h30s · axial · 0.48mm/px · z∈[-143,-3]mm · 9 of 34 slices shown, 12 images]
[im 3/34  brain]
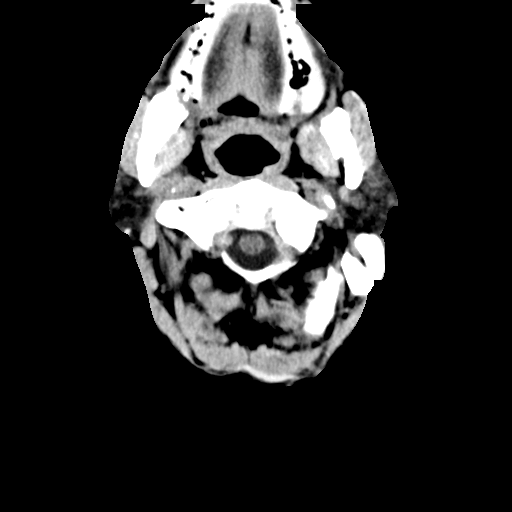
[im 3/34  bone]
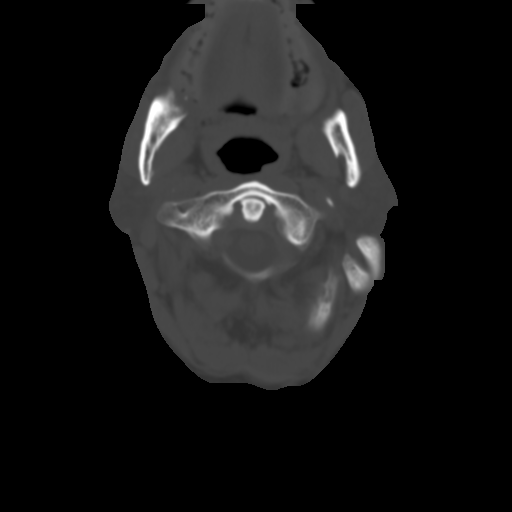
[im 6/34  brain]
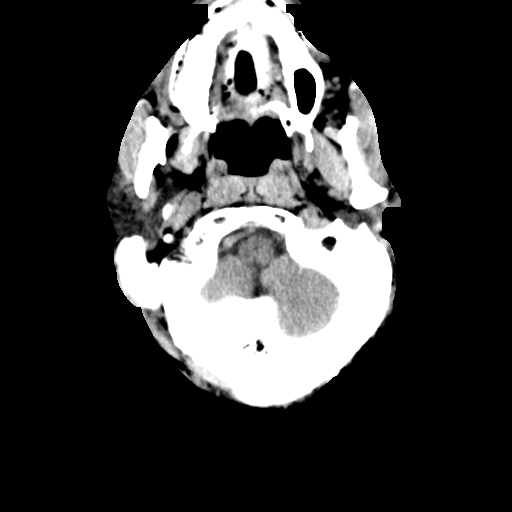
[im 10/34  brain]
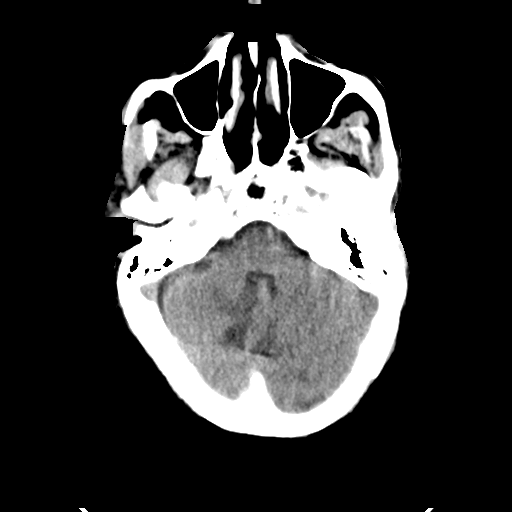
[im 13/34  brain]
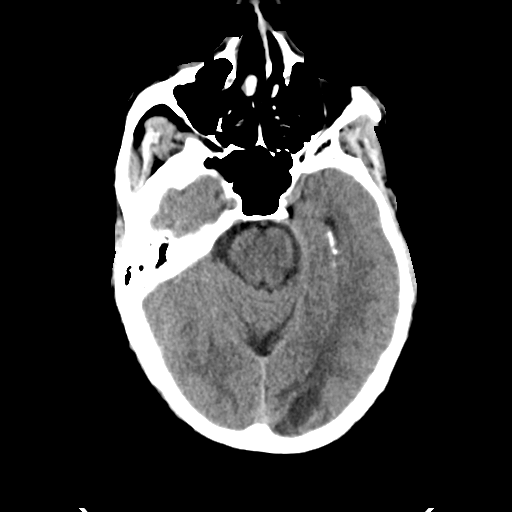
[im 18/34  brain]
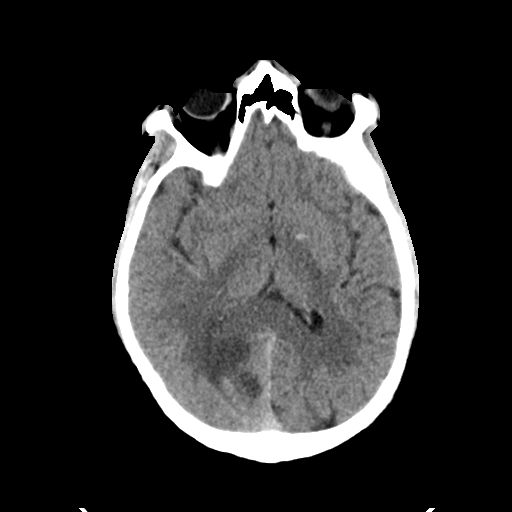
[im 18/34  bone]
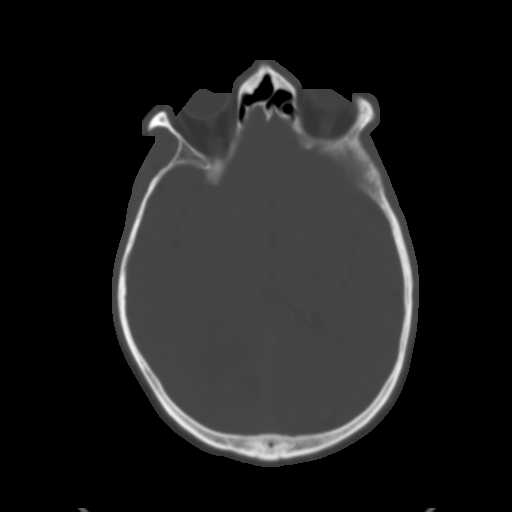
[im 21/34  brain]
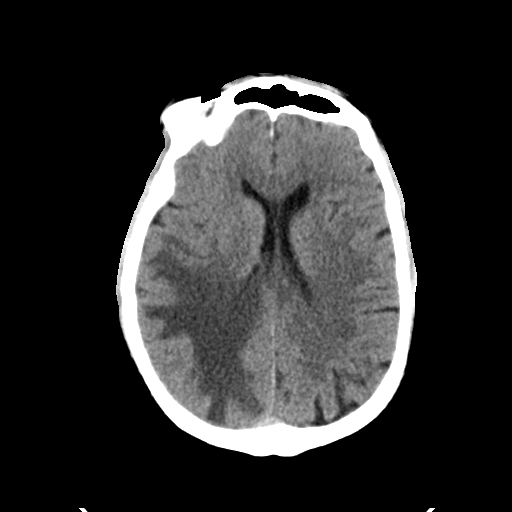
[im 24/34  brain]
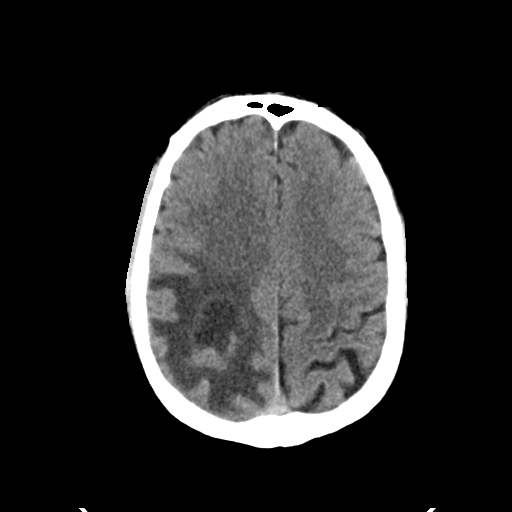
[im 28/34  brain]
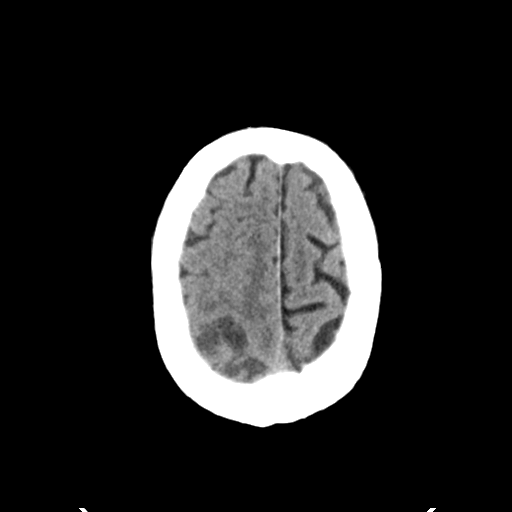
[im 31/34  brain]
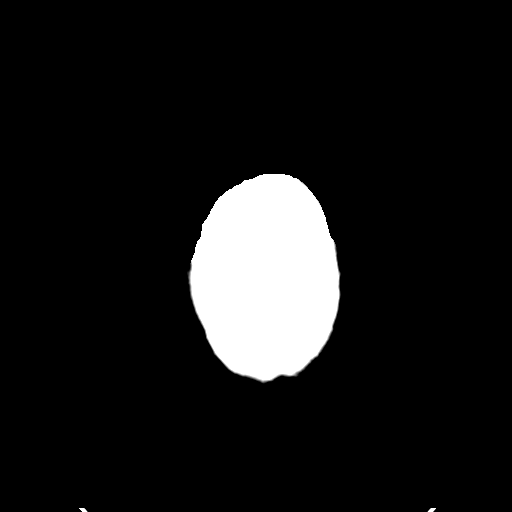
[im 31/34  bone]
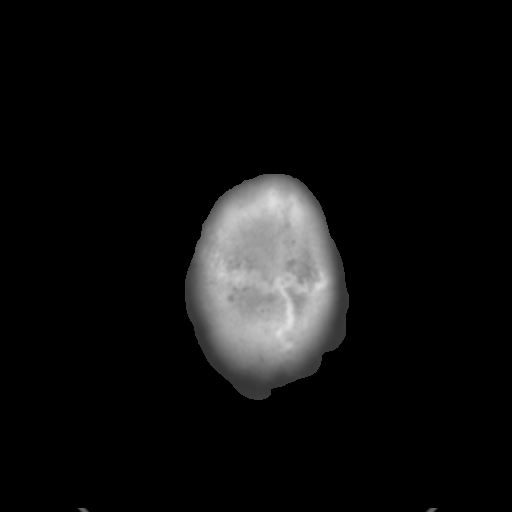

[Series 5: head 3.0 mpr cor · coronal · 0.35mm/px · 3 of 74 slices shown]
[im 25/74  brain]
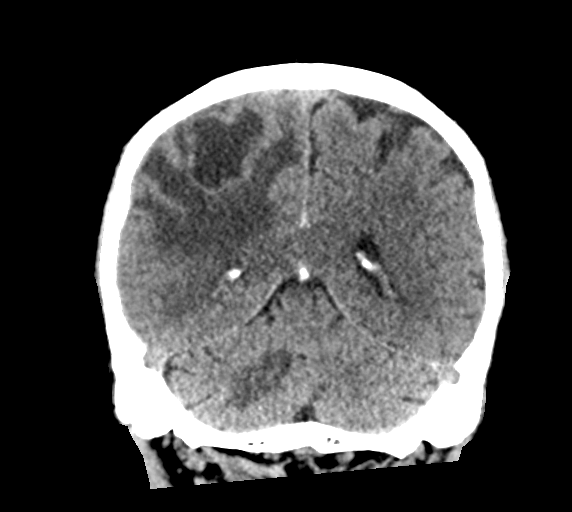
[im 33/74  brain]
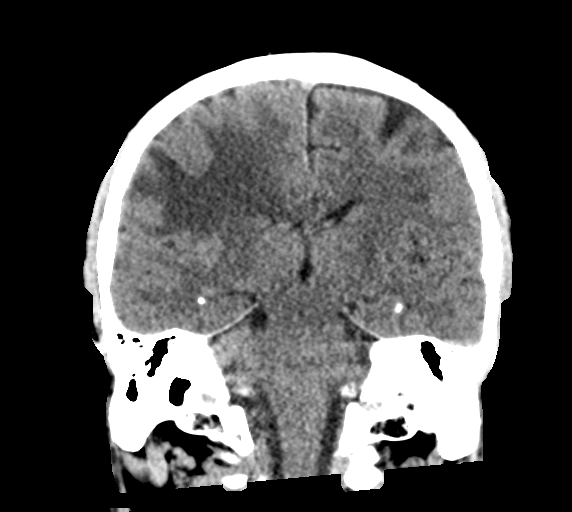
[im 41/74  brain]
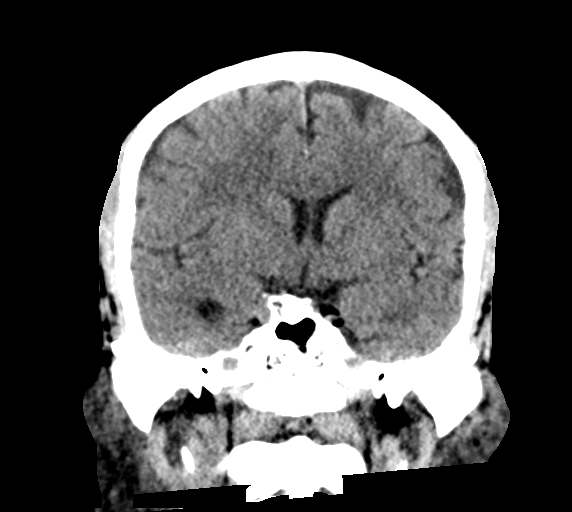

[Series 6: head 3.0 mpr sag · sagittal · 0.32mm/px · 3 of 65 slices shown]
[im 22/65  brain]
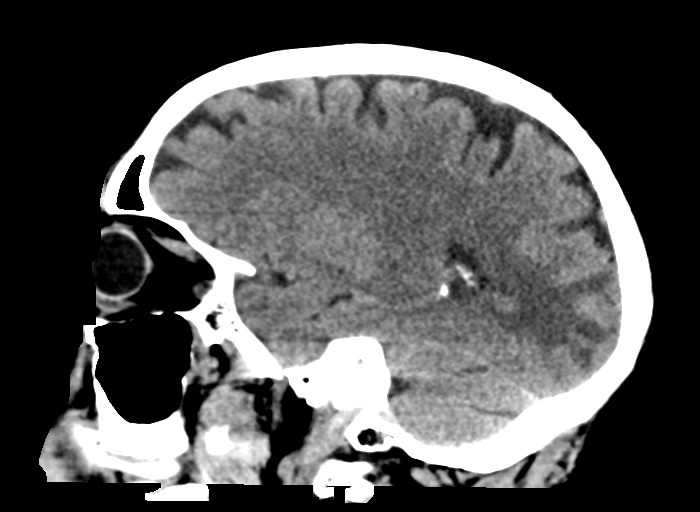
[im 33/65  brain]
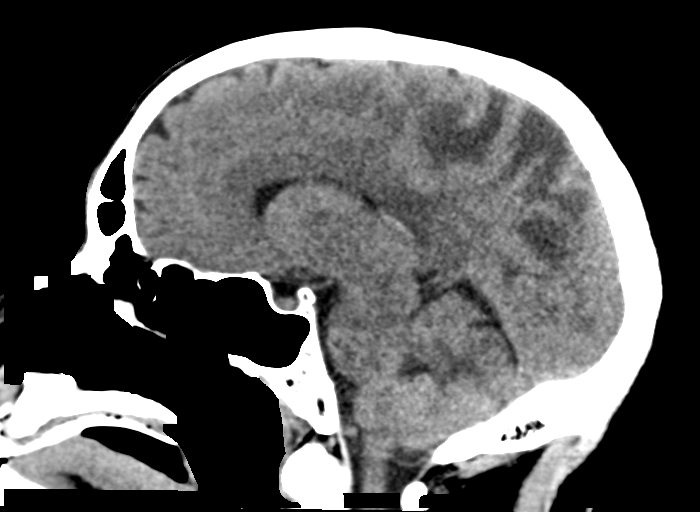
[im 43/65  brain]
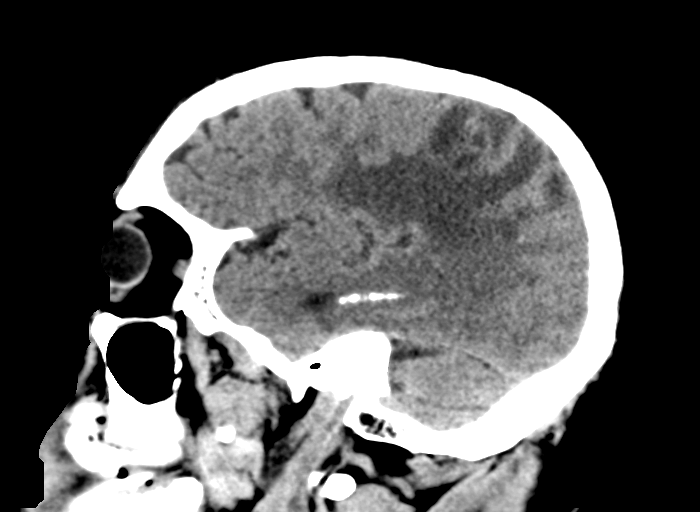

[15 of 47 positions shown; findings below may reference images not displayed]

FINDINGS: Brain: Cystic mass is seen in the right parietal lobe measuring
cm with surrounding extensive vasogenic edema. Probable cystic mass
in the left occipital lobe measuring 2 cm with surrounding edema.
Possible additional lesion in the right occipital lobe and right
cerebellar hemisphere. Findings suspicious for metastatic disease.
No hemorrhage. No midline shift or hydrocephalus.

Vascular: No hyperdense vessel or unexpected calcification.

Skull: No acute calvarial abnormality.

Sinuses/Orbits: No acute findings

Other: None
IMPRESSION: Multiple apparent cystic lesions within the brain with surrounding
vasogenic edema concerning for metastatic disease. Recommend further
evaluation with MRI.

These results were called by telephone at the time of interpretation
on [DATE] at [DATE] to provider POZISA , who
verbally acknowledged these results.

## 2020-10-15 IMAGING — CR DG CHEST 2V
2 series · 2 of 2 positions shown · non-contrast
Comparison: Chest radiograph dated [DATE].

CLINICAL DATA: 75-year-old male with weakness.

EXAM:
CHEST - 2 VIEW

[chest lat]
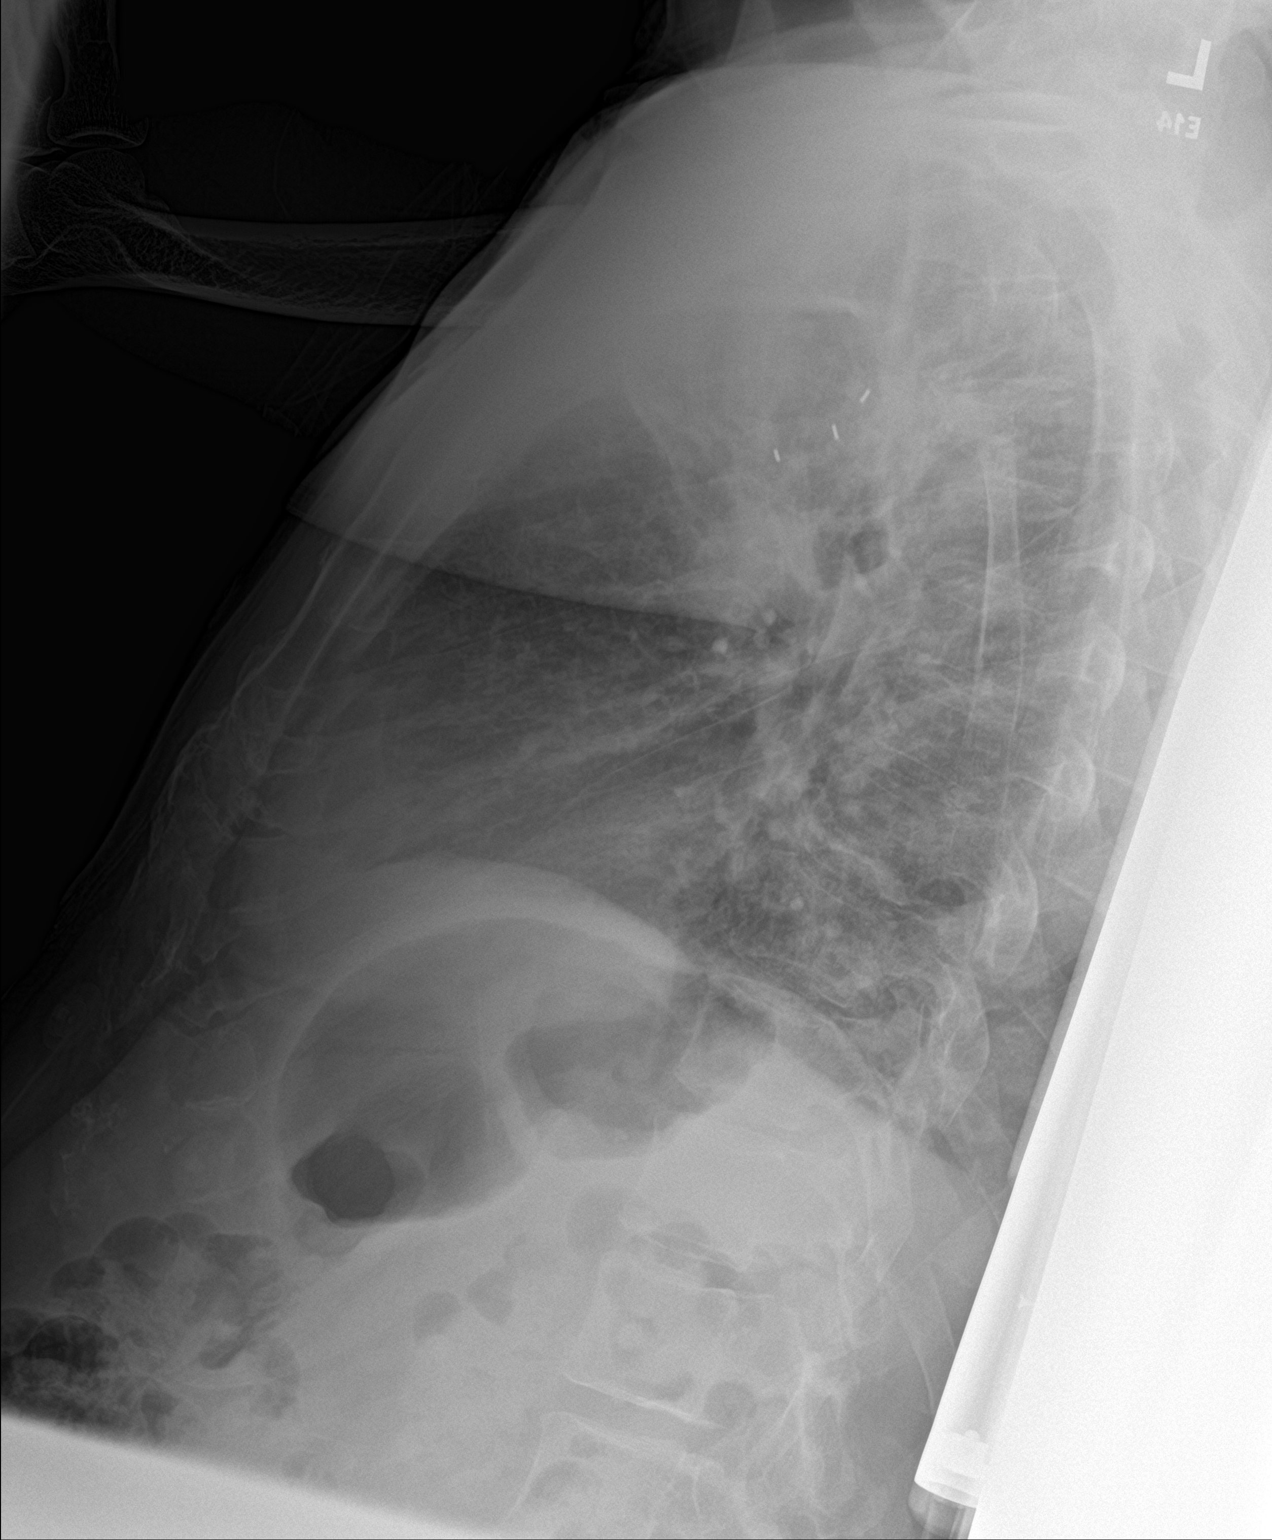

[chest ap]
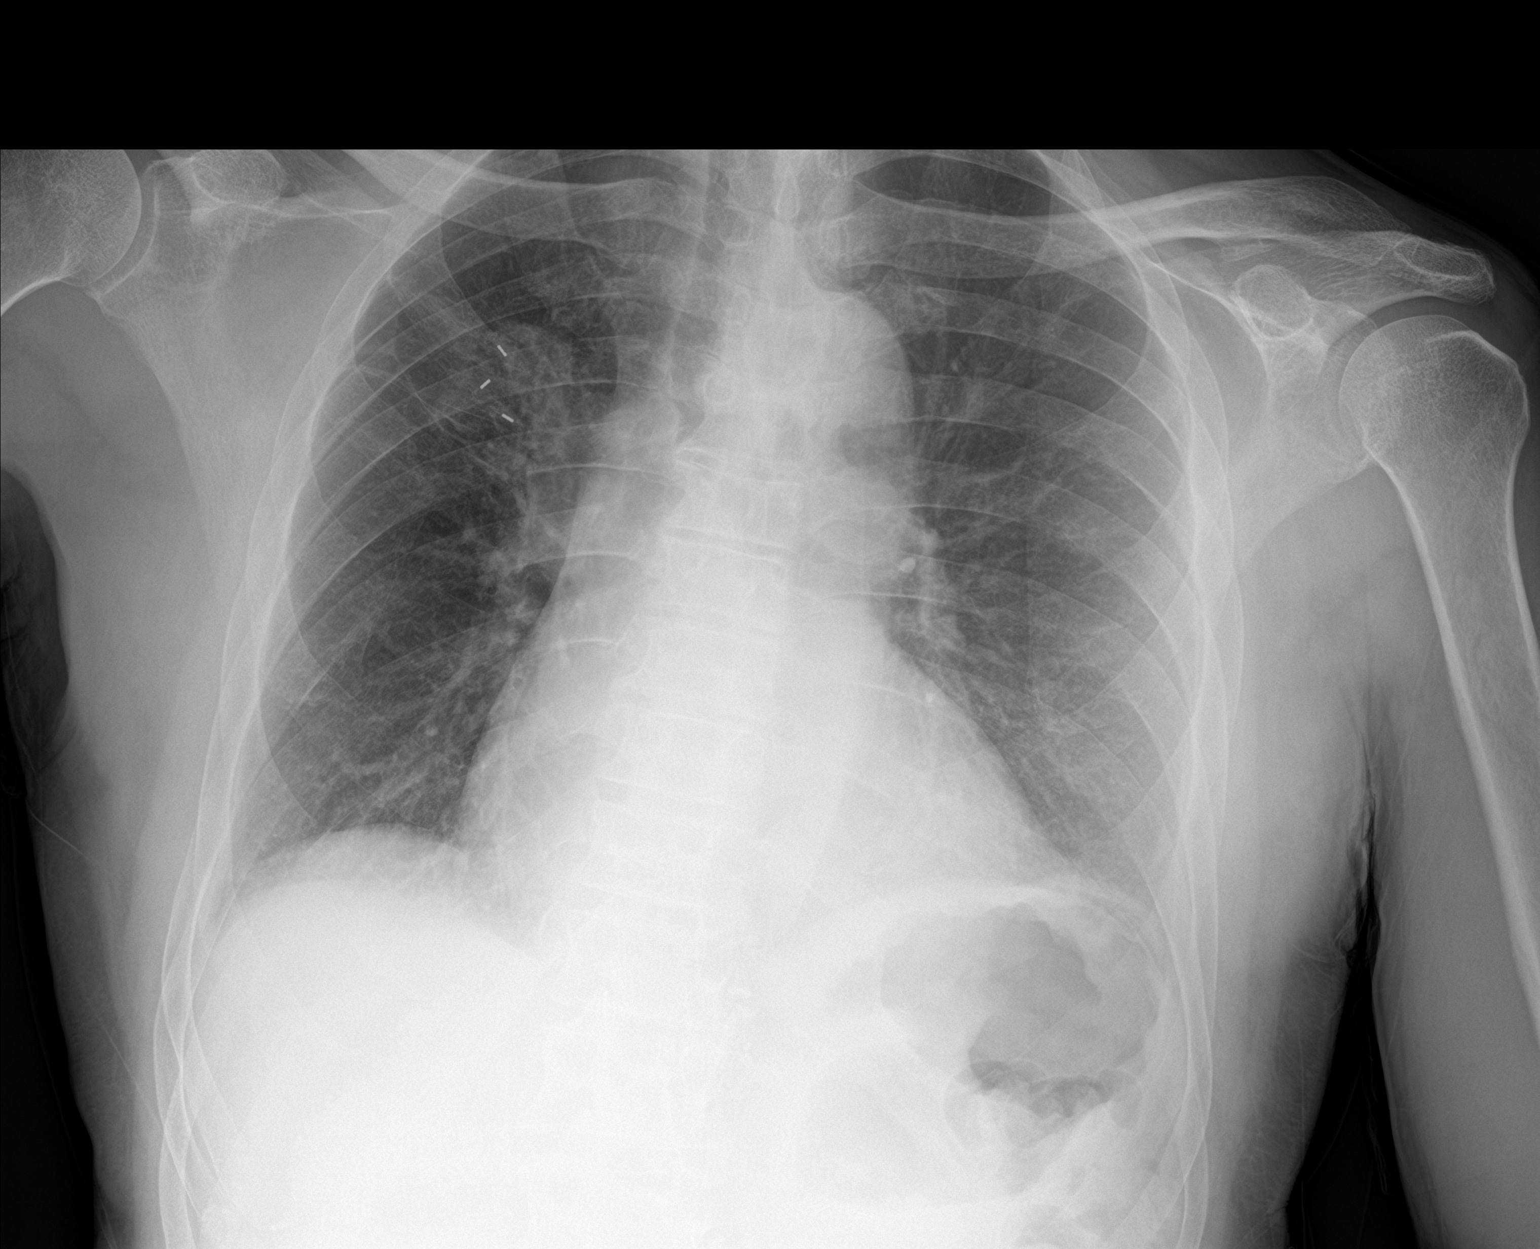

[2 of 2 positions shown; findings below may reference images not displayed]

FINDINGS: Shallow inspiration. Focal area of rounded opacity with several
associated fiducial markers as seen on the prior radiograph. No new
consolidation. There is no pleural effusion or pneumothorax. Mild
cardiomegaly. No acute osseous pathology.
IMPRESSION: 1. No acute cardiopulmonary process. No interval change in the
appearance of the rounded right upper lobe density.
2. Mild cardiomegaly.

## 2020-10-15 NOTE — ED Provider Notes (Addendum)
Emergency Medicine Provider Triage Evaluation Note  Benjamin Willis. , a 75 y.o. male  was evaluated in triage.  Pt complains of who presents with concern for generalized weakness with fall today.  Patient states that he has been becoming more and more weak just in the last 24 hours and is no longer feels he is able to care for himself at home or perform ADLs.  States that today he fell because he simply became too weak while he was walking.  Fortunately he did fall on the couch that he did his head.  He denies any blurry vision or double vision since that time and denies any unilateral weakness.  Additionally endorses right-sided abdominal pain that started after he was eating some associated nausea.  Review of Systems  Positive:  Global weakness, fall, right upper quadrant pain Negative: Chest pain, shortness of breath, palpitations  Physical Exam  BP (!) 173/77 (BP Location: Right Arm)   Pulse (!) 57   Temp 98.6 F (37 C) (Oral)   Resp 16   SpO2 98%  Gen:   Awake, no distress   Resp:  Normal effort  MSK:   Moves extremities without difficulty  Other:  Regular rate and rhythm no M/R/G, right upper quadrant tenderness palpation. No neuro deficits on exam.  Medical Decision Making  Medically screening exam initiated at 7:58 PM.  Appropriate orders placed.  Briana Florina Ou. was informed that the remainder of the evaluation will be completed by another provider, this initial triage assessment does not replace that evaluation, and the importance of remaining in the ED until their evaluation is complete.  This chart was dictated using voice recognition software, Dragon. Despite the best efforts of this provider to proofread and correct errors, errors may still occur which can change documentation meaning.    Emeline Darling, PA-C 10/15/20 [address], Sharlene Dory 10/15/20 2003    Arnaldo Natal, MD 10/15/20 2042

## 2020-10-15 NOTE — ED Triage Notes (Signed)
Pt reports being seen here yesterday for nasal congestions, had onset of generalized weakness since he was discharged. Pt reports difficulty ambulating since feeling weak, no unilateral weakness noted at triage.

## 2020-10-16 ENCOUNTER — Encounter (HOSPITAL_COMMUNITY): Payer: Self-pay | Admitting: Internal Medicine

## 2020-10-16 ENCOUNTER — Inpatient Hospital Stay (HOSPITAL_COMMUNITY): Payer: Medicare Other

## 2020-10-16 ENCOUNTER — Emergency Department (HOSPITAL_COMMUNITY): Payer: Medicare Other

## 2020-10-16 ENCOUNTER — Ambulatory Visit
Admit: 2020-10-16 | Discharge: 2020-10-16 | Disposition: A | Discharge status: Home / Self Care | Payer: Medicare Other | Attending: Radiation Oncology | Admitting: Radiation Oncology

## 2020-10-16 DIAGNOSIS — G9389 Other specified disorders of brain: Secondary | ICD-10-CM | POA: Diagnosis not present

## 2020-10-16 DIAGNOSIS — Z87891 Personal history of nicotine dependence: Secondary | ICD-10-CM | POA: Diagnosis not present

## 2020-10-16 DIAGNOSIS — E1165 Type 2 diabetes mellitus with hyperglycemia: Secondary | ICD-10-CM | POA: Diagnosis present

## 2020-10-16 DIAGNOSIS — Z79899 Other long term (current) drug therapy: Secondary | ICD-10-CM | POA: Diagnosis not present

## 2020-10-16 DIAGNOSIS — Z20822 Contact with and (suspected) exposure to covid-19: Secondary | ICD-10-CM | POA: Diagnosis present

## 2020-10-16 DIAGNOSIS — G936 Cerebral edema: Secondary | ICD-10-CM | POA: Diagnosis not present

## 2020-10-16 DIAGNOSIS — R531 Weakness: Secondary | ICD-10-CM | POA: Diagnosis not present

## 2020-10-16 DIAGNOSIS — E119 Type 2 diabetes mellitus without complications: Secondary | ICD-10-CM

## 2020-10-16 DIAGNOSIS — Z515 Encounter for palliative care: Secondary | ICD-10-CM | POA: Diagnosis not present

## 2020-10-16 DIAGNOSIS — T380X5A Adverse effect of glucocorticoids and synthetic analogues, initial encounter: Secondary | ICD-10-CM | POA: Diagnosis present

## 2020-10-16 DIAGNOSIS — C7931 Secondary malignant neoplasm of brain: Secondary | ICD-10-CM | POA: Diagnosis not present

## 2020-10-16 DIAGNOSIS — C3490 Malignant neoplasm of unspecified part of unspecified bronchus or lung: Secondary | ICD-10-CM | POA: Diagnosis present

## 2020-10-16 DIAGNOSIS — R569 Unspecified convulsions: Secondary | ICD-10-CM | POA: Diagnosis not present

## 2020-10-16 DIAGNOSIS — H409 Unspecified glaucoma: Secondary | ICD-10-CM

## 2020-10-16 DIAGNOSIS — I152 Hypertension secondary to endocrine disorders: Secondary | ICD-10-CM

## 2020-10-16 DIAGNOSIS — Z7984 Long term (current) use of oral hypoglycemic drugs: Secondary | ICD-10-CM | POA: Diagnosis not present

## 2020-10-16 DIAGNOSIS — K4020 Bilateral inguinal hernia, without obstruction or gangrene, not specified as recurrent: Secondary | ICD-10-CM | POA: Diagnosis not present

## 2020-10-16 DIAGNOSIS — E785 Hyperlipidemia, unspecified: Secondary | ICD-10-CM | POA: Diagnosis present

## 2020-10-16 DIAGNOSIS — E1169 Type 2 diabetes mellitus with other specified complication: Secondary | ICD-10-CM

## 2020-10-16 DIAGNOSIS — Z7189 Other specified counseling: Secondary | ICD-10-CM | POA: Diagnosis not present

## 2020-10-16 DIAGNOSIS — R066 Hiccough: Secondary | ICD-10-CM | POA: Diagnosis present

## 2020-10-16 DIAGNOSIS — D3500 Benign neoplasm of unspecified adrenal gland: Secondary | ICD-10-CM | POA: Diagnosis not present

## 2020-10-16 DIAGNOSIS — R7989 Other specified abnormal findings of blood chemistry: Secondary | ICD-10-CM | POA: Diagnosis present

## 2020-10-16 DIAGNOSIS — J701 Chronic and other pulmonary manifestations due to radiation: Secondary | ICD-10-CM | POA: Diagnosis not present

## 2020-10-16 DIAGNOSIS — I700 Atherosclerosis of aorta: Secondary | ICD-10-CM | POA: Diagnosis not present

## 2020-10-16 DIAGNOSIS — C3411 Malignant neoplasm of upper lobe, right bronchus or lung: Secondary | ICD-10-CM | POA: Diagnosis present

## 2020-10-16 DIAGNOSIS — E1159 Type 2 diabetes mellitus with other circulatory complications: Secondary | ICD-10-CM | POA: Diagnosis not present

## 2020-10-16 DIAGNOSIS — R29898 Other symptoms and signs involving the musculoskeletal system: Secondary | ICD-10-CM | POA: Diagnosis not present

## 2020-10-16 DIAGNOSIS — J941 Fibrothorax: Secondary | ICD-10-CM | POA: Diagnosis not present

## 2020-10-16 DIAGNOSIS — I10 Essential (primary) hypertension: Secondary | ICD-10-CM | POA: Diagnosis present

## 2020-10-16 HISTORY — DX: Unspecified glaucoma: H40.9

## 2020-10-16 LAB — CBG MONITORING, ED: Glucose-Capillary: 107 mg/dL — ABNORMAL HIGH (ref 70–99)

## 2020-10-16 LAB — GLUCOSE, CAPILLARY
Glucose-Capillary: 235 mg/dL — ABNORMAL HIGH (ref 70–99)
Glucose-Capillary: 148 mg/dL — ABNORMAL HIGH (ref 70–99)

## 2020-10-16 LAB — SARS CORONAVIRUS 2 (TAT 6-24 HRS): SARS Coronavirus 2: NEGATIVE

## 2020-10-16 IMAGING — CT CT CHEST-ABD-PELV W/ CM
3 of 5 series · 14 of 36 positions shown, 16 images · IV contrast (omnipaque)
Comparison: [DATE]

CLINICAL DATA: Brain metastases, staging, history of lung cancer

EXAM:
CT CHEST, ABDOMEN, AND PELVIS WITH CONTRAST
TECHNIQUE: Multidetector CT imaging of the chest, abdomen and pelvis was
performed following the standard protocol during bolus
administration of intravenous contrast.
CONTRAST:  100mL OMNIPAQUE IOHEXOL 300 MG/ML  SOLN

[Series 3: cap 5.0 i31f 2 · axial · 0.83mm/px · z∈[+966,+1472]mm · 9 of 127 slices shown, 11 images]
[im 13/127  mediastinal]
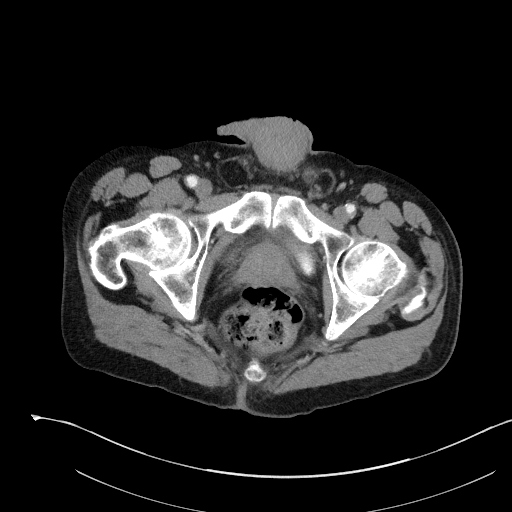
[im 13/127  bone]
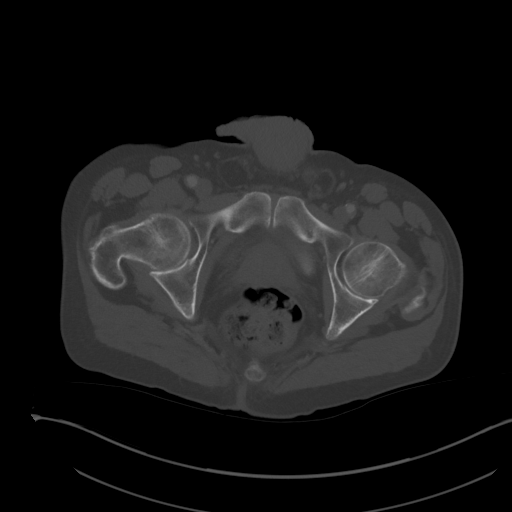
[im 26/127  mediastinal]
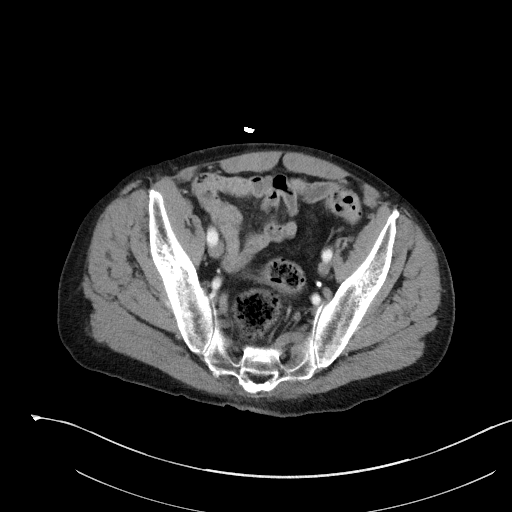
[im 38/127  mediastinal]
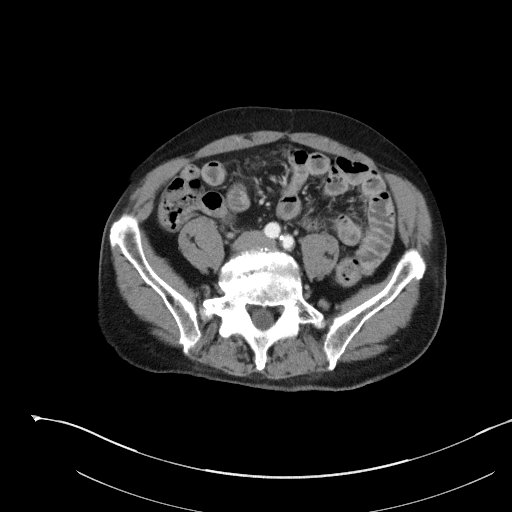
[im 51/127  mediastinal]
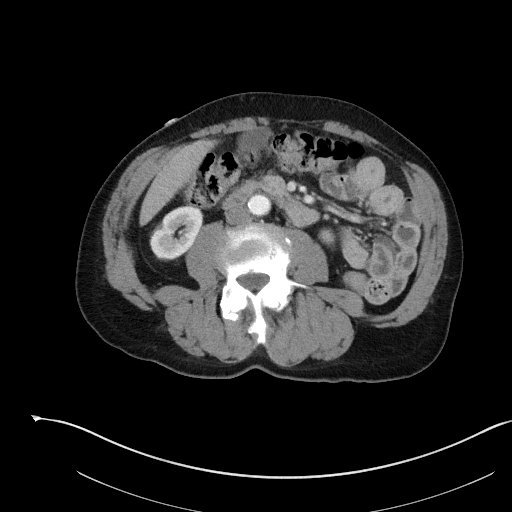
[im 64/127  mediastinal]
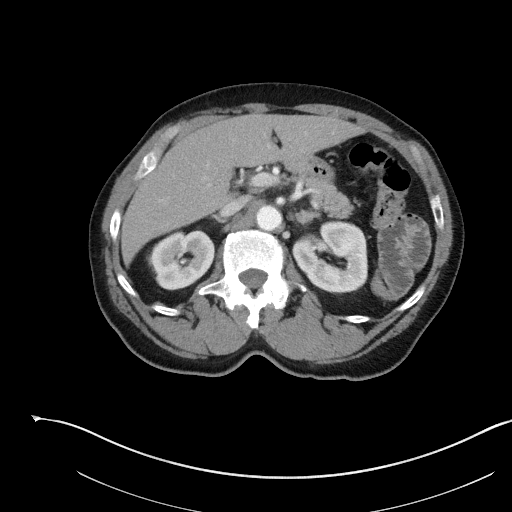
[im 76/127  mediastinal]
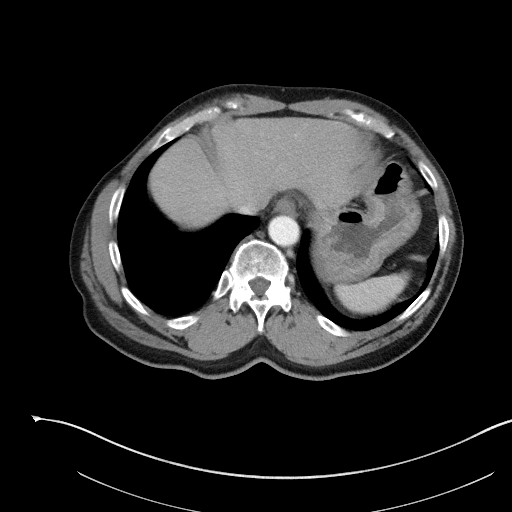
[im 89/127  mediastinal]
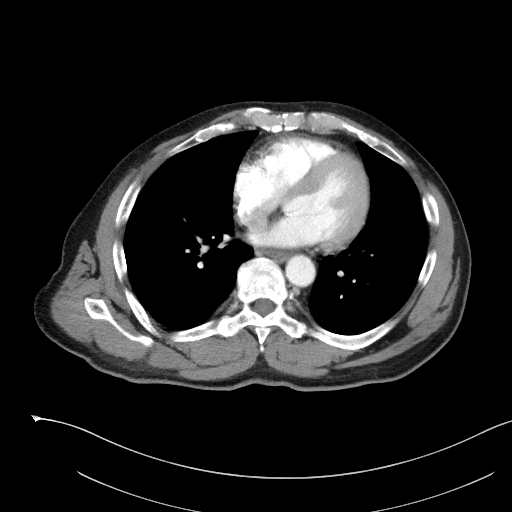
[im 101/127  mediastinal]
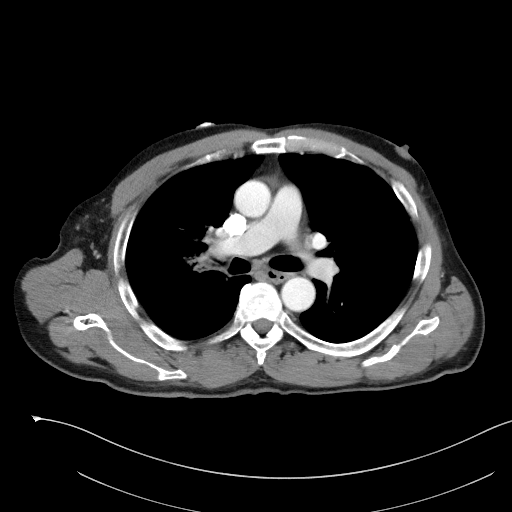
[im 114/127  mediastinal]
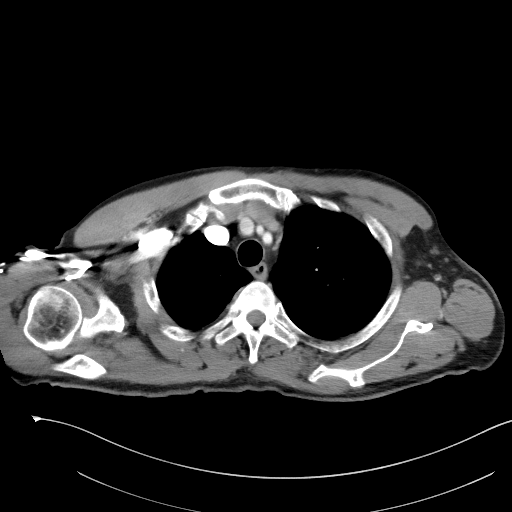
[im 114/127  bone]
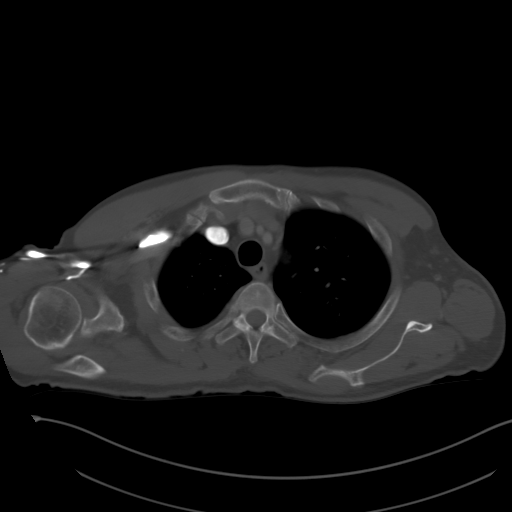

[Series 5: lungs · axial · 0.64mm/px · z∈[+1268,+1312]mm · 2 of 146 slices shown]
[im 12/146  mediastinal]
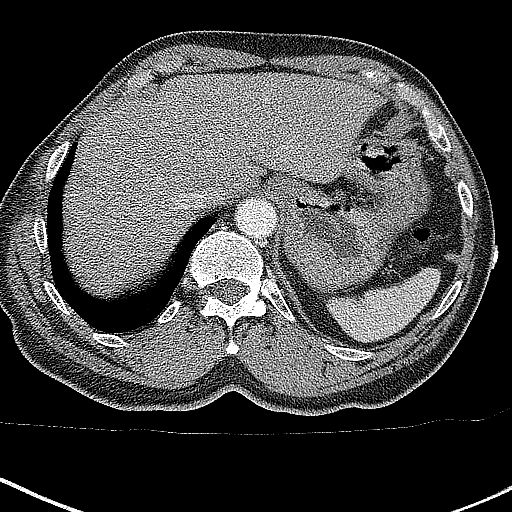
[im 34/146  mediastinal]
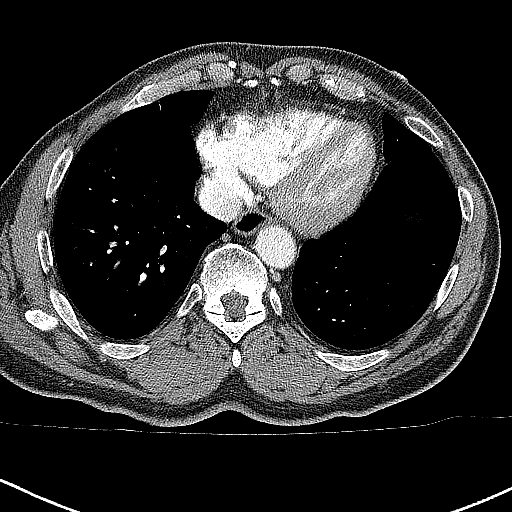

[Series 7: coronal · coronal · 0.73mm/px · 3 of 136 slices shown]
[im 28/136  mediastinal]
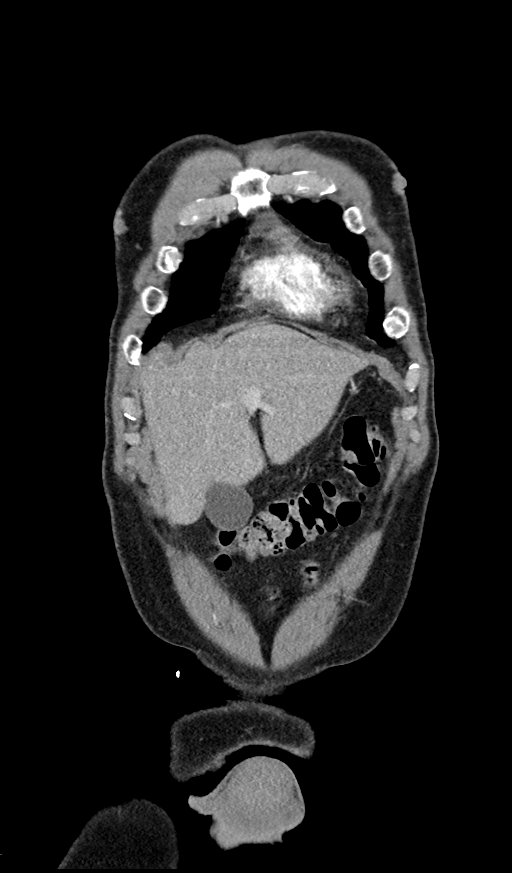
[im 55/136  mediastinal]
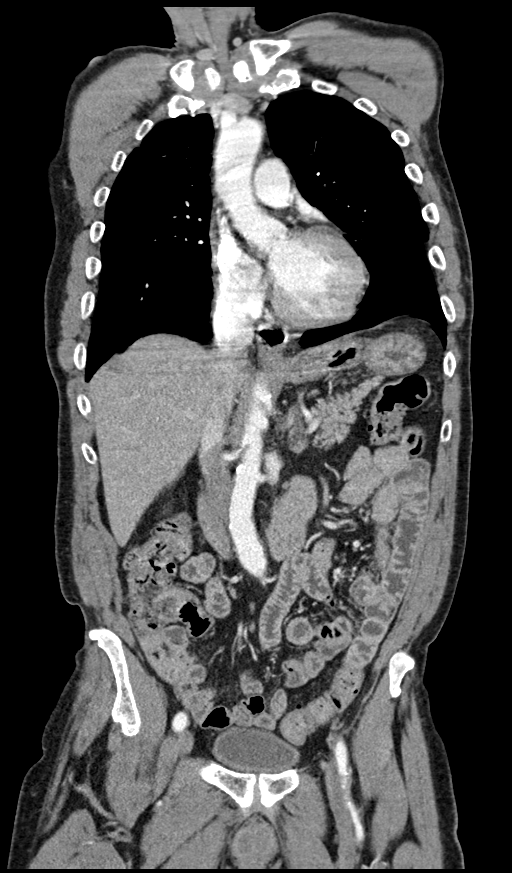
[im 82/136  mediastinal]
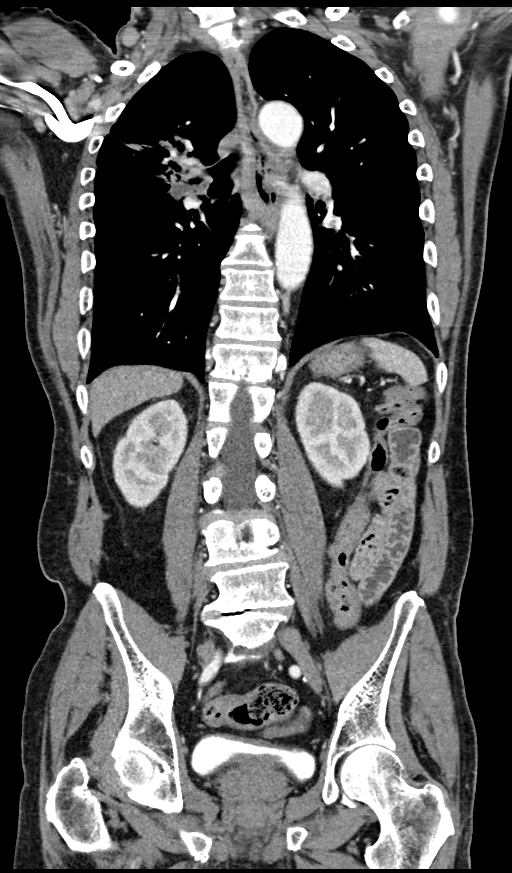

[14 of 36 positions shown; findings below may reference images not displayed]

FINDINGS: CT CHEST FINDINGS

Cardiovascular: Aortic atherosclerosis. Normal heart size. No
pericardial effusion.

Mediastinum/Nodes: No enlarged mediastinal, hilar, or axillary lymph
nodes. Thyroid gland, trachea, and esophagus demonstrate no
significant findings.

Lungs/Pleura: Expected interval evolution of radiation fibrosis in
the perihilar and suprahilar right lung. A nodular component in the
central right upper lobe is minimally decreased in size although
more difficult to appreciate against a background of developing
fibrosis, measuring approximately 1.4 x 1.3 cm, previously 1.5 x
cm (series 5, image 51). No pleural effusion or pneumothorax.

Musculoskeletal: No chest wall mass or suspicious bone lesions
identified.

CT ABDOMEN PELVIS FINDINGS

Hepatobiliary: No solid liver abnormality is seen. No gallstones,
gallbladder wall thickening, or biliary dilatation.

Pancreas: Unremarkable. No pancreatic ductal dilatation or
surrounding inflammatory changes.

Spleen: Normal in size without significant abnormality.

Adrenals/Urinary Tract: Stable small right adrenal adenoma,
measuring 1.3 x 0.9 cm (series 3, image 61). Kidneys are normal,
without renal calculi, solid lesion, or hydronephrosis. Bladder is
unremarkable.

Stomach/Bowel: Stomach is within normal limits. Appendix appears
normal. No evidence of bowel wall thickening, distention, or
inflammatory changes.

Vascular/Lymphatic: Aortic atherosclerosis. No enlarged abdominal or
pelvic lymph nodes.

Reproductive: No mass or other abnormality.

Other: Small, fat containing bilateral inguinal hernias. No
abdominopelvic ascites.

Musculoskeletal: No acute or significant osseous findings.
IMPRESSION: 1. Expected interval evolution of radiation fibrosis in the
perihilar and suprahilar right lung. A nodular component in the
central right upper lobe is minimally decreased in size although
more difficult to appreciate against a background of developing
fibrosis. Other previously described nodules are not well
appreciated. Findings are consistent with treatment response and
expected post treatment appearance.
2. No evidence of lymphadenopathy or metastatic disease in the
chest, abdomen or pelvis.

Aortic Atherosclerosis ([NN]-[NN]).

## 2020-10-16 IMAGING — MR MR HEAD WO/W CM
7 of 13 series · 26 of 48 positions shown · IV contrast (gadavist)
Comparison: Head CT [DATE]

CLINICAL DATA: Left greater than right-sided weakness. Multiple
brain lesions on CT. History of lung cancer.

EXAM:
MRI HEAD WITHOUT AND WITH CONTRAST
TECHNIQUE: Multiplanar, multiecho pulse sequences of the brain and surrounding
structures were obtained without and with intravenous contrast.
CONTRAST:  7.5mL GADAVIST GADOBUTROL 1 MMOL/ML IV SOLN

[Series 2: DWI · axial · 3.0mm · 0.94mm/px · z∈[-96,+51]mm · 7 of 98 slices shown (1 of 2)]
[im 1/98]
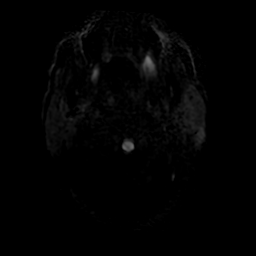
[im 17/98]
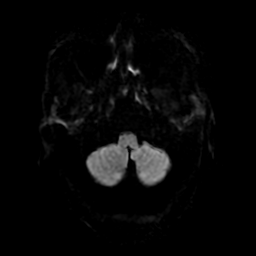
[im 33/98]
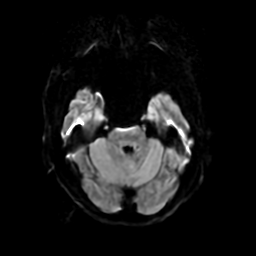
[im 49/98]
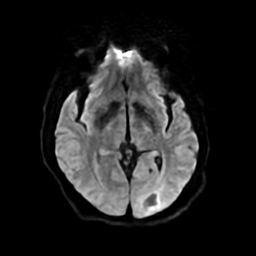
[im 65/98]
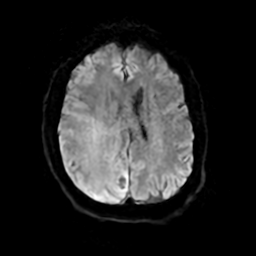
[im 81/98]
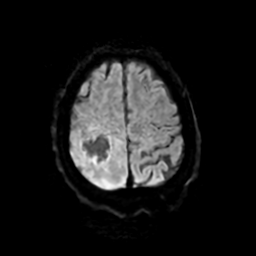
[im 98/98]
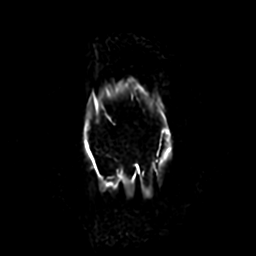

[Series 3: DWI · coronal · 4.0mm · 0.94mm/px · 5 of 70 slices shown (2 of 2)]
[im 1/70]
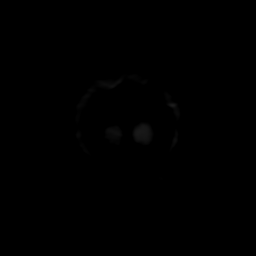
[im 18/70]
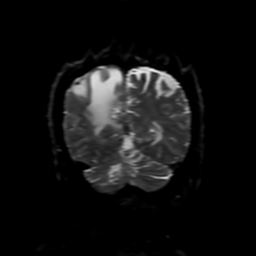
[im 35/70]
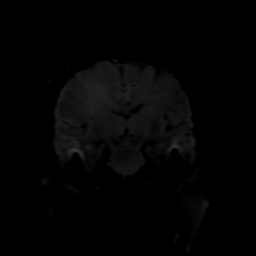
[im 52/70]
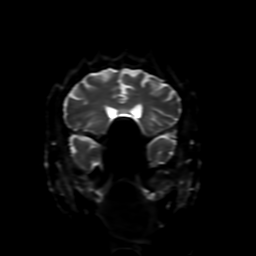
[im 70/70]
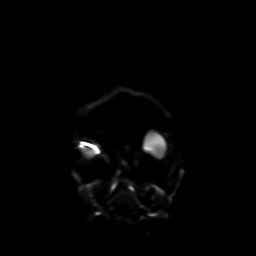

[Series 4: FLAIR · sagittal · 5.0mm · 0.23mm/px · 2 of 24 slices shown (1 of 2)]
[im 1/24]
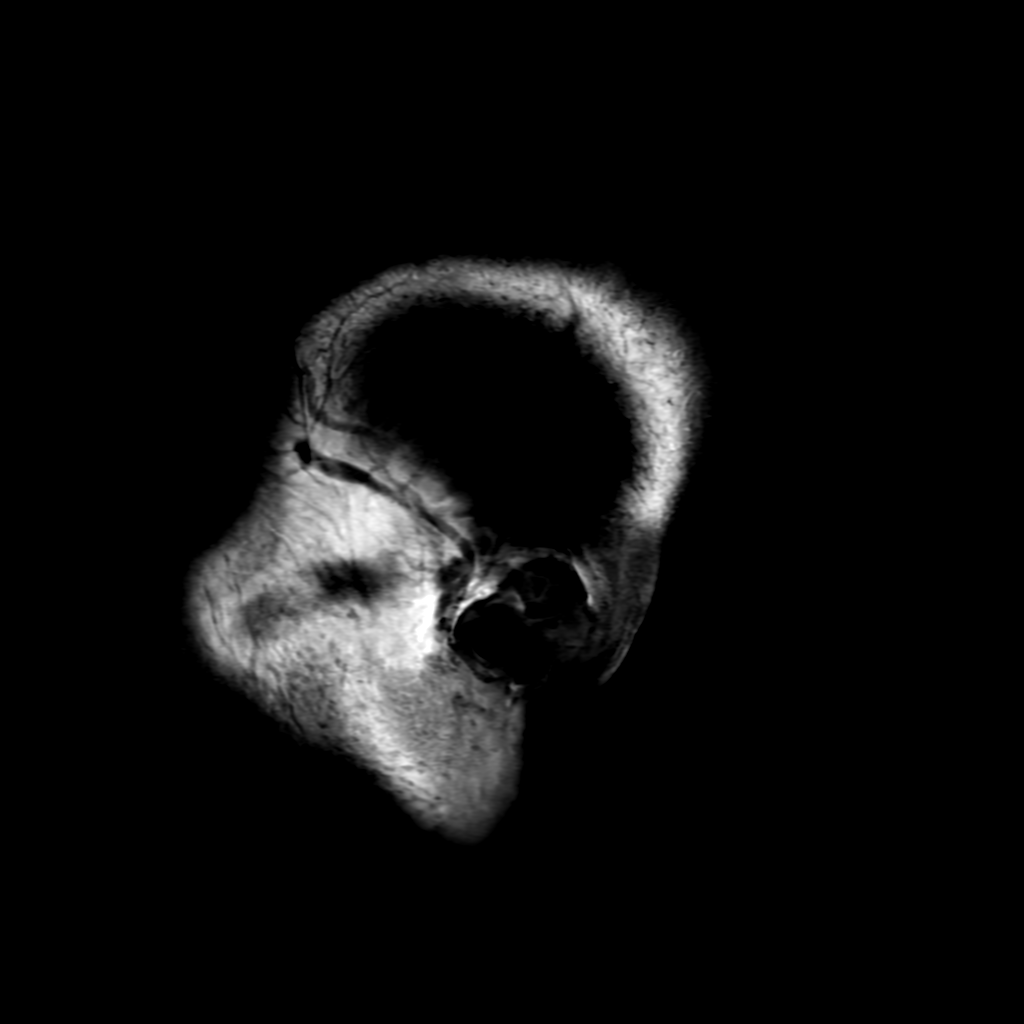
[im 24/24]
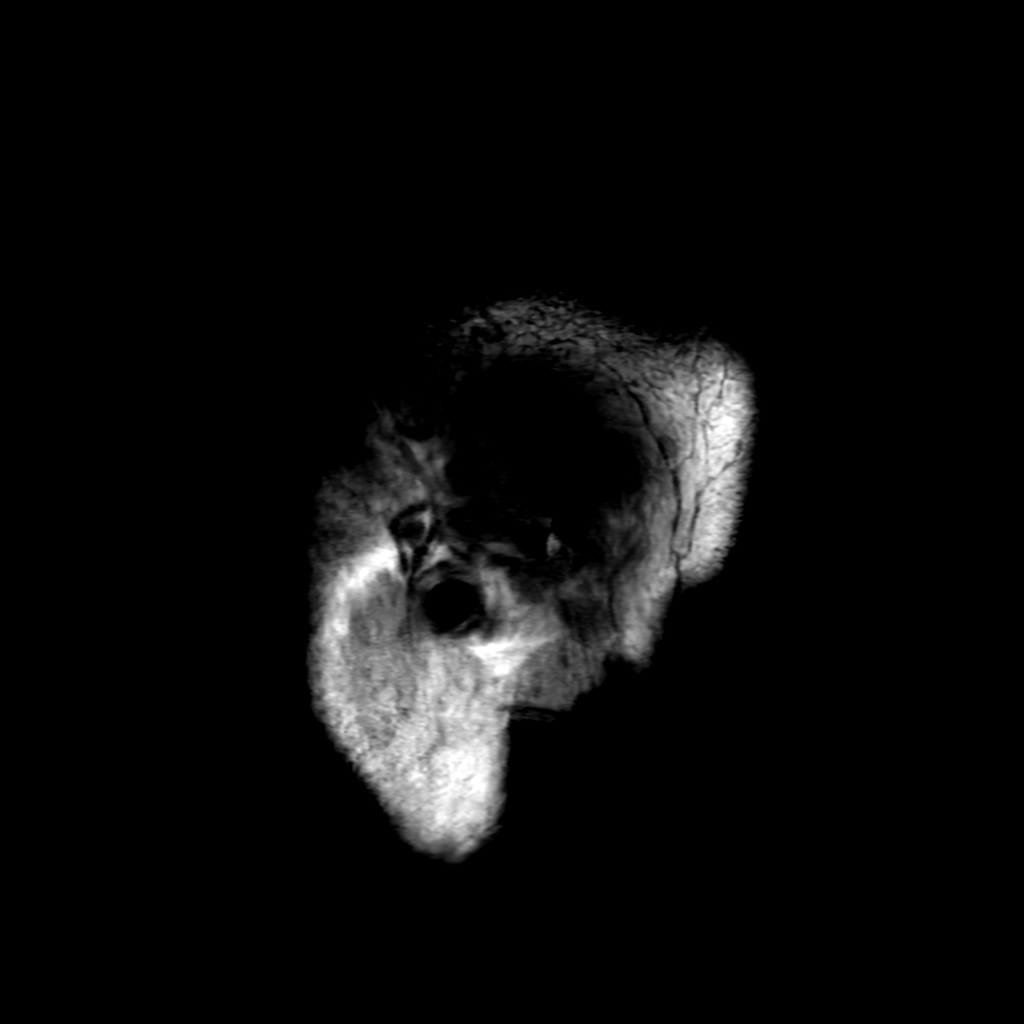

[Series 6: FLAIR · axial · 4.0mm · 0.45mm/px · z∈[-94,+50]mm · 3 of 34 slices shown (2 of 2)]
[im 1/34]
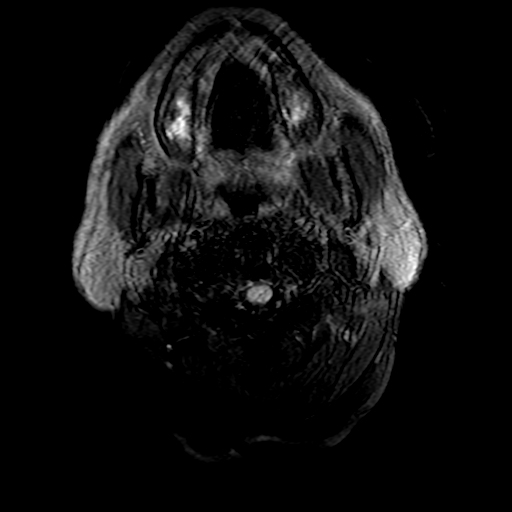
[im 17/34]
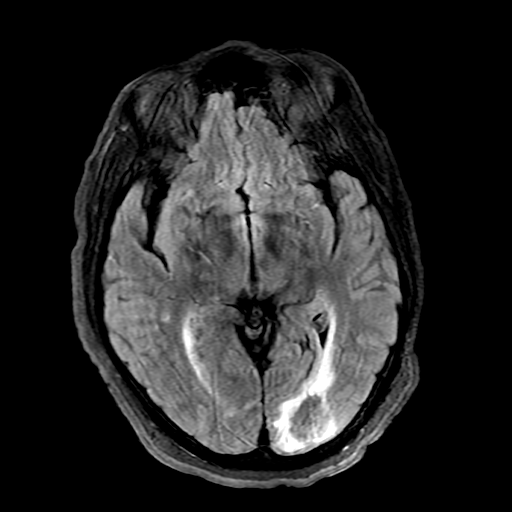
[im 34/34]
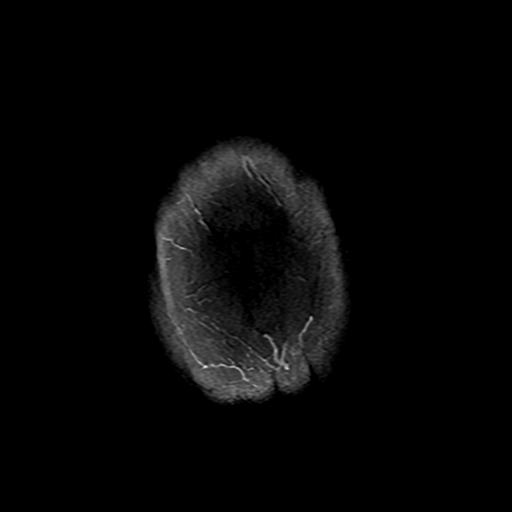

[Series 12: FLAIR post-contrast · sagittal · 5.0mm · 0.47mm/px · 2 of 24 slices shown]
[im 1/24]
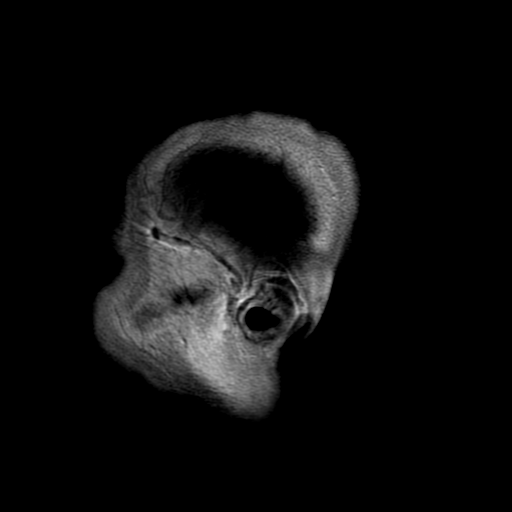
[im 24/24]
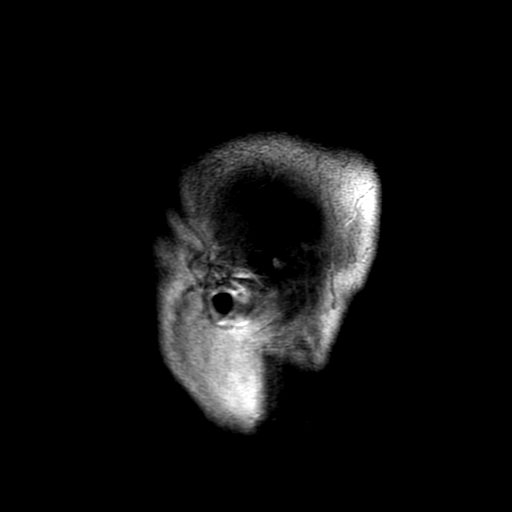

[Series 250: ADC · axial · 3.0mm · 0.94mm/px · z∈[-96,+51]mm · 4 of 50 slices shown (1 of 2)]
[im 1/50]
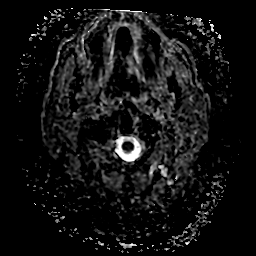
[im 17/50]
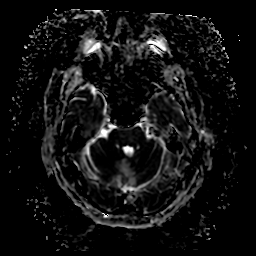
[im 33/50]
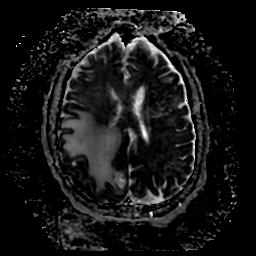
[im 50/50]
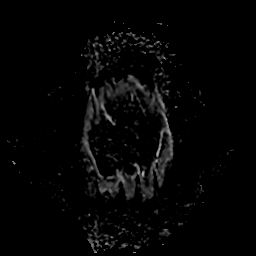

[Series 350: ADC · coronal · 4.0mm · 0.94mm/px · 3 of 35 slices shown (2 of 2)]
[im 1/35]
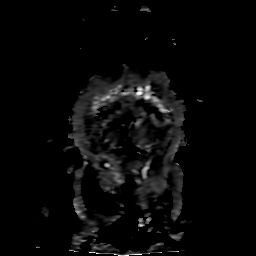
[im 18/35]
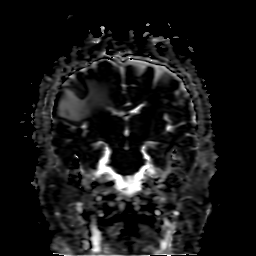
[im 35/35]
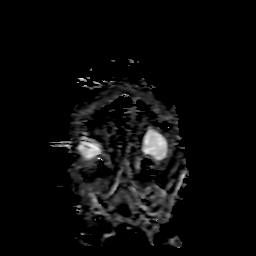

[26 of 48 positions shown; findings below may reference images not displayed]

FINDINGS: Brain: Partially cystic lesions with irregular peripheral
enhancement measured 2.2 x 1.7 cm in the posteromedial right
cerebellar hemisphere (series 10, image 16), 2.6 x 2.1 cm in the
left occipital lobe (series 10, image 27), 2.4 x 2.0 cm in the
medial right parietal lobe (series 10, image 32), and 3.3 x 3.1 cm
in the posterior right frontal lobe (series 10, image 40). There is
a small amount of chronic blood products associated with the
lesions. There is extensive vasogenic edema associated with the
posterior right frontal lesion and milder edema associated with the
other cerebral and cerebellar lesions. There is no midline shift.

Scattered small foci of T2 hyperintensity in the cerebral white
matter and pons are nonspecific but compatible with mild chronic
small vessel ischemic disease. No acute infarct or extra-axial fluid
collection is evident. Cerebral volume is within normal limits for
age.

Vascular: Major intracranial vascular flow voids are preserved.

Skull and upper cervical spine: Unremarkable bone marrow signal.

Sinuses/Orbits: Mild depression of the right lamina papyracea which
may reflect a remote orbital fracture. Paranasal sinuses and mastoid
air cells are clear.

Other: None.
IMPRESSION: 1. Four brain lesions consistent with metastases. Extensive
vasogenic edema associated with the posterior right frontal lesion
without midline shift.
2. Mild chronic small vessel ischemic disease.

## 2020-10-16 MED ORDER — DOCUSATE SODIUM 100 MG PO CAPS
100.0000 mg | ORAL_CAPSULE | Freq: Two times a day (BID) | ORAL | Status: DC
  Administered 2020-10-16 – 2020-10-23 (×15): 100 mg via ORAL
  Filled 2020-10-16 (×15): qty 1

## 2020-10-16 MED ORDER — HYDROCODONE-ACETAMINOPHEN 5-325 MG PO TABS: 1.0000 | ORAL_TABLET | ORAL | Status: DC | PRN

## 2020-10-16 MED ORDER — MORPHINE SULFATE (PF) 2 MG/ML IV SOLN: 2.0000 mg | INTRAVENOUS | Status: DC | PRN

## 2020-10-16 MED ORDER — ONDANSETRON HCL 4 MG PO TABS: 4.0000 mg | ORAL_TABLET | Freq: Four times a day (QID) | ORAL | Status: DC | PRN

## 2020-10-16 MED ORDER — GADOBUTROL 1 MMOL/ML IV SOLN
7.5000 mL | Freq: Once | INTRAVENOUS | Status: AC | PRN
  Administered 2020-10-16: 7.5 mL via INTRAVENOUS

## 2020-10-16 MED ORDER — INSULIN ASPART 100 UNIT/ML IJ SOLN
0.0000 [IU] | Freq: Three times a day (TID) | INTRAMUSCULAR | Status: DC
  Administered 2020-10-16 – 2020-10-17 (×2): 5 [IU] via SUBCUTANEOUS
  Administered 2020-10-17: 3 [IU] via SUBCUTANEOUS
  Administered 2020-10-18 (×2): 5 [IU] via SUBCUTANEOUS
  Administered 2020-10-18: 11 [IU] via SUBCUTANEOUS
  Administered 2020-10-19 (×2): 5 [IU] via SUBCUTANEOUS
  Administered 2020-10-19: 8 [IU] via SUBCUTANEOUS
  Administered 2020-10-20: 5 [IU] via SUBCUTANEOUS
  Administered 2020-10-20: 3 [IU] via SUBCUTANEOUS
  Administered 2020-10-20: 5 [IU] via SUBCUTANEOUS
  Administered 2020-10-21 (×2): 3 [IU] via SUBCUTANEOUS
  Administered 2020-10-21: 11 [IU] via SUBCUTANEOUS
  Administered 2020-10-22 (×2): 5 [IU] via SUBCUTANEOUS
  Administered 2020-10-22: 3 [IU] via SUBCUTANEOUS
  Administered 2020-10-23: 5 [IU] via SUBCUTANEOUS
  Administered 2020-10-23: 8 [IU] via SUBCUTANEOUS
  Filled 2020-10-16: qty 0.15

## 2020-10-16 MED ORDER — ALBUTEROL SULFATE (2.5 MG/3ML) 0.083% IN NEBU: 2.5000 mg | INHALATION_SOLUTION | Freq: Four times a day (QID) | RESPIRATORY_TRACT | Status: DC | PRN

## 2020-10-16 MED ORDER — ALLOPURINOL 100 MG PO TABS
100.0000 mg | ORAL_TABLET | Freq: Every day | ORAL | Status: DC
  Administered 2020-10-16 – 2020-10-23 (×8): 100 mg via ORAL
  Filled 2020-10-16 (×8): qty 1

## 2020-10-16 MED ORDER — HYDRALAZINE HCL 20 MG/ML IJ SOLN: 5.0000 mg | INTRAMUSCULAR | Status: DC | PRN

## 2020-10-16 MED ORDER — BISACODYL 5 MG PO TBEC: 5.0000 mg | DELAYED_RELEASE_TABLET | Freq: Every day | ORAL | Status: DC | PRN

## 2020-10-16 MED ORDER — POLYETHYLENE GLYCOL 3350 17 G PO PACK: 17.0000 g | PACK | Freq: Every day | ORAL | Status: DC | PRN

## 2020-10-16 MED ORDER — ALBUTEROL SULFATE HFA 108 (90 BASE) MCG/ACT IN AERS: 2.0000 | INHALATION_SPRAY | Freq: Four times a day (QID) | RESPIRATORY_TRACT | Status: DC | PRN

## 2020-10-16 MED ORDER — ACETAMINOPHEN 650 MG RE SUPP: 650.0000 mg | Freq: Four times a day (QID) | RECTAL | Status: DC | PRN

## 2020-10-16 MED ORDER — AMLODIPINE BESYLATE 10 MG PO TABS
10.0000 mg | ORAL_TABLET | Freq: Every day | ORAL | Status: DC
  Administered 2020-10-16 – 2020-10-23 (×8): 10 mg via ORAL
  Filled 2020-10-16 (×3): qty 1
  Filled 2020-10-16: qty 2
  Filled 2020-10-16 (×4): qty 1

## 2020-10-16 MED ORDER — ROSUVASTATIN CALCIUM 20 MG PO TABS
20.0000 mg | ORAL_TABLET | Freq: Every day | ORAL | Status: DC
  Administered 2020-10-16 – 2020-10-22 (×7): 20 mg via ORAL
  Filled 2020-10-16 (×7): qty 1

## 2020-10-16 MED ORDER — DEXAMETHASONE SODIUM PHOSPHATE 4 MG/ML IJ SOLN
4.0000 mg | Freq: Four times a day (QID) | INTRAMUSCULAR | Status: DC
  Administered 2020-10-16 – 2020-10-19 (×14): 4 mg via INTRAVENOUS
  Filled 2020-10-16 (×14): qty 1

## 2020-10-16 MED ORDER — ONDANSETRON HCL 4 MG/2ML IJ SOLN: 4.0000 mg | Freq: Four times a day (QID) | INTRAMUSCULAR | Status: DC | PRN

## 2020-10-16 MED ORDER — LISINOPRIL 20 MG PO TABS
40.0000 mg | ORAL_TABLET | Freq: Every day | ORAL | Status: DC
  Administered 2020-10-16 – 2020-10-23 (×8): 40 mg via ORAL
  Filled 2020-10-16 (×8): qty 2

## 2020-10-16 MED ORDER — IOHEXOL 300 MG/ML  SOLN
100.0000 mL | Freq: Once | INTRAMUSCULAR | Status: AC | PRN
  Administered 2020-10-16: 100 mL via INTRAVENOUS

## 2020-10-16 MED ORDER — LATANOPROST 0.005 % OP SOLN
1.0000 [drp] | Freq: Every day | OPHTHALMIC | Status: DC
  Administered 2020-10-16 – 2020-10-22 (×7): 1 [drp] via OPHTHALMIC
  Filled 2020-10-16: qty 2.5

## 2020-10-16 MED ORDER — ACETAMINOPHEN 325 MG PO TABS
650.0000 mg | ORAL_TABLET | Freq: Four times a day (QID) | ORAL | Status: DC | PRN
  Administered 2020-10-20 – 2020-10-21 (×2): 650 mg via ORAL
  Filled 2020-10-16 (×2): qty 2

## 2020-10-16 NOTE — H&P (Signed)
History and Physical    Benjamin Willis. ZPH:[id number] DOB: 1946-02-13 DOA: 10/15/2020  PCP: Horald Pollen, MD Consultants:  Sondra Come - rad onc Patient coming from:  Home - lives alone; NOK: Sarita Haver, [phone]   Chief Complaint: Weakness  HPI: Benjamin Willis. is a 75 y.o. male with medical history significant of DM; HTN; HLD; and metastatic lung cancer presenting with weakness. He had a screening CT with a RUL nodule.  He was scheduled for thoracoscopy with wedge resection in 09/2018 but the patient canceled the procedure and was lost to follow up.  On repeat imaging in 01/2019 there was interval progression on imaging.   He had  a bronch on 9/30 with confirmation of Stage 1A2 adenocarcinoma and initiated radiation therapy.  PET scan in 02/2020 showed 3 hypermetabolic RUL nodules that were c/w disease recurrence.  He was supposed to return for CT simulation on 02/20/20 and appears to have again been lost to follow-up.  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  He attended school through about 5th or 6th grade.  He reports that he came to the ER because his sister made him get off the couch and come in.  He fell sometime ago and appears to have been mostly confined to the couch recently due to generalized weakness.  He does report that he has a "long leg and a short leg" and the short (left) leg apparently gave way and caused him to fall.    I spoke with his sister and he lives near her.  He does have some learning challenges, does not read or write well.  With the COVID, she was not allowed to attend appointments with her brother and she was unaware of the issues.  His sister is interested in palliative care and [facility] services.  She says he "has a zest for life" and thinks he would want resuscitation.      ED Course: Metastatic cancer - brain, chest, abdomen.  Lives alone, generalized weakness.  MRI brain planned.  CT  C/A/P pending.  Will need oncology involvement.   Review of Systems: As per HPI; otherwise review of systems reviewed and negative.   Ambulatory Status:  Ambulates without assistance  COVID Vaccine Status:  Complete - J&J  Past Medical History:  Diagnosis Date   Allergy    Diabetes mellitus without complication ([place])    Type II   Glaucoma 10/16/2020   Hyperlipidemia    Hypertension    Lung cancer ([place])    Stage IA2 (cT1b, N0) non-small cell lung cancer (adenocarcinoma)    Past Surgical History:  Procedure Laterality Date   ELECTROMAGNETIC NAVIGATION BROCHOSCOPY Right 02/02/2019   Procedure: VIDEO BRONCHOSCOPY WITH NAVIGATION AND FIDUCIAL PLACEMENT;  Surgeon: Garner Nash, DO;  Location: [place];  Service: Cardiopulmonary;  Laterality: Right;   FUDUCIAL PLACEMENT Right 02/02/2019   Procedure: Placement Of Fuducial Right Upper Lobe;  Surgeon: Garner Nash, DO;  Location: [place];  Service: Cardiopulmonary;  Laterality: Right;   none     VIDEO BRONCHOSCOPY WITH RADIAL ENDOBRONCHIAL ULTRASOUND Right 02/02/2019   Procedure: Video Bronchoscopy With Radial Endobronchial Ultrasound;  Surgeon: Garner Nash, DO;  Location: [place];  Service: Cardiopulmonary;  Laterality: Right;    Social History   Socioeconomic History   Marital status: Single    Spouse name: Not on file   Number of children: 0   Years of education: Not  on file   Highest education level: 9th grade  Occupational History   Not on file  Tobacco Use   Smoking status: Former    Packs/day: 1.00    Years: 48.00    Pack years: 48.00    Types: Cigarettes    Quit date: 02/20/2015    Years since quitting: 5.6   Smokeless tobacco: Former    Quit date: 10/2013  Vaping Use   Vaping Use: Never used  Substance and Sexual Activity   Alcohol use: No   Drug use: No   Sexual activity: Yes    Birth control/protection: Condom  Other Topics Concern   Not on file  Social History Narrative   Not on file   Social  Determinants of Health   Financial Resource Strain: Not on file  Food Insecurity: Not on file  Transportation Needs: Not on file  Physical Activity: Not on file  Stress: Not on file  Social Connections: Not on file  Intimate Partner Violence: Not on file    No Known Allergies  Family History  Problem Relation Age of Onset   Lung cancer Neg Hx     Prior to Admission medications   Medication Sig Start Date End Date Taking? Authorizing Provider  albuterol (VENTOLIN HFA) 108 (90 Base) MCG/ACT inhaler Inhale 2 puffs into the lungs every 6 (six) hours as needed for wheezing or shortness of breath. 01/19/19   Icard, Leory Plowman L, DO  amLODipine (NORVASC) 10 MG tablet Take 1 tablet (10 mg total) by mouth daily. 12/06/19   Horald Pollen, MD  azithromycin (ZITHROMAX) 250 MG tablet Take 1 tablet (250 mg total) by mouth daily. Take first 2 tablets together, then 1 every day until finished. 10/11/20   Valarie Merino, MD  blood glucose meter kit and supplies Per insurance preference. Check blood glucose once a day. Dx E11.9 04/18/19   Horald Pollen, MD  Blood Pressure Monitoring (BLOOD PRESSURE MONITOR/ARM) DEVI Check BP once a day. DX I 10 04/18/19   Horald Pollen, MD  cetirizine (ZYRTEC) 10 MG tablet Take 1 tablet (10 mg total) by mouth daily. Patient not taking: Reported on 09/03/2020 09/03/17   Jaynee Eagles, PA-C  glipiZIDE (GLUCOTROL) 5 MG tablet TAKE 1 TABLET BY MOUTH TWICE DAILY WITH A MEAL 10/08/20   Horald Pollen, MD  latanoprost (XALATAN) 0.005 % ophthalmic solution  01/24/20   [provider]  lisinopril (ZESTRIL) 40 MG tablet Take 1 tablet (40 mg total) by mouth daily. 12/06/19 03/05/20  Horald Pollen, MD  metFORMIN (GLUCOPHAGE) 500 MG tablet Take 1 tablet (500 mg total) by mouth 2 (two) times daily with a meal. Take 1 tablet by mouth once daily with breakfast 03/01/20 05/30/20  Horald Pollen, MD  Multiple Vitamins-Minerals (MULTIVITAMIN MEN) TABS  Take 1 tablet by mouth daily.    [provider]  rosuvastatin (CRESTOR) 20 MG tablet Take 20 mg by mouth at bedtime. 10/27/19   [provider]  sitaGLIPtin (JANUVIA) 100 MG tablet Take 1 tablet (100 mg total) by mouth daily. 05/31/20 08/29/20  Horald Pollen, MD    Physical Exam: Vitals:   10/16/20 1230 10/16/20 1245 10/16/20 1300 10/16/20 1315  BP:      Pulse: 64  62 60  Resp: $Remo'15 10 18 15  'kOJMC$ Temp:      TempSrc:      SpO2: 96%  100% 100%     General:  Appears calm and comfortable and is in NAD  Eyes:  PERRL, EOMI, normal lids, iris ENT:  grossly normal hearing, lips & tongue, mmm Neck:  no LAD, masses or thyromegaly Cardiovascular:  RR with bradycardia, no m/r/g. No LE edema.  Respiratory:   CTA bilaterally with no wheezes/rales/rhonchi.  Normal respiratory effort. Abdomen:  soft, NT, ND Skin:  no rash or induration seen on limited exam Musculoskeletal:  grossly normal tone BUE/BLE, good ROM, no bony abnormality Psychiatric:  grossly normal mood and affect, speech fluent and appropriate, cognitively simple Neurologic:  CN 2-12 grossly intact, moves all extremities in coordinated fashion    Radiological Exams on Admission: Independently reviewed - see discussion in A/P where applicable  DG Chest 2 View  Result Date: 10/15/2020 CLINICAL DATA:  75 year old male with weakness. EXAM: CHEST - 2 VIEW COMPARISON:  Chest radiograph dated 10/11/2020. FINDINGS: Shallow inspiration. Focal area of rounded opacity with several associated fiducial markers as seen on the prior radiograph. No new consolidation. There is no pleural effusion or pneumothorax. Mild cardiomegaly. No acute osseous pathology. IMPRESSION: 1. No acute cardiopulmonary process. No interval change in the appearance of the rounded right upper lobe density. 2. Mild cardiomegaly. Electronically Signed   By: Anner Crete M.D.   On: 10/15/2020 20:41   CT Head Wo Contrast  Result Date:  10/15/2020 CLINICAL DATA:  Weakness EXAM: CT HEAD WITHOUT CONTRAST TECHNIQUE: Contiguous axial images were obtained from the base of the skull through the vertex without intravenous contrast. COMPARISON:  None. FINDINGS: Brain: Cystic mass is seen in the right parietal lobe measuring 3.3 cm with surrounding extensive vasogenic edema. Probable cystic mass in the left occipital lobe measuring 2 cm with surrounding edema. Possible additional lesion in the right occipital lobe and right cerebellar hemisphere. Findings suspicious for metastatic disease. No hemorrhage. No midline shift or hydrocephalus. Vascular: No hyperdense vessel or unexpected calcification. Skull: No acute calvarial abnormality. Sinuses/Orbits: No acute findings Other: None IMPRESSION: Multiple apparent cystic lesions within the brain with surrounding vasogenic edema concerning for metastatic disease. Recommend further evaluation with MRI. These results were called by telephone at the time of interpretation on 10/15/2020 at 9:03 pm to provider [facility] , who verbally acknowledged these results. Electronically Signed   By: Rolm Baptise M.D.   On: 10/15/2020 21:05   MR Brain W and Wo Contrast  Result Date: 10/16/2020 CLINICAL DATA:  Left greater than right-sided weakness. Multiple brain lesions on CT. History of lung cancer. EXAM: MRI HEAD WITHOUT AND WITH CONTRAST TECHNIQUE: Multiplanar, multiecho pulse sequences of the brain and surrounding structures were obtained without and with intravenous contrast. CONTRAST:  7.25mL GADAVIST GADOBUTROL 1 MMOL/ML IV SOLN COMPARISON:  Head CT 10/15/2020 FINDINGS: Brain: Partially cystic lesions with irregular peripheral enhancement measured 2.2 x 1.7 cm in the posteromedial right cerebellar hemisphere (series 10, image 16), 2.6 x 2.1 cm in the left occipital lobe (series 10, image 27), 2.4 x 2.0 cm in the medial right parietal lobe (series 10, image 32), and 3.3 x 3.1 cm in the posterior right frontal  lobe (series 10, image 40). There is a small amount of chronic blood products associated with the lesions. There is extensive vasogenic edema associated with the posterior right frontal lesion and milder edema associated with the other cerebral and cerebellar lesions. There is no midline shift. Scattered small foci of T2 hyperintensity in the cerebral white matter and pons are nonspecific but compatible with mild chronic small vessel ischemic disease. No acute infarct or extra-axial fluid collection is evident. Cerebral volume is  within normal limits for age. Vascular: Major intracranial vascular flow voids are preserved. Skull and upper cervical spine: Unremarkable bone marrow signal. Sinuses/Orbits: Mild depression of the right lamina papyracea which may reflect a remote orbital fracture. Paranasal sinuses and mastoid air cells are clear. Other: None. IMPRESSION: 1. Four brain lesions consistent with metastases. Extensive vasogenic edema associated with the posterior right frontal lesion without midline shift. 2. Mild chronic small vessel ischemic disease. Electronically Signed   By: Logan Bores M.D.   On: 10/16/2020 09:25   CT CHEST ABDOMEN PELVIS W CONTRAST  Result Date: 10/16/2020 CLINICAL DATA:  Brain metastases, staging, history of lung cancer EXAM: CT CHEST, ABDOMEN, AND PELVIS WITH CONTRAST TECHNIQUE: Multidetector CT imaging of the chest, abdomen and pelvis was performed following the standard protocol during bolus administration of intravenous contrast. CONTRAST:  183mL OMNIPAQUE IOHEXOL 300 MG/ML  SOLN COMPARISON:  02/13/2020 FINDINGS: CT CHEST FINDINGS Cardiovascular: Aortic atherosclerosis. Normal heart size. No pericardial effusion. Mediastinum/Nodes: No enlarged mediastinal, hilar, or axillary lymph nodes. Thyroid gland, trachea, and esophagus demonstrate no significant findings. Lungs/Pleura: Expected interval evolution of radiation fibrosis in the perihilar and suprahilar right lung. A nodular  component in the central right upper lobe is minimally decreased in size although more difficult to appreciate against a background of developing fibrosis, measuring approximately 1.4 x 1.3 cm, previously 1.5 x 1.5 cm (series 5, image 51). No pleural effusion or pneumothorax. Musculoskeletal: No chest wall mass or suspicious bone lesions identified. CT ABDOMEN PELVIS FINDINGS Hepatobiliary: No solid liver abnormality is seen. No gallstones, gallbladder wall thickening, or biliary dilatation. Pancreas: Unremarkable. No pancreatic ductal dilatation or surrounding inflammatory changes. Spleen: Normal in size without significant abnormality. Adrenals/Urinary Tract: Stable small right adrenal adenoma, measuring 1.3 x 0.9 cm (series 3, image 61). Kidneys are normal, without renal calculi, solid lesion, or hydronephrosis. Bladder is unremarkable. Stomach/Bowel: Stomach is within normal limits. Appendix appears normal. No evidence of bowel wall thickening, distention, or inflammatory changes. Vascular/Lymphatic: Aortic atherosclerosis. No enlarged abdominal or pelvic lymph nodes. Reproductive: No mass or other abnormality. Other: Small, fat containing bilateral inguinal hernias. No abdominopelvic ascites. Musculoskeletal: No acute or significant osseous findings. IMPRESSION: 1. Expected interval evolution of radiation fibrosis in the perihilar and suprahilar right lung. A nodular component in the central right upper lobe is minimally decreased in size although more difficult to appreciate against a background of developing fibrosis. Other previously described nodules are not well appreciated. Findings are consistent with treatment response and expected post treatment appearance. 2. No evidence of lymphadenopathy or metastatic disease in the chest, abdomen or pelvis. Aortic Atherosclerosis (ICD10-I70.0). Electronically Signed   By: Eddie Candle M.D.   On: 10/16/2020 10:21    EKG: Independently reviewed.  Sinus bradycardia  with rate 56; no evidence of acute ischemia   Labs on Admission: I have personally reviewed the available labs and imaging studies at the time of the admission.  Pertinent labs:   Glucose 118 HS troponin 6, 6 Normal CBC   Assessment/Plan Principal Problem:   Metastatic lung cancer (metastasis from lung to other site) [facility]) Active Problems:   Dyslipidemia associated with type 2 diabetes mellitus ([place])   Hypertension associated with type 2 diabetes mellitus ([place])   Diabetes mellitus type 2 in nonobese ([facility])   Glaucoma    Metastatic lung cancer -Patient with h/o stage 1 NSCLC diagnosed in 2020 -He has been lost to follow up several times -He did complete radiation therapy but had recurrence and was again lost  to f/u and so did not receive treatment -He is now presenting with generalized weakness -Brain imaging indicates multiple metastatic lesions with vasogenic edema -Briefly discussed with pulm, but it does not appear that there is a significant role for them at this time -Patient has never seen medical oncology; consult requested -Patient has a relationship with Dr. Sondra Come and he has been consulted and will arrange for initiation of brain radiation -Decadron 4 mg IV q6h has been started -Patient has apparent Intellectual Disability and does not appear to understand his illness; palliative care consultation has been requested -PT/OT/TOC team consults also requested, as he appears likely to benefit from support at home vs. Placement  DM -A1c 7.4 on recent check -hold Glucophage, Glucotrol, Januvia -Cover with moderate-scale SSI   HTN -Continue Norvasc, Lisinopril  HLD -Continue Crestor  Glaucoma -Continue Xalatan    Note: This patient has been tested and is pending for the novel coronavirus COVID-19. The patient has been fully vaccinated against COVID-19.   Level of care: Med-Surg DVT prophylaxis: SCDs Code Status:  Full - confirmed with patient/family Family  Communication: None present; I spoke with the patient's sister by telephone at the time of admission. Disposition Plan:  The patient is from: home  Anticipated d/c is to: be determined.  Anticipated d/c date will depend on clinical response to treatment, likely several days  Patient is currently: acutely ill Consults called: Pulmonology - telephone only; Oncology; Rad Onc; Palliative care; PT/OT/TOC team  Admission status:  Admit - It is my clinical opinion that admission to INPATIENT is reasonable and necessary because of the expectation that this patient will require hospital care that crosses at least 2 midnights to treat this condition based on the medical complexity of the problems presented.  Given the aforementioned information, the predictability of an adverse outcome is felt to be significant.    Karmen Bongo MD [facility]   How to contact the [facility] Attending or Consulting provider [place] or covering provider during after hours [place], for this patient?  Check the care team in [facility] and look for a) attending/consulting [facility] provider listed and b) the [facility] team listed Log into [email or web address] and use [place] universal password to access. If you do not have the password, please contact the hospital operator. Locate the [facility] provider you are looking for under [facility] and page to a number that you can be directly reached. If you still have difficulty reaching the provider, please page the [facility] (Director on Call) for the Hospitalists listed on amion for assistance.   10/16/2020, 1:35 PM

## 2020-10-16 NOTE — ED Notes (Signed)
To MRI, covid swab sent.

## 2020-10-16 NOTE — ED Notes (Signed)
[facility] here to transport pt to [facility] ED. Dr. Cherly Anderson. Dr. Eulis Foster accepting.

## 2020-10-16 NOTE — Consult Note (Signed)
[place]  Telephone:([phone]) [phone] Fax:(336) [phone]   [place]  Referral MD: Dr. Karmen Bongo  Reason for Referral: Lung cancer, brain metastases  HPI: Benjamin Willis is a 75 year old male with a past medical history significant for diabetes mellitus, hypertension, hyperlipidemia, Stage IA2 (cT1b, N0) non-small cell lung cancer (adenocarcinoma) status post SBRT to the right lung 02/21/2019 through 02/28/2019 followed by additional radiation to 3 lung nodules which were recurrent disease 02/29/2020 through 04/16/2020. He was treated by Dr. Sondra Come. I do not see that he has followed up with radiation oncology since that time.  The patient presented to the emergency department with weakness.  CT of the head without contrast showed multiple apparent cystic lesions within the brain with surrounding vasogenic edema concerning for metastatic disease.  This was followed by an MRI of the brain with and without contrast which showed 4 brain lesions consistent with metastases, extensive vasogenic edema associated with the posterior right frontal lesion without midline shift.  He also had a CT of the chest/abdomen/pelvis with contrast which showed expected interval evolution of radiation fibrosis in the perihilar and suprahilar right lung, nodular component in the central right upper lobe is minimally decreased in size although more difficult to appreciate against a background of developing fibrosis, other previously described nodules not well appreciated and findings are consistent with treatment response and expected posttreatment appearance, no evidence of lymphadenopathy or metastatic disease in the chest, abdomen, pelvis.  The patient has been started on dexamethasone.  The patient was seen in the emergency room today.  He is awaiting transfer to [facility].  Patient tells me that he feels significantly better at this time.  The patient's biggest issue at  home was generalized weakness x1 week.  He also reports that he has had increased cough and sputum in the morning.  Reports sputum is thick and white.  Denies hemoptysis. The patient denies recent fevers, chills.  He reports a good appetite and states that he has lost "a little bit" of weight.  He cannot quantify this.  The patient denies chest pain, shortness of breath, abdominal pain, nausea, vomiting, constipation, diarrhea.  Denies lower extremity edema.  Denies bleeding.  The patient is single.  He lives alone.  He has a sister who lives within walking distance and a landlord that lives across the hall with a checks in on him.  He denies history of alcohol use.  He previously smoked a pack of cigarettes per day for 48 years.  Quit in 2016.  Medical oncology was asked see the patient for recommendations regarding his history of lung cancer and new brain mets.   Past Medical History:  Diagnosis Date   Allergy    Diabetes mellitus without complication ([place])    Type II   Hyperlipidemia    Hypertension    Lung cancer ([place])    Stage IA2 (cT1b, N0) non-small cell lung cancer (adenocarcinoma)  :   Past Surgical History:  Procedure Laterality Date   ELECTROMAGNETIC NAVIGATION BROCHOSCOPY Right 02/02/2019   Procedure: VIDEO BRONCHOSCOPY WITH NAVIGATION AND FIDUCIAL PLACEMENT;  Surgeon: Garner Nash, DO;  Location: [place];  Service: Cardiopulmonary;  Laterality: Right;   FUDUCIAL PLACEMENT Right 02/02/2019   Procedure: Placement Of Fuducial Right Upper Lobe;  Surgeon: Garner Nash, DO;  Location: [place];  Service: Cardiopulmonary;  Laterality: Right;   none     VIDEO BRONCHOSCOPY WITH RADIAL ENDOBRONCHIAL ULTRASOUND Right 02/02/2019   Procedure: Video  Bronchoscopy With Radial Endobronchial Ultrasound;  Surgeon: Garner Nash, DO;  Location: [facility];  Service: Cardiopulmonary;  Laterality: Right;  :   Current Facility-Administered Medications  Medication Dose Route Frequency Provider Last  Rate Last Admin   dexamethasone (DECADRON) injection 4 mg  4 mg Intravenous Q6H Karmen Bongo, MD       Current Outpatient Medications  Medication Sig Dispense Refill   albuterol (VENTOLIN HFA) 108 (90 Base) MCG/ACT inhaler Inhale 2 puffs into the lungs every 6 (six) hours as needed for wheezing or shortness of breath. 18 g 6   allopurinol (ZYLOPRIM) 100 MG tablet Take 100 mg by mouth daily.     amLODipine (NORVASC) 10 MG tablet Take 1 tablet (10 mg total) by mouth daily. 90 tablet 3   blood glucose meter kit and supplies Per insurance preference. Check blood glucose once a day. Dx E11.9 1 each 3   Blood Pressure Monitoring (BLOOD PRESSURE MONITOR/ARM) DEVI Check BP once a day. DX I 10 1 each 0   cetirizine (ZYRTEC) 10 MG tablet Take 1 tablet (10 mg total) by mouth daily. (Patient taking differently: Take 10 mg by mouth daily as needed for allergies.) 30 tablet 11   furosemide (LASIX) 20 MG tablet Take 20 mg by mouth daily.     glipiZIDE (GLUCOTROL) 5 MG tablet TAKE 1 TABLET BY MOUTH TWICE DAILY WITH A MEAL (Patient taking differently: Take 5 mg by mouth 2 (two) times daily before a meal.) 180 tablet 0   latanoprost (XALATAN) 0.005 % ophthalmic solution Place 1 drop into both eyes at bedtime.     lisinopril (ZESTRIL) 40 MG tablet Take 1 tablet (40 mg total) by mouth daily. 90 tablet 3   metFORMIN (GLUCOPHAGE) 500 MG tablet Take 1 tablet (500 mg total) by mouth 2 (two) times daily with a meal. Take 1 tablet by mouth once daily with breakfast 180 tablet 1   Multiple Vitamins-Minerals (MULTIVITAMIN MEN) TABS Take 1 tablet by mouth daily.     rosuvastatin (CRESTOR) 20 MG tablet Take 20 mg by mouth at bedtime.     sitaGLIPtin (JANUVIA) 100 MG tablet Take 1 tablet (100 mg total) by mouth daily. 90 tablet 3   azithromycin (ZITHROMAX) 250 MG tablet Take 1 tablet (250 mg total) by mouth daily. Take first 2 tablets together, then 1 every day until finished. (Patient not taking: No sig reported) 6 tablet 0      No Known Allergies:   Family History  Problem Relation Age of Onset   Lung cancer Neg Hx   :   Social History   Socioeconomic History   Marital status: Single    Spouse name: Not on file   Number of children: 0   Years of education: Not on file   Highest education level: 9th grade  Occupational History   Not on file  Tobacco Use   Smoking status: Former    Packs/day: 1.00    Years: 48.00    Pack years: 48.00    Types: Cigarettes    Quit date: 02/20/2015    Years since quitting: 5.6   Smokeless tobacco: Former    Quit date: 10/2013  Vaping Use   Vaping Use: Never used  Substance and Sexual Activity   Alcohol use: No   Drug use: No   Sexual activity: Yes    Birth control/protection: Condom  Other Topics Concern   Not on file  Social History Narrative   Not on file   Social  Determinants of Health   Financial Resource Strain: Not on file  Food Insecurity: Not on file  Transportation Needs: Not on file  Physical Activity: Not on file  Stress: Not on file  Social Connections: Not on file  Intimate Partner Violence: Not on file  :  Review of Systems: A comprehensive 14 point review of systems was negative except as noted in the HPI.  Exam: Patient Vitals for the past 24 hrs:  BP Temp Temp src Pulse Resp SpO2  10/16/20 1015 (!) 156/77 -- -- (!) 56 16 100 %  10/16/20 1000 (!) 163/96 -- -- 67 16 97 %  10/16/20 0945 (!) 163/75 -- -- (!) 57 15 98 %  10/16/20 0800 (!) 164/79 -- -- (!) 59 13 99 %  10/16/20 0700 (!) 152/72 -- -- (!) 54 14 97 %  10/16/20 0554 (!) 149/82 97.8 F (36.6 C) Oral (!) 57 16 100 %  10/16/20 0350 (!) 154/81 98.4 F (36.9 C) Oral (!) 58 20 100 %  10/15/20 2318 (!) 162/84 98.6 F (37 C) Oral (!) 57 17 100 %  10/15/20 1924 (!) 173/77 -- -- (!) 57 16 98 %  10/15/20 1631 (!) 155/69 98.6 F (37 C) Oral 69 14 100 %    General: Awake and alert, no distress. Eyes:  no scleral icterus.   ENT:  There were no oropharyngeal lesions.    Neck was without thyromegaly.   Lymphatics:  Negative cervical, supraclavicular or axillary adenopathy.   Respiratory: lungs were clear bilaterally without wheezing or crackles.   Cardiovascular:  Regular rate and rhythm, S1/S2, without murmur, rub or gallop.  There was no pedal edema.   GI:  abdomen was soft, flat, nontender, nondistended, without organomegaly.   Musculoskeletal: Strength symmetrical in the upper and lower extremities. Skin exam was without echymosis, petichae.   Neuro exam was nonfocal. Patient was alert and oriented.  Attention was good.   Language was appropriate.  Mood was normal without depression.  Speech was not pressured.  Thought content was not tangential.     Lab Results  Component Value Date   WBC 4.6 10/15/2020   HGB 13.0 10/15/2020   HCT 41.1 10/15/2020   PLT 245 10/15/2020   GLUCOSE 118 (H) 10/15/2020   CHOL 132 09/03/2020   TRIG 73.0 09/03/2020   HDL 45.90 09/03/2020   LDLCALC 72 09/03/2020   ALT 17 10/15/2020   AST 18 10/15/2020   NA 140 10/15/2020   K 4.1 10/15/2020   CL 106 10/15/2020   CREATININE 0.72 10/15/2020   BUN 11 10/15/2020   CO2 25 10/15/2020    DG Chest 2 View  Result Date: 10/15/2020 CLINICAL DATA:  75 year old male with weakness. EXAM: CHEST - 2 VIEW COMPARISON:  Chest radiograph dated 10/11/2020. FINDINGS: Shallow inspiration. Focal area of rounded opacity with several associated fiducial markers as seen on the prior radiograph. No new consolidation. There is no pleural effusion or pneumothorax. Mild cardiomegaly. No acute osseous pathology. IMPRESSION: 1. No acute cardiopulmonary process. No interval change in the appearance of the rounded right upper lobe density. 2. Mild cardiomegaly. Electronically Signed   By: Anner Crete M.D.   On: 10/15/2020 20:41   DG Chest 2 View  Result Date: 10/11/2020 CLINICAL DATA:  Cough and dyspnea today. Non-small cell right lung cancer. EXAM: CHEST - 2 VIEW COMPARISON:  02/02/2019 chest  radiograph.  01/25/2020 chest CT. FINDINGS: Stable cardiomediastinal silhouette with normal heart size. No pneumothorax. No pleural effusion. Three clustered  fiducial markers in the upper right lung with surrounding patchy ill-defined opacity, which appears mildly increased from prior chest CT. Clear left lung. Nipple shadows overlie the lower lungs. IMPRESSION: Patchy ill-defined upper right lung opacity surrounding clustered fiducial markers, mildly increased from prior chest CT, nonspecific, differential including post treatment change, recurrent tumor or pneumonia. Chest CT suggested for further evaluation. Electronically Signed   By: Ilona Sorrel M.D.   On: 10/11/2020 14:14   CT Head Wo Contrast  Result Date: 10/15/2020 CLINICAL DATA:  Weakness EXAM: CT HEAD WITHOUT CONTRAST TECHNIQUE: Contiguous axial images were obtained from the base of the skull through the vertex without intravenous contrast. COMPARISON:  None. FINDINGS: Brain: Cystic mass is seen in the right parietal lobe measuring 3.3 cm with surrounding extensive vasogenic edema. Probable cystic mass in the left occipital lobe measuring 2 cm with surrounding edema. Possible additional lesion in the right occipital lobe and right cerebellar hemisphere. Findings suspicious for metastatic disease. No hemorrhage. No midline shift or hydrocephalus. Vascular: No hyperdense vessel or unexpected calcification. Skull: No acute calvarial abnormality. Sinuses/Orbits: No acute findings Other: None IMPRESSION: Multiple apparent cystic lesions within the brain with surrounding vasogenic edema concerning for metastatic disease. Recommend further evaluation with MRI. These results were called by telephone at the time of interpretation on 10/15/2020 at 9:03 pm to provider [facility] , who verbally acknowledged these results. Electronically Signed   By: Rolm Baptise M.D.   On: 10/15/2020 21:05   MR Brain W and Wo Contrast  Result Date: 10/16/2020 CLINICAL  DATA:  Left greater than right-sided weakness. Multiple brain lesions on CT. History of lung cancer. EXAM: MRI HEAD WITHOUT AND WITH CONTRAST TECHNIQUE: Multiplanar, multiecho pulse sequences of the brain and surrounding structures were obtained without and with intravenous contrast. CONTRAST:  7.49m GADAVIST GADOBUTROL 1 MMOL/ML IV SOLN COMPARISON:  Head CT 10/15/2020 FINDINGS: Brain: Partially cystic lesions with irregular peripheral enhancement measured 2.2 x 1.7 cm in the posteromedial right cerebellar hemisphere (series 10, image 16), 2.6 x 2.1 cm in the left occipital lobe (series 10, image 27), 2.4 x 2.0 cm in the medial right parietal lobe (series 10, image 32), and 3.3 x 3.1 cm in the posterior right frontal lobe (series 10, image 40). There is a small amount of chronic blood products associated with the lesions. There is extensive vasogenic edema associated with the posterior right frontal lesion and milder edema associated with the other cerebral and cerebellar lesions. There is no midline shift. Scattered small foci of T2 hyperintensity in the cerebral white matter and pons are nonspecific but compatible with mild chronic small vessel ischemic disease. No acute infarct or extra-axial fluid collection is evident. Cerebral volume is within normal limits for age. Vascular: Major intracranial vascular flow voids are preserved. Skull and upper cervical spine: Unremarkable bone marrow signal. Sinuses/Orbits: Mild depression of the right lamina papyracea which may reflect a remote orbital fracture. Paranasal sinuses and mastoid air cells are clear. Other: None. IMPRESSION: 1. Four brain lesions consistent with metastases. Extensive vasogenic edema associated with the posterior right frontal lesion without midline shift. 2. Mild chronic small vessel ischemic disease. Electronically Signed   By: ALogan BoresM.D.   On: 10/16/2020 09:25   CT CHEST ABDOMEN PELVIS W CONTRAST  Result Date: 10/16/2020 CLINICAL  DATA:  Brain metastases, staging, history of lung cancer EXAM: CT CHEST, ABDOMEN, AND PELVIS WITH CONTRAST TECHNIQUE: Multidetector CT imaging of the chest, abdomen and pelvis was performed following the standard protocol  during bolus administration of intravenous contrast. CONTRAST:  150m OMNIPAQUE IOHEXOL 300 MG/ML  SOLN COMPARISON:  02/13/2020 FINDINGS: CT CHEST FINDINGS Cardiovascular: Aortic atherosclerosis. Normal heart size. No pericardial effusion. Mediastinum/Nodes: No enlarged mediastinal, hilar, or axillary lymph nodes. Thyroid gland, trachea, and esophagus demonstrate no significant findings. Lungs/Pleura: Expected interval evolution of radiation fibrosis in the perihilar and suprahilar right lung. A nodular component in the central right upper lobe is minimally decreased in size although more difficult to appreciate against a background of developing fibrosis, measuring approximately 1.4 x 1.3 cm, previously 1.5 x 1.5 cm (series 5, image 51). No pleural effusion or pneumothorax. Musculoskeletal: No chest wall mass or suspicious bone lesions identified. CT ABDOMEN PELVIS FINDINGS Hepatobiliary: No solid liver abnormality is seen. No gallstones, gallbladder wall thickening, or biliary dilatation. Pancreas: Unremarkable. No pancreatic ductal dilatation or surrounding inflammatory changes. Spleen: Normal in size without significant abnormality. Adrenals/Urinary Tract: Stable small right adrenal adenoma, measuring 1.3 x 0.9 cm (series 3, image 61). Kidneys are normal, without renal calculi, solid lesion, or hydronephrosis. Bladder is unremarkable. Stomach/Bowel: Stomach is within normal limits. Appendix appears normal. No evidence of bowel wall thickening, distention, or inflammatory changes. Vascular/Lymphatic: Aortic atherosclerosis. No enlarged abdominal or pelvic lymph nodes. Reproductive: No mass or other abnormality. Other: Small, fat containing bilateral inguinal hernias. No abdominopelvic ascites.  Musculoskeletal: No acute or significant osseous findings. IMPRESSION: 1. Expected interval evolution of radiation fibrosis in the perihilar and suprahilar right lung. A nodular component in the central right upper lobe is minimally decreased in size although more difficult to appreciate against a background of developing fibrosis. Other previously described nodules are not well appreciated. Findings are consistent with treatment response and expected post treatment appearance. 2. No evidence of lymphadenopathy or metastatic disease in the chest, abdomen or pelvis. Aortic Atherosclerosis (ICD10-I70.0). Electronically Signed   By: AEddie CandleM.D.   On: 10/16/2020 10:21     DG Chest 2 View  Result Date: 10/15/2020 CLINICAL DATA:  75year old male with weakness. EXAM: CHEST - 2 VIEW COMPARISON:  Chest radiograph dated 10/11/2020. FINDINGS: Shallow inspiration. Focal area of rounded opacity with several associated fiducial markers as seen on the prior radiograph. No new consolidation. There is no pleural effusion or pneumothorax. Mild cardiomegaly. No acute osseous pathology. IMPRESSION: 1. No acute cardiopulmonary process. No interval change in the appearance of the rounded right upper lobe density. 2. Mild cardiomegaly. Electronically Signed   By: AAnner CreteM.D.   On: 10/15/2020 20:41   DG Chest 2 View  Result Date: 10/11/2020 CLINICAL DATA:  Cough and dyspnea today. Non-small cell right lung cancer. EXAM: CHEST - 2 VIEW COMPARISON:  02/02/2019 chest radiograph.  01/25/2020 chest CT. FINDINGS: Stable cardiomediastinal silhouette with normal heart size. No pneumothorax. No pleural effusion. Three clustered fiducial markers in the upper right lung with surrounding patchy ill-defined opacity, which appears mildly increased from prior chest CT. Clear left lung. Nipple shadows overlie the lower lungs. IMPRESSION: Patchy ill-defined upper right lung opacity surrounding clustered fiducial markers, mildly  increased from prior chest CT, nonspecific, differential including post treatment change, recurrent tumor or pneumonia. Chest CT suggested for further evaluation. Electronically Signed   By: JIlona SorrelM.D.   On: 10/11/2020 14:14   CT Head Wo Contrast  Result Date: 10/15/2020 CLINICAL DATA:  Weakness EXAM: CT HEAD WITHOUT CONTRAST TECHNIQUE: Contiguous axial images were obtained from the base of the skull through the vertex without intravenous contrast. COMPARISON:  None. FINDINGS: Brain: Cystic mass is  seen in the right parietal lobe measuring 3.3 cm with surrounding extensive vasogenic edema. Probable cystic mass in the left occipital lobe measuring 2 cm with surrounding edema. Possible additional lesion in the right occipital lobe and right cerebellar hemisphere. Findings suspicious for metastatic disease. No hemorrhage. No midline shift or hydrocephalus. Vascular: No hyperdense vessel or unexpected calcification. Skull: No acute calvarial abnormality. Sinuses/Orbits: No acute findings Other: None IMPRESSION: Multiple apparent cystic lesions within the brain with surrounding vasogenic edema concerning for metastatic disease. Recommend further evaluation with MRI. These results were called by telephone at the time of interpretation on 10/15/2020 at 9:03 pm to provider [facility] , who verbally acknowledged these results. Electronically Signed   By: Rolm Baptise M.D.   On: 10/15/2020 21:05   MR Brain W and Wo Contrast  Result Date: 10/16/2020 CLINICAL DATA:  Left greater than right-sided weakness. Multiple brain lesions on CT. History of lung cancer. EXAM: MRI HEAD WITHOUT AND WITH CONTRAST TECHNIQUE: Multiplanar, multiecho pulse sequences of the brain and surrounding structures were obtained without and with intravenous contrast. CONTRAST:  7.81m GADAVIST GADOBUTROL 1 MMOL/ML IV SOLN COMPARISON:  Head CT 10/15/2020 FINDINGS: Brain: Partially cystic lesions with irregular peripheral enhancement  measured 2.2 x 1.7 cm in the posteromedial right cerebellar hemisphere (series 10, image 16), 2.6 x 2.1 cm in the left occipital lobe (series 10, image 27), 2.4 x 2.0 cm in the medial right parietal lobe (series 10, image 32), and 3.3 x 3.1 cm in the posterior right frontal lobe (series 10, image 40). There is a small amount of chronic blood products associated with the lesions. There is extensive vasogenic edema associated with the posterior right frontal lesion and milder edema associated with the other cerebral and cerebellar lesions. There is no midline shift. Scattered small foci of T2 hyperintensity in the cerebral white matter and pons are nonspecific but compatible with mild chronic small vessel ischemic disease. No acute infarct or extra-axial fluid collection is evident. Cerebral volume is within normal limits for age. Vascular: Major intracranial vascular flow voids are preserved. Skull and upper cervical spine: Unremarkable bone marrow signal. Sinuses/Orbits: Mild depression of the right lamina papyracea which may reflect a remote orbital fracture. Paranasal sinuses and mastoid air cells are clear. Other: None. IMPRESSION: 1. Four brain lesions consistent with metastases. Extensive vasogenic edema associated with the posterior right frontal lesion without midline shift. 2. Mild chronic small vessel ischemic disease. Electronically Signed   By: ALogan BoresM.D.   On: 10/16/2020 09:25   CT CHEST ABDOMEN PELVIS W CONTRAST  Result Date: 10/16/2020 CLINICAL DATA:  Brain metastases, staging, history of lung cancer EXAM: CT CHEST, ABDOMEN, AND PELVIS WITH CONTRAST TECHNIQUE: Multidetector CT imaging of the chest, abdomen and pelvis was performed following the standard protocol during bolus administration of intravenous contrast. CONTRAST:  1020mOMNIPAQUE IOHEXOL 300 MG/ML  SOLN COMPARISON:  02/13/2020 FINDINGS: CT CHEST FINDINGS Cardiovascular: Aortic atherosclerosis. Normal heart size. No pericardial  effusion. Mediastinum/Nodes: No enlarged mediastinal, hilar, or axillary lymph nodes. Thyroid gland, trachea, and esophagus demonstrate no significant findings. Lungs/Pleura: Expected interval evolution of radiation fibrosis in the perihilar and suprahilar right lung. A nodular component in the central right upper lobe is minimally decreased in size although more difficult to appreciate against a background of developing fibrosis, measuring approximately 1.4 x 1.3 cm, previously 1.5 x 1.5 cm (series 5, image 51). No pleural effusion or pneumothorax. Musculoskeletal: No chest wall mass or suspicious bone lesions identified. CT ABDOMEN PELVIS  FINDINGS Hepatobiliary: No solid liver abnormality is seen. No gallstones, gallbladder wall thickening, or biliary dilatation. Pancreas: Unremarkable. No pancreatic ductal dilatation or surrounding inflammatory changes. Spleen: Normal in size without significant abnormality. Adrenals/Urinary Tract: Stable small right adrenal adenoma, measuring 1.3 x 0.9 cm (series 3, image 61). Kidneys are normal, without renal calculi, solid lesion, or hydronephrosis. Bladder is unremarkable. Stomach/Bowel: Stomach is within normal limits. Appendix appears normal. No evidence of bowel wall thickening, distention, or inflammatory changes. Vascular/Lymphatic: Aortic atherosclerosis. No enlarged abdominal or pelvic lymph nodes. Reproductive: No mass or other abnormality. Other: Small, fat containing bilateral inguinal hernias. No abdominopelvic ascites. Musculoskeletal: No acute or significant osseous findings. IMPRESSION: 1. Expected interval evolution of radiation fibrosis in the perihilar and suprahilar right lung. A nodular component in the central right upper lobe is minimally decreased in size although more difficult to appreciate against a background of developing fibrosis. Other previously described nodules are not well appreciated. Findings are consistent with treatment response and  expected post treatment appearance. 2. No evidence of lymphadenopathy or metastatic disease in the chest, abdomen or pelvis. Aortic Atherosclerosis (ICD10-I70.0). Electronically Signed   By: Eddie Candle M.D.   On: 10/16/2020 10:21    Pathology:  CYTOLOGY - NON PAP  CASE: [id number]  PATIENT: Benjamin Willis  Non-Gynecological Cytology Report   Clinical History: Right Upper Lobe Nodule   DIAGNOSIS:   A. LUNG, RUL NAVIGATION, BRUSHINGS:  - Malignant cells consistent with adenocarcinoma   B. LUNG, RUL NAVIGATION, NEEDLE ASPIRATION:  - Malignant cells consistent with adenocarcinoma   COMMENT:   Dr. Tresa Moore reviewed the case and agrees with the above diagnosis.  Dr.  Valeta Harms was notified of these results on February 04, 2019.   Assessment and Plan:  Benjamin Willis is a 75 year old male with a history of stage IA2 (cT1b, N0) non-small cell lung cancer, adenocarcinoma.  He has previously received SBRT to his right lung and then received additional radiation to 3 other lung nodules noted on PET scan.  He completed his last radiation in December 2021.  He has not had follow-up with radiation oncology since then.  Patient now presents to the hospital with increased weakness and increased cough.  MRI of the brain showed 4 brain lesions consistent with metastatic disease with extensive vasogenic edema.  He has been started on dexamethasone.  CT of the chest/abdomen/pelvis showed a response to prior radiation with no evidence of disease progression. Findings have been discussed with the patient today.  Recommend radiation oncology consultation for consideration of radiation to his for brain lesions.  ## History of stage I A2 (cT1b, N0) non-small cell lung cancer, adenocarcinoma now with brain metastases --Status post SBRT to the right lung 02/21/2019 through 02/28/2019 --Status post radiation to 3 lung nodules 02/29/2020 through 04/16/2020 --He now presents with weakness and increased cough and MRI of the  brain shows for brain metastases and vasogenic edema. --CT chest/abdomen/pelvis shows a response to prior radiation and no evidence of disease progression. --Agree with dexamethasone 4 mg every 6 hours for vasogenic edema. --Recommend radiation oncology consult for further recommendations regarding treatment of brain metastases.  Thank you for this referral.   Mikey Bussing, DNP, AGPCNP-BC, AOCNP

## 2020-10-16 NOTE — ED Provider Notes (Signed)
[facility] EMERGENCY DEPARTMENT Provider Note   CSN: [id number] Arrival date & time: 10/15/20  1626     History Chief Complaint  Patient presents with   Weakness    Ark Tyden Kann. is a 75 y.o. male.  HPI 75 year old male presents with weakness.  He has been feeling weak for about a day though talking to patient it sounds like maybe even a little longer.  He feels generally weak.  When further testing it seems like his left side is weaker he does endorse that this has been ongoing for about a week of left greater than right-sided weakness.  He is also had a nonspecific cough with some sputum.  No fevers or night sweats though he has lost some weight over the last week.  No headaches.  Some right upper abdominal soreness/pain for about 1 week as well.  He is a former smoker.  Denies significant alcohol use.  Past Medical History:  Diagnosis Date   Allergy    Diabetes mellitus without complication ([place])    Type II   Hyperlipidemia    Hypertension     Patient Active Problem List   Diagnosis Date Noted   Atherosclerosis of aorta ([place]) 05/31/2020   Primary cancer of right upper lobe of lung ([place]) 02/07/2019   Lung nodule 02/02/2019   Hypertension associated with type 2 diabetes mellitus ([place]) 05/24/2018   Dyslipidemia 05/24/2018   Dyslipidemia associated with type 2 diabetes mellitus ([place]) 06/09/2017    Past Surgical History:  Procedure Laterality Date   ELECTROMAGNETIC NAVIGATION BROCHOSCOPY Right 02/02/2019   Procedure: VIDEO BRONCHOSCOPY WITH NAVIGATION AND FIDUCIAL PLACEMENT;  Surgeon: Garner Nash, DO;  Location: [place];  Service: Cardiopulmonary;  Laterality: Right;   FUDUCIAL PLACEMENT Right 02/02/2019   Procedure: Placement Of Fuducial Right Upper Lobe;  Surgeon: Garner Nash, DO;  Location: [place];  Service: Cardiopulmonary;  Laterality: Right;   none     VIDEO BRONCHOSCOPY WITH RADIAL ENDOBRONCHIAL ULTRASOUND Right 02/02/2019   Procedure:  Video Bronchoscopy With Radial Endobronchial Ultrasound;  Surgeon: Garner Nash, DO;  Location: [place];  Service: Cardiopulmonary;  Laterality: Right;       Family History  Problem Relation Age of Onset   Lung cancer Neg Hx     Social History   Tobacco Use   Smoking status: Former    Packs/day: 1.00    Years: 48.00    Pack years: 48.00    Types: Cigarettes    Quit date: 02/20/2015    Years since quitting: 5.6   Smokeless tobacco: Former    Quit date: 10/2013  Vaping Use   Vaping Use: Never used  Substance Use Topics   Alcohol use: No   Drug use: No    Home Medications Prior to Admission medications   Medication Sig Start Date End Date Taking? Authorizing Provider  albuterol (VENTOLIN HFA) 108 (90 Base) MCG/ACT inhaler Inhale 2 puffs into the lungs every 6 (six) hours as needed for wheezing or shortness of breath. 01/19/19   Icard, Leory Plowman L, DO  amLODipine (NORVASC) 10 MG tablet Take 1 tablet (10 mg total) by mouth daily. 12/06/19   Horald Pollen, MD  azithromycin (ZITHROMAX) 250 MG tablet Take 1 tablet (250 mg total) by mouth daily. Take first 2 tablets together, then 1 every day until finished. 10/11/20   Valarie Merino, MD  blood glucose meter kit and supplies Per insurance preference. Check blood glucose once a day. Dx E11.9 04/18/19  Horald Pollen, MD  Blood Pressure Monitoring (BLOOD PRESSURE MONITOR/ARM) DEVI Check BP once a day. DX I 10 04/18/19   Horald Pollen, MD  cetirizine (ZYRTEC) 10 MG tablet Take 1 tablet (10 mg total) by mouth daily. Patient not taking: Reported on 09/03/2020 09/03/17   Jaynee Eagles, PA-C  glipiZIDE (GLUCOTROL) 5 MG tablet TAKE 1 TABLET BY MOUTH TWICE DAILY WITH A MEAL 10/08/20   Horald Pollen, MD  latanoprost (XALATAN) 0.005 % ophthalmic solution  01/24/20   [provider]  lisinopril (ZESTRIL) 40 MG tablet Take 1 tablet (40 mg total) by mouth daily. 12/06/19 03/05/20  Horald Pollen, MD  metFORMIN  (GLUCOPHAGE) 500 MG tablet Take 1 tablet (500 mg total) by mouth 2 (two) times daily with a meal. Take 1 tablet by mouth once daily with breakfast 03/01/20 05/30/20  Horald Pollen, MD  Multiple Vitamins-Minerals (MULTIVITAMIN MEN) TABS Take 1 tablet by mouth daily.    [provider]  rosuvastatin (CRESTOR) 20 MG tablet Take 20 mg by mouth at bedtime. 10/27/19   [provider]  sitaGLIPtin (JANUVIA) 100 MG tablet Take 1 tablet (100 mg total) by mouth daily. 05/31/20 08/29/20  Horald Pollen, MD    Allergies    Patient has no known allergies.  Review of Systems   Review of Systems  Constitutional:  Positive for unexpected weight change. Negative for diaphoresis and fever.  Respiratory:  Positive for cough. Negative for shortness of breath.   Cardiovascular:  Negative for chest pain.  Gastrointestinal:  Positive for abdominal pain. Negative for vomiting.  Neurological:  Positive for weakness. Negative for headaches.  All other systems reviewed and are negative.  Physical Exam Updated Vital Signs BP (!) 149/82   Pulse (!) 57   Temp 97.8 F (36.6 C) (Oral)   Resp 16   SpO2 100%   Physical Exam Vitals and nursing note reviewed.  Constitutional:      General: He is not in acute distress.    Appearance: He is well-developed.  HENT:     Head: Normocephalic and atraumatic.     Right Ear: External ear normal.     Left Ear: External ear normal.     Nose: Nose normal.  Eyes:     General:        Right eye: No discharge.        Left eye: No discharge.     Extraocular Movements: Extraocular movements intact.     Pupils: Pupils are equal, round, and reactive to light.  Cardiovascular:     Rate and Rhythm: Normal rate and regular rhythm.     Heart sounds: Normal heart sounds.  Pulmonary:     Effort: Pulmonary effort is normal.     Breath sounds: Normal breath sounds.  Abdominal:     Palpations: Abdomen is soft.     Tenderness: There is abdominal  tenderness.     Comments: Mild, generalized  Musculoskeletal:     Cervical back: Neck supple.  Skin:    General: Skin is warm and dry.  Neurological:     Mental Status: He is alert.     Comments: Cranial nerves grossly intact.  Normal strength in right upper and right lower extremities.  4/5 strength in left upper and left lower extremities with drift in his left upper extremity.  He has a hard time walking due to this weakness and needs assistance when walking in the room.  Psychiatric:  Mood and Affect: Mood is not anxious.    ED Results / Procedures / Treatments   Labs (all labs ordered are listed, but only abnormal results are displayed) Labs Reviewed  COMPREHENSIVE METABOLIC PANEL - Abnormal; Notable for the following components:      Result Value   Glucose, Bld 118 (*)    All other components within normal limits  URINE CULTURE  CBC WITH DIFFERENTIAL/PLATELET  LIPASE, BLOOD  MAGNESIUM  URINALYSIS, ROUTINE W REFLEX MICROSCOPIC  TROPONIN I (HIGH SENSITIVITY)  TROPONIN I (HIGH SENSITIVITY)    EKG EKG Interpretation  Date/Time:  Monday October 15 2020 19:23:26 EDT Ventricular Rate:  56 PR Interval:  130 QRS Duration: 96 QT Interval:  434 QTC Calculation: 418 R Axis:   69 Text Interpretation: Sinus bradycardia Otherwise normal ECG Normal axis No acute ischemia Confirmed by Lorre Munroe ([id number]) on 10/15/2020 8:20:00 PM  Radiology DG Chest 2 View  Result Date: 10/15/2020 CLINICAL DATA:  75 year old male with weakness. EXAM: CHEST - 2 VIEW COMPARISON:  Chest radiograph dated 10/11/2020. FINDINGS: Shallow inspiration. Focal area of rounded opacity with several associated fiducial markers as seen on the prior radiograph. No new consolidation. There is no pleural effusion or pneumothorax. Mild cardiomegaly. No acute osseous pathology. IMPRESSION: 1. No acute cardiopulmonary process. No interval change in the appearance of the rounded right upper lobe density. 2. Mild  cardiomegaly. Electronically Signed   By: Anner Crete M.D.   On: 10/15/2020 20:41   CT Head Wo Contrast  Result Date: 10/15/2020 CLINICAL DATA:  Weakness EXAM: CT HEAD WITHOUT CONTRAST TECHNIQUE: Contiguous axial images were obtained from the base of the skull through the vertex without intravenous contrast. COMPARISON:  None. FINDINGS: Brain: Cystic mass is seen in the right parietal lobe measuring 3.3 cm with surrounding extensive vasogenic edema. Probable cystic mass in the left occipital lobe measuring 2 cm with surrounding edema. Possible additional lesion in the right occipital lobe and right cerebellar hemisphere. Findings suspicious for metastatic disease. No hemorrhage. No midline shift or hydrocephalus. Vascular: No hyperdense vessel or unexpected calcification. Skull: No acute calvarial abnormality. Sinuses/Orbits: No acute findings Other: None IMPRESSION: Multiple apparent cystic lesions within the brain with surrounding vasogenic edema concerning for metastatic disease. Recommend further evaluation with MRI. These results were called by telephone at the time of interpretation on 10/15/2020 at 9:03 pm to provider [facility] , who verbally acknowledged these results. Electronically Signed   By: Rolm Baptise M.D.   On: 10/15/2020 21:05    Procedures Procedures   Medications Ordered in ED Medications - No data to display  ED Course  I have reviewed the triage vital signs and the nursing notes.  Pertinent labs & imaging results that were available during my care of the patient were reviewed by me and considered in my medical decision making (see chart for details).    MDM Rules/Calculators/A&P                          Patient has acute left-sided weakness and cannot walk without significant difficulty.  He has already fallen at home.  Likely is due to the metastatic lesion seen.  Given this, he will need MRI with and without contrast.  He does seem to have a prior history of  cancer being treated by rad onc in the past.  However, it seems like he is not being treated currently.  Will order CT chest/abdomen/pelvis given acute cough as well as  abdominal pain.  He will need admission.  I tried to call his sister at the patient's request but she did not answer the phone.  Discussed with [name] for admission. Final Clinical Impression(s) / ED Diagnoses Final diagnoses:  Metastatic cancer to brain [facility])    Rx / DC Orders ED Discharge Orders     None        Sherwood Gambler, MD 10/16/20 [phone]

## 2020-10-16 NOTE — Progress Notes (Signed)
Radiation Oncology         [phone] ________________________________  In-Patient Re-Consultation Note  Name: Benjamin Willis. 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 is a 75 y.o. 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  The patient is well-known to radiation oncology.  He was initially diagnosed with a clinical stage I adenocarcinoma of the right upper lobe.  Diagnosis was confirmed on cytology.  The patient proceeded to undergo SBRT treatments.  At a later date the patient recurred in the same general area.  PET scan showed 3 PET positive lesions the same general area.  The patient did not wish to proceed with additional bronchoscopy to confirm recurrence.  Options for management were discussed with the patient including medical oncology consultation for chemotherapy/immunotherapy or proceeding with conventional radiation therapy over approximately 6 weeks.  Patient elected to proceed with radiation therapy and completed this radiation treatment in December 2021. patient recently presented with difficulties with walking and generalized weakness. He underwent a head CT scan which showed multiple apparent cystic lesions within the brain demonstrated surrounding vasogenic edema concerning for metastatic disease.  A subsequent brain MRI was performed which showed for brain lesions consistent with metastasis.  There was extensive vasogenic edema associated with the posterior right frontal lesion.  No significant midline shift noted.  The largest lesion was in the posterior right frontal area measuring 3.1 x 3.3 cm.  Patient subsequently admitted for management of this  new finding.  Patient was placed on steroids earlier today and according to patient's sister as well as the patient he is doing much better.  Radiation oncology has been consulted for consideration for additional treatment.  CT scans of the chest abdomen and pelvis earlier today showed radiation changes in the right lung but no suspicious findings of recurrence.  There is no metastatic disease in the abdomen or pelvis.  PREVIOUS RADIATION THERAPY: Yes   Cancer Staging: Clinical Stage IA2  (cT1b, N0) non-small cell lung cancer (adenocarcinoma)   Intent: Curative  Radiation Treatment Dates: 02/21/2019 through 02/28/2019 Site Technique Total Dose (Gy) Dose per Fx (Gy) Completed Fx Beam Energies  [place]: Lung_Rt SBRT 54/54 18 3/3 6XFFF   Patient later recurred in the right lung and underwent conventional fractionated radiation therapy over 6 and half weeks.  Radiation Treatment Dates: 02/29/2020 through 04/16/2020 Site Technique Total Dose (Gy) Dose per Fx (Gy) Completed Fx Beam Energies  Lung, Right: Lung_Rt IMRT 64/64 2 32/32 6X    PAST MEDICAL HISTORY:  has a past medical history of Allergy, Diabetes mellitus without complication ([place]), Glaucoma (10/16/2020), Hyperlipidemia, Hypertension, and Lung cancer ([place]).    PAST SURGICAL HISTORY: Past Surgical History:  Procedure Laterality Date   ELECTROMAGNETIC NAVIGATION BROCHOSCOPY Right 02/02/2019   Procedure: VIDEO BRONCHOSCOPY WITH NAVIGATION AND FIDUCIAL PLACEMENT;  Surgeon: Garner Nash, DO;  Location: [place];  Service: Cardiopulmonary;  Laterality: Right;   FUDUCIAL PLACEMENT Right 02/02/2019   Procedure: Placement Of Fuducial Right Upper Lobe;  Surgeon: Garner Nash, DO;  Location: [place];  Service: Cardiopulmonary;  Laterality: Right;   none     VIDEO BRONCHOSCOPY WITH RADIAL ENDOBRONCHIAL ULTRASOUND Right  02/02/2019   Procedure: Video Bronchoscopy With Radial Endobronchial Ultrasound;  Surgeon: Garner Nash, DO;  Location: [place];  Service: Cardiopulmonary;  Laterality: Right;    FAMILY HISTORY: family history is not on file.  SOCIAL HISTORY:  reports that he quit smoking about 5 years ago. His smoking use included cigarettes. He has a 48.00 pack-year smoking history. He quit smokeless tobacco use about 7 years ago. He reports that he does not drink alcohol and does not use drugs.  ALLERGIES: Patient has no known allergies.  MEDICATIONS:  No current facility-administered medications for this encounter.   No current outpatient medications on file.   Facility-Administered Medications Ordered in Other Encounters  Medication Dose Route Frequency Provider Last Rate Last Admin   acetaminophen (TYLENOL) tablet 650 mg  650 mg Oral Q6H PRN Karmen Bongo, MD       Or   acetaminophen (TYLENOL) suppository 650 mg  650 mg Rectal Q6H PRN Karmen Bongo, MD       albuterol (PROVENTIL) (2.5 MG/3ML) 0.083% nebulizer solution 2.5 mg  2.5 mg Nebulization Q6H PRN Cala Bradford, RPH       allopurinol (ZYLOPRIM) tablet 100 mg  100 mg Oral Daily Karmen Bongo, MD   100 mg at 10/16/20 1200   amLODipine (NORVASC) tablet 10 mg  10 mg Oral Daily Karmen Bongo, MD   10 mg at 10/16/20 1201   bisacodyl (DULCOLAX) EC tablet 5 mg  5 mg Oral Daily PRN Karmen Bongo, MD       dexamethasone (DECADRON) injection 4 mg  4 mg Intravenous Guillermina City, MD   4 mg at 10/16/20 1202   docusate sodium (COLACE) capsule 100 mg  100 mg Oral BID Karmen Bongo, MD   100 mg at 10/16/20 1209   hydrALAZINE (APRESOLINE) injection 5 mg  5 mg Intravenous Q4H PRN Karmen Bongo, MD       HYDROcodone-acetaminophen (NORCO/VICODIN) 5-325 MG per tablet 1-2 tablet  1-2 tablet Oral Q4H PRN Karmen Bongo, MD       insulin aspart (novoLOG) injection 0-Willis Units  0-Willis Units Subcutaneous TID WC Karmen Bongo, MD       latanoprost (XALATAN) 0.005 % ophthalmic solution 1 drop  1 drop Both Eyes QHS Karmen Bongo, MD       lisinopril (ZESTRIL) tablet 40  mg  40 mg Oral Daily Karmen Bongo, MD   40 mg at 10/16/20 1200   morphine 2 MG/ML injection 2 mg  2 mg Intravenous Q2H PRN Karmen Bongo, MD       ondansetron [facility]) tablet 4 mg  4 mg Oral Q6H PRN Karmen Bongo, MD       Or   ondansetron [facility]) injection 4 mg  4 mg Intravenous Q6H PRN Karmen Bongo, MD       polyethylene glycol (MIRALAX / GLYCOLAX) packet 17 g  17 g Oral Daily PRN Karmen Bongo, MD       rosuvastatin (CRESTOR) tablet 20 mg  20 mg Oral QHS Karmen Bongo, MD        REVIEW OF SYSTEMS:  REVIEW OF SYSTEMS: A 10+ POINT REVIEW OF SYSTEMS WAS OBTAINED including neurology, dermatology, psychiatry, cardiac, respiratory, lymph, extremities, GI, GU, musculoskeletal, constitutional, reproductive, HEENT. All pertinent positives are noted in the HPI. All others are negative.  Patient denies any nausea or headaches this evening.  He denies any visual problems.  The sister and patient report being unable to walk for approximately a week due to his generalized weakness.  He does have chronic weakness in his left lower extremity    PHYSICAL EXAM: Patient is alert and oriented in his usual very pleasant self.  He responds appropriately to questions.  His mental capacities have been limited prior to this new problem.  He is accompanied by his sister on evaluation this evening. Lungs are clear to auscultation bilaterally. Heart has regular rate and rhythm. No palpable cervical, supraclavicular, or axillary adenopathy. Abdomen soft, non-tender, normal bowel sounds.  Some weakness noted in the left lower extremity which has been chronic.   ECOG = 3  0 - Asymptomatic (Fully active, able to carry on all predisease activities without restriction)  1 - Symptomatic but completely ambulatory (Restricted in physically strenuous activity but ambulatory and able to carry out work of a light or sedentary nature. For example, light housework, office work)  2 - Symptomatic, <50% in bed during the  day (Ambulatory and capable of all self care but unable to carry out any work activities. Up and about more than 50% of waking hours)  3 - Symptomatic, >50% in bed, but not bedbound (Capable of only limited self-care, confined to bed or chair 50% or more of waking hours)  4 - Bedbound (Completely disabled. Cannot carry on any self-care. Totally confined to bed or chair)  5 - Death   Eustace Pen MM, Creech RH, Tormey DC, et al. [phone]). "Toxicity and response criteria of the [facility]". [place] Oncol. 5 (6): 649-55  LABORATORY DATA:  Lab Results  Component Value Date   WBC 4.6 10/15/2020   HGB 13.0 10/15/2020   HCT 41.1 10/15/2020   MCV 96.0 10/15/2020   PLT 245 10/15/2020   NEUTROABS 2.9 10/15/2020   Lab Results  Component Value Date   NA 140 10/15/2020   K 4.1 10/15/2020   CL 106 10/15/2020   CO2 25 10/15/2020   GLUCOSE 118 (H) 10/15/2020   CREATININE 0.72 10/15/2020   CALCIUM 9.2 10/15/2020      RADIOGRAPHY: DG Chest 2 View  Result Date: 10/15/2020 CLINICAL DATA:  75 year old male with weakness. EXAM: CHEST - 2 VIEW COMPARISON:  Chest radiograph dated 10/11/2020. FINDINGS: Shallow inspiration. Focal area of rounded opacity with several associated fiducial markers as seen on the prior radiograph. No new consolidation. There is no pleural effusion or pneumothorax. Mild cardiomegaly. No acute osseous pathology. IMPRESSION: 1. No acute cardiopulmonary process. No interval change in the appearance of the rounded right upper lobe density. 2. Mild cardiomegaly. Electronically Signed   By: Anner Crete M.D.   On: 10/15/2020 20:41   DG Chest 2 View  Result Date: 10/11/2020 CLINICAL DATA:  Cough and dyspnea today. Non-small cell right lung cancer. EXAM: CHEST - 2 VIEW COMPARISON:  02/02/2019 chest radiograph.  01/25/2020 chest CT. FINDINGS: Stable cardiomediastinal silhouette with normal heart size. No pneumothorax. No pleural effusion. Three clustered fiducial  markers in the upper right lung with surrounding patchy ill-defined opacity, which appears mildly increased from prior chest CT. Clear left lung. Nipple shadows overlie the lower lungs. IMPRESSION: Patchy ill-defined upper right lung opacity surrounding clustered fiducial markers, mildly increased from prior chest CT, nonspecific, differential including post treatment change, recurrent tumor or pneumonia. Chest CT suggested for further evaluation. Electronically Signed   By: Ilona Sorrel M.D.   On: 10/11/2020 14:14   CT Head Wo Contrast  Result Date: 10/15/2020 CLINICAL DATA:  Weakness EXAM: CT HEAD WITHOUT CONTRAST TECHNIQUE: Contiguous axial images were obtained from the base of the  skull through the vertex without intravenous contrast. COMPARISON:  None. FINDINGS: Brain: Cystic mass is seen in the right parietal lobe measuring 3.3 cm with surrounding extensive vasogenic edema. Probable cystic mass in the left occipital lobe measuring 2 cm with surrounding edema. Possible additional lesion in the right occipital lobe and right cerebellar hemisphere. Findings suspicious for metastatic disease. No hemorrhage. No midline shift or hydrocephalus. Vascular: No hyperdense vessel or unexpected calcification. Skull: No acute calvarial abnormality. Sinuses/Orbits: No acute findings Other: None IMPRESSION: Multiple apparent cystic lesions within the brain with surrounding vasogenic edema concerning for metastatic disease. Recommend further evaluation with MRI. These results were called by telephone at the time of interpretation on 10/15/2020 at 9:03 pm to provider [facility] , who verbally acknowledged these results. Electronically Signed   By: Rolm Baptise M.D.   On: 10/15/2020 21:05   MR Brain W and Wo Contrast  Result Date: 10/16/2020 CLINICAL DATA:  Left greater than right-sided weakness. Multiple brain lesions on CT. History of lung cancer. EXAM: MRI HEAD WITHOUT AND WITH CONTRAST TECHNIQUE: Multiplanar,  multiecho pulse sequences of the brain and surrounding structures were obtained without and with intravenous contrast. CONTRAST:  7.18mL GADAVIST GADOBUTROL 1 MMOL/ML IV SOLN COMPARISON:  Head CT 10/15/2020 FINDINGS: Brain: Partially cystic lesions with irregular peripheral enhancement measured 2.2 x 1.7 cm in the posteromedial right cerebellar hemisphere (series 10, image 16), 2.6 x 2.1 cm in the left occipital lobe (series 10, image 27), 2.4 x 2.0 cm in the medial right parietal lobe (series 10, image 32), and 3.3 x 3.1 cm in the posterior right frontal lobe (series 10, image 40). There is a small amount of chronic blood products associated with the lesions. There is extensive vasogenic edema associated with the posterior right frontal lesion and milder edema associated with the other cerebral and cerebellar lesions. There is no midline shift. Scattered small foci of T2 hyperintensity in the cerebral white matter and pons are nonspecific but compatible with mild chronic small vessel ischemic disease. No acute infarct or extra-axial fluid collection is evident. Cerebral volume is within normal limits for age. Vascular: Major intracranial vascular flow voids are preserved. Skull and upper cervical spine: Unremarkable bone marrow signal. Sinuses/Orbits: Mild depression of the right lamina papyracea which may reflect a remote orbital fracture. Paranasal sinuses and mastoid air cells are clear. Other: None. IMPRESSION: 1. Four brain lesions consistent with metastases. Extensive vasogenic edema associated with the posterior right frontal lesion without midline shift. 2. Mild chronic small vessel ischemic disease. Electronically Signed   By: Logan Bores M.D.   On: 10/16/2020 09:25   CT CHEST ABDOMEN PELVIS W CONTRAST  Result Date: 10/16/2020 CLINICAL DATA:  Brain metastases, staging, history of lung cancer EXAM: CT CHEST, ABDOMEN, AND PELVIS WITH CONTRAST TECHNIQUE: Multidetector CT imaging of the chest, abdomen and  pelvis was performed following the standard protocol during bolus administration of intravenous contrast. CONTRAST:  148mL OMNIPAQUE IOHEXOL 300 MG/ML  SOLN COMPARISON:  02/13/2020 FINDINGS: CT CHEST FINDINGS Cardiovascular: Aortic atherosclerosis. Normal heart size. No pericardial effusion. Mediastinum/Nodes: No enlarged mediastinal, hilar, or axillary lymph nodes. Thyroid gland, trachea, and esophagus demonstrate no significant findings. Lungs/Pleura: Expected interval evolution of radiation fibrosis in the perihilar and suprahilar right lung. A nodular component in the central right upper lobe is minimally decreased in size although more difficult to appreciate against a background of developing fibrosis, measuring approximately 1.4 x 1.3 cm, previously 1.5 x 1.5 cm (series 5, image 51). No pleural effusion  or pneumothorax. Musculoskeletal: No chest wall mass or suspicious bone lesions identified. CT ABDOMEN PELVIS FINDINGS Hepatobiliary: No solid liver abnormality is seen. No gallstones, gallbladder wall thickening, or biliary dilatation. Pancreas: Unremarkable. No pancreatic ductal dilatation or surrounding inflammatory changes. Spleen: Normal in size without significant abnormality. Adrenals/Urinary Tract: Stable small right adrenal adenoma, measuring 1.3 x 0.9 cm (series 3, image 61). Kidneys are normal, without renal calculi, solid lesion, or hydronephrosis. Bladder is unremarkable. Stomach/Bowel: Stomach is within normal limits. Appendix appears normal. No evidence of bowel wall thickening, distention, or inflammatory changes. Vascular/Lymphatic: Aortic atherosclerosis. No enlarged abdominal or pelvic lymph nodes. Reproductive: No mass or other abnormality. Other: Small, fat containing bilateral inguinal hernias. No abdominopelvic ascites. Musculoskeletal: No acute or significant osseous findings. IMPRESSION: 1. Expected interval evolution of radiation fibrosis in the perihilar and suprahilar right lung. A  nodular component in the central right upper lobe is minimally decreased in size although more difficult to appreciate against a background of developing fibrosis. Other previously described nodules are not well appreciated. Findings are consistent with treatment response and expected post treatment appearance. 2. No evidence of lymphadenopathy or metastatic disease in the chest, abdomen or pelvis. Aortic Atherosclerosis (ICD10-I70.0). Electronically Signed   By: Eddie Candle M.D.   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  Earlier today understood spoke with Dr. Lisbeth Renshaw  about consideration for possible SRS treatment.  Given the location of the lesions and one general plane the patient would require fractionated radiation therapy likely over 5 fractions.  This treatment will also be delayed for several days as the patient would need a 3T MRI for planning for SRS treatments.  Also discussed that the patient would be a candidate for whole brain radiation therapy which would extend over approximately 2 weeks.  He could proceed with simulation and his first treatment tomorrow with this therapy.  I discussed the pros and cons of each treatment in detail.  We also talked that if the patient proceeds with whole brain radiation therapy and he has residual disease in these 4 areas noted that he would be a potential candidate for SRS boost at a later date.  After careful consideration the patient and his sister would like to be proceed with whole brain radiation therapy.  I  discussed this treatment in detail associated side effects and potential toxicities including but not limited to dementia memory loss and confusion.  The patient and his sister understand that this  will not be curative treatment.  Once the patient is stabilized sister has discussed that he will live with she and her husband from here on out.  She did not feel that he is capable of living by himself in his apartment.  The sister is agreeable to transport the patient as an outpatient once he is stable for discharge.  I suggested that we keep the patient in the hospital for 1 to 2 days until he is medically stable and see how he does with treatment and discharge and receive remaining treatments as an outpatient.  PLAN: Simulation and first whole brain radiation treatment tomorrow as an inpatient.  Anticipate 10 radiation treatments directed at the whole brain.  Simulation scheduled for 8 AM Wednesday morning with first treatment  sometime later in the day.  I spent 60 minutes minutes face to face with the patient and more than 50% of that time was spent in counseling and/or coordination of care.   ------------------------------------------------  Blair Promise, PhD, MD

## 2020-10-16 NOTE — ED Notes (Signed)
Onc nurse at [facility]

## 2020-10-17 ENCOUNTER — Ambulatory Visit
Admit: 2020-10-17 | Discharge: 2020-10-17 | Disposition: A | Discharge status: Home / Self Care | Payer: Medicare Other | Source: Ambulatory Visit | Attending: Radiation Oncology | Admitting: Radiation Oncology

## 2020-10-17 ENCOUNTER — Ambulatory Visit
Admit: 2020-10-17 | Discharge: 2020-10-17 | Disposition: A | Discharge status: Home / Self Care | Payer: Medicare Other | Attending: Radiation Oncology | Admitting: Radiation Oncology

## 2020-10-17 DIAGNOSIS — C3411 Malignant neoplasm of upper lobe, right bronchus or lung: Secondary | ICD-10-CM | POA: Insufficient documentation

## 2020-10-17 DIAGNOSIS — C3490 Malignant neoplasm of unspecified part of unspecified bronchus or lung: Secondary | ICD-10-CM

## 2020-10-17 DIAGNOSIS — Z510 Encounter for antineoplastic radiation therapy: Secondary | ICD-10-CM | POA: Insufficient documentation

## 2020-10-17 DIAGNOSIS — C7931 Secondary malignant neoplasm of brain: Secondary | ICD-10-CM | POA: Insufficient documentation

## 2020-10-17 LAB — BASIC METABOLIC PANEL
Anion gap: 9 (ref 5–15)
BUN: 20 mg/dL (ref 8–23)
CO2: 24 mmol/L (ref 22–32)
Calcium: 9.2 mg/dL (ref 8.9–10.3)
Chloride: 105 mmol/L (ref 98–111)
Creatinine, Ser: 0.92 mg/dL (ref 0.61–1.24)
GFR, Estimated: >60 mL/min (ref >60)
Glucose, Bld: 185 mg/dL — ABNORMAL HIGH (ref 70–99)
Potassium: 4.8 mmol/L (ref 3.5–5.1)
Sodium: 138 mmol/L (ref 135–145)

## 2020-10-17 LAB — CBC
HCT: 38.6 % — ABNORMAL LOW (ref 39.0–52.0)
Hemoglobin: 12.6 g/dL — ABNORMAL LOW (ref 13.0–17.0)
MCH: 31 pg (ref 26.0–34.0)
MCHC: 32.6 g/dL (ref 30.0–36.0)
MCV: 94.8 fL (ref 80.0–100.0)
Platelets: 249 10*3/uL (ref 150–400)
RBC: 4.07 MIL/uL — ABNORMAL LOW (ref 4.22–5.81)
RDW: 12.1 % (ref 11.5–15.5)
WBC: 6.9 10*3/uL (ref 4.0–10.5)
nRBC: 0 % (ref 0.0–0.2)

## 2020-10-17 LAB — GLUCOSE, CAPILLARY
Glucose-Capillary: 177 mg/dL — ABNORMAL HIGH (ref 70–99)
Glucose-Capillary: 204 mg/dL — ABNORMAL HIGH (ref 70–99)
Glucose-Capillary: 172 mg/dL — ABNORMAL HIGH (ref 70–99)
Glucose-Capillary: 275 mg/dL — ABNORMAL HIGH (ref 70–99)

## 2020-10-17 LAB — PHOSPHORUS: Phosphorus: 3.8 mg/dL (ref 2.5–4.6)

## 2020-10-17 MED ORDER — LEVETIRACETAM IN NACL 1000 MG/100ML IV SOLN
1000.0000 mg | Freq: Two times a day (BID) | INTRAVENOUS | Status: DC
  Administered 2020-10-17 – 2020-10-23 (×12): 1000 mg via INTRAVENOUS
  Filled 2020-10-17 (×13): qty 100

## 2020-10-17 MED ORDER — LORAZEPAM 2 MG/ML IJ SOLN: 2.0000 mg | INTRAMUSCULAR | Status: DC | PRN

## 2020-10-17 NOTE — Progress Notes (Signed)
Initial Nutrition Assessment  INTERVENTION:   -Ensure Enlive po daily, each supplement provides 350 kcal and 20 grams of protein  -Placed lunch order for per patient request  NUTRITION DIAGNOSIS:   Increased nutrient needs related to cancer and cancer related treatments as evidenced by estimated needs.  GOAL:   Patient will meet greater than or equal to 90% of their needs  MONITOR:   PO intake, Supplement acceptance, Labs, Weight trends, I & O's  REASON FOR ASSESSMENT:   Consult Assessment of nutrition requirement/status  ASSESSMENT:   75 year old male with a past medical history significant for diabetes mellitus, hypertension, hyperlipidemia, Stage IA2 (cT1b, N0) non-small cell lung cancer (adenocarcinoma) status post SBRT to the right lung 02/21/2019 through 02/28/2019 followed by additional radiation to 3 lung nodules which were recurrent disease 02/29/2020 through 04/16/2020. Pt with new brain mets.  Patient in room, pt in good spirits. Pt reports always having a good appetite and eats well. This was confirmed by the RN. Pt ate 100% of breakfast and dinner last night. Pt requested RD placed lunch order of specific items. RD placed order.  Pt unable to answer if he has drank Ensure or Boost before. Per chart review, plan is for pt to undergo radiation treatment for new brain mets.   Per weight records, pt has lost 10 lbs since 5/2 (5% wt loss x 1.5 months, insignificant for time frame).  Medications: Colace  Labs reviewed:  CBGs: 148-235  NUTRITION - FOCUSED PHYSICAL EXAM:  No depletions noted.  Diet Order:   Diet Order             Diet Carb Modified Fluid consistency: Thin; Room service appropriate? Yes  Diet effective now                   EDUCATION NEEDS:   No education needs have been identified at this time  Skin:  Skin Assessment: Reviewed RN Assessment  Last BM:  6/14  Height:   Ht Readings from Last 1 Encounters:  10/11/20 5\' 6"  (1.676 m)     Weight:   Wt Readings from Last 1 Encounters:  10/16/20 72.4 kg    BMI:  Body mass index is 25.76 kg/m.  Estimated Nutritional Needs:   Kcal:  2100-2300  Protein:  100-110g  Fluid:  2.1L/day   Clayton Bibles, MS, RD, LDN Inpatient Clinical Dietitian Contact information available via Amion

## 2020-10-17 NOTE — Consult Note (Signed)
Consultation Note Date: 10/17/2020   Patient Name: Benjamin Willis.  DOB: 21-May-1945  MRN: [id number]  Age / Sex: 75 y.o., male  PCP: Benjamin Pollen, MD Referring Physician: Elodia Florence., *  Reason for Consultation: Establishing goals of care  HPI/Patient Profile: 75 y.o. 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   Patient is s/p SBRT on right lung and radiation to additional lung nodules. 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 MRI now shows four brain lesions and extensive vasogenic edema. Palliative care has been consulted to assist with goals of care discussion.  Clinical Assessment and Goals of Care:  I have reviewed medical records including EPIC notes, labs and imaging, assessed the patient, met at the bedside and then called patient's sister Benjamin Willis to discuss diagnosis, prognosis, GOC, EOL wishes, disposition and options.  I introduced Palliative Medicine as specialized medical care for people living with serious illness. It focuses on providing relief from the symptoms and stress of a serious illness. The goal is to improve quality of life for both the patient and the family.  We discussed a brief life review of the patient and then focused on their current illness. Benjamin Willis is from the [place], [place] area and has moved to [place], [place] to be closer to his sister after living in [place] for some time. He is divorced with no children. 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 He also enjoys dancing, listening to music, participating in the church choir, and attending church reunions and picnics. He understands that his lung cancer  has spread to his brain and states "that is scary." He understands that he is undergoing radiation therapy to improve his symptoms and that he will need to live with his sister upon discharge for continued treatments and therapies. He is in good spirits and looks forward to seeing members of his community again.  I then called his sister Benjamin Willis to provide support. She is appreciative of the palliative care consult and states that she is in great need of assistance with coordination of care. Her goal is to ensure Benjamin Willis has all the resources he needs while he is living with her. For example, he previously had assistance with Benjamin Willis drivers for transportation to radiation oncology. Offered to discuss with CSW and also informed of the availability of palliative care for ongoing goals of care discussions and symptom management. She again voices appreciation and agreement with an outpatient palliative care referral. Benjamin Willis acknowledges that Benjamin Willis will likely prefer to move back into his apartment, but she also acknowledges that this may not be feasible. She is willing to care for him and work with members of the care team for the remainder of his life.  I attempted to elicit values and goals  of care important to the patient.    Hospice and Palliative Care services outpatient were explained and offered.   Discussed the importance of continued conversation with family and the medical providers regarding overall plan of care and treatment options, ensuring decisions are within the context of the patient's values and GOCs.    Questions and concerns were addressed. The family was encouraged to call with questions or concerns.  PMT will continue to support holistically.      SUMMARY OF RECOMMENDATIONS   -Continue full code/full scope treatment  -Patient and his sister's goal are for his discharge to her home with home health, transportation assistance, and palliative care referral -Consult TOC, assistance is  appreciated -Psychosocial and emotional support provided -PMT will provide ongoing support  Code Status/Advance Care Planning: Full code  Palliative Prophylaxis:  Delirium Protocol  Additional Recommendations (Limitations, Scope, Preferences): Full Scope Treatment  Psycho-social/Spiritual:  Desire for further Chaplaincy support:supported by his pastor Additional Recommendations: Caregiving  Support/Resources, Education on Hospice, and Referral to [employer]   Prognosis:  Poor prognosis  Discharge Planning: Home with Palliative Services      Primary Diagnoses: Present on Admission:  Metastatic lung cancer (metastasis from lung to other site) [facility])  Dyslipidemia associated with type 2 diabetes mellitus ([place])  Hypertension associated with type 2 diabetes mellitus ([place])  Glaucoma   I have reviewed the medical record, interviewed the patient and family, and examined the patient. The following aspects are pertinent.  Past Medical History:  Diagnosis Date   Allergy    Diabetes mellitus without complication ([place])    Type II   Glaucoma 10/16/2020   Hyperlipidemia    Hypertension    Lung cancer (HCC)    Stage IA2 (cT1b, N0) non-small cell lung cancer (adenocarcinoma)   Social History   Socioeconomic History   Marital status: Single    Spouse name: Not on file   Number of children: 0   Years of education: Not on file   Highest education level: 9th grade  Occupational History   Not on file  Tobacco Use   Smoking status: Former    Packs/day: 1.00    Years: 48.00    Pack years: 48.00    Types: Cigarettes    Quit date: 02/20/2015    Years since quitting: 5.6   Smokeless tobacco: Former    Quit date: 10/2013  Vaping Use   Vaping Use: Never used  Substance and Sexual Activity   Alcohol use: No   Drug use: No   Sexual activity: Yes    Birth control/protection: Condom  Other Topics Concern   Not on file  Social History Narrative   Not on file   Social  Determinants of Health   Financial Resource Strain: Not on file  Food Insecurity: Not on file  Transportation Needs: Not on file  Physical Activity: Not on file  Stress: Not on file  Social Connections: Not on file   Family History  Problem Relation Age of Onset   Lung cancer Neg Hx    Scheduled Meds:  allopurinol  100 mg Oral Daily   amLODipine  10 mg Oral Daily   dexamethasone (DECADRON) injection  4 mg Intravenous Q6H   docusate sodium  100 mg Oral BID   insulin aspart  0-15 Units Subcutaneous TID WC   latanoprost  1 drop Both Eyes QHS   lisinopril  40 mg Oral Daily   rosuvastatin  20 mg Oral QHS   Continuous Infusions: PRN  Meds:.acetaminophen **OR** acetaminophen, albuterol, bisacodyl, hydrALAZINE, HYDROcodone-acetaminophen, morphine injection, ondansetron **OR** ondansetron (ZOFRAN) IV, polyethylene glycol Medications Prior to Admission:  Prior to Admission medications   Medication Sig Start Date End Date Taking? Authorizing Provider  albuterol (VENTOLIN HFA) 108 (90 Base) MCG/ACT inhaler Inhale 2 puffs into the lungs every 6 (six) hours as needed for wheezing or shortness of breath. 01/19/19  Yes Icard, Octavio Graves, DO  allopurinol (ZYLOPRIM) 100 MG tablet Take 100 mg by mouth daily. 07/31/20  Yes [provider]  amLODipine (NORVASC) 10 MG tablet Take 1 tablet (10 mg total) by mouth daily. 12/06/19  Yes Sagardia, Ines Bloomer, MD  blood glucose meter kit and supplies Per insurance preference. Check blood glucose once a day. Dx E11.9 04/18/19  Yes Benjamin Pollen, MD  Blood Pressure Monitoring (BLOOD PRESSURE MONITOR/ARM) DEVI Check BP once a day. DX I 10 04/18/19  Yes Sagardia, Ines Bloomer, MD  cetirizine (ZYRTEC) 10 MG tablet Take 1 tablet (10 mg total) by mouth daily. Patient taking differently: Take 10 mg by mouth daily as needed for allergies. 09/03/17  Yes Jaynee Eagles, PA-C  furosemide (LASIX) 20 MG tablet Take 20 mg by mouth daily. 08/20/20  Yes [provider]  glipiZIDE (GLUCOTROL) 5 MG tablet TAKE 1 TABLET BY MOUTH TWICE DAILY WITH A MEAL Patient taking differently: Take 5 mg by mouth 2 (two) times daily before a meal. 10/08/20  Yes Sagardia, Ines Bloomer, MD  latanoprost (XALATAN) 0.005 % ophthalmic solution Place 1 drop into both eyes at bedtime. 01/24/20  Yes [provider]  lisinopril (ZESTRIL) 40 MG tablet Take 1 tablet (40 mg total) by mouth daily. 12/06/19 10/16/20 Yes Sagardia, Ines Bloomer, MD  metFORMIN (GLUCOPHAGE) 500 MG tablet Take 1 tablet (500 mg total) by mouth 2 (two) times daily with a meal. Take 1 tablet by mouth once daily with breakfast 03/01/20 10/16/20 Yes Sagardia, Ines Bloomer, MD  Multiple Vitamins-Minerals (MULTIVITAMIN MEN) TABS Take 1 tablet by mouth daily.   Yes [provider]  rosuvastatin (CRESTOR) 20 MG tablet Take 20 mg by mouth at bedtime. 10/27/19  Yes [provider]  sitaGLIPtin (JANUVIA) 100 MG tablet Take 1 tablet (100 mg total) by mouth daily. 05/31/20 10/16/20 Yes Sagardia, Ines Bloomer, MD   No Known Allergies Review of Systems  All other systems reviewed and are negative.  Physical Exam Vitals and nursing note reviewed.  Constitutional:      General: He is not in acute distress. Cardiovascular:     Rate and Rhythm: Normal rate.  Pulmonary:     Effort: Pulmonary effort is normal.  Neurological:     Mental Status: He is alert. Mental status is at baseline.  Psychiatric:        Mood and Affect: Mood normal.        Behavior: Behavior is cooperative.    Vital Signs: BP (!) 157/79   Pulse 63   Temp 98.5 F (36.9 C) (Oral)   Resp 17   Wt 72.4 kg   SpO2 98%   BMI 25.76 kg/m  Pain Scale: 0-10   Pain Score: 0-No pain   SpO2: SpO2: 98 % O2 Device:SpO2: 98 % O2 Flow Rate: .   IO: Intake/output summary:  Intake/Output Summary (Last 24 hours) at 10/17/2020 1234 Last data filed at 10/17/2020 2620 Gross per 24 hour  Intake 240 ml  Output 300 ml  Net -60 ml     LBM: Last BM Date: 10/16/20 Baseline Weight: Weight: 72.4 kg  Most recent weight: Weight: 72.4 kg     Palliative Assessment/Data: 50%    Time In:  12:30pm Time Out: 1:40pm Time Total: 70 minutes Greater than 50% of this time was spent in counseling and coordinating care related to the above assessment and plan.  Dorthy Cooler, PA-C Palliative Medicine Team Team phone # [phone]  Thank you for allowing the Palliative Medicine Team to assist in the care of this patient. Please utilize secure chat with additional questions, if there is no response within 30 minutes please call the above phone number.  Palliative Medicine Team providers are available by phone from 7am to 7pm daily and can be reached through the team cell phone.  Should this patient require assistance outside of these hours, please call the patient's attending physician.

## 2020-10-17 NOTE — Evaluation (Signed)
Physical Therapy Evaluation Patient Details Name: Benjamin Willis. MRN: [id number] DOB: Apr 14, 1946 Today's Date: 10/17/2020   History of Present Illness  Patient is a 75 year old male presenting with generalized weakness and a fall. 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 Patient starting brain radiation 6/15. PMH includes DM, HTN, HLD  Clinical Impression  Pt admitted with above diagnosis. Pt reports independent at baseline without AD, sudden weakness onset. Pt previously worked with OT, able to recall date appropriately and now saying he plans to go to sisters house at d/c, also appears to be following commands better and more aware with mobility. Pt requires assist to place L hand on RW upright with 3 missed attempts prior. Pt with slower initiation and decreased strength on LLE. Pt ambulates 40 ft with RW and min guard, with slow step to pattern and L limp noted that pt states is his normal due to LLE being shorter since he was born. Recommending HHPT and 24 hr assist at sister's house unless family unable to provide this level of assist, then may need to consider SNF. Pt currently with functional limitations due to the deficits listed below (see PT Problem List). Pt will benefit from skilled PT to increase their independence and safety with mobility to allow discharge to the venue listed below.       Follow Up Recommendations Home health PT;Supervision/Assistance - 24 hour    Equipment Recommendations  None recommended by PT    Recommendations for Other Services       Precautions / Restrictions Precautions Precautions: Fall Restrictions Weight Bearing Restrictions: No      Mobility  Bed Mobility Overal bed mobility: Needs Assistance Bed Mobility: Supine to Sit  Supine to sit: Min assist   General bed mobility comments: min A to assist trunk into upright sitting and scoot out to EOB    Transfers Overall transfer level: Needs  assistance Equipment used: Rolling walker (2 wheeled) Transfers: Sit to/from Stand Sit to Stand: Min guard   General transfer comment: min guard to power to stand, VCs for hand placement and requires physical assist to place L hand on RW upright due to missing upright 3x  Ambulation/Gait Ambulation/Gait assistance: Min guard Gait Distance (Feet): 40 Feet Assistive device: Rolling walker (2 wheeled) Gait Pattern/deviations: Step-to pattern;Decreased stance time - left;Decreased stride length;Trendelenburg Gait velocity: decreased   General Gait Details: L hip drop with LE external rotation that pt calls his "limp", trunk slightly flexed with slow step to steps using RW, able to maneuver RW and complete turns without LOB, min guard for steadying and safety  Stairs            Wheelchair Mobility    Modified Rankin (Stroke Patients Only)       Balance Overall balance assessment: History of Falls;Needs assistance Sitting-balance support: Feet supported Sitting balance-Leahy Scale: Good Sitting balance - Comments: seated EOB   Standing balance support: Bilateral upper extremity supported;During functional activity Standing balance-Leahy Scale: Poor Standing balance comment: reliant on UE support       Pertinent Vitals/Pain Pain Assessment: No/denies pain    Home Living Family/patient expects to be discharged to:: Private residence Living Arrangements: Other relatives (sister and brother in law) Available Help at Discharge: Family Type of Home: House Home Access: Stairs to enter   Technical brewer of Steps: 2-3 Home Layout: Two level;Able to live on main level with bedroom/bathroom Home Equipment: Kasandra Knudsen - single point;Walker - 2  wheels;Bedside commode;Grab bars - tub/shower Additional Comments: Per chart review sister plans to bring patient home with her and her husband. Family not present at time of eval, pt reports he is in agreement to go stay with his sister  while he regains his strength and independence.    Prior Function Level of Independence: Independent         Comments: per chart review patient was cutting his lawn about a week ago before weakness progressed     Hand Dominance   Dominant Hand: Right    Extremity/Trunk Assessment   Upper Extremity Assessment Upper Extremity Assessment: Defer to OT evaluation LUE Deficits / Details: note mild ROM deficits in shoulder pivots compared to R shoulder, decreased strength 4-/5 compared to 4+/5 on R side, note a moderate dysmetria on L LUE Coordination: decreased fine motor;decreased gross motor    Lower Extremity Assessment Lower Extremity Assessment: LLE deficits/detail;RLE deficits/detail RLE Deficits / Details: AROM WNL, strength 4+/5 throughout, denies numbness/tingling RLE Sensation: WNL LLE Deficits / Details: AROM WNL, strength 4-/5, slower initiation compared to R side, denies numbness/tingling LLE Sensation: WNL    Cervical / Trunk Assessment Cervical / Trunk Assessment: Normal  Communication   Communication: No difficulties  Cognition Arousal/Alertness: Awake/alert Behavior During Therapy: WFL for tasks assessed/performed Overall Cognitive Status: No family/caregiver present to determine baseline cognitive functioning  General Comments: Pt oriented to self and [facility], states someone just told him the date and able to recall correctly. Pt requires increased time to follow 1 step commands.      General Comments      Exercises     Assessment/Plan    PT Assessment Patient needs continued PT services  PT Problem List Decreased strength;Decreased activity tolerance;Decreased balance;Decreased coordination;Decreased cognition;Decreased knowledge of use of DME;Decreased safety awareness       PT Treatment Interventions DME instruction;Gait training;Functional mobility training;Therapeutic activities;Stair training;Therapeutic exercise;Balance  training;Neuromuscular re-education;Patient/family education;Cognitive remediation    PT Goals (Current goals can be found in the Care Plan section)  Acute Rehab PT Goals Patient Stated Goal: "take a little walk" PT Goal Formulation: With patient Time For Goal Achievement: 10/31/20 Potential to Achieve Goals: Good    Frequency Min 3X/week   Barriers to discharge        Co-evaluation               AM-PAC PT "6 Clicks" Mobility  Outcome Measure Help needed turning from your back to your side while in a flat bed without using bedrails?: A Little Help needed moving from lying on your back to sitting on the side of a flat bed without using bedrails?: A Little Help needed moving to and from a bed to a chair (including a wheelchair)?: A Little Help needed standing up from a chair using your arms (e.g., wheelchair or bedside chair)?: A Little Help needed to walk in hospital room?: A Little Help needed climbing 3-5 steps with a railing? : A Lot 6 Click Score: 17    End of Session Equipment Utilized During Treatment: Gait belt Activity Tolerance: Patient tolerated treatment well Patient left: in chair;with call bell/phone within reach;with chair alarm set Nurse Communication: Mobility status PT Visit Diagnosis: Unsteadiness on feet (R26.81);Other abnormalities of gait and mobility (R26.89);Muscle weakness (generalized) (M62.81)    Time: 9163-8466 PT Time Calculation (min) (ACUTE ONLY): 19 min   Charges:   PT Evaluation $PT Eval Moderate Complexity: 1 Mod           Tori Tremeka Helbling PT,  DPT 10/17/20, 1:02 PM

## 2020-10-17 NOTE — Progress Notes (Signed)
At 1532, Pt complained about left arm shaking, RN noticed the whole Left arm was jerking, pt was still talking, no change in orientation. The jerking lasted for 10 minutes. MD notified, Keppra and  EEG ordered. ICU nurse called and pt was examined. Vital sign was stable( see chart), and BCG was 172, no change in LOC.  Will continue to monitor.

## 2020-10-17 NOTE — Evaluation (Signed)
Occupational Therapy Evaluation Patient Details Name: Benjamin Willis. MRN: [id number] DOB: 1945-07-26 Today's Date: 10/17/2020    History of Present Illness Patient is a 75 year old male presenting with generalized weakness and a fall. 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 Patient starting brain radiation 6/15. PMH includes DM; HTN; HLD   Clinical Impression   Patient lives alone in an apartment at baseline and independent, per chart review was outside cutting grass ~1 week before this hospitalization. Family was not present however per chart the sister would like to take patient home with her and her husband, patient stating would like to go home "they can't pick me up if I fall." Unsure of patient's baseline cognition however patient with poor insight to current deficits. Noted L sided inattention needing increased verbal cues to attend to L side during MMT/coordination testing as well as ambulation with walker patient running into obstacles on L side and unable to problem solve without significant cues. Also observe L sided weakness compared to R, decreased coordination and mild to moderate dysmetria. Patient needing up to mod A with rolling walker during functional ambulation and transfers as well as for LB ADLs. Patient is very high risk and would require significant assistance with self care, mobility at D/C. Unsure if family would be able to provide current assist levels therefore recommended rehab vs [facility]    Follow Up Recommendations  SNF;Other (comment) (vs HH and 24/7 if family able to provide level of assistance)    Equipment Recommendations  3 in 1 bedside commode       Precautions / Restrictions Precautions Precautions: Fall Restrictions Weight Bearing Restrictions: No      Mobility Bed Mobility Overal bed mobility: Needs Assistance Bed Mobility: Supine to Sit;Sit to Supine     Supine to sit: Min assist Sit to supine: Min  assist   General bed mobility comments: min A for safety due to ataxic L side    Transfers Overall transfer level: Needs assistance Equipment used: Rolling walker (2 wheeled) Transfers: Sit to/from [facility] Sit to Stand: Min assist Stand pivot transfers: Mod assist       General transfer comment: please see toilet transfer in ADL section; high fall risk, appears to have L inattention    Balance Overall balance assessment: History of Falls;Needs assistance Sitting-balance support: Feet supported Sitting balance-Leahy Scale: Good     Standing balance support: Single extremity supported;Bilateral upper extremity supported Standing balance-Leahy Scale: Poor Standing balance comment: needs UE support and up to mod A for safety                           ADL either performed or assessed with clinical judgement   ADL Overall ADL's : Needs assistance/impaired     Grooming: Wash/dry face;Set up;Sitting   Upper Body Bathing: Supervision/ safety;Set up;Sitting   Lower Body Bathing: Moderate assistance;Sitting/lateral leans;Sit to/from stand Lower Body Bathing Details (indicate cue type and reason): for safety in standing due to poor balance and high fall risk Upper Body Dressing : Supervision/safety;Set up;Sitting   Lower Body Dressing: Moderate assistance;Sitting/lateral leans;Sit to/from stand Lower Body Dressing Details (indicate cue type and reason): patient able to doff soiled R sock, unable left Toilet Transfer: Moderate assistance;Cueing for safety;Cueing for sequencing;Ambulation;RW Toilet Transfer Details (indicate cue type and reason): patient is very high fall risk, pushing walker into obstacles in room and unable to problem solve  how to maneuver around without cues. letting go of walker with L UE and placing L LE outside frame of walker. mod A in standing to pivot into recliner chair due to urine incontinence Toileting- Clothing Manipulation  and Hygiene: Moderate assistance;Sit to/from stand Toileting - Clothing Manipulation Details (indicate cue type and reason): patient start voiding in standing and OT unable to safely reach for urinal therefore voided on the floor. Cues in sitting to wash peri area, legs     Functional mobility during ADLs: Moderate assistance;Cueing for safety;Cueing for sequencing;Rolling walker General ADL Comments: patient requiring significant assistance with all self care tasks due to cognition, decreased coordination, balance, strength, safety awareness      Pertinent Vitals/Pain Pain Assessment: No/denies pain     Hand Dominance Right   Extremity/Trunk Assessment Upper Extremity Assessment Upper Extremity Assessment: LUE deficits/detail LUE Deficits / Details: note mild ROM deficits in shoulder pivots compared to R shoulder, decreased strength 4-/5 compared to 4+/5 on R side, note a moderate dysmetria on L LUE Coordination: decreased fine motor;decreased gross motor   Lower Extremity Assessment Lower Extremity Assessment: Defer to PT evaluation   Cervical / Trunk Assessment Cervical / Trunk Assessment: Normal   Communication Communication Communication: No difficulties   Cognition Arousal/Alertness: Awake/alert Behavior During Therapy: WFL for tasks assessed/performed Overall Cognitive Status: Impaired/Different from baseline Area of Impairment: Orientation;Attention;Memory;Following commands;Safety/judgement;Awareness;Problem solving                 Orientation Level: Disoriented to;Place;Time Current Attention Level: Focused Memory: Decreased short-term memory Following Commands: Follows one step commands with increased time;Follows one step commands inconsistently Safety/Judgement: Decreased awareness of safety;Decreased awareness of deficits Awareness: Intellectual Problem Solving: Difficulty sequencing;Requires verbal cues;Requires tactile cues General Comments: patient is  high fall risk and very unsafe, appers to have a L inattention running walker into obstacles on left side and needing increased cues to attend to L UE/L side              Home Living Family/patient expects to be discharged to:: Private residence Living Arrangements: Other relatives (sister and brother in law) Available Help at Discharge: Family Type of Home: House Home Access: Stairs to enter Technical brewer of Steps: 2-3   Home Layout: Two level;Able to live on main level with bedroom/bathroom     Bathroom Shower/Tub: Teacher, early years/pre: Standard     Home Equipment: Cane - single point;Walker - 2 wheels;Bedside commode;Grab bars - tub/shower   Additional Comments: Per chart review sister plans to bring patient home with her and her husband. Family not present at time of eval with patient stating he would rather go home however would be very unsafe at this time. Will need to confirm with patient's sister the above information as I asked patient about her home set up vs patient's apartment.      Prior Functioning/Environment Level of Independence: Independent        Comments: per chart review patient was cutting his lawn about a week ago before weakness progressed        OT Problem List: Decreased strength;Decreased range of motion;Decreased activity tolerance;Impaired balance (sitting and/or standing);Decreased coordination;Decreased cognition;Decreased safety awareness;Decreased knowledge of use of DME or AE;Impaired UE functional use      OT Treatment/Interventions: Self-care/ADL training;Therapeutic exercise;DME and/or AE instruction;Therapeutic activities;Cognitive remediation/compensation;Patient/family education;Balance training    OT Goals(Current goals can be found in the care plan section) Acute Rehab OT Goals Patient Stated Goal: "go to my place" OT Goal  Formulation: With patient Time For Goal Achievement: 10/31/20 Potential to Achieve  Goals: Fair  OT Frequency: Min 2X/week    AM-PAC OT "6 Clicks" Daily Activity     Outcome Measure Help from another person eating meals?: A Little Help from another person taking care of personal grooming?: A Little Help from another person toileting, which includes using toliet, bedpan, or urinal?: A Lot Help from another person bathing (including washing, rinsing, drying)?: A Lot Help from another person to put on and taking off regular upper body clothing?: A Little Help from another person to put on and taking off regular lower body clothing?: A Lot 6 Click Score: 15   End of Session Equipment Utilized During Treatment: Rolling walker Nurse Communication: Mobility status;Other (comment) (needs another condom cath)  Activity Tolerance: Patient tolerated treatment well Patient left: in bed;with call bell/phone within reach;with bed alarm set  OT Visit Diagnosis: Unsteadiness on feet (R26.81);Other abnormalities of gait and mobility (R26.89);Muscle weakness (generalized) (M62.81);History of falling (Z91.81);Other symptoms and signs involving cognitive function                Time: 6222-9798 OT Time Calculation (min): 32 min Charges:  OT General Charges $OT Visit: 1 Visit OT Evaluation $OT Eval Moderate Complexity: 1 Mod OT Treatments $Self Care/Home Management : 8-22 mins  Delbert Phenix OT OT pager: [place] 10/17/2020, 12:22 PM

## 2020-10-17 NOTE — Progress Notes (Addendum)
PROGRESS NOTE    Benjamin Willis.  WER:[id number] DOB: 1945/10/29 DOA: 10/15/2020 PCP: Horald Pollen, MD   Chief Complaint  Patient presents with   Weakness   Brief Narrative:  Benjamin Hirschman. is Benjamin Willis 75 y.o. male with medical history significant of DM; HTN; HLD; and metastatic lung cancer presenting with weakness. He had [name] screening CT with [name] RUL nodule.  He was scheduled for thoracoscopy with wedge resection in 09/2018 but the patient canceled the procedure and was lost to follow up.  On repeat imaging in 01/2019 there was interval progression on imaging.   He had  Zlata Alcaide bronch on 9/30 with confirmation of Stage 1A2 adenocarcinoma and initiated radiation therapy.  PET scan in 02/2020 showed 3 hypermetabolic RUL nodules that were c/w disease recurrence.  He was supposed to return for CT simulation on 02/20/20 and appears to have again been lost to follow-up.  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  He attended school through about 5th or 6th grade.  He reports that he came to the ER because his sister made him get off the couch and come in.  He fell sometime ago and appears to have been mostly confined to the couch recently due to generalized weakness.  He does report that he has [name] "long leg and [name] short leg" and the short (left) leg apparently gave way and caused him to fall.     [name] spoke with his sister and he lives near her.  He does have some learning challenges, does not read or write well.  With the COVID, she was not allowed to attend appointments with her brother and she was unaware of the issues.  His sister is interested in palliative care and [name] services.  She says he "has [name]" and thinks he would want resuscitation.    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  Unfortunately lost to follow up. - now with MRI brain showing four brain lesions consistent with metastases, extensive vasogenic edema associated with posterior right frontal lesion without midline shift - CT CAP with interval evolution of radiation friboris in the perihilar and suprhilar right lung.  Nodular component in the right upper central lobe is minimally decreased in size alothough more difficult to appreciate against background of developing fibrosis.  Findings c/w treatment response and expected post treatment appearance.  No evidence of LAD or metastatic disease in chest, abdomen, or pelvis.  - dexamethasone - oncology c/s, appreciate recs - radiation oncology c/s, appreciate assistance -Patient has apparent Intellectual Disability and does not appear to understand his illness; palliative care consultation has been requested.  Will discuss with sister. -PT/OT/TOC team consults also requested, as he appears likely to benefit from support at home vs. Placement   Left Arm Shaking - per RN, lasted about 10 min - in setting of above, concerning for partial seizure - EEG - keppra - neurooncology, discussed over phone, plan for follow outpatient  DM -A1c 7.4 on recent check -hold Glucophage, Glucotrol, Januvia -Cover with moderate-scale SSI    HTN -Continue Norvasc, Lisinopril   HLD -Continue Crestor   Glaucoma -Continue Xalatan  DVT prophylaxis: SCD Code Status: full  Family Communication: none at bedside Disposition:   Status is: Inpatient  Remains inpatient appropriate because:Inpatient level of care appropriate due to severity of illness  Dispo: The patient is from: Home               Anticipated d/c is to: Home              Patient currently is not medically stable to d/c.   Difficult to place patient No       Consultants:  Oncology Neuro oncology Radiation oncology  Procedures:  none  Antimicrobials:  Anti-infectives (From admission, onward)    None       Subjective: No complaints  Objective: Vitals:   10/17/20 0011 10/17/20 0410 10/17/20 1346 10/17/20 1550  BP: 120/86 (!) 157/79 (!) 143/63 (!) 149/92  Pulse: 66 63 67 90  Resp: 20 17 20    Temp: 98.5 F (36.9 C)  98.9 F (37.2 C)   TempSrc: Oral  Oral   SpO2: 100% 98% 100% 99%  Weight:        Intake/Output Summary (Last 24 hours) at 10/17/2020 1852 Last data filed at 10/17/2020 1444 Gross per 24 hour  Intake 480 ml  Output 300 ml  Net 180 ml   Filed Weights   10/16/20 1610  Weight: 72.4 kg    Examination:  General: No acute distress. Cardiovascular: Heart sounds show [name] regular rate, and rhythm.  Lungs: Clear to auscultation bilaterally  Abdomen: Soft, nontender, nondistended Neurological: Alert and oriented 3. Moves all extremities 4. Cranial nerves II through XII grossly intact. Skin: Warm and dry. No rashes or lesions. Extremities: No clubbing or cyanosis. No edema.    Data Reviewed: I have personally reviewed following labs and imaging studies  CBC: Recent Labs  Lab 10/15/20 2009 10/17/20 0659  WBC 4.6 6.9  NEUTROABS 2.9  --   HGB 13.0 12.6*  HCT 41.1 38.6*  MCV 96.0 94.8  PLT 245 354    Basic Metabolic Panel: Recent Labs  Lab 10/15/20 2009 10/17/20 0545  NA 140 138  K 4.1 4.8  CL 106 105  CO2 25 24  GLUCOSE 118* 185*  BUN 11 20  CREATININE 0.72 0.92  CALCIUM 9.2 9.2  MG 2.2  --     GFR: Estimated Creatinine Clearance: 64.5 mL/min (by C-G formula based on SCr of 0.92 mg/dL).  Liver Function Tests: Recent Labs  Lab 10/15/20 2009  AST 18  ALT 17  ALKPHOS 79  BILITOT 0.4  PROT 6.9  ALBUMIN 4.0    CBG: Recent Labs  Lab 10/16/20 1803  10/16/20 2232 10/17/20 0825 10/17/20 1136 10/17/20 1550  GLUCAP 235* 148* 177* 204* 172*     Recent Results (from the past 240 hour(s))  Resp Panel by RT-PCR (Flu Davanna He&B, Covid) Nasopharyngeal Swab     Status: None   Collection Time: 10/11/20  2:55 PM   Specimen: Nasopharyngeal Swab; Nasopharyngeal(NP) swabs in vial transport medium  Result Value Ref Range Status   SARS Coronavirus 2 by RT PCR NEGATIVE NEGATIVE Final    Comment: (NOTE) SARS-CoV-2 target nucleic acids are NOT DETECTED.  The SARS-CoV-2 RNA is generally detectable in upper respiratory specimens during the acute phase of infection. The lowest concentration of SARS-CoV-2 viral copies this assay can  detect is 138 copies/mL. Jenesis Martin negative result does not preclude SARS-Cov-2 infection and should not be used as the sole basis for treatment or other patient management decisions. Maisha Bogen negative result may occur with  improper specimen collection/handling, submission of specimen other than nasopharyngeal swab, presence of viral mutation(s) within the areas targeted by this assay, and inadequate number of viral copies(<138 copies/mL). Indalecio Malmstrom negative result must be combined with clinical observations, patient history, and epidemiological information. The expected result is Negative.  Fact Sheet for Patients:  EntrepreneurPulse.com.au  Fact Sheet for Healthcare Providers:  IncredibleEmployment.be  This test is no t yet approved or cleared by the [place] FDA and  has been authorized for detection and/or diagnosis of SARS-CoV-2 by FDA under an Emergency Use Authorization (EUA). This EUA will remain  in effect (meaning this test can be used) for the duration of the COVID-19 declaration under Section 564(b)(1) of the Act, 21 U.S.C.section 360bbb-3(b)(1), unless the authorization is terminated  or revoked sooner.       Influenza Mychael Smock by PCR NEGATIVE NEGATIVE Final   Influenza B by PCR NEGATIVE  NEGATIVE Final    Comment: (NOTE) The Xpert Xpress SARS-CoV-2/FLU/RSV plus assay is intended as an aid in the diagnosis of influenza from Nasopharyngeal swab specimens and should not be used as [employer] sole basis for treatment. Nasal washings and aspirates are unacceptable for Xpert Xpress SARS-CoV-2/FLU/RSV testing.  Fact Sheet for Patients: EntrepreneurPulse.com.au  Fact Sheet for Healthcare Providers: IncredibleEmployment.be  This test is not yet approved or cleared by the [place] FDA and has been authorized for detection and/or diagnosis of SARS-CoV-2 by FDA under an Emergency Use Authorization (EUA). This EUA will remain in effect (meaning this test can be used) for the duration of the COVID-19 declaration under Section 564(b)(1) of the Act, 21 U.S.C. section 360bbb-3(b)(1), unless the authorization is terminated or revoked.  Performed at [facility], [place] [address] [place], [place] [address]   SARS CORONAVIRUS 2 (TAT 6-24 HRS) Nasopharyngeal Nasopharyngeal Swab     Status: None   Collection Time: 10/16/20  7:42 AM   Specimen: Nasopharyngeal Swab  Result Value Ref Range Status   SARS Coronavirus 2 NEGATIVE NEGATIVE Final    Comment: (NOTE) SARS-CoV-2 target nucleic acids are NOT DETECTED.  The SARS-CoV-2 RNA is generally detectable in upper and lower respiratory specimens during the acute phase of infection. Negative results do not preclude SARS-CoV-2 infection, do not rule out co-infections with other pathogens, and should not be used as the sole basis for treatment or other patient management decisions. Negative results must be combined with clinical observations, patient history, and epidemiological information. The expected result is Negative.  Fact Sheet for Patients: SugarRoll.be  Fact Sheet for Healthcare Providers: https://www.woods-mathews.com/  This test is not yet  approved or cleared by the [place] FDA and  has been authorized for detection and/or diagnosis of SARS-CoV-2 by FDA under an Emergency Use Authorization (EUA). This EUA will remain  in effect (meaning this test can be used) for the duration of the COVID-19 declaration under Se ction 564(b)(1) of the Act, 21 U.S.C. section 360bbb-3(b)(1), unless the authorization is terminated or revoked sooner.  Performed at [facility], [place] [address], [place], [place] [address]          Radiology Studies: DG Chest 2 View  Result Date: 10/15/2020 CLINICAL DATA:  75 year old male with weakness. EXAM: CHEST - 2 VIEW COMPARISON:  Chest radiograph dated 10/11/2020. FINDINGS: Shallow inspiration. Focal area  of rounded opacity with several associated fiducial markers as seen on the prior radiograph. No new consolidation. There is no pleural effusion or pneumothorax. Mild cardiomegaly. No acute osseous pathology. IMPRESSION: 1. No acute cardiopulmonary process. No interval change in the appearance of the rounded right upper lobe density. 2. Mild cardiomegaly. Electronically Signed   By: Anner Crete M.D.   On: 10/15/2020 20:41   CT Head Wo Contrast  Result Date: 10/15/2020 CLINICAL DATA:  Weakness EXAM: CT HEAD WITHOUT CONTRAST TECHNIQUE: Contiguous axial images were obtained from the base of the skull through the vertex without intravenous contrast. COMPARISON:  None. FINDINGS: Brain: Cystic mass is seen in the right parietal lobe measuring 3.3 cm with surrounding extensive vasogenic edema. Probable cystic mass in the left occipital lobe measuring 2 cm with surrounding edema. Possible additional lesion in the right occipital lobe and right cerebellar hemisphere. Findings suspicious for metastatic disease. No hemorrhage. No midline shift or hydrocephalus. Vascular: No hyperdense vessel or unexpected calcification. Skull: No acute calvarial abnormality. Sinuses/Orbits: No acute findings Other: None  IMPRESSION: Multiple apparent cystic lesions within the brain with surrounding vasogenic edema concerning for metastatic disease. Recommend further evaluation with MRI. These results were called by telephone at the time of interpretation on 10/15/2020 at 9:03 pm to provider [facility] , who verbally acknowledged these results. Electronically Signed   By: Rolm Baptise M.D.   On: 10/15/2020 21:05   MR Brain W and Wo Contrast  Result Date: 10/16/2020 CLINICAL DATA:  Left greater than right-sided weakness. Multiple brain lesions on CT. History of lung cancer. EXAM: MRI HEAD WITHOUT AND WITH CONTRAST TECHNIQUE: Multiplanar, multiecho pulse sequences of the brain and surrounding structures were obtained without and with intravenous contrast. CONTRAST:  7.55mL GADAVIST GADOBUTROL 1 MMOL/ML IV SOLN COMPARISON:  Head CT 10/15/2020 FINDINGS: Brain: Partially cystic lesions with irregular peripheral enhancement measured 2.2 x 1.7 cm in the posteromedial right cerebellar hemisphere (series 10, image 16), 2.6 x 2.1 cm in the left occipital lobe (series 10, image 27), 2.4 x 2.0 cm in the medial right parietal lobe (series 10, image 32), and 3.3 x 3.1 cm in the posterior right frontal lobe (series 10, image 40). There is Tona Qualley small amount of chronic blood products associated with the lesions. There is extensive vasogenic edema associated with the posterior right frontal lesion and milder edema associated with the other cerebral and cerebellar lesions. There is no midline shift. Scattered small foci of T2 hyperintensity in the cerebral white matter and pons are nonspecific but compatible with mild chronic small vessel ischemic disease. No acute infarct or extra-axial fluid collection is evident. Cerebral volume is within normal limits for age. Vascular: Major intracranial vascular flow voids are preserved. Skull and upper cervical spine: Unremarkable bone marrow signal. Sinuses/Orbits: Mild depression of the right lamina  papyracea which may reflect Sophya Vanblarcom remote orbital fracture. Paranasal sinuses and mastoid air cells are clear. Other: None. IMPRESSION: 1. Four brain lesions consistent with metastases. Extensive vasogenic edema associated with the posterior right frontal lesion without midline shift. 2. Mild chronic small vessel ischemic disease. Electronically Signed   By: Logan Bores M.D.   On: 10/16/2020 09:25   CT CHEST ABDOMEN PELVIS W CONTRAST  Result Date: 10/16/2020 CLINICAL DATA:  Brain metastases, staging, history of lung cancer EXAM: CT CHEST, ABDOMEN, AND PELVIS WITH CONTRAST TECHNIQUE: Multidetector CT imaging of the chest, abdomen and pelvis was performed following the standard protocol during bolus administration of intravenous contrast. CONTRAST:  131mL OMNIPAQUE  IOHEXOL 300 MG/ML  SOLN COMPARISON:  02/13/2020 FINDINGS: CT CHEST FINDINGS Cardiovascular: Aortic atherosclerosis. Normal heart size. No pericardial effusion. Mediastinum/Nodes: No enlarged mediastinal, hilar, or axillary lymph nodes. Thyroid gland, trachea, and esophagus demonstrate no significant findings. Lungs/Pleura: Expected interval evolution of radiation fibrosis in the perihilar and suprahilar right lung. Kenji Mapel nodular component in the central right upper lobe is minimally decreased in size although more difficult to appreciate against Bexley Laubach background of developing fibrosis, measuring approximately 1.4 x 1.3 cm, previously 1.5 x 1.5 cm (series 5, image 51). No pleural effusion or pneumothorax. Musculoskeletal: No chest wall mass or suspicious bone lesions identified. CT ABDOMEN PELVIS FINDINGS Hepatobiliary: No solid liver abnormality is seen. No gallstones, gallbladder wall thickening, or biliary dilatation. Pancreas: Unremarkable. No pancreatic ductal dilatation or surrounding inflammatory changes. Spleen: Normal in size without significant abnormality. Adrenals/Urinary Tract: Stable small right adrenal adenoma, measuring 1.3 x 0.9 cm (series 3, image  61). Kidneys are normal, without renal calculi, solid lesion, or hydronephrosis. Bladder is unremarkable. Stomach/Bowel: Stomach is within normal limits. Appendix appears normal. No evidence of bowel wall thickening, distention, or inflammatory changes. Vascular/Lymphatic: Aortic atherosclerosis. No enlarged abdominal or pelvic lymph nodes. Reproductive: No mass or other abnormality. Other: Small, fat containing bilateral inguinal hernias. No abdominopelvic ascites. Musculoskeletal: No acute or significant osseous findings. IMPRESSION: 1. Expected interval evolution of radiation fibrosis in the perihilar and suprahilar right lung. Rutha Melgoza nodular component in the central right upper lobe is minimally decreased in size although more difficult to appreciate against Evangelene Vora background of developing fibrosis. Other previously described nodules are not well appreciated. Findings are consistent with treatment response and expected post treatment appearance. 2. No evidence of lymphadenopathy or metastatic disease in the chest, abdomen or pelvis. Aortic Atherosclerosis (ICD10-I70.0). Electronically Signed   By: Eddie Candle M.D.   On: 10/16/2020 10:21        Scheduled Meds:  allopurinol  100 mg Oral Daily   amLODipine  10 mg Oral Daily   dexamethasone (DECADRON) injection  4 mg Intravenous Q6H   docusate sodium  100 mg Oral BID   insulin aspart  0-15 Units Subcutaneous TID WC   latanoprost  1 drop Both Eyes QHS   lisinopril  40 mg Oral Daily   rosuvastatin  20 mg Oral QHS   Continuous Infusions:  levETIRAcetam 1,000 mg (10/17/20 1633)     LOS: 1 day    Time spent: over 30 min    Fayrene Helper, MD [facility]   To contact the attending provider between 7A-7P or the covering provider during after hours 7P-7A, please log into the web site [email or web address] and access using universal [place] password for that web site. If you do not have the password, please call the hospital operator.  10/17/2020,  6:52 PM

## 2020-10-18 ENCOUNTER — Inpatient Hospital Stay (HOSPITAL_COMMUNITY)
Admit: 2020-10-18 | Discharge: 2020-10-18 | Disposition: A | Discharge status: Home / Self Care | Payer: Medicare Other | Attending: Family Medicine | Admitting: Family Medicine

## 2020-10-18 ENCOUNTER — Ambulatory Visit
Admit: 2020-10-18 | Discharge: 2020-10-18 | Disposition: A | Discharge status: Home / Self Care | Payer: Medicare Other | Attending: Radiation Oncology | Admitting: Radiation Oncology

## 2020-10-18 DIAGNOSIS — Z515 Encounter for palliative care: Secondary | ICD-10-CM | POA: Diagnosis not present

## 2020-10-18 DIAGNOSIS — C7931 Secondary malignant neoplasm of brain: Secondary | ICD-10-CM | POA: Diagnosis not present

## 2020-10-18 DIAGNOSIS — Z7189 Other specified counseling: Secondary | ICD-10-CM

## 2020-10-18 DIAGNOSIS — C3490 Malignant neoplasm of unspecified part of unspecified bronchus or lung: Secondary | ICD-10-CM | POA: Diagnosis not present

## 2020-10-18 LAB — CBC WITH DIFFERENTIAL/PLATELET
Abs Immature Granulocytes: 0.04 10*3/uL (ref 0.00–0.07)
Basophils Absolute: 0 10*3/uL (ref 0.0–0.1)
Basophils Relative: 0 %
Eosinophils Absolute: 0 10*3/uL (ref 0.0–0.5)
Eosinophils Relative: 0 %
HCT: 39 % (ref 39.0–52.0)
Hemoglobin: 12.6 g/dL — ABNORMAL LOW (ref 13.0–17.0)
Immature Granulocytes: 0 %
Lymphocytes Relative: 5 %
Lymphs Abs: 0.6 10*3/uL — ABNORMAL LOW (ref 0.7–4.0)
MCH: 31.1 pg (ref 26.0–34.0)
MCHC: 32.3 g/dL (ref 30.0–36.0)
MCV: 96.3 fL (ref 80.0–100.0)
Monocytes Absolute: 0.4 10*3/uL (ref 0.1–1.0)
Monocytes Relative: 4 %
Neutro Abs: 10.8 10*3/uL — ABNORMAL HIGH (ref 1.7–7.7)
Neutrophils Relative %: 91 %
Platelets: 254 10*3/uL (ref 150–400)
RBC: 4.05 MIL/uL — ABNORMAL LOW (ref 4.22–5.81)
RDW: 12 % (ref 11.5–15.5)
WBC: 11.9 10*3/uL — ABNORMAL HIGH (ref 4.0–10.5)
nRBC: 0 % (ref 0.0–0.2)

## 2020-10-18 LAB — PHOSPHORUS: Phosphorus: 4 mg/dL (ref 2.5–4.6)

## 2020-10-18 LAB — COMPREHENSIVE METABOLIC PANEL
ALT: 20 U/L (ref 0–44)
AST: 20 U/L (ref 15–41)
Albumin: 3.7 g/dL (ref 3.5–5.0)
Alkaline Phosphatase: 75 U/L (ref 38–126)
Anion gap: 9 (ref 5–15)
BUN: 23 mg/dL (ref 8–23)
CO2: 25 mmol/L (ref 22–32)
Calcium: 9.2 mg/dL (ref 8.9–10.3)
Chloride: 105 mmol/L (ref 98–111)
Creatinine, Ser: 0.93 mg/dL (ref 0.61–1.24)
GFR, Estimated: >60 mL/min (ref >60)
Glucose, Bld: 184 mg/dL — ABNORMAL HIGH (ref 70–99)
Potassium: 4.1 mmol/L (ref 3.5–5.1)
Sodium: 139 mmol/L (ref 135–145)
Total Bilirubin: 0.2 mg/dL — ABNORMAL LOW (ref 0.3–1.2)
Total Protein: 6.8 g/dL (ref 6.5–8.1)

## 2020-10-18 LAB — GLUCOSE, CAPILLARY
Glucose-Capillary: 147 mg/dL — ABNORMAL HIGH (ref 70–99)
Glucose-Capillary: 229 mg/dL — ABNORMAL HIGH (ref 70–99)
Glucose-Capillary: 212 mg/dL — ABNORMAL HIGH (ref 70–99)
Glucose-Capillary: 348 mg/dL — ABNORMAL HIGH (ref 70–99)

## 2020-10-18 LAB — MAGNESIUM: Magnesium: 2.3 mg/dL (ref 1.7–2.4)

## 2020-10-18 MED ORDER — INSULIN ASPART 100 UNIT/ML IJ SOLN
3.0000 [IU] | Freq: Three times a day (TID) | INTRAMUSCULAR | Status: DC
  Administered 2020-10-19 – 2020-10-23 (×14): 3 [IU] via SUBCUTANEOUS

## 2020-10-18 NOTE — Progress Notes (Signed)
EEG complete - results pending 

## 2020-10-18 NOTE — Progress Notes (Signed)
Inpatient Diabetes Program Recommendations  AACE/ADA: New Consensus Statement on Inpatient Glycemic Control (2015)  Target Ranges:  Prepandial:   less than 140 mg/dL      Peak postprandial:   less than 180 mg/dL (1-2 hours)      Critically ill patients:  140 - 180 mg/dL   Lab Results  Component Value Date   GLUCAP 147 (H) 10/18/2020   HGBA1C 7.4 (A) 09/03/2020    Review of Glycemic Control Results for Benjamin Willis, Benjamin Willis (MRN [id number]) as of 10/18/2020 10:04  Ref. Range 10/17/2020 08:25 10/17/2020 11:36 10/17/2020 15:50 10/17/2020 21:46 10/18/2020 07:43  Glucose-Capillary Latest Ref Range: 70 - 99 mg/dL 177 (H) 204 (H)  Novolog 5 units 172 (H)  Novolog 3 units 275 (H)   147 (H)   Diabetes history: DM 2 Outpatient Diabetes medications: Glipizide 5 mg bid, Januvia 100 mg Daily, metformin 500 mg Daily Current orders for Inpatient glycemic control:  Novolog 0-15 units tid  Inpatient Diabetes Program Recommendations:    -  Consider Novolog 3 units tid meal coverage if eating >50% of meals while on Decadron  Thanks,  Tama Headings RN, MSN, BC-ADM Inpatient Diabetes Coordinator Team Pager [phone] (8a-5p)

## 2020-10-18 NOTE — Progress Notes (Signed)
PROGRESS NOTE    Benjamin Florina Ou.  VZD:[id number] DOB: 1945-06-29 DOA: 10/15/2020 PCP: Horald Pollen, MD   Chief Complaint  Patient presents with   Weakness   Brief Narrative:  Benjamin Tiley. is Benjamin Willis 74 y.o. male with medical history significant of DM; HTN; HLD; and metastatic lung cancer presenting with weakness. He had Benjamin Willis screening CT with Benjamin Willis RUL nodule.  He was scheduled for thoracoscopy with wedge resection in 09/2018 but the patient canceled the procedure and was lost to follow up.  On repeat imaging in 01/2019 there was interval progression on imaging.   He had  Benjamin Willis bronch on 9/30 with confirmation of Stage 1A2 adenocarcinoma and initiated radiation therapy.  PET scan in 02/2020 showed 3 hypermetabolic RUL nodules that were c/w disease recurrence.  He was supposed to return for CT simulation on 02/20/20 and appears to have again been lost to follow-up.  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  He attended school through about 5th or 6th grade.  He reports that he came to the ER because his sister made him get off the couch and come in.  He fell sometime ago and appears to have been mostly confined to the couch recently due to generalized weakness.  He does report that he has Benjamin Willis "long leg and Benjamin Willis short leg" and the short (left) leg apparently gave way and caused him to fall.     Dr. Lorin Willis spoke with his sister and he lives near her.  He does have some learning challenges, does not read or write well.  With the COVID, she was not allowed to attend appointments with her brother and she was unaware of the issues.  His sister is interested in palliative care and [facility] services.  She says he "has Benjamin Willis zest for life" and thinks he would want resuscitation.    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  Unfortunately lost to follow up. - now with MRI brain showing four brain lesions consistent with metastases, extensive vasogenic edema associated with posterior right frontal lesion without midline shift - CT CAP with interval evolution of radiation friboris in the perihilar and suprhilar right lung.  Nodular component in the right upper central lobe is minimally decreased in size alothough more difficult to appreciate against background of developing fibrosis.  Findings c/w treatment response and expected post treatment appearance.  No evidence of LAD or metastatic disease in chest, abdomen, or pelvis.  - dexamethasone - oncology c/s, appreciate recs - radiation oncology c/s, appreciate assistance -Patient has apparent Intellectual Disability and does not appear to understand his illness; palliative care consultation has been requested.  Will discuss with sister. -PT/OT/TOC team consults also requested, as he appears likely to benefit from support at home vs. Placement   Left Arm Shaking  Concern for Partial Seizures - per RN, lasted about 10  min on 6/15  - in setting of above, concerning for partial seizure - EEG is pending at this time - keppra - neurooncology, discussed over phone, plan for follow outpatient  DM -A1c 7.4 on recent check -hold Glucophage, Glucotrol, Januvia -Cover with moderate-scale SSI - mealtime insulin - on steroids above   HTN -Continue Norvasc, Lisinopril   HLD -Continue Crestor   Glaucoma -Continue Xalatan  DVT prophylaxis: SCD Code Status: full  Family  Communication: none at bedside - sister over phone Disposition:   Status is: Inpatient  Remains inpatient appropriate because:Inpatient level of care appropriate due to severity of illness  Dispo: The patient is from: Home              Anticipated d/c is to: Home              Patient currently is not medically stable to d/c.   Difficult to place patient No       Consultants:  Oncology Neuro oncology Radiation oncology  Procedures:  none  Antimicrobials:  Anti-infectives (From admission, onward)    None       Subjective: Denies any new complaints today  Objective: Vitals:   10/17/20 1550 10/17/20 2149 10/18/20 0654 10/18/20 1356  BP: (!) 149/92 127/70 (!) 151/94 135/73  Pulse: 90 (!) 59 62 62  Resp:  17 17 16   Temp:  98.2 F (36.8 C) 97.9 F (36.6 C) 98.1 F (36.7 C)  TempSrc:  Oral Oral Oral  SpO2: 99% 100% 100% 98%  Weight:        Intake/Output Summary (Last 24 hours) at 10/18/2020 1615 Last data filed at 10/18/2020 0930 Gross per 24 hour  Intake 300 ml  Output --  Net 300 ml   Filed Weights   10/16/20 1610  Weight: 72.4 kg    Examination:  General: No acute distress. Cardiovascular: Heart sounds show Benjamin Willis regular rate, and rhythm.  Lungs: Clear to auscultation bilaterally . Abdomen: Soft, nontender, nondistended Neurological: Alert and oriented 3. Moves all extremities 4. Cranial nerves II through XII grossly intact. Skin: Warm and dry. No rashes or lesions. Extremities: No clubbing or cyanosis. No edema.   Data Reviewed: I have personally reviewed following labs and imaging studies  CBC: Recent Labs  Lab 10/15/20 2009 10/17/20 0659 10/18/20 0526  WBC 4.6 6.9 11.9*  NEUTROABS 2.9  --  10.8*  HGB 13.0 12.6* 12.6*  HCT 41.1 38.6* 39.0  MCV 96.0 94.8 96.3  PLT 245 249 675    Basic Metabolic Panel: Recent Labs  Lab 10/15/20 2009 10/17/20 0545 10/17/20 2203 10/18/20 0526  NA 140 138  --  139  K 4.1 4.8  --  4.1  CL 106 105   --  105  CO2 25 24  --  25  GLUCOSE 118* 185*  --  184*  BUN 11 20  --  23  CREATININE 0.72 0.92  --  0.93  CALCIUM 9.2 9.2  --  9.2  MG 2.2  --   --  2.3  PHOS  --   --  3.8  --     GFR: Estimated Creatinine Clearance: 63.8 mL/min (by C-G formula based on SCr of 0.93 mg/dL).  Liver Function Tests: Recent Labs  Lab 10/15/20 2009 10/18/20 0526  AST 18 20  ALT 17 20  ALKPHOS 79 75  BILITOT 0.4 0.2*  PROT 6.9 6.8  ALBUMIN 4.0 3.7    CBG: Recent Labs  Lab 10/17/20 1136 10/17/20 1550 10/17/20  2146 10/18/20 0743 10/18/20 1152  GLUCAP 204* 172* 275* 147* 229*     Recent Results (from the past 240 hour(s))  Resp Panel by RT-PCR (Flu Naylea Wigington&B, Covid) Nasopharyngeal Swab     Status: None   Collection Time: 10/11/20  2:55 PM   Specimen: Nasopharyngeal Swab; Nasopharyngeal(NP) swabs in vial transport medium  Result Value Ref Range Status   SARS Coronavirus 2 by RT PCR NEGATIVE NEGATIVE Final    Comment: (NOTE) SARS-CoV-2 target nucleic acids are NOT DETECTED.  The SARS-CoV-2 RNA is generally detectable in upper respiratory specimens during the acute phase of infection. The lowest concentration of SARS-CoV-2 viral copies this assay can detect is 138 copies/mL. Lainey Nelson negative result does not preclude SARS-Cov-2 infection and should not be used as the sole basis for treatment or other patient management decisions. Alee Gressman negative result may occur with  improper specimen collection/handling, submission of specimen other than nasopharyngeal swab, presence of viral mutation(s) within the areas targeted by this assay, and inadequate number of viral copies(<138 copies/mL). Kagan Hietpas negative result must be combined with clinical observations, patient history, and epidemiological information. The expected result is Negative.  Fact Sheet for Patients:  EntrepreneurPulse.com.au  Fact Sheet for Healthcare Providers:  IncredibleEmployment.be  This test is no t  yet approved or cleared by the [place] FDA and  has been authorized for detection and/or diagnosis of SARS-CoV-2 by FDA under an Emergency Use Authorization (EUA). This EUA will remain  in effect (meaning this test can be used) for the duration of the COVID-19 declaration under Section 564(b)(1) of the Act, 21 U.S.C.section 360bbb-3(b)(1), unless the authorization is terminated  or revoked sooner.       Influenza Alnita Aybar by PCR NEGATIVE NEGATIVE Final   Influenza B by PCR NEGATIVE NEGATIVE Final    Comment: (NOTE) The Xpert Xpress SARS-CoV-2/FLU/RSV plus assay is intended as an aid in the diagnosis of influenza from Nasopharyngeal swab specimens and should not be used as Linder Prajapati sole basis for treatment. Nasal washings and aspirates are unacceptable for Xpert Xpress SARS-CoV-2/FLU/RSV testing.  Fact Sheet for Patients: EntrepreneurPulse.com.au  Fact Sheet for Healthcare Providers: IncredibleEmployment.be  This test is not yet approved or cleared by the [place] FDA and has been authorized for detection and/or diagnosis of SARS-CoV-2 by FDA under an Emergency Use Authorization (EUA). This EUA will remain in effect (meaning this test can be used) for the duration of the COVID-19 declaration under Section 564(b)(1) of the Act, 21 U.S.C. section 360bbb-3(b)(1), unless the authorization is terminated or revoked.  Performed at [facility], [place] [address] [place], [place] [address]   SARS CORONAVIRUS 2 (TAT 6-24 HRS) Nasopharyngeal Nasopharyngeal Swab     Status: None   Collection Time: 10/16/20  7:42 AM   Specimen: Nasopharyngeal Swab  Result Value Ref Range Status   SARS Coronavirus 2 NEGATIVE NEGATIVE Final    Comment: (NOTE) SARS-CoV-2 target nucleic acids are NOT DETECTED.  The SARS-CoV-2 RNA is generally detectable in upper and lower respiratory specimens during the acute phase of infection. Negative results do not preclude  SARS-CoV-2 infection, do not rule out co-infections with other pathogens, and should not be used as the sole basis for treatment or other patient management decisions. Negative results must be combined with clinical observations, patient history, and epidemiological information. The expected result is Negative.  Fact Sheet for Patients: SugarRoll.be  Fact Sheet for Healthcare Providers: https://www.woods-mathews.com/  This test is not yet approved or cleared by the [place] FDA  and  has been authorized for detection and/or diagnosis of SARS-CoV-2 by FDA under an Emergency Use Authorization (EUA). This EUA will remain  in effect (meaning this test can be used) for the duration of the COVID-19 declaration under Se ction 564(b)(1) of the Act, 21 U.S.C. section 360bbb-3(b)(1), unless the authorization is terminated or revoked sooner.  Performed at [facility], [place] [address] [place], [place] [address]          Radiology Studies: No results found.      Scheduled Meds:  allopurinol  100 mg Oral Daily   amLODipine  10 mg Oral Daily   dexamethasone (DECADRON) injection  4 mg Intravenous Q6H   docusate sodium  100 mg Oral BID   insulin aspart  0-15 Units Subcutaneous TID WC   latanoprost  1 drop Both Eyes QHS   lisinopril  40 mg Oral Daily   rosuvastatin  20 mg Oral QHS   Continuous Infusions:  levETIRAcetam Stopped (10/18/20 0531)     LOS: 2 days    Time spent: over 30 min    Fayrene Helper, MD [facility]   To contact the attending provider between 7A-7P or the covering provider during after hours 7P-7A, please log into the web site [email or web address] and access using universal [place] password for that web site. If you do not have the password, please call the hospital operator.  10/18/2020, 4:15 PM

## 2020-10-19 ENCOUNTER — Ambulatory Visit
Admit: 2020-10-19 | Discharge: 2020-10-19 | Disposition: A | Discharge status: Home / Self Care | Payer: Medicare Other | Attending: Radiation Oncology | Admitting: Radiation Oncology

## 2020-10-19 DIAGNOSIS — R569 Unspecified convulsions: Secondary | ICD-10-CM

## 2020-10-19 DIAGNOSIS — C3490 Malignant neoplasm of unspecified part of unspecified bronchus or lung: Secondary | ICD-10-CM | POA: Diagnosis not present

## 2020-10-19 LAB — COMPREHENSIVE METABOLIC PANEL
ALT: 21 U/L (ref 0–44)
AST: 18 U/L (ref 15–41)
Albumin: 3.3 g/dL — ABNORMAL LOW (ref 3.5–5.0)
Alkaline Phosphatase: 66 U/L (ref 38–126)
Anion gap: 6 (ref 5–15)
BUN: 24 mg/dL — ABNORMAL HIGH (ref 8–23)
CO2: 26 mmol/L (ref 22–32)
Calcium: 8.8 mg/dL — ABNORMAL LOW (ref 8.9–10.3)
Chloride: 107 mmol/L (ref 98–111)
Creatinine, Ser: 0.78 mg/dL (ref 0.61–1.24)
GFR, Estimated: >60 mL/min (ref >60)
Glucose, Bld: 181 mg/dL — ABNORMAL HIGH (ref 70–99)
Potassium: 4.9 mmol/L (ref 3.5–5.1)
Sodium: 139 mmol/L (ref 135–145)
Total Bilirubin: 0.6 mg/dL (ref 0.3–1.2)
Total Protein: 6.1 g/dL — ABNORMAL LOW (ref 6.5–8.1)

## 2020-10-19 LAB — CBC WITH DIFFERENTIAL/PLATELET
Abs Immature Granulocytes: 0.05 10*3/uL (ref 0.00–0.07)
Basophils Absolute: 0 10*3/uL (ref 0.0–0.1)
Basophils Relative: 0 %
Eosinophils Absolute: 0 10*3/uL (ref 0.0–0.5)
Eosinophils Relative: 0 %
HCT: 37.9 % — ABNORMAL LOW (ref 39.0–52.0)
Hemoglobin: 12.2 g/dL — ABNORMAL LOW (ref 13.0–17.0)
Immature Granulocytes: 1 %
Lymphocytes Relative: 5 %
Lymphs Abs: 0.5 10*3/uL — ABNORMAL LOW (ref 0.7–4.0)
MCH: 30.8 pg (ref 26.0–34.0)
MCHC: 32.2 g/dL (ref 30.0–36.0)
MCV: 95.7 fL (ref 80.0–100.0)
Monocytes Absolute: 0.4 10*3/uL (ref 0.1–1.0)
Monocytes Relative: 5 %
Neutro Abs: 8.7 10*3/uL — ABNORMAL HIGH (ref 1.7–7.7)
Neutrophils Relative %: 89 %
Platelets: 240 10*3/uL (ref 150–400)
RBC: 3.96 MIL/uL — ABNORMAL LOW (ref 4.22–5.81)
RDW: 12.1 % (ref 11.5–15.5)
WBC: 9.7 10*3/uL (ref 4.0–10.5)
nRBC: 0 % (ref 0.0–0.2)

## 2020-10-19 LAB — GLUCOSE, CAPILLARY
Glucose-Capillary: 224 mg/dL — ABNORMAL HIGH (ref 70–99)
Glucose-Capillary: 254 mg/dL — ABNORMAL HIGH (ref 70–99)
Glucose-Capillary: 214 mg/dL — ABNORMAL HIGH (ref 70–99)
Glucose-Capillary: 114 mg/dL — ABNORMAL HIGH (ref 70–99)

## 2020-10-19 LAB — MAGNESIUM: Magnesium: 2.3 mg/dL (ref 1.7–2.4)

## 2020-10-19 MED ORDER — DEXAMETHASONE 4 MG PO TABS: 4.0000 mg | ORAL_TABLET | Freq: Four times a day (QID) | ORAL | Status: DC

## 2020-10-19 MED ORDER — DEXAMETHASONE 4 MG PO TABS
4.0000 mg | ORAL_TABLET | Freq: Four times a day (QID) | ORAL | Status: DC
  Administered 2020-10-19 – 2020-10-23 (×15): 4 mg via ORAL
  Filled 2020-10-19 (×15): qty 1

## 2020-10-19 NOTE — Plan of Care (Signed)
Pt shows signs of confusion and changes of mood; basically always smiling with just a couple of episodes of flat effect; no s/s of acute distress or pain reported or observed; pt can still maintain a conversation; call light within reach and bed at lowest position for safety.

## 2020-10-19 NOTE — Progress Notes (Signed)
Inpatient Diabetes Program Recommendations  AACE/ADA: New Consensus Statement on Inpatient Glycemic Control (2015)  Target Ranges:  Prepandial:   less than 140 mg/dL      Peak postprandial:   less than 180 mg/dL (1-2 hours)      Critically ill patients:  140 - 180 mg/dL   Lab Results  Component Value Date   GLUCAP 224 (H) 10/19/2020   HGBA1C 7.4 (A) 09/03/2020    Review of Glycemic Control Results for APOLINAR, BERO (MRN [id number]) as of 10/19/2020 11:12  Ref. Range 10/18/2020 07:43 10/18/2020 11:52 10/18/2020 18:00 10/18/2020 21:40 10/19/2020 07:20  Glucose-Capillary Latest Ref Range: 70 - 99 mg/dL 147 (H) 229 (H) 212 (H) 348 (H) 224 (H)    Diabetes history: DM 2 Outpatient Diabetes medications: Glipizide 5 mg bid, Januvia 100 mg Daily, metformin 500 mg Daily Current orders for Inpatient glycemic control:  Novolog 0-15 units tid Novolog 3 units tid meal coverage  Decadron 4 mg Q6 hours  Inpatient Diabetes Program Recommendations:    -  Add Novolog hs scale -  Increase Novolog meal coverage to 6 units  Thanks,  Tama Headings RN, MSN, BC-ADM Inpatient Diabetes Coordinator Team Pager [phone] (8a-5p)

## 2020-10-19 NOTE — Procedures (Signed)
Patient Name: Benjamin Willis.  MRN: [id number]  Epilepsy Attending: Lora Havens  Referring Physician/Provider: Dr Fayrene Helper Date: 10/18/2020 Duration: 22.20 mins  Patient history: 75yo with lung cancer and mets to brain noted to have left arm shaking. EEG to evaluate for seizure  Level of alertness: Awake  AEDs during EEG study: LEV  Technical aspects: This EEG study was done with scalp electrodes positioned according to the 10-20 International system of electrode placement. Electrical activity was acquired at a sampling rate of 500Hz  and reviewed with a high frequency filter of 70Hz  and a low frequency filter of 1Hz . EEG data were recorded continuously and digitally stored.   Description: The posterior dominant rhythm consists of 8-9 Hz activity of moderate voltage (25-35 uV) seen predominantly in posterior head regions, symmetric and reactive to eye opening and eye closing.  Hyperventilation and photic stimulation were not performed.     IMPRESSION: This study is within normal limits. No seizures or epileptiform discharges were seen throughout the recording.  Kaislee Chao Barbra Sarks

## 2020-10-19 NOTE — TOC Progression Note (Signed)
Transition of Care (TOC) - Progression Note    Patient Details  Name: Benjamin Willis. MRN: [id number] Date of Birth: 26-May-1945  Transition of Care [facility]) CM/SW Contact  Leeroy Cha, RN Phone Number: 10/19/2020, 3:35 PM  Clinical Narrative:    For dcd tomorrow [phone] will go to the address in the system which is the sister's address.  Prefers that everyone call her.  [facility]-orders for [facility] rn pt ot aides and csw given to [place] with acceptance for service.   Expected Discharge Plan: [place] Barriers to Discharge: No Barriers Identified  Expected Discharge Plan and Services Expected Discharge Plan: [place]     Post Acute Care Choice: Home Health                             HH Arranged: RN, PT, OT, Nurse's Aide, Social Work [employer] Agency: [place] ([facility]) Date [place]: 10/19/20 Time [place]: [place] Representative spoke with at [place]: Ramond Marrow   Social Determinants of Health ([place]) Interventions    Readmission Risk Interventions No flowsheet data found.

## 2020-10-19 NOTE — Progress Notes (Addendum)
Physical Therapy Treatment Patient Details Name: Benjamin Willis. MRN: [id number] DOB: May 26, 1945 Today's Date: 10/19/2020    History of Present Illness Patient is a 75 year old male presenting with generalized weakness and a fall. 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 Patient starting brain radiation 6/15. PMH includes DM, HTN, HLD    PT Comments    Pt  tolerates BLE strengthening exercises, able to keep count with therapist assisting count and demonstration to improve motor control and decrease momentum. Pt ambulates in hallway, then additional ambulation in room to restroom and back to chair with RW, min guard for safety and steadying, continues with L hip drop and limp that pt states is baseline. Vcs for anterior weight shift with transfers. Pt continues to demonstrate difficulty transitioning L hand onto RW upright and from RW to grab bar in restroom requiring hand over hand assistance. Pt confirms plans to d/c home with sister and brother in law to assist. Pt tolerates remaining up in chair at [place].   Follow Up Recommendations  Home health PT;Supervision/Assistance - 24 hour     Equipment Recommendations  None recommended by PT    Recommendations for Other Services       Precautions / Restrictions Precautions Precautions: Fall Restrictions Weight Bearing Restrictions: No    Mobility  Bed Mobility Overal bed mobility: Needs Assistance Bed Mobility: Supine to Sit  Supine to sit: Min guard  General bed mobility comments: min G to upright trunk and scoot to EOB, increased time and VCs for hand placement    Transfers Overall transfer level: Needs assistance Equipment used: Rolling walker (2 wheeled) Transfers: Sit to/from [facility] Sit to Stand: Min guard Stand pivot transfers: Min assist  General transfer comment: min guard to rise from sitting, VCs for hand placement, min A for pivoting due to difficulty  transitioning L hand from RW to grab bar in restroom, requires physical assist to grab upright and transfer hand to grab bar  Ambulation/Gait Ambulation/Gait assistance: Min guard Gait Distance (Feet): 40 Feet (+ additional ~20 ft to/from restroom) Assistive device: Rolling walker (2 wheeled)   Gait velocity: decreased   General Gait Details: L hip drop with external rotation, trunk slightly flexed with step to pattern, VCs to maneuver RW with turns, min guard for steadying and safety, no knee buckling or LOB noted   Stairs             Wheelchair Mobility    Modified Rankin (Stroke Patients Only)       Balance Overall balance assessment: History of Falls;Needs assistance Sitting-balance support: Feet supported Sitting balance-Leahy Scale: Good Sitting balance - Comments: seated EOB   Standing balance support: Bilateral upper extremity supported;During functional activity Standing balance-Leahy Scale: Poor Standing balance comment: reliant on UE support           Cognition Arousal/Alertness: Awake/alert Behavior During Therapy: WFL for tasks assessed/performed Overall Cognitive Status: No family/caregiver present to determine baseline cognitive functioning  General Comments: Pt pleasant, making jokes during session, requires increased time to follow 1 step commands.      Exercises General Exercises - Lower Extremity Long Arc Quad: Seated;AROM;Strengthening;Both;15 reps Hip Flexion/Marching: Seated;AROM;Strengthening;Both;15 reps Other Exercises Other Exercises: STS, 10 reps from chair with RW    General Comments        Pertinent Vitals/Pain Pain Assessment: No/denies pain    Home Living  Prior Function            PT Goals (current goals can now be found in the care plan section) Acute Rehab PT Goals Patient Stated Goal: "take a little walk" PT Goal Formulation: With patient Time For Goal Achievement:  10/31/20 Potential to Achieve Goals: Good Progress towards PT goals: Progressing toward goals    Frequency    Min 3X/week      PT Plan Current plan remains appropriate    Co-evaluation              AM-PAC PT "6 Clicks" Mobility   Outcome Measure  Help needed turning from your back to your side while in a flat bed without using bedrails?: A Little Help needed moving from lying on your back to sitting on the side of a flat bed without using bedrails?: A Little Help needed moving to and from a bed to a chair (including a wheelchair)?: A Little Help needed standing up from a chair using your arms (e.g., wheelchair or bedside chair)?: A Little Help needed to walk in hospital room?: A Little Help needed climbing 3-5 steps with a railing? : A Lot 6 Click Score: 17    End of Session Equipment Utilized During Treatment: Gait belt Activity Tolerance: Patient tolerated treatment well Patient left: in chair;with call bell/phone within reach;with chair alarm set Nurse Communication: Mobility status;Other (comment) (BM) PT Visit Diagnosis: Unsteadiness on feet (R26.81);Other abnormalities of gait and mobility (R26.89);Muscle weakness (generalized) (M62.81)     Time: 3009-2330 PT Time Calculation (min) (ACUTE ONLY): 28 min  Charges:  $Gait Training: 8-22 mins $Therapeutic Exercise: 8-22 mins                      Tori Calais Svehla PT, DPT 10/19/20, 10:09 AM

## 2020-10-19 NOTE — Progress Notes (Signed)
PROGRESS NOTE    Benjamin Willis.  ERD:[id number] DOB: 1946/04/19 DOA: 10/15/2020 PCP: Horald Pollen, MD   Chief Complaint  Patient presents with   Weakness   Brief Narrative:  Benjamin Plouff. is Benjamin Willis 75 y.o. male with medical history significant of DM; HTN; HLD; and metastatic lung cancer presenting with weakness. He had Benjamin Willis screening CT with Benjamin Willis RUL nodule.  He was scheduled for thoracoscopy with wedge resection in 09/2018 but the patient canceled the procedure and was lost to follow up.  On repeat imaging in 01/2019 there was interval progression on imaging.   He had  Benjamin Willis bronch on 9/30 with confirmation of Stage 1A2 adenocarcinoma and initiated radiation therapy.  PET scan in 02/2020 showed 3 hypermetabolic RUL nodules that were c/w disease recurrence.  He was supposed to return for CT simulation on 02/20/20 and appears to have again been lost to follow-up.  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  He attended school through about 5th or 6th grade.  He reports that he came to the ER because his sister made him get off the couch and come in.  He fell sometime ago and appears to have been mostly confined to the couch recently due to generalized weakness.  He does report that he has [name] "long leg and [name] short leg" and the short (left) leg apparently gave way and caused him to fall.     Dr. Lorin Willis spoke with his sister and he lives near her.  He does have some learning challenges, does not read or write well.  With the COVID, she was not allowed to attend appointments with her brother and she was unaware of the issues.  His sister is interested in palliative care and Benjamin Willis services.  She says he "has Benjamin Willis zest for life" and thinks he would want resuscitation.    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  Unfortunately lost to follow up. - now with MRI brain showing four brain lesions consistent with metastases, extensive vasogenic edema associated with posterior right frontal lesion without midline shift - CT CAP with interval evolution of radiation friboris in the perihilar and suprhilar right lung.  Nodular component in the right upper central lobe is minimally decreased in size alothough more difficult to appreciate against background of developing fibrosis.  Findings c/w treatment response and expected post treatment appearance.  No evidence of LAD or metastatic disease in chest, abdomen, or pelvis.  - dexamethasone - oncology c/s, appreciate recs - radiation oncology c/s, appreciate assistance -Patient has apparent Intellectual Disability and does not appear to understand his illness; palliative care consultation has been requested.  Will discuss with sister. -PT/OT/TOC team consults also requested, as he appears likely to benefit from support at home vs. Placement   Left Arm Shaking  Concern for Partial Seizures - per RN, lasted about 10  min on 6/15  - in setting of above, concerning for partial seizure - EEG wnl - keppra - neurooncology, discussed over phone, plan for follow outpatient  DM -A1c 7.4 on recent check -hold Glucophage, Glucotrol, Januvia -Cover with moderate-scale SSI - mealtime insulin - on steroids above   HTN -Continue Norvasc, Lisinopril   HLD -Continue Crestor   Glaucoma -Continue Xalatan  DVT prophylaxis: SCD Code Status: full  Family Communication: none  at bedside - sister over phone 6/17 Disposition:   Status is: Inpatient  Remains inpatient appropriate because:Inpatient level of care appropriate due to severity of illness  Dispo: The patient is from: Home              Anticipated d/c is to: Home              Patient currently is not medically stable to d/c.   Difficult to place patient No       Consultants:  Oncology Neuro oncology Radiation oncology  Procedures:  none  Antimicrobials:  Anti-infectives (From admission, onward)    None       Subjective: No new complaints  Objective: Vitals:   10/18/20 1356 10/18/20 2008 10/19/20 0448 10/19/20 1340  BP: 135/73 139/63 107/66 134/77  Pulse: 62 70 (!) 58 (!) 55  Resp: 16 18 18 18   Temp: 98.1 F (36.7 C) 97.9 F (36.6 C) 97.6 F (36.4 C) 98.6 F (37 C)  TempSrc: Oral Oral Oral Oral  SpO2: 98% 97% 97% 99%  Weight:        Intake/Output Summary (Last 24 hours) at 10/19/2020 1506 Last data filed at 10/19/2020 0450 Gross per 24 hour  Intake 100 ml  Output 400 ml  Net -300 ml   Filed Weights   10/16/20 1610  Weight: 72.4 kg    Examination:  General: No acute distress. Cardiovascular: Heart sounds show Kenyette Gundy regular rate, and rhythm.  Lungs: Clear to auscultation bilaterally  Abdomen: Soft, nontender, nondistended  Neurological: Alert and oriented 3. Moves all extremities 4. Left sided weakness, 4/5.  Cranial nerves II through XII grossly intact. Skin: Warm and dry. No rashes or lesions. Extremities: No clubbing or cyanosis. No edema.     Data Reviewed: I have personally reviewed following labs and imaging studies  CBC: Recent Labs  Lab 10/15/20 2009 10/17/20 0659 10/18/20 0526 10/19/20 0540  WBC 4.6 6.9 11.9* 9.7  NEUTROABS 2.9  --  10.8* 8.7*  HGB 13.0 12.6* 12.6* 12.2*  HCT 41.1 38.6* 39.0 37.9*  MCV 96.0 94.8 96.3 95.7  PLT 245 249 254 453    Basic Metabolic Panel: Recent Labs  Lab 10/15/20 2009 10/17/20 0545 10/17/20 2203  10/18/20 0526 10/19/20 0540  NA 140 138  --  139 139  K 4.1 4.8  --  4.1 4.9  CL 106 105  --  105 107  CO2 25 24  --  25 26  GLUCOSE 118* 185*  --  184* 181*  BUN 11 20  --  23 24*  CREATININE 0.72 0.92  --  0.93 0.78  CALCIUM 9.2 9.2  --  9.2 8.8*  MG 2.2  --   --  2.3 2.3  PHOS  --   --  3.8 4.0  --     GFR: Estimated Creatinine Clearance: 74.2 mL/min (by C-G formula based on SCr of 0.78 mg/dL).  Liver Function Tests: Recent Labs  Lab 10/15/20 2009 10/18/20 0526 10/19/20 0540  AST 18 20 18   ALT 17 20  21  ALKPHOS 79 75 66  BILITOT 0.4 0.2* 0.6  PROT 6.9 6.8 6.1*  ALBUMIN 4.0 3.7 3.3*    CBG: Recent Labs  Lab 10/18/20 1152 10/18/20 1800 10/18/20 2140 10/19/20 0720 10/19/20 1145  GLUCAP 229* 212* 348* 224* 254*     Recent Results (from the past 240 hour(s))  Resp Panel by RT-PCR (Flu Mellina Benison&B, Covid) Nasopharyngeal Swab     Status: None   Collection Time: 10/11/20  2:55 PM   Specimen: Nasopharyngeal Swab; Nasopharyngeal(NP) swabs in vial transport medium  Result Value Ref Range Status   SARS Coronavirus 2 by RT PCR NEGATIVE NEGATIVE Final    Comment: (NOTE) SARS-CoV-2 target nucleic acids are NOT DETECTED.  The SARS-CoV-2 RNA is generally detectable in upper respiratory specimens during the acute phase of infection. The lowest concentration of SARS-CoV-2 viral copies this assay can detect is 138 copies/mL. Careen Mauch negative result does not preclude SARS-Cov-2 infection and should not be used as the sole basis for treatment or other patient management decisions. Isabella Roemmich negative result may occur with  improper specimen collection/handling, submission of specimen other than nasopharyngeal swab, presence of viral mutation(s) within the areas targeted by this assay, and inadequate number of viral copies(<138 copies/mL). Anabell Swint negative result must be combined with clinical observations, patient history, and epidemiological information. The expected result is Negative.  Fact  Sheet for Patients:  EntrepreneurPulse.com.au  Fact Sheet for Healthcare Providers:  IncredibleEmployment.be  This test is no t yet approved or cleared by the [place] FDA and  has been authorized for detection and/or diagnosis of SARS-CoV-2 by FDA under an Emergency Use Authorization (EUA). This EUA will remain  in effect (meaning this test can be used) for the duration of the COVID-19 declaration under Section 564(b)(1) of the Act, 21 U.S.C.section 360bbb-3(b)(1), unless the authorization is terminated  or revoked sooner.       Influenza Elleah Hemsley by PCR NEGATIVE NEGATIVE Final   Influenza B by PCR NEGATIVE NEGATIVE Final    Comment: (NOTE) The Xpert Xpress SARS-CoV-2/FLU/RSV plus assay is intended as an aid in the diagnosis of influenza from Nasopharyngeal swab specimens and should not be used as Kristianna Saperstein sole basis for treatment. Nasal washings and aspirates are unacceptable for Xpert Xpress SARS-CoV-2/FLU/RSV testing.  Fact Sheet for Patients: EntrepreneurPulse.com.au  Fact Sheet for Healthcare Providers: IncredibleEmployment.be  This test is not yet approved or cleared by the [place] FDA and has been authorized for detection and/or diagnosis of SARS-CoV-2 by FDA under an Emergency Use Authorization (EUA). This EUA will remain in effect (meaning this test can be used) for the duration of the COVID-19 declaration under Section 564(b)(1) of the Act, 21 U.S.C. section 360bbb-3(b)(1), unless the authorization is terminated or revoked.  Performed at [facility], [place] [address], [place], [place] [address]   SARS CORONAVIRUS 2 (TAT 6-24 HRS) Nasopharyngeal Nasopharyngeal Swab     Status: None   Collection Time: 10/16/20  7:42 AM   Specimen: Nasopharyngeal Swab  Result Value Ref Range Status   SARS Coronavirus 2 NEGATIVE NEGATIVE Final    Comment: (NOTE) SARS-CoV-2 target nucleic acids are NOT  DETECTED.  The SARS-CoV-2 RNA is generally detectable in upper and lower respiratory specimens during the acute phase of infection. Negative results do not preclude SARS-CoV-2 infection, do not rule out co-infections with other pathogens, and should not be used as the sole basis for treatment or other patient management decisions. Negative results must be combined with clinical observations, patient history, and  epidemiological information. The expected result is Negative.  Fact Sheet for Patients: SugarRoll.be  Fact Sheet for Healthcare Providers: https://www.woods-mathews.com/  This test is not yet approved or cleared by the [place] FDA and  has been authorized for detection and/or diagnosis of SARS-CoV-2 by FDA under an Emergency Use Authorization (EUA). This EUA will remain  in effect (meaning this test can be used) for the duration of the COVID-19 declaration under Se ction 564(b)(1) of the Act, 21 U.S.C. section 360bbb-3(b)(1), unless the authorization is terminated or revoked sooner.  Performed at [facility], [place] [address] [place], [place] [address]          Radiology Studies: No results found.      Scheduled Meds:  allopurinol  100 mg Oral Daily   amLODipine  10 mg Oral Daily   dexamethasone (DECADRON) injection  4 mg Intravenous Q6H   docusate sodium  100 mg Oral BID   insulin aspart  0-15 Units Subcutaneous TID WC   insulin aspart  3 Units Subcutaneous TID WC   latanoprost  1 drop Both Eyes QHS   lisinopril  40 mg Oral Daily   rosuvastatin  20 mg Oral QHS   Continuous Infusions:  levETIRAcetam 1,000 mg (10/19/20 0455)     LOS: 3 days    Time spent: over 30 min    Fayrene Helper, MD [facility]   To contact the attending provider between 7A-7P or the covering provider during after hours 7P-7A, please log into the web site [email or web address].amion.com and access using universal [place]  password for that web site. If you do not have the password, please call the hospital operator.  10/19/2020, 3:06 PM

## 2020-10-20 DIAGNOSIS — C3490 Malignant neoplasm of unspecified part of unspecified bronchus or lung: Secondary | ICD-10-CM | POA: Diagnosis not present

## 2020-10-20 LAB — CBC WITH DIFFERENTIAL/PLATELET
Abs Immature Granulocytes: 0.06 10*3/uL (ref 0.00–0.07)
Basophils Absolute: 0 10*3/uL (ref 0.0–0.1)
Basophils Relative: 0 %
Eosinophils Absolute: 0 10*3/uL (ref 0.0–0.5)
Eosinophils Relative: 0 %
HCT: 38.8 % — ABNORMAL LOW (ref 39.0–52.0)
Hemoglobin: 12.7 g/dL — ABNORMAL LOW (ref 13.0–17.0)
Immature Granulocytes: 1 %
Lymphocytes Relative: 6 %
Lymphs Abs: 0.6 10*3/uL — ABNORMAL LOW (ref 0.7–4.0)
MCH: 30.9 pg (ref 26.0–34.0)
MCHC: 32.7 g/dL (ref 30.0–36.0)
MCV: 94.4 fL (ref 80.0–100.0)
Monocytes Absolute: 0.5 10*3/uL (ref 0.1–1.0)
Monocytes Relative: 5 %
Neutro Abs: 8.7 10*3/uL — ABNORMAL HIGH (ref 1.7–7.7)
Neutrophils Relative %: 88 %
Platelets: 232 10*3/uL (ref 150–400)
RBC: 4.11 MIL/uL — ABNORMAL LOW (ref 4.22–5.81)
RDW: 12 % (ref 11.5–15.5)
WBC: 9.9 10*3/uL (ref 4.0–10.5)
nRBC: 0 % (ref 0.0–0.2)

## 2020-10-20 LAB — COMPREHENSIVE METABOLIC PANEL
ALT: 31 U/L (ref 0–44)
AST: 21 U/L (ref 15–41)
Albumin: 3.4 g/dL — ABNORMAL LOW (ref 3.5–5.0)
Alkaline Phosphatase: 66 U/L (ref 38–126)
Anion gap: 7 (ref 5–15)
BUN: 23 mg/dL (ref 8–23)
CO2: 28 mmol/L (ref 22–32)
Calcium: 8.8 mg/dL — ABNORMAL LOW (ref 8.9–10.3)
Chloride: 104 mmol/L (ref 98–111)
Creatinine, Ser: 0.81 mg/dL (ref 0.61–1.24)
GFR, Estimated: >60 mL/min (ref >60)
Glucose, Bld: 177 mg/dL — ABNORMAL HIGH (ref 70–99)
Potassium: 4.5 mmol/L (ref 3.5–5.1)
Sodium: 139 mmol/L (ref 135–145)
Total Bilirubin: 0.5 mg/dL (ref 0.3–1.2)
Total Protein: 6.2 g/dL — ABNORMAL LOW (ref 6.5–8.1)

## 2020-10-20 LAB — MAGNESIUM: Magnesium: 2.2 mg/dL (ref 1.7–2.4)

## 2020-10-20 LAB — GLUCOSE, CAPILLARY
Glucose-Capillary: 166 mg/dL — ABNORMAL HIGH (ref 70–99)
Glucose-Capillary: 209 mg/dL — ABNORMAL HIGH (ref 70–99)
Glucose-Capillary: 198 mg/dL — ABNORMAL HIGH (ref 70–99)
Glucose-Capillary: 312 mg/dL — ABNORMAL HIGH (ref 70–99)

## 2020-10-20 LAB — PHOSPHORUS: Phosphorus: 4.3 mg/dL (ref 2.5–4.6)

## 2020-10-20 NOTE — TOC Progression Note (Signed)
Transition of Care (TOC) - Progression Note   Patient Details  Name: Benjamin Willis. MRN: [id number] Date of Birth: 11/22/1945  Transition of Care [facility]) CM/SW [place], LCSW Phone Number: 10/20/2020, 12:29 PM  Clinical Narrative: [facility] received consult for outpatient palliative services. CSW spoke with patient's sister, Benjamin Willis, who is agreeable to referral to [place]. CSW made palliative referral to [facility] with [facility]. Palliative and HH services added to AVS. TOC to follow.  Expected Discharge Plan: [place] Barriers to Discharge: No Barriers Identified  Expected Discharge Plan and Services Expected Discharge Plan: [place] Post Acute Care Choice: Home Health HH Arranged: RN, PT, OT, Nurse's Aide, Social Work [employer] Agency: [place] ([facility]) Date [place]: 10/19/20 Time [place]: [place] Representative spoke with at [place]: Ramond Marrow  Readmission Risk Interventions No flowsheet data found.

## 2020-10-20 NOTE — Progress Notes (Signed)
[id number]  [facility]) Hospital Liaison RN Note:  Notified by Tobi Bastos, LCSW [facility] manager of patient/family request for [facility] Palliative services at home after discharge.  [facility] hospital liaison will follow patient for discharge disposition.   Please call with any outpatient palliative care related questions.   Thank you for the opportunity to participate in this patient's care.   Bobbie "Loren Racer, RN, BSN [facility] Liaison [phone]

## 2020-10-20 NOTE — Progress Notes (Signed)
PROGRESS NOTE    Benjamin Willis.  OZH:[id number] DOB: 1945/05/28 DOA: 10/15/2020 PCP: Horald Pollen, MD   Chief Complaint  Patient presents with   Weakness   Brief Narrative:  Benjamin Willis. is Benjamin Willis 75 y.o. male with medical history significant of DM; HTN; HLD; and metastatic lung cancer presenting with weakness. He had Tambria Pfannenstiel screening CT with [place] RUL nodule.  He was scheduled for thoracoscopy with wedge resection in 09/2018 but the patient canceled the procedure and was lost to follow up.  On repeat imaging in 01/2019 there was interval progression on imaging.   He had  Hema Lanza bronch on 9/30 with confirmation of Stage 1A2 adenocarcinoma and initiated radiation therapy.  PET scan in 02/2020 showed 3 hypermetabolic RUL nodules that were c/w disease recurrence.  He was supposed to return for CT simulation on 02/20/20 and appears to have again been lost to follow-up.  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  He attended school through about 5th or 6th grade.  He reports that he came to the ER because his sister made him get off the couch and come in.  He fell sometime ago and appears to have been mostly confined to the couch recently due to generalized weakness.  He does report that he has Benjamin Willis "long leg and Benjamin Willis short leg" and the short (left) leg apparently gave way and caused him to fall.     [name] spoke with his sister and he lives near her.  He does have some learning challenges, does not read or write well.  With the COVID, she was not allowed to attend appointments with her brother and she was unaware of the issues.  His sister is interested in palliative care and [facility] services.  She says he "has Benjamin Willis zest for life" and thinks he would want resuscitation.    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  Unfortunately lost to follow up. - now with MRI brain showing four brain lesions consistent with metastases, extensive vasogenic edema associated with posterior right frontal lesion without midline shift - CT CAP with interval evolution of radiation friboris in the perihilar and suprhilar right lung.  Nodular component in the right upper central lobe is minimally decreased in size alothough more difficult to appreciate against background of developing fibrosis.  Findings c/w treatment response and expected post treatment appearance.  No evidence of LAD or metastatic disease in chest, abdomen, or pelvis.  - dexamethasone - oncology c/s, appreciate recs - radiation oncology c/s, appreciate assistance -Patient has apparent Intellectual Disability and does not appear to understand his illness; palliative care consultation has been requested.  Will discuss with sister. -PT/OT/TOC team consults also requested, as he appears likely to benefit from support at home vs. Placement   Left Arm Shaking  Concern for Partial Seizures - per RN, lasted about 10  min on 6/15  - in setting of above, concerning for partial seizure - EEG wnl - keppra - neurooncology, discussed over phone, plan for follow outpatient  DM -A1c 7.4 on recent check -hold Glucophage, Glucotrol, Januvia -Cover with moderate-scale SSI - mealtime insulin - on steroids above   HTN -Continue Norvasc, Lisinopril   HLD -Continue Crestor   Glaucoma -Continue Xalatan  DVT prophylaxis: SCD Code Status: full  Family Communication:  sister at bedside Disposition:   Status is: Inpatient  Remains inpatient appropriate because:Inpatient level of care appropriate due to severity of illness  Dispo: The patient is from: Home              Anticipated d/c is to: Home              Patient currently is not medically stable to d/c.   Difficult to place patient No       Consultants:  Oncology Neuro oncology Radiation oncology  Procedures:  none  Antimicrobials:  Anti-infectives (From admission, onward)    None       Subjective: Feels weak in general  Objective: Vitals:   10/19/20 1340 10/19/20 2151 10/20/20 0628 10/20/20 1252  BP: 134/77 124/64 135/74 114/62  Pulse: (!) 55 (!) 43 (!) 59 (!) 53  Resp: 18 17 17 18   Temp: 98.6 F (37 C) 97.9 F (36.6 C) 98 F (36.7 C) 98.9 F (37.2 C)  TempSrc: Oral Oral Oral Oral  SpO2: 99% 97% 100% 98%  Weight:        Intake/Output Summary (Last 24 hours) at 10/20/2020 1454 Last data filed at 10/20/2020 0900 Gross per 24 hour  Intake 240 ml  Output 200 ml  Net 40 ml   Filed Weights   10/16/20 1610  Weight: 72.4 kg    Examination:  General: No acute distress. Cardiovascular: RRR Lungs: unlabored Abdomen: Soft, nontender, nondistended  Neurological: Alert and oriented 3. Moves all extremities 4 - R sided weakness. Cranial nerves II through XII grossly intact. Skin: Warm and dry. No rashes or lesions. Extremities: No clubbing or cyanosis. No edema.    Data Reviewed: I have personally reviewed following labs and imaging studies  CBC: Recent Labs  Lab 10/15/20 2009 10/17/20 0659 10/18/20 0526 10/19/20 0540 10/20/20 0728  WBC 4.6 6.9 11.9* 9.7 9.9  NEUTROABS 2.9  --  10.8* 8.7* 8.7*  HGB 13.0 12.6* 12.6* 12.2* 12.7*  HCT 41.1 38.6* 39.0 37.9* 38.8*  MCV 96.0 94.8 96.3 95.7 94.4  PLT 245 249 254 240 563    Basic Metabolic Panel: Recent Labs  Lab 10/15/20 2009 10/17/20 0545 10/17/20 2203 10/18/20 0526 10/19/20 0540 10/20/20 0728  NA  140 138  --  139 139 139  K 4.1 4.8  --  4.1 4.9 4.5  CL 106 105  --  105 107 104  CO2 25 24  --  25 26 28   GLUCOSE 118* 185*  --  184* 181* 177*  BUN 11 20  --  23 24* 23  CREATININE 0.72 0.92  --  0.93 0.78 0.81  CALCIUM 9.2 9.2  --  9.2 8.8* 8.8*  MG 2.2  --   --  2.3 2.3 2.2  PHOS  --   --  3.8 4.0  --  4.3    GFR: Estimated Creatinine Clearance: 73.3 mL/min (by C-G formula based on SCr of 0.81 mg/dL).  Liver Function Tests: Recent Labs  Lab 10/15/20 2009 10/18/20 0526 10/19/20 0540 10/20/20 0728  AST 18  20 18 21   ALT 17 20 21 31   ALKPHOS 79 75 66 66  BILITOT 0.4 0.2* 0.6 0.5  PROT 6.9 6.8 6.1* 6.2*  ALBUMIN 4.0 3.7 3.3* 3.4*    CBG: Recent Labs  Lab 10/19/20 1145 10/19/20 1755 10/19/20 2148 10/20/20 0730 10/20/20 1214  GLUCAP 254* 214* 114* 166* 209*     Recent Results (from the past 240 hour(s))  Resp Panel by RT-PCR (Flu Rehmat Murtagh&B, Covid) Nasopharyngeal Swab     Status: None   Collection Time: 10/11/20  2:55 PM   Specimen: Nasopharyngeal Swab; Nasopharyngeal(NP) swabs in vial transport medium  Result Value Ref Range Status   SARS Coronavirus 2 by RT PCR NEGATIVE NEGATIVE Final    Comment: (NOTE) SARS-CoV-2 target nucleic acids are NOT DETECTED.  The SARS-CoV-2 RNA is generally detectable in upper respiratory specimens during the acute phase of infection. The lowest concentration of SARS-CoV-2 viral copies this assay can detect is 138 copies/mL. Michalla Ringer negative result does not preclude SARS-Cov-2 infection and should not be used as the sole basis for treatment or other patient management decisions. Darrelle Wiberg negative result may occur with  improper specimen collection/handling, submission of specimen other than nasopharyngeal swab, presence of viral mutation(s) within the areas targeted by this assay, and inadequate number of viral copies(<138 copies/mL). Yulian Gosney negative result must be combined with clinical observations, patient history, and epidemiological information.  The expected result is Negative.  Fact Sheet for Patients:  EntrepreneurPulse.com.au  Fact Sheet for Healthcare Providers:  IncredibleEmployment.be  This test is no t yet approved or cleared by the [place] FDA and  has been authorized for detection and/or diagnosis of SARS-CoV-2 by FDA under an Emergency Use Authorization (EUA). This EUA will remain  in effect (meaning this test can be used) for the duration of the COVID-19 declaration under Section 564(b)(1) of the Act, 21 U.S.C.section 360bbb-3(b)(1), unless the authorization is terminated  or revoked sooner.       Influenza Sarp Vernier by PCR NEGATIVE NEGATIVE Final   Influenza B by PCR NEGATIVE NEGATIVE Final    Comment: (NOTE) The Xpert Xpress SARS-CoV-2/FLU/RSV plus assay is intended as an aid in the diagnosis of influenza from Nasopharyngeal swab specimens and should not be used as Gaye Scorza sole basis for treatment. Nasal washings and aspirates are unacceptable for Xpert Xpress SARS-CoV-2/FLU/RSV testing.  Fact Sheet for Patients: EntrepreneurPulse.com.au  Fact Sheet for Healthcare Providers: IncredibleEmployment.be  This test is not yet approved or cleared by the [place] FDA and has been authorized for detection and/or diagnosis of SARS-CoV-2 by FDA under an Emergency Use Authorization (EUA). This EUA will remain in effect (meaning this test can be used) for the duration of the COVID-19 declaration under Section 564(b)(1) of the Act, 21 U.S.C. section 360bbb-3(b)(1), unless the authorization is terminated or revoked.  Performed at [facility], [place] [address] [place], [place] [address]   SARS CORONAVIRUS 2 (TAT 6-24 HRS) Nasopharyngeal Nasopharyngeal Swab     Status: None   Collection Time: 10/16/20  7:42 AM   Specimen: Nasopharyngeal Swab  Result Value Ref Range Status   SARS Coronavirus 2 NEGATIVE NEGATIVE Final    Comment:  (NOTE) SARS-CoV-2 target nucleic acids are NOT DETECTED.  The SARS-CoV-2 RNA is generally detectable in upper and lower respiratory specimens during the acute phase of infection. Negative results do not preclude SARS-CoV-2 infection, do not rule out co-infections with other pathogens, and should not be used as the sole basis for treatment or other patient management  decisions. Negative results must be combined with clinical observations, patient history, and epidemiological information. The expected result is Negative.  Fact Sheet for Patients: SugarRoll.be  Fact Sheet for Healthcare Providers: https://www.woods-mathews.com/  This test is not yet approved or cleared by the [place] FDA and  has been authorized for detection and/or diagnosis of SARS-CoV-2 by FDA under an Emergency Use Authorization (EUA). This EUA will remain  in effect (meaning this test can be used) for the duration of the COVID-19 declaration under Se ction 564(b)(1) of the Act, 21 U.S.C. section 360bbb-3(b)(1), unless the authorization is terminated or revoked sooner.  Performed at [facility], [place] [address], [place], [place] [address]          Radiology Studies: No results found.      Scheduled Meds:  allopurinol  100 mg Oral Daily   amLODipine  10 mg Oral Daily   dexamethasone  4 mg Oral Q6H   docusate sodium  100 mg Oral BID   insulin aspart  0-15 Units Subcutaneous TID WC   insulin aspart  3 Units Subcutaneous TID WC   latanoprost  1 drop Both Eyes QHS   lisinopril  40 mg Oral Daily   rosuvastatin  20 mg Oral QHS   Continuous Infusions:  levETIRAcetam 1,000 mg (10/20/20 0457)     LOS: 4 days    Time spent: over 30 min    Fayrene Helper, MD [facility]   To contact the attending provider between 7A-7P or the covering provider during after hours 7P-7A, please log into the web site [email or web address] and access using  universal [place] password for that web site. If you do not have the password, please call the hospital operator.  10/20/2020, 2:54 PM

## 2020-10-20 NOTE — Plan of Care (Signed)
Pt spent a very comfortable night sleeping; he reports no s/s of acute distress or pain this am, he is happy to have slept so well. Call light within reach and bed at lowest position for safety.

## 2020-10-20 NOTE — Progress Notes (Signed)
Physical Therapy Treatment Patient Details Name: Benjamin Willis. MRN: [id number] DOB: 04-27-1946 Today's Date: 10/20/2020    History of Present Illness Patient is a 75 year old male presenting with generalized weakness and a fall. 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 Patient starting brain radiation 6/15. PMH includes DM, HTN, HLD    PT Comments    Pt is a hard worker during out session and very pleasant to work with. Presenting like CVA with L sided weakness and decreased awareness of Left side. Performed neuro re education in sitting exercises, and also cues during mobility to have awareness on LEft side and attention to midline. Pt with L LE and LE arm weakness and inattention. Pt stated he has flight of stairs to egress at his sisters house to gt ot the bedroom. At this time this would not be a safe task for him to perform. Today he would need min/Mod A for mobility and safety cues however due to his diagnosis unsure of his progression and wil bhe need more assist sooner than later requiring a level entry and maybe W/C evel. Tried to call sister but was able to reach her .   Will continue to follow in ace care, but need to know more about his prognosis and timing of progression in order to best prepare family assisting this patient.     Follow Up Recommendations  Home health PT;Supervision/Assistance - 24 hour     Equipment Recommendations  Rolling walker with 5" wheels (may also need WC due to progressive nature of his prognosis)    Recommendations for Other Services       Precautions / Restrictions Precautions Precautions: Fall    Mobility  Bed Mobility Overal bed mobility: Needs Assistance Bed Mobility: Supine to Sit     Supine to sit: Min assist Sit to supine: Min assist   General bed mobility comments: Min A and cues for use of L UE , had some L sided neglect and in attention to where L hand was and how he was placing it.  Worked at sitting bedside using L hand palm down for oushing and weight bearing activities. Lateral trunk leans R and Left for trunk intiation and centering midline activities and L weightbearing nad pushing from Le lateral lean    Transfers Overall transfer level: Needs assistance Equipment used: Rolling walker (2 wheeled) Transfers: Sit to/from Stand Sit to Stand: Min assist Stand pivot transfers: Min assist       General transfer comment: VC for L hand placment and awareness for use on RW. Still with grip turnign into supination wihtout focus and cues for L hand proeor use and placement  Ambulation/Gait Ambulation/Gait assistance: Min assist;+2 safety/equipment Gait Distance (Feet): 50 Feet Assistive device: Rolling walker (2 wheeled)   Gait velocity: decreased   General Gait Details: Difficulty with L foot clearance and coiordination, and cues and assit for L hand grip. Very slow gait but able to follow commnads and change L foot to heel strike and increase foot clearnce with cues.   Stairs             Wheelchair Mobility    Modified Rankin (Stroke Patients Only)       Balance Overall balance assessment: History of Falls;Needs assistance Sitting-balance support: Feet supported Sitting balance-Leahy Scale: Good Sitting balance - Comments: seated EOB   Standing balance support: Bilateral upper extremity supported;During functional activity Standing balance-Leahy Scale: Poor Standing balance comment: reliant  on UE support                            Cognition Arousal/Alertness: Awake/alert Behavior During Therapy: WFL for tasks assessed/performed Overall Cognitive Status: No family/caregiver present to determine baseline cognitive functioning                                 General Comments: Pt pleasant, making jokes during session, requires increased time to follow 1 step commands.      Exercises Other Exercises Other Exercises:  worked in sitting with R and left lateral leans for forearm weight bearing and pushing off to help pt with midline centering and also with L UE weight bearing and awareness and trunk intitation Other Exercises: worked in sitting with LE foot tapping for strengthening and coordination exercises and also knee extensions in sitting Other Exercises: educated with L UE awareness and placignon pillow so she is aware where it is in bed.    General Comments        Pertinent Vitals/Pain Pain Assessment: No/denies pain    Home Living                      Prior Function            PT Goals (current goals can now be found in the care plan section) Acute Rehab PT Goals Patient Stated Goal: "take a little walk" PT Goal Formulation: With patient Time For Goal Achievement: 10/31/20 Potential to Achieve Goals: Good Progress towards PT goals: Progressing toward goals    Frequency    Min 3X/week      PT Plan Current plan remains appropriate    Co-evaluation              AM-PAC PT "6 Clicks" Mobility   Outcome Measure  Help needed turning from your back to your side while in a flat bed without using bedrails?: A Little Help needed moving from lying on your back to sitting on the side of a flat bed without using bedrails?: A Little Help needed moving to and from a bed to a chair (including a wheelchair)?: A Little Help needed standing up from a chair using your arms (e.g., wheelchair or bedside chair)?: A Lot Help needed to walk in hospital room?: A Lot Help needed climbing 3-5 steps with a railing? : A Lot 6 Click Score: 15    End of Session Equipment Utilized During Treatment: Gait belt Activity Tolerance: Patient tolerated treatment well Patient left: in bed;with bed alarm set Nurse Communication: Mobility status;Other (comment) PT Visit Diagnosis: Unsteadiness on feet (R26.81);Other abnormalities of gait and mobility (R26.89);Muscle weakness (generalized) (M62.81)      Time: 1420-1500 PT Time Calculation (min) (ACUTE ONLY): 40 min  Charges:  $Gait Training: 8-22 mins $Therapeutic Activity: 8-22 mins $Neuromuscular Re-education: 8-22 mins                     Rony Ratz, PT, MPT Acute Rehabilitation Services Office: [phone] Pager: [phone] 10/20/2020    Clide Dales 10/20/2020, 6:42 PM

## 2020-10-21 DIAGNOSIS — C3490 Malignant neoplasm of unspecified part of unspecified bronchus or lung: Secondary | ICD-10-CM | POA: Diagnosis not present

## 2020-10-21 LAB — CBC WITH DIFFERENTIAL/PLATELET
Abs Immature Granulocytes: 0.05 10*3/uL (ref 0.00–0.07)
Basophils Absolute: 0 10*3/uL (ref 0.0–0.1)
Basophils Relative: 0 %
Eosinophils Absolute: 0 10*3/uL (ref 0.0–0.5)
Eosinophils Relative: 0 %
HCT: 38 % — ABNORMAL LOW (ref 39.0–52.0)
Hemoglobin: 12.4 g/dL — ABNORMAL LOW (ref 13.0–17.0)
Immature Granulocytes: 1 %
Lymphocytes Relative: 6 %
Lymphs Abs: 0.4 10*3/uL — ABNORMAL LOW (ref 0.7–4.0)
MCH: 31 pg (ref 26.0–34.0)
MCHC: 32.6 g/dL (ref 30.0–36.0)
MCV: 95 fL (ref 80.0–100.0)
Monocytes Absolute: 0.4 10*3/uL (ref 0.1–1.0)
Monocytes Relative: 6 %
Neutro Abs: 6 10*3/uL (ref 1.7–7.7)
Neutrophils Relative %: 87 %
Platelets: 205 10*3/uL (ref 150–400)
RBC: 4 MIL/uL — ABNORMAL LOW (ref 4.22–5.81)
RDW: 11.8 % (ref 11.5–15.5)
WBC: 6.9 10*3/uL (ref 4.0–10.5)
nRBC: 0 % (ref 0.0–0.2)

## 2020-10-21 LAB — COMPREHENSIVE METABOLIC PANEL
ALT: 55 U/L — ABNORMAL HIGH (ref 0–44)
AST: 34 U/L (ref 15–41)
Albumin: 3.1 g/dL — ABNORMAL LOW (ref 3.5–5.0)
Alkaline Phosphatase: 60 U/L (ref 38–126)
Anion gap: 4 — ABNORMAL LOW (ref 5–15)
BUN: 24 mg/dL — ABNORMAL HIGH (ref 8–23)
CO2: 28 mmol/L (ref 22–32)
Calcium: 8.4 mg/dL — ABNORMAL LOW (ref 8.9–10.3)
Chloride: 105 mmol/L (ref 98–111)
Creatinine, Ser: 0.84 mg/dL (ref 0.61–1.24)
GFR, Estimated: >60 mL/min (ref >60)
Glucose, Bld: 216 mg/dL — ABNORMAL HIGH (ref 70–99)
Potassium: 4.5 mmol/L (ref 3.5–5.1)
Sodium: 137 mmol/L (ref 135–145)
Total Bilirubin: 0.5 mg/dL (ref 0.3–1.2)
Total Protein: 5.6 g/dL — ABNORMAL LOW (ref 6.5–8.1)

## 2020-10-21 LAB — GLUCOSE, CAPILLARY
Glucose-Capillary: 192 mg/dL — ABNORMAL HIGH (ref 70–99)
Glucose-Capillary: 304 mg/dL — ABNORMAL HIGH (ref 70–99)
Glucose-Capillary: 181 mg/dL — ABNORMAL HIGH (ref 70–99)
Glucose-Capillary: 254 mg/dL — ABNORMAL HIGH (ref 70–99)

## 2020-10-21 LAB — PHOSPHORUS: Phosphorus: 3.9 mg/dL (ref 2.5–4.6)

## 2020-10-21 LAB — MAGNESIUM: Magnesium: 2.2 mg/dL (ref 1.7–2.4)

## 2020-10-21 MED ORDER — PANTOPRAZOLE SODIUM 40 MG PO TBEC
40.0000 mg | DELAYED_RELEASE_TABLET | Freq: Every day | ORAL | Status: DC
  Administered 2020-10-21 – 2020-10-23 (×3): 40 mg via ORAL
  Filled 2020-10-21 (×3): qty 1

## 2020-10-21 NOTE — Progress Notes (Signed)
PROGRESS NOTE    Benjamin Willis.  QIH:[id number] DOB: Sep 09, 1945 DOA: 10/15/2020 PCP: Horald Pollen, MD   Chief Complaint  Patient presents with   Weakness   Brief Narrative:  Benjamin Gallicchio. is Benjamin Willis 75 y.o. male with medical history significant of DM; HTN; HLD; and metastatic lung cancer presenting with weakness. He had Benjamin Willis screening CT with Benjamin Willis RUL nodule.  He was scheduled for thoracoscopy with wedge resection in 09/2018 but the patient canceled the procedure and was lost to follow up.  On repeat imaging in 01/2019 there was interval progression on imaging.   He had  Benjamin Willis bronch on 9/30 with confirmation of Stage 1A2 adenocarcinoma and initiated radiation therapy.  PET scan in 02/2020 showed 3 hypermetabolic RUL nodules that were c/w disease recurrence.  He was supposed to return for CT simulation on 02/20/20 and appears to have again been lost to follow-up.  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  He attended school through about 5th or 6th grade.  He reports that he came to the ER because his sister made him get off the couch and come in.  He fell sometime ago and appears to have been mostly confined to the couch recently due to generalized weakness.  He does report that he has Benjamin Willis "long leg and Benjamin Willis short leg" and the short (left) leg apparently gave way and caused him to fall.     Benjamin Willis spoke with his sister and he lives near her.  He does have some learning challenges, does not read or write well.  With the COVID, she was not allowed to attend appointments with her brother and she was unaware of the issues.  His sister is interested in palliative care and [facility] services.  She says he "has Kaelan Emami zest for life" and thinks he would want resuscitation.    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  Unfortunately lost to follow up. - now with MRI brain showing four brain lesions consistent with metastases, extensive vasogenic edema associated with posterior right frontal lesion without midline shift - CT CAP with interval evolution of radiation friboris in the perihilar and suprhilar right lung.  Nodular component in the right upper central lobe is minimally decreased in size alothough more difficult to appreciate against background of developing fibrosis.  Findings c/w treatment response and expected post treatment appearance.  No evidence of LAD or metastatic disease in chest, abdomen, or pelvis.  - dexamethasone - oncology c/s, appreciate recs - radiation oncology c/s, appreciate assistance -Patient has apparent Intellectual Disability and does not appear to understand his illness; palliative care consultation has been requested.  Will discuss with sister. -PT/OT/TOC team consults also requested, as he appears likely to benefit from support at home vs. Placement   Left Arm Shaking  Concern for Partial Seizures - per RN, lasted about 10  min on 6/15  - in setting of above, concerning for partial seizure - EEG wnl - keppra - neurooncology, discussed over phone, plan for follow outpatient  Hiccups Plan for PPI Consider adding gabapentin if not improving  DM -A1c 7.4 on recent check -hold Glucophage, Glucotrol, Januvia -Cover with moderate-scale SSI - mealtime insulin - on steroids above   HTN -Continue Norvasc, Lisinopril   HLD -Continue Crestor   Glaucoma -Continue  Xalatan  DVT prophylaxis: SCD Code Status: full  Family Communication: none  at bedside Disposition:   Status is: Inpatient  Remains inpatient appropriate because:Inpatient level of care appropriate due to severity of illness  Dispo: The patient is from: Home              Anticipated d/c is to: Home              Patient currently is not medically stable to d/c.   Difficult to place patient No       Consultants:  Oncology Neuro oncology Radiation oncology  Procedures:  none  Antimicrobials:  Anti-infectives (From admission, onward)    None       Subjective: C/o hiccups today  Objective: Vitals:   10/20/20 1252 10/20/20 2031 10/20/20 2100 10/21/20 0605  BP: 114/62 126/75  136/71  Pulse: (!) 53 (!) 57  (!) 50  Resp: 18 18  17   Temp: 98.9 F (37.2 C) 98.1 F (36.7 C)  98.2 F (36.8 C)  TempSrc: Oral Oral  Oral  SpO2: 98% 97%  98%  Weight:      Height:   5\' 6"  (1.676 m)     Intake/Output Summary (Last 24 hours) at 10/21/2020 1208 Last data filed at 10/21/2020 0858 Gross per 24 hour  Intake 360 ml  Output 425 ml  Net -65 ml   Filed Weights   10/16/20 1610  Weight: 72.4 kg    Examination:  General: No acute distress. Frequent hiccups. Cardiovascular: RRR Lungs: Clear to auscultation bilaterally  Abdomen: Soft, nontender, nondistended Neurological: Alert and oriented 3. Moves all extremities 4. Cranial nerves II through XII grossly intact. Skin: Warm and dry. No rashes or lesions. Extremities: No clubbing or cyanosis. No edema.    Data Reviewed: I have personally reviewed following labs and imaging studies  CBC: Recent Labs  Lab 10/15/20 2009 10/17/20 0659 10/18/20 0526 10/19/20 0540 10/20/20 0728 10/21/20 0648  WBC 4.6 6.9 11.9* 9.7 9.9 6.9  NEUTROABS 2.9  --  10.8* 8.7* 8.7* 6.0  HGB 13.0 12.6* 12.6* 12.2* 12.7* 12.4*  HCT 41.1 38.6* 39.0 37.9* 38.8* 38.0*  MCV 96.0 94.8 96.3 95.7 94.4 95.0  PLT 245 249 254 240 232 205     Basic Metabolic Panel: Recent Labs  Lab 10/15/20 2009 10/17/20 0545 10/17/20 2203 10/18/20 0526 10/19/20 0540 10/20/20 0728 10/21/20 0648  NA 140 138  --  139 139 139 137  K 4.1 4.8  --  4.1 4.9 4.5 4.5  CL 106 105  --  105 107 104 105  CO2 25 24  --  25 26 28 28   GLUCOSE 118* 185*  --  184* 181* 177* 216*  BUN 11 20  --  23 24* 23 24*  CREATININE 0.72 0.92  --  0.93 0.78 0.81 0.84  CALCIUM 9.2 9.2  --  9.2 8.8* 8.8* 8.4*  MG 2.2  --   --  2.3 2.3 2.2 2.2  PHOS  --   --  3.8 4.0  --  4.3 3.9  GFR: Estimated Creatinine Clearance: 70.7 mL/min (by C-G formula based on SCr of 0.84 mg/dL).  Liver Function Tests: Recent Labs  Lab 10/15/20 2009 10/18/20 0526 10/19/20 0540 10/20/20 0728 10/21/20 0648  AST 18 20 18 21  34  ALT 17 20 21 31  55*  ALKPHOS 79 75 66 66 60  BILITOT 0.4 0.2* 0.6 0.5 0.5  PROT 6.9 6.8 6.1* 6.2* 5.6*  ALBUMIN 4.0 3.7 3.3* 3.4* 3.1*    CBG: Recent Labs  Lab 10/20/20 0730 10/20/20 1214 10/20/20 1752 10/20/20 2204 10/21/20 0731  GLUCAP 166* 209* 198* 312* 192*     Recent Results (from the past 240 hour(s))  Resp Panel by RT-PCR (Flu Norely Schlick&B, Covid) Nasopharyngeal Swab     Status: None   Collection Time: 10/11/20  2:55 PM   Specimen: Nasopharyngeal Swab; Nasopharyngeal(NP) swabs in vial transport medium  Result Value Ref Range Status   SARS Coronavirus 2 by RT PCR NEGATIVE NEGATIVE Final    Comment: (NOTE) SARS-CoV-2 target nucleic acids are NOT DETECTED.  The SARS-CoV-2 RNA is generally detectable in upper respiratory specimens during the acute phase of infection. The lowest concentration of SARS-CoV-2 viral copies this assay can detect is 138 copies/mL. Sollie Vultaggio negative result does not preclude SARS-Cov-2 infection and should not be used as the sole basis for treatment or other patient management decisions. Ennis Heavner negative result may occur with  improper specimen collection/handling, submission of specimen other than nasopharyngeal swab,  presence of viral mutation(s) within the areas targeted by this assay, and inadequate number of viral copies(<138 copies/mL). Keelen Quevedo negative result must be combined with clinical observations, patient history, and epidemiological information. The expected result is Negative.  Fact Sheet for Patients:  EntrepreneurPulse.com.au  Fact Sheet for Healthcare Providers:  IncredibleEmployment.be  This test is no t yet approved or cleared by the [place] FDA and  has been authorized for detection and/or diagnosis of SARS-CoV-2 by FDA under an Emergency Use Authorization (EUA). This EUA will remain  in effect (meaning this test can be used) for the duration of the COVID-19 declaration under Section 564(b)(1) of the Act, 21 U.S.C.section 360bbb-3(b)(1), unless the authorization is terminated  or revoked sooner.       Influenza [place] by PCR NEGATIVE NEGATIVE Final   Influenza B by PCR NEGATIVE NEGATIVE Final    Comment: (NOTE) The Xpert Xpress SARS-CoV-2/FLU/RSV plus assay is intended as an aid in the diagnosis of influenza from Nasopharyngeal swab specimens and should not be used as Aundreya Souffrant sole basis for treatment. Nasal washings and aspirates are unacceptable for Xpert Xpress SARS-CoV-2/FLU/RSV testing.  Fact Sheet for Patients: EntrepreneurPulse.com.au  Fact Sheet for Healthcare Providers: IncredibleEmployment.be  This test is not yet approved or cleared by the [place] FDA and has been authorized for detection and/or diagnosis of SARS-CoV-2 by FDA under an Emergency Use Authorization (EUA). This EUA will remain in effect (meaning this test can be used) for the duration of the COVID-19 declaration under Section 564(b)(1) of the Act, 21 U.S.C. section 360bbb-3(b)(1), unless the authorization is terminated or revoked.  Performed at [facility], [place] [address], [place], [place] [address]   SARS CORONAVIRUS 2  (TAT 6-24 HRS) Nasopharyngeal Nasopharyngeal Swab     Status: None   Collection Time: 10/16/20  7:42 AM   Specimen: Nasopharyngeal Swab  Result Value Ref Range Status   SARS Coronavirus 2 NEGATIVE NEGATIVE Final    Comment: (NOTE) SARS-CoV-2 target nucleic acids are NOT DETECTED.  The SARS-CoV-2 RNA is generally detectable  in upper and lower respiratory specimens during the acute phase of infection. Negative results do not preclude SARS-CoV-2 infection, do not rule out co-infections with other pathogens, and should not be used as the sole basis for treatment or other patient management decisions. Negative results must be combined with clinical observations, patient history, and epidemiological information. The expected result is Negative.  Fact Sheet for Patients: SugarRoll.be  Fact Sheet for Healthcare Providers: https://www.woods-mathews.com/  This test is not yet approved or cleared by the [place] FDA and  has been authorized for detection and/or diagnosis of SARS-CoV-2 by FDA under an Emergency Use Authorization (EUA). This EUA will remain  in effect (meaning this test can be used) for the duration of the COVID-19 declaration under Se ction 564(b)(1) of the Act, 21 U.S.C. section 360bbb-3(b)(1), unless the authorization is terminated or revoked sooner.  Performed at [facility], [place] [address], [place], [place] [address]          Radiology Studies: No results found.      Scheduled Meds:  allopurinol  100 mg Oral Daily   amLODipine  10 mg Oral Daily   dexamethasone  4 mg Oral Q6H   docusate sodium  100 mg Oral BID   insulin aspart  0-15 Units Subcutaneous TID WC   insulin aspart  3 Units Subcutaneous TID WC   latanoprost  1 drop Both Eyes QHS   lisinopril  40 mg Oral Daily   rosuvastatin  20 mg Oral QHS   Continuous Infusions:  levETIRAcetam 1,000 mg (10/21/20 0551)     LOS: 5 days    Time spent:  over 30 min    Fayrene Helper, MD [facility]   To contact the attending provider between 7A-7P or the covering provider during after hours 7P-7A, please log into the web site [email or web address] and access using universal [place] password for that web site. If you do not have the password, please call the hospital operator.  10/21/2020, 12:08 PM

## 2020-10-22 ENCOUNTER — Ambulatory Visit
Admit: 2020-10-22 | Discharge: 2020-10-22 | Disposition: A | Discharge status: Home / Self Care | Payer: Medicare Other | Attending: Radiation Oncology | Admitting: Radiation Oncology

## 2020-10-22 DIAGNOSIS — C3490 Malignant neoplasm of unspecified part of unspecified bronchus or lung: Secondary | ICD-10-CM | POA: Diagnosis not present

## 2020-10-22 LAB — CBC WITH DIFFERENTIAL/PLATELET
Abs Immature Granulocytes: 0.05 10*3/uL (ref 0.00–0.07)
Basophils Absolute: 0 10*3/uL (ref 0.0–0.1)
Basophils Relative: 0 %
Eosinophils Absolute: 0 10*3/uL (ref 0.0–0.5)
Eosinophils Relative: 0 %
HCT: 38 % — ABNORMAL LOW (ref 39.0–52.0)
Hemoglobin: 12.6 g/dL — ABNORMAL LOW (ref 13.0–17.0)
Immature Granulocytes: 1 %
Lymphocytes Relative: 8 %
Lymphs Abs: 0.7 10*3/uL (ref 0.7–4.0)
MCH: 30.8 pg (ref 26.0–34.0)
MCHC: 33.2 g/dL (ref 30.0–36.0)
MCV: 92.9 fL (ref 80.0–100.0)
Monocytes Absolute: 0.5 10*3/uL (ref 0.1–1.0)
Monocytes Relative: 6 %
Neutro Abs: 7.1 10*3/uL (ref 1.7–7.7)
Neutrophils Relative %: 85 %
Platelets: 214 10*3/uL (ref 150–400)
RBC: 4.09 MIL/uL — ABNORMAL LOW (ref 4.22–5.81)
RDW: 11.6 % (ref 11.5–15.5)
WBC: 8.3 10*3/uL (ref 4.0–10.5)
nRBC: 0 % (ref 0.0–0.2)

## 2020-10-22 LAB — COMPREHENSIVE METABOLIC PANEL
ALT: 56 U/L — ABNORMAL HIGH (ref 0–44)
AST: 24 U/L (ref 15–41)
Albumin: 2.9 g/dL — ABNORMAL LOW (ref 3.5–5.0)
Alkaline Phosphatase: 60 U/L (ref 38–126)
Anion gap: 7 (ref 5–15)
BUN: 24 mg/dL — ABNORMAL HIGH (ref 8–23)
CO2: 28 mmol/L (ref 22–32)
Calcium: 8.6 mg/dL — ABNORMAL LOW (ref 8.9–10.3)
Chloride: 101 mmol/L (ref 98–111)
Creatinine, Ser: 0.85 mg/dL (ref 0.61–1.24)
GFR, Estimated: >60 mL/min (ref >60)
Glucose, Bld: 228 mg/dL — ABNORMAL HIGH (ref 70–99)
Potassium: 4.6 mmol/L (ref 3.5–5.1)
Sodium: 136 mmol/L (ref 135–145)
Total Bilirubin: 0.5 mg/dL (ref 0.3–1.2)
Total Protein: 5.5 g/dL — ABNORMAL LOW (ref 6.5–8.1)

## 2020-10-22 LAB — IRON AND TIBC
Iron: 64 ug/dL (ref 45–182)
Saturation Ratios: 24 % (ref 17.9–39.5)
TIBC: 272 ug/dL (ref 250–450)
UIBC: 208 ug/dL

## 2020-10-22 LAB — GLUCOSE, CAPILLARY
Glucose-Capillary: 209 mg/dL — ABNORMAL HIGH (ref 70–99)
Glucose-Capillary: 240 mg/dL — ABNORMAL HIGH (ref 70–99)
Glucose-Capillary: 191 mg/dL — ABNORMAL HIGH (ref 70–99)
Glucose-Capillary: 451 mg/dL — ABNORMAL HIGH (ref 70–99)
Glucose-Capillary: 461 mg/dL — ABNORMAL HIGH (ref 70–99)

## 2020-10-22 LAB — MAGNESIUM: Magnesium: 2.2 mg/dL (ref 1.7–2.4)

## 2020-10-22 MED ORDER — GABAPENTIN 300 MG PO CAPS
300.0000 mg | ORAL_CAPSULE | Freq: Once | ORAL | Status: AC
  Administered 2020-10-22: 300 mg via ORAL
  Filled 2020-10-22: qty 1

## 2020-10-22 MED ORDER — INSULIN ASPART 100 UNIT/ML IJ SOLN
15.0000 [IU] | Freq: Once | INTRAMUSCULAR | Status: AC
  Administered 2020-10-22: 15 [IU] via SUBCUTANEOUS

## 2020-10-22 MED ORDER — INSULIN GLARGINE 100 UNIT/ML ~~LOC~~ SOLN
7.0000 [IU] | Freq: Every day | SUBCUTANEOUS | Status: DC
  Administered 2020-10-22 – 2020-10-23 (×2): 7 [IU] via SUBCUTANEOUS
  Filled 2020-10-22 (×3): qty 0.07

## 2020-10-22 NOTE — Progress Notes (Signed)
Patient having difficulty with holding a washcloth to clean periarea, assistance provided.

## 2020-10-22 NOTE — Progress Notes (Signed)
PROGRESS NOTE    Benjamin Willis.  GHW:[id number] DOB: 06-25-1945 DOA: 10/15/2020 PCP: Horald Pollen, MD   Chief Complaint  Patient presents with   Weakness   Brief Narrative:  Benjamin Willis. is Benjamin Willis 75 y.o. male with medical history significant of DM; HTN; HLD; and metastatic lung cancer presenting with weakness. He had Benjamin Willis screening CT with Benjamin Willis RUL nodule.  He was scheduled for thoracoscopy with wedge resection in 09/2018 but the patient canceled the procedure and was lost to follow up.  On repeat imaging in 01/2019 there was interval progression on imaging.   He had  Benjamin Willis bronch on 9/30 with confirmation of Stage 1A2 adenocarcinoma and initiated radiation therapy.  PET scan in 02/2020 showed 3 hypermetabolic RUL nodules that were c/w disease recurrence.  He was supposed to return for CT simulation on 02/20/20 and appears to have again been lost to follow-up.  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  He attended school through about 5th or 6th grade.  He reports that he came to the ER because his sister made him get off the couch and come in.  He fell sometime ago and appears to have been mostly confined to the couch recently due to generalized weakness.  He does report that he has Benjamin Willis "long leg and Benjamin Willis short leg" and the short (left) leg apparently gave way and caused him to fall.     Dr. Lorin Willis spoke with his sister and he lives near her.  He does have some learning challenges, does not read or write well.  With the COVID, she was not allowed to attend appointments with her brother and she was unaware of the issues.  His sister is interested in palliative care and [name] services.  She says he "has Benjamin Willis zest for life" and thinks he would want resuscitation.    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  Unfortunately lost to follow up. - now with MRI brain showing four brain lesions consistent with metastases, extensive vasogenic edema associated with posterior right frontal lesion without midline shift - CT CAP with interval evolution of radiation friboris in the perihilar and suprhilar right lung.  Nodular component in the right upper central lobe is minimally decreased in size alothough more difficult to appreciate against background of developing fibrosis.  Findings c/w treatment response and expected post treatment appearance.  No evidence of LAD or metastatic disease in chest, abdomen, or pelvis.  - dexamethasone - oncology c/s, appreciate recs - radiation oncology c/s, appreciate assistance -Patient has apparent Intellectual Disability and does not appear to understand his illness; palliative care consultation has been requested.  Will discuss with sister. -PT/OT/TOC team consults also requested, as he appears likely to benefit from support at home vs. Placement   Left Arm Shaking  Concern for Partial Seizures - per RN, lasted about 10  min on 6/15  - in setting of above, concerning for partial seizure - EEG wnl - keppra - neurooncology, discussed over phone, plan for follow outpatient  Hiccups Plan for PPI Add gabapentin today Continue to monitor  DM -A1c 7.4 on recent check -hold Glucophage, Glucotrol, Januvia -Cover with moderate-scale SSI - mealtime insulin - lantus  - on steroids above   HTN -Continue Norvasc, Lisinopril   HLD -Continue Crestor   Glaucoma -Continue  Xalatan  DVT prophylaxis: SCD Code Status: full  Family Communication: none  at bedside Disposition:   Status is: Inpatient  Remains inpatient appropriate because:Inpatient level of care appropriate due to severity of illness  Dispo: The patient is from: Home              Anticipated d/c is to: Home              Patient currently is not medically stable to d/c.   Difficult to place patient No       Consultants:  Oncology Neuro oncology Radiation oncology  Procedures:  none  Antimicrobials:  Anti-infectives (From admission, onward)    None       Subjective: Continued c/o hiccups  Objective: Vitals:   10/21/20 1237 10/21/20 2124 10/22/20 0620 10/22/20 1340  BP: 130/77 126/72 (!) 143/90 119/64  Pulse: (!) 55 (!) 53 (!) 59 62  Resp: 17 16 16 16   Temp: 98.4 F (36.9 C) 98.7 F (37.1 C) 98.4 F (36.9 C) 98.4 F (36.9 C)  TempSrc: Oral Oral Oral Oral  SpO2: 98% 95% 98% 96%  Weight:      Height:        Intake/Output Summary (Last 24 hours) at 10/22/2020 1730 Last data filed at 10/22/2020 1238 Gross per 24 hour  Intake 580 ml  Output 800 ml  Net -220 ml   Filed Weights   10/16/20 1610  Weight: 72.4 kg    Examination:  General: No acute distress. Cardiovascular: Heart sounds show Meli Faley regular rate, and rhythm. Lungs: Clear to auscultation bilaterally Abdomen: Soft, nontender, nondistended Neurological: Alert and oriented 3. Moves all extremities 4 - L sided weakness. Cranial nerves II through XII grossly intact. Skin: Warm and dry. No rashes or lesions. Extremities: No clubbing or cyanosis. No edema.   Data Reviewed: I have personally reviewed following labs and imaging studies  CBC: Recent Labs  Lab 10/18/20 0526 10/19/20 0540 10/20/20 0728 10/21/20 0648 10/22/20 0602  WBC 11.9* 9.7 9.9 6.9 8.3  NEUTROABS 10.8* 8.7* 8.7* 6.0 7.1  HGB 12.6* 12.2* 12.7* 12.4* 12.6*  HCT 39.0 37.9* 38.8* 38.0* 38.0*  MCV 96.3 95.7 94.4 95.0 92.9  PLT 254  240 232 205 258    Basic Metabolic Panel: Recent Labs  Lab 10/17/20 2203 10/18/20 0526 10/19/20 0540 10/20/20 0728 10/21/20 0648 10/22/20 0602  NA  --  139 139 139 137 136  K  --  4.1 4.9 4.5 4.5 4.6  CL  --  105 107 104 105 101  CO2  --  25 26 28 28 28   GLUCOSE  --  184* 181* 177* 216* 228*  BUN  --  23 24* 23 24* 24*  CREATININE  --  0.93 0.78 0.81 0.84 0.85  CALCIUM  --  9.2 8.8* 8.8* 8.4* 8.6*  MG  --  2.3 2.3 2.2 2.2 2.2  PHOS 3.8 4.0  --  4.3 3.9  --     GFR: Estimated Creatinine Clearance: 69.8 mL/min (by C-G formula based on SCr  of 0.85 mg/dL).  Liver Function Tests: Recent Labs  Lab 10/18/20 0526 10/19/20 0540 10/20/20 0728 10/21/20 0648 10/22/20 0602  AST 20 18 21  34 24  ALT 20 21 31  55* 56*  ALKPHOS 75 66 66 60 60  BILITOT 0.2* 0.6 0.5 0.5 0.5  PROT 6.8 6.1* 6.2* 5.6* 5.5*  ALBUMIN 3.7 3.3* 3.4* 3.1* 2.9*    CBG: Recent Labs  Lab 10/21/20 1715 10/21/20 2223 10/22/20 0741 10/22/20 1117 10/22/20 1558  GLUCAP 181* 254* 209* 240* 191*     Recent Results (from the past 240 hour(s))  SARS CORONAVIRUS 2 (TAT 6-24 HRS) Nasopharyngeal Nasopharyngeal Swab     Status: None   Collection Time: 10/16/20  7:42 AM   Specimen: Nasopharyngeal Swab  Result Value Ref Range Status   SARS Coronavirus 2 NEGATIVE NEGATIVE Final    Comment: (NOTE) SARS-CoV-2 target nucleic acids are NOT DETECTED.  The SARS-CoV-2 RNA is generally detectable in upper and lower respiratory specimens during the acute phase of infection. Negative results do not preclude SARS-CoV-2 infection, do not rule out co-infections with Benjamin pathogens, and should not be used as the sole basis for treatment or Benjamin patient management decisions. Negative results must be combined with clinical observations, patient history, and epidemiological information. The expected result is Negative.  Fact Sheet for Patients: SugarRoll.be  Fact Sheet for Healthcare  Providers: https://www.woods-mathews.com/  This test is not yet approved or cleared by the [place] FDA and  has been authorized for detection and/or diagnosis of SARS-CoV-2 by FDA under an Emergency Use Authorization (EUA). This EUA will remain  in effect (meaning this test can be used) for the duration of the COVID-19 declaration under Se ction 564(b)(1) of the Act, 21 U.S.C. section 360bbb-3(b)(1), unless the authorization is terminated or revoked sooner.  Performed at [facility], [place] [address] [place], [place] [address]          Radiology Studies: No results found.      Scheduled Meds:  allopurinol  100 mg Oral Daily   amLODipine  10 mg Oral Daily   dexamethasone  4 mg Oral Q6H   docusate sodium  100 mg Oral BID   insulin aspart  0-15 Units Subcutaneous TID WC   insulin aspart  3 Units Subcutaneous TID WC   latanoprost  1 drop Both Eyes QHS   lisinopril  40 mg Oral Daily   pantoprazole  40 mg Oral Daily   rosuvastatin  20 mg Oral QHS   Continuous Infusions:  levETIRAcetam 1,000 mg (10/22/20 0527)     LOS: 6 days    Time spent: over 30 min    Fayrene Helper, MD [facility]   To contact the attending provider between 7A-7P or the covering provider during after hours 7P-7A, please log into the web site [email or web address] and access using universal [place] password for that web site. If you do not have the password, please call the hospital operator.  10/22/2020, 5:30 PM

## 2020-10-22 NOTE — TOC Progression Note (Signed)
Transition of Care (TOC) - Progression Note    Patient Details  Name: Benjamin Willis. MRN: [id number] Date of Birth: [birth year]  Transition of Care [facility]) CM/SW Contact  Nasiir Monts, Marjie Skiff, RN Phone Number: 10/22/2020, 3:51 PM  Clinical Narrative:     Spoke with pt sister via phone. Plan is for pt to dc home with her tomorrow. Sister is requesting RW for home. [employer] liaison contacted for RW. Home health and Outpatient palliative services have already been set up.  Expected Discharge Plan: [place] Barriers to Discharge: No Barriers Identified  Expected Discharge Plan and Services Expected Discharge Plan: [place]     Post Acute Care Choice: Home Health   HH Arranged: RN, PT, OT, Nurse's Aide, Social Work [employer] Agency: [place] ([facility]) Date [place]: 10/19/20 Time [place]: [place] Representative spoke with at [place]: Ramond Marrow   Social Determinants of Health ([place]) Interventions    Readmission Risk Interventions No flowsheet data found.

## 2020-10-22 NOTE — Progress Notes (Signed)
Inpatient Diabetes Program Recommendations  AACE/ADA: New Consensus Statement on Inpatient Glycemic Control (2015)  Target Ranges:  Prepandial:   less than 140 mg/dL      Peak postprandial:   less than 180 mg/dL (1-2 hours)      Critically ill patients:  140 - 180 mg/dL   Results for HURSHEL, BOUILLON (MRN [id number]) as of 10/22/2020 07:35  Ref. Range 10/21/2020 07:31 10/21/2020 12:12 10/21/2020 17:15 10/21/2020 22:23  Glucose-Capillary Latest Ref Range: 70 - 99 mg/dL 192 (H)  6 units NOVOLOG  304 (H)  14 units NOVOLOG  181 (H)  6 units NOVOLOG  254 (H)   Results for WESSLEY, EMERT (MRN [id number]) as of 10/22/2020 09:16  Ref. Range 10/22/2020 07:41  Glucose-Capillary Latest Ref Range: 70 - 99 mg/dL 209 (H)   Home DM Meds: Glipizide 5 mg bid      Januvia 100 mg Daily      Metformin 500 mg Daily   Current Orders: Novolog 0-15 units TID     Novolog 3 units TID with meals for Meal    Coverage   MD- Note patient getting Decadron 4 mg Q6H.  AM CBGs remain elevated  Please consider adding basal insulin while pt remains on Q6 hour Decadron   Lantus 7 units QHS (0.1 units/kg)    --Will follow patient during hospitalization--  Wyn Quaker RN, MSN, CDE Diabetes Coordinator Inpatient Glycemic Control Team Team Pager: [phone] (8a-5p)

## 2020-10-23 ENCOUNTER — Ambulatory Visit
Admit: 2020-10-23 | Discharge: 2020-10-23 | Disposition: A | Discharge status: Home / Self Care | Payer: Medicare Other | Attending: Radiation Oncology | Admitting: Radiation Oncology

## 2020-10-23 ENCOUNTER — Other Ambulatory Visit: Payer: Self-pay | Admitting: Oncology

## 2020-10-23 ENCOUNTER — Telehealth: Payer: Self-pay | Admitting: Internal Medicine

## 2020-10-23 DIAGNOSIS — C3490 Malignant neoplasm of unspecified part of unspecified bronchus or lung: Secondary | ICD-10-CM | POA: Diagnosis not present

## 2020-10-23 LAB — CBC WITH DIFFERENTIAL/PLATELET
Abs Immature Granulocytes: 0.05 10*3/uL (ref 0.00–0.07)
Basophils Absolute: 0 10*3/uL (ref 0.0–0.1)
Basophils Relative: 0 %
Eosinophils Absolute: 0 10*3/uL (ref 0.0–0.5)
Eosinophils Relative: 0 %
HCT: 39 % (ref 39.0–52.0)
Hemoglobin: 12.7 g/dL — ABNORMAL LOW (ref 13.0–17.0)
Immature Granulocytes: 1 %
Lymphocytes Relative: 7 %
Lymphs Abs: 0.5 10*3/uL — ABNORMAL LOW (ref 0.7–4.0)
MCH: 30.4 pg (ref 26.0–34.0)
MCHC: 32.6 g/dL (ref 30.0–36.0)
MCV: 93.3 fL (ref 80.0–100.0)
Monocytes Absolute: 0.5 10*3/uL (ref 0.1–1.0)
Monocytes Relative: 6 %
Neutro Abs: 7.2 10*3/uL (ref 1.7–7.7)
Neutrophils Relative %: 86 %
Platelets: 184 10*3/uL (ref 150–400)
RBC: 4.18 MIL/uL — ABNORMAL LOW (ref 4.22–5.81)
RDW: 11.6 % (ref 11.5–15.5)
WBC: 8.3 10*3/uL (ref 4.0–10.5)
nRBC: 0 % (ref 0.0–0.2)

## 2020-10-23 LAB — COMPREHENSIVE METABOLIC PANEL
ALT: 102 U/L — ABNORMAL HIGH (ref 0–44)
AST: 45 U/L — ABNORMAL HIGH (ref 15–41)
Albumin: 2.9 g/dL — ABNORMAL LOW (ref 3.5–5.0)
Alkaline Phosphatase: 57 U/L (ref 38–126)
Anion gap: 5 (ref 5–15)
BUN: 32 mg/dL — ABNORMAL HIGH (ref 8–23)
CO2: 27 mmol/L (ref 22–32)
Calcium: 8.7 mg/dL — ABNORMAL LOW (ref 8.9–10.3)
Chloride: 103 mmol/L (ref 98–111)
Creatinine, Ser: 0.92 mg/dL (ref 0.61–1.24)
GFR, Estimated: >60 mL/min (ref >60)
Glucose, Bld: 211 mg/dL — ABNORMAL HIGH (ref 70–99)
Potassium: 4.8 mmol/L (ref 3.5–5.1)
Sodium: 135 mmol/L (ref 135–145)
Total Bilirubin: 0.3 mg/dL (ref 0.3–1.2)
Total Protein: 5.5 g/dL — ABNORMAL LOW (ref 6.5–8.1)

## 2020-10-23 LAB — MAGNESIUM: Magnesium: 2.4 mg/dL (ref 1.7–2.4)

## 2020-10-23 LAB — GLUCOSE, CAPILLARY
Glucose-Capillary: 236 mg/dL — ABNORMAL HIGH (ref 70–99)
Glucose-Capillary: 254 mg/dL — ABNORMAL HIGH (ref 70–99)
Glucose-Capillary: 286 mg/dL — ABNORMAL HIGH (ref 70–99)
Glucose-Capillary: 353 mg/dL — ABNORMAL HIGH (ref 70–99)

## 2020-10-23 LAB — HEMOGLOBIN A1C
Hgb A1c MFr Bld: 7.5 % — ABNORMAL HIGH (ref 4.8–5.6)
Mean Plasma Glucose: 169 mg/dL

## 2020-10-23 LAB — PHOSPHORUS: Phosphorus: 3.6 mg/dL (ref 2.5–4.6)

## 2020-10-23 MED ORDER — GABAPENTIN 300 MG PO CAPS: 300.0000 mg | ORAL_CAPSULE | Freq: Every day | ORAL | 0 refills | Status: DC

## 2020-10-23 MED ORDER — PANTOPRAZOLE SODIUM 40 MG PO TBEC: 40.0000 mg | DELAYED_RELEASE_TABLET | Freq: Every day | ORAL | 0 refills | Status: DC

## 2020-10-23 MED ORDER — LEVETIRACETAM 1000 MG PO TABS: 1000.0000 mg | ORAL_TABLET | Freq: Two times a day (BID) | ORAL | 0 refills | Status: DC

## 2020-10-23 MED ORDER — BLOOD GLUCOSE MONITOR KIT: PACK | 0 refills | Status: DC

## 2020-10-23 MED ORDER — DEXAMETHASONE 4 MG PO TABS: 4.0000 mg | ORAL_TABLET | Freq: Four times a day (QID) | ORAL | 0 refills | Status: AC

## 2020-10-23 MED ORDER — METOCLOPRAMIDE HCL 5 MG/ML IJ SOLN
5.0000 mg | Freq: Once | INTRAMUSCULAR | Status: AC
  Administered 2020-10-23: 5 mg via INTRAVENOUS
  Filled 2020-10-23: qty 2

## 2020-10-23 NOTE — Progress Notes (Signed)
Physical Therapy Treatment Patient Details Name: Benjamin Willis. MRN: [id number] DOB: 04/18/1946 Today's Date: 10/23/2020    History of Present Illness Patient is a 75 year old male presenting with generalized weakness and a fall. 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 Patient starting brain radiation 6/15. PMH includes DM, HTN, HLD    PT Comments    The Patient  negotiated steps with 1 rail and HHA and cues for safety. Decreased  control of the  left leg when negotiating the steps, also noted L hand tends to not  let go of rail. Updated sister on patient's progress. Sister reports patient will have  close assistance 24/7.  Follow Up Recommendations  Home health PT;Supervision/Assistance - 24 hour     Equipment Recommendations  Rolling walker with 5" wheels    Recommendations for Other Services       Precautions / Restrictions Precautions Precautions: Fall Precaution Comments: left hemiparesis    Mobility  Bed Mobility Overal bed mobility: Needs Assistance Bed Mobility: Supine to Sit     Supine to sit: Min guard     General bed mobility comments: in recliner    Transfers Overall transfer level: Needs assistance Equipment used: Rolling walker (2 wheeled) Transfers: Sit to/from Stand Sit to Stand: Min assist         General transfer comment: VC for L hand placment, slow to move the Left hand to RW, decreased awareness of the  L hand on Rw after sitting down, hand stayed gripped. cues to remove  hand  Ambulation/Gait Ambulation/Gait assistance: Min assist Gait Distance (Feet): 25 Feet Assistive device: Rolling walker (2 wheeled) Gait Pattern/deviations: Step-to pattern;Decreased step length - left;Shuffle Gait velocity: decreased   General Gait Details: Decreased step  with LLE, assisting with RW to for turns. patient  at times picks up the RW to turn.   Stairs Stairs: Yes Stairs assistance: Min assist Stair  Management: One rail Right;Step to pattern;Alternating pattern Number of Stairs: 3 General stair comments: patient negotiated reciprocal going up and 1 step at a time going down, multimodal cues. Used 1 rail and 1 HHA,   Wheelchair Mobility    Modified Rankin (Stroke Patients Only)       Balance Overall balance assessment: History of Falls;Needs assistance Sitting-balance support: Feet supported;No upper extremity supported Sitting balance-Leahy Scale: Good Sitting balance - Comments: seated EOB   Standing balance support: Bilateral upper extremity supported;During functional activity Standing balance-Leahy Scale: Poor Standing balance comment: reliant on UE support                            Cognition Arousal/Alertness: Awake/alert Behavior During Therapy: WFL for tasks assessed/performed Overall Cognitive Status: History of cognitive impairments - at baseline                                        Exercises General Exercises - Lower Extremity Ankle Circles/Pumps: AROM;Both;10 reps Long Arc Quad: Seated;AROM;Strengthening;Both;10 reps Hip Flexion/Marching: Seated;AROM;Strengthening;Both;15 reps    General Comments        Pertinent Vitals/Pain Pain Assessment: No/denies pain    Home Living                      Prior Function            PT Goals (  current goals can now be found in the care plan section) Progress towards PT goals: Progressing toward goals    Frequency    Min 3X/week      PT Plan Current plan remains appropriate    Co-evaluation              AM-PAC PT "6 Clicks" Mobility   Outcome Measure  Help needed turning from your back to your side while in a flat bed without using bedrails?: A Little Help needed moving from lying on your back to sitting on the side of a flat bed without using bedrails?: A Little Help needed moving to and from a bed to a chair (including a wheelchair)?: A Little Help  needed standing up from a chair using your arms (e.g., wheelchair or bedside chair)?: A Little Help needed to walk in hospital room?: A Little Help needed climbing 3-5 steps with a railing? : A Lot 6 Click Score: 17    End of Session Equipment Utilized During Treatment: Gait belt Activity Tolerance: Patient tolerated treatment well Patient left: in chair;with call bell/phone within reach;with chair alarm set Nurse Communication: Mobility status PT Visit Diagnosis: Unsteadiness on feet (R26.81);Other abnormalities of gait and mobility (R26.89);Muscle weakness (generalized) (M62.81)     Time: 1694-5038 PT Time Calculation (min) (ACUTE ONLY): 18 min  Charges:  $Gait Training: 8-22 mins                    [place] Pager [phone] Office [phone]   Claretha Cooper 10/23/2020, 1:10 PM

## 2020-10-23 NOTE — Progress Notes (Signed)
Physical Therapy Treatment Patient Details Name: Benjamin Willis. MRN: [id number] DOB: Jul 21, 1945 Today's Date: 10/23/2020    History of Present Illness Patient is a 75 year old male presenting with generalized weakness and a fall. 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 Patient starting brain radiation 6/15. PMH includes DM, HTN, HLD    PT Comments    Patient  is resting in bed, has hiccups. Patient  slowly mobillized to sitting, using rail. Extra time. Patient  with noted decreased motor control  of  left hand and  leg. Called patient. Advised patient of provider's approval for requested procedure, as well as any comments/instructions from provider.     Patient has also been mailed a letter containing the following instructions :  Ambulated x 25' x 2 with min assistance for safety. Patient  with noted decreased step length on Left. Tends to drag LLE. Patient reports left U and LE are numb feeling.  Patient will be going home to sister's home with 4 steps. Will practice steps this AM.  Currently patient requires `1 person to assist him when ambulating. Patient reports that he has gotten up to BR with Rw without assistance when no  one came to assist him. Cautioned him to please wait for assist.   Follow Up Recommendations  Home health PT;Supervision/Assistance - 24 hour     Equipment Recommendations  Rolling walker with 5" wheels    Recommendations for Other Services       Precautions / Restrictions Precautions Precautions: Fall Precaution Comments: left hemiparesis    Mobility  Bed Mobility Overal bed mobility: Needs Assistance Bed Mobility: Supine to Sit     Supine to sit: Min guard     General bed mobility comments: extra time, use of bed rail, HOB elevated    Transfers Overall transfer level: Needs assistance Equipment used: Rolling walker (2 wheeled) Transfers: Sit to/from Stand Sit to Stand: Min assist          General transfer comment: VC for L hand placment, slow to move the Left hand to RW, decreased awareness of the  L hand on Rw after sitting down, hand stayed gripped. cues to remove  hand  Ambulation/Gait Ambulation/Gait assistance: Min assist Gait Distance (Feet): 25 Feet (x 2) Assistive device: Rolling walker (2 wheeled) Gait Pattern/deviations: Step-to pattern;Decreased step length - left;Shuffle Gait velocity: decreased   General Gait Details: Decreased step  with LLE, assisting with RW to for turns. patient  at times picks up the RW to turn.   Stairs             Wheelchair Mobility    Modified Rankin (Stroke Patients Only)       Balance   Sitting-balance support: Feet supported;No upper extremity supported Sitting balance-Leahy Scale: Good Sitting balance - Comments: seated EOB   Standing balance support: Bilateral upper extremity supported;During functional activity Standing balance-Leahy Scale: Poor Standing balance comment: reliant on UE support                            Cognition Arousal/Alertness: Awake/alert Behavior During Therapy: WFL for tasks assessed/performed Overall Cognitive Status: History of cognitive impairments - at baseline                                        Exercises General Exercises -  Lower Extremity Ankle Circles/Pumps: AROM;Both;10 reps Long Arc Quad: Seated;AROM;Strengthening;Both;10 reps Hip Flexion/Marching: Seated;AROM;Strengthening;Both;15 reps    General Comments        Pertinent Vitals/Pain Pain Assessment: No/denies pain    Home Living                      Prior Function            PT Goals (current goals can now be found in the care plan section) Progress towards PT goals: Progressing toward goals    Frequency    Min 3X/week      PT Plan Current plan remains appropriate    Co-evaluation              AM-PAC PT "6 Clicks" Mobility   Outcome Measure   Help needed turning from your back to your side while in a flat bed without using bedrails?: A Little Help needed moving from lying on your back to sitting on the side of a flat bed without using bedrails?: A Little Help needed moving to and from a bed to a chair (including a wheelchair)?: A Little Help needed standing up from a chair using your arms (e.g., wheelchair or bedside chair)?: A Little Help needed to walk in hospital room?: A Little Help needed climbing 3-5 steps with a railing? : A Lot 6 Click Score: 17    End of Session Equipment Utilized During Treatment: Gait belt Activity Tolerance: Patient tolerated treatment well Patient left: in chair;with call bell/phone within reach;with chair alarm set Nurse Communication: Mobility status PT Visit Diagnosis: Unsteadiness on feet (R26.81);Other abnormalities of gait and mobility (R26.89);Muscle weakness (generalized) (M62.81)     Time: 1025-8527 PT Time Calculation (min) (ACUTE ONLY): 28 min  Charges:  $Gait Training: 23-37 mins                     [place] Pager [phone] Office [phone]    Claretha Cooper 10/23/2020, 10:24 AM

## 2020-10-23 NOTE — Discharge Summary (Signed)
Physician Discharge Summary  [name]. QMV:[id number] DOB: 1945-11-05 DOA: 10/15/2020  PCP: Horald Pollen, MD  Admit date: 10/15/2020 Discharge date: 10/23/2020  Time spent: 40 minutes  Recommendations for Outpatient Follow-up:  Follow outpatient CBC/CMP Follow with radiation oncology and oncology outpatient Follow with neuro oncology outpatient Follow steroid taper per radiation oncology Follow blood sugars, if hyperglycemia, may need to increase glipizide vs starting insulin - expect it should improve with tapering steroids eventually  Follow LFT's outpatient - holding crestor   Discharge Diagnoses:  Principal Problem:   Metastatic lung cancer (metastasis from lung to other site) [facility]) Active Problems:   Goals of care, counseling/discussion   Dyslipidemia associated with type 2 diabetes mellitus ([place])   Hypertension associated with type 2 diabetes mellitus ([place])   Diabetes mellitus type 2 in nonobese ([place])   Glaucoma   Metastatic cancer to brain [facility])   Palliative care by specialist   Discharge Condition: stable  Diet recommendation: heart healthy  Filed Weights   10/16/20 1610  Weight: 72.4 kg    History of present illness:  Benjamin Willis. is Benjamin Willis 75 y.o. male with medical history significant of DM; HTN; HLD; and metastatic lung cancer presenting with weakness.  He was found to have metastatic disease to the brain.  He's been seen by radiation oncology and started on whole brain radiation and dexamethasone.  Plan for discharge to day with outpatient follow up with oncology, neuro oncology, and radiation oncology.  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  Unfortunately lost to follow up. - now with MRI brain showing four brain lesions consistent with  metastases, extensive vasogenic edema associated with posterior right frontal lesion without midline shift - CT CAP with interval evolution of radiation fibrosis in the perihilar and suprhilar right lung.  Nodular component in the right upper central lobe is minimally decreased in size alothough more difficult to appreciate against background of developing fibrosis.  Findings c/w treatment response and expected post treatment appearance.  No evidence of LAD or metastatic disease in chest, abdomen, or pelvis. 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 Pt going home with sister, please discuss plan of care with sister  Left Arm Shaking  Concern for Partial Seizures - per RN, lasted about 10 min on 6/15 - in setting of above, concerning for partial seizure - EEG wnl - keppra 1000 mg BID at [place] - neurooncology, discussed over phone, plan for follow outpatient - appreciate assistance   Hiccups PPI Gabapentin Dose of reglan x1 prior to discharge Follow outpatient - adjust prn Continue to monitor   DM -A1c 7.4 on recent check -hold Glucophage, Glucotrol, Januvia -> resume at discharge - hyperglycemia related to steroids, this should improve when steroids are tapered - In the interim, continue glipizide, metoformin, januvia as prescribed.  Can bump glipizide dose if needed.  If persistent hyperglycemia, consider transitioning to insulin.  - on steroids above   HTN -Continue Norvasc, Lisinopril   HLD -mildly elevated LFT's,  hold crestor, follow with PCP   Glaucoma -Continue Xalatan  Procedures: EEG IMPRESSION: This study is within normal limits. No  seizures or epileptiform discharges were seen throughout the recording.  Consultations: Radiation oncology Oncology Neuro oncology over phone  Discharge Exam: Vitals:   10/22/20 2123 10/23/20 0534  BP: (!) 122/58 (!) 117/58  Pulse: 61 (!) 48  Resp: 17 17  Temp: 98.5 F (36.9 C) 98.1 F (36.7 C)  SpO2: 94% 100%   No new complaints Discussed d/c plan with sister  General: No acute distress. Cardiovascular: RRR Lungs: unlabored Abdomen: Soft, nontender, nondistended Neurological: Alert and oriented 3. L sided weakness. Cranial nerves II through XII grossly intact. Skin: Warm and dry. No rashes or lesions. Extremities: No clubbing or cyanosis. No edema.   Discharge Instructions   Discharge Instructions     Call MD for:  difficulty breathing, headache or visual disturbances   Complete by: As directed    Call MD for:  extreme fatigue   Complete by: As directed    Call MD for:  hives   Complete by: As directed    Call MD for:  persistant dizziness or light-headedness   Complete by: As directed    Call MD for:  persistant nausea and vomiting   Complete by: As directed    Call MD for:  redness, tenderness, or signs of infection (pain, swelling, redness, odor or green/yellow discharge around incision site)   Complete by: As directed    Call MD for:  severe uncontrolled pain   Complete by: As directed    Call MD for:  temperature >100.4   Complete by: As directed    Diet - low sodium heart healthy   Complete by: As directed    Discharge instructions   Complete by: As directed    You were seen for metastatic cancer to the brain.    Radiation oncology has started whole brain radiation.  We've also started you on steroids.  Continue the dexamethasone 4 times daily.  Please follow up with radiation oncology for instructions on how to taper this medicine.    We started you on keppra (seizure medicine) due to concern for Jerone Cudmore partial seizure.  Dr. Barbaraann Cao (the neuro oncologist)  will arrange [place] follow up appointment for you.  Follow up with your general oncologist.  I expect your blood sugars to be higher than normal on steroids.  Resume your glipizide 5 mg twice daily and metformin.  If your blood sugars are high at home, you can touch base with your PCP about increasing the glipizide to 10 mg twice daily.  If you continue to have high blood sugars on this, you may need to transition to insulin, but hopefully you'll be able to taper your steroids with radiation oncology and your sugars will improve.    Continue protonix and gabapentin.  These may help with the hiccups.   Stop your crestor.  Your liver enzymes are mildly elevated.  Follow with your PCP outpatient.  Return for new, recurrent, or worsening symptoms.  Please ask your PCP to request records from this hospitalization so they know what was done and what the next steps will be.   Increase activity slowly   Complete by: As directed       Allergies as of 10/23/2020   No Known Allergies      Medication List     STOP taking these medications    furosemide 20 MG tablet Commonly known as: LASIX  rosuvastatin 20 MG tablet Commonly known as: CRESTOR       TAKE these medications    albuterol 108 (90 Base) MCG/ACT inhaler Commonly known as: VENTOLIN HFA Inhale 2 puffs into the lungs every 6 (six) hours as needed for wheezing or shortness of breath.   allopurinol 100 MG tablet Commonly known as: ZYLOPRIM Take 100 mg by mouth daily.   amLODipine 10 MG tablet Commonly known as: NORVASC Take 1 tablet (10 mg total) by mouth daily.   blood glucose meter kit and supplies Kit Dispense based on patient and insurance preference. Use up to four times daily as directed. What changed: additional instructions   Blood Pressure Monitor/Arm Devi Check BP once Miryah Ralls day. DX I 10   cetirizine 10 MG tablet Commonly known as: ZYRTEC Take 1 tablet (10 mg total) by mouth daily. What changed:  when to take  this reasons to take this   dexamethasone 4 MG tablet Commonly known as: DECADRON Take 1 tablet (4 mg total) by mouth every 6 (six) hours for 14 days. Follow up with radiation oncology for taper.   gabapentin 300 MG capsule Commonly known as: Neurontin Take 1 capsule (300 mg total) by mouth at bedtime.   glipiZIDE 5 MG tablet Commonly known as: GLUCOTROL TAKE 1 TABLET BY MOUTH TWICE DAILY WITH Ariele Vidrio MEAL What changed: when to take this   latanoprost 0.005 % ophthalmic solution Commonly known as: XALATAN Place 1 drop into both eyes at bedtime.   levETIRAcetam 1000 MG tablet Commonly known as: Keppra Take 1 tablet (1,000 mg total) by mouth 2 (two) times daily.   lisinopril 40 MG tablet Commonly known as: ZESTRIL Take 1 tablet (40 mg total) by mouth daily.   metFORMIN 500 MG tablet Commonly known as: GLUCOPHAGE Take 1 tablet (500 mg total) by mouth 2 (two) times daily with Kurtiss Wence meal. Take 1 tablet by mouth once daily with breakfast   Multivitamin Men Tabs Take 1 tablet by mouth daily.   pantoprazole 40 MG tablet Commonly known as: PROTONIX Take 1 tablet (40 mg total) by mouth daily. Start taking on: October 24, 2020   sitaGLIPtin 100 MG tablet Commonly known as: Januvia Take 1 tablet (100 mg total) by mouth daily.               Durable Medical Equipment  (From admission, onward)           Start     Ordered   10/23/20 1415  For home use only DME Walker rolling  Once       Question Answer Comment  Walker: With [place] Wheels   Patient needs Taysom Glymph walker to treat with the following condition Physical deconditioning      10/23/20 1414   10/22/20 1549  For home use only DME Walker rolling  Once       Question Answer Comment  Walker: With 5 Inch Wheels   Patient needs Ife Vitelli walker to treat with the following condition Weakness      10/22/20 1548           No Known Allergies  Follow-up Information     [facility] Follow up.   Why: They will contact you  to set up outpatient palliative services. Contact information: [place], [facility].   Specialty: Home Health Services Why: PT, OT, RN, SW, aide  The results of significant diagnostics from this hospitalization (including imaging, microbiology, ancillary and laboratory) are listed below for reference.    Significant Diagnostic Studies: DG Chest 2 View  Result Date: 10/15/2020 CLINICAL DATA:  75 year old male with weakness. EXAM: CHEST - 2 VIEW COMPARISON:  Chest radiograph dated 10/11/2020. FINDINGS: Shallow inspiration. Focal area of rounded opacity with several associated fiducial markers as seen on the prior radiograph. No new consolidation. There is no pleural effusion or pneumothorax. Mild cardiomegaly. No acute osseous pathology. IMPRESSION: 1. No acute cardiopulmonary process. No interval change in the appearance of the rounded right upper lobe density. 2. Mild cardiomegaly. Electronically Signed   By: Anner Crete M.D.   On: 10/15/2020 20:41   DG Chest 2 View  Result Date: 10/11/2020 CLINICAL DATA:  Cough and dyspnea today. Non-small cell right lung cancer. EXAM: CHEST - 2 VIEW COMPARISON:  02/02/2019 chest radiograph.  01/25/2020 chest CT. FINDINGS: Stable cardiomediastinal silhouette with normal heart size. No pneumothorax. No pleural effusion. Three clustered fiducial markers in the upper right lung with surrounding patchy ill-defined opacity, which appears mildly increased from prior chest CT. Clear left lung. Nipple shadows overlie the lower lungs. IMPRESSION: Patchy ill-defined upper right lung opacity surrounding clustered fiducial markers, mildly increased from prior chest CT, nonspecific, differential including post treatment change, recurrent tumor or pneumonia. Chest CT suggested for further evaluation. Electronically Signed   By: Ilona Sorrel M.D.   On: 10/11/2020 14:14    CT Head Wo Contrast  Result Date: 10/15/2020 CLINICAL DATA:  Weakness EXAM: CT HEAD WITHOUT CONTRAST TECHNIQUE: Contiguous axial images were obtained from the base of the skull through the vertex without intravenous contrast. COMPARISON:  None. FINDINGS: Brain: Cystic mass is seen in the right parietal lobe measuring 3.3 cm with surrounding extensive vasogenic edema. Probable cystic mass in the left occipital lobe measuring 2 cm with surrounding edema. Possible additional lesion in the right occipital lobe and right cerebellar hemisphere. Findings suspicious for metastatic disease. No hemorrhage. No midline shift or hydrocephalus. Vascular: No hyperdense vessel or unexpected calcification. Skull: No acute calvarial abnormality. Sinuses/Orbits: No acute findings Other: None IMPRESSION: Multiple apparent cystic lesions within the brain with surrounding vasogenic edema concerning for metastatic disease. Recommend further evaluation with MRI. These results were called by telephone at the time of interpretation on 10/15/2020 at 9:03 pm to provider [facility] , who verbally acknowledged these results. Electronically Signed   By: Rolm Baptise M.D.   On: 10/15/2020 21:05   MR Brain W and Wo Contrast  Result Date: 10/16/2020 CLINICAL DATA:  Left greater than right-sided weakness. Multiple brain lesions on CT. History of lung cancer. EXAM: MRI HEAD WITHOUT AND WITH CONTRAST TECHNIQUE: Multiplanar, multiecho pulse sequences of the brain and surrounding structures were obtained without and with intravenous contrast. CONTRAST:  7.54mL GADAVIST GADOBUTROL 1 MMOL/ML IV SOLN COMPARISON:  Head CT 10/15/2020 FINDINGS: Brain: Partially cystic lesions with irregular peripheral enhancement measured 2.2 x 1.7 cm in the posteromedial right cerebellar hemisphere (series 10, image 16), 2.6 x 2.1 cm in the left occipital lobe (series 10, image 27), 2.4 x 2.0 cm in the medial right parietal lobe (series 10, image 32), and 3.3  x 3.1 cm in the posterior right frontal lobe (series 10, image 40). There is Myquan Schaumburg small amount of chronic blood products associated with the lesions. There is extensive vasogenic edema associated with the posterior right frontal lesion and milder edema associated with the other cerebral and cerebellar lesions. There is no  midline shift. Scattered small foci of T2 hyperintensity in the cerebral white matter and pons are nonspecific but compatible with mild chronic small vessel ischemic disease. No acute infarct or extra-axial fluid collection is evident. Cerebral volume is within normal limits for age. Vascular: Major intracranial vascular flow voids are preserved. Skull and upper cervical spine: Unremarkable bone marrow signal. Sinuses/Orbits: Mild depression of the right lamina papyracea which may reflect Maclovio Henson remote orbital fracture. Paranasal sinuses and mastoid air cells are clear. Other: None. IMPRESSION: 1. Four brain lesions consistent with metastases. Extensive vasogenic edema associated with the posterior right frontal lesion without midline shift. 2. Mild chronic small vessel ischemic disease. Electronically Signed   By: Logan Bores M.D.   On: 10/16/2020 09:25   CT CHEST ABDOMEN PELVIS W CONTRAST  Result Date: 10/16/2020 CLINICAL DATA:  Brain metastases, staging, history of lung cancer EXAM: CT CHEST, ABDOMEN, AND PELVIS WITH CONTRAST TECHNIQUE: Multidetector CT imaging of the chest, abdomen and pelvis was performed following the standard protocol during bolus administration of intravenous contrast. CONTRAST:  146mL OMNIPAQUE IOHEXOL 300 MG/ML  SOLN COMPARISON:  02/13/2020 FINDINGS: CT CHEST FINDINGS Cardiovascular: Aortic atherosclerosis. Normal heart size. No pericardial effusion. Mediastinum/Nodes: No enlarged mediastinal, hilar, or axillary lymph nodes. Thyroid gland, trachea, and esophagus demonstrate no significant findings. Lungs/Pleura: Expected interval evolution of radiation fibrosis in the  perihilar and suprahilar right lung. Petula Rotolo nodular component in the central right upper lobe is minimally decreased in size although more difficult to appreciate against Maclane Holloran background of developing fibrosis, measuring approximately 1.4 x 1.3 cm, previously 1.5 x 1.5 cm (series 5, image 51). No pleural effusion or pneumothorax. Musculoskeletal: No chest wall mass or suspicious bone lesions identified. CT ABDOMEN PELVIS FINDINGS Hepatobiliary: No solid liver abnormality is seen. No gallstones, gallbladder wall thickening, or biliary dilatation. Pancreas: Unremarkable. No pancreatic ductal dilatation or surrounding inflammatory changes. Spleen: Normal in size without significant abnormality. Adrenals/Urinary Tract: Stable small right adrenal adenoma, measuring 1.3 x 0.9 cm (series 3, image 61). Kidneys are normal, without renal calculi, solid lesion, or hydronephrosis. Bladder is unremarkable. Stomach/Bowel: Stomach is within normal limits. Appendix appears normal. No evidence of bowel wall thickening, distention, or inflammatory changes. Vascular/Lymphatic: Aortic atherosclerosis. No enlarged abdominal or pelvic lymph nodes. Reproductive: No mass or other abnormality. Other: Small, fat containing bilateral inguinal hernias. No abdominopelvic ascites. Musculoskeletal: No acute or significant osseous findings. IMPRESSION: 1. Expected interval evolution of radiation fibrosis in the perihilar and suprahilar right lung. Allean Montfort nodular component in the central right upper lobe is minimally decreased in size although more difficult to appreciate against Zenola Dezarn background of developing fibrosis. Other previously described nodules are not well appreciated. Findings are consistent with treatment response and expected post treatment appearance. 2. No evidence of lymphadenopathy or metastatic disease in the chest, abdomen or pelvis. Aortic Atherosclerosis (ICD10-I70.0). Electronically Signed   By: Eddie Candle M.D.   On: 10/16/2020 10:21     Microbiology: Recent Results (from the past 240 hour(s))  SARS CORONAVIRUS 2 (TAT 6-24 HRS) Nasopharyngeal Nasopharyngeal Swab     Status: None   Collection Time: 10/16/20  7:42 AM   Specimen: Nasopharyngeal Swab  Result Value Ref Range Status   SARS Coronavirus 2 NEGATIVE NEGATIVE Final    Comment: (NOTE) SARS-CoV-2 target nucleic acids are NOT DETECTED.  The SARS-CoV-2 RNA is generally detectable in upper and lower respiratory specimens during the acute phase of infection. Negative results do not preclude SARS-CoV-2 infection, do not rule out co-infections with other pathogens,  and should not be used as the sole basis for treatment or other patient management decisions. Negative results must be combined with clinical observations, patient history, and epidemiological information. The expected result is Negative.  Fact Sheet for Patients: SugarRoll.be  Fact Sheet for Healthcare Providers: https://www.woods-mathews.com/  This test is not yet approved or cleared by the [place] FDA and  has been authorized for detection and/or diagnosis of SARS-CoV-2 by FDA under an Emergency Use Authorization (EUA). This EUA will remain  in effect (meaning this test can be used) for the duration of the COVID-19 declaration under Se ction 564(b)(1) of the Act, 21 U.S.C. section 360bbb-3(b)(1), unless the authorization is terminated or revoked sooner.  Performed at [facility], [place] [address] [place], [place] [address]      Labs: Basic Metabolic Panel: Recent Labs  Lab 10/17/20 0545 10/17/20 2203 10/18/20 0526 10/19/20 0540 10/20/20 0728 10/21/20 0648 10/22/20 0602 10/23/20 0613  NA  --   --  139 139 139 137 136 135  K  --   --  4.1 4.9 4.5 4.5 4.6 4.8  CL  --   --  105 107 104 105 101 103  CO2  --   --  $R'25 26 28 28 28 27  'of$ GLUCOSE  --   --  184* 181* 177* 216* 228* 211*  BUN  --   --  23 24* 23 24* 24* 32*  CREATININE  --    --  0.93 0.78 0.81 0.84 0.85 0.92  CALCIUM  --   --  9.2 8.8* 8.8* 8.4* 8.6* 8.7*  MG   < >  --  2.3 2.3 2.2 2.2 2.2 2.4  PHOS  --  3.8 4.0  --  4.3 3.9  --  3.6   < > = values in this interval not displayed.   Liver Function Tests: Recent Labs  Lab 10/19/20 0540 10/20/20 0728 10/21/20 0648 10/22/20 0602 10/23/20 0613  AST 18 21 34 24 45*  ALT 21 31 55* 56* 102*  ALKPHOS 66 66 60 60 57  BILITOT 0.6 0.5 0.5 0.5 0.3  PROT 6.1* 6.2* 5.6* 5.5* 5.5*  ALBUMIN 3.3* 3.4* 3.1* 2.9* 2.9*   No results for input(s): LIPASE, AMYLASE in the last 168 hours. No results for input(s): AMMONIA in the last 168 hours. CBC: Recent Labs  Lab 10/19/20 0540 10/20/20 0728 10/21/20 0648 10/22/20 0602 10/23/20 0613  WBC 9.7 9.9 6.9 8.3 8.3  NEUTROABS 8.7* 8.7* 6.0 7.1 7.2  HGB 12.2* 12.7* 12.4* 12.6* 12.7*  HCT 37.9* 38.8* 38.0* 38.0* 39.0  MCV 95.7 94.4 95.0 92.9 93.3  PLT 240 232 205 214 184   Cardiac Enzymes: No results for input(s): CKTOTAL, CKMB, CKMBINDEX, TROPONINI in the last 168 hours. BNP: BNP (last 3 results) No results for input(s): BNP in the last 8760 hours.  ProBNP (last 3 results) No results for input(s): PROBNP in the last 8760 hours.  CBG: Recent Labs  Lab 10/22/20 2221 10/23/20 0029 10/23/20 0447 10/23/20 0721 10/23/20 1147  GLUCAP 451* 353* 254* 236* 286*       Signed:  Fayrene Helper MD.  [facility] 10/23/2020, 2:22 PM

## 2020-10-23 NOTE — Progress Notes (Signed)
Inpatient Diabetes Program Recommendations  AACE/ADA: New Consensus Statement on Inpatient Glycemic Control (2015)  Target Ranges:  Prepandial:   less than 140 mg/dL      Peak postprandial:   less than 180 mg/dL (1-2 hours)      Critically ill patients:  140 - 180 mg/dL   Lab Results  Component Value Date   GLUCAP 236 (H) 10/23/2020   HGBA1C 7.5 (H) 10/22/2020    Review of Glycemic Control Results for SHAMAL, STRACENER (MRN [id number]) as of 10/23/2020 11:14  Ref. Range 10/23/2020 00:29 10/23/2020 04:47 10/23/2020 07:21  Glucose-Capillary Latest Ref Range: 70 - 99 mg/dL 353 (H) 254 (H) 236 (H)  Home DM Meds: Glipizide 5 mg bid      Januvia 100 mg Daily      Metformin 500 mg Daily   Current Orders: Novolog 0-15 units TID    Novolog 3 units TID with meals for Meal coverage    Lantus 7 units daily  MD- Note patient getting Decadron 4 mg Q6H. Inpatient Diabetes Program Recommendations:   Consider increasing Lantus to 12 units daily and increase Novolog meal coverage to 5 units tid with meals while on steroids.   Thanks,  Adah Perl, RN, BC-ADM Inpatient Diabetes Coordinator Pager [phone] (8a-5p)

## 2020-10-23 NOTE — Telephone Encounter (Signed)
Received a sch msg to schedule a new pt appt for lung cancer w/brain mets. Benjamin Willis has been scheduled to see Dr. Mickeal Skinner on 7/5 at 10am. I lft the appt date and time on his vm. Letter mailed.

## 2020-10-24 ENCOUNTER — Other Ambulatory Visit: Payer: Self-pay | Admitting: *Deleted

## 2020-10-24 ENCOUNTER — Telehealth: Payer: Self-pay | Admitting: Emergency Medicine

## 2020-10-24 ENCOUNTER — Encounter: Payer: Self-pay | Admitting: *Deleted

## 2020-10-24 ENCOUNTER — Ambulatory Visit
Admission: RE | Admit: 2020-10-24 | Discharge: 2020-10-24 | Disposition: A | Discharge status: Home / Self Care | Payer: Medicare Other | Source: Ambulatory Visit | Attending: Radiation Oncology | Admitting: Radiation Oncology

## 2020-10-24 DIAGNOSIS — Z510 Encounter for antineoplastic radiation therapy: Secondary | ICD-10-CM | POA: Diagnosis not present

## 2020-10-24 DIAGNOSIS — C3411 Malignant neoplasm of upper lobe, right bronchus or lung: Secondary | ICD-10-CM

## 2020-10-24 DIAGNOSIS — C7931 Secondary malignant neoplasm of brain: Secondary | ICD-10-CM | POA: Diagnosis not present

## 2020-10-24 NOTE — Progress Notes (Signed)
I received referral on Mr. Andersson. I updated new patient coordinator to call and schedule him to be seen with Dr. Julien Nordmann next week.

## 2020-10-24 NOTE — Telephone Encounter (Signed)
    Patient discharged from hospital, [facility] calling to request palliative care  services   Phone [phone] option 2

## 2020-10-25 ENCOUNTER — Ambulatory Visit
Admission: RE | Admit: 2020-10-25 | Discharge: 2020-10-25 | Disposition: A | Discharge status: Home / Self Care | Payer: Medicare Other | Source: Ambulatory Visit | Attending: Radiation Oncology | Admitting: Radiation Oncology

## 2020-10-25 ENCOUNTER — Telehealth: Payer: Self-pay | Admitting: Emergency Medicine

## 2020-10-25 ENCOUNTER — Other Ambulatory Visit: Payer: Self-pay

## 2020-10-25 DIAGNOSIS — E785 Hyperlipidemia, unspecified: Secondary | ICD-10-CM | POA: Diagnosis not present

## 2020-10-25 DIAGNOSIS — C7931 Secondary malignant neoplasm of brain: Secondary | ICD-10-CM | POA: Diagnosis not present

## 2020-10-25 DIAGNOSIS — C3411 Malignant neoplasm of upper lobe, right bronchus or lung: Secondary | ICD-10-CM | POA: Diagnosis not present

## 2020-10-25 DIAGNOSIS — E1169 Type 2 diabetes mellitus with other specified complication: Secondary | ICD-10-CM | POA: Diagnosis not present

## 2020-10-25 DIAGNOSIS — Z510 Encounter for antineoplastic radiation therapy: Secondary | ICD-10-CM | POA: Diagnosis not present

## 2020-10-25 DIAGNOSIS — Z9181 History of falling: Secondary | ICD-10-CM | POA: Diagnosis not present

## 2020-10-25 DIAGNOSIS — Z7984 Long term (current) use of oral hypoglycemic drugs: Secondary | ICD-10-CM | POA: Diagnosis not present

## 2020-10-25 DIAGNOSIS — Z87891 Personal history of nicotine dependence: Secondary | ICD-10-CM | POA: Diagnosis not present

## 2020-10-25 DIAGNOSIS — C3481 Malignant neoplasm of overlapping sites of right bronchus and lung: Secondary | ICD-10-CM | POA: Diagnosis not present

## 2020-10-25 DIAGNOSIS — I10 Essential (primary) hypertension: Secondary | ICD-10-CM | POA: Diagnosis not present

## 2020-10-25 DIAGNOSIS — H409 Unspecified glaucoma: Secondary | ICD-10-CM | POA: Diagnosis not present

## 2020-10-25 NOTE — Telephone Encounter (Signed)
   [place] Name: [facility] Agency Name: [facility]  Callback Phone #: [phone] LVM  Service Requested: skilled nursing  (examples: OT/PT/Skilled Nursing/Social Work/Speech Therapy/Wound Care) Frequency of Visits: 1w9

## 2020-10-26 ENCOUNTER — Ambulatory Visit
Admission: RE | Admit: 2020-10-26 | Discharge: 2020-10-26 | Disposition: A | Discharge status: Home / Self Care | Payer: Medicare Other | Source: Ambulatory Visit | Attending: Radiation Oncology | Admitting: Radiation Oncology

## 2020-10-26 DIAGNOSIS — C7931 Secondary malignant neoplasm of brain: Secondary | ICD-10-CM | POA: Diagnosis not present

## 2020-10-26 DIAGNOSIS — Z510 Encounter for antineoplastic radiation therapy: Secondary | ICD-10-CM | POA: Diagnosis not present

## 2020-10-26 DIAGNOSIS — C3411 Malignant neoplasm of upper lobe, right bronchus or lung: Secondary | ICD-10-CM | POA: Diagnosis not present

## 2020-10-26 NOTE — Telephone Encounter (Signed)
Faith Rogue at [facility] called to request verbal order for skilled nursing with frequency of visit: 1w9. Order approved, please advise. Thanks

## 2020-10-27 ENCOUNTER — Other Ambulatory Visit: Payer: Self-pay | Admitting: Emergency Medicine

## 2020-10-27 DIAGNOSIS — I152 Hypertension secondary to endocrine disorders: Secondary | ICD-10-CM

## 2020-10-27 DIAGNOSIS — C3490 Malignant neoplasm of unspecified part of unspecified bronchus or lung: Secondary | ICD-10-CM

## 2020-10-27 DIAGNOSIS — E119 Type 2 diabetes mellitus without complications: Secondary | ICD-10-CM

## 2020-10-27 DIAGNOSIS — C7931 Secondary malignant neoplasm of brain: Secondary | ICD-10-CM

## 2020-10-27 DIAGNOSIS — C3411 Malignant neoplasm of upper lobe, right bronchus or lung: Secondary | ICD-10-CM

## 2020-10-27 NOTE — Telephone Encounter (Signed)
Approved on my end.  Thanks.

## 2020-10-27 NOTE — Telephone Encounter (Signed)
Palliative care referral placed today.

## 2020-10-29 ENCOUNTER — Ambulatory Visit
Admission: RE | Admit: 2020-10-29 | Discharge: 2020-10-29 | Disposition: A | Discharge status: Home / Self Care | Payer: Medicare Other | Source: Ambulatory Visit | Attending: Radiation Oncology | Admitting: Radiation Oncology

## 2020-10-29 ENCOUNTER — Telehealth: Payer: Self-pay | Admitting: Student

## 2020-10-29 ENCOUNTER — Telehealth: Payer: Self-pay | Admitting: Emergency Medicine

## 2020-10-29 ENCOUNTER — Other Ambulatory Visit: Payer: Self-pay

## 2020-10-29 DIAGNOSIS — E785 Hyperlipidemia, unspecified: Secondary | ICD-10-CM | POA: Diagnosis not present

## 2020-10-29 DIAGNOSIS — C7931 Secondary malignant neoplasm of brain: Secondary | ICD-10-CM | POA: Diagnosis not present

## 2020-10-29 DIAGNOSIS — E1169 Type 2 diabetes mellitus with other specified complication: Secondary | ICD-10-CM | POA: Diagnosis not present

## 2020-10-29 DIAGNOSIS — C3411 Malignant neoplasm of upper lobe, right bronchus or lung: Secondary | ICD-10-CM | POA: Diagnosis not present

## 2020-10-29 DIAGNOSIS — H409 Unspecified glaucoma: Secondary | ICD-10-CM | POA: Diagnosis not present

## 2020-10-29 DIAGNOSIS — Z510 Encounter for antineoplastic radiation therapy: Secondary | ICD-10-CM | POA: Diagnosis not present

## 2020-10-29 DIAGNOSIS — I10 Essential (primary) hypertension: Secondary | ICD-10-CM | POA: Diagnosis not present

## 2020-10-29 DIAGNOSIS — C3481 Malignant neoplasm of overlapping sites of right bronchus and lung: Secondary | ICD-10-CM | POA: Diagnosis not present

## 2020-10-29 NOTE — Telephone Encounter (Signed)
Spoke to Dr Cory Munch at [facility] needed verbal order for OT 1W8. Per Dr Mitchel Honour, [facility].

## 2020-10-29 NOTE — Telephone Encounter (Signed)
Spoke with patient's sister [name], briefly, regarding the Palliative referral and patient was there with the sister for the call and she asked if patient was in agreement with starting Palliative services and he said yes.  Patient has an appointment with his PCP tomorrow and they would like for me to call them back on 10/31/20 to give them a chance to discuss Palliative services with the MD.  Told sister that I would f/u with them on 10/31/20.

## 2020-10-29 NOTE — Telephone Encounter (Signed)
    [place] Name: [place] Name: Advanced Callback Phone #: [phone] Service Requested: OT Frequency of Visits: [phone]

## 2020-10-30 ENCOUNTER — Ambulatory Visit
Admission: RE | Admit: 2020-10-30 | Discharge: 2020-10-30 | Disposition: A | Discharge status: Home / Self Care | Payer: Medicare Other | Source: Ambulatory Visit | Attending: Radiation Oncology | Admitting: Radiation Oncology

## 2020-10-30 ENCOUNTER — Ambulatory Visit (INDEPENDENT_AMBULATORY_CARE_PROVIDER_SITE_OTHER): Payer: Medicare Other | Admitting: Emergency Medicine

## 2020-10-30 ENCOUNTER — Other Ambulatory Visit: Payer: Self-pay

## 2020-10-30 ENCOUNTER — Encounter: Payer: Self-pay | Admitting: Emergency Medicine

## 2020-10-30 VITALS — BP 116/78 | HR 61 | Temp 98.3°F | Ht 66.0 in | Wt 159.0 lb

## 2020-10-30 DIAGNOSIS — C3490 Malignant neoplasm of unspecified part of unspecified bronchus or lung: Secondary | ICD-10-CM

## 2020-10-30 DIAGNOSIS — E1169 Type 2 diabetes mellitus with other specified complication: Secondary | ICD-10-CM

## 2020-10-30 DIAGNOSIS — E1142 Type 2 diabetes mellitus with diabetic polyneuropathy: Secondary | ICD-10-CM

## 2020-10-30 DIAGNOSIS — C7931 Secondary malignant neoplasm of brain: Secondary | ICD-10-CM

## 2020-10-30 DIAGNOSIS — E785 Hyperlipidemia, unspecified: Secondary | ICD-10-CM | POA: Diagnosis not present

## 2020-10-30 DIAGNOSIS — Z923 Personal history of irradiation: Secondary | ICD-10-CM

## 2020-10-30 DIAGNOSIS — Z09 Encounter for follow-up examination after completed treatment for conditions other than malignant neoplasm: Secondary | ICD-10-CM

## 2020-10-30 DIAGNOSIS — B351 Tinea unguium: Secondary | ICD-10-CM | POA: Diagnosis not present

## 2020-10-30 DIAGNOSIS — C3411 Malignant neoplasm of upper lobe, right bronchus or lung: Secondary | ICD-10-CM | POA: Diagnosis not present

## 2020-10-30 DIAGNOSIS — I152 Hypertension secondary to endocrine disorders: Secondary | ICD-10-CM | POA: Diagnosis not present

## 2020-10-30 DIAGNOSIS — E1159 Type 2 diabetes mellitus with other circulatory complications: Secondary | ICD-10-CM | POA: Diagnosis not present

## 2020-10-30 DIAGNOSIS — I700 Atherosclerosis of aorta: Secondary | ICD-10-CM

## 2020-10-30 DIAGNOSIS — Z510 Encounter for antineoplastic radiation therapy: Secondary | ICD-10-CM | POA: Diagnosis not present

## 2020-10-30 HISTORY — DX: Personal history of irradiation: Z92.3

## 2020-10-30 LAB — COMPREHENSIVE METABOLIC PANEL
ALT: 50 U/L (ref 0–53)
AST: 11 U/L (ref 0–37)
Albumin: 3.7 g/dL (ref 3.5–5.2)
Alkaline Phosphatase: 59 U/L (ref 39–117)
BUN: 27 mg/dL — ABNORMAL HIGH (ref 6–23)
CO2: 29 mEq/L (ref 19–32)
Calcium: 9.2 mg/dL (ref 8.4–10.5)
Chloride: 99 mEq/L (ref 96–112)
Creatinine, Ser: 0.85 mg/dL (ref 0.40–1.50)
GFR: 86.26 mL/min (ref >60.00)
Glucose, Bld: 217 mg/dL — ABNORMAL HIGH (ref 70–99)
Potassium: 4.7 mEq/L (ref 3.5–5.1)
Sodium: 135 mEq/L (ref 135–145)
Total Bilirubin: 0.5 mg/dL (ref 0.2–1.2)
Total Protein: 6.1 g/dL (ref 6.0–8.3)

## 2020-10-30 LAB — CBC WITH DIFFERENTIAL/PLATELET
Basophils Absolute: 0 10*3/uL (ref 0.0–0.1)
Basophils Relative: 0.2 % (ref 0.0–3.0)
Eosinophils Absolute: 0 10*3/uL (ref 0.0–0.7)
Eosinophils Relative: 0 % (ref 0.0–5.0)
HCT: 39.4 % (ref 39.0–52.0)
Hemoglobin: 13.3 g/dL (ref 13.0–17.0)
Lymphocytes Relative: 5 % — ABNORMAL LOW (ref 12.0–46.0)
Lymphs Abs: 0.6 10*3/uL — ABNORMAL LOW (ref 0.7–4.0)
MCHC: 33.6 g/dL (ref 30.0–36.0)
MCV: 91.7 fl (ref 78.0–100.0)
Monocytes Absolute: 0.7 10*3/uL (ref 0.1–1.0)
Monocytes Relative: 5.7 % (ref 3.0–12.0)
Neutro Abs: 10.3 10*3/uL — ABNORMAL HIGH (ref 1.4–7.7)
Neutrophils Relative %: 89.1 % — ABNORMAL HIGH (ref 43.0–77.0)
Platelets: 231 10*3/uL (ref 150.0–400.0)
RBC: 4.3 Mil/uL (ref 4.22–5.81)
RDW: 12.8 % (ref 11.5–15.5)
WBC: 11.5 10*3/uL — ABNORMAL HIGH (ref 4.0–10.5)

## 2020-10-30 LAB — HEMOGLOBIN A1C: Hgb A1c MFr Bld: 8.4 % — ABNORMAL HIGH (ref 4.6–6.5)

## 2020-10-30 NOTE — Assessment & Plan Note (Signed)
Presently getting treatment.  Sees oncologist on a regular basis.  Stable.

## 2020-10-30 NOTE — Assessment & Plan Note (Signed)
Well-controlled hypertension.  Continue lisinopril 40 mg and amlodipine 10 mg daily. Will check A1c today.  Continue metformin and glipizide. Also on Januvia 100 mg daily.  Eating well.  Good appetite. Staying well-hydrated.

## 2020-10-30 NOTE — Progress Notes (Signed)
Benjamin Willis. 75 y.o.   Chief Complaint  Patient presents with   Cough    Follow up ED visit 10/11/2020, per patient no Covid test. Also, patient was admitted to IP at [facility] transferred to [facility].    HISTORY OF PRESENT ILLNESS: This is a 75 y.o. male with history of metastatic lung cancer.  Here for recent hospital stay follow-up. Discharge summary as follows: Physician Discharge Summary  Benjamin Willis. [id number] DOB: 1945-07-16 DOA: 10/15/2020   PCP: Horald Pollen, MD   Admit date: 10/15/2020 Discharge date: 10/23/2020   Time spent: 40 minutes   Recommendations for Outpatient Follow-up:  Follow outpatient CBC/CMP Follow with radiation oncology and oncology outpatient Follow with neuro oncology outpatient Follow steroid taper per radiation oncology Follow blood sugars, if hyperglycemia, may need to increase glipizide vs starting insulin - expect it should improve with tapering steroids eventually Follow LFT's outpatient - holding crestor    Discharge Diagnoses:  Principal Problem:   Metastatic lung cancer (metastasis from lung to other site) [facility]) Active Problems:   Goals of care, counseling/discussion   Dyslipidemia associated with type 2 diabetes mellitus ([place])   Hypertension associated with type 2 diabetes mellitus ([place])   Diabetes mellitus type 2 in nonobese ([place])   Glaucoma   Metastatic cancer to brain [facility])   Palliative care by specialist     Discharge Condition: stable   Diet recommendation: heart healthy   Doing well.  Here today with his daughter.  Using walker. Good appetite.  Sleeping well.  No pain.  Moving bowels well and urinating well. No injuries.  No falls.  Cough Pertinent negatives include no chest pain, chills, fever or rash.    Prior to Admission medications   Medication Sig Start Date End Date Taking? Authorizing Provider  albuterol (VENTOLIN HFA) 108 (90 Base) MCG/ACT inhaler Inhale 2 puffs into the lungs every 6  (six) hours as needed for wheezing or shortness of breath. 01/19/19  Yes Icard, Bradley L, DO  amLODipine (NORVASC) 10 MG tablet Take 1 tablet (10 mg total) by mouth daily. 12/06/19  Yes Shontell Prosser, Ines Bloom[place], MD  cetirizine (ZYRTEC) 10 MG tablet Take 1 tablet (10 mg total) by mouth daily. 09/03/17  Yes Jaynee Eagles, PA-C  dexamethasone (DECADRON) 4 MG tablet Take 1 tablet (4 mg total) by mouth every 6 (six) hours for 14 days. Follow up with radiation oncology for taper. 10/23/20 11/06/20 Yes Elodia Florence., MD  gabapentin (NEURONTIN) 300 MG capsule Take 1 capsule (300 mg total) by mouth at bedtime. 10/23/20 11/22/20 Yes Elodia Florence., MD  glipiZIDE (GLUCOTROL) 5 MG tablet TAKE 1 TABLET BY MOUTH TWICE DAILY WITH A MEAL 10/08/20  Yes Rosezella Kronick, [place], MD  latanoprost (XALATAN) 0.005 % ophthalmic solution Place 1 drop into both eyes at bedtime. 01/24/20  Yes [provider]  levETIRAcetam (KEPPRA) 1000 MG tablet Take 1 tablet (1,000 mg total) by mouth 2 (two) times daily. 10/23/20 11/22/20 Yes Elodia Florence., MD  Multiple Vitamins-Minerals (MULTIVITAMIN MEN) TABS Take 1 tablet by mouth daily.   Yes [provider]  pantoprazole (PROTONIX) 40 MG tablet Take 1 tablet (40 mg total) by mouth daily. 10/24/20 11/23/20 Yes Elodia Florence., MD  allopurinol (ZYLOPRIM) 100 MG tablet Take 100 mg by mouth daily. Patient not taking: Reported on 10/30/2020 07/31/20   [provider]  blood glucose meter kit and supplies KIT Dispense based on patient and insurance preference. Use up  to four times daily as directed. 10/23/20   Elodia Florence., MD  Blood Pressure Monitoring (BLOOD PRESSURE MONITOR/ARM) DEVI Check BP once a day. DX I 10 04/18/19   Horald Pollen, MD  lisinopril (ZESTRIL) 40 MG tablet Take 1 tablet (40 mg total) by mouth daily. 12/06/19 10/16/20  Horald Pollen, MD  metFORMIN (GLUCOPHAGE) 500 MG tablet Take 1 tablet (500 mg total) by mouth 2  (two) times daily with a meal. Take 1 tablet by mouth once daily with breakfast 03/01/20 10/16/20  Horald Pollen, MD  sitaGLIPtin (JANUVIA) 100 MG tablet Take 1 tablet (100 mg total) by mouth daily. 05/31/20 10/16/20  Horald Pollen, MD    Not on File  Patient Active Problem List   Diagnosis Date Noted   Metastatic cancer to brain [facility])    Palliative care by specialist    Metastatic lung cancer (metastasis from lung to other site) [facility]) 10/16/2020   Diabetes mellitus type 2 in nonobese ([place]) 10/16/2020   Glaucoma 10/16/2020   Lung cancer metastatic to brain ([place]) 10/16/2020   Atherosclerosis of aorta ([place]) 05/31/2020   Primary cancer of right upper lobe of lung ([place]) 02/07/2019   Lung nodule 02/02/2019   Hypertension associated with type 2 diabetes mellitus ([place]) 05/24/2018   Dyslipidemia 05/24/2018   Dyslipidemia associated with type 2 diabetes mellitus ([place]) 06/09/2017   Goals of care, counseling/discussion 01/01/2017    Past Medical History:  Diagnosis Date   Allergy    Diabetes mellitus without complication ([place])    Type II   Glaucoma 10/16/2020   Hyperlipidemia    Hypertension    Lung cancer ([place])    Stage IA2 (cT1b, N0) non-small cell lung cancer (adenocarcinoma)    Past Surgical History:  Procedure Laterality Date   ELECTROMAGNETIC NAVIGATION BROCHOSCOPY Right 02/02/2019   Procedure: VIDEO BRONCHOSCOPY WITH NAVIGATION AND FIDUCIAL PLACEMENT;  Surgeon: Garner Nash, DO;  Location: [place];  Service: Cardiopulmonary;  Laterality: Right;   FUDUCIAL PLACEMENT Right 02/02/2019   Procedure: Placement Of Fuducial Right Upper Lobe;  Surgeon: Garner Nash, DO;  Location: [place];  Service: Cardiopulmonary;  Laterality: Right;   none     VIDEO BRONCHOSCOPY WITH RADIAL ENDOBRONCHIAL ULTRASOUND Right 02/02/2019   Procedure: Video Bronchoscopy With Radial Endobronchial Ultrasound;  Surgeon: Garner Nash, DO;  Location: [place];  Service: Cardiopulmonary;   Laterality: Right;    Social History   Socioeconomic History   Marital status: Single    Spouse name: Not on file   Number of children: 0   Years of education: Not on file   Highest education level: 9th grade  Occupational History   Not on file  Tobacco Use   Smoking status: Former    Packs/day: 1.00    Years: 48.00    Pack years: 48.00    Types: Cigarettes    Quit date: 02/20/2015    Years since quitting: 5.6   Smokeless tobacco: Former    Quit date: 10/2013  Vaping Use   Vaping Use: Never used  Substance and Sexual Activity   Alcohol use: No   Drug use: No   Sexual activity: Yes    Birth control/protection: Condom  Other Topics Concern   Not on file  Social History Narrative   Not on file   Social Determinants of Health   Financial Resource Strain: Not on file  Food Insecurity: Not on file  Transportation Needs: Not on file  Physical Activity: Not on  file  Stress: Not on file  Social Connections: Not on file  Intimate Partner Violence: Not on file    Family History  Problem Relation Age of Onset   Lung cancer Neg Hx      Review of Systems  Constitutional: Negative.  Negative for chills and fever.  HENT: Negative.    Respiratory:  Positive for cough.   Cardiovascular: Negative.  Negative for chest pain and palpitations.  Gastrointestinal:  Negative for abdominal pain, diarrhea, nausea and vomiting.  Genitourinary:  Negative for dysuria and hematuria.  Skin: Negative.  Negative for rash.  All other systems reviewed and are negative.  Today's Vitals   10/30/20 1005  BP: 116/78  Pulse: 61  Temp: 98.3 F (36.8 C)  TempSrc: Oral  SpO2: 94%  Weight: 159 lb (72.1 kg)  Height: $Remove'5\' 6"'wjFkCIX$  (1.676 m)   Body mass index is 25.66 kg/m.  Physical Exam Vitals reviewed.  Constitutional:      Appearance: Normal appearance.  HENT:     Head: Normocephalic.  Eyes:     Extraocular Movements: Extraocular movements intact.     Pupils: Pupils are equal, round,  and reactive to light.  Cardiovascular:     Rate and Rhythm: Normal rate and regular rhythm.     Pulses: Normal pulses.     Heart sounds: Normal heart sounds.  Pulmonary:     Effort: Pulmonary effort is normal.     Breath sounds: Normal breath sounds.  Abdominal:     Palpations: Abdomen is soft.     Tenderness: There is no abdominal tenderness.  Musculoskeletal:     Cervical back: No tenderness.  Lymphadenopathy:     Cervical: No cervical adenopathy.  Skin:    General: Skin is warm and dry.     Capillary Refill: Capillary refill takes less than 2 seconds.     Comments: Onychomycosis of multiple digits on both feet Mild swelling to left foot  Neurological:     General: No focal deficit present.     Mental Status: He is alert and oriented to person, place, and time.  Psychiatric:        Mood and Affect: Mood normal.        Behavior: Behavior normal.     ASSESSMENT & PLAN: Hypertension associated with type 2 diabetes mellitus ([place]) Well-controlled hypertension.  Continue lisinopril 40 mg and amlodipine 10 mg daily. Will check A1c today.  Continue metformin and glipizide. Also on Januvia 100 mg daily.  Eating well.  Good appetite. Staying well-hydrated.  Metastatic lung cancer (metastasis from lung to other site) [facility]) Presently getting treatment.  Sees oncologist on a regular basis.  Stable.  Metastatic cancer to brain ([place]) Normal mental status.  No seizures. Wright was seen today for cough.  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 Falls are a major cause of broken bones and head injuries in people who are older than age 49. Getting regular preventive care can help to keep you healthy and well. Preventive care includes getting regular testing and making lifestyle changes as recommended by your health care provider. Talk with your health care provider about: Which screenings and tests you should have. A screening is a test that checks for a disease when you have no symptoms. A diet and exercise plan that is right for you. What should I know about screenings and tests to prevent falls? Screening and testing are the best ways to find a health problem early. Early diagnosis and treatment give you the best chance of managing medical conditions that are common after age 83. Certain conditions and lifestyle choices may make you more likely to have a fall. Your health care provider may recommend: Regular vision checks. Poor vision and conditions such as cataracts can make you more likely to have a fall. If you wear glasses, make sure to get your prescription updated if your vision changes. Medicine review. Work with your health care provider to regularly review all of the medicines you are taking, including over-the-counter medicines. Ask your health care provider about any side effects that may make you more likely to have a fall. Tell your health care provider if any medicines that you take make you feel dizzy or sleepy. Osteoporosis screening. Osteoporosis is a condition that causes the bones to get weaker. This can make the bones weak and cause them to break more easily. Blood pressure screening. Blood pressure changes and medicines to control blood  pressure can make you feel dizzy. Strength and balance checks. Your health care provider may recommend certain tests to check your strength and balance while standing, walking, or changing positions. Foot health exam. Foot pain and numbness, as well as not wearing proper footwear, can make you more likely to have a fall. Depression screening. You may be more likely to have a fall if you have a fear of falling, feel emotionally low, or feel unable to do activities that you used to do. Alcohol use screening. Using too much alcohol can affect your balance and may make you more likely to have a fall. What actions can I take to lower my risk of falls? General instructions Talk with your health care provider about your risks for falling. Tell your health care provider if: You fall. Be sure to tell your health care provider about all falls, even ones that seem minor. You feel dizzy, sleepy, or off-balance. Take over-the-counter and prescription medicines only as told by your health care provider. These include any supplements. Eat a healthy diet and maintain a healthy weight. A healthy diet includes low-fat dairy products, low-fat (lean) meats, and fiber from whole grains, beans, and lots of fruits and vegetables. Home safety Remove any tripping hazards, such as rugs, cords, and clutter. Install safety equipment such as grab bars in bathrooms and safety rails on stairs. Keep rooms and walkways well-lit. Activity  Follow a regular exercise program to stay fit. This will help you maintain your balance. Ask your health care provider what types of exercise are appropriate for you. If you need a cane or walker, use it as recommended by your health care provider. Wear supportive shoes that have nonskid soles.  Lifestyle Do not drink alcohol if your health care provider tells you not to drink. If you drink alcohol, limit how much  you have: 0-1 drink a day for women. 0-2 drinks a day for men. Be aware of  how much alcohol is in your drink. In the [place] one drink equals one typical bottle of beer (12 oz), one-half glass of wine (5 oz), or one shot of hard liquor (1 oz). Do not use any products that contain nicotine or tobacco, such as cigarettes and e-cigarettes. If you need help quitting, ask your health care provider. Summary Having a healthy lifestyle and getting preventive care can help to protect your health and wellness after age 38. Screening and testing are the best way to find a health problem early and help you avoid having a fall. Early diagnosis and treatment give you the best chance for managing medical conditions that are more common for people who are older than age 78. Falls are a major cause of broken bones and head injuries in people who are older than age 63. Take precautions to prevent a fall at home. Work with your health care provider to learn what changes you can make to improve your health and wellness and to prevent falls. This information is not intended to replace advice given to you by your health care provider. Make sure you discuss any questions you have with your healthcare provider. Document Revised: 04/06/2020 Document Reviewed: 04/06/2020 Elsevier Patient Education  [address], MD [facility]

## 2020-10-30 NOTE — Progress Notes (Signed)
Dexamethasone taper called in per Dr. Gery Pray. 4mg  BID x 5 days, then 4 mg qd x 5 days, then 2mg   qd x 5 days, then 1mg   qod x 5 days then dc. Called medication into [facility] on [address]. Also called and spoke with patients sister to make aware of dosage change and taper, Sister Deanna voiced understanding.

## 2020-10-30 NOTE — Patient Instructions (Signed)

## 2020-10-30 NOTE — Assessment & Plan Note (Signed)
Normal mental status.  No seizures.

## 2020-10-31 ENCOUNTER — Other Ambulatory Visit: Payer: Self-pay | Admitting: Emergency Medicine

## 2020-10-31 ENCOUNTER — Ambulatory Visit: Payer: Medicare Other | Admitting: Emergency Medicine

## 2020-10-31 ENCOUNTER — Telehealth: Payer: Self-pay | Admitting: Student

## 2020-10-31 DIAGNOSIS — H409 Unspecified glaucoma: Secondary | ICD-10-CM | POA: Diagnosis not present

## 2020-10-31 DIAGNOSIS — E785 Hyperlipidemia, unspecified: Secondary | ICD-10-CM | POA: Diagnosis not present

## 2020-10-31 DIAGNOSIS — C3481 Malignant neoplasm of overlapping sites of right bronchus and lung: Secondary | ICD-10-CM | POA: Diagnosis not present

## 2020-10-31 DIAGNOSIS — I10 Essential (primary) hypertension: Secondary | ICD-10-CM | POA: Diagnosis not present

## 2020-10-31 DIAGNOSIS — E1169 Type 2 diabetes mellitus with other specified complication: Secondary | ICD-10-CM | POA: Diagnosis not present

## 2020-10-31 DIAGNOSIS — C7931 Secondary malignant neoplasm of brain: Secondary | ICD-10-CM | POA: Diagnosis not present

## 2020-10-31 NOTE — Telephone Encounter (Signed)
Attempted to contact patient's sister, Jonn Shingles, to f/u with them to see if they were in agreement with scheduling a Palliative Consult for the patient, no answer - unable to leave a message due to no voicemail set up.

## 2020-11-01 ENCOUNTER — Other Ambulatory Visit: Payer: Self-pay | Admitting: Hematology and Oncology

## 2020-11-01 ENCOUNTER — Other Ambulatory Visit: Payer: Self-pay

## 2020-11-01 ENCOUNTER — Telehealth: Payer: Self-pay | Admitting: Emergency Medicine

## 2020-11-01 ENCOUNTER — Inpatient Hospital Stay: Payer: Medicare Other

## 2020-11-01 ENCOUNTER — Inpatient Hospital Stay: Payer: Medicare Other | Attending: Hematology and Oncology | Admitting: Hematology and Oncology

## 2020-11-01 VITALS — BP 144/74 | HR 75 | Temp 98.3°F | Resp 17 | Ht 66.0 in | Wt 162.6 lb

## 2020-11-01 DIAGNOSIS — E1169 Type 2 diabetes mellitus with other specified complication: Secondary | ICD-10-CM | POA: Diagnosis not present

## 2020-11-01 DIAGNOSIS — Z87891 Personal history of nicotine dependence: Secondary | ICD-10-CM | POA: Diagnosis not present

## 2020-11-01 DIAGNOSIS — C3411 Malignant neoplasm of upper lobe, right bronchus or lung: Secondary | ICD-10-CM

## 2020-11-01 DIAGNOSIS — C7931 Secondary malignant neoplasm of brain: Secondary | ICD-10-CM | POA: Insufficient documentation

## 2020-11-01 DIAGNOSIS — C3491 Malignant neoplasm of unspecified part of right bronchus or lung: Secondary | ICD-10-CM | POA: Diagnosis not present

## 2020-11-01 DIAGNOSIS — C3490 Malignant neoplasm of unspecified part of unspecified bronchus or lung: Secondary | ICD-10-CM

## 2020-11-01 DIAGNOSIS — C3481 Malignant neoplasm of overlapping sites of right bronchus and lung: Secondary | ICD-10-CM | POA: Diagnosis not present

## 2020-11-01 DIAGNOSIS — I10 Essential (primary) hypertension: Secondary | ICD-10-CM | POA: Diagnosis not present

## 2020-11-01 DIAGNOSIS — Z801 Family history of malignant neoplasm of trachea, bronchus and lung: Secondary | ICD-10-CM | POA: Diagnosis not present

## 2020-11-01 DIAGNOSIS — E785 Hyperlipidemia, unspecified: Secondary | ICD-10-CM | POA: Diagnosis not present

## 2020-11-01 DIAGNOSIS — H409 Unspecified glaucoma: Secondary | ICD-10-CM | POA: Diagnosis not present

## 2020-11-01 LAB — CMP (CANCER CENTER ONLY)
ALT: 44 U/L (ref 0–44)
AST: 9 U/L — ABNORMAL LOW (ref 15–41)
Albumin: 3.2 g/dL — ABNORMAL LOW (ref 3.5–5.0)
Alkaline Phosphatase: 66 U/L (ref 38–126)
Anion gap: 11 (ref 5–15)
BUN: 29 mg/dL — ABNORMAL HIGH (ref 8–23)
CO2: 26 mmol/L (ref 22–32)
Calcium: 9.3 mg/dL (ref 8.9–10.3)
Chloride: 101 mmol/L (ref 98–111)
Creatinine: 1.16 mg/dL (ref 0.61–1.24)
GFR, Estimated: >60 mL/min (ref >60)
Glucose, Bld: 362 mg/dL — ABNORMAL HIGH (ref 70–99)
Potassium: 4.9 mmol/L (ref 3.5–5.1)
Sodium: 138 mmol/L (ref 135–145)
Total Bilirubin: 0.5 mg/dL (ref 0.3–1.2)
Total Protein: 6 g/dL — ABNORMAL LOW (ref 6.5–8.1)

## 2020-11-01 LAB — CBC WITH DIFFERENTIAL (CANCER CENTER ONLY)
Abs Immature Granulocytes: 0.08 10*3/uL — ABNORMAL HIGH (ref 0.00–0.07)
Basophils Absolute: 0 10*3/uL (ref 0.0–0.1)
Basophils Relative: 0 %
Eosinophils Absolute: 0 10*3/uL (ref 0.0–0.5)
Eosinophils Relative: 0 %
HCT: 36.6 % — ABNORMAL LOW (ref 39.0–52.0)
Hemoglobin: 12.8 g/dL — ABNORMAL LOW (ref 13.0–17.0)
Immature Granulocytes: 1 %
Lymphocytes Relative: 2 %
Lymphs Abs: 0.3 10*3/uL — ABNORMAL LOW (ref 0.7–4.0)
MCH: 31 pg (ref 26.0–34.0)
MCHC: 35 g/dL (ref 30.0–36.0)
MCV: 88.6 fL (ref 80.0–100.0)
Monocytes Absolute: 0.5 10*3/uL (ref 0.1–1.0)
Monocytes Relative: 5 %
Neutro Abs: 10.7 10*3/uL — ABNORMAL HIGH (ref 1.7–7.7)
Neutrophils Relative %: 92 %
Platelet Count: 229 10*3/uL (ref 150–400)
RBC: 4.13 MIL/uL — ABNORMAL LOW (ref 4.22–5.81)
RDW: 11.8 % (ref 11.5–15.5)
WBC Count: 11.6 10*3/uL — ABNORMAL HIGH (ref 4.0–10.5)
nRBC: 0 % (ref 0.0–0.2)

## 2020-11-01 NOTE — Telephone Encounter (Signed)
   [place] Name: [facility] Agency Name: Advanced Callback Phone #: [phone] Service Requested: PT  Frequency of Visits: 1W2, 2W2, 1W5

## 2020-11-01 NOTE — Progress Notes (Signed)
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  This was followed by an MRI of the brain with and without contrast which showed 4 brain lesions consistent with metastases, extensive vasogenic edema associated with the posterior right frontal lesion without midline shift. 6/15-6/28/2022: 10 fractions of WBRT with Rad/Onc  Interval History:  [name]. 75 y.o. 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 The patient's last visit was on 10/16/2020 at which time he was admitted in the hospital. In the interim since the last visit he has completed radiation therapy.   On exam today Mrs. Luchsinger his sister.  He reports that he is tolerating the radiation therapy quite well.  He notes he is not been having any difficulties with headache or vision changes.  He reports that he has been working with a therapist in order to help him improve his ambulation.  His energy is good his appetite is good and his weight has been increasing.  He notes that he does have a cough productive for whitish sputum bassoon going on for about 2 weeks.  He notes that he does not cough up very often.  It does be worse when he tries to lay flat.  He otherwise denies any fevers, chills, sweats, nausea, vomiting or  diarrhea.  A full 10 point ROS is listed below.  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   Glaucoma 10/16/2020   Hyperlipidemia    Hypertension    Lung cancer ([name])    Stage IA2 (cT1b, N0) non-small cell lung cancer (adenocarcinoma)    SURGICAL HISTORY: Past Surgical History:  Procedure Laterality Date   ELECTROMAGNETIC NAVIGATION BROCHOSCOPY Right 02/02/2019   Procedure: VIDEO BRONCHOSCOPY WITH NAVIGATION AND FIDUCIAL PLACEMENT;  Surgeon: Garner Nash, DO;  Location: [facility];  Service: Cardiopulmonary;  Laterality: Right;   FUDUCIAL PLACEMENT Right 02/02/2019   Procedure: Placement Of Fuducial Right Upper Lobe;  Surgeon: Garner Nash, DO;  Location: [facility];  Service: Cardiopulmonary;  Laterality: Right;   none     VIDEO BRONCHOSCOPY WITH RADIAL ENDOBRONCHIAL ULTRASOUND Right 02/02/2019   Procedure: Video Bronchoscopy With Radial Endobronchial Ultrasound;  Surgeon: Garner Nash, DO;  Location: [facility];  Service: Cardiopulmonary;  Laterality: Right;    SOCIAL HISTORY: Social History   Socioeconomic History   Marital status: Single    Spouse name: Not on file   Number of children: 0   Years of education: Not on file   Highest education level: 9th grade  Occupational History   Not on file  Tobacco Use   Smoking status: Former    Packs/day: 1.00    Years: 48.00    Pack years: 48.00    Types: Cigarettes    Quit date: 02/20/2015    Years since quitting: 5.7  Smokeless tobacco: Former    Quit date: 10/2013  Vaping Use   Vaping Use: Never used  Substance and Sexual Activity   Alcohol use: No   Drug use: No   Sexual activity: Yes    Birth control/protection: Condom  Other Topics Concern   Not on file  Social History Narrative   Not on file   Social Determinants of Health   Financial Resource Strain: Not on file  Food Insecurity: Not on file  Transportation Needs: Not on file   Physical Activity: Not on file  Stress: Not on file  Social Connections: Not on file  Intimate Partner Violence: Not on file    FAMILY HISTORY: Family History  Problem Relation Age of Onset   Lung cancer Neg Hx     ALLERGIES:  has no allergies on file.  MEDICATIONS:  Current Outpatient Medications  Medication Sig Dispense Refill   albuterol (VENTOLIN HFA) 108 (90 Base) MCG/ACT inhaler Inhale 2 puffs into the lungs every 6 (six) hours as needed for wheezing or shortness of breath. 18 g 6   allopurinol (ZYLOPRIM) 100 MG tablet Take 100 mg by mouth daily.     amLODipine (NORVASC) 10 MG tablet Take 1 tablet (10 mg total) by mouth daily. 90 tablet 3   blood glucose meter kit and supplies KIT Dispense based on patient and insurance preference. Use up to four times daily as directed. 1 each 0   Blood Pressure Monitoring (BLOOD PRESSURE MONITOR/ARM) DEVI Check BP once a day. DX I 10 1 each 0   cetirizine (ZYRTEC) 10 MG tablet Take 1 tablet (10 mg total) by mouth daily. 30 tablet 11   dexamethasone (DECADRON) 2 MG tablet Take by mouth.     gabapentin (NEURONTIN) 300 MG capsule Take 1 capsule (300 mg total) by mouth at bedtime. 30 capsule 0   glipiZIDE (GLUCOTROL) 5 MG tablet TAKE 1 TABLET BY MOUTH TWICE DAILY WITH A MEAL 180 tablet 0   latanoprost (XALATAN) 0.005 % ophthalmic solution Place 1 drop into both eyes at bedtime.     levETIRAcetam (KEPPRA) 1000 MG tablet Take 1 tablet (1,000 mg total) by mouth 2 (two) times daily. 60 tablet 0   lisinopril (ZESTRIL) 40 MG tablet Take 1 tablet (40 mg total) by mouth daily. 90 tablet 3   metFORMIN (GLUCOPHAGE) 500 MG tablet Take 1 tablet (500 mg total) by mouth 2 (two) times daily with a meal. Take 1 tablet by mouth once daily with breakfast 180 tablet 1   Multiple Vitamins-Minerals (MULTIVITAMIN MEN) TABS Take 1 tablet by mouth daily.     pantoprazole (PROTONIX) 40 MG tablet Take 1 tablet (40 mg total) by mouth daily. 30 tablet 0   sitaGLIPtin  (JANUVIA) 100 MG tablet Take 1 tablet (100 mg total) by mouth daily. 90 tablet 3   No current facility-administered medications for this visit.    REVIEW OF SYSTEMS:   Constitutional: ( - ) fevers, ( - )  chills , ( - ) night sweats Eyes: ( - ) blurriness of vision, ( - ) double vision, ( - ) watery eyes Ears, nose, mouth, throat, and face: ( - ) mucositis, ( - ) sore throat Respiratory: ( - ) cough, ( - ) dyspnea, ( - ) wheezes Cardiovascular: ( - ) palpitation, ( - ) chest discomfort, ( - ) lower extremity swelling Gastrointestinal:  ( - ) nausea, ( - ) heartburn, ( - ) change in bowel habits Skin: ( - ) abnormal  skin rashes Lymphatics: ( - ) new lymphadenopathy, ( - ) easy bruising Neurological: ( - ) numbness, ( - ) tingling, ( - ) new weaknesses Behavioral/Psych: ( - ) mood change, ( - ) new changes  All other systems were reviewed with the patient and are negative.  PHYSICAL EXAMINATION:  Vitals:   11/01/20 1136  BP: (!) 144/74  Pulse: 75  Resp: 17  Temp: 98.3 F (36.8 C)  SpO2: 100%   Filed Weights   11/01/20 1136  Weight: 162 lb 9.6 oz (73.8 kg)    GENERAL: Well-appearing elderly African-American male, alert, no distress and comfortable SKIN: skin color, texture, turgor are normal, no rashes or significant lesions EYES: conjunctiva are pink and non-injected, sclera clear LUNGS: clear to auscultation and percussion with normal breathing effort HEART: regular rate & rhythm and no murmurs and no lower extremity edema PSYCH: alert & oriented x 3, fluent speech NEURO: no focal motor/sensory deficits  LABORATORY DATA:  I have reviewed the data as listed CBC Latest Ref Rng & Units 11/01/2020 10/30/2020 10/23/2020  WBC 4.0 - 10.5 K/uL 11.6(H) 11.5(H) 8.3  Hemoglobin 13.0 - 17.0 g/dL 12.8(L) 13.3 12.7(L)  Hematocrit 39.0 - 52.0 % 36.6(L) 39.4 39.0  Platelets 150 - 400 K/uL 229 231.0 184    CMP Latest Ref Rng & Units 11/01/2020 10/30/2020 10/23/2020  Glucose 70 - 99 mg/dL  362(H) 217(H) 211(H)  BUN 8 - 23 mg/dL 29(H) 27(H) 32(H)  Creatinine 0.61 - 1.24 mg/dL 1.16 0.85 0.92  Sodium 135 - 145 mmol/L 138 135 135  Potassium 3.5 - 5.1 mmol/L 4.9 4.7 4.8  Chloride 98 - 111 mmol/L 101 99 103  CO2 22 - 32 mmol/L _0 Calcium 8.9 - 10.3 mg/dL 9.3 9.2 8.7(L)  Total Protein 6.5 - 8.1 g/dL 6.0(L) 6.1 5.5(L)  Total Bilirubin 0.3 - 1.2 mg/dL 0.5 0.5 0.3  Alkaline Phos 38 - 126 U/L 66 59 57  AST 15 - 41 U/L 9(L) 11 45(H)  ALT 0 - 44 U/L 44 50 102(H)    RADIOGRAPHIC STUDIES:  DG Chest 2 View  Result Date: 10/15/2020 CLINICAL DATA:  75 year old male with weakness. EXAM: CHEST - 2 VIEW COMPARISON:  Chest radiograph dated 10/11/2020. FINDINGS: Shallow inspiration. Focal area of rounded opacity with several associated fiducial markers as seen on the prior radiograph. No new consolidation. There is no pleural effusion or pneumothorax. Mild cardiomegaly. No acute osseous pathology. IMPRESSION: 1. No acute cardiopulmonary process. No interval change in the appearance of the rounded right upper lobe density. 2. Mild cardiomegaly. Electronically Signed   By: Anner Crete M.D.   On: 10/15/2020 20:41   DG Chest 2 View  Result Date: 10/11/2020 CLINICAL DATA:  Cough and dyspnea today. Non-small cell right lung cancer. EXAM: CHEST - 2 VIEW COMPARISON:  02/02/2019 chest radiograph.  01/25/2020 chest CT. FINDINGS: Stable cardiomediastinal silhouette with normal heart size. No pneumothorax. No pleural effusion. Three clustered fiducial markers in the upper right lung with surrounding patchy ill-defined opacity, which appears mildly increased from prior chest CT. Clear left lung. Nipple shadows overlie the lower lungs. IMPRESSION: Patchy ill-defined upper right lung opacity surrounding clustered fiducial markers, mildly increased from prior chest CT, nonspecific, differential including post treatment change, recurrent tumor or pneumonia. Chest CT suggested for further evaluation.  Electronically Signed   By: Ilona Sorrel M.D.   On: 10/11/2020 14:14   CT Head Wo Contrast  Result Date: 10/15/2020 CLINICAL DATA:  Weakness EXAM: CT HEAD  WITHOUT CONTRAST TECHNIQUE: Contiguous axial images were obtained from the base of the skull through the vertex without intravenous contrast. COMPARISON:  None. FINDINGS: Brain: Cystic mass is seen in the right parietal lobe measuring 3.3 cm with surrounding extensive vasogenic edema. Probable cystic mass in the left occipital lobe measuring 2 cm with surrounding edema. Possible additional lesion in the right occipital lobe and right cerebellar hemisphere. Findings suspicious for metastatic disease. No hemorrhage. No midline shift or hydrocephalus. Vascular: No hyperdense vessel or unexpected calcification. Skull: No acute calvarial abnormality. Sinuses/Orbits: No acute findings Other: None IMPRESSION: Multiple apparent cystic lesions within the brain with surrounding vasogenic edema concerning for metastatic disease. Recommend further evaluation with MRI. These results were called by telephone at the time of interpretation on 10/15/2020 at 9:03 pm to provider [facility] , who verbally acknowledged these results. Electronically Signed   By: Rolm Baptise M.D.   On: 10/15/2020 21:05   MR Brain W and Wo Contrast  Result Date: 10/16/2020 CLINICAL DATA:  Left greater than right-sided weakness. Multiple brain lesions on CT. History of lung cancer. EXAM: MRI HEAD WITHOUT AND WITH CONTRAST TECHNIQUE: Multiplanar, multiecho pulse sequences of the brain and surrounding structures were obtained without and with intravenous contrast. CONTRAST:  7.64m GADAVIST GADOBUTROL 1 MMOL/ML IV SOLN COMPARISON:  Head CT 10/15/2020 FINDINGS: Brain: Partially cystic lesions with irregular peripheral enhancement measured 2.2 x 1.7 cm in the posteromedial right cerebellar hemisphere (series 10, image 16), 2.6 x 2.1 cm in the left occipital lobe (series 10, image 27), 2.4 x 2.0  cm in the medial right parietal lobe (series 10, image 32), and 3.3 x 3.1 cm in the posterior right frontal lobe (series 10, image 40). There is a small amount of chronic blood products associated with the lesions. There is extensive vasogenic edema associated with the posterior right frontal lesion and milder edema associated with the other cerebral and cerebellar lesions. There is no midline shift. Scattered small foci of T2 hyperintensity in the cerebral white matter and pons are nonspecific but compatible with mild chronic small vessel ischemic disease. No acute infarct or extra-axial fluid collection is evident. Cerebral volume is within normal limits for age. Vascular: Major intracranial vascular flow voids are preserved. Skull and upper cervical spine: Unremarkable bone marrow signal. Sinuses/Orbits: Mild depression of the right lamina papyracea which may reflect a remote orbital fracture. Paranasal sinuses and mastoid air cells are clear. Other: None. IMPRESSION: 1. Four brain lesions consistent with metastases. Extensive vasogenic edema associated with the posterior right frontal lesion without midline shift. 2. Mild chronic small vessel ischemic disease. Electronically Signed   By: ALogan BoresM.D.   On: 10/16/2020 09:25   CT CHEST ABDOMEN PELVIS W CONTRAST  Result Date: 10/16/2020 CLINICAL DATA:  Brain metastases, staging, history of lung cancer EXAM: CT CHEST, ABDOMEN, AND PELVIS WITH CONTRAST TECHNIQUE: Multidetector CT imaging of the chest, abdomen and pelvis was performed following the standard protocol during bolus administration of intravenous contrast. CONTRAST:  1063mOMNIPAQUE IOHEXOL 300 MG/ML  SOLN COMPARISON:  02/13/2020 FINDINGS: CT CHEST FINDINGS Cardiovascular: Aortic atherosclerosis. Normal heart size. No pericardial effusion. Mediastinum/Nodes: No enlarged mediastinal, hilar, or axillary lymph nodes. Thyroid gland, trachea, and esophagus demonstrate no significant findings.  Lungs/Pleura: Expected interval evolution of radiation fibrosis in the perihilar and suprahilar right lung. A nodular component in the central right upper lobe is minimally decreased in size although more difficult to appreciate against a background of developing fibrosis, measuring approximately 1.4 x 1.3  cm, previously 1.5 x 1.5 cm (series 5, image 51). No pleural effusion or pneumothorax. Musculoskeletal: No chest wall mass or suspicious bone lesions identified. CT ABDOMEN PELVIS FINDINGS Hepatobiliary: No solid liver abnormality is seen. No gallstones, gallbladder wall thickening, or biliary dilatation. Pancreas: Unremarkable. No pancreatic ductal dilatation or surrounding inflammatory changes. Spleen: Normal in size without significant abnormality. Adrenals/Urinary Tract: Stable small right adrenal adenoma, measuring 1.3 x 0.9 cm (series 3, image 61). Kidneys are normal, without renal calculi, solid lesion, or hydronephrosis. Bladder is unremarkable. Stomach/Bowel: Stomach is within normal limits. Appendix appears normal. No evidence of bowel wall thickening, distention, or inflammatory changes. Vascular/Lymphatic: Aortic atherosclerosis. No enlarged abdominal or pelvic lymph nodes. Reproductive: No mass or other abnormality. Other: Small, fat containing bilateral inguinal hernias. No abdominopelvic ascites. Musculoskeletal: No acute or significant osseous findings. IMPRESSION: 1. Expected interval evolution of radiation fibrosis in the perihilar and suprahilar right lung. A nodular component in the central right upper lobe is minimally decreased in size although more difficult to appreciate against a background of developing fibrosis. Other previously described nodules are not well appreciated. Findings are consistent with treatment response and expected post treatment appearance. 2. No evidence of lymphadenopathy or metastatic disease in the chest, abdomen or pelvis. Aortic Atherosclerosis (ICD10-I70.0).  Electronically Signed   By: Eddie Candle M.D.   On: 10/16/2020 10:21    ASSESSMENT & PLAN Anthonymichael Florina Ou. 75 y.o. 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  After review the labs, reviewed records, discussion with the patient the findings are most consistent with metastatic adenocarcinoma with spread to the brain.  Unfortunately we do not have new pathology as the lesions in his brain were not resectable and he underwent radiation therapy instead.  We can presume based off his prior pathology that this represents the original adenocarcinoma.  As such I do believe it is reasonable to proceed with carboplatin, pembrolizumab, pemetrexed therapy in order to help treat any possible micrometastatic disease.  The patient also underwent whole brain radiation therapy instead of SRS.  Therefore I do believe systemic therapy will be reasonable.  This plan of care was discussed with the resident lung expert Dr. Julien Nordmann.  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  If no targetable mutations are reported then would proceed with triple therapy as noted above. --Patient completed whole brain radiation, but did not wish to undergo SRS.  Radiation oncology noted that this may be an option in the future if he were to have recurrence in the brain --Return to clinic in approximately 3 to 4 weeks time in order to discuss next steps moving forward.  No orders of the defined types were placed in this encounter.   All questions were answered. The patient knows to call the clinic with any problems, questions or concerns.  A total of more than 30 minutes were spent on this encounter with face-to-face time and non-face-to-face time,  including preparing to see the patient, ordering tests and/or medications, counseling the patient and coordination of care as outlined above.   Ledell Peoples, MD Department of Hematology/Oncology [place] [facility] Phone: [phone] Pager: [phone] Email: [email or web address]  11/08/2020 7:35 PM

## 2020-11-01 NOTE — Telephone Encounter (Signed)
   Patients sister returned call in regards to lab results.   Phone: [phone]

## 2020-11-02 NOTE — Telephone Encounter (Signed)
Please place PT orders as requested.  Thanks.

## 2020-11-02 NOTE — Telephone Encounter (Signed)
See result note from labs drawn on 10/30/20. Pt denies further ques/concerns at this time.

## 2020-11-02 NOTE — Telephone Encounter (Signed)
Called and spoke with Rachel Moulds, she verbalized understanding.

## 2020-11-06 ENCOUNTER — Other Ambulatory Visit: Payer: Self-pay

## 2020-11-06 ENCOUNTER — Inpatient Hospital Stay: Payer: Medicare Other | Attending: Internal Medicine | Admitting: Internal Medicine

## 2020-11-06 VITALS — BP 131/70 | HR 62 | Temp 97.3°F | Resp 17 | Ht 66.0 in | Wt 156.7 lb

## 2020-11-06 DIAGNOSIS — Z79899 Other long term (current) drug therapy: Secondary | ICD-10-CM | POA: Insufficient documentation

## 2020-11-06 DIAGNOSIS — Z923 Personal history of irradiation: Secondary | ICD-10-CM | POA: Diagnosis not present

## 2020-11-06 DIAGNOSIS — C7931 Secondary malignant neoplasm of brain: Secondary | ICD-10-CM | POA: Diagnosis not present

## 2020-11-06 DIAGNOSIS — C3491 Malignant neoplasm of unspecified part of right bronchus or lung: Secondary | ICD-10-CM | POA: Diagnosis not present

## 2020-11-06 NOTE — Progress Notes (Signed)
[facility] [address]  [place], [place] [address] [phone]   New Patient Evaluation  Date of Service: 11/06/20 Patient Name: Benjamin Willis. Patient MRN: [id number] Patient DOB: 1945/12/09 Provider: Henreitta Leber, MD  Identifying Statement:  Benjamin Willis. is a 75 y.o. male with Brain metastases [facility]) who presents for initial consultation and evaluation regarding cancer associated neurologic deficits.    Referring Provider: Georgina Quint, MD [address] [place],  [place] [address]  Primary Cancer:  Oncologic History: Oncology History  Metastatic lung cancer (metastasis from lung to other site) [facility])  10/16/2020 Initial Diagnosis   Metastatic lung cancer (metastasis from lung to other site) [facility])    11/01/2020 Cancer Staging   Staging form: Lung, AJCC 8th Edition - Clinical stage from 11/01/2020: Stage IVB (cT0, cN0, cM1c) - Signed by Jaci Standard, MD on 11/01/2020  Stage prefix: Initial diagnosis     CNS Oncologic History 10/30/20: Completes WBRT with Dr. Roselind Messier  History of Present Illness: The patient's records from the referring physician were obtained and reviewed and the patient interviewed to confirm this HPI.  Benjamin Willis. presented to neurologic attention on June 13th, 2022 with complaints of left sided weakness.  He describes new difficulty with walking and using his left hand for daily tasks.  He did not have any falls, and remained independent (though Onset of symptoms was 1-2 weeks prior.His lung cancer, previously early stage, had been treated with radiation in both 2020 and for recurrence in 2021.  He is scheduled to consult with Dr. Leonides Schanz for further management of systemic disease.    Medications: Current Outpatient Medications on File Prior to Visit  Medication Sig Dispense Refill   albuterol (VENTOLIN HFA) 108 (90 Base) MCG/ACT inhaler Inhale 2 puffs into the lungs every 6 (six) hours as  needed for wheezing or shortness of breath. 18 g 6   allopurinol (ZYLOPRIM) 100 MG tablet Take 100 mg by mouth daily. (Patient not taking: Reported on 10/30/2020)     amLODipine (NORVASC) 10 MG tablet Take 1 tablet (10 mg total) by mouth daily. 90 tablet 3   blood glucose meter kit and supplies KIT Dispense based on patient and insurance preference. Use up to four times daily as directed. 1 each 0   Blood Pressure Monitoring (BLOOD PRESSURE MONITOR/ARM) DEVI Check BP once a day. DX I 10 1 each 0   cetirizine (ZYRTEC) 10 MG tablet Take 1 tablet (10 mg total) by mouth daily. 30 tablet 11   dexamethasone (DECADRON) 4 MG tablet Take 1 tablet (4 mg total) by mouth every 6 (six) hours for 14 days. Follow up with radiation oncology for taper. 56 tablet 0   gabapentin (NEURONTIN) 300 MG capsule Take 1 capsule (300 mg total) by mouth at bedtime. 30 capsule 0   glipiZIDE (GLUCOTROL) 5 MG tablet TAKE 1 TABLET BY MOUTH TWICE DAILY WITH A MEAL 180 tablet 0   latanoprost (XALATAN) 0.005 % ophthalmic solution Place 1 drop into both eyes at bedtime.     levETIRAcetam (KEPPRA) 1000 MG tablet Take 1 tablet (1,000 mg total) by mouth 2 (two) times daily. 60 tablet 0   lisinopril (ZESTRIL) 40 MG tablet Take 1 tablet (40 mg total) by mouth daily. 90 tablet 3   metFORMIN (GLUCOPHAGE) 500 MG tablet Take 1 tablet (500 mg total) by mouth 2 (two) times daily with a meal. Take 1 tablet by mouth once daily with breakfast  180 tablet 1   Multiple Vitamins-Minerals (MULTIVITAMIN MEN) TABS Take 1 tablet by mouth daily.     pantoprazole (PROTONIX) 40 MG tablet Take 1 tablet (40 mg total) by mouth daily. 30 tablet 0   sitaGLIPtin (JANUVIA) 100 MG tablet Take 1 tablet (100 mg total) by mouth daily. 90 tablet 3   No current facility-administered medications on file prior to visit.    Allergies: Not on File Past Medical History:  Past Medical History:  Diagnosis Date   Allergy    Diabetes mellitus without complication ([place])     Type II   Glaucoma 10/16/2020   Hyperlipidemia    Hypertension    Lung cancer ([place])    Stage IA2 (cT1b, N0) non-small cell lung cancer (adenocarcinoma)   Past Surgical History:  Past Surgical History:  Procedure Laterality Date   ELECTROMAGNETIC NAVIGATION BROCHOSCOPY Right 02/02/2019   Procedure: VIDEO BRONCHOSCOPY WITH NAVIGATION AND FIDUCIAL PLACEMENT;  Surgeon: Garner Nash, DO;  Location: [place];  Service: Cardiopulmonary;  Laterality: Right;   FUDUCIAL PLACEMENT Right 02/02/2019   Procedure: Placement Of Fuducial Right Upper Lobe;  Surgeon: Garner Nash, DO;  Location: [place];  Service: Cardiopulmonary;  Laterality: Right;   none     VIDEO BRONCHOSCOPY WITH RADIAL ENDOBRONCHIAL ULTRASOUND Right 02/02/2019   Procedure: Video Bronchoscopy With Radial Endobronchial Ultrasound;  Surgeon: Garner Nash, DO;  Location: [place];  Service: Cardiopulmonary;  Laterality: Right;   Social History:  Social History   Socioeconomic History   Marital status: Single    Spouse name: Not on file   Number of children: 0   Years of education: Not on file   Highest education level: 9th grade  Occupational History   Not on file  Tobacco Use   Smoking status: Former    Packs/day: 1.00    Years: 48.00    Pack years: 48.00    Types: Cigarettes    Quit date: 02/20/2015    Years since quitting: 5.7   Smokeless tobacco: Former    Quit date: 10/2013  Vaping Use   Vaping Use: Never used  Substance and Sexual Activity   Alcohol use: No   Drug use: No   Sexual activity: Yes    Birth control/protection: Condom  Other Topics Concern   Not on file  Social History Narrative   Not on file   Social Determinants of Health   Financial Resource Strain: Not on file  Food Insecurity: Not on file  Transportation Needs: Not on file  Physical Activity: Not on file  Stress: Not on file  Social Connections: Not on file  Intimate Partner Violence: Not on file   Family History:  Family History   Problem Relation Age of Onset   Lung cancer Neg Hx     Review of Systems: Constitutional: Doesn't report fevers, chills or abnormal weight loss Eyes: Doesn't report blurriness of vision Ears, nose, mouth, throat, and face: Doesn't report sore throat Respiratory: Doesn't report cough, dyspnea or wheezes Cardiovascular: Doesn't report palpitation, chest discomfort  Gastrointestinal:  Doesn't report nausea, constipation, diarrhea GU: Doesn't report incontinence Skin: Doesn't report skin rashes Neurological: Per HPI Musculoskeletal: Doesn't report joint pain Behavioral/Psych: Doesn't report anxiety  Physical Exam: Vitals:   11/06/20 1011  BP: 131/70  Pulse: 62  Resp: 17  Temp: (!) 97.3 F (36.3 C)  SpO2: 100%   KPS: 80. General: Alert, cooperative, pleasant, in no acute distress Head: Normal EENT: No conjunctival injection or scleral icterus.  Lungs:  Resp effort normal Cardiac: Regular rate Abdomen: Non-distended abdomen Skin: No rashes cyanosis or petechiae. Extremities: No clubbing or edema  Neurologic Exam: Mental Status: Awake, alert, attentive to examiner. Oriented to self and environment. Language is fluent with intact comprehension. Age advanced psychomotor slowing Cranial Nerves: Visual acuity is grossly normal. Visual fields are full. Extra-ocular movements intact. No ptosis. Face is symmetric Motor: Tone and bulk are normal. Power is full in both arms and legs. Reflexes are symmetric, no pathologic reflexes present.  Sensory: Intact to light touch Gait: Mild dystaxia   Labs: I have reviewed the data as listed    Component Value Date/Time   NA 138 11/01/2020 1305   NA 143 12/06/2019 0949   K 4.9 11/01/2020 1305   CL 101 11/01/2020 1305   CO2 26 11/01/2020 1305   GLUCOSE 362 (H) 11/01/2020 1305   BUN 29 (H) 11/01/2020 1305   BUN 16 12/06/2019 0949   CREATININE 1.16 11/01/2020 1305   CALCIUM 9.3 11/01/2020 1305   PROT 6.0 (L) 11/01/2020 1305   PROT 6.4  12/06/2019 0949   ALBUMIN 3.2 (L) 11/01/2020 1305   ALBUMIN 4.1 12/06/2019 0949   AST 9 (L) 11/01/2020 1305   ALT 44 11/01/2020 1305   ALKPHOS 66 11/01/2020 1305   BILITOT 0.5 11/01/2020 1305   GFRNONAA >60 11/01/2020 1305   GFRAA 101 12/06/2019 0949   Lab Results  Component Value Date   WBC 11.6 (H) 11/01/2020   NEUTROABS 10.7 (H) 11/01/2020   HGB 12.8 (L) 11/01/2020   HCT 36.6 (L) 11/01/2020   MCV 88.6 11/01/2020   PLT 229 11/01/2020    Imaging:  DG Chest 2 View  Result Date: 10/15/2020 CLINICAL DATA:  75 year old male with weakness. EXAM: CHEST - 2 VIEW COMPARISON:  Chest radiograph dated 10/11/2020. FINDINGS: Shallow inspiration. Focal area of rounded opacity with several associated fiducial markers as seen on the prior radiograph. No new consolidation. There is no pleural effusion or pneumothorax. Mild cardiomegaly. No acute osseous pathology. IMPRESSION: 1. No acute cardiopulmonary process. No interval change in the appearance of the rounded right upper lobe density. 2. Mild cardiomegaly. Electronically Signed   By: Anner Crete M.D.   On: 10/15/2020 20:41   DG Chest 2 View  Result Date: 10/11/2020 CLINICAL DATA:  Cough and dyspnea today. Non-small cell right lung cancer. EXAM: CHEST - 2 VIEW COMPARISON:  02/02/2019 chest radiograph.  01/25/2020 chest CT. FINDINGS: Stable cardiomediastinal silhouette with normal heart size. No pneumothorax. No pleural effusion. Three clustered fiducial markers in the upper right lung with surrounding patchy ill-defined opacity, which appears mildly increased from prior chest CT. Clear left lung. Nipple shadows overlie the lower lungs. IMPRESSION: Patchy ill-defined upper right lung opacity surrounding clustered fiducial markers, mildly increased from prior chest CT, nonspecific, differential including post treatment change, recurrent tumor or pneumonia. Chest CT suggested for further evaluation. Electronically Signed   By: Ilona Sorrel M.D.   On:  10/11/2020 14:14   CT Head Wo Contrast  Result Date: 10/15/2020 CLINICAL DATA:  Weakness EXAM: CT HEAD WITHOUT CONTRAST TECHNIQUE: Contiguous axial images were obtained from the base of the skull through the vertex without intravenous contrast. COMPARISON:  None. FINDINGS: Brain: Cystic mass is seen in the right parietal lobe measuring 3.3 cm with surrounding extensive vasogenic edema. Probable cystic mass in the left occipital lobe measuring 2 cm with surrounding edema. Possible additional lesion in the right occipital lobe and right cerebellar hemisphere. Findings suspicious for metastatic disease. No hemorrhage.  No midline shift or hydrocephalus. Vascular: No hyperdense vessel or unexpected calcification. Skull: No acute calvarial abnormality. Sinuses/Orbits: No acute findings Other: None IMPRESSION: Multiple apparent cystic lesions within the brain with surrounding vasogenic edema concerning for metastatic disease. Recommend further evaluation with MRI. These results were called by telephone at the time of interpretation on 10/15/2020 at 9:03 pm to provider [facility] , who verbally acknowledged these results. Electronically Signed   By: Rolm Baptise M.D.   On: 10/15/2020 21:05   MR Brain W and Wo Contrast  Result Date: 10/16/2020 CLINICAL DATA:  Left greater than right-sided weakness. Multiple brain lesions on CT. History of lung cancer. EXAM: MRI HEAD WITHOUT AND WITH CONTRAST TECHNIQUE: Multiplanar, multiecho pulse sequences of the brain and surrounding structures were obtained without and with intravenous contrast. CONTRAST:  7.40mL GADAVIST GADOBUTROL 1 MMOL/ML IV SOLN COMPARISON:  Head CT 10/15/2020 FINDINGS: Brain: Partially cystic lesions with irregular peripheral enhancement measured 2.2 x 1.7 cm in the posteromedial right cerebellar hemisphere (series 10, image 16), 2.6 x 2.1 cm in the left occipital lobe (series 10, image 27), 2.4 x 2.0 cm in the medial right parietal lobe (series 10,  image 32), and 3.3 x 3.1 cm in the posterior right frontal lobe (series 10, image 40). There is a small amount of chronic blood products associated with the lesions. There is extensive vasogenic edema associated with the posterior right frontal lesion and milder edema associated with the other cerebral and cerebellar lesions. There is no midline shift. Scattered small foci of T2 hyperintensity in the cerebral white matter and pons are nonspecific but compatible with mild chronic small vessel ischemic disease. No acute infarct or extra-axial fluid collection is evident. Cerebral volume is within normal limits for age. Vascular: Major intracranial vascular flow voids are preserved. Skull and upper cervical spine: Unremarkable bone marrow signal. Sinuses/Orbits: Mild depression of the right lamina papyracea which may reflect a remote orbital fracture. Paranasal sinuses and mastoid air cells are clear. Other: None. IMPRESSION: 1. Four brain lesions consistent with metastases. Extensive vasogenic edema associated with the posterior right frontal lesion without midline shift. 2. Mild chronic small vessel ischemic disease. Electronically Signed   By: Logan Bores M.D.   On: 10/16/2020 09:25   CT CHEST ABDOMEN PELVIS W CONTRAST  Result Date: 10/16/2020 CLINICAL DATA:  Brain metastases, staging, history of lung cancer EXAM: CT CHEST, ABDOMEN, AND PELVIS WITH CONTRAST TECHNIQUE: Multidetector CT imaging of the chest, abdomen and pelvis was performed following the standard protocol during bolus administration of intravenous contrast. CONTRAST:  12mL OMNIPAQUE IOHEXOL 300 MG/ML  SOLN COMPARISON:  02/13/2020 FINDINGS: CT CHEST FINDINGS Cardiovascular: Aortic atherosclerosis. Normal heart size. No pericardial effusion. Mediastinum/Nodes: No enlarged mediastinal, hilar, or axillary lymph nodes. Thyroid gland, trachea, and esophagus demonstrate no significant findings. Lungs/Pleura: Expected interval evolution of radiation  fibrosis in the perihilar and suprahilar right lung. A nodular component in the central right upper lobe is minimally decreased in size although more difficult to appreciate against a background of developing fibrosis, measuring approximately 1.4 x 1.3 cm, previously 1.5 x 1.5 cm (series 5, image 51). No pleural effusion or pneumothorax. Musculoskeletal: No chest wall mass or suspicious bone lesions identified. CT ABDOMEN PELVIS FINDINGS Hepatobiliary: No solid liver abnormality is seen. No gallstones, gallbladder wall thickening, or biliary dilatation. Pancreas: Unremarkable. No pancreatic ductal dilatation or surrounding inflammatory changes. Spleen: Normal in size without significant abnormality. Adrenals/Urinary Tract: Stable small right adrenal adenoma, measuring 1.3 x 0.9 cm (series 3,  image 61). Kidneys are normal, without renal calculi, solid lesion, or hydronephrosis. Bladder is unremarkable. Stomach/Bowel: Stomach is within normal limits. Appendix appears normal. No evidence of bowel wall thickening, distention, or inflammatory changes. Vascular/Lymphatic: Aortic atherosclerosis. No enlarged abdominal or pelvic lymph nodes. Reproductive: No mass or other abnormality. Other: Small, fat containing bilateral inguinal hernias. No abdominopelvic ascites. Musculoskeletal: No acute or significant osseous findings. IMPRESSION: 1. Expected interval evolution of radiation fibrosis in the perihilar and suprahilar right lung. A nodular component in the central right upper lobe is minimally decreased in size although more difficult to appreciate against a background of developing fibrosis. Other previously described nodules are not well appreciated. Findings are consistent with treatment response and expected post treatment appearance. 2. No evidence of lymphadenopathy or metastatic disease in the chest, abdomen or pelvis. Aortic Atherosclerosis (ICD10-I70.0). Electronically Signed   By: Eddie Candle M.D.   On:  10/16/2020 10:21      Assessment/Plan Brain metastases ([place]) [C79.31]  Benjamin Florina Ou. is clinically stable following whole brain radiation therapy.  His main neurologic deficits at this time is dystaxia.    He is weaning dexamethasone, has 10 days remaining on taper.  He will continue this as scheduled.    For gait issues, he will have physical therapy evaluation at home starting tomorrow.    Systemic therapy and treatment planning managed by Dr. Lorenso Courier.    We spent twenty additional minutes teaching regarding the natural history, biology, and historical experience in the treatment of neurologic complications of cancer.   We appreciate the opportunity to participate in the care of [place]..   We ask that [place]. return to clinic in 3 months following next brain MRI, or sooner as needed.  All questions were answered. The patient knows to call the clinic with any problems, questions or concerns. No barriers to learning were detected.  The total time spent in the encounter was 40 minutes and more than 50% was on counseling and review of test results   Benjamin Sellers, MD Medical Director of Neuro-Oncology [facility] at [place] 11/06/20 10:08 AM

## 2020-11-07 DIAGNOSIS — E1169 Type 2 diabetes mellitus with other specified complication: Secondary | ICD-10-CM | POA: Diagnosis not present

## 2020-11-07 DIAGNOSIS — H409 Unspecified glaucoma: Secondary | ICD-10-CM | POA: Diagnosis not present

## 2020-11-07 DIAGNOSIS — C3481 Malignant neoplasm of overlapping sites of right bronchus and lung: Secondary | ICD-10-CM | POA: Diagnosis not present

## 2020-11-07 DIAGNOSIS — E785 Hyperlipidemia, unspecified: Secondary | ICD-10-CM | POA: Diagnosis not present

## 2020-11-07 DIAGNOSIS — I10 Essential (primary) hypertension: Secondary | ICD-10-CM | POA: Diagnosis not present

## 2020-11-07 DIAGNOSIS — C7931 Secondary malignant neoplasm of brain: Secondary | ICD-10-CM | POA: Diagnosis not present

## 2020-11-08 ENCOUNTER — Other Ambulatory Visit: Payer: Self-pay

## 2020-11-08 ENCOUNTER — Ambulatory Visit (INDEPENDENT_AMBULATORY_CARE_PROVIDER_SITE_OTHER): Payer: Medicare Other | Admitting: Podiatrist

## 2020-11-08 DIAGNOSIS — E119 Type 2 diabetes mellitus without complications: Secondary | ICD-10-CM

## 2020-11-08 DIAGNOSIS — B351 Tinea unguium: Secondary | ICD-10-CM

## 2020-11-08 DIAGNOSIS — M79609 Pain in unspecified limb: Secondary | ICD-10-CM

## 2020-11-08 NOTE — Patient Instructions (Signed)

## 2020-11-09 ENCOUNTER — Telehealth: Payer: Self-pay | Admitting: Hematology and Oncology

## 2020-11-09 DIAGNOSIS — E1169 Type 2 diabetes mellitus with other specified complication: Secondary | ICD-10-CM | POA: Diagnosis not present

## 2020-11-09 DIAGNOSIS — C3481 Malignant neoplasm of overlapping sites of right bronchus and lung: Secondary | ICD-10-CM | POA: Diagnosis not present

## 2020-11-09 DIAGNOSIS — H409 Unspecified glaucoma: Secondary | ICD-10-CM | POA: Diagnosis not present

## 2020-11-09 DIAGNOSIS — I10 Essential (primary) hypertension: Secondary | ICD-10-CM | POA: Diagnosis not present

## 2020-11-09 DIAGNOSIS — C7931 Secondary malignant neoplasm of brain: Secondary | ICD-10-CM | POA: Diagnosis not present

## 2020-11-09 DIAGNOSIS — E785 Hyperlipidemia, unspecified: Secondary | ICD-10-CM | POA: Diagnosis not present

## 2020-11-09 NOTE — Telephone Encounter (Signed)
Scheduled appt per 7/7 sch msg. Pt's sister is aware.

## 2020-11-12 ENCOUNTER — Telehealth: Payer: Self-pay

## 2020-11-12 DIAGNOSIS — E1169 Type 2 diabetes mellitus with other specified complication: Secondary | ICD-10-CM | POA: Diagnosis not present

## 2020-11-12 DIAGNOSIS — I10 Essential (primary) hypertension: Secondary | ICD-10-CM | POA: Diagnosis not present

## 2020-11-12 DIAGNOSIS — E785 Hyperlipidemia, unspecified: Secondary | ICD-10-CM | POA: Diagnosis not present

## 2020-11-12 DIAGNOSIS — C7931 Secondary malignant neoplasm of brain: Secondary | ICD-10-CM | POA: Diagnosis not present

## 2020-11-12 DIAGNOSIS — H409 Unspecified glaucoma: Secondary | ICD-10-CM | POA: Diagnosis not present

## 2020-11-12 DIAGNOSIS — C3481 Malignant neoplasm of overlapping sites of right bronchus and lung: Secondary | ICD-10-CM | POA: Diagnosis not present

## 2020-11-12 NOTE — Telephone Encounter (Signed)
Opened in error

## 2020-11-12 NOTE — Telephone Encounter (Signed)
Spoke with patient's sister [name] and scheduled an in-person Palliative Consult for 11/28/20 @ 8:30 AM with Dr. Hollace Kinnier. Documentation will be noted in [place]. Marland Kitchen  COVID screening was negative. No pets in home. Patient lives with sister [name].  Consent obtained; updated Outlook/Netsmart/Team List and Epic.   Family is aware they may be receiving a call from MD the day before or day of to confirm appointment.

## 2020-11-14 ENCOUNTER — Encounter: Payer: Self-pay | Admitting: Podiatrist

## 2020-11-14 DIAGNOSIS — H409 Unspecified glaucoma: Secondary | ICD-10-CM | POA: Diagnosis not present

## 2020-11-14 DIAGNOSIS — C7931 Secondary malignant neoplasm of brain: Secondary | ICD-10-CM | POA: Diagnosis not present

## 2020-11-14 DIAGNOSIS — E785 Hyperlipidemia, unspecified: Secondary | ICD-10-CM | POA: Diagnosis not present

## 2020-11-14 DIAGNOSIS — I10 Essential (primary) hypertension: Secondary | ICD-10-CM | POA: Diagnosis not present

## 2020-11-14 DIAGNOSIS — E1169 Type 2 diabetes mellitus with other specified complication: Secondary | ICD-10-CM | POA: Diagnosis not present

## 2020-11-14 DIAGNOSIS — C3481 Malignant neoplasm of overlapping sites of right bronchus and lung: Secondary | ICD-10-CM | POA: Diagnosis not present

## 2020-11-14 NOTE — Progress Notes (Signed)
Subjective: Benjamin Willis. is a 75 y.o. male patient with history of diabetes who presents to office today complaining of long,mildly painful nails  while ambulating in shoes; unable to trim. Patient denies any new cramping, numbness, burning or tingling in the legs.  Patient Active Problem List   Diagnosis Date Noted   Brain metastases [facility])    Palliative care by specialist    Metastatic lung cancer (metastasis from lung to other site) ([place]) 10/16/2020   Diabetes mellitus type 2 in nonobese ([place]) 10/16/2020   Glaucoma 10/16/2020   Lung cancer metastatic to brain ([place]) 10/16/2020   Atherosclerosis of aorta ([place]) 05/31/2020   Primary cancer of right upper lobe of lung ([place]) 02/07/2019   Lung nodule 02/02/2019   Hypertension associated with type 2 diabetes mellitus ([place]) 05/24/2018   Dyslipidemia 05/24/2018   Dyslipidemia associated with type 2 diabetes mellitus ([place]) 06/09/2017   HemoglobinA1C on 09/03/20 was 7.4  Objective: General: Patient is awake, alert, and oriented x 3 and in no acute distress.  Integument: Skin is warm, dry and supple bilateral. Nails are tender, long, thickened and  dystrophic with subungual debris, consistent with onychomycosis, 1-5 bilateral. No signs of infection. No open lesions or preulcerative lesions present bilateral. Remaining integument unremarkable.  Vasculature:  Dorsalis Pedis pulse /4 bilateral. Posterior Tibial pulse  /4 bilateral.  Capillary fill time <3 sec 1-5 bilateral. Positive hair growth to the level of the digits. Temperature gradient within normal limits. No varicosities present bilateral. No edema present bilateral.   Neurology: The patient has intact sensation measured with a 5.07/10g Semmes Weinstein Monofilament at all pedal sites bilateral . Vibratory sensation diminished bilateral with tuning fork. No Babinski sign present bilateral.   Musculoskeletal: No symptomatic pedal deformities noted bilateral. Muscular strength 5/5 in all  lower extremity muscular groups bilateral without pain on range of motion . No tenderness with calf compression bilateral.  Assessment and Plan:  1. Encounter for diabetic foot exam ([place])   2. Pain due to onychomycosis of nail      -Examined patient. -Discussed and educated patient on diabetic foot care, especially with  regards to the vascular, neurological and musculoskeletal systems.  -Stressed the importance of good glycemic control and the detriment of not  controlling glucose levels in relation to the foot. -Mechanically debrided all nails 1-5 bilateral using sterile nail nipper and filed with dremel without incident  -Answered all patient questions -Patient to return  in 3 months for at risk foot care -Patient advised to call the office if any problems or questions arise in the meantime.

## 2020-11-16 ENCOUNTER — Telehealth: Payer: Self-pay | Admitting: Emergency Medicine

## 2020-11-16 DIAGNOSIS — I10 Essential (primary) hypertension: Secondary | ICD-10-CM | POA: Diagnosis not present

## 2020-11-16 DIAGNOSIS — H409 Unspecified glaucoma: Secondary | ICD-10-CM | POA: Diagnosis not present

## 2020-11-16 DIAGNOSIS — C3481 Malignant neoplasm of overlapping sites of right bronchus and lung: Secondary | ICD-10-CM | POA: Diagnosis not present

## 2020-11-16 DIAGNOSIS — C7931 Secondary malignant neoplasm of brain: Secondary | ICD-10-CM | POA: Diagnosis not present

## 2020-11-16 DIAGNOSIS — E1169 Type 2 diabetes mellitus with other specified complication: Secondary | ICD-10-CM | POA: Diagnosis not present

## 2020-11-16 DIAGNOSIS — E785 Hyperlipidemia, unspecified: Secondary | ICD-10-CM | POA: Diagnosis not present

## 2020-11-16 NOTE — Telephone Encounter (Signed)
   Request for refill levETIRAcetam (KEPPRA) 1000 MG tablet  pantoprazole (PROTONIX) 40 MG tablet  gabapentin (NEURONTIN) 300 MG capsule  Pharmacy [place] ([facility] [place] [facility] [place]

## 2020-11-19 DIAGNOSIS — H409 Unspecified glaucoma: Secondary | ICD-10-CM | POA: Diagnosis not present

## 2020-11-19 DIAGNOSIS — C7931 Secondary malignant neoplasm of brain: Secondary | ICD-10-CM | POA: Diagnosis not present

## 2020-11-19 DIAGNOSIS — I10 Essential (primary) hypertension: Secondary | ICD-10-CM | POA: Diagnosis not present

## 2020-11-19 DIAGNOSIS — E1169 Type 2 diabetes mellitus with other specified complication: Secondary | ICD-10-CM | POA: Diagnosis not present

## 2020-11-19 DIAGNOSIS — E785 Hyperlipidemia, unspecified: Secondary | ICD-10-CM | POA: Diagnosis not present

## 2020-11-19 DIAGNOSIS — C3481 Malignant neoplasm of overlapping sites of right bronchus and lung: Secondary | ICD-10-CM | POA: Diagnosis not present

## 2020-11-19 MED ORDER — GABAPENTIN 300 MG PO CAPS: 300.0000 mg | ORAL_CAPSULE | Freq: Every day | ORAL | 0 refills | Status: DC

## 2020-11-19 MED ORDER — LEVETIRACETAM 1000 MG PO TABS: 1000.0000 mg | ORAL_TABLET | Freq: Two times a day (BID) | ORAL | 0 refills | Status: DC

## 2020-11-19 MED ORDER — PANTOPRAZOLE SODIUM 40 MG PO TBEC: 40.0000 mg | DELAYED_RELEASE_TABLET | Freq: Every day | ORAL | 0 refills | Status: DC

## 2020-11-19 NOTE — Telephone Encounter (Signed)
Refilled medication

## 2020-11-20 ENCOUNTER — Encounter: Payer: Self-pay | Admitting: Hematology and Oncology

## 2020-11-22 ENCOUNTER — Other Ambulatory Visit: Payer: Self-pay | Admitting: Emergency Medicine

## 2020-11-22 DIAGNOSIS — C3481 Malignant neoplasm of overlapping sites of right bronchus and lung: Secondary | ICD-10-CM | POA: Diagnosis not present

## 2020-11-22 DIAGNOSIS — C7931 Secondary malignant neoplasm of brain: Secondary | ICD-10-CM | POA: Diagnosis not present

## 2020-11-22 DIAGNOSIS — E1159 Type 2 diabetes mellitus with other circulatory complications: Secondary | ICD-10-CM

## 2020-11-22 DIAGNOSIS — H409 Unspecified glaucoma: Secondary | ICD-10-CM | POA: Diagnosis not present

## 2020-11-22 DIAGNOSIS — E785 Hyperlipidemia, unspecified: Secondary | ICD-10-CM | POA: Diagnosis not present

## 2020-11-22 DIAGNOSIS — I10 Essential (primary) hypertension: Secondary | ICD-10-CM | POA: Diagnosis not present

## 2020-11-22 DIAGNOSIS — E1169 Type 2 diabetes mellitus with other specified complication: Secondary | ICD-10-CM | POA: Diagnosis not present

## 2020-11-23 DIAGNOSIS — I10 Essential (primary) hypertension: Secondary | ICD-10-CM | POA: Diagnosis not present

## 2020-11-23 DIAGNOSIS — H409 Unspecified glaucoma: Secondary | ICD-10-CM | POA: Diagnosis not present

## 2020-11-23 DIAGNOSIS — E785 Hyperlipidemia, unspecified: Secondary | ICD-10-CM | POA: Diagnosis not present

## 2020-11-23 DIAGNOSIS — C7931 Secondary malignant neoplasm of brain: Secondary | ICD-10-CM | POA: Diagnosis not present

## 2020-11-23 DIAGNOSIS — E1169 Type 2 diabetes mellitus with other specified complication: Secondary | ICD-10-CM | POA: Diagnosis not present

## 2020-11-23 DIAGNOSIS — C3481 Malignant neoplasm of overlapping sites of right bronchus and lung: Secondary | ICD-10-CM | POA: Diagnosis not present

## 2020-11-23 LAB — GUARDANT 360

## 2020-11-24 DIAGNOSIS — Z7984 Long term (current) use of oral hypoglycemic drugs: Secondary | ICD-10-CM | POA: Diagnosis not present

## 2020-11-24 DIAGNOSIS — E785 Hyperlipidemia, unspecified: Secondary | ICD-10-CM | POA: Diagnosis not present

## 2020-11-24 DIAGNOSIS — Z87891 Personal history of nicotine dependence: Secondary | ICD-10-CM | POA: Diagnosis not present

## 2020-11-24 DIAGNOSIS — E1169 Type 2 diabetes mellitus with other specified complication: Secondary | ICD-10-CM | POA: Diagnosis not present

## 2020-11-24 DIAGNOSIS — Z9181 History of falling: Secondary | ICD-10-CM | POA: Diagnosis not present

## 2020-11-24 DIAGNOSIS — I10 Essential (primary) hypertension: Secondary | ICD-10-CM | POA: Diagnosis not present

## 2020-11-24 DIAGNOSIS — C3481 Malignant neoplasm of overlapping sites of right bronchus and lung: Secondary | ICD-10-CM | POA: Diagnosis not present

## 2020-11-24 DIAGNOSIS — H409 Unspecified glaucoma: Secondary | ICD-10-CM | POA: Diagnosis not present

## 2020-11-24 DIAGNOSIS — C7931 Secondary malignant neoplasm of brain: Secondary | ICD-10-CM | POA: Diagnosis not present

## 2020-11-26 ENCOUNTER — Encounter: Payer: Self-pay | Admitting: Radiation Oncology

## 2020-11-26 DIAGNOSIS — C3481 Malignant neoplasm of overlapping sites of right bronchus and lung: Secondary | ICD-10-CM | POA: Diagnosis not present

## 2020-11-26 DIAGNOSIS — E1169 Type 2 diabetes mellitus with other specified complication: Secondary | ICD-10-CM | POA: Diagnosis not present

## 2020-11-26 DIAGNOSIS — H409 Unspecified glaucoma: Secondary | ICD-10-CM | POA: Diagnosis not present

## 2020-11-26 DIAGNOSIS — E785 Hyperlipidemia, unspecified: Secondary | ICD-10-CM | POA: Diagnosis not present

## 2020-11-26 DIAGNOSIS — I10 Essential (primary) hypertension: Secondary | ICD-10-CM | POA: Diagnosis not present

## 2020-11-26 DIAGNOSIS — C7931 Secondary malignant neoplasm of brain: Secondary | ICD-10-CM | POA: Diagnosis not present

## 2020-11-27 ENCOUNTER — Encounter: Payer: Self-pay | Admitting: Radiology

## 2020-11-27 DIAGNOSIS — C3481 Malignant neoplasm of overlapping sites of right bronchus and lung: Secondary | ICD-10-CM | POA: Diagnosis not present

## 2020-11-27 DIAGNOSIS — C7931 Secondary malignant neoplasm of brain: Secondary | ICD-10-CM | POA: Diagnosis not present

## 2020-11-27 DIAGNOSIS — I10 Essential (primary) hypertension: Secondary | ICD-10-CM | POA: Diagnosis not present

## 2020-11-27 DIAGNOSIS — H409 Unspecified glaucoma: Secondary | ICD-10-CM | POA: Diagnosis not present

## 2020-11-27 DIAGNOSIS — E1169 Type 2 diabetes mellitus with other specified complication: Secondary | ICD-10-CM | POA: Diagnosis not present

## 2020-11-27 DIAGNOSIS — E785 Hyperlipidemia, unspecified: Secondary | ICD-10-CM | POA: Diagnosis not present

## 2020-11-28 DIAGNOSIS — G4089 Other seizures: Secondary | ICD-10-CM | POA: Diagnosis not present

## 2020-11-28 DIAGNOSIS — E785 Hyperlipidemia, unspecified: Secondary | ICD-10-CM | POA: Diagnosis not present

## 2020-11-28 DIAGNOSIS — C3481 Malignant neoplasm of overlapping sites of right bronchus and lung: Secondary | ICD-10-CM | POA: Diagnosis not present

## 2020-11-28 DIAGNOSIS — E1169 Type 2 diabetes mellitus with other specified complication: Secondary | ICD-10-CM | POA: Diagnosis not present

## 2020-11-28 DIAGNOSIS — H409 Unspecified glaucoma: Secondary | ICD-10-CM | POA: Diagnosis not present

## 2020-11-28 DIAGNOSIS — C7931 Secondary malignant neoplasm of brain: Secondary | ICD-10-CM | POA: Diagnosis not present

## 2020-11-28 DIAGNOSIS — I10 Essential (primary) hypertension: Secondary | ICD-10-CM | POA: Diagnosis not present

## 2020-11-28 DIAGNOSIS — C3490 Malignant neoplasm of unspecified part of unspecified bronchus or lung: Secondary | ICD-10-CM | POA: Diagnosis not present

## 2020-11-28 DIAGNOSIS — Z515 Encounter for palliative care: Secondary | ICD-10-CM | POA: Diagnosis not present

## 2020-11-30 ENCOUNTER — Encounter: Payer: Self-pay | Admitting: Hematology and Oncology

## 2020-11-30 ENCOUNTER — Other Ambulatory Visit: Payer: Self-pay | Admitting: Hematology and Oncology

## 2020-11-30 ENCOUNTER — Encounter: Payer: Self-pay | Admitting: *Deleted

## 2020-11-30 ENCOUNTER — Inpatient Hospital Stay (HOSPITAL_BASED_OUTPATIENT_CLINIC_OR_DEPARTMENT_OTHER): Payer: Medicare Other | Admitting: Hematology and Oncology

## 2020-11-30 ENCOUNTER — Other Ambulatory Visit: Payer: Self-pay

## 2020-11-30 ENCOUNTER — Inpatient Hospital Stay: Payer: Medicare Other

## 2020-11-30 ENCOUNTER — Telehealth: Payer: Self-pay

## 2020-11-30 VITALS — BP 145/78 | HR 85 | Temp 97.8°F | Resp 18 | Wt 157.9 lb

## 2020-11-30 DIAGNOSIS — Z79899 Other long term (current) drug therapy: Secondary | ICD-10-CM | POA: Diagnosis not present

## 2020-11-30 DIAGNOSIS — C3411 Malignant neoplasm of upper lobe, right bronchus or lung: Secondary | ICD-10-CM | POA: Diagnosis not present

## 2020-11-30 DIAGNOSIS — E032 Hypothyroidism due to medicaments and other exogenous substances: Secondary | ICD-10-CM

## 2020-11-30 DIAGNOSIS — C7931 Secondary malignant neoplasm of brain: Secondary | ICD-10-CM

## 2020-11-30 DIAGNOSIS — Z923 Personal history of irradiation: Secondary | ICD-10-CM | POA: Diagnosis not present

## 2020-11-30 DIAGNOSIS — C3491 Malignant neoplasm of unspecified part of right bronchus or lung: Secondary | ICD-10-CM | POA: Diagnosis not present

## 2020-11-30 DIAGNOSIS — C3490 Malignant neoplasm of unspecified part of unspecified bronchus or lung: Secondary | ICD-10-CM | POA: Diagnosis not present

## 2020-11-30 LAB — CBC WITH DIFFERENTIAL (CANCER CENTER ONLY)
Abs Immature Granulocytes: 0.03 10*3/uL (ref 0.00–0.07)
Basophils Absolute: 0 10*3/uL (ref 0.0–0.1)
Basophils Relative: 1 %
Eosinophils Absolute: 0 10*3/uL (ref 0.0–0.5)
Eosinophils Relative: 0 %
HCT: 35.8 % — ABNORMAL LOW (ref 39.0–52.0)
Hemoglobin: 11.8 g/dL — ABNORMAL LOW (ref 13.0–17.0)
Immature Granulocytes: 1 %
Lymphocytes Relative: 26 %
Lymphs Abs: 1 10*3/uL (ref 0.7–4.0)
MCH: 30.7 pg (ref 26.0–34.0)
MCHC: 33 g/dL (ref 30.0–36.0)
MCV: 93.2 fL (ref 80.0–100.0)
Monocytes Absolute: 0.5 10*3/uL (ref 0.1–1.0)
Monocytes Relative: 14 %
Neutro Abs: 2.1 10*3/uL (ref 1.7–7.7)
Neutrophils Relative %: 58 %
Platelet Count: 209 10*3/uL (ref 150–400)
RBC: 3.84 MIL/uL — ABNORMAL LOW (ref 4.22–5.81)
RDW: 12.5 % (ref 11.5–15.5)
WBC Count: 3.6 10*3/uL — ABNORMAL LOW (ref 4.0–10.5)
nRBC: 0 % (ref 0.0–0.2)

## 2020-11-30 LAB — CMP (CANCER CENTER ONLY)
ALT: 15 U/L (ref 0–44)
AST: 12 U/L — ABNORMAL LOW (ref 15–41)
Albumin: 3.4 g/dL — ABNORMAL LOW (ref 3.5–5.0)
Alkaline Phosphatase: 72 U/L (ref 38–126)
Anion gap: 8 (ref 5–15)
BUN: 13 mg/dL (ref 8–23)
CO2: 27 mmol/L (ref 22–32)
Calcium: 9.1 mg/dL (ref 8.9–10.3)
Chloride: 109 mmol/L (ref 98–111)
Creatinine: 0.8 mg/dL (ref 0.61–1.24)
GFR, Estimated: >60 mL/min (ref >60)
Glucose, Bld: 154 mg/dL — ABNORMAL HIGH (ref 70–99)
Potassium: 4 mmol/L (ref 3.5–5.1)
Sodium: 144 mmol/L (ref 135–145)
Total Bilirubin: 0.3 mg/dL (ref 0.3–1.2)
Total Protein: 6.2 g/dL — ABNORMAL LOW (ref 6.5–8.1)

## 2020-11-30 LAB — TSH: TSH: 1.06 u[IU]/mL (ref 0.320–4.118)

## 2020-11-30 MED ORDER — LIDOCAINE-PRILOCAINE 2.5-2.5 % EX CREA: 1.0000 "application " | TOPICAL_CREAM | CUTANEOUS | 0 refills | Status: DC | PRN

## 2020-11-30 MED ORDER — FOLIC ACID 1 MG PO TABS: 1.0000 mg | ORAL_TABLET | Freq: Every day | ORAL | 1 refills | Status: DC

## 2020-11-30 MED ORDER — PROCHLORPERAZINE MALEATE 10 MG PO TABS: 10.0000 mg | ORAL_TABLET | Freq: Four times a day (QID) | ORAL | 0 refills | Status: DC | PRN

## 2020-11-30 MED ORDER — ONDANSETRON HCL 8 MG PO TABS: 8.0000 mg | ORAL_TABLET | Freq: Three times a day (TID) | ORAL | 0 refills | Status: DC | PRN

## 2020-11-30 NOTE — Telephone Encounter (Signed)
Notified Patient of Prior Authorization approval for Lidocaine-Prilocaine 2.5% Cream. Authorization is effective from 11/30/20 through 02/27/2021

## 2020-11-30 NOTE — Progress Notes (Signed)
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  This was followed by an MRI of the brain with and without contrast which showed 4 brain lesions consistent with metastases, extensive vasogenic edema associated with the posterior right frontal lesion without midline shift. 6/15-6/28/2022: 10 fractions of WBRT with Rad/Onc 11/23/2020: Guardant 360 returns with no targetable mutation.   Interval History:  [name]. 75 y.o. 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 The patient's last visit was on 11/01/2020.  In the interim since the last visit he has completed radiation therapy.   On exam today Mrs. Moring is accompanied by his sister.  He notes that he has been well in the interim since her last visit.  He has not been having any issues with fevers, chills, sweats, nausea, vomiting or diarrhea.  He notes that he is not having any headache or vision changes.  Otherwise his weight has been steady and he has no questions concerns or complaints. A full 10 point ROS is listed below.  The bulk of our discussion focused on the results of the guardant 360 and what to expect with systemic chemotherapy.  He and his sister were in agreement they would like to proceed with chemotherapy  treatment.  MEDICAL HISTORY:  Past Medical History:  Diagnosis Date   Allergy    Diabetes mellitus without complication ([place])    Type II   Glaucoma 10/16/2020   History of radiation therapy    Thorax- SBRT 02/21/19-02/28/19, Rt Lung- IMRT 02/29/20-04/15/20 Dr. Gery Pray   History of radiation therapy 10/30/2020   whole brain 10/17/2020-10/30/2020 Dr Gery Pray   Hyperlipidemia    Hypertension    Lung cancer ([facility])    Stage IA2 (cT1b, N0) non-small cell lung cancer (adenocarcinoma)    SURGICAL HISTORY: Past Surgical History:  Procedure Laterality Date   ELECTROMAGNETIC NAVIGATION BROCHOSCOPY Right 02/02/2019   Procedure: VIDEO BRONCHOSCOPY WITH NAVIGATION AND FIDUCIAL PLACEMENT;  Surgeon: Garner Nash, DO;  Location: [facility];  Service: Cardiopulmonary;  Laterality: Right;   FUDUCIAL PLACEMENT Right 02/02/2019   Procedure: Placement Of Fuducial Right Upper Lobe;  Surgeon: Garner Nash, DO;  Location: [facility];  Service: Cardiopulmonary;  Laterality: Right;   none     VIDEO BRONCHOSCOPY WITH RADIAL ENDOBRONCHIAL ULTRASOUND Right 02/02/2019   Procedure: Video Bronchoscopy With Radial Endobronchial Ultrasound;  Surgeon: Garner Nash, DO;  Location: [facility];  Service: Cardiopulmonary;  Laterality: Right;    SOCIAL HISTORY: Social History   Socioeconomic History   Marital status: Single    Spouse name: Not on file   Number of children: 0   Years of education: Not on file   Highest education level: 9th grade  Occupational History   Not on file  Tobacco Use   Smoking status: Former    Packs/day: 1.00    Years: 48.00    Pack years: 48.00  Types: Cigarettes    Quit date: 02/20/2015    Years since quitting: 5.7   Smokeless tobacco: Former    Quit date: 10/2013  Vaping Use   Vaping Use: Never used  Substance and Sexual Activity   Alcohol use: No   Drug use: No   Sexual activity: Yes    Birth control/protection: Condom  Other Topics Concern   Not on file  Social  History Narrative   Not on file   Social Determinants of Health   Financial Resource Strain: Not on file  Food Insecurity: Not on file  Transportation Needs: Not on file  Physical Activity: Not on file  Stress: Not on file  Social Connections: Not on file  Intimate Partner Violence: Not on file    FAMILY HISTORY: Family History  Problem Relation Age of Onset   Lung cancer Neg Hx     ALLERGIES:  has no allergies on file.  MEDICATIONS:  Current Outpatient Medications  Medication Sig Dispense Refill   albuterol (VENTOLIN HFA) 108 (90 Base) MCG/ACT inhaler Inhale 2 puffs into the lungs every 6 (six) hours as needed for wheezing or shortness of breath. 18 g 6   amLODipine (NORVASC) 10 MG tablet Take 1 tablet (10 mg total) by mouth daily. 90 tablet 3   blood glucose meter kit and supplies KIT Dispense based on patient and insurance preference. Use up to four times daily as directed. 1 each 0   Blood Pressure Monitoring (BLOOD PRESSURE MONITOR/ARM) DEVI Check BP once a day. DX I 10 1 each 0   cetirizine (ZYRTEC) 10 MG tablet Take 1 tablet (10 mg total) by mouth daily. 30 tablet 11   gabapentin (NEURONTIN) 300 MG capsule Take 1 capsule (300 mg total) by mouth at bedtime. 30 capsule 0   glipiZIDE (GLUCOTROL) 5 MG tablet TAKE 1 TABLET BY MOUTH TWICE DAILY WITH A MEAL 180 tablet 0   latanoprost (XALATAN) 0.005 % ophthalmic solution Place 1 drop into both eyes at bedtime.     levETIRAcetam (KEPPRA) 1000 MG tablet Take 1 tablet (1,000 mg total) by mouth 2 (two) times daily. 60 tablet 0   lisinopril (ZESTRIL) 40 MG tablet Take 1 tablet (40 mg total) by mouth daily. 90 tablet 3   metFORMIN (GLUCOPHAGE) 500 MG tablet TAKE 1 TABLET BY MOUTH TWICE DAILY WITH MEALS 180 tablet 0   Multiple Vitamins-Minerals (MULTIVITAMIN MEN) TABS Take 1 tablet by mouth daily.     pantoprazole (PROTONIX) 40 MG tablet Take 1 tablet (40 mg total) by mouth daily. (Patient not taking: Reported on 11/30/2020) 30 tablet 0    sitaGLIPtin (JANUVIA) 100 MG tablet Take 1 tablet (100 mg total) by mouth daily. 90 tablet 3   No current facility-administered medications for this visit.    REVIEW OF SYSTEMS:   Constitutional: ( - ) fevers, ( - )  chills , ( - ) night sweats Eyes: ( - ) blurriness of vision, ( - ) double vision, ( - ) watery eyes Ears, nose, mouth, throat, and face: ( - ) mucositis, ( - ) sore throat Respiratory: ( - ) cough, ( - ) dyspnea, ( - ) wheezes Cardiovascular: ( - ) palpitation, ( - ) chest discomfort, ( - ) lower extremity swelling Gastrointestinal:  ( - ) nausea, ( - ) heartburn, ( - ) change in bowel habits Skin: ( - ) abnormal skin rashes Lymphatics: ( - ) new lymphadenopathy, ( - ) easy bruising Neurological: ( - ) numbness, ( - )  tingling, ( - ) new weaknesses Behavioral/Psych: ( - ) mood change, ( - ) new changes  All other systems were reviewed with the patient and are negative.  PHYSICAL EXAMINATION:  Vitals:   11/30/20 1100  BP: (!) 145/78  Pulse: 85  Resp: 18  Temp: 97.8 F (36.6 C)  SpO2: 100%   Filed Weights   11/30/20 1100  Weight: 157 lb 14.4 oz (71.6 kg)    GENERAL: Well-appearing elderly African-American male, alert, no distress and comfortable SKIN: skin color, texture, turgor are normal, no rashes or significant lesions EYES: conjunctiva are pink and non-injected, sclera clear LUNGS: clear to auscultation and percussion with normal breathing effort HEART: regular rate & rhythm and no murmurs and no lower extremity edema PSYCH: alert & oriented x 3, fluent speech NEURO: no focal motor/sensory deficits  LABORATORY DATA:  I have reviewed the data as listed CBC Latest Ref Rng & Units 11/30/2020 11/01/2020 10/30/2020  WBC 4.0 - 10.5 K/uL 3.6(L) 11.6(H) 11.5(H)  Hemoglobin 13.0 - 17.0 g/dL 11.8(L) 12.8(L) 13.3  Hematocrit 39.0 - 52.0 % 35.8(L) 36.6(L) 39.4  Platelets 150 - 400 K/uL 209 229 231.0    CMP Latest Ref Rng & Units 11/30/2020 11/01/2020 10/30/2020   Glucose 70 - 99 mg/dL 967(Z) 361(A) 922(U)  BUN 8 - 23 mg/dL 13 77(X) 41(C)  Creatinine 0.61 - 1.24 mg/dL 9.27 0.05 1.35  Sodium 135 - 145 mmol/L 144 138 135  Potassium 3.5 - 5.1 mmol/L 4.0 4.9 4.7  Chloride 98 - 111 mmol/L 109 101 99  CO2 22 - 32 mmol/L 27 26 29   Calcium 8.9 - 10.3 mg/dL 9.1 9.3 9.2  Total Protein 6.5 - 8.1 g/dL 6.2(L) 6.0(L) 6.1  Total Bilirubin 0.3 - 1.2 mg/dL 0.3 0.5 0.5  Alkaline Phos 38 - 126 U/L 72 66 59  AST 15 - 41 U/L 12(L) 9(L) 11  ALT 0 - 44 U/L 15 44 50    RADIOGRAPHIC STUDIES:  No results found.  ASSESSMENT & PLAN Norm Amine Adelson. 75 y.o. 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  After review the labs, reviewed records, discussion with the patient the findings are most consistent with metastatic adenocarcinoma with spread to the brain.  Unfortunately we do not have new pathology as the lesions in his brain were not resectable and he underwent radiation therapy instead.  We can presume based off his prior pathology that this represents the original adenocarcinoma.  As such I do believe it is reasonable to proceed with carboplatin, pembrolizumab, pemetrexed therapy in order to help treat any possible micrometastatic disease.  The patient also underwent whole brain radiation therapy instead of SRS.  Therefore I do believe systemic therapy will be reasonable.  This plan of care was discussed with the resident lung expert Dr. 65. Additionally our Guardant 360 testing was negative for a targetable mutation.   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 Lost to follow up --presents with brain metastasis only. No evidence of disease in the C/A/P noted on CT scan from 10/16/2020.  --Patient completed whole brain radiation, but did not wish to undergo  SRS.  Radiation oncology noted that this may be an option in the future if he were to have recurrence in the brain --Guardant 360 on 11/01/2020 showed no targetable mutations.  Plan: --  plan to proceed with Carbo/Pem/Pem chemotherapy --continue to follow with neuro-oncology for brain metastasis  #Supportive Care -- chemotherapy education to be scheduled  -- port placement to be scheduled.  --daily PO 1mg  folic acid with pemetrexed. -- zofran 8mg  q8H PRN and compazine 10mg  PO q6H for nausea -- EMLA cream for port -- no pain medication required at this time.   No orders of the defined types were placed in this encounter.  All questions were answered. The patient knows to call the clinic with any problems, questions or concerns.  A total of more than 30 minutes were spent on this encounter with face-to-face time and non-face-to-face time, including preparing to see the patient, ordering tests and/or medications, counseling the patient and coordination of care as outlined above.   Ledell Peoples, MD Department of Hematology/Oncology [place] Phone: [phone] Pager: [phone] Email: [email or web address]  11/30/2020 12:00 PM

## 2020-11-30 NOTE — Progress Notes (Incomplete)
  Radiation Oncology         [phone] ________________________________  Patient Name: Benjamin Willis MRN: [id number] DOB: 1945/12/31 Referring Physician: Narda Rutherford Date of Service: 10/30/2020 [facility], [place]  End Of Treatment Note  Diagnoses: C34.11-Malignant neoplasm of upper lobe, right bronchus or lung C79.31-Secondary malignant neoplasm of brain  Cancer Staging: Stage IA2  (cT1b, N0) non-small cell lung cancer (adenocarcinoma) in the right upper lobe of the lung with brain metastasis  Intent: Palliative  Radiation Treatment Dates: 10/17/2020 through 10/30/2020 Site Technique Total Dose (Gy) Dose per Fx (Gy) Completed Fx Beam Energies  Brain: Brain Complex 30/30 3 10/10 6X   Narrative: The patient tolerated radiation therapy relatively well and has completed his whole brain radiation therapy. He denies pain, headaches or dizziness and reports having a good energy level. His mental clarity is good at this time, he is very pleasant to talk to.   The patient has been taking decadron, 4 mg, x3 daily. He is to be started on a decadron taper tomorrow for a couple of weeks in duration. Plan is to order a brain MRI in 1 to 2 months to determine if he may be a candidate for SRS if any residual tumor is noted  Plan: The patient will follow-up with radiation oncology in one month .  ________________________________________________ -----------------------------------  Blair Promise, PhD, MD  This document serves as a record of services personally performed by Gery Pray, MD. It was created on his behalf by Roney Mans, a trained medical scribe. The creation of this record is based on the scribe's personal observations and the provider's statements to them. This document has been checked and approved by the attending provider.

## 2020-11-30 NOTE — Progress Notes (Signed)
START ON PATHWAY REGIMEN - Non-Small Cell Lung     A cycle is every 21 days:     Pembrolizumab      Pemetrexed      Carboplatin   **Always confirm dose/schedule in your pharmacy ordering system**  Patient Characteristics: Stage IV Metastatic, Nonsquamous, Molecular Analysis Completed, Molecular Alteration Present and Targeted Therapy Exhausted OR EGFR Exon 20+ or KRAS G12C+ Present and No Prior Chemo/Immunotherapy OR No Alteration Present, Initial  Chemotherapy/Immunotherapy, PS = 0, 1, No Alteration Present, No Alteration Present, Candidate for Immunotherapy, PD-L1 Expression Positive 1-49% (TPS) / Negative / Not Tested / Awaiting Test Results and Immunotherapy Candidate Therapeutic Status: Stage IV Metastatic Histology: Nonsquamous Cell Broad Molecular Profiling Status: Molecular Analysis Completed Molecular Analysis Results: No Alteration Present ECOG Performance Status: 1 Chemotherapy/Immunotherapy Line of Therapy: Initial Chemotherapy/Immunotherapy EGFR Exons 18-21 Mutation Testing Status: Completed and Negative ALK Fusion/Rearrangement Testing Status: Completed and Negative BRAF V600 Mutation Testing Status: Completed and Negative KRAS G12C Mutation Testing Status: Completed and Negative MET Exon 14 Mutation Testing Status: Completed and Negative RET Fusion/Rearrangement Testing Status: Completed and Negative NTRK Fusion/Rearrangement Testing Status: Completed and Negative ROS1 Fusion/Rearrangement Testing Status: Completed and Negative Immunotherapy Candidate Status: Candidate for Immunotherapy PD-L1 Expression Status: PD-L1 Positive 1-49% (TPS) Intent of Therapy: Non-Curative / Palliative Intent, Discussed with Patient

## 2020-12-01 NOTE — Progress Notes (Signed)
Radiation Oncology         [phone] ________________________________  Name: Benjamin Willis. MRN: [id number]  Date: 12/03/2020  DOB: 04-Sep-1945  Follow-Up Visit Note  CC: Horald Pollen, MD  Orson Slick, MD    ICD-10-CM   1. Primary cancer of right upper lobe of lung (H[facility])  C34.11     2. Lung cancer metastatic to brain ([place])  C34.90    C79.31     3. Primary malignant neoplasm of lung metastatic to other site, unspecified laterality (H[facility])  C34.90 Sonafine emulsion 1 application    4. Brain metastases ([place])  C79.31       Diagnosis: Stage IA2  (cT1b, N0) non-small cell lung cancer (adenocarcinoma) presenting in the right upper lobe of the lung with brain metastasis  Interval Since Last Radiation: 1 month and 4 days  Intent: Palliative Radiation Treatment Dates: 10/17/2020 through 10/30/2020 Site Technique Total Dose (Gy) Dose per Fx (Gy) Completed Fx Beam Energies  Brain: Brain Complex 30/30 3 10/10 6X    Narrative:  The patient returns today for routine follow-up. The patient tolerated his radiation treatment very well, and has had no significant interval history since the end of his treatment.   The patient most recently followed up with Dr. Lorenso Courier on 11/30/20. During this visit, the patient was noted to be doing well. He denied having any  fevers, chills, sweats, nausea, vomiting, diarrhea, headache or vision changes. Per Dr. Libby Maw visit note: guardant 360 results on 11/23/20 showed no targetable mutation.  The patient will be starting systemic therapy in the next several days.  Patient denies any headaches or visual problems.  He is ambulating now with the assistance of a walker.  He does live with his family but according to his sister he is able to take a shower on his own.  Does complain of some itching along the scalp area since his whole brain radiation treatments.  PRIOR RADIATION THERAPY:    Cancer Staging: Clinical Stage IA2  (cT1b, N0)  non-small cell lung cancer (adenocarcinoma)   Intent: Curative  Radiation Treatment Dates: 02/21/2019 through 02/28/2019 Site Technique Total Dose (Gy) Dose per Fx (Gy) Completed Fx Beam Energies  Thorax: Lung_Rt SBRT 54/54 18 3/3 6XFFF    Patient later recurred in the right lung and underwent conventional fractionated radiation therapy over 6 and half weeks.   Radiation Treatment Dates: 02/29/2020 through 04/16/2020 Site Technique Total Dose (Gy) Dose per Fx (Gy) Completed Fx Beam Energies  Lung, Right: Lung_Rt IMRT 64/64 2 32/32 6X     Allergies:  has No Known Allergies.  Meds: Current Outpatient Medications  Medication Sig Dispense Refill   albuterol (VENTOLIN HFA) 108 (90 Base) MCG/ACT inhaler Inhale 2 puffs into the lungs every 6 (six) hours as needed for wheezing or shortness of breath. 18 g 6   amLODipine (NORVASC) 10 MG tablet Take 1 tablet (10 mg total) by mouth daily. 90 tablet 3   blood glucose meter kit and supplies KIT Dispense based on patient and insurance preference. Use up to four times daily as directed. 1 each 0   Blood Pressure Monitoring (BLOOD PRESSURE MONITOR/ARM) DEVI Check BP once a day. DX I 10 1 each 0   glipiZIDE (GLUCOTROL) 5 MG tablet TAKE 1 TABLET BY MOUTH TWICE DAILY WITH A MEAL 180 tablet 0   latanoprost (XALATAN) 0.005 % ophthalmic solution Place 1 drop into both eyes at bedtime.     levETIRAcetam (KEPPRA) 1000 MG tablet  Take 1 tablet (1,000 mg total) by mouth 2 (two) times daily. 60 tablet 0   metFORMIN (GLUCOPHAGE) 500 MG tablet TAKE 1 TABLET BY MOUTH TWICE DAILY WITH MEALS 180 tablet 0   Multiple Vitamins-Minerals (MULTIVITAMIN MEN) TABS Take 1 tablet by mouth daily.     cetirizine (ZYRTEC) 10 MG tablet Take 1 tablet (10 mg total) by mouth daily. (Patient not taking: Reported on 12/03/2020) 30 tablet 11   folic acid (FOLVITE) 1 MG tablet Take 1 tablet (1 mg total) by mouth daily. (Patient not taking: Reported on 12/03/2020) 90 tablet 1   gabapentin  (NEURONTIN) 300 MG capsule Take 1 capsule (300 mg total) by mouth at bedtime. (Patient not taking: Reported on 12/03/2020) 30 capsule 0   lidocaine-prilocaine (EMLA) cream Apply 1 application topically as needed. (Patient not taking: Reported on 12/03/2020) 30 g 0   lisinopril (ZESTRIL) 40 MG tablet Take 1 tablet (40 mg total) by mouth daily. 90 tablet 3   ondansetron (ZOFRAN) 8 MG tablet Take 1 tablet (8 mg total) by mouth every 8 (eight) hours as needed for nausea or vomiting. (Patient not taking: Reported on 12/03/2020) 30 tablet 0   pantoprazole (PROTONIX) 40 MG tablet Take 1 tablet (40 mg total) by mouth daily. (Patient not taking: No sig reported) 30 tablet 0   prochlorperazine (COMPAZINE) 10 MG tablet Take 1 tablet (10 mg total) by mouth every 6 (six) hours as needed for nausea or vomiting. (Patient not taking: Reported on 12/03/2020) 30 tablet 0   sitaGLIPtin (JANUVIA) 100 MG tablet Take 1 tablet (100 mg total) by mouth daily. 90 tablet 3   No current facility-administered medications for this encounter.    Physical Findings: The patient is in no acute distress. Patient is alert and oriented.  height is $RemoveB'5\' 6"'DXAjBwNB$  (1.676 m) and weight is 154 lb 9.6 oz (70.1 kg). His temperature is 99 F (37.2 C). His blood pressure is 130/71 and his pulse is 81. His respiration is 18 and oxygen saturation is 100%. .  No significant changes. Lungs are clear to auscultation bilaterally. Heart has regular rate and rhythm. No palpable cervical, supraclavicular, or axillary adenopathy. Abdomen soft, non-tender, normal bowel sounds.  Patient is alert and oriented and seems his usual self.  Examination of scalp reveals hyperpigmentation changes and some dryness to the scalp.  No skin breakdown.  The oral cavity is free of any secondary infection.  I should mention that the patient has completed his Decadron taper.   Lab Findings: Lab Results  Component Value Date   WBC 3.6 (L) 11/30/2020   HGB 11.8 (L) 11/30/2020   HCT  35.8 (L) 11/30/2020   MCV 93.2 11/30/2020   PLT 209 11/30/2020    Radiographic Findings: No results found.  Impression:  Stage IA2  (cT1b, N0) non-small cell lung cancer (adenocarcinoma) presenting in the right upper lobe of the lung with brain metastasis  The patient is recovering from the effects of radiation.  Clinically doing much better compared to this presentation with brain metastasis while in the hospital.  Plan: Patient will start systemic therapy as above.  He will undergo MRI of the brain in late September or early October.  He will then follow-up with Dr. Mickeal Skinner as well as myself concerning his brain metastasis.    ____________________________________  Blair Promise, PhD, MD   This document serves as a record of services personally performed by Gery Pray, MD. It was created on his behalf by Roney Mans, a trained  medical scribe. The creation of this record is based on the scribe's personal observations and the provider's statements to them. This document has been checked and approved by the attending provider.

## 2020-12-03 ENCOUNTER — Telehealth: Payer: Self-pay | Admitting: Hematology and Oncology

## 2020-12-03 ENCOUNTER — Ambulatory Visit
Admission: RE | Admit: 2020-12-03 | Discharge: 2020-12-03 | Disposition: A | Discharge status: Home / Self Care | Payer: Medicare Other | Source: Ambulatory Visit | Attending: Radiation Oncology | Admitting: Radiation Oncology

## 2020-12-03 ENCOUNTER — Other Ambulatory Visit: Payer: Self-pay

## 2020-12-03 ENCOUNTER — Encounter: Payer: Self-pay | Admitting: Radiation Oncology

## 2020-12-03 VITALS — BP 130/71 | HR 81 | Temp 99.0°F | Resp 18 | Ht 66.0 in | Wt 154.6 lb

## 2020-12-03 DIAGNOSIS — Z7984 Long term (current) use of oral hypoglycemic drugs: Secondary | ICD-10-CM | POA: Insufficient documentation

## 2020-12-03 DIAGNOSIS — C7931 Secondary malignant neoplasm of brain: Secondary | ICD-10-CM | POA: Diagnosis not present

## 2020-12-03 DIAGNOSIS — Z79899 Other long term (current) drug therapy: Secondary | ICD-10-CM | POA: Diagnosis not present

## 2020-12-03 DIAGNOSIS — C3411 Malignant neoplasm of upper lobe, right bronchus or lung: Secondary | ICD-10-CM | POA: Insufficient documentation

## 2020-12-03 DIAGNOSIS — C3490 Malignant neoplasm of unspecified part of unspecified bronchus or lung: Secondary | ICD-10-CM

## 2020-12-03 DIAGNOSIS — Z923 Personal history of irradiation: Secondary | ICD-10-CM | POA: Insufficient documentation

## 2020-12-03 HISTORY — DX: Personal history of irradiation: Z92.3

## 2020-12-03 MED ORDER — SONAFINE EX EMUL
1.0000 | Freq: Once | CUTANEOUS | Status: AC
Start: 2020-12-03 — End: 2020-12-03
  Administered 2020-12-03: 1 via TOPICAL

## 2020-12-03 NOTE — Progress Notes (Signed)
Benjamin Willis. is here today for follow up post radiation to the brain.    They completed their radiation on: 10/30/2020  Does the patient complain of any of the following: Headache: denies Visual Changes: denies Hearing Changes: denies Nausea: denies Vomiting: denies Balance or coordination issues: denies Memory issues: denies  Is the patient currently on a Decadron regimen? : completed course  Additional comments if applicable: none   Vitals:   12/03/20 1125  BP: 130/71  Pulse: 81  Resp: 18  Temp: 99 F (37.2 C)  SpO2: 100%  Weight: 154 lb 9.6 oz (70.1 kg)  Height: 5\' 6"  (1.676 m)

## 2020-12-03 NOTE — Telephone Encounter (Signed)
Return pt vmail, no answer

## 2020-12-04 DIAGNOSIS — E1169 Type 2 diabetes mellitus with other specified complication: Secondary | ICD-10-CM | POA: Diagnosis not present

## 2020-12-04 DIAGNOSIS — H409 Unspecified glaucoma: Secondary | ICD-10-CM | POA: Diagnosis not present

## 2020-12-04 DIAGNOSIS — C3481 Malignant neoplasm of overlapping sites of right bronchus and lung: Secondary | ICD-10-CM | POA: Diagnosis not present

## 2020-12-04 DIAGNOSIS — E785 Hyperlipidemia, unspecified: Secondary | ICD-10-CM | POA: Diagnosis not present

## 2020-12-04 DIAGNOSIS — I10 Essential (primary) hypertension: Secondary | ICD-10-CM | POA: Diagnosis not present

## 2020-12-04 DIAGNOSIS — C7931 Secondary malignant neoplasm of brain: Secondary | ICD-10-CM | POA: Diagnosis not present

## 2020-12-06 DIAGNOSIS — E785 Hyperlipidemia, unspecified: Secondary | ICD-10-CM | POA: Diagnosis not present

## 2020-12-06 DIAGNOSIS — I10 Essential (primary) hypertension: Secondary | ICD-10-CM | POA: Diagnosis not present

## 2020-12-06 DIAGNOSIS — C3481 Malignant neoplasm of overlapping sites of right bronchus and lung: Secondary | ICD-10-CM | POA: Diagnosis not present

## 2020-12-06 DIAGNOSIS — C7931 Secondary malignant neoplasm of brain: Secondary | ICD-10-CM | POA: Diagnosis not present

## 2020-12-06 DIAGNOSIS — H409 Unspecified glaucoma: Secondary | ICD-10-CM | POA: Diagnosis not present

## 2020-12-06 DIAGNOSIS — E1169 Type 2 diabetes mellitus with other specified complication: Secondary | ICD-10-CM | POA: Diagnosis not present

## 2020-12-07 ENCOUNTER — Other Ambulatory Visit: Payer: Medicare Other

## 2020-12-07 DIAGNOSIS — I10 Essential (primary) hypertension: Secondary | ICD-10-CM | POA: Diagnosis not present

## 2020-12-07 DIAGNOSIS — H409 Unspecified glaucoma: Secondary | ICD-10-CM | POA: Diagnosis not present

## 2020-12-07 DIAGNOSIS — E1169 Type 2 diabetes mellitus with other specified complication: Secondary | ICD-10-CM | POA: Diagnosis not present

## 2020-12-07 DIAGNOSIS — E785 Hyperlipidemia, unspecified: Secondary | ICD-10-CM | POA: Diagnosis not present

## 2020-12-07 DIAGNOSIS — C3481 Malignant neoplasm of overlapping sites of right bronchus and lung: Secondary | ICD-10-CM | POA: Diagnosis not present

## 2020-12-07 DIAGNOSIS — C7931 Secondary malignant neoplasm of brain: Secondary | ICD-10-CM | POA: Diagnosis not present

## 2020-12-07 NOTE — Progress Notes (Signed)
Pharmacist Chemotherapy Monitoring - Initial Assessment    Anticipated start date: 12/14/20   The following has been reviewed per standard work regarding the patient's treatment regimen: The patient's diagnosis, treatment plan and drug doses, and organ/hematologic function Lab orders and baseline tests specific to treatment regimen  The treatment plan start date, drug sequencing, and pre-medications Prior authorization status  Patient's documented medication list, including drug-drug interaction screen and prescriptions for anti-emetics and supportive care specific to the treatment regimen The drug concentrations, fluid compatibility, administration routes, and timing of the medications to be used The patient's access for treatment and lifetime cumulative dose history, if applicable  The patient's medication allergies and previous infusion related reactions, if applicable   Changes made to treatment plan: N/A  Follow up needed:  Pending authorization for treatment    Larene Beach, [place], 12/07/2020  9:08 AM

## 2020-12-11 DIAGNOSIS — E785 Hyperlipidemia, unspecified: Secondary | ICD-10-CM | POA: Diagnosis not present

## 2020-12-11 DIAGNOSIS — I10 Essential (primary) hypertension: Secondary | ICD-10-CM | POA: Diagnosis not present

## 2020-12-11 DIAGNOSIS — C7931 Secondary malignant neoplasm of brain: Secondary | ICD-10-CM | POA: Diagnosis not present

## 2020-12-11 DIAGNOSIS — H409 Unspecified glaucoma: Secondary | ICD-10-CM | POA: Diagnosis not present

## 2020-12-11 DIAGNOSIS — C3481 Malignant neoplasm of overlapping sites of right bronchus and lung: Secondary | ICD-10-CM | POA: Diagnosis not present

## 2020-12-11 DIAGNOSIS — E1169 Type 2 diabetes mellitus with other specified complication: Secondary | ICD-10-CM | POA: Diagnosis not present

## 2020-12-12 ENCOUNTER — Other Ambulatory Visit: Payer: Self-pay

## 2020-12-12 ENCOUNTER — Encounter: Payer: Self-pay | Admitting: Internal Medicine

## 2020-12-12 ENCOUNTER — Other Ambulatory Visit: Payer: Self-pay | Admitting: Internal Medicine

## 2020-12-12 ENCOUNTER — Inpatient Hospital Stay: Payer: Medicare Other | Attending: Hematology and Oncology

## 2020-12-12 DIAGNOSIS — C7931 Secondary malignant neoplasm of brain: Secondary | ICD-10-CM | POA: Insufficient documentation

## 2020-12-12 DIAGNOSIS — Z5111 Encounter for antineoplastic chemotherapy: Secondary | ICD-10-CM | POA: Insufficient documentation

## 2020-12-12 DIAGNOSIS — Z5112 Encounter for antineoplastic immunotherapy: Secondary | ICD-10-CM | POA: Insufficient documentation

## 2020-12-12 DIAGNOSIS — C3491 Malignant neoplasm of unspecified part of right bronchus or lung: Secondary | ICD-10-CM | POA: Insufficient documentation

## 2020-12-12 NOTE — Progress Notes (Signed)
Met with patient and sister at registration to introduce myself as Arboriculturist and to offer available resources.  Discussed one-time $1000 [employer] grant to assist with personal expenses while going through treatment. Advised what is needed to apply.  Gave them my card if interested in applying and for any additional financial questions or concerns.

## 2020-12-13 ENCOUNTER — Ambulatory Visit (HOSPITAL_COMMUNITY)
Admission: RE | Admit: 2020-12-13 | Discharge: 2020-12-13 | Disposition: A | Discharge status: Home / Self Care | Payer: Medicare Other | Source: Ambulatory Visit | Attending: Hematology and Oncology | Admitting: Hematology and Oncology

## 2020-12-13 ENCOUNTER — Telehealth: Payer: Self-pay | Admitting: *Deleted

## 2020-12-13 ENCOUNTER — Other Ambulatory Visit: Payer: Self-pay

## 2020-12-13 ENCOUNTER — Encounter (HOSPITAL_COMMUNITY): Payer: Self-pay

## 2020-12-13 DIAGNOSIS — C3411 Malignant neoplasm of upper lobe, right bronchus or lung: Secondary | ICD-10-CM | POA: Insufficient documentation

## 2020-12-13 DIAGNOSIS — C7931 Secondary malignant neoplasm of brain: Secondary | ICD-10-CM | POA: Diagnosis not present

## 2020-12-13 DIAGNOSIS — I10 Essential (primary) hypertension: Secondary | ICD-10-CM | POA: Diagnosis not present

## 2020-12-13 DIAGNOSIS — Z87891 Personal history of nicotine dependence: Secondary | ICD-10-CM | POA: Diagnosis not present

## 2020-12-13 DIAGNOSIS — Z7984 Long term (current) use of oral hypoglycemic drugs: Secondary | ICD-10-CM | POA: Diagnosis not present

## 2020-12-13 DIAGNOSIS — Z79899 Other long term (current) drug therapy: Secondary | ICD-10-CM | POA: Insufficient documentation

## 2020-12-13 DIAGNOSIS — E119 Type 2 diabetes mellitus without complications: Secondary | ICD-10-CM | POA: Insufficient documentation

## 2020-12-13 DIAGNOSIS — Z452 Encounter for adjustment and management of vascular access device: Secondary | ICD-10-CM | POA: Diagnosis not present

## 2020-12-13 HISTORY — PX: IR IMAGING GUIDED PORT INSERTION: IMG5740

## 2020-12-13 LAB — GLUCOSE, CAPILLARY
Glucose-Capillary: 53 mg/dL — ABNORMAL LOW (ref 70–99)
Glucose-Capillary: 53 mg/dL — ABNORMAL LOW (ref 70–99)
Glucose-Capillary: 60 mg/dL — ABNORMAL LOW (ref 70–99)
Glucose-Capillary: 119 mg/dL — ABNORMAL HIGH (ref 70–99)

## 2020-12-13 IMAGING — XA IR IMAGING GUIDED PORT INSERTION
2 series · 2 of 2 positions shown · non-contrast
Comparison: none

INDICATION: 75-year-old male referred for port catheter

[Series 1: ir fluoro/shunt/fist · 1 of 1 slices shown]
[im 1/1]
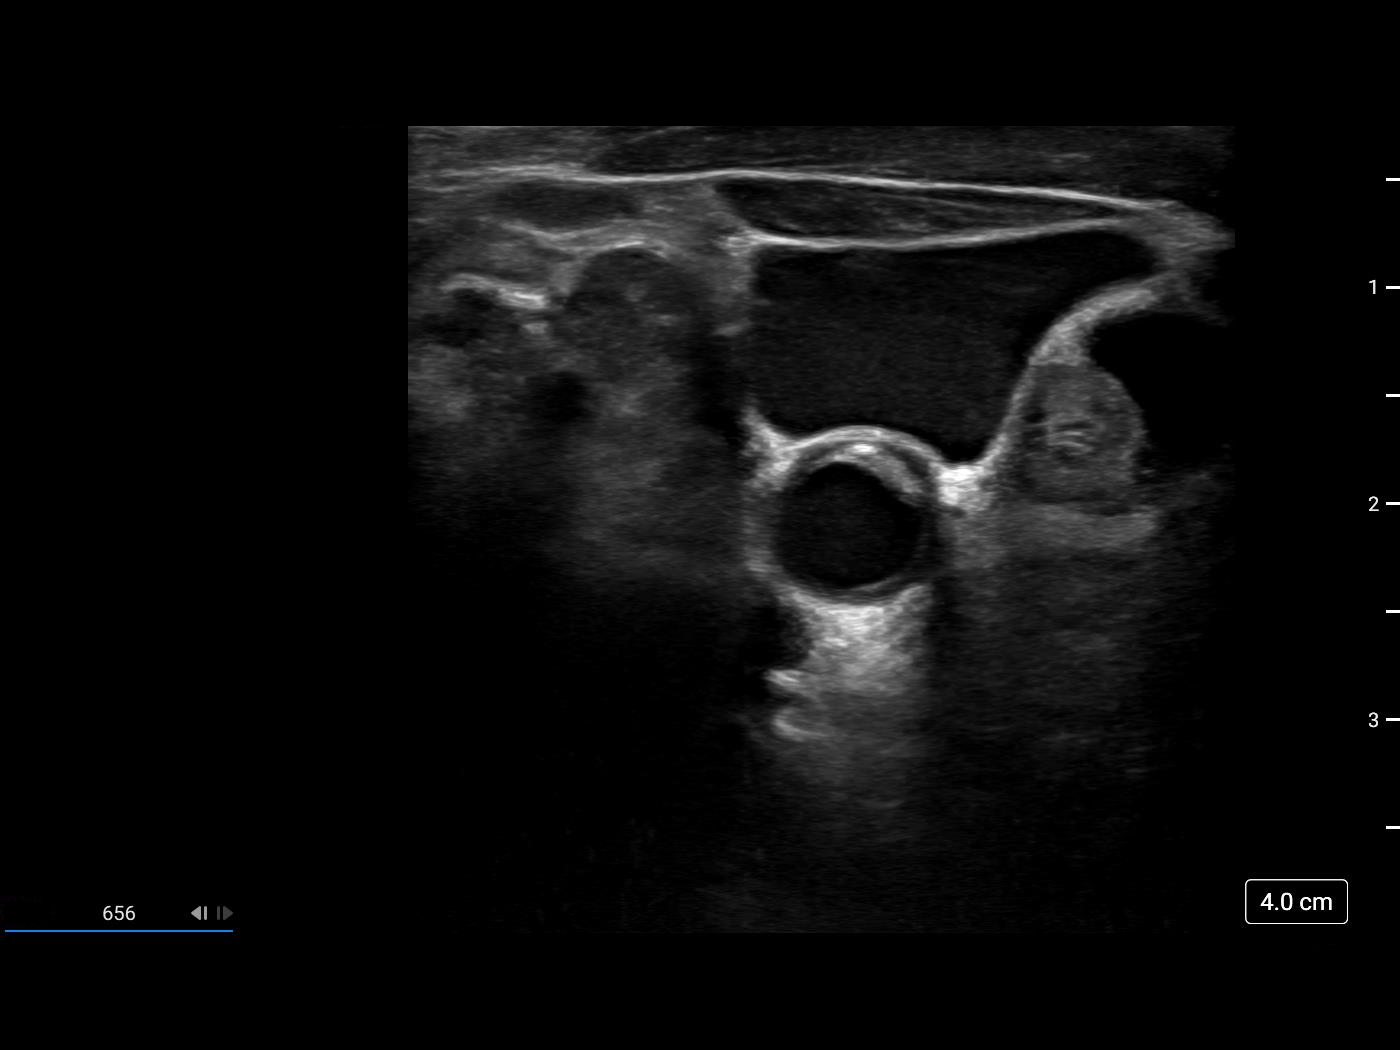

[Series 300: ir imaging guided port insertion · 1 of 1 slices shown]
[im 1/1]
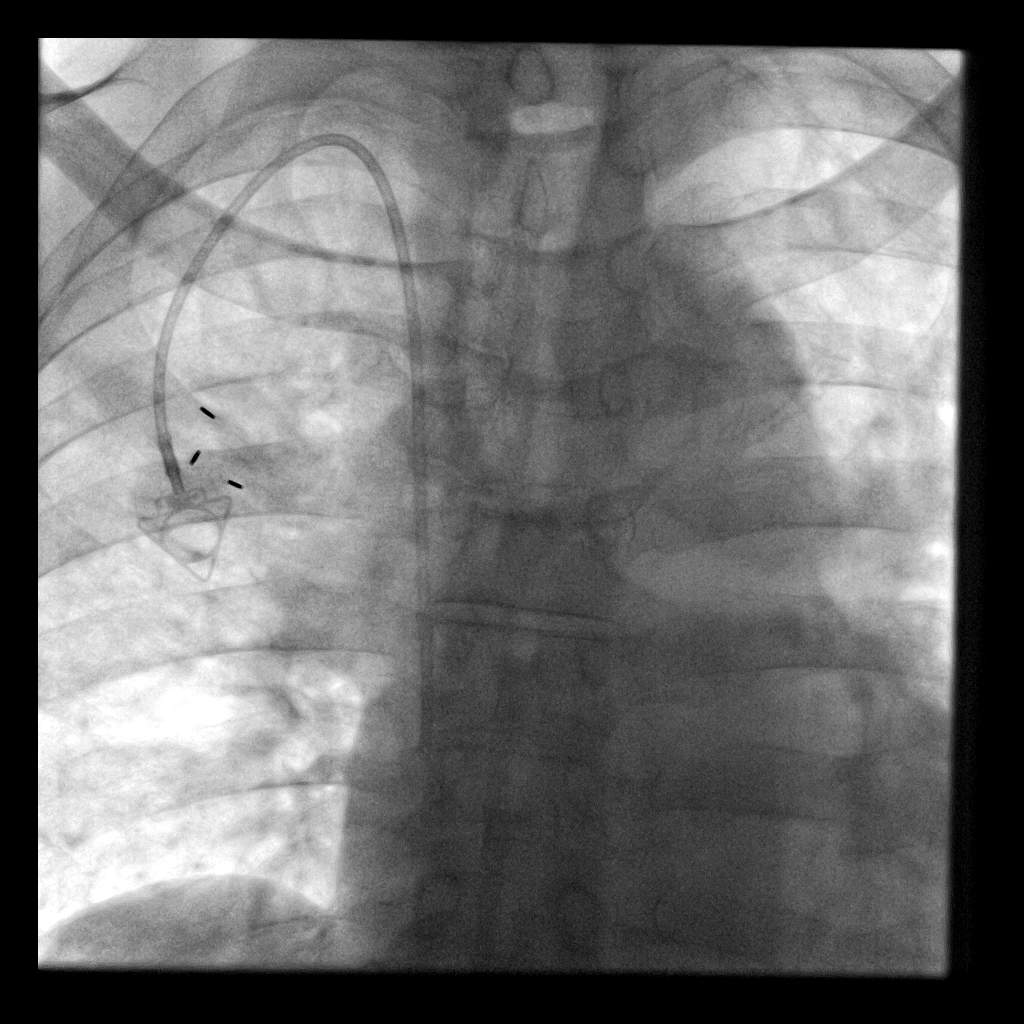

[2 of 2 positions shown; findings below may reference images not displayed]

EXAM:
IMAGE GUIDED PORT CATHETER

MEDICATIONS:
None

ANESTHESIA/SEDATION:
Moderate (conscious) sedation was employed during this procedure. A
total of Versed 2.0 mg and Fentanyl 100 mcg was administered
intravenously.

Moderate Sedation Time: 15 minutes. The patient's level of
consciousness and vital signs were monitored continuously by
radiology nursing throughout the procedure under my direct
supervision.

FLUOROSCOPY TIME:  Fluoroscopy Time: 0 minutes 6 seconds (1 mGy).

COMPLICATIONS:
None

PROCEDURE:
The procedure, risks, benefits, and alternatives were explained to
the patient. Questions regarding the procedure were encouraged and
answered. The patient understands and consents to the procedure.

Ultrasound survey was performed with images stored and sent to PACs.

The right neck and chest was prepped with chlorhexidine, and draped
in the usual sterile fashion using maximum barrier technique (cap
and mask, sterile gown, sterile gloves, large sterile sheet, hand
hygiene and cutaneous antiseptic). Local anesthesia was attained by
infiltration with 1% lidocaine without epinephrine.

Ultrasound demonstrated patency of the right internal jugular vein,
and this was documented with an image. Under real-time ultrasound
guidance, this vein was accessed with a 21 gauge micropuncture
needle and image documentation was performed. A small dermatotomy
was made at the access site with an 11 scalpel. A 0.018" wire was
advanced into the SVC and used to estimate the length of the
internal catheter. The access needle exchanged for a 4F
micropuncture vascular sheath. The 0.018" wire was then removed and
a 0.035" wire advanced into the IVC.



The venous access site was then serially dilated and a peel away
vascular sheath placed over the wire. The wire was removed and the
port catheter advanced into position under fluoroscopic guidance.
The catheter tip is positioned in the cavoatrial junction. This was
documented with a spot image. The portacatheter was then tested and
found to flush and aspirate well. The port was flushed with saline
followed by 100 units/mL heparinized saline.

The pocket was then closed in two layers using first subdermal
inverted interrupted absorbable sutures followed by a running
subcuticular suture. The epidermis was then sealed with Dermabond.
The dermatotomy at the venous access site was also seal with
Dermabond.

Patient tolerated the procedure well and remained hemodynamically
stable throughout.

No complications encountered and no significant blood loss
encountered
IMPRESSION: Status post right IJ port placement.

## 2020-12-13 MED ORDER — LIDOCAINE HCL (PF) 1 % IJ SOLN
INTRAMUSCULAR | Status: AC | PRN
  Administered 2020-12-13: 15 mL

## 2020-12-13 MED ORDER — HEPARIN SOD (PORK) LOCK FLUSH 100 UNIT/ML IV SOLN
INTRAVENOUS | Status: AC | PRN
  Administered 2020-12-13: 500 [IU] via INTRAVENOUS

## 2020-12-13 MED ORDER — MIDAZOLAM HCL 2 MG/2ML IJ SOLN
INTRAMUSCULAR | Status: AC
  Filled 2020-12-13: qty 4

## 2020-12-13 MED ORDER — FENTANYL CITRATE (PF) 100 MCG/2ML IJ SOLN
INTRAMUSCULAR | Status: AC | PRN
  Administered 2020-12-13 (×2): 50 ug via INTRAVENOUS

## 2020-12-13 MED ORDER — HEPARIN SOD (PORK) LOCK FLUSH 100 UNIT/ML IV SOLN
INTRAVENOUS | Status: AC
  Filled 2020-12-13: qty 5

## 2020-12-13 MED ORDER — SODIUM CHLORIDE 0.9 % IV SOLN: INTRAVENOUS | Status: DC

## 2020-12-13 MED ORDER — LIDOCAINE HCL 1 % IJ SOLN
INTRAMUSCULAR | Status: AC
  Filled 2020-12-13: qty 20

## 2020-12-13 MED ORDER — MIDAZOLAM HCL 2 MG/2ML IJ SOLN
INTRAMUSCULAR | Status: AC | PRN
  Administered 2020-12-13 (×2): 1 mg via INTRAVENOUS

## 2020-12-13 MED ORDER — DEXTROSE 50 % IV SOLN
25.0000 mL | INTRAVENOUS | Status: AC
  Administered 2020-12-13: 25 mL via INTRAVENOUS

## 2020-12-13 MED ORDER — DEXTROSE 50 % IV SOLN
INTRAVENOUS | Status: AC
  Filled 2020-12-13: qty 50

## 2020-12-13 MED ORDER — FENTANYL CITRATE (PF) 100 MCG/2ML IJ SOLN
INTRAMUSCULAR | Status: AC
  Filled 2020-12-13: qty 2

## 2020-12-13 NOTE — Consult Note (Signed)
Chief Complaint: Patient was seen in consultation today for port-a-cath placement    Referring Physician(s): Dorsey,John T IV  Supervising Physician: Juliet Rude  Patient Status: [facility] - Out-pt  History of Present Illness: Benjamin Willis. is a 75 y.o. 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 He has been referred from oncology for port-a-cath placement to receive chemotherapy.   Past Medical History:  Diagnosis Date   Allergy    Diabetes mellitus without complication ([place])    Type II   Glaucoma 10/16/2020   History of radiation therapy    Thorax- SBRT 02/21/19-02/28/19, Rt Lung- IMRT 02/29/20-04/15/20 Dr. Gery Pray   History of radiation therapy 10/30/2020   whole brain 10/17/2020-10/30/2020 Dr Gery Pray   Hyperlipidemia    Hypertension    Lung cancer ([place])    Stage IA2 (cT1b, N0) non-small cell lung cancer (adenocarcinoma)    Past Surgical History:  Procedure Laterality Date   ELECTROMAGNETIC NAVIGATION BROCHOSCOPY Right 02/02/2019   Procedure: VIDEO BRONCHOSCOPY WITH NAVIGATION AND FIDUCIAL PLACEMENT;  Surgeon: Garner Nash, DO;  Location: [place] [facility];  Service: Cardiopulmonary;  Laterality: Right;   FUDUCIAL PLACEMENT Right 02/02/2019   Procedure: Placement Of Fuducial Right Upper Lobe;  Surgeon: Garner Nash, DO;  Location: [place];  Service: Cardiopulmonary;  Laterality: Right;   none     VIDEO BRONCHOSCOPY WITH RADIAL ENDOBRONCHIAL ULTRASOUND Right 02/02/2019   Procedure: Video Bronchoscopy With Radial Endobronchial Ultrasound;  Surgeon: Garner Nash, DO;  Location: [place];  Service: Cardiopulmonary;  Laterality: Right;    Allergies: Patient has no known allergies.  Medications: Prior to Admission medications   Medication Sig Start Date End Date Taking? Authorizing Provider  albuterol (VENTOLIN HFA) 108 (90 Base) MCG/ACT inhaler Inhale 2 puffs into the lungs every 6 (six) hours as needed for wheezing or shortness of  breath. 01/19/19  Yes Icard, Bradley L, DO  amLODipine (NORVASC) 10 MG tablet Take 1 tablet (10 mg total) by mouth daily. 12/06/19  Yes Sagardia, Ines Bloomer, MD  blood glucose meter kit and supplies KIT Dispense based on patient and insurance preference. Use up to four times daily as directed. 10/23/20  Yes Elodia Florence., MD  Blood Pressure Monitoring (BLOOD PRESSURE MONITOR/ARM) DEVI Check BP once a day. DX I 10 04/18/19  Yes Sagardia, Ines Bloomer, MD  glipiZIDE (GLUCOTROL) 5 MG tablet TAKE 1 TABLET BY MOUTH TWICE DAILY WITH A MEAL 10/08/20  Yes Sagardia, Wyboo, MD  latanoprost (XALATAN) 0.005 % ophthalmic solution Place 1 drop into both eyes at bedtime. 01/24/20  Yes [provider]  levETIRAcetam (KEPPRA) 1000 MG tablet Take 1 tablet (1,000 mg total) by mouth 2 (two) times daily. 11/19/20 12/19/20 Yes Sagardia, Ines Bloomer, MD  lisinopril (ZESTRIL) 40 MG tablet Take 1 tablet (40 mg total) by mouth daily. 12/06/19 12/13/20 Yes Sagardia, Ines Bloomer, MD  metFORMIN (GLUCOPHAGE) 500 MG tablet TAKE 1 TABLET BY MOUTH TWICE DAILY WITH MEALS 11/22/20  Yes Sagardia, Ines Bloomer, MD  Multiple Vitamins-Minerals (MULTIVITAMIN MEN) TABS Take 1 tablet by mouth daily.   Yes [provider]  sitaGLIPtin (JANUVIA) 100 MG tablet Take 1 tablet (100 mg total) by mouth daily. 05/31/20 12/13/20 Yes Sagardia, Ines Bloomer, MD  cetirizine (ZYRTEC) 10 MG tablet Take 1 tablet (10 mg total) by mouth daily. Patient not taking: Reported on 12/03/2020 09/03/17   Jaynee Eagles, PA-C  folic acid (FOLVITE) 1 MG tablet  Take 1 tablet (1 mg total) by mouth daily. Patient not taking: Reported on 12/03/2020 11/30/20   Orson Slick, MD  gabapentin (NEURONTIN) 300 MG capsule Take 1 capsule (300 mg total) by mouth at bedtime. Patient not taking: Reported on 12/03/2020 11/19/20 12/19/20  Horald Pollen, MD  lidocaine-prilocaine (EMLA) cream Apply 1 application topically as needed. Patient not taking: Reported on  12/03/2020 11/30/20   Orson Slick, MD  ondansetron (ZOFRAN) 8 MG tablet Take 1 tablet (8 mg total) by mouth every 8 (eight) hours as needed for nausea or vomiting. Patient not taking: Reported on 12/03/2020 11/30/20   Orson Slick, MD  pantoprazole (PROTONIX) 40 MG tablet Take 1 tablet (40 mg total) by mouth daily. Patient not taking: No sig reported 11/19/20 12/19/20  Horald Pollen, MD  prochlorperazine (COMPAZINE) 10 MG tablet Take 1 tablet (10 mg total) by mouth every 6 (six) hours as needed for nausea or vomiting. Patient not taking: Reported on 12/03/2020 11/30/20   Orson Slick, MD     Family History  Problem Relation Age of Onset   Lung cancer Neg Hx     Social History   Socioeconomic History   Marital status: Single    Spouse name: Not on file   Number of children: 0   Years of education: Not on file   Highest education level: 9th grade  Occupational History   Not on file  Tobacco Use   Smoking status: Former    Packs/day: 1.00    Years: 48.00    Pack years: 48.00    Types: Cigarettes    Quit date: 02/20/2015    Years since quitting: 5.8   Smokeless tobacco: Former    Quit date: 10/2013  Vaping Use   Vaping Use: Never used  Substance and Sexual Activity   Alcohol use: No   Drug use: No   Sexual activity: Yes    Birth control/protection: Condom  Other Topics Concern   Not on file  Social History Narrative   Not on file   Social Determinants of Health   Financial Resource Strain: Not on file  Food Insecurity: Not on file  Transportation Needs: Not on file  Physical Activity: Not on file  Stress: Not on file  Social Connections: Not on file      Review of Systems  Constitutional:  Negative for appetite change, chills and fever.  Respiratory:  Negative for shortness of breath.   Cardiovascular:  Negative for chest pain.  Gastrointestinal:  Negative for abdominal pain, blood in stool, nausea and vomiting.   Vital Signs: BP (!) 144/83    Pulse 69   Temp 98 F (36.7 C) (Oral)   Resp 18   SpO2 98%   Physical Exam Constitutional:      Appearance: Normal appearance.  HENT:     Mouth/Throat:     Mouth: Mucous membranes are moist.     Pharynx: Oropharynx is clear.  Cardiovascular:     Rate and Rhythm: Normal rate and regular rhythm.  Pulmonary:     Effort: Pulmonary effort is normal.     Breath sounds: No stridor. No wheezing, rhonchi or rales.  Abdominal:     General: Bowel sounds are normal.     Palpations: Abdomen is soft.     Tenderness: There is no abdominal tenderness. There is no guarding.  Musculoskeletal:     Right lower leg: Edema present.     Left lower  leg: Edema present.     Comments: Mild edema to bilateral extremities   Skin:    General: Skin is warm and dry.  Neurological:     Mental Status: He is alert.  Psychiatric:        Mood and Affect: Mood normal.        Behavior: Behavior normal.        Thought Content: Thought content normal.        Judgment: Judgment normal.    Imaging: No results found.  Labs:  CBC: Recent Labs    10/23/20 0613 10/30/20 1108 11/01/20 1305 11/30/20 0958  WBC 8.3 11.5* 11.6* 3.6*  HGB 12.7* 13.3 12.8* 11.8*  HCT 39.0 39.4 36.6* 35.8*  PLT 184 231.0 229 209    COAGS: No results for input(s): INR, APTT in the last 8760 hours.  BMP: Recent Labs    10/22/20 0602 10/23/20 0613 10/30/20 1108 11/01/20 1305 11/30/20 0958  NA 136 135 135 138 144  K 4.6 4.8 4.7 4.9 4.0  CL 101 103 99 101 109  CO2 _0 GLUCOSE 228* 211* 217* 362* 154*  BUN 24* 32* 27* 29* 13  CALCIUM 8.6* 8.7* 9.2 9.3 9.1  CREATININE 0.85 0.92 0.85 1.16 0.80  GFRNONAA >60 >60  --  >60 >60    LIVER FUNCTION TESTS: Recent Labs    10/23/20 0613 10/30/20 1108 11/01/20 1305 11/30/20 0958  BILITOT 0.3 0.5 0.5 0.3  AST 45* 11 9* 12*  ALT 102* 50 44 15  ALKPHOS 57 59 66 72  PROT 5.5* 6.1 6.0* 6.2*  ALBUMIN 2.9* 3.7 3.2* 3.4*    TUMOR MARKERS: No results for  input(s): AFPTM, CEA, CA199, CHROMGRNA in the last 8760 hours.  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 He has been referred from oncology for port-a-cath placement to receive chemotherapy.   Risks and benefits of image guided port-a-catheter placement was discussed with the patient including, but not limited to bleeding, infection, pneumothorax, or fibrin sheath development and need for additional procedures.  All of the patient's questions were answered, patient is agreeable to proceed. Consent signed and in chart.    Thank you for this interesting consult.  I greatly enjoyed meeting [employer]. and look forward to participating in their care.  A copy of this report was sent to the requesting provider on this date.  Electronically Signed: Tyson Alias, NP 12/13/2020, 1:51 PM   I spent a total of 15 minutes in face to face in clinical consultation, greater than 50% of which was counseling/coordinating care for port-a-cath placement.

## 2020-12-13 NOTE — Discharge Instructions (Addendum)
Please call Interventional Radiology clinic [phone] with any questions or concerns.  You may remove your dressing and shower tomorrow.  DO NOT use EMLA cream on your port site for 2 weeks as this cream will remove surgical glue on your incision.  Implanted Port Insertion, Care After This sheet gives you information about how to care for yourself after your procedure. Your health care provider may also give you more specific instructions. If you have problems or questions, contact your health care provider. What can I expect after the procedure? After the procedure, it is common to have: Discomfort at the port insertion site. Bruising on the skin over the port. This should improve over 3-4 days. Follow these instructions at home: [facility] care After your port is placed, you will get a manufacturer's information card. The card has information about your port. Keep this card with you at all times. Take care of the port as told by your health care provider. Ask your health care provider if you or a family member can get training for taking care of the port at home. A home health care nurse may also take care of the port. Make sure to remember what type of port you have. Incision care Follow instructions from your health care provider about how to take care of your port insertion site. Make sure you: Wash your hands with soap and water before and after you change your bandage (dressing). If soap and water are not available, use hand sanitizer. Change your dressing as told by your health care provider. Leave stitches (sutures), skin glue, or adhesive strips in place. These skin closures may need to stay in place for 2 weeks or longer. If adhesive strip edges start to loosen and curl up, you may trim the loose edges. Do not remove adhesive strips completely unless your health care provider tells you to do that. Check your port insertion site every day for signs of infection. Check for: Redness,  swelling, or pain. Fluid or blood. Warmth. Pus or a bad smell.        Activity Return to your normal activities as told by your health care provider. Ask your health care provider what activities are safe for you. Do not lift anything that is heavier than 10 lb (4.5 kg), or the limit that you are told, until your health care provider says that it is safe. General instructions Take over-the-counter and prescription medicines only as told by your health care provider. Do not take baths, swim, or use a hot tub until your health care provider approves. Ask your health care provider if you may take showers. You may only be allowed to take sponge baths. Do not drive for 24 hours if you were given a sedative during your procedure. Wear a medical alert bracelet in case of an emergency. This will tell any health care providers that you have a port. Keep all follow-up visits as told by your health care provider. This is important. Contact a health care provider if: You cannot flush your port with saline as directed, or you cannot draw blood from the port. You have a fever or chills. You have redness, swelling, or pain around your port insertion site. You have fluid or blood coming from your port insertion site. Your port insertion site feels warm to the touch. You have pus or a bad smell coming from the port insertion site. Get help right away if: You have chest pain or shortness of breath. You have bleeding from  your port that you cannot control. Summary Take care of the port as told by your health care provider. Keep the manufacturer's information card with you at all times. Change your dressing as told by your health care provider. Contact a health care provider if you have a fever or chills or if you have redness, swelling, or pain around your port insertion site. Keep all follow-up visits as told by your health care provider. This information is not intended to replace advice given to you  by your health care provider. Make sure you discuss any questions you have with your health care provider. Document Revised: 11/17/2017 Document Reviewed: 11/17/2017 Elsevier Patient Education  2021 [place].   Moderate Conscious Sedation, Adult, Care After This sheet gives you information about how to care for yourself after your procedure. Your health care provider may also give you more specific instructions. If you have problems or questions, contact your health care provider. What can I expect after the procedure? After the procedure, it is common to have: Sleepiness for several hours. Impaired judgment for several hours. Difficulty with balance. Vomiting if you eat too soon. Follow these instructions at home: For the time period you were told by your health care provider: Rest. Do not participate in activities where you could fall or become injured. Do not drive or use machinery. Do not drink alcohol. Do not take sleeping pills or medicines that cause drowsiness. Do not make important decisions or sign legal documents. Do not take care of children on your own.        Eating and drinking Follow the diet recommended by your health care provider. Drink enough fluid to keep your urine pale yellow. If you vomit: Drink water, juice, or soup when you can drink without vomiting. Make sure you have little or no nausea before eating solid foods.    General instructions Take over-the-counter and prescription medicines only as told by your health care provider. Have a responsible adult stay with you for the time you are told. It is important to have someone help care for you until you are awake and alert. Do not smoke. Keep all follow-up visits as told by your health care provider. This is important. Contact a health care provider if: You are still sleepy or having trouble with balance after 24 hours. You feel light-headed. You keep feeling nauseous or you keep vomiting. You  develop a rash. You have a fever. You have redness or swelling around the IV site. Get help right away if: You have trouble breathing. You have new-onset confusion at home. Summary After the procedure, it is common to feel sleepy, have impaired judgment, or feel nauseous if you eat too soon. Rest after you get home. Know the things you should not do after the procedure. Follow the diet recommended by your health care provider and drink enough fluid to keep your urine pale yellow. Get help right away if you have trouble breathing or new-onset confusion at home. This information is not intended to replace advice given to you by your health care provider. Make sure you discuss any questions you have with your health care provider. Document Revised: 08/19/2019 Document Reviewed: 03/17/2019 Elsevier Patient Education  2021 [employer].

## 2020-12-13 NOTE — Telephone Encounter (Signed)
Received vm message from pt regarding her EMLA cream. She states her pharmacy said it was not sent in. Call made to pharmacy and spoke with pharmacist. The prescription is there and will be ready to pick this afternoon.  Call made to pt. No answer but was able to leave vm message for her to return call to discuss when to use the EMLA cream. Pt is getting port placed today and will not be able to use for at least 1-2 weeks as port heals.  Awaiting call back

## 2020-12-13 NOTE — Procedures (Signed)
Interventional Radiology Procedure Note  Procedure: Placement of a right IJ approach single lumen PowerPort.  Tip is positioned at the superior cavoatrial junction and catheter is ready for immediate use.  Complications: None Recommendations:  - Ok to shower tomorrow - Do not submerge for 7 days - Routine line care   Signed,  Ninel Abdella S. Leasha Goldberger, DO   

## 2020-12-14 ENCOUNTER — Other Ambulatory Visit: Payer: Self-pay

## 2020-12-14 ENCOUNTER — Encounter: Payer: Self-pay | Admitting: Hematology and Oncology

## 2020-12-14 ENCOUNTER — Inpatient Hospital Stay (HOSPITAL_BASED_OUTPATIENT_CLINIC_OR_DEPARTMENT_OTHER): Payer: Medicare Other | Admitting: Hematology and Oncology

## 2020-12-14 ENCOUNTER — Inpatient Hospital Stay: Payer: Medicare Other

## 2020-12-14 VITALS — BP 140/69 | HR 76 | Temp 98.0°F | Resp 19 | Ht 66.0 in | Wt 159.0 lb

## 2020-12-14 DIAGNOSIS — C7931 Secondary malignant neoplasm of brain: Secondary | ICD-10-CM | POA: Diagnosis not present

## 2020-12-14 DIAGNOSIS — C3490 Malignant neoplasm of unspecified part of unspecified bronchus or lung: Secondary | ICD-10-CM

## 2020-12-14 DIAGNOSIS — C3411 Malignant neoplasm of upper lobe, right bronchus or lung: Secondary | ICD-10-CM

## 2020-12-14 DIAGNOSIS — Z5112 Encounter for antineoplastic immunotherapy: Secondary | ICD-10-CM | POA: Diagnosis not present

## 2020-12-14 DIAGNOSIS — Z95828 Presence of other vascular implants and grafts: Secondary | ICD-10-CM

## 2020-12-14 DIAGNOSIS — Z5111 Encounter for antineoplastic chemotherapy: Secondary | ICD-10-CM | POA: Diagnosis not present

## 2020-12-14 DIAGNOSIS — C3491 Malignant neoplasm of unspecified part of right bronchus or lung: Secondary | ICD-10-CM | POA: Diagnosis not present

## 2020-12-14 LAB — CMP (CANCER CENTER ONLY)
ALT: 13 U/L (ref 0–44)
AST: 14 U/L — ABNORMAL LOW (ref 15–41)
Albumin: 3.6 g/dL (ref 3.5–5.0)
Alkaline Phosphatase: 78 U/L (ref 38–126)
Anion gap: 9 (ref 5–15)
BUN: 8 mg/dL (ref 8–23)
CO2: 25 mmol/L (ref 22–32)
Calcium: 9.1 mg/dL (ref 8.9–10.3)
Chloride: 106 mmol/L (ref 98–111)
Creatinine: 0.71 mg/dL (ref 0.61–1.24)
GFR, Estimated: >60 mL/min (ref >60)
Glucose, Bld: 124 mg/dL — ABNORMAL HIGH (ref 70–99)
Potassium: 3.9 mmol/L (ref 3.5–5.1)
Sodium: 140 mmol/L (ref 135–145)
Total Bilirubin: 0.4 mg/dL (ref 0.3–1.2)
Total Protein: 6.3 g/dL — ABNORMAL LOW (ref 6.5–8.1)

## 2020-12-14 LAB — CBC WITH DIFFERENTIAL (CANCER CENTER ONLY)
Abs Immature Granulocytes: 0.01 10*3/uL (ref 0.00–0.07)
Basophils Absolute: 0 10*3/uL (ref 0.0–0.1)
Basophils Relative: 0 %
Eosinophils Absolute: 0 10*3/uL (ref 0.0–0.5)
Eosinophils Relative: 1 %
HCT: 36.3 % — ABNORMAL LOW (ref 39.0–52.0)
Hemoglobin: 12 g/dL — ABNORMAL LOW (ref 13.0–17.0)
Immature Granulocytes: 0 %
Lymphocytes Relative: 22 %
Lymphs Abs: 1.2 10*3/uL (ref 0.7–4.0)
MCH: 30.8 pg (ref 26.0–34.0)
MCHC: 33.1 g/dL (ref 30.0–36.0)
MCV: 93.3 fL (ref 80.0–100.0)
Monocytes Absolute: 0.7 10*3/uL (ref 0.1–1.0)
Monocytes Relative: 12 %
Neutro Abs: 3.8 10*3/uL (ref 1.7–7.7)
Neutrophils Relative %: 65 %
Platelet Count: 237 10*3/uL (ref 150–400)
RBC: 3.89 MIL/uL — ABNORMAL LOW (ref 4.22–5.81)
RDW: 12.8 % (ref 11.5–15.5)
WBC Count: 5.7 10*3/uL (ref 4.0–10.5)
nRBC: 0 % (ref 0.0–0.2)

## 2020-12-14 MED ORDER — SODIUM CHLORIDE 0.9 % IV SOLN
500.0000 mg/m2 | Freq: Once | INTRAVENOUS | Status: AC
  Administered 2020-12-14: 900 mg via INTRAVENOUS
  Filled 2020-12-14: qty 20

## 2020-12-14 MED ORDER — SODIUM CHLORIDE 0.9 % IV SOLN
10.0000 mg | Freq: Once | INTRAVENOUS | Status: AC
  Administered 2020-12-14: 10 mg via INTRAVENOUS
  Filled 2020-12-14: qty 10

## 2020-12-14 MED ORDER — CYANOCOBALAMIN 1000 MCG/ML IJ SOLN
1000.0000 ug | Freq: Once | INTRAMUSCULAR | Status: AC
  Administered 2020-12-14: 1000 ug via INTRAMUSCULAR
  Filled 2020-12-14: qty 1

## 2020-12-14 MED ORDER — SODIUM CHLORIDE 0.9 % IV SOLN
150.0000 mg | Freq: Once | INTRAVENOUS | Status: AC
  Administered 2020-12-14: 150 mg via INTRAVENOUS
  Filled 2020-12-14: qty 150

## 2020-12-14 MED ORDER — SODIUM CHLORIDE 0.9 % IV SOLN
Freq: Once | INTRAVENOUS | Status: AC
Start: 2020-12-14 — End: 2020-12-14
  Filled 2020-12-14: qty 250

## 2020-12-14 MED ORDER — SODIUM CHLORIDE 0.9% FLUSH
10.0000 mL | INTRAVENOUS | Status: DC | PRN
  Administered 2020-12-14: 10 mL
  Filled 2020-12-14: qty 10

## 2020-12-14 MED ORDER — SODIUM CHLORIDE 0.9 % IV SOLN
458.0000 mg | Freq: Once | INTRAVENOUS | Status: AC
  Administered 2020-12-14: 460 mg via INTRAVENOUS
  Filled 2020-12-14: qty 46

## 2020-12-14 MED ORDER — SODIUM CHLORIDE 0.9% FLUSH
10.0000 mL | Freq: Once | INTRAVENOUS | Status: AC
Start: 2020-12-14 — End: 2020-12-14
  Administered 2020-12-14: 10 mL
  Filled 2020-12-14: qty 10

## 2020-12-14 MED ORDER — HEPARIN SOD (PORK) LOCK FLUSH 100 UNIT/ML IV SOLN
500.0000 [IU] | Freq: Once | INTRAVENOUS | Status: AC | PRN
  Administered 2020-12-14: 500 [IU]
  Filled 2020-12-14: qty 5

## 2020-12-14 MED ORDER — SODIUM CHLORIDE 0.9 % IV SOLN
200.0000 mg | Freq: Once | INTRAVENOUS | Status: AC
  Administered 2020-12-14: 200 mg via INTRAVENOUS
  Filled 2020-12-14: qty 8

## 2020-12-14 MED ORDER — PALONOSETRON HCL INJECTION 0.25 MG/5ML
0.2500 mg | Freq: Once | INTRAVENOUS | Status: AC
Start: 2020-12-14 — End: 2020-12-14
  Administered 2020-12-14: 0.25 mg via INTRAVENOUS
  Filled 2020-12-14: qty 5

## 2020-12-14 NOTE — Patient Instructions (Signed)
[facility] ONCOLOGY   Discharge Instructions: Thank you for choosing [facility] to provide your oncology and hematology care.   If you have a lab appointment with the [facility], please go directly to the [facility] and check in at the registration area.   Wear comfortable clothing and clothing appropriate for easy access to any Portacath or PICC line.   We strive to give you quality time with your provider. You may need to reschedule your appointment if you arrive late (15 or more minutes).  Arriving late affects you and other patients whose appointments are after yours.  Also, if you miss three or more appointments without notifying the office, you may be dismissed from the clinic at the provider's discretion.      For prescription refill requests, have your pharmacy contact our office and allow 72 hours for refills to be completed.    Today you received the following chemotherapy and/or immunotherapy agents: Pembrolizumab (Keytruda), Pemtrexed (Alimta), and Carboplatin      To help prevent nausea and vomiting after your treatment, we encourage you to take your nausea medication as directed.  BELOW ARE SYMPTOMS THAT SHOULD BE REPORTED IMMEDIATELY: *FEVER GREATER THAN 100.4 F (38 C) OR HIGHER *CHILLS OR SWEATING *NAUSEA AND VOMITING THAT IS NOT CONTROLLED WITH YOUR NAUSEA MEDICATION *UNUSUAL SHORTNESS OF BREATH *UNUSUAL BRUISING OR BLEEDING *URINARY PROBLEMS (pain or burning when urinating, or frequent urination) *BOWEL PROBLEMS (unusual diarrhea, constipation, pain near the anus) TENDERNESS IN MOUTH AND THROAT WITH OR WITHOUT PRESENCE OF ULCERS (sore throat, sores in mouth, or a toothache) UNUSUAL RASH, SWELLING OR PAIN  UNUSUAL VAGINAL DISCHARGE OR ITCHING   Items with * indicate a potential emergency and should be followed up as soon as possible or go to the Emergency Department if any problems should occur.  Please show the CHEMOTHERAPY  ALERT CARD or IMMUNOTHERAPY ALERT CARD at check-in to the Emergency Department and triage nurse.  Should you have questions after your visit or need to cancel or reschedule your appointment, please contact [facility]  Dept: [phone]  and follow the prompts.  Office hours are 8:00 a.m. to 4:30 p.m. Monday - Friday. Please note that voicemails left after 4:00 p.m. may not be returned until the following business day.  We are closed weekends and major holidays. You have access to a nurse at all times for urgent questions. Please call the main number to the clinic Dept: [phone] and follow the prompts.   For any non-urgent questions, you may also contact your provider using MyChart. We now offer e-Visits for anyone 40 and older to request care online for non-urgent symptoms. For details visit [email or web address].   Also download the MyChart app! Go to the app store, search "MyChart", open the app, select [facility], and log in with your MyChart username and password.  Due to Covid, a mask is required upon entering the hospital/clinic. If you do not have a mask, one will be given to you upon arrival. For doctor visits, patients may have 1 support person aged 43 or older with them. For treatment visits, patients cannot have anyone with them due to current Covid guidelines and our immunocompromised population.

## 2020-12-17 ENCOUNTER — Emergency Department (HOSPITAL_COMMUNITY): Payer: Medicare Other

## 2020-12-17 ENCOUNTER — Other Ambulatory Visit: Payer: Self-pay

## 2020-12-17 ENCOUNTER — Encounter (HOSPITAL_COMMUNITY): Payer: Self-pay

## 2020-12-17 ENCOUNTER — Emergency Department (HOSPITAL_COMMUNITY)
Admission: EM | Admit: 2020-12-17 | Discharge: 2020-12-17 | Disposition: A | Discharge status: Home / Self Care | Payer: Medicare Other | Attending: Emergency Medicine | Admitting: Emergency Medicine

## 2020-12-17 DIAGNOSIS — Z85118 Personal history of other malignant neoplasm of bronchus and lung: Secondary | ICD-10-CM | POA: Diagnosis not present

## 2020-12-17 DIAGNOSIS — M79642 Pain in left hand: Secondary | ICD-10-CM | POA: Diagnosis not present

## 2020-12-17 DIAGNOSIS — M79641 Pain in right hand: Secondary | ICD-10-CM | POA: Diagnosis not present

## 2020-12-17 DIAGNOSIS — I10 Essential (primary) hypertension: Secondary | ICD-10-CM | POA: Diagnosis not present

## 2020-12-17 DIAGNOSIS — M7989 Other specified soft tissue disorders: Secondary | ICD-10-CM | POA: Diagnosis not present

## 2020-12-17 DIAGNOSIS — E119 Type 2 diabetes mellitus without complications: Secondary | ICD-10-CM | POA: Insufficient documentation

## 2020-12-17 DIAGNOSIS — R609 Edema, unspecified: Secondary | ICD-10-CM

## 2020-12-17 DIAGNOSIS — Z79899 Other long term (current) drug therapy: Secondary | ICD-10-CM | POA: Insufficient documentation

## 2020-12-17 DIAGNOSIS — Z7984 Long term (current) use of oral hypoglycemic drugs: Secondary | ICD-10-CM | POA: Insufficient documentation

## 2020-12-17 DIAGNOSIS — Z87891 Personal history of nicotine dependence: Secondary | ICD-10-CM | POA: Diagnosis not present

## 2020-12-17 DIAGNOSIS — M189 Osteoarthritis of first carpometacarpal joint, unspecified: Secondary | ICD-10-CM | POA: Diagnosis not present

## 2020-12-17 LAB — BASIC METABOLIC PANEL
Anion gap: 10 (ref 5–15)
BUN: 13 mg/dL (ref 8–23)
CO2: 26 mmol/L (ref 22–32)
Calcium: 9.1 mg/dL (ref 8.9–10.3)
Chloride: 102 mmol/L (ref 98–111)
Creatinine, Ser: 0.79 mg/dL (ref 0.61–1.24)
GFR, Estimated: >60 mL/min (ref >60)
Glucose, Bld: 217 mg/dL — ABNORMAL HIGH (ref 70–99)
Potassium: 3.9 mmol/L (ref 3.5–5.1)
Sodium: 138 mmol/L (ref 135–145)

## 2020-12-17 LAB — CBC WITH DIFFERENTIAL/PLATELET
Abs Immature Granulocytes: 0.01 10*3/uL (ref 0.00–0.07)
Basophils Absolute: 0 10*3/uL (ref 0.0–0.1)
Basophils Relative: 0 %
Eosinophils Absolute: 0.1 10*3/uL (ref 0.0–0.5)
Eosinophils Relative: 1 %
HCT: 38 % — ABNORMAL LOW (ref 39.0–52.0)
Hemoglobin: 12.2 g/dL — ABNORMAL LOW (ref 13.0–17.0)
Immature Granulocytes: 0 %
Lymphocytes Relative: 22 %
Lymphs Abs: 1.1 10*3/uL (ref 0.7–4.0)
MCH: 30.7 pg (ref 26.0–34.0)
MCHC: 32.1 g/dL (ref 30.0–36.0)
MCV: 95.5 fL (ref 80.0–100.0)
Monocytes Absolute: 0.3 10*3/uL (ref 0.1–1.0)
Monocytes Relative: 6 %
Neutro Abs: 3.5 10*3/uL (ref 1.7–7.7)
Neutrophils Relative %: 71 %
Platelets: 266 10*3/uL (ref 150–400)
RBC: 3.98 MIL/uL — ABNORMAL LOW (ref 4.22–5.81)
RDW: 12.8 % (ref 11.5–15.5)
WBC: 4.9 10*3/uL (ref 4.0–10.5)
nRBC: 0 % (ref 0.0–0.2)

## 2020-12-17 IMAGING — CR DG HAND COMPLETE 3+V*L*
3 series · 3 of 3 positions shown · non-contrast
Comparison: None.

CLINICAL DATA: Left hand swelling for few days, no reported injury

EXAM:
LEFT HAND - COMPLETE 3+ VIEW

[x hand pa left]
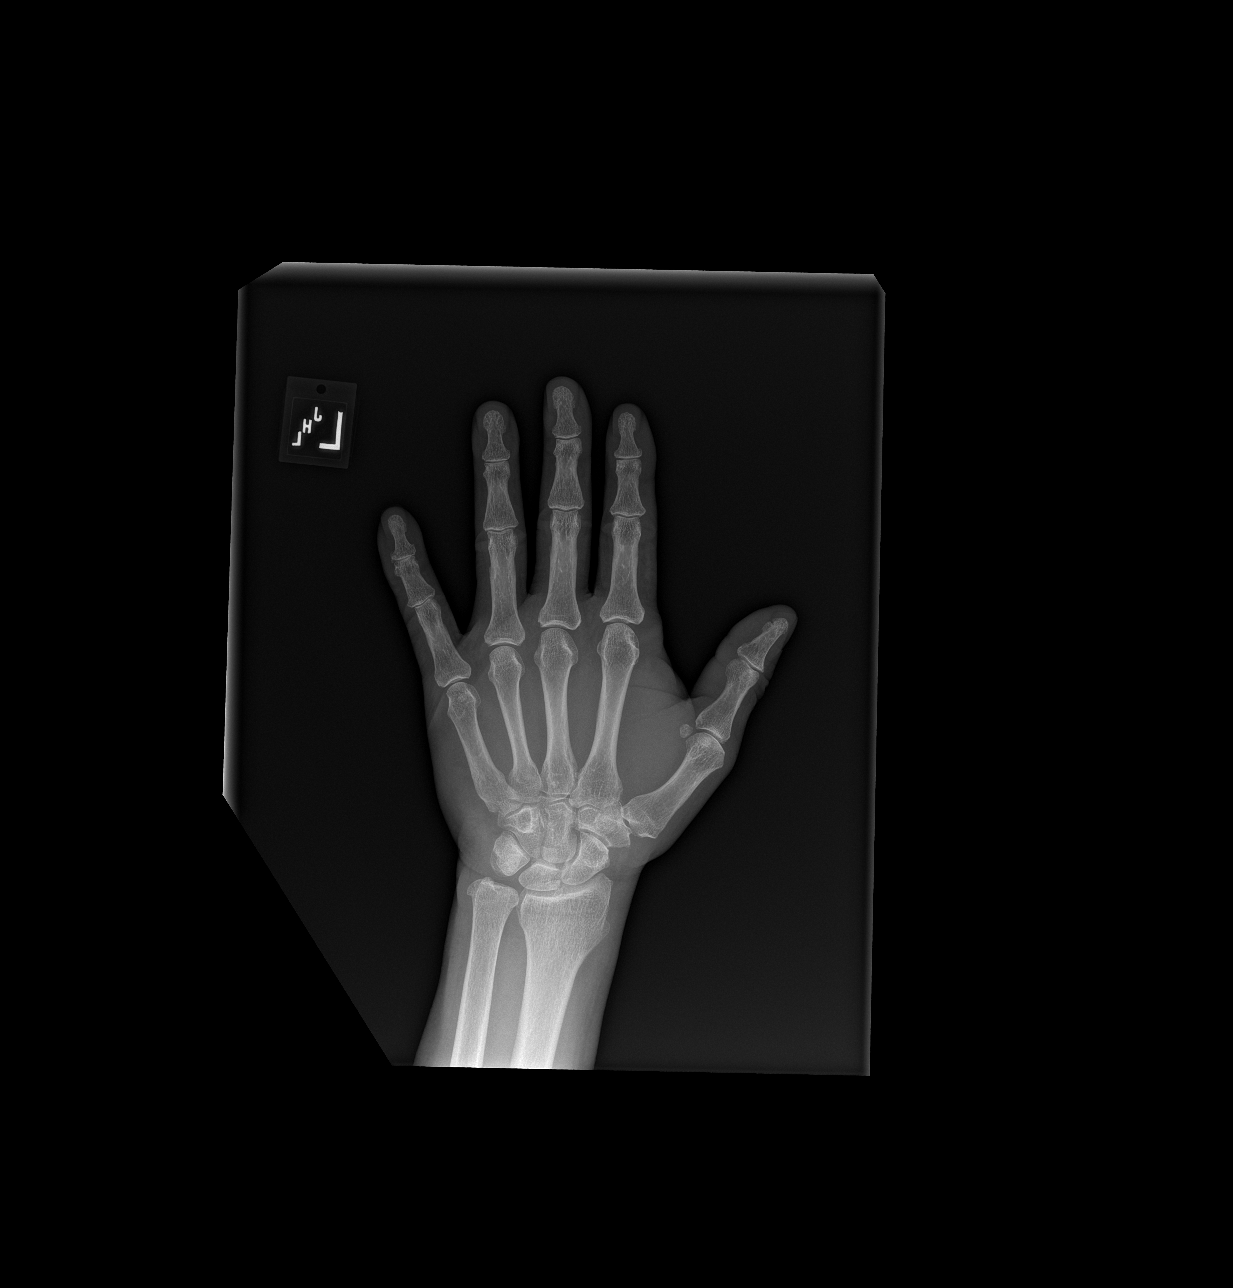

[x hand obl left]
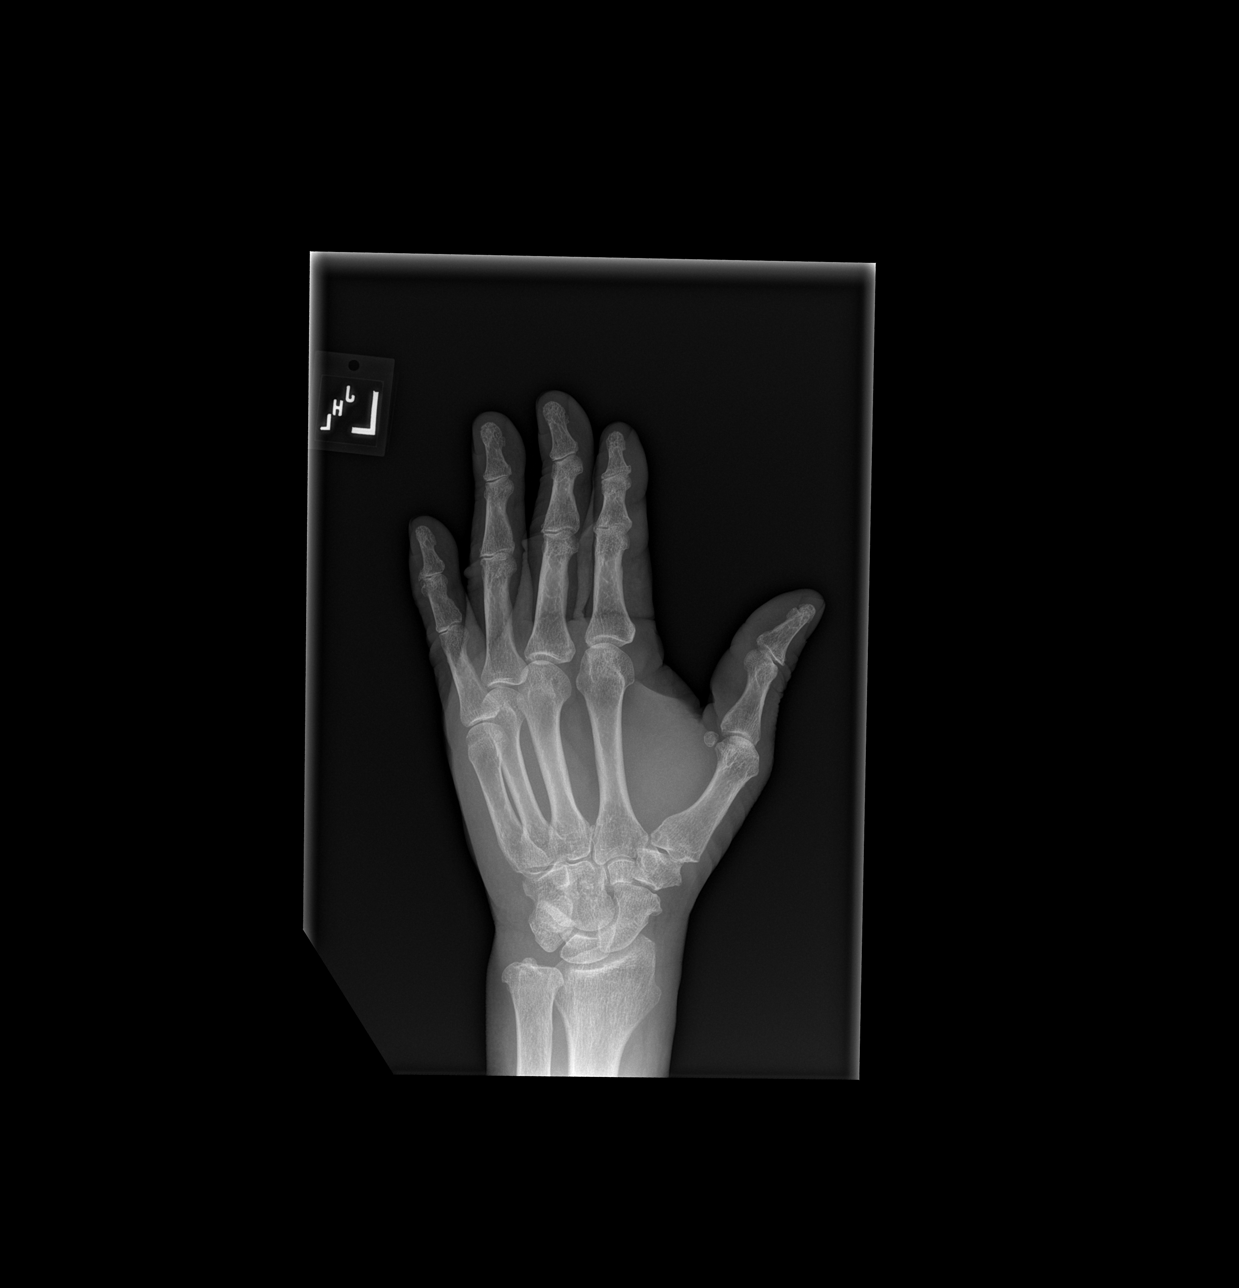

[x hand lat left]
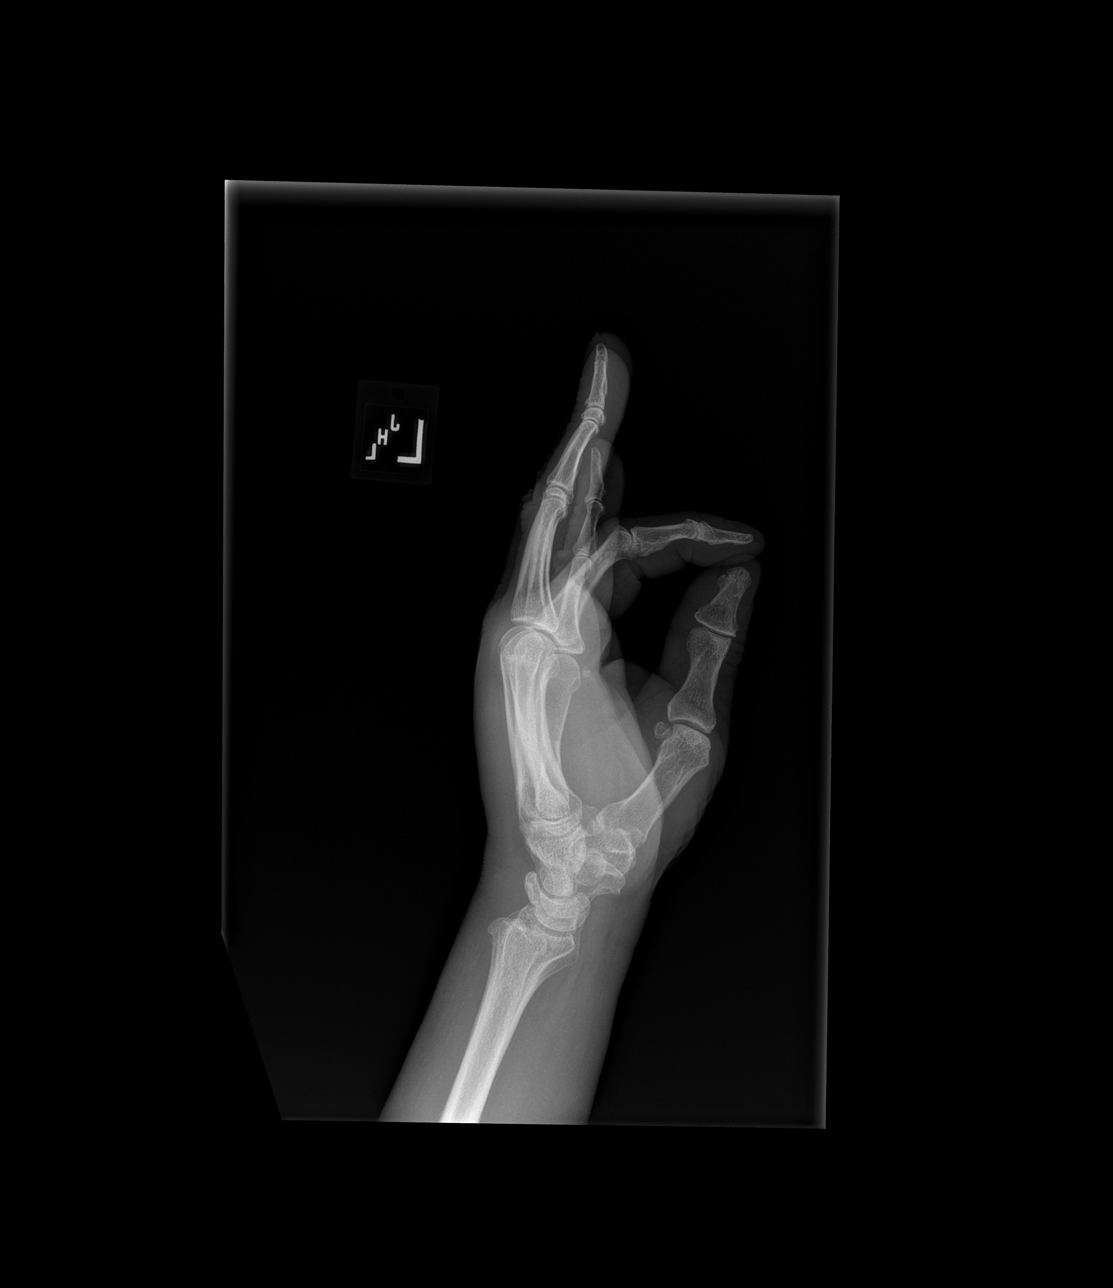

[3 of 3 positions shown; findings below may reference images not displayed]

FINDINGS: No fracture or dislocation. No focal osseous lesions. Mild
osteoarthritis in the first carpometacarpal joint and in the fifth
distal interphalangeal joint. No radiopaque foreign bodies. Dorsal
left hand soft tissue swelling.
IMPRESSION: No acute osseous abnormality. Dorsal left hand soft tissue swelling.
Mild osteoarthritis in the first carpometacarpal joint and fifth
distal interphalangeal joint.

## 2020-12-17 NOTE — ED Provider Notes (Signed)
Emergency Medicine Provider Triage Evaluation Note  Benjamin Willis. , a 75 y.o. male  was evaluated in triage.  Pt complains of left hand edema that started today. History of lung cancer currently undergoing chemotherapy. No known injury.  Patient is left-hand dominant.  No history of gout.  Admits to mild pain with movement. Denies swelling elsewhere.   Review of Systems  Positive: Hand pain Negative: fever  Physical Exam  There were no vitals taken for this visit. Gen:   Awake, no distress   Resp:  Normal effort  MSK:   Moves extremities without difficulty  Other:    Medical Decision Making  Medically screening exam initiated at 1:21 PM.  Appropriate orders placed.  [name] was informed that the remainder of the evaluation will be completed by another provider, this initial triage assessment does not replace that evaluation, and the importance of remaining in the ED until their evaluation is complete.  Left hand edema X-ray labs   Karie Kirks 12/17/20 [place], Ankit, MD 12/18/20 [phone]

## 2020-12-17 NOTE — Discharge Instructions (Signed)
Please return tomorrow and follow the instructions for getting your ultrasound

## 2020-12-17 NOTE — ED Provider Notes (Signed)
[facility] DEPT Provider Note   CSN: [id number] Arrival date & time: 12/17/20  1239     History Chief Complaint  Patient presents with   Hand Pain    Fate Latrail Pounders. is a 75 y.o. male.  Who presents emergency department chief complaint of hand pain.  Patient complains of swelling to the left hand.  He has a past medical history of lung cancer, brain metastases, diabetes.  Patient states that he just woke up with swelling over the dorsum of the left hand and pain in the dorsal surface of the left forearm.  Pain is worse with movement of the wrist.  He denies any injuries.  He denies chest pain or shortness of breath.   Hand Pain      Past Medical History:  Diagnosis Date   Allergy    Diabetes mellitus without complication ([place])    Type II   Glaucoma 10/16/2020   History of radiation therapy    [facility]- SBRT 02/21/19-02/28/19, [facility]- IMRT 02/29/20-04/15/20 Dr. Gery Pray   History of radiation therapy 10/30/2020   whole brain 10/17/2020-10/30/2020 Dr Gery Pray   Hyperlipidemia    Hypertension    Lung cancer ([place])    Stage IA2 (cT1b, N0) non-small cell lung cancer (adenocarcinoma)    Patient Active Problem List   Diagnosis Date Noted   Port-A-Cath in place 12/14/2020   Brain metastases [facility])    Palliative care by specialist    Metastatic lung cancer (metastasis from lung to other site) [facility]) 10/16/2020   Diabetes mellitus type 2 in nonobese ([place]) 10/16/2020   Glaucoma 10/16/2020   Lung cancer metastatic to brain ([place]) 10/16/2020   Atherosclerosis of aorta ([place]) 05/31/2020   Primary cancer of right upper lobe of lung ([place]) 02/07/2019   Lung nodule 02/02/2019   Hypertension associated with type 2 diabetes mellitus ([address]) 05/24/2018   Dyslipidemia 05/24/2018   Dyslipidemia associated with type 2 diabetes mellitus ([place]) 06/09/2017    Past Surgical History:  Procedure Laterality Date   ELECTROMAGNETIC NAVIGATION BROCHOSCOPY Right  02/02/2019   Procedure: VIDEO BRONCHOSCOPY WITH NAVIGATION AND FIDUCIAL PLACEMENT;  Surgeon: Garner Nash, DO;  Location: [place];  Service: Cardiopulmonary;  Laterality: Right;   FUDUCIAL PLACEMENT Right 02/02/2019   Procedure: Placement Of Fuducial Right Upper Lobe;  Surgeon: Garner Nash, DO;  Location: [place];  Service: Cardiopulmonary;  Laterality: Right;   IR IMAGING GUIDED PORT INSERTION  12/13/2020   none     VIDEO BRONCHOSCOPY WITH RADIAL ENDOBRONCHIAL ULTRASOUND Right 02/02/2019   Procedure: Video Bronchoscopy With Radial Endobronchial Ultrasound;  Surgeon: Garner Nash, DO;  Location: [place];  Service: Cardiopulmonary;  Laterality: Right;       Family History  Problem Relation Age of Onset   Lung cancer Neg Hx     Social History   Tobacco Use   Smoking status: Former    Packs/day: 1.00    Years: 48.00    Pack years: 48.00    Types: Cigarettes    Quit date: 02/20/2015    Years since quitting: 5.8   Smokeless tobacco: Former    Quit date: 10/2013  Vaping Use   Vaping Use: Never used  Substance Use Topics   Alcohol use: No   Drug use: No    Home Medications Prior to Admission medications   Medication Sig Start Date End Date Taking? Authorizing Provider  albuterol (VENTOLIN HFA) 108 (90 Base) MCG/ACT inhaler Inhale 2 puffs into the lungs every  6 (six) hours as needed for wheezing or shortness of breath. 01/19/19   Icard, Leory Plowman L, DO  amLODipine (NORVASC) 10 MG tablet Take 1 tablet (10 mg total) by mouth daily. 12/06/19   Horald Pollen, MD  blood glucose meter kit and supplies KIT Dispense based on patient and insurance preference. Use up to four times daily as directed. 10/23/20   Elodia Florence., MD  Blood Pressure Monitoring (BLOOD PRESSURE MONITOR/ARM) DEVI Check BP once a day. DX I 10 04/18/19   Horald Pollen, MD  cetirizine (ZYRTEC) 10 MG tablet Take 1 tablet (10 mg total) by mouth daily. Patient not taking: Reported on 12/03/2020 09/03/17    Jaynee Eagles, PA-C  folic acid (FOLVITE) 1 MG tablet Take 1 tablet (1 mg total) by mouth daily. Patient not taking: Reported on 12/03/2020 11/30/20   Orson Slick, MD  gabapentin (NEURONTIN) 300 MG capsule Take 1 capsule (300 mg total) by mouth at bedtime. Patient not taking: Reported on 12/03/2020 11/19/20 12/19/20  Horald Pollen, MD  glipiZIDE (GLUCOTROL) 5 MG tablet TAKE 1 TABLET BY MOUTH TWICE DAILY WITH A MEAL 10/08/20   Sagardia, Ines Bloomer, MD  latanoprost (XALATAN) 0.005 % ophthalmic solution Place 1 drop into both eyes at bedtime. 01/24/20   [provider]  levETIRAcetam (KEPPRA) 1000 MG tablet Take 1 tablet (1,000 mg total) by mouth 2 (two) times daily. 11/19/20 12/19/20  Horald Pollen, MD  lidocaine-prilocaine (EMLA) cream Apply 1 application topically as needed. Patient not taking: Reported on 12/03/2020 11/30/20   Orson Slick, MD  lisinopril (ZESTRIL) 40 MG tablet Take 1 tablet (40 mg total) by mouth daily. 12/06/19 12/13/20  Horald Pollen, MD  metFORMIN (GLUCOPHAGE) 500 MG tablet TAKE 1 TABLET BY MOUTH TWICE DAILY WITH MEALS 11/22/20   Sagardia, Ines Bloomer, MD  Multiple Vitamins-Minerals (MULTIVITAMIN MEN) TABS Take 1 tablet by mouth daily.    [provider]  ondansetron (ZOFRAN) 8 MG tablet Take 1 tablet (8 mg total) by mouth every 8 (eight) hours as needed for nausea or vomiting. Patient not taking: Reported on 12/03/2020 11/30/20   Orson Slick, MD  pantoprazole (PROTONIX) 40 MG tablet Take 1 tablet (40 mg total) by mouth daily. Patient not taking: No sig reported 11/19/20 12/19/20  Horald Pollen, MD  prochlorperazine (COMPAZINE) 10 MG tablet Take 1 tablet (10 mg total) by mouth every 6 (six) hours as needed for nausea or vomiting. Patient not taking: Reported on 12/03/2020 11/30/20   Orson Slick, MD  sitaGLIPtin (JANUVIA) 100 MG tablet Take 1 tablet (100 mg total) by mouth daily. 05/31/20 12/13/20  Horald Pollen, MD     Allergies    Patient has no known allergies.  Review of Systems   Review of Systems Ten systems reviewed and are negative for acute change, except as noted in the HPI.   Physical Exam Updated Vital Signs BP 140/90   Pulse 72   Temp 99.4 F (37.4 C) (Oral)   Resp 17   SpO2 98%   Physical Exam Physical Exam  Nursing note and vitals reviewed. Constitutional: He appears well-developed and well-nourished. No distress.  HENT:  Head: Normocephalic and atraumatic.  Eyes: Conjunctivae normal are normal. No scleral icterus.  Neck: Normal range of motion. Neck supple.  Cardiovascular: Normal rate, regular rhythm and normal heart sounds.   Pulmonary/Chest: Effort normal and breath sounds normal. No respiratory distress.  Abdominal: Soft. There is no  tenderness.  Musculoskeletal: Mild edema over the left dorsal hand surface, tenderness over the extensor tendons.  No crepitus. Neurological: He is alert.  Skin: Skin is warm and dry. He is not diaphoretic.  Psychiatric: His behavior is normal.   ED Results / Procedures / Treatments   Labs (all labs ordered are listed, but only abnormal results are displayed) Labs Reviewed  CBC WITH DIFFERENTIAL/PLATELET - Abnormal; Notable for the following components:      Result Value   RBC 3.98 (*)    Hemoglobin 12.2 (*)    HCT 38.0 (*)    All other components within normal limits  BASIC METABOLIC PANEL - Abnormal; Notable for the following components:   Glucose, Bld 217 (*)    All other components within normal limits    EKG None  Radiology DG Hand Complete Left  Result Date: 12/17/2020 CLINICAL DATA:  Left hand swelling for few days, no reported injury EXAM: LEFT HAND - COMPLETE 3+ VIEW COMPARISON:  None. FINDINGS: No fracture or dislocation. No focal osseous lesions. 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 No radiopaque foreign bodies. Dorsal left hand soft tissue swelling.  IMPRESSION: No acute osseous abnormality. Dorsal left hand soft tissue swelling. 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 Electronically Signed   By: Ilona Sorrel M.D.   On: 12/17/2020 14:52    Procedures Procedures   Medications Ordered in ED Medications - No data to display  ED Course  I have reviewed the triage vital signs and the nursing notes.  Pertinent labs & imaging results that were available during my care of the patient were reviewed by me and considered in my medical decision making (see chart for details).    MDM Rules/Calculators/A&P                         Patient here with some pain and swelling.  No evidence of cellulitis.  Patient placed in wrist splint.  We will have him return tomorrow for DVT ultrasound.  Seen and shared visit with Dr. Campbell Stall Final Clinical Impression(s) / ED Diagnoses Final diagnoses:  None    Rx / DC Orders ED Discharge Orders     None        Margarita Mail, PA-C 60/45/40 [id number]    Lianne Cure, DO 91/47/82 [id number]

## 2020-12-17 NOTE — ED Triage Notes (Signed)
Pt reports left hand swelling that began a few days ago. Pt denies any injury.

## 2020-12-18 ENCOUNTER — Encounter: Payer: Self-pay | Admitting: Hematology and Oncology

## 2020-12-18 ENCOUNTER — Telehealth: Payer: Self-pay | Admitting: Hematology and Oncology

## 2020-12-18 ENCOUNTER — Ambulatory Visit (HOSPITAL_COMMUNITY)
Admission: RE | Admit: 2020-12-18 | Discharge: 2020-12-18 | Disposition: A | Discharge status: Home / Self Care | Payer: Medicare Other | Source: Ambulatory Visit | Attending: Emergency Medicine | Admitting: Emergency Medicine

## 2020-12-18 DIAGNOSIS — R609 Edema, unspecified: Secondary | ICD-10-CM | POA: Diagnosis not present

## 2020-12-18 NOTE — Progress Notes (Signed)
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  This was followed by an MRI of the brain with and without contrast which showed 4 brain lesions consistent with metastases, extensive vasogenic edema associated with the posterior right frontal lesion without midline shift. 6/15-6/28/2022: 10 fractions of WBRT with Rad/Onc 11/23/2020: Guardant 360 returns with no targetable mutation.  12/14/2020: Cycle 1 Day 1 of Carbo/Pem/Pem  Interval History:  Benjamin Willis. 75 y.o. 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 The patient's last visit was on 11/30/2020.  In the interim since the last visit he has completed port placement and chemotherapy education.  On exam today Mrs. Stockinger is unaccompanied.  He notes that he has been well in the interim since her last visit.  He has not been having any issues with fevers, chills, sweats, nausea, vomiting or diarrhea.  He notes that he is not having any headache or vision changes.  He reports that his energy has been good and his appetite has been "okay".. A full 10 point ROS is listed below.  The bulk of our discussion focused on assuring that he had everything he required to proceed with chemotherapy treatment and to answer any last-minute questions  he may have.  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 Gery Pray   History of radiation therapy 10/30/2020   whole brain 10/17/2020-10/30/2020 Dr Gery Pray   Hyperlipidemia    Hypertension    Lung cancer ([place])    Stage IA2 (cT1b, N0) non-small cell lung cancer (adenocarcinoma)    SURGICAL HISTORY: Past Surgical History:  Procedure Laterality Date   ELECTROMAGNETIC NAVIGATION BROCHOSCOPY Right 02/02/2019   Procedure: VIDEO BRONCHOSCOPY WITH NAVIGATION AND FIDUCIAL PLACEMENT;  Surgeon: Garner Nash, DO;  Location: [facility];  Service: Cardiopulmonary;  Laterality: Right;   FUDUCIAL PLACEMENT Right 02/02/2019   Procedure: Placement Of Fuducial Right Upper Lobe;  Surgeon: Garner Nash, DO;  Location: [facility];  Service: Cardiopulmonary;  Laterality: Right;   IR IMAGING GUIDED PORT INSERTION  12/13/2020   none     VIDEO BRONCHOSCOPY WITH RADIAL ENDOBRONCHIAL ULTRASOUND Right 02/02/2019   Procedure: Video Bronchoscopy With Radial Endobronchial Ultrasound;  Surgeon: Garner Nash, DO;  Location: [facility];  Service: Cardiopulmonary;  Laterality: Right;    SOCIAL HISTORY: Social History   Socioeconomic History   Marital status: Single    Spouse name: Not on file   Number of children: 0   Years of education: Not on file   Highest education level: 9th grade  Occupational History   Not on file  Tobacco Use   Smoking status: Former    Packs/day: 1.00    Years:  48.00    Pack years: 48.00    Types: Cigarettes    Quit date: 02/20/2015    Years since quitting: 5.8   Smokeless tobacco: Former    Quit date: 10/2013  Vaping Use   Vaping Use: Never used  Substance and Sexual Activity   Alcohol use: No   Drug use: No   Sexual activity: Yes    Birth control/protection: Condom   Other Topics Concern   Not on file  Social History Narrative   Not on file   Social Determinants of Health   Financial Resource Strain: Not on file  Food Insecurity: Not on file  Transportation Needs: Not on file  Physical Activity: Not on file  Stress: Not on file  Social Connections: Not on file  Intimate Partner Violence: Not on file    FAMILY HISTORY: Family History  Problem Relation Age of Onset   Lung cancer Neg Hx     ALLERGIES:  has No Known Allergies.  MEDICATIONS:  Current Outpatient Medications  Medication Sig Dispense Refill   albuterol (VENTOLIN HFA) 108 (90 Base) MCG/ACT inhaler Inhale 2 puffs into the lungs every 6 (six) hours as needed for wheezing or shortness of breath. 18 g 6   amLODipine (NORVASC) 10 MG tablet Take 1 tablet (10 mg total) by mouth daily. 90 tablet 3   blood glucose meter kit and supplies KIT Dispense based on patient and insurance preference. Use up to four times daily as directed. 1 each 0   Blood Pressure Monitoring (BLOOD PRESSURE MONITOR/ARM) DEVI Check BP once a day. DX I 10 1 each 0   cetirizine (ZYRTEC) 10 MG tablet Take 1 tablet (10 mg total) by mouth daily. (Patient not taking: Reported on 12/03/2020) 30 tablet 11   folic acid (FOLVITE) 1 MG tablet Take 1 tablet (1 mg total) by mouth daily. (Patient not taking: Reported on 12/03/2020) 90 tablet 1   gabapentin (NEURONTIN) 300 MG capsule Take 1 capsule (300 mg total) by mouth at bedtime. (Patient not taking: Reported on 12/03/2020) 30 capsule 0   glipiZIDE (GLUCOTROL) 5 MG tablet TAKE 1 TABLET BY MOUTH TWICE DAILY WITH A MEAL 180 tablet 0   latanoprost (XALATAN) 0.005 % ophthalmic solution Place 1 drop into both eyes at bedtime.     levETIRAcetam (KEPPRA) 1000 MG tablet Take 1 tablet (1,000 mg total) by mouth 2 (two) times daily. 60 tablet 0   lidocaine-prilocaine (EMLA) cream Apply 1 application topically as needed. (Patient not taking: Reported on 12/03/2020) 30 g 0   lisinopril (ZESTRIL)  40 MG tablet Take 1 tablet (40 mg total) by mouth daily. 90 tablet 3   metFORMIN (GLUCOPHAGE) 500 MG tablet TAKE 1 TABLET BY MOUTH TWICE DAILY WITH MEALS 180 tablet 0   Multiple Vitamins-Minerals (MULTIVITAMIN MEN) TABS Take 1 tablet by mouth daily.     ondansetron (ZOFRAN) 8 MG tablet Take 1 tablet (8 mg total) by mouth every 8 (eight) hours as needed for nausea or vomiting. (Patient not taking: Reported on 12/03/2020) 30 tablet 0   pantoprazole (PROTONIX) 40 MG tablet Take 1 tablet (40 mg total) by mouth daily. (Patient not taking: No sig reported) 30 tablet 0   prochlorperazine (COMPAZINE) 10 MG tablet Take 1 tablet (10 mg total) by mouth every 6 (six) hours as needed for nausea or vomiting. (Patient not taking: Reported on 12/03/2020) 30 tablet 0   sitaGLIPtin (JANUVIA) 100 MG tablet Take 1 tablet (100 mg total) by mouth daily. 90 tablet 3  No current facility-administered medications for this visit.    REVIEW OF SYSTEMS:   Constitutional: ( - ) fevers, ( - )  chills , ( - ) night sweats Eyes: ( - ) blurriness of vision, ( - ) double vision, ( - ) watery eyes Ears, nose, mouth, throat, and face: ( - ) mucositis, ( - ) sore throat Respiratory: ( - ) cough, ( - ) dyspnea, ( - ) wheezes Cardiovascular: ( - ) palpitation, ( - ) chest discomfort, ( - ) lower extremity swelling Gastrointestinal:  ( - ) nausea, ( - ) heartburn, ( - ) change in bowel habits Skin: ( - ) abnormal skin rashes Lymphatics: ( - ) new lymphadenopathy, ( - ) easy bruising Neurological: ( - ) numbness, ( - ) tingling, ( - ) new weaknesses Behavioral/Psych: ( - ) mood change, ( - ) new changes  All other systems were reviewed with the patient and are negative.  PHYSICAL EXAMINATION:  Vitals:   12/14/20 1415  BP: 140/69  Pulse: 76  Resp: 19  Temp: 98 F (36.7 C)  SpO2: 99%   Filed Weights   12/14/20 1415  Weight: 159 lb (72.1 kg)    GENERAL: Well-appearing elderly African-American male, alert, no distress and  comfortable SKIN: skin color, texture, turgor are normal, no rashes or significant lesions EYES: conjunctiva are pink and non-injected, sclera clear LUNGS: clear to auscultation and percussion with normal breathing effort HEART: regular rate & rhythm and no murmurs and no lower extremity edema PSYCH: alert & oriented x 3, fluent speech NEURO: no focal motor/sensory deficits  LABORATORY DATA:  I have reviewed the data as listed CBC Latest Ref Rng & Units 12/17/2020 12/14/2020 11/30/2020  WBC 4.0 - 10.5 K/uL 4.9 5.7 3.6(L)  Hemoglobin 13.0 - 17.0 g/dL 12.2(L) 12.0(L) 11.8(L)  Hematocrit 39.0 - 52.0 % 38.0(L) 36.3(L) 35.8(L)  Platelets 150 - 400 K/uL 266 237 209    CMP Latest Ref Rng & Units 12/17/2020 12/14/2020 11/30/2020  Glucose 70 - 99 mg/dL 217(H) 124(H) 154(H)  BUN 8 - 23 mg/dL _0 Creatinine 0.61 - 1.24 mg/dL 0.79 0.71 0.80  Sodium 135 - 145 mmol/L 138 140 144  Potassium 3.5 - 5.1 mmol/L 3.9 3.9 4.0  Chloride 98 - 111 mmol/L 102 106 109  CO2 22 - 32 mmol/L _1 Calcium 8.9 - 10.3 mg/dL 9.1 9.1 9.1  Total Protein 6.5 - 8.1 g/dL - 6.3(L) 6.2(L)  Total Bilirubin 0.3 - 1.2 mg/dL - 0.4 0.3  Alkaline Phos 38 - 126 U/L - 78 72  AST 15 - 41 U/L - 14(L) 12(L)  ALT 0 - 44 U/L - 13 15    RADIOGRAPHIC STUDIES:  DG Hand Complete Left  Result Date: 12/17/2020 CLINICAL DATA:  Left hand swelling for few days, no reported injury EXAM: LEFT HAND - COMPLETE 3+ VIEW COMPARISON:  None. FINDINGS: No fracture or dislocation. No focal osseous lesions. 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 No radiopaque foreign bodies. Dorsal left hand soft tissue swelling. IMPRESSION: No acute osseous abnormality. Dorsal left hand soft tissue swelling. 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 Electronically Signed   By: Ilona Sorrel M.D.   On: 12/17/2020 14:52   IR IMAGING GUIDED PORT  INSERTION  Result Date: 12/13/2020 INDICATION: 75 year old male referred for port catheter EXAM: IMAGE GUIDED PORT CATHETER MEDICATIONS: None ANESTHESIA/SEDATION: Moderate (conscious) sedation was  employed during this procedure. A total of Versed 2.0 mg and Fentanyl 100 mcg was administered intravenously. Moderate Sedation Time: 15 minutes. The patient's level of consciousness and vital signs were monitored continuously by radiology nursing throughout the procedure under my direct supervision. FLUOROSCOPY TIME:  Fluoroscopy Time: 0 minutes 6 seconds (1 mGy). COMPLICATIONS: None PROCEDURE: The procedure, risks, benefits, and alternatives were explained to the patient. Questions regarding the procedure were encouraged and answered. The patient understands and consents to the procedure. Ultrasound survey was performed with images stored and sent to PACs. The right neck and chest was prepped with chlorhexidine, and draped in the usual sterile fashion using maximum barrier technique (cap and mask, sterile gown, sterile gloves, large sterile sheet, hand hygiene and cutaneous antiseptic). Local anesthesia was attained by infiltration with 1% lidocaine without epinephrine. Ultrasound demonstrated patency of the right internal jugular vein, and this was documented with an image. Under real-time ultrasound guidance, this vein was accessed with a 21 gauge micropuncture needle and image documentation was performed. A small dermatotomy was made at the access site with an 11 scalpel. A 0.018" wire was advanced into the SVC and used to estimate the length of the internal catheter. The access needle exchanged for a 72F micropuncture vascular sheath. The 0.018" wire was then removed and a 0.035" wire advanced into the IVC. An appropriate location for the subcutaneous reservoir was selected below the clavicle and an incision was made through the skin and underlying soft tissues. The subcutaneous tissues were then dissected using a  combination of blunt and sharp surgical technique and a pocket was formed. A single lumen power injectable portacatheter was then tunneled through the subcutaneous tissues from the pocket to the dermatotomy and the port reservoir placed within the subcutaneous pocket. The venous access site was then serially dilated and a peel away vascular sheath placed over the wire. The wire was removed and the port catheter advanced into position under fluoroscopic guidance. The catheter tip is positioned in the cavoatrial junction. This was documented with a spot image. The portacatheter was then tested and found to flush and aspirate well. The port was flushed with saline followed by 100 units/mL heparinized saline. The pocket was then closed in two layers using first subdermal inverted interrupted absorbable sutures followed by a running subcuticular suture. The epidermis was then sealed with Dermabond. The dermatotomy at the venous access site was also seal with Dermabond. Patient tolerated the procedure well and remained hemodynamically stable throughout. No complications encountered and no significant blood loss encountered IMPRESSION: Status post right IJ port placement. Signed, Dulcy Fanny. Dellia Nims, RPVI [facility] Radiology Electronically Signed   By: Corrie Mckusick D.O.   On: 12/13/2020 16:40    ASSESSMENT & PLAN Atley Florina Ou. 75 y.o. 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  After review the labs, reviewed records, discussion with the patient the findings are most consistent with metastatic adenocarcinoma with spread to the brain.  Unfortunately we do not have new pathology as the lesions in his brain were not resectable and he underwent radiation therapy instead.  We can presume based off his prior pathology that this represents the original adenocarcinoma.  As  such I do believe it is reasonable to proceed with carboplatin, pembrolizumab, pemetrexed therapy in order to help treat any possible micrometastatic disease.  The patient also underwent whole brain radiation therapy  instead of SRS.  Therefore I do believe systemic therapy will be reasonable.  This plan of care was discussed with the resident lung expert Dr. Julien Nordmann. Additionally our [place] 360 testing was negative for a targetable mutation.   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 Lost to follow up --presents with brain metastasis only. No evidence of disease in the C/A/P noted on CT scan from 10/16/2020.  --Patient completed whole brain radiation, but did not wish to undergo SRS.  Radiation oncology noted that this may be an option in the future if he were to have recurrence in the brain --Guardant 360 on 11/01/2020 showed no targetable mutations.  Plan: --plan to proceed with Carbo/Pem/Pem chemotherapy, Cycle 1 Day 1 today. --continue to follow with neuro-oncology for brain metastasis  #Supportive Care -- chemotherapy education complete  -- port placed --daily PO 63m folic acid with pemetrexed. -- zofran 81mq8H PRN and compazine 1050mO q6H for nausea -- EMLA cream for port -- no pain medication required at this time.   No orders of the defined types were placed in this encounter.  All questions were answered. The patient knows to call the clinic with any problems, questions or concerns.  A total of more than 30 minutes were spent on this encounter with face-to-face time and non-face-to-face time, including preparing to see the patient, ordering tests and/or medications, counseling the patient and coordination of care as outlined above.   JohLedell PeoplesD Department of Hematology/Oncology [facility] WesThe Surgicare Center Of Utahone: [phone]:  [phone]: [email or web address]  12/18/2020 10:13 AM

## 2020-12-18 NOTE — Progress Notes (Signed)
Left upper extremity venous duplex completed. Refer to "CV Proc" under chart review to view preliminary results.  12/18/2020 11:31 AM Kelby Aline., MHA, RVT, RDCS, RDMS

## 2020-12-18 NOTE — Telephone Encounter (Signed)
Scheduled appts per 8/16 sch msg. Called pt, no answer. Left msg with appts dates and times.

## 2020-12-19 ENCOUNTER — Telehealth: Payer: Self-pay | Admitting: *Deleted

## 2020-12-19 ENCOUNTER — Other Ambulatory Visit: Payer: Self-pay | Admitting: Emergency Medicine

## 2020-12-19 DIAGNOSIS — E1169 Type 2 diabetes mellitus with other specified complication: Secondary | ICD-10-CM | POA: Diagnosis not present

## 2020-12-19 DIAGNOSIS — I10 Essential (primary) hypertension: Secondary | ICD-10-CM | POA: Diagnosis not present

## 2020-12-19 DIAGNOSIS — E785 Hyperlipidemia, unspecified: Secondary | ICD-10-CM | POA: Diagnosis not present

## 2020-12-19 DIAGNOSIS — C7931 Secondary malignant neoplasm of brain: Secondary | ICD-10-CM | POA: Diagnosis not present

## 2020-12-19 DIAGNOSIS — H409 Unspecified glaucoma: Secondary | ICD-10-CM | POA: Diagnosis not present

## 2020-12-19 DIAGNOSIS — C3481 Malignant neoplasm of overlapping sites of right bronchus and lung: Secondary | ICD-10-CM | POA: Diagnosis not present

## 2020-12-26 ENCOUNTER — Other Ambulatory Visit: Payer: Self-pay

## 2020-12-26 ENCOUNTER — Ambulatory Visit (INDEPENDENT_AMBULATORY_CARE_PROVIDER_SITE_OTHER): Payer: Medicare Other | Admitting: Emergency Medicine

## 2020-12-26 ENCOUNTER — Encounter: Payer: Self-pay | Admitting: Emergency Medicine

## 2020-12-26 VITALS — BP 132/76 | HR 83 | Temp 98.6°F | Ht 66.0 in | Wt 153.0 lb

## 2020-12-26 DIAGNOSIS — G4089 Other seizures: Secondary | ICD-10-CM | POA: Diagnosis not present

## 2020-12-26 DIAGNOSIS — E785 Hyperlipidemia, unspecified: Secondary | ICD-10-CM | POA: Diagnosis not present

## 2020-12-26 DIAGNOSIS — E1169 Type 2 diabetes mellitus with other specified complication: Secondary | ICD-10-CM

## 2020-12-26 DIAGNOSIS — I700 Atherosclerosis of aorta: Secondary | ICD-10-CM | POA: Diagnosis not present

## 2020-12-26 DIAGNOSIS — Z515 Encounter for palliative care: Secondary | ICD-10-CM | POA: Diagnosis not present

## 2020-12-26 DIAGNOSIS — C3490 Malignant neoplasm of unspecified part of unspecified bronchus or lung: Secondary | ICD-10-CM

## 2020-12-26 DIAGNOSIS — E1159 Type 2 diabetes mellitus with other circulatory complications: Secondary | ICD-10-CM

## 2020-12-26 DIAGNOSIS — C7931 Secondary malignant neoplasm of brain: Secondary | ICD-10-CM | POA: Diagnosis not present

## 2020-12-26 DIAGNOSIS — I152 Hypertension secondary to endocrine disorders: Secondary | ICD-10-CM | POA: Diagnosis not present

## 2020-12-26 NOTE — Patient Instructions (Signed)
Decrease amlodipine to 5 mg daily. Hypertension, Adult High blood pressure (hypertension) is when the force of blood pumping through the arteries is too strong. The arteries are the blood vessels that carry blood from the heart throughout the body. Hypertension forces the heart to work harder to pump blood and may cause arteries to become narrow or stiff. Untreated or uncontrolled hypertension can cause a heart attack, heart failure, a stroke, kidney disease, and otherproblems. A blood pressure reading consists of a higher number over a lower number. Ideally, your blood pressure should be below 120/80. The first ("top") number is called the systolic pressure. It is a measure of the pressure in your arteries as your heart beats. The second ("bottom") number is called the diastolic pressure. It is a measure of the pressure in your arteries as theheart relaxes. What are the causes? The exact cause of this condition is not known. There are some conditions thatresult in or are related to high blood pressure. What increases the risk? Some risk factors for high blood pressure are under your control. The following factors may make you more likely to develop this condition: Smoking. Having type 2 diabetes mellitus, high cholesterol, or both. Not getting enough exercise or physical activity. Being overweight. Having too much fat, sugar, calories, or salt (sodium) in your diet. Drinking too much alcohol. Some risk factors for high blood pressure may be difficult or impossible to change. Some of these factors include: Having chronic kidney disease. Having a family history of high blood pressure. Age. Risk increases with age. Race. You may be at higher risk if you are African American. Gender. Men are at higher risk than women before age 86. After age 56, women are at higher risk than men. Having obstructive sleep apnea. Stress. What are the signs or symptoms? High blood pressure may not cause symptoms.  Very high blood pressure (hypertensive crisis) may cause: Headache. Anxiety. Shortness of breath. Nosebleed. Nausea and vomiting. Vision changes. Severe chest pain. Seizures. How is this diagnosed? This condition is diagnosed by measuring your blood pressure while you are seated, with your arm resting on a flat surface, your legs uncrossed, and your feet flat on the floor. The cuff of the blood pressure monitor will be placed directly against the skin of your upper arm at the level of your heart. It should be measured at least twice using the same arm. Certain conditions cancause a difference in blood pressure between your right and left arms. Certain factors can cause blood pressure readings to be lower or higher than normal for a short period of time: When your blood pressure is higher when you are in a health care provider's office than when you are at home, this is called white coat hypertension. Most people with this condition do not need medicines. When your blood pressure is higher at home than when you are in a health care provider's office, this is called masked hypertension. Most people with this condition may need medicines to control blood pressure. If you have a high blood pressure reading during one visit or you have normal blood pressure with other risk factors, you may be asked to: Return on a different day to have your blood pressure checked again. Monitor your blood pressure at home for 1 week or longer. If you are diagnosed with hypertension, you may have other blood or imaging tests to help your health care provider understand your overall risk for otherconditions. How is this treated? This condition is treated by  making healthy lifestyle changes, such as eating healthy foods, exercising more, and reducing your alcohol intake. Your health care provider may prescribe medicine if lifestyle changes are not enough to get your blood pressure under control, and if: Your systolic  blood pressure is above 130. Your diastolic blood pressure is above 80. Your personal target blood pressure may vary depending on your medicalconditions, your age, and other factors. Follow these instructions at home: Eating and drinking  Eat a diet that is high in fiber and potassium, and low in sodium, added sugar, and fat. An example eating plan is called the DASH (Dietary Approaches to Stop Hypertension) diet. To eat this way: Eat plenty of fresh fruits and vegetables. Try to fill one half of your plate at each meal with fruits and vegetables. Eat whole grains, such as whole-wheat pasta, brown rice, or whole-grain bread. Fill about one fourth of your plate with whole grains. Eat or drink low-fat dairy products, such as skim milk or low-fat yogurt. Avoid fatty cuts of meat, processed or cured meats, and poultry with skin. Fill about one fourth of your plate with lean proteins, such as fish, chicken without skin, beans, eggs, or tofu. Avoid pre-made and processed foods. These tend to be higher in sodium, added sugar, and fat. Reduce your daily sodium intake. Most people with hypertension should eat less than 1,500 mg of sodium a day. Do not drink alcohol if: Your health care provider tells you not to drink. You are pregnant, may be pregnant, or are planning to become pregnant. If you drink alcohol: Limit how much you use to: 0-1 drink a day for women. 0-2 drinks a day for men. Be aware of how much alcohol is in your drink. In the [place] one drink equals one 12 oz bottle of beer (355 mL), one 5 oz glass of wine (148 mL), or one 1 oz glass of hard liquor (44 mL).  Lifestyle  Work with your health care provider to maintain a healthy body weight or to lose weight. Ask what an ideal weight is for you. Get at least 30 minutes of exercise most days of the week. Activities may include walking, swimming, or biking. Include exercise to strengthen your muscles (resistance exercise), such as Pilates  or lifting weights, as part of your weekly exercise routine. Try to do these types of exercises for 30 minutes at least 3 days a week. Do not use any products that contain nicotine or tobacco, such as cigarettes, e-cigarettes, and chewing tobacco. If you need help quitting, ask your health care provider. Monitor your blood pressure at home as told by your health care provider. Keep all follow-up visits as told by your health care provider. This is important.  Medicines Take over-the-counter and prescription medicines only as told by your health care provider. Follow directions carefully. Blood pressure medicines must be taken as prescribed. Do not skip doses of blood pressure medicine. Doing this puts you at risk for problems and can make the medicine less effective. Ask your health care provider about side effects or reactions to medicines that you should watch for. Contact a health care provider if you: Think you are having a reaction to a medicine you are taking. Have headaches that keep coming back (recurring). Feel dizzy. Have swelling in your ankles. Have trouble with your vision. Get help right away if you: Develop a severe headache or confusion. Have unusual weakness or numbness. Feel faint. Have severe pain in your chest or abdomen. Vomit repeatedly.  Have trouble breathing. Summary Hypertension is when the force of blood pumping through your arteries is too strong. If this condition is not controlled, it may put you at risk for serious complications. Your personal target blood pressure may vary depending on your medical conditions, your age, and other factors. For most people, a normal blood pressure is less than 120/80. Hypertension is treated with lifestyle changes, medicines, or a combination of both. Lifestyle changes include losing weight, eating a healthy, low-sodium diet, exercising more, and limiting alcohol. This information is not intended to replace advice given to you  by your health care provider. Make sure you discuss any questions you have with your healthcare provider. Document Revised: 12/30/2017 Document Reviewed: 12/30/2017 Elsevier Patient Education  [place].

## 2020-12-26 NOTE — Assessment & Plan Note (Signed)
Frequent episodes of low blood pressure at home. Will decrease amlodipine to 5 mg daily and continue lisinopril 40 mg daily. Continue monitoring blood pressure readings at home and keep a log.

## 2020-12-26 NOTE — Progress Notes (Signed)
Benjamin Willis. 75 y.o.   Chief Complaint  Patient presents with   Hypertension    Pt concerns of high and low BP readings    HISTORY OF PRESENT ILLNESS: This is a 75 y.o. male with history of metastatic lung cancer. Has a history of hypertension on amlodipine 10 mg and lisinopril 40 mg daily. For the last 10 days he has had multiple episodes of low normal blood pressure readings. Also complaining of general weakness.  Drinking and eating well.  Having 3 meals a day. No other complaints or medical concerns today. BP Readings from Last 3 Encounters:  12/26/20 132/76  12/17/20 (!) 142/81  12/14/20 140/69    Hypertension Pertinent negatives include no chest pain, headaches, palpitations or shortness of breath.    Prior to Admission medications   Medication Sig Start Date End Date Taking? Authorizing Provider  albuterol (VENTOLIN HFA) 108 (90 Base) MCG/ACT inhaler Inhale 2 puffs into the lungs every 6 (six) hours as needed for wheezing or shortness of breath. 01/19/19  Yes Icard, Bradley L, DO  amLODipine (NORVASC) 10 MG tablet Take 1 tablet (10 mg total) by mouth daily. 12/06/19  Yes Rasheen Bells, Ines Bloomer, MD  blood glucose meter kit and supplies KIT Dispense based on patient and insurance preference. Use up to four times daily as directed. 10/23/20  Yes Elodia Florence., MD  Blood Pressure Monitoring (BLOOD PRESSURE MONITOR/ARM) DEVI Check BP once a day. DX I 10 04/18/19  Yes Jessica Checketts, Ines Bloomer, MD  cetirizine (ZYRTEC) 10 MG tablet Take 1 tablet (10 mg total) by mouth daily. 09/03/17  Yes Jaynee Eagles, PA-C  folic acid (FOLVITE) 1 MG tablet Take 1 tablet (1 mg total) by mouth daily. 11/30/20  Yes Orson Slick, MD  gabapentin (NEURONTIN) 300 MG capsule Take 1 capsule (300 mg total) by mouth at bedtime. 12/19/20 03/19/21 Yes Seneca Hoback, Ines Bloomer, MD  glipiZIDE (GLUCOTROL) 5 MG tablet TAKE 1 TABLET BY MOUTH TWICE DAILY WITH A MEAL 10/08/20  Yes Deyana Wnuk, [place], MD   latanoprost (XALATAN) 0.005 % ophthalmic solution Place 1 drop into both eyes at bedtime. 01/24/20  Yes [provider]  levETIRAcetam (KEPPRA) 1000 MG tablet Take 1 tablet (1,000 mg total) by mouth 2 (two) times daily. 12/19/20 03/19/21 Yes Jocie Meroney, Ines Bloomer, MD  lidocaine-prilocaine (EMLA) cream Apply 1 application topically as needed. 11/30/20  Yes Orson Slick, MD  metFORMIN (GLUCOPHAGE) 500 MG tablet TAKE 1 TABLET BY MOUTH TWICE DAILY WITH MEALS 11/22/20  Yes Brayan Votaw, Ines Bloomer, MD  Multiple Vitamins-Minerals (MULTIVITAMIN MEN) TABS Take 1 tablet by mouth daily.   Yes [provider]  ondansetron (ZOFRAN) 8 MG tablet Take 1 tablet (8 mg total) by mouth every 8 (eight) hours as needed for nausea or vomiting. 11/30/20  Yes Orson Slick, MD  pantoprazole (PROTONIX) 40 MG tablet Take 1 tablet by mouth once daily 12/19/20  Yes Marijke Guadiana, Ines Bloomer, MD  prochlorperazine (COMPAZINE) 10 MG tablet Take 1 tablet (10 mg total) by mouth every 6 (six) hours as needed for nausea or vomiting. 11/30/20  Yes Orson Slick, MD  lisinopril (ZESTRIL) 40 MG tablet Take 1 tablet (40 mg total) by mouth daily. 12/06/19 12/13/20  Horald Pollen, MD  sitaGLIPtin (JANUVIA) 100 MG tablet Take 1 tablet (100 mg total) by mouth daily. 05/31/20 12/13/20  Horald Pollen, MD    No Known Allergies  Patient Active Problem List   Diagnosis Date Noted  Port-A-Cath in place 12/14/2020   Brain metastases [facility])    Palliative care by specialist    Metastatic lung cancer (metastasis from lung to other site) [facility]) 10/16/2020   Diabetes mellitus type 2 in nonobese ([place]) 10/16/2020   Glaucoma 10/16/2020   Lung cancer metastatic to brain ([place]) 10/16/2020   Atherosclerosis of aorta ([place]) 05/31/2020   Primary cancer of right upper lobe of lung ([place]) 02/07/2019   Lung nodule 02/02/2019   Hypertension associated with type 2 diabetes mellitus ([place]) 05/24/2018   Dyslipidemia 05/24/2018    Dyslipidemia associated with type 2 diabetes mellitus ([place]) 06/09/2017    Past Medical History:  Diagnosis Date   Allergy    Diabetes mellitus without complication ([place])    Type II   Glaucoma 10/16/2020   History of radiation therapy    [facility]- SBRT 02/21/19-02/28/19, Rt Lung- IMRT 02/29/20-04/15/20 Dr. Gery Pray   History of radiation therapy 10/30/2020   whole brain 10/17/2020-10/30/2020 Dr Gery Pray   Hyperlipidemia    Hypertension    Lung cancer ([place])    Stage IA2 (cT1b, N0) non-small cell lung cancer (adenocarcinoma)    Past Surgical History:  Procedure Laterality Date   ELECTROMAGNETIC NAVIGATION BROCHOSCOPY Right 02/02/2019   Procedure: VIDEO BRONCHOSCOPY WITH NAVIGATION AND FIDUCIAL PLACEMENT;  Surgeon: Garner Nash, DO;  Location: [place];  Service: Cardiopulmonary;  Laterality: Right;   FUDUCIAL PLACEMENT Right 02/02/2019   Procedure: Placement Of Fuducial Right Upper Lobe;  Surgeon: Garner Nash, DO;  Location: [place];  Service: Cardiopulmonary;  Laterality: Right;   IR IMAGING GUIDED PORT INSERTION  12/13/2020   none     VIDEO BRONCHOSCOPY WITH RADIAL ENDOBRONCHIAL ULTRASOUND Right 02/02/2019   Procedure: Video Bronchoscopy With Radial Endobronchial Ultrasound;  Surgeon: Garner Nash, DO;  Location: [place];  Service: Cardiopulmonary;  Laterality: Right;    Social History   Socioeconomic History   Marital status: Single    Spouse name: Not on file   Number of children: 0   Years of education: Not on file   Highest education level: 9th grade  Occupational History   Not on file  Tobacco Use   Smoking status: Former    Packs/day: 1.00    Years: 48.00    Pack years: 48.00    Types: Cigarettes    Quit date: 02/20/2015    Years since quitting: 5.8   Smokeless tobacco: Former    Quit date: 10/2013  Vaping Use   Vaping Use: Never used  Substance and Sexual Activity   Alcohol use: No   Drug use: No   Sexual activity: Yes    Birth control/protection:  Condom  Other Topics Concern   Not on file  Social History Narrative   Not on file   Social Determinants of Health   Financial Resource Strain: Not on file  Food Insecurity: Not on file  Transportation Needs: Not on file  Physical Activity: Not on file  Stress: Not on file  Social Connections: Not on file  Intimate Partner Violence: Not on file    Family History  Problem Relation Age of Onset   Lung cancer Neg Hx      Review of Systems  Constitutional: Negative.  Negative for chills and fever.  HENT: Negative.  Negative for congestion and sore throat.   Respiratory: Negative.  Negative for cough and shortness of breath.   Cardiovascular:  Negative for chest pain and palpitations.  Gastrointestinal:  Negative for abdominal pain, diarrhea, nausea and vomiting.  Genitourinary:  Negative for dysuria.  Skin: Negative.  Negative for rash.  Neurological:  Negative for dizziness and headaches.  All other systems reviewed and are negative.  Today's Vitals   12/26/20 1417  BP: 132/76  Pulse: 83  Temp: 98.6 F (37 C)  TempSrc: Oral  SpO2: 95%  Weight: 153 lb (69.4 kg)  Height: $Remove'5\' 6"'rLfNjIA$  (1.676 m)   Body mass index is 24.69 kg/m.  Physical Exam Vitals reviewed.  Constitutional:      Appearance: Normal appearance.  HENT:     Head: Normocephalic.  Eyes:     Extraocular Movements: Extraocular movements intact.     Pupils: Pupils are equal, round, and reactive to light.  Cardiovascular:     Rate and Rhythm: Normal rate and regular rhythm.     Pulses: Normal pulses.     Heart sounds: Normal heart sounds.  Pulmonary:     Effort: Pulmonary effort is normal.     Breath sounds: Normal breath sounds.  Musculoskeletal:     Cervical back: No tenderness.  Skin:    General: Skin is warm and dry.     Capillary Refill: Capillary refill takes less than 2 seconds.  Neurological:     General: No focal deficit present.     Mental Status: He is alert and oriented to person, place,  and time.  Psychiatric:        Mood and Affect: Mood normal.        Behavior: Behavior normal.     ASSESSMENT & PLAN: Hypertension associated with type 2 diabetes mellitus (HCC) Frequent episodes of low blood pressure at home. Will decrease amlodipine to 5 mg daily and continue lisinopril 40 mg daily. Continue monitoring blood pressure readings at home and keep a log.  Torryn was seen today for hypertension.  Diagnoses and all orders for this visit:  Hypertension associated with type 2 diabetes mellitus ([place])  Primary malignant neoplasm of lung metastatic to other site, unspecified laterality ([place])  Dyslipidemia associated with type 2 diabetes mellitus ([place])  Atherosclerosis of aorta ([place])  Patient Instructions  Decrease amlodipine to 5 mg daily. Hypertension, Adult High blood pressure (hypertension) is when the force of blood pumping through the arteries is too strong. The arteries are the blood vessels that carry blood from the heart throughout the body. Hypertension forces the heart to work harder to pump blood and may cause arteries to become narrow or stiff. Untreated or uncontrolled hypertension can cause a heart attack, heart failure, a stroke, kidney disease, and otherproblems. A blood pressure reading consists of a higher number over a lower number. Ideally, your blood pressure should be below 120/80. The first ("top") number is called the systolic pressure. It is a measure of the pressure in your arteries as your heart beats. The second ("bottom") number is called the diastolic pressure. It is a measure of the pressure in your arteries as theheart relaxes. What are the causes? The exact cause of this condition is not known. There are some conditions thatresult in or are related to high blood pressure. What increases the risk? Some risk factors for high blood pressure are under your control. The following factors may make you more likely to develop this  condition: Smoking. Having type 2 diabetes mellitus, high cholesterol, or both. Not getting enough exercise or physical activity. Being overweight. Having too much fat, sugar, calories, or salt (sodium) in your diet. Drinking too much alcohol. Some risk factors for high blood pressure may be difficult or  impossible to change. Some of these factors include: Having chronic kidney disease. Having a family history of high blood pressure. Age. Risk increases with age. Race. You may be at higher risk if you are African American. Gender. Men are at higher risk than women before age 76. After age 7, women are at higher risk than men. Having obstructive sleep apnea. Stress. What are the signs or symptoms? High blood pressure may not cause symptoms. Very high blood pressure (hypertensive crisis) may cause: Headache. Anxiety. Shortness of breath. Nosebleed. Nausea and vomiting. Vision changes. Severe chest pain. Seizures. How is this diagnosed? This condition is diagnosed by measuring your blood pressure while you are seated, with your arm resting on a flat surface, your legs uncrossed, and your feet flat on the floor. The cuff of the blood pressure monitor will be placed directly against the skin of your upper arm at the level of your heart. It should be measured at least twice using the same arm. Certain conditions cancause a difference in blood pressure between your right and left arms. Certain factors can cause blood pressure readings to be lower or higher than normal for a short period of time: When your blood pressure is higher when you are in a health care provider's office than when you are at home, this is called white coat hypertension. Most people with this condition do not need medicines. When your blood pressure is higher at home than when you are in a health care provider's office, this is called masked hypertension. Most people with this condition may need medicines to control blood  pressure. If you have a high blood pressure reading during one visit or you have normal blood pressure with other risk factors, you may be asked to: Return on a different day to have your blood pressure checked again. Monitor your blood pressure at home for 1 week or longer. If you are diagnosed with hypertension, you may have other blood or imaging tests to help your health care provider understand your overall risk for otherconditions. How is this treated? This condition is treated by making healthy lifestyle changes, such as eating healthy foods, exercising more, and reducing your alcohol intake. Your health care provider may prescribe medicine if lifestyle changes are not enough to get your blood pressure under control, and if: Your systolic blood pressure is above 130. Your diastolic blood pressure is above 80. Your personal target blood pressure may vary depending on your medicalconditions, your age, and other factors. Follow these instructions at home: Eating and drinking  Eat a diet that is high in fiber and potassium, and low in sodium, added sugar, and fat. An example eating plan is called the DASH (Dietary Approaches to Stop Hypertension) diet. To eat this way: Eat plenty of fresh fruits and vegetables. Try to fill one half of your plate at each meal with fruits and vegetables. Eat whole grains, such as whole-wheat pasta, brown rice, or whole-grain bread. Fill about one fourth of your plate with whole grains. Eat or drink low-fat dairy products, such as skim milk or low-fat yogurt. Avoid fatty cuts of meat, processed or cured meats, and poultry with skin. Fill about one fourth of your plate with lean proteins, such as fish, chicken without skin, beans, eggs, or tofu. Avoid pre-made and processed foods. These tend to be higher in sodium, added sugar, and fat. Reduce your daily sodium intake. Most people with hypertension should eat less than 1,500 mg of sodium a day. Do not drink  alcohol if: Your health care provider tells you not to drink. You are pregnant, may be pregnant, or are planning to become pregnant. If you drink alcohol: Limit how much you use to: 0-1 drink a day for women. 0-2 drinks a day for men. Be aware of how much alcohol is in your drink. In the [place] one drink equals one 12 oz bottle of beer (355 mL), one 5 oz glass of wine (148 mL), or one 1 oz glass of hard liquor (44 mL).  Lifestyle  Work with your health care provider to maintain a healthy body weight or to lose weight. Ask what an ideal weight is for you. Get at least 30 minutes of exercise most days of the week. Activities may include walking, swimming, or biking. Include exercise to strengthen your muscles (resistance exercise), such as Pilates or lifting weights, as part of your weekly exercise routine. Try to do these types of exercises for 30 minutes at least 3 days a week. Do not use any products that contain nicotine or tobacco, such as cigarettes, e-cigarettes, and chewing tobacco. If you need help quitting, ask your health care provider. Monitor your blood pressure at home as told by your health care provider. Keep all follow-up visits as told by your health care provider. This is important.  Medicines Take over-the-counter and prescription medicines only as told by your health care provider. Follow directions carefully. Blood pressure medicines must be taken as prescribed. Do not skip doses of blood pressure medicine. Doing this puts you at risk for problems and can make the medicine less effective. Ask your health care provider about side effects or reactions to medicines that you should watch for. Contact a health care provider if you: Think you are having a reaction to a medicine you are taking. Have headaches that keep coming back (recurring). Feel dizzy. Have swelling in your ankles. Have trouble with your vision. Get help right away if you: Develop a severe headache or  confusion. Have unusual weakness or numbness. Feel faint. Have severe pain in your chest or abdomen. Vomit repeatedly. Have trouble breathing. Summary Hypertension is when the force of blood pumping through your arteries is too strong. If this condition is not controlled, it may put you at risk for serious complications. Your personal target blood pressure may vary depending on your medical conditions, your age, and other factors. For most people, a normal blood pressure is less than 120/80. Hypertension is treated with lifestyle changes, medicines, or a combination of both. Lifestyle changes include losing weight, eating a healthy, low-sodium diet, exercising more, and limiting alcohol. This information is not intended to replace advice given to you by your health care provider. Make sure you discuss any questions you have with your healthcare provider. Document Revised: 12/30/2017 Document Reviewed: 12/30/2017 Elsevier Patient Education  2022 [place], MD [facility]

## 2021-01-01 ENCOUNTER — Telehealth: Payer: Self-pay | Admitting: Emergency Medicine

## 2021-01-01 NOTE — Telephone Encounter (Signed)
LVM for pt to rtn my call to [phone] to schedule AWV with [facility]

## 2021-01-04 ENCOUNTER — Inpatient Hospital Stay: Payer: Medicare Other | Attending: Hematology and Oncology

## 2021-01-04 ENCOUNTER — Other Ambulatory Visit: Payer: Self-pay

## 2021-01-04 ENCOUNTER — Other Ambulatory Visit: Payer: Self-pay | Admitting: Hematology and Oncology

## 2021-01-04 ENCOUNTER — Encounter: Payer: Self-pay | Admitting: Hematology and Oncology

## 2021-01-04 ENCOUNTER — Inpatient Hospital Stay (HOSPITAL_BASED_OUTPATIENT_CLINIC_OR_DEPARTMENT_OTHER): Payer: Medicare Other | Admitting: Hematology and Oncology

## 2021-01-04 ENCOUNTER — Inpatient Hospital Stay: Payer: Medicare Other

## 2021-01-04 VITALS — BP 133/75 | HR 75 | Temp 97.0°F | Resp 17 | Wt 153.0 lb

## 2021-01-04 DIAGNOSIS — C7931 Secondary malignant neoplasm of brain: Secondary | ICD-10-CM | POA: Diagnosis not present

## 2021-01-04 DIAGNOSIS — Z95828 Presence of other vascular implants and grafts: Secondary | ICD-10-CM

## 2021-01-04 DIAGNOSIS — A419 Sepsis, unspecified organism: Secondary | ICD-10-CM | POA: Diagnosis not present

## 2021-01-04 DIAGNOSIS — C3411 Malignant neoplasm of upper lobe, right bronchus or lung: Secondary | ICD-10-CM | POA: Diagnosis not present

## 2021-01-04 DIAGNOSIS — D638 Anemia in other chronic diseases classified elsewhere: Secondary | ICD-10-CM | POA: Diagnosis not present

## 2021-01-04 DIAGNOSIS — R531 Weakness: Secondary | ICD-10-CM | POA: Diagnosis not present

## 2021-01-04 DIAGNOSIS — C3490 Malignant neoplasm of unspecified part of unspecified bronchus or lung: Secondary | ICD-10-CM

## 2021-01-04 DIAGNOSIS — D6481 Anemia due to antineoplastic chemotherapy: Secondary | ICD-10-CM | POA: Insufficient documentation

## 2021-01-04 DIAGNOSIS — Z5111 Encounter for antineoplastic chemotherapy: Secondary | ICD-10-CM | POA: Insufficient documentation

## 2021-01-04 DIAGNOSIS — C3491 Malignant neoplasm of unspecified part of right bronchus or lung: Secondary | ICD-10-CM | POA: Insufficient documentation

## 2021-01-04 DIAGNOSIS — C7802 Secondary malignant neoplasm of left lung: Secondary | ICD-10-CM | POA: Diagnosis not present

## 2021-01-04 DIAGNOSIS — Z20822 Contact with and (suspected) exposure to covid-19: Secondary | ICD-10-CM | POA: Diagnosis not present

## 2021-01-04 DIAGNOSIS — N390 Urinary tract infection, site not specified: Secondary | ICD-10-CM | POA: Diagnosis not present

## 2021-01-04 DIAGNOSIS — R000 Tachycardia, unspecified: Secondary | ICD-10-CM | POA: Diagnosis not present

## 2021-01-04 DIAGNOSIS — Z79899 Other long term (current) drug therapy: Secondary | ICD-10-CM | POA: Insufficient documentation

## 2021-01-04 DIAGNOSIS — Z5112 Encounter for antineoplastic immunotherapy: Secondary | ICD-10-CM | POA: Insufficient documentation

## 2021-01-04 DIAGNOSIS — D701 Agranulocytosis secondary to cancer chemotherapy: Secondary | ICD-10-CM | POA: Insufficient documentation

## 2021-01-04 DIAGNOSIS — E032 Hypothyroidism due to medicaments and other exogenous substances: Secondary | ICD-10-CM

## 2021-01-04 LAB — CMP (CANCER CENTER ONLY)
ALT: 24 U/L (ref 0–44)
AST: 21 U/L (ref 15–41)
Albumin: 3.8 g/dL (ref 3.5–5.0)
Alkaline Phosphatase: 74 U/L (ref 38–126)
Anion gap: 10 (ref 5–15)
BUN: 11 mg/dL (ref 8–23)
CO2: 26 mmol/L (ref 22–32)
Calcium: 9.4 mg/dL (ref 8.9–10.3)
Chloride: 110 mmol/L (ref 98–111)
Creatinine: 0.7 mg/dL (ref 0.61–1.24)
GFR, Estimated: >60 mL/min (ref >60)
Glucose, Bld: 78 mg/dL (ref 70–99)
Potassium: 3.9 mmol/L (ref 3.5–5.1)
Sodium: 146 mmol/L — ABNORMAL HIGH (ref 135–145)
Total Bilirubin: 0.5 mg/dL (ref 0.3–1.2)
Total Protein: 6.4 g/dL — ABNORMAL LOW (ref 6.5–8.1)

## 2021-01-04 LAB — CBC WITH DIFFERENTIAL (CANCER CENTER ONLY)
Abs Immature Granulocytes: 0.01 10*3/uL (ref 0.00–0.07)
Basophils Absolute: 0 10*3/uL (ref 0.0–0.1)
Basophils Relative: 0 %
Eosinophils Absolute: 0 10*3/uL (ref 0.0–0.5)
Eosinophils Relative: 1 %
HCT: 32.8 % — ABNORMAL LOW (ref 39.0–52.0)
Hemoglobin: 11.1 g/dL — ABNORMAL LOW (ref 13.0–17.0)
Immature Granulocytes: 0 %
Lymphocytes Relative: 20 %
Lymphs Abs: 0.6 10*3/uL — ABNORMAL LOW (ref 0.7–4.0)
MCH: 31.4 pg (ref 26.0–34.0)
MCHC: 33.8 g/dL (ref 30.0–36.0)
MCV: 92.9 fL (ref 80.0–100.0)
Monocytes Absolute: 0.5 10*3/uL (ref 0.1–1.0)
Monocytes Relative: 15 %
Neutro Abs: 2 10*3/uL (ref 1.7–7.7)
Neutrophils Relative %: 64 %
Platelet Count: 304 10*3/uL (ref 150–400)
RBC: 3.53 MIL/uL — ABNORMAL LOW (ref 4.22–5.81)
RDW: 13.3 % (ref 11.5–15.5)
WBC Count: 3.1 10*3/uL — ABNORMAL LOW (ref 4.0–10.5)
nRBC: 0 % (ref 0.0–0.2)

## 2021-01-04 MED ORDER — DEXAMETHASONE SODIUM PHOSPHATE 100 MG/10ML IJ SOLN
10.0000 mg | Freq: Once | INTRAMUSCULAR | Status: AC
  Administered 2021-01-04: 10 mg via INTRAVENOUS
  Filled 2021-01-04: qty 10

## 2021-01-04 MED ORDER — HEPARIN SOD (PORK) LOCK FLUSH 100 UNIT/ML IV SOLN
500.0000 [IU] | Freq: Once | INTRAVENOUS | Status: AC | PRN
  Administered 2021-01-04: 500 [IU]

## 2021-01-04 MED ORDER — SODIUM CHLORIDE 0.9 % IV SOLN
500.0000 mg/m2 | Freq: Once | INTRAVENOUS | Status: AC
  Administered 2021-01-04: 900 mg via INTRAVENOUS
  Filled 2021-01-04: qty 20

## 2021-01-04 MED ORDER — SODIUM CHLORIDE 0.9 % IV SOLN
150.0000 mg | Freq: Once | INTRAVENOUS | Status: AC
  Administered 2021-01-04: 150 mg via INTRAVENOUS
  Filled 2021-01-04: qty 150

## 2021-01-04 MED ORDER — SODIUM CHLORIDE 0.9 % IV SOLN
458.0000 mg | Freq: Once | INTRAVENOUS | Status: AC
  Administered 2021-01-04: 460 mg via INTRAVENOUS
  Filled 2021-01-04: qty 46

## 2021-01-04 MED ORDER — SODIUM CHLORIDE 0.9 % IV SOLN
Freq: Once | INTRAVENOUS | Status: AC
Start: 2021-01-04 — End: 2021-01-04

## 2021-01-04 MED ORDER — SODIUM CHLORIDE 0.9% FLUSH
10.0000 mL | INTRAVENOUS | Status: DC | PRN
  Administered 2021-01-04: 10 mL

## 2021-01-04 MED ORDER — PALONOSETRON HCL INJECTION 0.25 MG/5ML
0.2500 mg | Freq: Once | INTRAVENOUS | Status: AC
  Administered 2021-01-04: 0.25 mg via INTRAVENOUS
  Filled 2021-01-04: qty 5

## 2021-01-04 MED ORDER — SODIUM CHLORIDE 0.9% FLUSH
10.0000 mL | Freq: Once | INTRAVENOUS | Status: AC
  Administered 2021-01-04: 10 mL

## 2021-01-04 MED ORDER — PEMBROLIZUMAB CHEMO INJECTION 100 MG/4ML
200.0000 mg | Freq: Once | INTRAVENOUS | Status: AC
Start: 2021-01-04 — End: 2021-01-04
  Administered 2021-01-04: 200 mg via INTRAVENOUS
  Filled 2021-01-04: qty 8

## 2021-01-04 NOTE — Patient Instructions (Signed)
[facility]   Discharge Instructions: Thank you for choosing [facility] to provide your oncology and hematology care.   If you have a lab appointment with the [facility], please go directly to the [facility] and check in at the registration area.   Wear comfortable clothing and clothing appropriate for easy access to any Portacath or PICC line.   We strive to give you quality time with your provider. You may need to reschedule your appointment if you arrive late (15 or more minutes).  Arriving late affects you and other patients whose appointments are after yours.  Also, if you miss three or more appointments without notifying the office, you may be dismissed from the clinic at the provider's discretion.      For prescription refill requests, have your pharmacy contact our office and allow 72 hours for refills to be completed.    Today you received the following chemotherapy and/or immunotherapy agents: Pembrolizumab (Keytruda), Pemtrexed (Alimta), and Carboplatin      To help prevent nausea and vomiting after your treatment, we encourage you to take your nausea medication as directed.  BELOW ARE SYMPTOMS THAT SHOULD BE REPORTED IMMEDIATELY: *FEVER GREATER THAN 100.4 F (38 C) OR HIGHER *CHILLS OR SWEATING *NAUSEA AND VOMITING THAT IS NOT CONTROLLED WITH YOUR NAUSEA MEDICATION *UNUSUAL SHORTNESS OF BREATH *UNUSUAL BRUISING OR BLEEDING *URINARY PROBLEMS (pain or burning when urinating, or frequent urination) *BOWEL PROBLEMS (unusual diarrhea, constipation, pain near the anus) TENDERNESS IN MOUTH AND THROAT WITH OR WITHOUT PRESENCE OF ULCERS (sore throat, sores in mouth, or a toothache) UNUSUAL RASH, SWELLING OR PAIN  UNUSUAL VAGINAL DISCHARGE OR ITCHING   Items with * indicate a potential emergency and should be followed up as soon as possible or go to the Emergency Department if any problems should occur.  Please show the CHEMOTHERAPY  ALERT CARD or IMMUNOTHERAPY ALERT CARD at check-in to the Emergency Department and triage nurse.  Should you have questions after your visit or need to cancel or reschedule your appointment, please contact [facility]  Dept: [phone]  and follow the prompts.  Office hours are 8:00 a.m. to 4:30 p.m. Monday - Friday. Please note that voicemails left after 4:00 p.m. may not be returned until the following business day.  We are closed weekends and major holidays. You have access to a nurse at all times for urgent questions. Please call the main number to the clinic Dept: [phone] and follow the prompts.   For any non-urgent questions, you may also contact your provider using MyChart. We now offer e-Visits for anyone 73 and older to request care online for non-urgent symptoms. For details visit [email or web address].   Also download the MyChart app! Go to the app store, search "MyChart", open the app, select [facility], and log in with your MyChart username and password.  Due to Covid, a mask is required upon entering the hospital/clinic. If you do not have a mask, one will be given to you upon arrival. For doctor visits, patients may have 1 support person aged 73 or older with them. For treatment visits, patients cannot have anyone with them due to current Covid guidelines and our immunocompromised population.

## 2021-01-04 NOTE — Progress Notes (Signed)
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  This was followed by an MRI of the brain with and without contrast which showed 4 brain lesions consistent with metastases, extensive vasogenic edema associated with the posterior right frontal lesion without midline shift. 6/15-6/28/2022: 10 fractions of WBRT with Rad/Onc 11/23/2020: Guardant 360 returns with no targetable mutation.  12/14/2020: Cycle 1 Day 1 of Carbo/Pem/Pem 01/04/2021: Cycle 2 Day 1 of Carbo/Pem/Pem  Interval History:  Benjamin Willis. 75 y.o. 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 The patient's last visit was on 12/14/2020.  In the interim since the last visit he has completed Cycle 1 of chemotherapy. He presents today for Cycle 2.   On exam today Benjamin Willis is unaccompanied.  He reports that he tolerated his first cycle of chemotherapy quite well.  He did not have any issues with nausea vomiting, or diarrhea.  Denies having any issues with shortness of breath.  His appetite has been good and his weight has been stable at 153 pounds.  He notes that his energy is good and he did not notice any side effects as result of the chemotherapy.  He continues to ambulate with a cane on occasion but reports that he is otherwise  steady on his feet.  He currently denies any fevers, chills, sweats, nausea, vomiting or diarrhea.  Full 10 point ROS is listed below.  MEDICAL HISTORY:  Past Medical History:  Diagnosis Date   Allergy    Diabetes mellitus without complication ([place])    Type II   Glaucoma 10/16/2020   History of radiation therapy    [place]- SBRT 02/21/19-02/28/19, Rt Lung- IMRT 02/29/20-04/15/20 Dr. Gery Pray   History of radiation therapy 10/30/2020   whole brain 10/17/2020-10/30/2020 Dr Gery Pray   Hyperlipidemia    Hypertension    Lung cancer ([place])    Stage IA2 (cT1b, N0) non-small cell lung cancer (adenocarcinoma)    SURGICAL HISTORY: Past Surgical History:  Procedure Laterality Date   ELECTROMAGNETIC NAVIGATION BROCHOSCOPY Right 02/02/2019   Procedure: VIDEO BRONCHOSCOPY WITH NAVIGATION AND FIDUCIAL PLACEMENT;  Surgeon: Garner Nash, DO;  Location: [facility];  Service: Cardiopulmonary;  Laterality: Right;   FUDUCIAL PLACEMENT Right 02/02/2019   Procedure: Placement Of Fuducial Right Upper Lobe;  Surgeon: Garner Nash, DO;  Location: [facility];  Service: Cardiopulmonary;  Laterality: Right;   IR IMAGING GUIDED PORT INSERTION  12/13/2020   none     VIDEO BRONCHOSCOPY WITH RADIAL ENDOBRONCHIAL ULTRASOUND Right 02/02/2019   Procedure: Video Bronchoscopy With Radial Endobronchial Ultrasound;  Surgeon: Garner Nash, DO;  Location: [facility];  Service: Cardiopulmonary;  Laterality: Right;    SOCIAL HISTORY: Social History   Socioeconomic History   Marital status: Single    Spouse name: Not on file   Number of children: 0   Years of education: Not on file   Highest education level:  9th grade  Occupational History   Not on file  Tobacco Use   Smoking status: Former    Packs/day: 1.00    Years: 48.00    Pack years: 48.00    Types: Cigarettes    Quit date: 02/20/2015    Years since quitting: 5.8   Smokeless tobacco: Former    Quit date: 10/2013  Vaping Use   Vaping Use: Never used   Substance and Sexual Activity   Alcohol use: No   Drug use: No   Sexual activity: Yes    Birth control/protection: Condom  Other Topics Concern   Not on file  Social History Narrative   Not on file   Social Determinants of Health   Financial Resource Strain: Not on file  Food Insecurity: Not on file  Transportation Needs: Not on file  Physical Activity: Not on file  Stress: Not on file  Social Connections: Not on file  Intimate Partner Violence: Not on file    FAMILY HISTORY: Family History  Problem Relation Age of Onset   Lung cancer Neg Hx     ALLERGIES:  has No Known Allergies.  MEDICATIONS:  Current Outpatient Medications  Medication Sig Dispense Refill   albuterol (VENTOLIN HFA) 108 (90 Base) MCG/ACT inhaler Inhale 2 puffs into the lungs every 6 (six) hours as needed for wheezing or shortness of breath. 18 g 6   amLODipine (NORVASC) 10 MG tablet Take 1 tablet (10 mg total) by mouth daily. 90 tablet 3   blood glucose meter kit and supplies KIT Dispense based on patient and insurance preference. Use up to four times daily as directed. 1 each 0   Blood Pressure Monitoring (BLOOD PRESSURE MONITOR/ARM) DEVI Check BP once a day. DX I 10 1 each 0   cetirizine (ZYRTEC) 10 MG tablet Take 1 tablet (10 mg total) by mouth daily. 30 tablet 11   folic acid (FOLVITE) 1 MG tablet Take 1 tablet (1 mg total) by mouth daily. 90 tablet 1   gabapentin (NEURONTIN) 300 MG capsule Take 1 capsule (300 mg total) by mouth at bedtime. 90 capsule 1   glipiZIDE (GLUCOTROL) 5 MG tablet TAKE 1 TABLET BY MOUTH TWICE DAILY WITH A MEAL 180 tablet 0   latanoprost (XALATAN) 0.005 % ophthalmic solution Place 1 drop into both eyes at bedtime.     levETIRAcetam (KEPPRA) 1000 MG tablet Take 1 tablet (1,000 mg total) by mouth 2 (two) times daily. 180 tablet 1   lidocaine-prilocaine (EMLA) cream Apply 1 application topically as needed. 30 g 0   lisinopril (ZESTRIL) 40 MG tablet Take 1 tablet (40 mg total) by  mouth daily. 90 tablet 3   metFORMIN (GLUCOPHAGE) 500 MG tablet TAKE 1 TABLET BY MOUTH TWICE DAILY WITH MEALS 180 tablet 0   Multiple Vitamins-Minerals (MULTIVITAMIN MEN) TABS Take 1 tablet by mouth daily.     ondansetron (ZOFRAN) 8 MG tablet Take 1 tablet (8 mg total) by mouth every 8 (eight) hours as needed for nausea or vomiting. 30 tablet 0   pantoprazole (PROTONIX) 40 MG tablet Take 1 tablet by mouth once daily 30 tablet 2   prochlorperazine (COMPAZINE) 10 MG tablet Take 1 tablet (10 mg total) by mouth every 6 (six) hours as needed for nausea or vomiting. 30 tablet 0   sitaGLIPtin (JANUVIA) 100 MG tablet Take 1 tablet (100 mg total) by mouth daily. 90 tablet 3   No current facility-administered medications for this visit.    REVIEW OF SYSTEMS:  Constitutional: ( - ) fevers, ( - )  chills , ( - ) night sweats Eyes: ( - ) blurriness of vision, ( - ) double vision, ( - ) watery eyes Ears, nose, mouth, throat, and face: ( - ) mucositis, ( - ) sore throat Respiratory: ( - ) cough, ( - ) dyspnea, ( - ) wheezes Cardiovascular: ( - ) palpitation, ( - ) chest discomfort, ( - ) lower extremity swelling Gastrointestinal:  ( - ) nausea, ( - ) heartburn, ( - ) change in bowel habits Skin: ( - ) abnormal skin rashes Lymphatics: ( - ) new lymphadenopathy, ( - ) easy bruising Neurological: ( - ) numbness, ( - ) tingling, ( - ) new weaknesses Behavioral/Psych: ( - ) mood change, ( - ) new changes  All other systems were reviewed with the patient and are negative.  PHYSICAL EXAMINATION:  Vitals:   01/04/21 1018  BP: 133/75  Pulse: 75  Resp: 17  Temp: (!) 97 F (36.1 C)  SpO2: 98%   Filed Weights   01/04/21 1018  Weight: 153 lb (69.4 kg)    GENERAL: Well-appearing elderly African-American male, alert, no distress and comfortable SKIN: skin color, texture, turgor are normal, no rashes or significant lesions EYES: conjunctiva are pink and non-injected, sclera clear LUNGS: clear to  auscultation and percussion with normal breathing effort HEART: regular rate & rhythm and no murmurs and no lower extremity edema PSYCH: alert & oriented x 3, fluent speech NEURO: no focal motor/sensory deficits  LABORATORY DATA:  I have reviewed the data as listed CBC Latest Ref Rng & Units 01/04/2021 12/17/2020 12/14/2020  WBC 4.0 - 10.5 K/uL 3.1(L) 4.9 5.7  Hemoglobin 13.0 - 17.0 g/dL 11.1(L) 12.2(L) 12.0(L)  Hematocrit 39.0 - 52.0 % 32.8(L) 38.0(L) 36.3(L)  Platelets 150 - 400 K/uL 304 266 237    CMP Latest Ref Rng & Units 01/04/2021 12/17/2020 12/14/2020  Glucose 70 - 99 mg/dL 78 217(H) 124(H)  BUN 8 - 23 mg/dL _0 Creatinine 0.61 - 1.24 mg/dL 0.70 0.79 0.71  Sodium 135 - 145 mmol/L 146(H) 138 140  Potassium 3.5 - 5.1 mmol/L 3.9 3.9 3.9  Chloride 98 - 111 mmol/L 110 102 106  CO2 22 - 32 mmol/L _1 Calcium 8.9 - 10.3 mg/dL 9.4 9.1 9.1  Total Protein 6.5 - 8.1 g/dL 6.4(L) - 6.3(L)  Total Bilirubin 0.3 - 1.2 mg/dL 0.5 - 0.4  Alkaline Phos 38 - 126 U/L 74 - 78  AST 15 - 41 U/L 21 - 14(L)  ALT 0 - 44 U/L 24 - 13    RADIOGRAPHIC STUDIES:  DG Hand Complete Left  Result Date: 12/17/2020 CLINICAL DATA:  Left hand swelling for few days, no reported injury EXAM: LEFT HAND - COMPLETE 3+ VIEW COMPARISON:  None. FINDINGS: No fracture or dislocation. No focal osseous lesions. 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 No radiopaque foreign bodies. Dorsal left hand soft tissue swelling. IMPRESSION: No acute osseous abnormality. Dorsal left hand soft tissue swelling. 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 Electronically Signed   By: Ilona Sorrel M.D.   On: 12/17/2020 14:52   IR IMAGING GUIDED PORT INSERTION  Result Date: 12/13/2020 INDICATION: 75 year old male referred for port catheter EXAM: IMAGE GUIDED PORT CATHETER MEDICATIONS: None ANESTHESIA/SEDATION: Moderate  (conscious) sedation was employed during this procedure. A total of Versed 2.0 mg and Fentanyl 100 mcg  was administered intravenously. Moderate Sedation Time: 15 minutes. The patient's level of consciousness and vital signs were monitored continuously by radiology nursing throughout the procedure under my direct supervision. FLUOROSCOPY TIME:  Fluoroscopy Time: 0 minutes 6 seconds (1 mGy). COMPLICATIONS: None PROCEDURE: The procedure, risks, benefits, and alternatives were explained to the patient. Questions regarding the procedure were encouraged and answered. The patient understands and consents to the procedure. Ultrasound survey was performed with images stored and sent to PACs. The right neck and chest was prepped with chlorhexidine, and draped in the usual sterile fashion using maximum barrier technique (cap and mask, sterile gown, sterile gloves, large sterile sheet, hand hygiene and cutaneous antiseptic). Local anesthesia was attained by infiltration with 1% lidocaine without epinephrine. Ultrasound demonstrated patency of the right internal jugular vein, and this was documented with an image. Under real-time ultrasound guidance, this vein was accessed with a 21 gauge micropuncture needle and image documentation was performed. A small dermatotomy was made at the access site with an 11 scalpel. A 0.018" wire was advanced into the SVC and used to estimate the length of the internal catheter. The access needle exchanged for a 36F micropuncture vascular sheath. The 0.018" wire was then removed and a 0.035" wire advanced into the IVC. An appropriate location for the subcutaneous reservoir was selected below the clavicle and an incision was made through the skin and underlying soft tissues. The subcutaneous tissues were then dissected using a combination of blunt and sharp surgical technique and a pocket was formed. A single lumen power injectable portacatheter was then tunneled through the subcutaneous tissues  from the pocket to the dermatotomy and the port reservoir placed within the subcutaneous pocket. The venous access site was then serially dilated and a peel away vascular sheath placed over the wire. The wire was removed and the port catheter advanced into position under fluoroscopic guidance. The catheter tip is positioned in the cavoatrial junction. This was documented with a spot image. The portacatheter was then tested and found to flush and aspirate well. The port was flushed with saline followed by 100 units/mL heparinized saline. The pocket was then closed in two layers using first subdermal inverted interrupted absorbable sutures followed by a running subcuticular suture. The epidermis was then sealed with Dermabond. The dermatotomy at the venous access site was also seal with Dermabond. Patient tolerated the procedure well and remained hemodynamically stable throughout. No complications encountered and no significant blood loss encountered IMPRESSION: Status post right IJ port placement. Signed, Dulcy Fanny. Dellia Nims, RPVI [facility] Radiology Electronically Signed   By: Corrie Mckusick D.O.   On: 12/13/2020 16:40   VAS [place] UPPER EXTREMITY VENOUS DUPLEX  Result Date: 12/18/2020 UPPER VENOUS STUDY  Patient Name:  Benjamin Willis.  Date of Exam:   12/18/2020 Medical Rec #: [id number]          Accession #:    [id number] Date of Birth: 01-03-1946          Patient Gender: M Patient Age:   28 years Exam Location:  [facility] Procedure:      VAS [place] UPPER EXTREMITY VENOUS DUPLEX Referring Phys: ABIGAIL HARRIS --------------------------------------------------------------------------------  Indications: Edema Comparison Study: No prior study Performing Technologist: Maudry Mayhew MHA, RDMS, RVT, RDCS  Examination Guidelines: A complete evaluation includes B-mode imaging, spectral Doppler, color Doppler, and power Doppler as needed of all accessible  portions of each vessel. Bilateral testing is considered an integral part of a complete  examination. Limited examinations for reoccurring indications may be performed as noted.  Right Findings: +----------+------------+---------+-----------+----------+-------+ RIGHT     CompressiblePhasicitySpontaneousPropertiesSummary +----------+------------+---------+-----------+----------+-------+ Subclavian               Yes       Yes                      +----------+------------+---------+-----------+----------+-------+  Left Findings: +----------+------------+---------+-----------+----------+-------+ LEFT      CompressiblePhasicitySpontaneousPropertiesSummary +----------+------------+---------+-----------+----------+-------+ IJV           Full       Yes       Yes                      +----------+------------+---------+-----------+----------+-------+ Subclavian    Full       Yes       Yes                      +----------+------------+---------+-----------+----------+-------+ Axillary      Full       Yes       Yes                      +----------+------------+---------+-----------+----------+-------+ Brachial      Full       Yes       Yes                      +----------+------------+---------+-----------+----------+-------+ Radial        Full                                          +----------+------------+---------+-----------+----------+-------+ Ulnar         Full                                          +----------+------------+---------+-----------+----------+-------+ Cephalic      Full                                          +----------+------------+---------+-----------+----------+-------+ Basilic       Full                                          +----------+------------+---------+-----------+----------+-------+  Summary:  Right: No evidence of thrombosis in the subclavian.  Left: No evidence of deep vein thrombosis in the upper extremity.  No evidence of superficial vein thrombosis in the upper extremity.  *See table(s) above for measurements and observations.  Diagnosing physician: Deitra Mayo MD Electronically signed by Deitra Mayo MD on 12/18/2020 at 3:25:47 PM.    Final     ASSESSMENT & PLAN Benjamin Willis. 75 y.o. 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  After review the labs, reviewed records, discussion with the patient the findings are most consistent with metastatic adenocarcinoma with spread to the brain.  Unfortunately we do not have new pathology as the lesions in his brain were not resectable and he underwent radiation therapy  instead.  We can presume based off his prior pathology that this represents the original adenocarcinoma.  As such I do believe it is reasonable to proceed with carboplatin, pembrolizumab, pemetrexed therapy in order to help treat any possible micrometastatic disease.  The patient also underwent whole brain radiation therapy instead of SRS.  Therefore I do believe systemic therapy will be reasonable.  This plan of care was discussed with the resident lung expert Dr. Julien Nordmann. Additionally our [place] 360 testing was negative for a targetable mutation.   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 Lost to follow up --presents with brain metastasis only. No evidence of disease in the C/A/P noted on CT scan from 10/16/2020.  --Patient completed whole brain radiation, but did not wish to undergo SRS.  Radiation oncology noted that this may be an option in the future if he were to have recurrence in the brain --Guardant 360 on 11/01/2020 showed no targetable mutations.  Plan: --plan to proceed with Carbo/Pem/Pem chemotherapy, Cycle 2 Day 1 today. --Plan for CT scan after Cycle 4 of  chemotherapy.  --continue to follow with neuro-oncology for brain metastasis  #Supportive Care -- chemotherapy education complete  -- port placed --daily PO 52m folic acid with pemetrexed. -- zofran [id number] PRN and compazine 1013mO q6H for nausea -- EMLA cream for port -- no pain medication required at this time.   No orders of the defined types were placed in this encounter.  All questions were answered. The patient knows to call the clinic with any problems, questions or concerns.  A total of more than 30 minutes were spent on this encounter with face-to-face time and non-face-to-face time, including preparing to see the patient, ordering tests and/or medications, counseling the patient and coordination of care as outlined above.   JohLedell PeoplesD Department of Hematology/Oncology [place] WesPhoe[facility] Worth Medical Centerone: [phone]: [phone]: [email or web address]  01/04/2021 10:54 AM

## 2021-01-07 ENCOUNTER — Other Ambulatory Visit: Payer: Self-pay

## 2021-01-07 ENCOUNTER — Encounter (HOSPITAL_COMMUNITY): Payer: Self-pay

## 2021-01-07 ENCOUNTER — Emergency Department (HOSPITAL_COMMUNITY): Payer: Medicare Other

## 2021-01-07 ENCOUNTER — Inpatient Hospital Stay (HOSPITAL_COMMUNITY)
Admission: EM | Admit: 2021-01-07 | Discharge: 2021-01-10 | DRG: 690 | Disposition: A | Discharge status: Home Health | Payer: Medicare Other | Attending: Internal Medicine | Admitting: Internal Medicine

## 2021-01-07 DIAGNOSIS — Z87891 Personal history of nicotine dependence: Secondary | ICD-10-CM | POA: Diagnosis not present

## 2021-01-07 DIAGNOSIS — Z20822 Contact with and (suspected) exposure to covid-19: Secondary | ICD-10-CM | POA: Diagnosis present

## 2021-01-07 DIAGNOSIS — Z923 Personal history of irradiation: Secondary | ICD-10-CM

## 2021-01-07 DIAGNOSIS — D638 Anemia in other chronic diseases classified elsewhere: Secondary | ICD-10-CM | POA: Diagnosis present

## 2021-01-07 DIAGNOSIS — B9620 Unspecified Escherichia coli [E. coli] as the cause of diseases classified elsewhere: Secondary | ICD-10-CM | POA: Diagnosis present

## 2021-01-07 DIAGNOSIS — A419 Sepsis, unspecified organism: Secondary | ICD-10-CM

## 2021-01-07 DIAGNOSIS — C7931 Secondary malignant neoplasm of brain: Secondary | ICD-10-CM | POA: Diagnosis present

## 2021-01-07 DIAGNOSIS — C3411 Malignant neoplasm of upper lobe, right bronchus or lung: Secondary | ICD-10-CM | POA: Diagnosis present

## 2021-01-07 DIAGNOSIS — K219 Gastro-esophageal reflux disease without esophagitis: Secondary | ICD-10-CM | POA: Diagnosis present

## 2021-01-07 DIAGNOSIS — E876 Hypokalemia: Secondary | ICD-10-CM | POA: Diagnosis present

## 2021-01-07 DIAGNOSIS — R296 Repeated falls: Secondary | ICD-10-CM | POA: Diagnosis present

## 2021-01-07 DIAGNOSIS — C7802 Secondary malignant neoplasm of left lung: Secondary | ICD-10-CM | POA: Diagnosis present

## 2021-01-07 DIAGNOSIS — R531 Weakness: Secondary | ICD-10-CM | POA: Diagnosis not present

## 2021-01-07 DIAGNOSIS — Z7984 Long term (current) use of oral hypoglycemic drugs: Secondary | ICD-10-CM | POA: Diagnosis not present

## 2021-01-07 DIAGNOSIS — N390 Urinary tract infection, site not specified: Secondary | ICD-10-CM | POA: Diagnosis present

## 2021-01-07 DIAGNOSIS — R6510 Systemic inflammatory response syndrome (SIRS) of non-infectious origin without acute organ dysfunction: Secondary | ICD-10-CM | POA: Diagnosis not present

## 2021-01-07 DIAGNOSIS — G40909 Epilepsy, unspecified, not intractable, without status epilepticus: Secondary | ICD-10-CM | POA: Diagnosis present

## 2021-01-07 DIAGNOSIS — I10 Essential (primary) hypertension: Secondary | ICD-10-CM | POA: Diagnosis present

## 2021-01-07 DIAGNOSIS — R001 Bradycardia, unspecified: Secondary | ICD-10-CM | POA: Diagnosis not present

## 2021-01-07 DIAGNOSIS — E119 Type 2 diabetes mellitus without complications: Secondary | ICD-10-CM | POA: Diagnosis present

## 2021-01-07 DIAGNOSIS — Z79899 Other long term (current) drug therapy: Secondary | ICD-10-CM | POA: Diagnosis not present

## 2021-01-07 DIAGNOSIS — R000 Tachycardia, unspecified: Secondary | ICD-10-CM | POA: Diagnosis not present

## 2021-01-07 DIAGNOSIS — R0902 Hypoxemia: Secondary | ICD-10-CM | POA: Diagnosis not present

## 2021-01-07 DIAGNOSIS — N3000 Acute cystitis without hematuria: Secondary | ICD-10-CM | POA: Diagnosis not present

## 2021-01-07 DIAGNOSIS — R5381 Other malaise: Secondary | ICD-10-CM | POA: Diagnosis present

## 2021-01-07 LAB — CBC WITH DIFFERENTIAL/PLATELET
Abs Immature Granulocytes: 0.25 10*3/uL — ABNORMAL HIGH (ref 0.00–0.07)
Basophils Absolute: 0 10*3/uL (ref 0.0–0.1)
Basophils Relative: 0 %
Eosinophils Absolute: 0 10*3/uL (ref 0.0–0.5)
Eosinophils Relative: 1 %
HCT: 30.8 % — ABNORMAL LOW (ref 39.0–52.0)
Hemoglobin: 9.9 g/dL — ABNORMAL LOW (ref 13.0–17.0)
Immature Granulocytes: 11 %
Lymphocytes Relative: 5 %
Lymphs Abs: 0.1 10*3/uL — ABNORMAL LOW (ref 0.7–4.0)
MCH: 30.6 pg (ref 26.0–34.0)
MCHC: 32.1 g/dL (ref 30.0–36.0)
MCV: 95.1 fL (ref 80.0–100.0)
Monocytes Absolute: 0 10*3/uL — ABNORMAL LOW (ref 0.1–1.0)
Monocytes Relative: 0 %
Neutro Abs: 1.8 10*3/uL (ref 1.7–7.7)
Neutrophils Relative %: 83 %
Platelets: 166 10*3/uL (ref 150–400)
RBC: 3.24 MIL/uL — ABNORMAL LOW (ref 4.22–5.81)
RDW: 13.7 % (ref 11.5–15.5)
WBC: 2.2 10*3/uL — ABNORMAL LOW (ref 4.0–10.5)
nRBC: 0 % (ref 0.0–0.2)

## 2021-01-07 LAB — GLUCOSE, CAPILLARY
Glucose-Capillary: 75 mg/dL (ref 70–99)
Glucose-Capillary: 89 mg/dL (ref 70–99)

## 2021-01-07 LAB — URINALYSIS, ROUTINE W REFLEX MICROSCOPIC
Bilirubin Urine: NEGATIVE
Glucose, UA: NEGATIVE mg/dL
Ketones, ur: 15 mg/dL — AB
Nitrite: POSITIVE — AB
Protein, ur: 30 mg/dL — AB
Specific Gravity, Urine: 1.02 (ref 1.005–1.030)
pH: 6 (ref 5.0–8.0)

## 2021-01-07 LAB — RESP PANEL BY RT-PCR (FLU A&B, COVID) ARPGX2
Influenza A by PCR: NEGATIVE
Influenza B by PCR: NEGATIVE
SARS Coronavirus 2 by RT PCR: NEGATIVE

## 2021-01-07 LAB — COMPREHENSIVE METABOLIC PANEL
ALT: 28 U/L (ref 0–44)
AST: 41 U/L (ref 15–41)
Albumin: 3.2 g/dL — ABNORMAL LOW (ref 3.5–5.0)
Alkaline Phosphatase: 72 U/L (ref 38–126)
Anion gap: 7 (ref 5–15)
BUN: 22 mg/dL (ref 8–23)
CO2: 25 mmol/L (ref 22–32)
Calcium: 8.6 mg/dL — ABNORMAL LOW (ref 8.9–10.3)
Chloride: 110 mmol/L (ref 98–111)
Creatinine, Ser: 0.95 mg/dL (ref 0.61–1.24)
GFR, Estimated: >60 mL/min (ref >60)
Glucose, Bld: 77 mg/dL (ref 70–99)
Potassium: 3.5 mmol/L (ref 3.5–5.1)
Sodium: 142 mmol/L (ref 135–145)
Total Bilirubin: 1 mg/dL (ref 0.3–1.2)
Total Protein: 5.7 g/dL — ABNORMAL LOW (ref 6.5–8.1)

## 2021-01-07 LAB — HEMOGLOBIN A1C
Hgb A1c MFr Bld: 8.6 % — ABNORMAL HIGH (ref 4.8–5.6)
Mean Plasma Glucose: 200.12 mg/dL

## 2021-01-07 LAB — PROTIME-INR
INR: 1.1 (ref 0.8–1.2)
Prothrombin Time: 14.2 seconds (ref 11.4–15.2)

## 2021-01-07 LAB — LACTIC ACID, PLASMA: Lactic Acid, Venous: 1 mmol/L (ref 0.5–1.9)

## 2021-01-07 LAB — APTT: aPTT: 29 seconds (ref 24–36)

## 2021-01-07 IMAGING — DX DG CHEST 1V PORT
2 series · 2 of 2 positions shown · non-contrast
Comparison: [DATE], [DATE]

CLINICAL DATA: Weakness, concern for sepsis, lung cancer

EXAM:
PORTABLE CHEST 1 VIEW

[chest ap (1 of 2)]
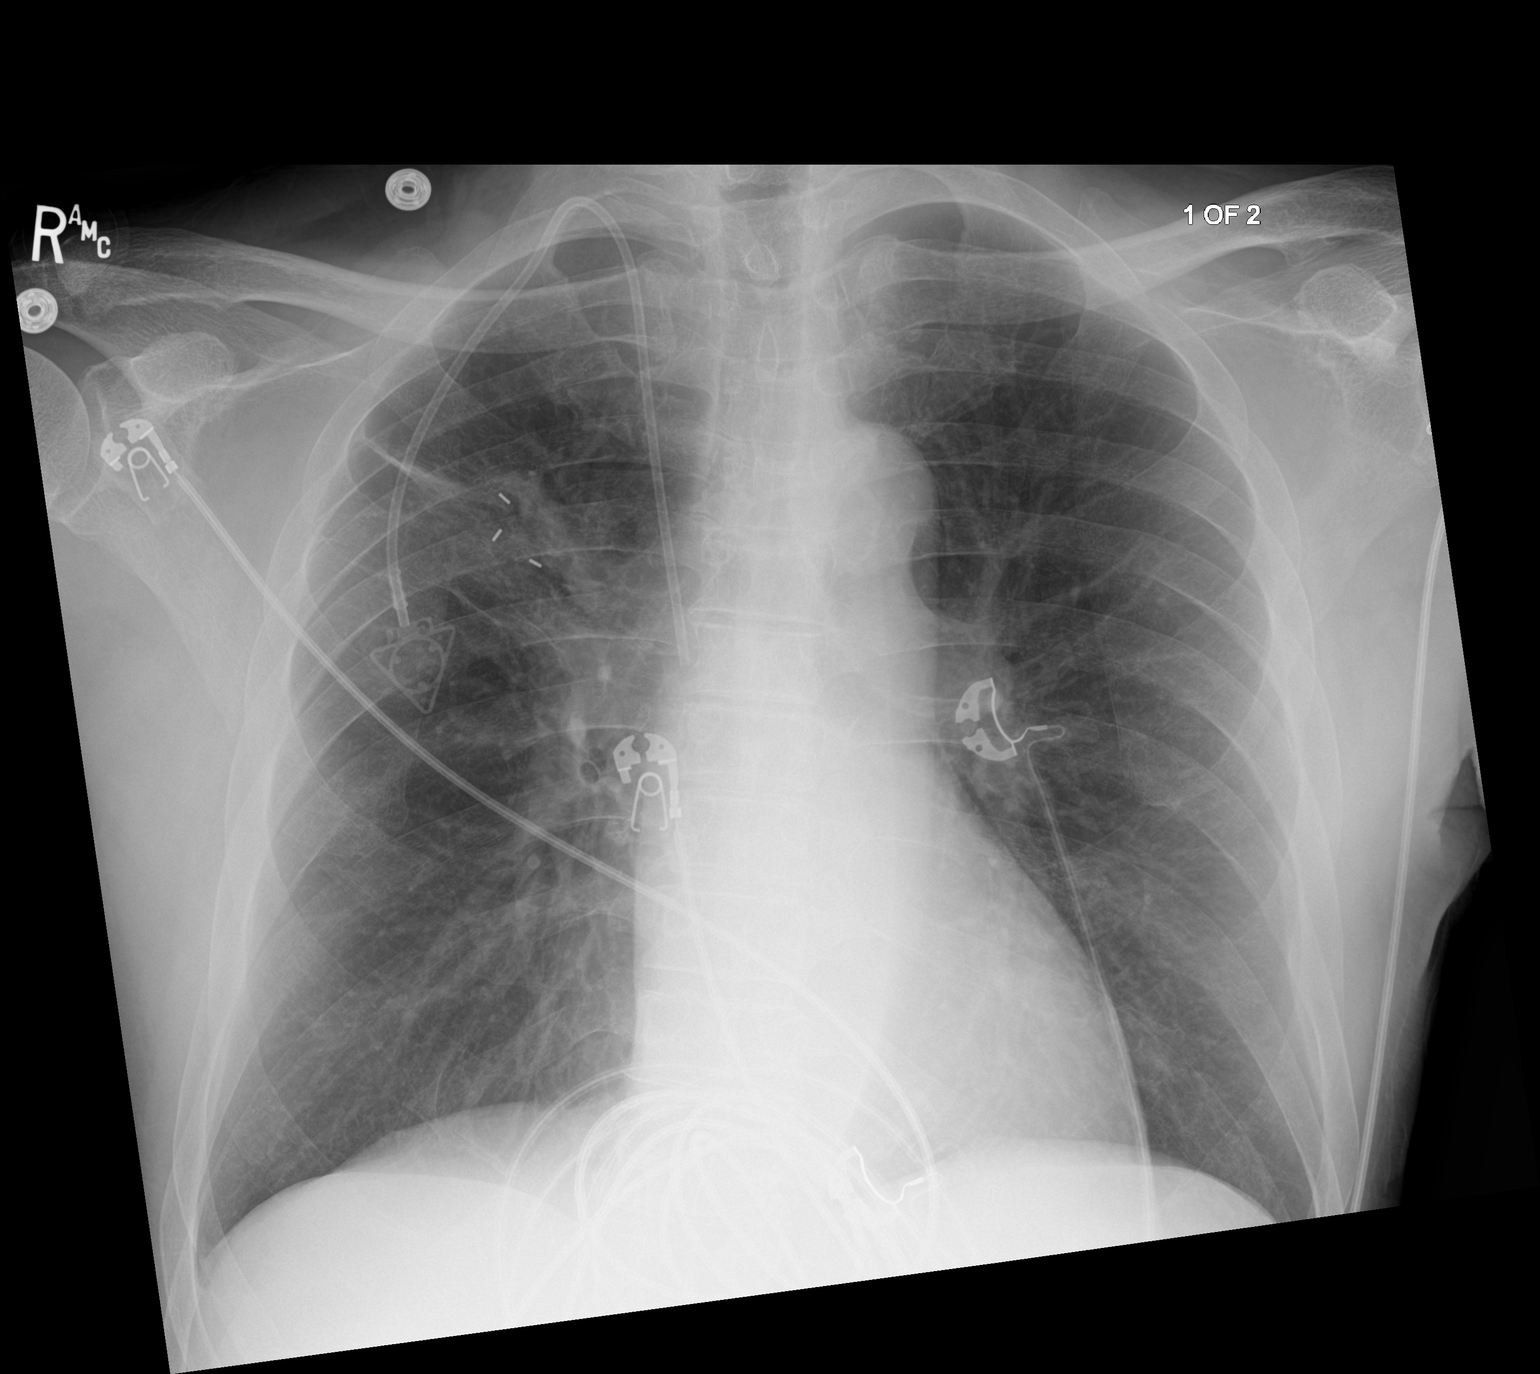

[chest ap (2 of 2)]
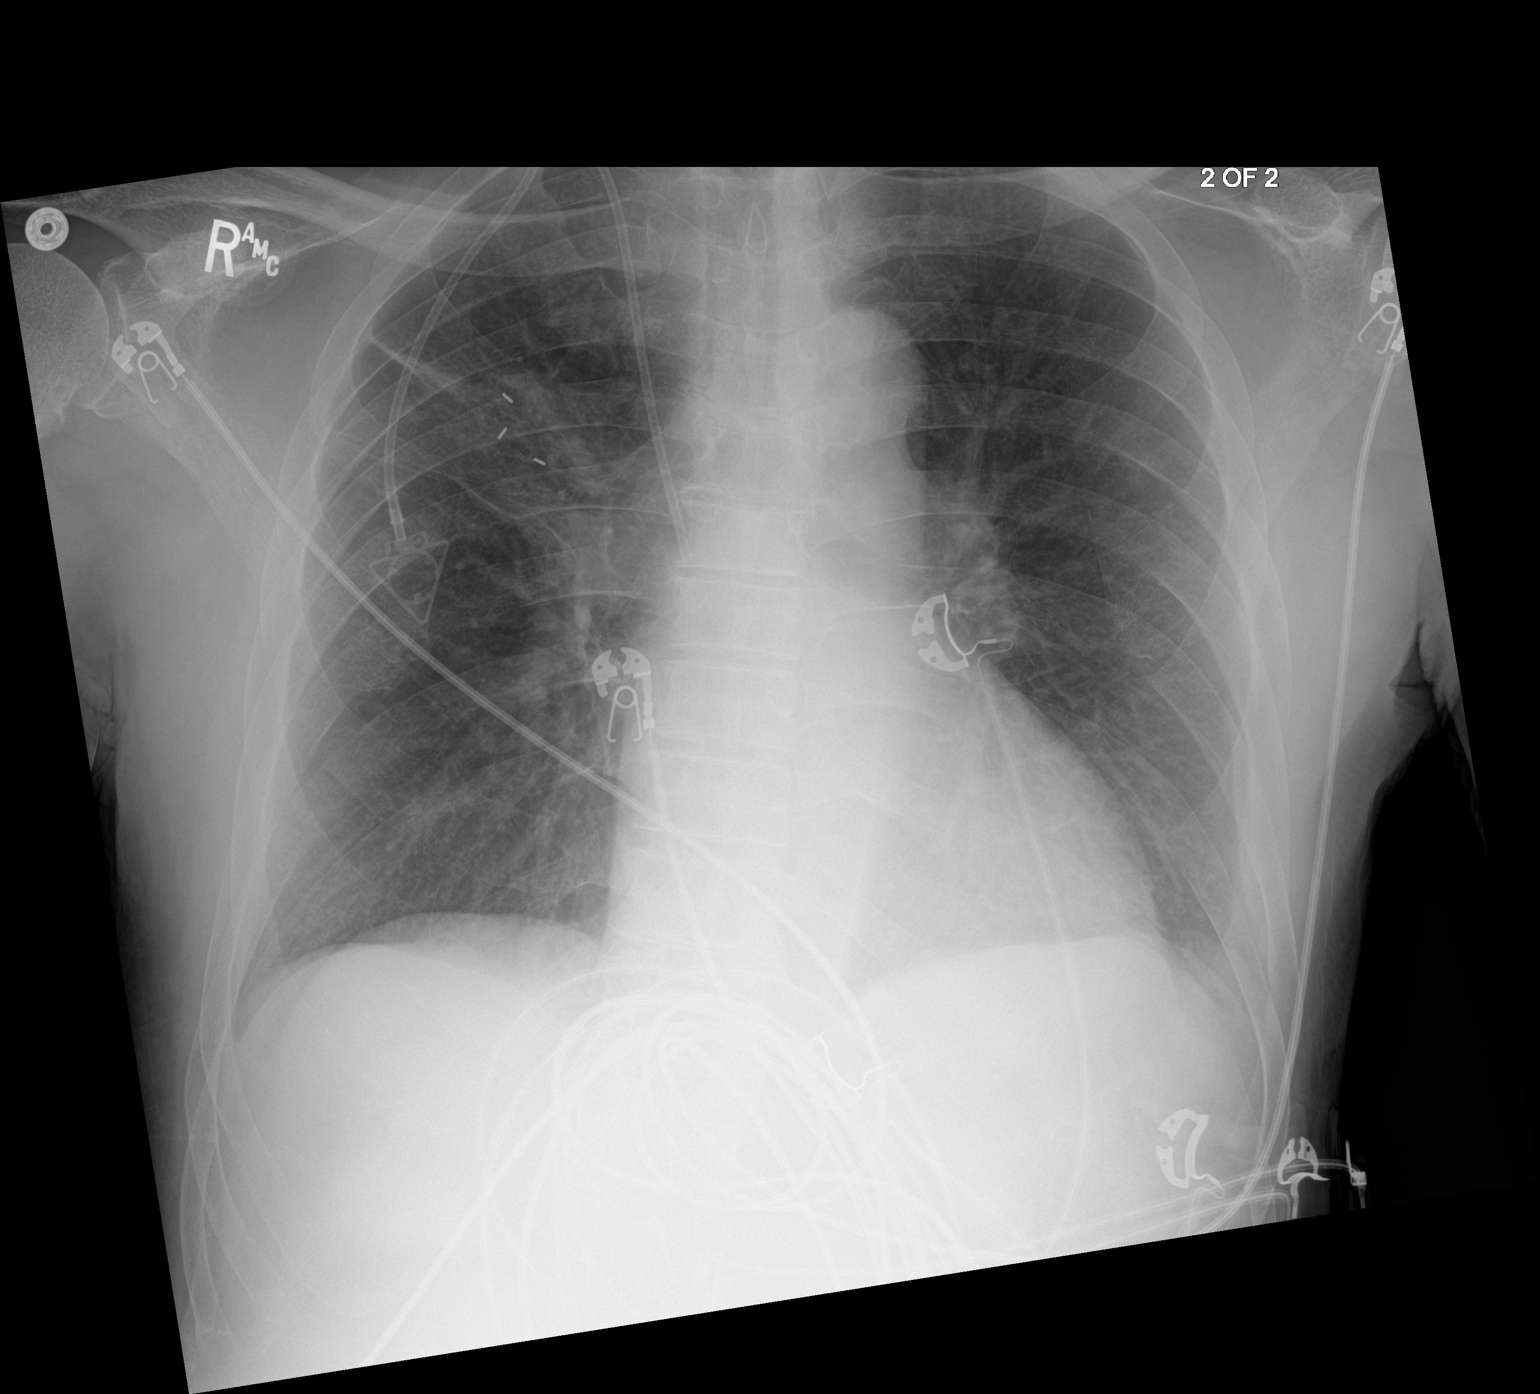

[2 of 2 positions shown; findings below may reference images not displayed]

FINDINGS: Right IJ power port catheter tip mid SVC level. Stable right upper
lobe perihilar masslike opacity with radiopaque markers. No new
superimposed airspace process, collapse or consolidation. No large
effusion or pneumothorax. Trachea midline. Normal heart size and
vascularity.
IMPRESSION: No superimposed acute chest process.

Stable right upper lobe perihilar masslike opacity

## 2021-01-07 MED ORDER — LACTATED RINGERS IV BOLUS (SEPSIS)
2000.0000 mL | Freq: Once | INTRAVENOUS | Status: AC
  Administered 2021-01-07: 2000 mL via INTRAVENOUS

## 2021-01-07 MED ORDER — SODIUM CHLORIDE 0.9 % IV SOLN: INTRAVENOUS | Status: DC

## 2021-01-07 MED ORDER — INSULIN ASPART 100 UNIT/ML IJ SOLN: 0.0000 [IU] | Freq: Three times a day (TID) | INTRAMUSCULAR | Status: DC

## 2021-01-07 MED ORDER — LATANOPROST 0.005 % OP SOLN
1.0000 [drp] | Freq: Every day | OPHTHALMIC | Status: DC
  Administered 2021-01-07 – 2021-01-09 (×3): 1 [drp] via OPHTHALMIC
  Filled 2021-01-07: qty 2.5

## 2021-01-07 MED ORDER — INSULIN ASPART 100 UNIT/ML IJ SOLN: 0.0000 [IU] | Freq: Every day | INTRAMUSCULAR | Status: DC

## 2021-01-07 MED ORDER — ACETAMINOPHEN 650 MG RE SUPP: 650.0000 mg | Freq: Four times a day (QID) | RECTAL | Status: DC | PRN

## 2021-01-07 MED ORDER — SODIUM CHLORIDE 0.9 % IV SOLN
1.0000 g | INTRAVENOUS | Status: DC
Start: 2021-01-08 — End: 2021-01-09
  Administered 2021-01-08: 1 g via INTRAVENOUS
  Filled 2021-01-07 (×2): qty 10

## 2021-01-07 MED ORDER — LORATADINE 10 MG PO TABS: 10.0000 mg | ORAL_TABLET | Freq: Every day | ORAL | Status: DC

## 2021-01-07 MED ORDER — VANCOMYCIN HCL 1500 MG/300ML IV SOLN
1500.0000 mg | Freq: Once | INTRAVENOUS | Status: DC
  Filled 2021-01-07: qty 300

## 2021-01-07 MED ORDER — HYDRALAZINE HCL 20 MG/ML IJ SOLN: 10.0000 mg | Freq: Three times a day (TID) | INTRAMUSCULAR | Status: DC | PRN

## 2021-01-07 MED ORDER — ACETAMINOPHEN 500 MG PO TABS
1000.0000 mg | ORAL_TABLET | Freq: Once | ORAL | Status: AC
  Administered 2021-01-07: 1000 mg via ORAL
  Filled 2021-01-07: qty 2

## 2021-01-07 MED ORDER — SODIUM CHLORIDE 0.9 % IV SOLN
2.0000 g | Freq: Once | INTRAVENOUS | Status: AC
  Administered 2021-01-07: 2 g via INTRAVENOUS
  Filled 2021-01-07: qty 20

## 2021-01-07 MED ORDER — PROCHLORPERAZINE MALEATE 10 MG PO TABS: 10.0000 mg | ORAL_TABLET | Freq: Four times a day (QID) | ORAL | Status: DC | PRN

## 2021-01-07 MED ORDER — LEVETIRACETAM 500 MG PO TABS
1000.0000 mg | ORAL_TABLET | Freq: Two times a day (BID) | ORAL | Status: DC
  Administered 2021-01-07 – 2021-01-10 (×6): 1000 mg via ORAL
  Filled 2021-01-07 (×7): qty 2

## 2021-01-07 MED ORDER — ALBUTEROL SULFATE (2.5 MG/3ML) 0.083% IN NEBU: 2.5000 mg | INHALATION_SOLUTION | Freq: Four times a day (QID) | RESPIRATORY_TRACT | Status: DC | PRN

## 2021-01-07 MED ORDER — FOLIC ACID 1 MG PO TABS
1.0000 mg | ORAL_TABLET | Freq: Every day | ORAL | Status: DC
  Administered 2021-01-07 – 2021-01-10 (×4): 1 mg via ORAL
  Filled 2021-01-07 (×4): qty 1

## 2021-01-07 MED ORDER — GABAPENTIN 300 MG PO CAPS
300.0000 mg | ORAL_CAPSULE | Freq: Every day | ORAL | Status: DC
  Administered 2021-01-07 – 2021-01-09 (×3): 300 mg via ORAL
  Filled 2021-01-07 (×3): qty 1

## 2021-01-07 MED ORDER — ACETAMINOPHEN 325 MG PO TABS
650.0000 mg | ORAL_TABLET | Freq: Four times a day (QID) | ORAL | Status: DC | PRN
  Filled 2021-01-07: qty 2

## 2021-01-07 MED ORDER — PANTOPRAZOLE SODIUM 40 MG PO TBEC: 40.0000 mg | DELAYED_RELEASE_TABLET | Freq: Every day | ORAL | Status: DC

## 2021-01-07 MED ORDER — ADULT MULTIVITAMIN W/MINERALS CH
1.0000 | ORAL_TABLET | Freq: Every day | ORAL | Status: DC
  Administered 2021-01-07 – 2021-01-10 (×4): 1 via ORAL
  Filled 2021-01-07 (×4): qty 1

## 2021-01-07 MED ORDER — ALBUTEROL SULFATE HFA 108 (90 BASE) MCG/ACT IN AERS: 2.0000 | INHALATION_SPRAY | Freq: Four times a day (QID) | RESPIRATORY_TRACT | Status: DC | PRN

## 2021-01-07 MED ORDER — PIPERACILLIN-TAZOBACTAM 3.375 G IVPB: 3.3750 g | Freq: Once | INTRAVENOUS | Status: DC

## 2021-01-07 NOTE — Progress Notes (Signed)
A consult was received from an ED physician for vancomycin and Zosyn per pharmacy dosing.  The patient's profile has been reviewed for ht/wt/allergies/indication/available labs.   A one time order has been placed for vancomycin and Zosyn.  Further antibiotics/pharmacy consults should be ordered by admitting physician if indicated.                       Thank you, Dimple Nanas 01/07/2021  2:21 PM

## 2021-01-07 NOTE — ED Triage Notes (Signed)
Pt BIB EMS c/o unsteady gait, falls, and tremors x2 days. Pt has a history of Brain Ca, last chemo 1.5 weeks ago. AAOx2. Pt denies any pain.

## 2021-01-07 NOTE — ED Notes (Signed)
Applied male primofit for urine specimen collection and upon pt request.

## 2021-01-07 NOTE — H&P (Signed)
History and Physical    Benjamin Willis. PPI:[id number] DOB: [birth year] DOA: 01/07/2021  PCP: Horald Pollen, MD  Patient coming from: Home  Chief Complaint: multiple falls  HPI: Benjamin Willis. is a 75 y.o. 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 Presenting with multiple falls, weakness. History is mostly from sister. She reports that he's had multiple falls over the last 2 days. He fell while walking in the yard a couple of days ago and then had another fall that evening. He did not hit his head either time. There was no LOC. She reports that yesterday morning, they found him on the floor in his room and unable to get up on his own. He denies any head injury or LOC. He reports that he just felt weak. Family checked his glucose, temperature and BP. Everything was normal. Family noted that yesterday evening, the patient had a poor appetite and seemed weak. When family checked in on him this morning, he was shaking in bed. Family was concerned that he was having a seizure, but the patient was talking to them during the event and following commands. He never lost consciousness. They became concerned and called for EMS. Of note, family states that he hasn't complained of dysuria over the last few days, but they have noted increased urination. They deny any other aggravating or alleviating factors.   ED Course: Found to have a UTI. He was started on rocephin. [facility] was called for admission.   Review of Systems:  Denies CP, dyspnea, N/V/D, fever. Reports poor appetite. Review of systems is otherwise negative for all not mentioned in HPI.   PMHx Past Medical History:  Diagnosis Date   Allergy    Diabetes mellitus without complication ([place])    Type II   Glaucoma 10/16/2020   History of radiation therapy    Th[facility]ax- SBRT 02/21/19-02/28/19, Rt Lung- IMRT 02/29/20-04/15/20 Dr. Gery Pray   History of radiation therapy 10/30/2020   whole  brain 10/17/2020-10/30/2020 Dr Gery Pray   Hyperlipidemia    Hypertension    Lung cancer ([place])    Stage IA2 (cT1b, N0) non-small cell lung cancer (adenocarcinoma)    PSHx Past Surgical History:  Procedure Laterality Date   ELECTROMAGNETIC NAVIGATION BROCHOSCOPY Right 02/02/2019   Procedure: VIDEO BRONCHOSCOPY WITH NAVIGATION AND FIDUCIAL PLACEMENT;  Surgeon: Garner Nash, DO;  Location: [facility];  Service: Cardiopulmonary;  Laterality: Right;   FUDUCIAL PLACEMENT Right 02/02/2019   Procedure: Placement Of Fuducial Right Upper Lobe;  Surgeon: Garner Nash, DO;  Location: [facility];  Service: Cardiopulmonary;  Laterality: Right;   IR IMAGING GUIDED PORT INSERTION  12/13/2020   none     VIDEO BRONCHOSCOPY WITH RADIAL ENDOBRONCHIAL ULTRASOUND Right 02/02/2019   Procedure: Video Bronchoscopy With Radial Endobronchial Ultrasound;  Surgeon: Garner Nash, DO;  Location: [facility];  Service: Cardiopulmonary;  Laterality: Right;    SocHx  reports that he quit smoking about 5 years ago. His smoking use included cigarettes. He has a 48.00 pack-year smoking history. He quit smokeless tobacco use about 7 years ago. He reports that he does not drink alcohol and does not use drugs.  No Known Allergies  FamHx Family History  Problem Relation Age of Onset   Lung cancer Neg Hx     Prior to Admission medications   Medication Sig Start Date End Date Taking? Authorizing Provider  acetaminophen (TYLENOL) 500 MG tablet Take 1,000 mg by mouth every 6 (  six) hours as needed for mild pain.   Yes [provider]  albuterol (VENTOLIN HFA) 108 (90 Base) MCG/ACT inhaler Inhale 2 puffs into the lungs every 6 (six) hours as needed for wheezing or shortness of breath. 01/19/19  Yes Icard, Bradley L, DO  amLODipine (NORVASC) 10 MG tablet Take 1 tablet (10 mg total) by mouth daily. 12/06/19  Yes Sagardia, Ines Bloomer, MD  cetirizine (ZYRTEC) 10 MG tablet Take 1 tablet (10 mg total) by mouth daily. Patient taking  differently: Take 10 mg by mouth daily as needed for allergies. 09/03/17  Yes Jaynee Eagles, PA-C  folic acid (FOLVITE) 1 MG tablet Take 1 tablet (1 mg total) by mouth daily. 11/30/20  Yes Orson Slick, MD  gabapentin (NEURONTIN) 300 MG capsule Take 1 capsule (300 mg total) by mouth at bedtime. 12/19/20 03/19/21 Yes Sagardia, Ines Bloomer, MD  glipiZIDE (GLUCOTROL) 5 MG tablet TAKE 1 TABLET BY MOUTH TWICE DAILY WITH A MEAL Patient taking differently: Take 5 mg by mouth 2 (two) times daily before a meal. 10/08/20  Yes Sagardia, Ines Bloomer, MD  latanoprost (XALATAN) 0.005 % ophthalmic solution Place 1 drop into both eyes at bedtime. 01/24/20  Yes [provider]  levETIRAcetam (KEPPRA) 1000 MG tablet Take 1 tablet (1,000 mg total) by mouth 2 (two) times daily. 12/19/20 03/19/21 Yes Sagardia, Ines Bloomer, MD  lidocaine-prilocaine (EMLA) cream Apply 1 application topically as needed. Patient taking differently: Apply 1 application topically as needed (for port before tx). 11/30/20  Yes Orson Slick, MD  lisinopril (ZESTRIL) 40 MG tablet Take 1 tablet (40 mg total) by mouth daily. 12/06/19 01/07/21 Yes Sagardia, Ines Bloomer, MD  metFORMIN (GLUCOPHAGE) 500 MG tablet TAKE 1 TABLET BY MOUTH TWICE DAILY WITH MEALS Patient taking differently: Take 500 mg by mouth daily with breakfast. 11/22/20  Yes Sagardia, Ines Bloomer, MD  Multiple Vitamins-Minerals (MULTIVITAMIN MEN) TABS Take 1 tablet by mouth daily.   Yes [provider]  ondansetron (ZOFRAN) 8 MG tablet Take 1 tablet (8 mg total) by mouth every 8 (eight) hours as needed for nausea or vomiting. 11/30/20  Yes Orson Slick, MD  prochlorperazine (COMPAZINE) 10 MG tablet Take 1 tablet (10 mg total) by mouth every 6 (six) hours as needed for nausea or vomiting. 11/30/20  Yes Orson Slick, MD  sitaGLIPtin (JANUVIA) 100 MG tablet Take 1 tablet (100 mg total) by mouth daily. 05/31/20 01/07/21 Yes Sagardia, Ines Bloomer, MD  blood glucose meter kit  and supplies KIT Dispense based on patient and insurance preference. Use up to four times daily as directed. 10/23/20   Elodia Florence., MD  Blood Pressure Monitoring (BLOOD PRESSURE MONITOR/ARM) DEVI Check BP once a day. DX I 10 04/18/19   Horald Pollen, MD  pantoprazole (PROTONIX) 40 MG tablet Take 1 tablet by mouth once daily Patient not taking: Reported on 01/07/2021 12/19/20   Horald Pollen, MD    Physical Exam: Vitals:   01/07/21 1230 01/07/21 1300 01/07/21 1330 01/07/21 1400  BP: (!) 102/59 (!) 128/91 111/66 108/79  Pulse:  (!) 103 94 92  Resp: (!) 0 $Rem'19 18 13  'ZkHq$ Temp:      TempSrc:      SpO2:  95% 98% 97%  Weight:      Height:        General: 75 y.o. male resting in bed in NAD Eyes: PERRL, normal sclera ENMT: Nares patent w/o discharge, orophaynx clear, dentition normal, ears  w/o discharge/lesions/ulcers Neck: Supple, trachea midline Cardiovascular: RRR, +S1, S2, no m/g/r, equal pulses throughout Respiratory: scattered mild rhonchi, no w/r, normal WOB GI: BS+, NDNT, no masses noted, no organomegaly noted MSK: No e/c/c Skin: No rashes, bruises, ulcerations noted Neuro: A&O x 2 (name, place), no focal deficits Psyc: Appropriate interaction and affect, calm/cooperative  Labs on Admission: I have personally reviewed following labs and imaging studies  CBC: Recent Labs  Lab 01/04/21 1005 01/07/21 1115  WBC 3.1* 2.2*  NEUTROABS 2.0 1.8  HGB 11.1* 9.9*  HCT 32.8* 30.8*  MCV 92.9 95.1  PLT 304 756   Basic Metabolic Panel: Recent Labs  Lab 01/04/21 1005 01/07/21 1115  NA 146* 142  K 3.9 3.5  CL 110 110  CO2 26 25  GLUCOSE 78 77  BUN 11 22  CREATININE 0.70 0.95  CALCIUM 9.4 8.6*   GFR: Estimated Creatinine Clearance: 71.1 mL/min (by C-G formula based on SCr of 0.95 mg/dL). Liver Function Tests: Recent Labs  Lab 01/04/21 1005 01/07/21 1115  AST 21 41  ALT 24 28  ALKPHOS 74 72  BILITOT 0.5 1.0  PROT 6.4* 5.7*  ALBUMIN 3.8 3.2*   No  results for input(s): LIPASE, AMYLASE in the last 168 hours. No results for input(s): AMMONIA in the last 168 hours. Coagulation Profile: Recent Labs  Lab 01/07/21 1115  INR 1.1   Cardiac Enzymes: No results for input(s): CKTOTAL, CKMB, CKMBINDEX, TROPONINI in the last 168 hours. BNP (last 3 results) No results for input(s): PROBNP in the last 8760 hours. HbA1C: No results for input(s): HGBA1C in the last 72 hours. CBG: No results for input(s): GLUCAP in the last 168 hours. Lipid Profile: No results for input(s): CHOL, HDL, LDLCALC, TRIG, CHOLHDL, LDLDIRECT in the last 72 hours. Thyroid Function Tests: No results for input(s): TSH, T4TOTAL, FREET4, T3FREE, THYROIDAB in the last 72 hours. Anemia Panel: No results for input(s): VITAMINB12, FOLATE, FERRITIN, TIBC, IRON, RETICCTPCT in the last 72 hours. Urine analysis:    Component Value Date/Time   COLORURINE YELLOW (A) 01/07/2021 1322   APPEARANCEUR HAZY (A) 01/07/2021 1322   LABSPEC 1.020 01/07/2021 1322   PHURINE 6.0 01/07/2021 1322   GLUCOSEU NEGATIVE 01/07/2021 1322   HGBUR MODERATE (A) 01/07/2021 1322   BILIRUBINUR NEGATIVE 01/07/2021 1322   KETONESUR 15 (A) 01/07/2021 1322   PROTEINUR 30 (A) 01/07/2021 1322   NITRITE POSITIVE (A) 01/07/2021 1322   LEUKOCYTESUR TRACE (A) 01/07/2021 1322    Radiological Exams on Admission: DG Chest Port 1 View  Result Date: 01/07/2021 CLINICAL DATA:  Weakness, concern for sepsis, lung cancer EXAM: PORTABLE CHEST 1 VIEW COMPARISON:  10/15/2020, 10/16/2020 FINDINGS: Right IJ power port catheter tip mid SVC level. Stable right upper lobe perihilar masslike opacity with radiopaque markers. No new superimposed airspace process, collapse or consolidation. No large effusion or pneumothorax. Trachea midline. Normal heart size and vascularity. IMPRESSION: No superimposed acute chest process. Stable right upper lobe perihilar masslike opacity Electronically Signed   By: Jerilynn Mages.  Shick M.D.   On: 01/07/2021  10:58    EKG: Independently reviewed. Sinus tach, no st elevations  Assessment/Plan UTI     - admit to inpt, tele     - continue rocephin     - follow UCx  Multiple falls Generalized weakness     - likely secondary to infection     - no LOC, head injury     - treat infection, have PT/OT also review      Non-small cell  lung cancer w/ mets to brain     - follows w/ Dr. Lorenso Courier     - last chemo on 01/04/21     - ANC is good     - continue outpt follow up  Normocytic anemia     - no evidence of bleed, follow  DM2     - DM diet, glucose check, SSI, A1c  HTN     - BP is soft, will hold BP meds for right now     - will have PRN available     - reassess in AM for resuming BP meds  GERD     - protonix  Seizure d/o     - continue keppra  DVT prophylaxis: SCDs  Code Status: FULL  Family Communication: spoke with sister by phone Consults called: None   Status is: Observation  The patient remains OBS appropriate and will d/c before 2 midnights.  Dispo: The patient is from: Home              Anticipated d/c is to: Home              Patient currently is not medically stable to d/c.   Difficult to place patient No  Time spent coordinating admission: 60 minutes  [facility]  If 7PM-7AM, please contact night-coverage [email or web address]  01/07/2021, 2:26 PM

## 2021-01-07 NOTE — ED Provider Notes (Signed)
[facility] DEPT Provider Note   CSN: [id number] Arrival date & time: 01/07/21  9211     History CC:  Shaking, chills   Benjamin Willis. is a 75 y.o. 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  The patient reports he woke up yesterday feeling had no energy.  He reports he uncontrolled tremors in his arms and legs, and felt cold.  He denies headache.  He denies nausea or vomiting.  He denies numbness or weakness of the arms or legs.  He denies any diarrhea.  He denies dysuria or hematuria.  He denies any new cough or congestion.  HPI     Past Medical History:  Diagnosis Date   Allergy    Diabetes mellitus without complication ([place])    Type II   Glaucoma 10/16/2020   History of radiation therapy    [place]- SBRT 02/21/19-02/28/19, Rt Lung- IMRT 02/29/20-04/15/20 Dr. Gery Pray   History of radiation therapy 10/30/2020   whole brain 10/17/2020-10/30/2020 Dr Gery Pray   Hyperlipidemia    Hypertension    Lung cancer ([place])    Stage IA2 (cT1b, N0) non-small cell lung cancer (adenocarcinoma)    Patient Active Problem List   Diagnosis Date Noted   SIRS (systemic inflammatory response syndrome) ([place]) 01/07/2021   UTI (urinary tract infection) 01/07/2021   Port-A-Cath in place 12/14/2020   Brain metastases [facility])    Palliative care by specialist    Metastatic lung cancer (metastasis from lung to other site) [facility]) 10/16/2020   Diabetes mellitus type 2 in nonobese ([place]) 10/16/2020   Glaucoma 10/16/2020   Lung cancer metastatic to brain ([place]) 10/16/2020   Atherosclerosis of aorta ([place]) 05/31/2020   Primary cancer of right upper lobe of lung ([place]) 02/07/2019   Lung nodule 02/02/2019   Hypertension associated with type 2 diabetes  mellitus ([place]) 05/24/2018   Dyslipidemia 05/24/2018   Dyslipidemia associated with type 2 diabetes mellitus ([place]) 06/09/2017    Past Surgical History:  Procedure Laterality Date   ELECTROMAGNETIC NAVIGATION BROCHOSCOPY Right 02/02/2019   Procedure: VIDEO BRONCHOSCOPY WITH NAVIGATION AND FIDUCIAL PLACEMENT;  Surgeon: Garner Nash, DO;  Location: [place];  Service: Cardiopulmonary;  Laterality: Right;   FUDUCIAL PLACEMENT Right 02/02/2019   Procedure: Placement Of Fuducial Right Upper Lobe;  Surgeon: Garner Nash, DO;  Location: [place];  Service: Cardiopulmonary;  Laterality: Right;   IR IMAGING GUIDED PORT INSERTION  12/13/2020   none     VIDEO BRONCHOSCOPY WITH RADIAL ENDOBRONCHIAL ULTRASOUND Right 02/02/2019   Procedure: Video Bronchoscopy With Radial Endobronchial Ultrasound;  Surgeon: Garner Nash, DO;  Location: [place];  Service: Cardiopulmonary;  Laterality: Right;       Family History  Problem Relation Age of Onset   Lung cancer Neg Hx     Social History   Tobacco Use   Smoking status: Former    Packs/day: 1.00    Years: 48.00    Pack years: 48.00    Types: Cigarettes    Quit date: 02/20/2015    Years since quitting: 5.8   Smokeless tobacco: Former    Quit date: 10/2013  Vaping Use   Vaping Use: Never used  Substance Use Topics   Alcohol use: No   Drug use: No    Home Medications Prior  to Admission medications   Medication Sig Start Date End Date Taking? Authorizing Provider  acetaminophen (TYLENOL) 500 MG tablet Take 1,000 mg by mouth every 6 (six) hours as needed for mild pain.   Yes [provider]  albuterol (VENTOLIN HFA) 108 (90 Base) MCG/ACT inhaler Inhale 2 puffs into the lungs every 6 (six) hours as needed for wheezing or shortness of breath. 01/19/19  Yes Icard, Bradley L, DO  amLODipine (NORVASC) 10 MG tablet Take 1 tablet (10 mg total) by mouth daily. 12/06/19  Yes Sagardia, Ines Bloomer, MD  cetirizine (ZYRTEC) 10 MG tablet Take 1 tablet  (10 mg total) by mouth daily. Patient taking differently: Take 10 mg by mouth daily as needed for allergies. 09/03/17  Yes Jaynee Eagles, PA-C  folic acid (FOLVITE) 1 MG tablet Take 1 tablet (1 mg total) by mouth daily. 11/30/20  Yes Orson Slick, MD  gabapentin (NEURONTIN) 300 MG capsule Take 1 capsule (300 mg total) by mouth at bedtime. 12/19/20 03/19/21 Yes Sagardia, Ines Bloomer, MD  glipiZIDE (GLUCOTROL) 5 MG tablet TAKE 1 TABLET BY MOUTH TWICE DAILY WITH A MEAL Patient taking differently: Take 5 mg by mouth 2 (two) times daily before a meal. 10/08/20  Yes Sagardia, Ines Bloomer, MD  latanoprost (XALATAN) 0.005 % ophthalmic solution Place 1 drop into both eyes at bedtime. 01/24/20  Yes [provider]  levETIRAcetam (KEPPRA) 1000 MG tablet Take 1 tablet (1,000 mg total) by mouth 2 (two) times daily. 12/19/20 03/19/21 Yes Sagardia, Ines Bloomer, MD  lidocaine-prilocaine (EMLA) cream Apply 1 application topically as needed. Patient taking differently: Apply 1 application topically as needed (for port before tx). 11/30/20  Yes Orson Slick, MD  lisinopril (ZESTRIL) 40 MG tablet Take 1 tablet (40 mg total) by mouth daily. 12/06/19 01/07/21 Yes Sagardia, Ines Bloomer, MD  metFORMIN (GLUCOPHAGE) 500 MG tablet TAKE 1 TABLET BY MOUTH TWICE DAILY WITH MEALS Patient taking differently: Take 500 mg by mouth daily with breakfast. 11/22/20  Yes Sagardia, Ines Bloomer, MD  Multiple Vitamins-Minerals (MULTIVITAMIN MEN) TABS Take 1 tablet by mouth daily.   Yes [provider]  ondansetron (ZOFRAN) 8 MG tablet Take 1 tablet (8 mg total) by mouth every 8 (eight) hours as needed for nausea or vomiting. 11/30/20  Yes Orson Slick, MD  prochlorperazine (COMPAZINE) 10 MG tablet Take 1 tablet (10 mg total) by mouth every 6 (six) hours as needed for nausea or vomiting. 11/30/20  Yes Orson Slick, MD  sitaGLIPtin (JANUVIA) 100 MG tablet Take 1 tablet (100 mg total) by mouth daily. 05/31/20 01/07/21 Yes  Sagardia, Ines Bloomer, MD  blood glucose meter kit and supplies KIT Dispense based on patient and insurance preference. Use up to four times daily as directed. 10/23/20   Elodia Florence., MD  Blood Pressure Monitoring (BLOOD PRESSURE MONITOR/ARM) DEVI Check BP once a day. DX I 10 04/18/19   Horald Pollen, MD  pantoprazole (PROTONIX) 40 MG tablet Take 1 tablet by mouth once daily Patient not taking: Reported on 01/07/2021 12/19/20   Horald Pollen, MD    Allergies    Patient has no known allergies.  Review of Systems   Review of Systems  Constitutional:  Positive for chills. Negative for fever.  HENT:  Negative for ear pain and sore throat.   Eyes:  Negative for pain and visual disturbance.  Respiratory:  Negative for cough and shortness of breath.   Cardiovascular:  Negative for chest  pain and palpitations.  Gastrointestinal:  Negative for abdominal pain and vomiting.  Genitourinary:  Negative for difficulty urinating and dysuria.  Musculoskeletal:  Negative for arthralgias and back pain.  Skin:  Negative for color change and rash.  Neurological:  Negative for dizziness, syncope, weakness, light-headedness, numbness and headaches.  All other systems reviewed and are negative.  Physical Exam Updated Vital Signs BP (!) 140/93 (BP Location: Right Arm)   Pulse 84   Temp 98.4 F (36.9 C) (Oral)   Resp 17   Ht 6' (1.829 m)   Wt 72.6 kg   SpO2 96%   BMI 21.70 kg/m   Physical Exam Constitutional:      General: He is not in acute distress.    Comments: Thin  HENT:     Head: Normocephalic and atraumatic.  Eyes:     Conjunctiva/sclera: Conjunctivae normal.     Pupils: Pupils are equal, round, and reactive to light.  Cardiovascular:     Rate and Rhythm: Normal rate and regular rhythm.  Pulmonary:     Effort: Pulmonary effort is normal. No respiratory distress.  Abdominal:     General: There is no distension.     Tenderness: There is no abdominal tenderness.   Skin:    General: Skin is warm and dry.  Neurological:     General: No focal deficit present.     Mental Status: He is alert and oriented to person, place, and time. Mental status is at baseline.     Sensory: No sensory deficit.     Motor: No weakness.  Psychiatric:        Mood and Affect: Mood normal.        Behavior: Behavior normal.    ED Results / Procedures / Treatments   Labs (all labs ordered are listed, but only abnormal results are displayed) Labs Reviewed  COMPREHENSIVE METABOLIC PANEL - Abnormal; Notable for the following components:      Result Value   Calcium 8.6 (*)    Total Protein 5.7 (*)    Albumin 3.2 (*)    All other components within normal limits  CBC WITH DIFFERENTIAL/PLATELET - Abnormal; Notable for the following components:   WBC 2.2 (*)    RBC 3.24 (*)    Hemoglobin 9.9 (*)    HCT 30.8 (*)    Lymphs Abs 0.1 (*)    Monocytes Absolute 0.0 (*)    Abs Immature Granulocytes 0.25 (*)    All other components within normal limits  URINALYSIS, ROUTINE W REFLEX MICROSCOPIC - Abnormal; Notable for the following components:   Color, Urine YELLOW (*)    APPearance HAZY (*)    Hgb urine dipstick MODERATE (*)    Ketones, ur 15 (*)    Protein, ur 30 (*)    Nitrite POSITIVE (*)    Leukocytes,Ua TRACE (*)    Bacteria, UA RARE (*)    All other components within normal limits  RESP PANEL BY RT-PCR (FLU A&B, COVID) ARPGX2  URINE CULTURE  CULTURE, BLOOD (ROUTINE X 2)  CULTURE, BLOOD (ROUTINE X 2)  LACTIC ACID, PLASMA  PROTIME-INR  APTT  GLUCOSE, CAPILLARY  HEMOGLOBIN A1C  COMPREHENSIVE METABOLIC PANEL  CBC    EKG EKG Interpretation  Date/Time:  Monday January 07 2021 10:42:23 EDT Ventricular Rate:  105 PR Interval:  123 QRS Duration: 84 QT Interval:  325 QTC Calculation: 430 R Axis:   59 Text Interpretation: Sinus tachycardia Anterior infarct, old Confirmed by Octaviano Glow [phone]) on 01/07/2021  11:01:53 AM  Radiology DG Chest Port 1  View  Result Date: 01/07/2021 CLINICAL DATA:  Weakness, concern for sepsis, lung cancer EXAM: PORTABLE CHEST 1 VIEW COMPARISON:  10/15/2020, 10/16/2020 FINDINGS: Right IJ power port catheter tip mid SVC level. Stable right upper lobe perihilar masslike opacity with radiopaque markers. No new superimposed airspace process, collapse or consolidation. No large effusion or pneumothorax. Trachea midline. Normal heart size and vascularity. IMPRESSION: No superimposed acute chest process. Stable right upper lobe perihilar masslike opacity Electronically Signed   By: Jerilynn Mages.  Shick M.D.   On: 01/07/2021 10:58    Procedures .Critical Care  Date/Time: 01/07/2021 10:35 AM Performed by: Wyvonnia Dusky, MD Authorized by: Wyvonnia Dusky, MD   Critical care provider statement:    Critical care time (minutes):  45   Critical care was necessary to treat or prevent imminent or life-threatening deterioration of the following conditions:  Sepsis   Critical care was time spent personally by me on the following activities:  Discussions with consultants, evaluation of patient's response to treatment, examination of patient, ordering and performing treatments and interventions, ordering and review of laboratory studies, ordering and review of radiographic studies, pulse oximetry, re-evaluation of patient's condition, obtaining history from patient or surrogate and review of old charts   Medications Ordered in ED Medications  prochlorperazine (COMPAZINE) tablet 10 mg (has no administration in time range)  folic acid (FOLVITE) tablet 1 mg (has no administration in time range)  gabapentin (NEURONTIN) capsule 300 mg (has no administration in time range)  levETIRAcetam (KEPPRA) tablet 1,000 mg (has no administration in time range)  multivitamin with minerals tablet 1 tablet (has no administration in time range)  latanoprost (XALATAN) 0.005 % ophthalmic solution 1 drop (has no administration in time range)  0.9 %  sodium  chloride infusion (has no administration in time range)  acetaminophen (TYLENOL) tablet 650 mg (has no administration in time range)    Or  acetaminophen (TYLENOL) suppository 650 mg (has no administration in time range)  hydrALAZINE (APRESOLINE) injection 10 mg (has no administration in time range)  cefTRIAXone (ROCEPHIN) 1 g in sodium chloride 0.9 % 100 mL IVPB (has no administration in time range)  insulin aspart (novoLOG) injection 0-9 Units (has no administration in time range)  insulin aspart (novoLOG) injection 0-5 Units (has no administration in time range)  albuterol (PROVENTIL) (2.5 MG/3ML) 0.083% nebulizer solution 2.5 mg (has no administration in time range)  lactated ringers bolus 2,000 mL (2,000 mLs Intravenous New Bag/Given 01/07/21 1122)  acetaminophen (TYLENOL) tablet 1,000 mg (1,000 mg Oral Given 01/07/21 1122)  cefTRIAXone (ROCEPHIN) 2 g in sodium chloride 0.9 % 100 mL IVPB (2 g Intravenous New Bag/Given 01/07/21 1445)    ED Course  I have reviewed the triage vital signs and the nursing notes.  Pertinent labs & imaging results that were available during my care of the patient were reviewed by me and considered in my medical decision making (see chart for details).  This patient complains of fever, fatigue, weakness.  This involves an extensive number of treatment options, and is a complaint that carries with it a high risk of complications and morbidity.  The differential diagnosis includes sepsis versus anemia versus dehydration versus other.  I ordered, reviewed, and interpreted labs.  These were significant for leukopenia with white blood cell count of 2.2.  UA strongly suggestive of UTI with positive nitrates and leukocytes.  Kidney function at baseline is 0.9.  COVID-negative.  Blood cultures pending. I ordered  medication IV Rocephin, IV fluid bolus, p.o. Tylenol for sepsis and suspected UTI I ordered imaging studies which included x-ray of the chest I independently  visualized and interpreted imaging which showed no life-threatening abnormalities, and the monitor tracing which showed sinus rhythm   After the interventions stated above, I reevaluated the patient and found patient mentating well, blood pressure stable.  Lactate also normal on arrival.  I doubt shock.  I believe he is on appropriate antibiotics for urosepsis.  Okay for admission   Clinical Course as of 01/07/21 [place] Jan 07, 2021  1409 Will page for admission to the hospitalist for sepsis rule out. [MT]  1427 Admitted to hospitalist [MT]    Clinical Course User Index [MT] Wyvonnia Dusky, MD    Final Clinical Impression(s) / ED Diagnoses Final diagnoses:  Sepsis, due to unspecified organism, unspecified whether acute organ dysfunction present [facility])  Urinary tract infection without hematuria, site unspecified    Rx / DC Orders ED Discharge Orders     None        Wyvonnia Dusky, MD 01/07/21 [phone]

## 2021-01-08 ENCOUNTER — Ambulatory Visit: Payer: Medicare Other

## 2021-01-08 DIAGNOSIS — N390 Urinary tract infection, site not specified: Secondary | ICD-10-CM | POA: Diagnosis not present

## 2021-01-08 LAB — COMPREHENSIVE METABOLIC PANEL
ALT: 25 U/L (ref 0–44)
AST: 38 U/L (ref 15–41)
Albumin: 2.7 g/dL — ABNORMAL LOW (ref 3.5–5.0)
Alkaline Phosphatase: 52 U/L (ref 38–126)
Anion gap: 8 (ref 5–15)
BUN: 14 mg/dL (ref 8–23)
CO2: 26 mmol/L (ref 22–32)
Calcium: 8.6 mg/dL — ABNORMAL LOW (ref 8.9–10.3)
Chloride: 106 mmol/L (ref 98–111)
Creatinine, Ser: 0.67 mg/dL (ref 0.61–1.24)
GFR, Estimated: >60 mL/min (ref >60)
Glucose, Bld: 97 mg/dL (ref 70–99)
Potassium: 3.4 mmol/L — ABNORMAL LOW (ref 3.5–5.1)
Sodium: 140 mmol/L (ref 135–145)
Total Bilirubin: 0.8 mg/dL (ref 0.3–1.2)
Total Protein: 5.4 g/dL — ABNORMAL LOW (ref 6.5–8.1)

## 2021-01-08 LAB — CBC
HCT: 30.2 % — ABNORMAL LOW (ref 39.0–52.0)
Hemoglobin: 9.9 g/dL — ABNORMAL LOW (ref 13.0–17.0)
MCH: 30.9 pg (ref 26.0–34.0)
MCHC: 32.8 g/dL (ref 30.0–36.0)
MCV: 94.4 fL (ref 80.0–100.0)
Platelets: 132 10*3/uL — ABNORMAL LOW (ref 150–400)
RBC: 3.2 MIL/uL — ABNORMAL LOW (ref 4.22–5.81)
RDW: 13.5 % (ref 11.5–15.5)
WBC: 3 10*3/uL — ABNORMAL LOW (ref 4.0–10.5)
nRBC: 0 % (ref 0.0–0.2)

## 2021-01-08 LAB — GLUCOSE, CAPILLARY
Glucose-Capillary: 82 mg/dL (ref 70–99)
Glucose-Capillary: 108 mg/dL — ABNORMAL HIGH (ref 70–99)
Glucose-Capillary: 112 mg/dL — ABNORMAL HIGH (ref 70–99)
Glucose-Capillary: 106 mg/dL — ABNORMAL HIGH (ref 70–99)

## 2021-01-08 MED ORDER — AMLODIPINE BESYLATE 10 MG PO TABS
10.0000 mg | ORAL_TABLET | Freq: Every day | ORAL | Status: DC
  Administered 2021-01-08 – 2021-01-10 (×3): 10 mg via ORAL
  Filled 2021-01-08 (×3): qty 1

## 2021-01-08 MED ORDER — ORAL CARE MOUTH RINSE
15.0000 mL | Freq: Two times a day (BID) | OROMUCOSAL | Status: DC
  Administered 2021-01-08 – 2021-01-10 (×5): 15 mL via OROMUCOSAL

## 2021-01-08 MED ORDER — POTASSIUM CHLORIDE CRYS ER 20 MEQ PO TBCR
40.0000 meq | EXTENDED_RELEASE_TABLET | Freq: Once | ORAL | Status: AC
  Administered 2021-01-08: 40 meq via ORAL
  Filled 2021-01-08: qty 2

## 2021-01-08 NOTE — Evaluation (Addendum)
Physical Therapy Evaluation Patient Details Name: Benjamin Willis. MRN: [id number] DOB: 1945-06-30 Today's Date: 01/08/2021   History of Present Illness  Patient is a 75 year old male admitted with multiple falls at home, increased weakness and poor appetite. Pt diagnosed with UTI. PMH includes non-small cell lung cancer w/ brain mets, HTN, DM2  Clinical Impression  Pt admitted with above diagnosis. Pt ambulated 120' with RW with min/guard assist for safety 2* recent fall. No loss of balance. Pt is oriented to self (can state name but only day/year of birthdate), not oriented to current month/year. He can follow 1 step commands and puts forth good effort. Pt currently with functional limitations due to the deficits listed below (see PT Problem List). Pt will benefit from skilled PT to increase their independence and safety with mobility to allow discharge to the venue listed below.       Follow Up Recommendations Home health PT;Supervision for mobility/OOB 2* confusion    Equipment Recommendations  Rolling walker with 5" wheels    Recommendations for Other Services       Precautions / Restrictions Precautions Precautions: Fall Precaution Comments: per family has had at least 2 falls in past few days Restrictions Weight Bearing Restrictions: No      Mobility  Bed Mobility Overal bed mobility: Modified Independent             General bed mobility comments: increased time but able to self mobilize from bed with HOB elevated    Transfers Overall transfer level: Needs assistance Equipment used: Rolling walker (2 wheeled) Transfers: Sit to/from Stand Sit to Stand: Min guard         General transfer comment: min G for safety to power up to standing and cues to push from bed.  Ambulation/Gait Ambulation/Gait assistance: Min guard Gait Distance (Feet): 120 Feet Assistive device: Rolling walker (2 wheeled) Gait Pattern/deviations: Step-through pattern;Decreased stride  length;Trunk flexed Gait velocity: decr   General Gait Details: VCs for positioning in RW, no loss of balance  Stairs            Wheelchair Mobility    Modified Rankin (Stroke Patients Only)       Balance Overall balance assessment: History of Falls;Needs assistance Sitting-balance support: Feet supported Sitting balance-Leahy Scale: Good     Standing balance support: Bilateral upper extremity supported Standing balance-Leahy Scale: Poor Standing balance comment: reliant on external support                             Pertinent Vitals/Pain Pain Assessment: No/denies pain    Home Living Family/patient expects to be discharged to:: Private residence Living Arrangements: Other relatives (sister) Available Help at Discharge: Family;Available 24 hours/day Type of Home: House Home Access: Stairs to enter   [employer] of Steps: 3 Home Layout: Two level;Able to live on main level with bedroom/bathroom Home Equipment: Kasandra Knudsen - single point;Walker - 2 wheels;Bedside commode;Grab bars - tub/shower      Prior Function Level of Independence: Independent with assistive device(s)         Comments: ambulates with cane     Hand Dominance   Dominant Hand: Right    Extremity/Trunk Assessment   Upper Extremity Assessment Upper Extremity Assessment: Defer to OT evaluation    Lower Extremity Assessment Lower Extremity Assessment: Overall WFL for tasks assessed    Cervical / Trunk Assessment Cervical / Trunk Assessment: Normal  Communication   Communication: No difficulties  Cognition Arousal/Alertness: Awake/alert Behavior During Therapy: WFL for tasks assessed/performed Overall Cognitive Status: No family/caregiver present to determine baseline cognitive functioning                                 General Comments: oriented to self, could only recall day and year of his birthday but not month, not oriented to current  month/year, able to follow 1 step commands, knows he's in a hospital in [place]     Assessment/Plan    PT Assessment Patient needs continued PT services  PT Problem List Decreased mobility;Decreased activity tolerance;Decreased balance;Decreased cognition       PT Treatment Interventions Gait training;DME instruction;Functional mobility training;Therapeutic exercise;Therapeutic activities;Patient/family education    PT Goals (Current goals can be found in the Care Plan section)  Acute Rehab PT Goals Patient Stated Goal: likes to be outside PT Goal Formulation: With patient Time For Goal Achievement: 01/22/21 Potential to Achieve Goals: Fair    Frequency Min 3X/week   Barriers to discharge        Co-evaluation               AM-PAC PT "6 Clicks" Mobility  Outcome Measure Help needed turning from your back to your side while in a flat bed without using bedrails?: None Help needed moving from lying on your back to sitting on the side of a flat bed without using bedrails?: A Little Help needed moving to and from a bed to a chair (including a wheelchair)?: A Little Help needed standing up from a chair using your arms (e.g., wheelchair or bedside chair)?: A Little Help needed to walk in hospital room?: A Little Help needed climbing 3-5 steps with a railing? : A Little 6 Click Score: 19    End of Session Equipment Utilized During Treatment: Gait belt Activity Tolerance: Patient tolerated treatment well Patient left: in chair;with call bell/phone within reach;with chair alarm set Nurse Communication: Mobility status PT Visit Diagnosis: Unsteadiness on feet (R26.81);History of falling (Z91.81)    Time: 7517-0017 PT Time Calculation (min) (ACUTE ONLY): 18 min   Charges:   PT Evaluation $PT Eval Moderate Complexity: 1 Mod     Philomena Doheny PT 01/08/2021  Acute Rehabilitation Services Pager [phone] Office  [phone]

## 2021-01-08 NOTE — Evaluation (Signed)
Occupational Therapy Evaluation Patient Details Name: Benjamin Willis. MRN: [id number] DOB: [birth year] Today's Date: 01/08/2021    History of Present Illness Patient is a 75 year old male admitted with multiple falls at home, increased weakness and poor appetite. Pt diagnosed with UTI. 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 At baseline patient reports does not need assist for basic ADL tasks and ambulates with a cane. States when he fell other day did not have cane with him and he just "gave out." Unsure of baseline mentation however patient having difficulty sequencing finger opposition task with multimodal cues, unable to state month/year without assistance and "I know I'm in the hospital" but cannot state which one. Overall patient needing min A for functional transfers, mobility and standing ADLs due to poor balance and safety awareness. Needs cues not to pull on walker to stand and to reach back when sitting into recliner chair with poor eccentric control. Acute OT to follow to work on balance and safety awareness to minimize falls during daily routine and anticipate return home with family and [facility] services.     Follow Up Recommendations  Home health OT;Supervision/Assistance - 24 hour    Equipment Recommendations  None recommended by OT       Precautions / Restrictions Precautions Precautions: Fall Precaution Comments: per family has had at least 2 falls in past few days Restrictions Weight Bearing Restrictions: No      Mobility Bed Mobility Overal bed mobility: Modified Independent             General bed mobility comments: increased time but able to self mobilize from bed with HOB elevated    Transfers Overall transfer level: Needs assistance Equipment used: Rolling walker (2 wheeled) Transfers: Sit to/from [employer] Sit to Stand: Min guard Stand pivot transfers: Min assist       General transfer comment: min G for safety to power up to standing and cues to push from bed. Min A to complete stand pivot to recliner chair as patient is unsteady and poor safety awareness with walker sitting before fully turned to chair and does not initiate reaching back    Balance Overall balance assessment: History of Falls;Needs assistance Sitting-balance support: Feet supported Sitting balance-Leahy Scale: Good     Standing balance support: Bilateral upper extremity supported Standing balance-Leahy Scale: Poor Standing balance comment: reliant on external support                           ADL either performed or assessed with clinical judgement   ADL Overall ADL's : Needs assistance/impaired     Grooming: Set up;Sitting   Upper Body Bathing: Set up;Sitting   Lower Body Bathing: Minimal assistance;Sit to/from stand   Upper Body Dressing : Set up;Sitting   Lower Body Dressing: Set up;Minimal assistance;Sitting/lateral leans;Sit to/from stand Lower Body Dressing Details (indicate cue type and reason): seated at edge of bed patient is able to pull up socks, in standing need min A due to poor dynamic standing balance and safety awareness Toilet Transfer: Minimal assistance;Cueing for safety;Cueing for sequencing;Stand-pivot;RW Toilet Transfer Details (indicate cue type and reason): to recliner, poor safety with hand placement, sits quickly into chair with poor eccentric control and does not attempt to reach back when sitting. Toileting- Clothing Manipulation and  Hygiene: Minimal assistance;Sit to/from stand       Functional mobility during ADLs: Minimal assistance;Rolling walker;Cueing for sequencing;Cueing for safety        Pertinent Vitals/Pain Pain Assessment: Faces Faces Pain Scale: No hurt     Hand Dominance Right   Extremity/Trunk Assessment Upper Extremity  Assessment Upper Extremity Assessment: Overall WFL for tasks assessed   Lower Extremity Assessment Lower Extremity Assessment: Defer to PT evaluation       Communication Communication Communication: No difficulties   Cognition Arousal/Alertness: Awake/alert Behavior During Therapy: WFL for tasks assessed/performed Overall Cognitive Status: No family/caregiver present to determine baseline cognitive functioning                                 General Comments: patient states knows hes in hospital but cannot recall name of it, cannot name month/year without assist. difficulty sequencing fine motor coordination task of finger opposition despite multimodal cues. unsure of baseline mentation does have brain mets.              Home Living Family/patient expects to be discharged to:: Private residence Living Arrangements: Other relatives (sister) Available Help at Discharge: Family;Available 24 hours/day Type of Home: House Home Access: Stairs to enter [employer] of Steps: 3   Home Layout: Two level;Able to live on main level with bedroom/bathroom     Bathroom Shower/Tub: Teacher, early years/pre: Standard     Home Equipment: Cane - single point;Walker - 2 wheels;Bedside commode;Grab bars - tub/shower          Prior Functioning/Environment Level of Independence: Independent with assistive device(s)        Comments: ambulates with cane        OT Problem List: Decreased safety awareness;Impaired balance (sitting and/or standing);Decreased activity tolerance      OT Treatment/Interventions: Self-care/ADL training;Therapeutic activities;Patient/family education;Balance training;Cognitive remediation/compensation    OT Goals(Current goals can be found in the care plan section) Acute Rehab OT Goals Patient Stated Goal: "how do I watch TV" OT Goal Formulation: With patient Time For Goal Achievement: 01/22/21 Potential to Achieve  Goals: Good  OT Frequency: Min 2X/week    AM-PAC OT "6 Clicks" Daily Activity     Outcome Measure Help from another person eating meals?: None Help from another person taking care of personal grooming?: A Little Help from another person toileting, which includes using toliet, bedpan, or urinal?: A Little Help from another person bathing (including washing, rinsing, drying)?: A Little Help from another person to put on and taking off regular upper body clothing?: A Little Help from another person to put on and taking off regular lower body clothing?: A Little 6 Click Score: 19   End of Session Equipment Utilized During Treatment: Rolling walker Nurse Communication: Mobility status  Activity Tolerance: Patient tolerated treatment well Patient left: in chair;with call bell/phone within reach;with chair alarm set  OT Visit Diagnosis: History of falling (Z91.81)                Time: 1610-9604 OT Time Calculation (min): 19 min Charges:  OT General Charges $OT Visit: 1 Visit OT Evaluation $OT Eval Low Complexity: [place] OT OT pager: [place] 01/08/2021, 8:55 AM

## 2021-01-08 NOTE — Progress Notes (Signed)
[facility]  PROGRESS NOTE  Karem Florina Ou. IRC:[id number] DOB: 07-29-1945 DOA: 01/07/2021 PCP: Horald Pollen, MD   Brief HPI:   75 year old male with medical history of non-small cell lung cancer with brain metastasis, hypertension, diabetes mellitus type 2 presented with multiple falls, weakness.  No history of loss of consciousness.  Family found that patient was shivering in the bed.  Family was concerned that he was having a seizure so he was brought to the ED. In the ED was found to have abnormal UA, started on Rocephin.    Subjective   Patient seen and examined, denies any complaints today.  Urine culture growing E. coli.   Assessment/Plan:     UTI -Urine culture growing E. Coli -Continue IV ceftriaxone -Follow final urine culture results  Multiple falls -Unclear etiology; likely from underlying brain metastasis -Likely exacerbated by UTI as above -No history of loss of consciousness -Will treat for UTI -PT/OT consulted  Non-small cell lung cancer with metastasis to brain -Follows oncology as outpatient with Dr. Lorenso Courier -Last chemotherapy was on 01/04/2021 -Continue follow-up with oncology as outpatient  Normocytic anemia -Hemoglobin stable at 10.9  Diabetes mellitus type 2 -Continue sliding scale insulin with NovoLog -CBG well controlled  Hypertension -Blood pressure is stable -We will restart amlodipine 10 mg daily -We will hold lisinopril 40 mg at this time, will discontinue lisinopril if patient blood pressure is stable with amlodipine  Seizure disorder -Continue Keppra  Hypokalemia -Potassium was 3.4 -Replace potassium and follow BMP in am  Scheduled medications:    folic acid  1 mg Oral Daily   gabapentin  300 mg Oral QHS   insulin aspart  0-5 Units Subcutaneous QHS   insulin aspart  0-9 Units Subcutaneous TID WC   latanoprost  1 drop Both Eyes QHS   levETIRAcetam  1,000 mg Oral BID   mouth rinse  15 mL Mouth Rinse BID    multivitamin with minerals  1 tablet Oral Daily         Data Reviewed:   CBG:  Recent Labs  Lab 01/07/21 1636 01/07/21 2043 01/08/21 0752 01/08/21 1157 01/08/21 1641  GLUCAP 75 89 82 108* 112*    SpO2: 96 %    Vitals:   01/07/21 2041 01/08/21 0001 01/08/21 0519 01/08/21 1324  BP: 140/86 (!) 141/93 (!) 146/90 (!) 145/87  Pulse: 79 77 81 79  Resp: 14 16 16 16   Temp: 97.8 F (36.6 C) 97.9 F (36.6 C) 97.7 F (36.5 C) 98.1 F (36.7 C)  TempSrc: Oral Oral Oral Oral  SpO2: 94% 95% 93% 96%  Weight:      Height:         Intake/Output Summary (Last 24 hours) at 01/08/2021 1900 Last data filed at 01/08/2021 1527 Gross per 24 hour  Intake 2139.25 ml  Output 400 ml  Net 1739.25 ml    09/04 1901 - 09/06 0700 In: 1586.3 [P.O.:600; I.V.:986.3] Out: 400 [Urine:400]  Filed Weights   01/07/21 0958  Weight: 72.6 kg    CBC:  Recent Labs  Lab 01/04/21 1005 01/07/21 1115 01/08/21 0520  WBC 3.1* 2.2* 3.0*  HGB 11.1* 9.9* 9.9*  HCT 32.8* 30.8* 30.2*  PLT 304 166 132*  MCV 92.9 95.1 94.4  MCH 31.4 30.6 30.9  MCHC 33.8 32.1 32.8  RDW 13.3 13.7 13.5  LYMPHSABS 0.6* 0.1*  --   MONOABS 0.5 0.0*  --   EOSABS 0.0 0.0  --   BASOSABS 0.0 0.0  --  Complete metabolic panel:  Recent Labs  Lab 01/04/21 1005 01/07/21 1115 01/08/21 0520  NA 146* 142 140  K 3.9 3.5 3.4*  CL 110 110 106  CO2 26 25 26   GLUCOSE 78 77 97  BUN 11 22 14   CREATININE 0.70 0.95 0.67  CALCIUM 9.4 8.6* 8.6*  AST 21 41 38  ALT 24 28 25   ALKPHOS 74 72 52  BILITOT 0.5 1.0 0.8  ALBUMIN 3.8 3.2* 2.7*  LATICACIDVEN  --  1.0  --   INR  --  1.1  --   HGBA1C  --  8.6*  --     No results for input(s): LIPASE, AMYLASE in the last 168 hours.  Recent Labs  Lab 01/07/21 1115  [place]    ------------------------------------------------------------------------------------------------------------------ No results for input(s): CHOL, HDL, LDLCALC, TRIG, CHOLHDL, LDLDIRECT in the  last 72 hours.  Lab Results  Component Value Date   HGBA1C 8.6 (H) 01/07/2021   ------------------------------------------------------------------------------------------------------------------ No results for input(s): TSH, T4TOTAL, T3FREE, THYROIDAB in the last 72 hours.  Invalid input(s): FREET3 ------------------------------------------------------------------------------------------------------------------ No results for input(s): VITAMINB12, FOLATE, FERRITIN, TIBC, IRON, RETICCTPCT in the last 72 hours.  Coagulation profile Recent Labs  Lab 01/07/21 1115  INR 1.1   No results for input(s): DDIMER in the last 72 hours.  Cardiac Enzymes No results for input(s): CKTOTAL, CKMB, CKMBINDEX, TROPONINI in the last 168 hours.  ------------------------------------------------------------------------------------------------------------------ No results found for: BNP   Antibiotics: Anti-infectives (From admission, onward)    Start     Dose/Rate Route Frequency Ordered Stop   01/08/21 1400  cefTRIAXone (ROCEPHIN) 1 g in sodium chloride 0.9 % 100 mL IVPB        1 g 200 mL/hr over 30 Minutes Intravenous Every 24 hours 01/07/21 1617     01/07/21 1430  vancomycin (VANCOREADY) IVPB 1500 mg/300 mL  Status:  Discontinued        1,500 mg 150 mL/hr over 120 Minutes Intravenous  Once 01/07/21 1421 01/07/21 1426   01/07/21 1430  piperacillin-tazobactam (ZOSYN) IVPB 3.375 g  Status:  Discontinued        3.375 g 12.5 mL/hr over 240 Minutes Intravenous  Once 01/07/21 1421 01/07/21 1426   01/07/21 1430  cefTRIAXone (ROCEPHIN) 2 g in sodium chloride 0.9 % 100 mL IVPB        2 g 200 mL/hr over 30 Minutes Intravenous  Once 01/07/21 1426 01/07/21 1515        Radiology Reports  DG Chest Port 1 View  Result Date: 01/07/2021 CLINICAL DATA:  Weakness, concern for sepsis, lung cancer EXAM: PORTABLE CHEST 1 VIEW COMPARISON:  10/15/2020, 10/16/2020 FINDINGS: Right IJ power port catheter tip mid  SVC level. Stable right upper lobe perihilar masslike opacity with radiopaque markers. No new superimposed airspace process, collapse or consolidation. No large effusion or pneumothorax. Trachea midline. Normal heart size and vascularity. IMPRESSION: No superimposed acute chest process. Stable right upper lobe perihilar masslike opacity Electronically Signed   By: Jerilynn Mages.  Shick M.D.   On: 01/07/2021 10:58      DVT prophylaxis: SCDs  Code Status: Full code  Family Communication: No family at bedside   Consultants:   Procedures:     Objective    Physical Examination:  General-appears in no acute distress Heart-S1-S2, regular, no murmur auscultated Lungs-clear to auscultation bilaterally, no wheezing or crackles auscultated Abdomen-soft, nontender, no organomegaly Extremities-no edema in the lower extremities Neuro-alert, oriented x3, no focal deficit noted  Status is: Inpatient  Dispo: The patient  is from: Home              Anticipated d/c is to: Home              Anticipated d/c date is: 01/10/2021              Patient currently not stable for discharge  Barrier to discharge-treatment for UTI, urine culture result pending  COVID-19 Labs  No results for input(s): DDIMER, FERRITIN, LDH, CRP in the last 72 hours.  Lab Results  Component Value Date   [place] NEGATIVE 01/07/2021   [place] NEGATIVE 10/16/2020   [place] NEGATIVE 10/11/2020   [place] NEGATIVE 01/31/2019    Microbiology  Recent Results (from the past 240 hour(s))  Blood Culture (routine x 2)     Status: None (Preliminary result)   Collection Time: 01/07/21 11:05 AM   Specimen: BLOOD  Result Value Ref Range Status   Specimen Description   Final    BLOOD RIGHT ANTECUBITAL Performed at [facility] [address] [place], [place] [address]    Special Requests   Final    BOTTLES DRAWN AEROBIC AND ANAEROBIC Blood Culture adequate volume Performed at [facility] [address], [place], [place] [address]    Culture   Final    NO GROWTH < 24 HOURS Performed at [facility] [address] [place], [place] [address]    Report Status PENDING  Incomplete  Resp Panel by RT-PCR (Flu A&B, Covid) Nasopharyngeal Swab     Status: None   Collection Time: 01/07/21 11:15 AM   Specimen: Nasopharyngeal Swab; Nasopharyngeal(NP) swabs in vial transport medium  Result Value Ref Range Status   SARS Coronavirus 2 by RT PCR NEGATIVE NEGATIVE Final    Comment: (NOTE) SARS-CoV-2 target nucleic acids are NOT DETECTED.  The SARS-CoV-2 RNA is generally detectable in upper respiratory specimens during the acute phase of infection. The lowest concentration of SARS-CoV-2 viral copies this assay can detect is 138 copies/mL. A negative result does not preclude SARS-Cov-2 infection and should not be used as the sole basis for treatment or other patient management decisions. A negative result may occur with  improper specimen collection/handling, submission of specimen other than nasopharyngeal swab, presence of viral mutation(s) within the areas targeted by this assay, and inadequate number of viral copies(<138 copies/mL). A negative result must be combined with clinical observations, patient history, and epidemiological information. The expected result is Negative.  Fact Sheet for Patients:  EntrepreneurPulse.com.au  Fact Sheet for Healthcare Providers:  IncredibleEmployment.be  This test is no t yet approved or cleared by the [place] FDA and  has been authorized for detection and/or diagnosis of SARS-CoV-2 by FDA under an Emergency Use Authorization (EUA). This EUA will remain  in effect (meaning this test can be used) for the duration of the COVID-19 declaration under Section 564(b)(1) of the Act, 21 U.S.C.section 360bbb-3(b)(1), unless the authorization is terminated  or revoked sooner.        Influenza A by PCR NEGATIVE NEGATIVE Final   Influenza B by PCR NEGATIVE NEGATIVE Final    Comment: (NOTE) The Xpert Xpress SARS-CoV-2/FLU/RSV plus assay is intended as an aid in the diagnosis of influenza from Nasopharyngeal swab specimens and should not be used as a sole basis for treatment. Nasal washings and aspirates are unacceptable for Xpert Xpress SARS-CoV-2/FLU/RSV testing.  Fact Sheet for Patients: EntrepreneurPulse.com.au  Fact Sheet for Healthcare Providers: IncredibleEmployment.be  This test is not yet approved  or cleared by the [place] and has been authorized for detection and/or diagnosis of SARS-CoV-2 by FDA under an Emergency Use Authorization (EUA). This EUA will remain in effect (meaning this test can be used) for the duration of the COVID-19 declaration under Section 564(b)(1) of the Act, 21 U.S.C. section 360bbb-3(b)(1), unless the authorization is terminated or revoked.  Performed at [facility], [place] [address], [place], [place] [address]   Blood Culture (routine x 2)     Status: None (Preliminary result)   Collection Time: 01/07/21 11:16 AM   Specimen: BLOOD  Result Value Ref Range Status   Specimen Description   Final    BLOOD LEFT ANTECUBITAL Performed at [facility] [address], [place], [place] [address]    Special Requests   Final    BOTTLES DRAWN AEROBIC AND ANAEROBIC Blood Culture adequate volume Performed at [facility] [address], [place], [place] [address]    Culture   Final    NO GROWTH < 24 HOURS Performed at [facility] [address] [place], [place] [address]    Report Status PENDING  Incomplete  Urine Culture     Status: Abnormal (Preliminary result)   Collection Time: 01/07/21  1:22 PM   Specimen: In/Out Cath Urine  Result Value Ref Range Status   Specimen Description   Final    IN/OUT CATH  URINE Performed at [facility] [address], [place], [place] [address]    Special Requests   Final    Immunocompromised Performed at [facility], [place] [address], [place], [place] [address]    Culture (A)  Final    >=100,000 COLONIES/mL ESCHERICHIA COLI SUSCEPTIBILITIES TO FOLLOW Performed at [facility], [place] [address], [place], [place] [address]    Report Status PENDING  Incomplete             Oswald Hillock   [facility] If 7PM-7AM, please contact night-coverage at [email or web address], Office  [phone]   01/08/2021, 7:00 PM  LOS: 1 day

## 2021-01-09 DIAGNOSIS — N3000 Acute cystitis without hematuria: Secondary | ICD-10-CM

## 2021-01-09 LAB — BASIC METABOLIC PANEL
Anion gap: 8 (ref 5–15)
BUN: 9 mg/dL (ref 8–23)
CO2: 22 mmol/L (ref 22–32)
Calcium: 8.3 mg/dL — ABNORMAL LOW (ref 8.9–10.3)
Chloride: 109 mmol/L (ref 98–111)
Creatinine, Ser: 0.58 mg/dL — ABNORMAL LOW (ref 0.61–1.24)
GFR, Estimated: >60 mL/min (ref >60)
Glucose, Bld: 89 mg/dL (ref 70–99)
Potassium: 3.6 mmol/L (ref 3.5–5.1)
Sodium: 139 mmol/L (ref 135–145)

## 2021-01-09 LAB — URINE CULTURE: Culture: >=100000 — AB

## 2021-01-09 LAB — GLUCOSE, CAPILLARY
Glucose-Capillary: 99 mg/dL (ref 70–99)
Glucose-Capillary: 94 mg/dL (ref 70–99)
Glucose-Capillary: 110 mg/dL — ABNORMAL HIGH (ref 70–99)
Glucose-Capillary: 99 mg/dL (ref 70–99)

## 2021-01-09 LAB — CBC
HCT: 28 % — ABNORMAL LOW (ref 39.0–52.0)
Hemoglobin: 9.3 g/dL — ABNORMAL LOW (ref 13.0–17.0)
MCH: 30.9 pg (ref 26.0–34.0)
MCHC: 33.2 g/dL (ref 30.0–36.0)
MCV: 93 fL (ref 80.0–100.0)
Platelets: 122 10*3/uL — ABNORMAL LOW (ref 150–400)
RBC: 3.01 MIL/uL — ABNORMAL LOW (ref 4.22–5.81)
RDW: 13.4 % (ref 11.5–15.5)
WBC: 2.9 10*3/uL — ABNORMAL LOW (ref 4.0–10.5)
nRBC: 0 % (ref 0.0–0.2)

## 2021-01-09 MED ORDER — CEFADROXIL 500 MG PO CAPS
1000.0000 mg | ORAL_CAPSULE | Freq: Two times a day (BID) | ORAL | Status: DC
  Administered 2021-01-09 – 2021-01-10 (×3): 1000 mg via ORAL
  Filled 2021-01-09 (×3): qty 2

## 2021-01-09 NOTE — TOC Progression Note (Signed)
Transition of Care (TOC) - Progression Note    Patient Details  Name: Benjamin Willis. MRN: [id number] Date of Birth: 09/14/1945  Transition of Care [facility]) CM/SW Contact  Natasha Bence, LCSW Phone Number: 01/09/2021, 1:18 PM  Clinical Narrative:    CSW notified Pam with [facility] of possible need for IV antibiotics for patient. Pam agreeable to follow. TOC to follow.        Expected Discharge Plan and Services                                                 Social Determinants of Health (SDOH) Interventions    Readmission Risk Interventions No flowsheet data found.

## 2021-01-09 NOTE — Progress Notes (Signed)
PROGRESS NOTE    Benjamin Willis.  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  No history of loss of consciousness.  Family found that patient was shivering in the bed.  Family was concerned that he was having a seizure so he was brought to the ED. In the ED was found to have abnormal UA, started on Rocephin. Assessment & Plan:   Active Problems:   SIRS (systemic inflammatory response syndrome) (HCC)   UTI (urinary tract infection)   E. coli UTI on Rocephin transition to oral antibiotics today.  We will monitor the patient overnight and if patient remains stable will possibly discharge in the morning.    Multiple falls probably secondary to deconditioning and debility Therapy evaluations recommending home health PT.    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    Type 2 diabetes mellitus Continue with sliding scale insulin   Hypertension Blood pressure parameters are stable at this time.   Seizure disorder Continue with Keppra   Anemia of chronic disease Hemoglobin is stable around 10   Hypokalemia Replaced.       DVT prophylaxis: (Lovenox) Code Status: (Full/code) Family Communication: (none at bedside.  Disposition:   Status is: Inpatient  Remains inpatient appropriate because:Unsafe d/c plan  Dispo: The patient is from: Home              Anticipated d/c is to: Home              Patient currently is not medically stable to d/c.   Difficult to place patient No       Consultants:  None.   Procedures: none.   Antimicrobials:   Antibiotics Given (last 72 hours)      Date/Time Action Medication Dose Rate   01/07/21 1445 New Bag/Given   cefTRIAXone (ROCEPHIN) 2 g in sodium chloride 0.9 % 100 mL IVPB 2 g 200 mL/hr   01/08/21 1352 New Bag/Given   cefTRIAXone (ROCEPHIN) 1 g in sodium chloride 0.9 % 100 mL IVPB 1 g 200 mL/hr   01/09/21 1442 Given   cefadroxil (DURICEF) capsule 1,000 mg 1,000 mg          Subjective: No new complaints.   Objective: Vitals:   01/08/21 1953 01/08/21 2040 01/09/21 0442 01/09/21 0954  BP: 126/79 124/69 137/80 128/85  Pulse:  73 81 79  Resp:  16 16 17   Temp:  98.4 F (36.9 C) 98.2 F (36.8 C)   TempSrc:  Oral Oral   SpO2:  97% 94%   Weight:      Height:        Intake/Output Summary (Last 24 hours) at 01/09/2021 1422 Last data filed at 01/09/2021 1341 Gross per 24 hour  Intake 738 ml  Output 2100 ml  Net -1362 ml   Filed Weights   01/07/21 0958  Weight: 72.6 kg    Examination:  General exam: Appears calm and comfortable  Respiratory system: Clear to auscultation. Respiratory effort normal. Cardiovascular system: S1 & S2 heard, RRR. No JVD,  No pedal edema. Gastrointestinal system: Abdomen is nondistended, soft and nontender.. Normal bowel sounds heard. Central  nervous system: Alert and oriented. No focal neurological deficits. Extremities: Symmetric 5 x 5 power. Skin: No rashes, lesions or ulcers Psychiatry: Mood & affect appropriate.     Data Reviewed: I have personally reviewed following labs and imaging studies  CBC: Recent Labs  Lab 01/04/21 1005 01/07/21 1115 01/08/21 0520 01/09/21 0529  WBC 3.1* 2.2* 3.0* 2.9*  NEUTROABS 2.0 1.8  --   --   HGB 11.1* 9.9* 9.9* 9.3*  HCT 32.8* 30.8* 30.2* 28.0*  MCV 92.9 95.1 94.4 93.0  PLT 304 166 132* 122*    Basic Metabolic Panel: Recent Labs  Lab 01/04/21 1005 01/07/21 1115 01/08/21 0520 01/09/21 0529  NA 146* 142 140 139  K 3.9 3.5 3.4* 3.6  CL 110 110 106 109  CO2 26 25 26 22   GLUCOSE 78 77 97 89  BUN 11 22 14 9   CREATININE 0.70  0.95 0.67 0.58*  CALCIUM 9.4 8.6* 8.6* 8.3*    GFR: Estimated Creatinine Clearance: 84.4 mL/min (A) (by C-G formula based on SCr of 0.58 mg/dL (L)).  Liver Function Tests: Recent Labs  Lab 01/04/21 1005 01/07/21 1115 01/08/21 0520  AST 21 41 38  ALT 24 28 25   ALKPHOS 74 72 52  BILITOT 0.5 1.0 0.8  PROT 6.4* 5.7* 5.4*  ALBUMIN 3.8 3.2* 2.7*    CBG: Recent Labs  Lab 01/08/21 1157 01/08/21 1641 01/08/21 2043 01/09/21 0936 01/09/21 1239  GLUCAP 108* 112* 106* 99 94     Recent Results (from the past 240 hour(s))  Blood Culture (routine x 2)     Status: None (Preliminary result)   Collection Time: 01/07/21 11:05 AM   Specimen: BLOOD  Result Value Ref Range Status   Specimen Description   Final    BLOOD RIGHT ANTECUBITAL Performed at [facility], [employer] [address], [place], [place] [address]    Special Requests   Final    BOTTLES DRAWN AEROBIC AND ANAEROBIC Blood Culture adequate volume Performed at [facility] [address], [place], [place] [address]    Culture   Final    NO GROWTH 2 DAYS Performed at [facility] [address], [place], [place] [address]    Report Status PENDING  Incomplete  Resp Panel by RT-PCR (Flu A&B, Covid) Nasopharyngeal Swab     Status: None   Collection Time: 01/07/21 11:15 AM   Specimen: Nasopharyngeal Swab; Nasopharyngeal(NP) swabs in vial transport medium  Result Value Ref Range Status   SARS Coronavirus 2 by RT PCR NEGATIVE NEGATIVE Final    Comment: (NOTE) SARS-CoV-2 target nucleic acids are NOT DETECTED.  The SARS-CoV-2 RNA is generally detectable in upper respiratory specimens during the acute phase of infection. The lowest concentration of SARS-CoV-2 viral copies this assay can detect is 138 copies/mL. A negative result does not preclude SARS-Cov-2 infection and should not be used as the sole basis for treatment or other patient management decisions. A negative result may occur  with  improper specimen collection/handling, submission of specimen other than nasopharyngeal swab, presence of viral mutation(s) within the areas targeted by this assay, and inadequate number of viral copies(<138 copies/mL). A negative result must be combined with clinical observations, patient history, and epidemiological information. The expected result is Negative.  Fact Sheet for Patients:  EntrepreneurPulse.com.au  Fact Sheet for Healthcare Providers:  IncredibleEmployment.be  This test is no t yet approved or cleared by the [place] FDA and  has been authorized for detection and/or diagnosis of  SARS-CoV-2 by FDA under an Emergency Use Authorization (EUA). This EUA will remain  in effect (meaning this test can be used) for the duration of the COVID-19 declaration under Section 564(b)(1) of the Act, 21 U.S.C.section 360bbb-3(b)(1), unless the authorization is terminated  or revoked sooner.       Influenza A by PCR NEGATIVE NEGATIVE Final   Influenza B by PCR NEGATIVE NEGATIVE Final    Comment: (NOTE) The Xpert Xpress SARS-CoV-2/FLU/RSV plus assay is intended as an aid in the diagnosis of influenza from Nasopharyngeal swab specimens and should not be used as a sole basis for treatment. Nasal washings and aspirates are unacceptable for Xpert Xpress SARS-CoV-2/FLU/RSV testing.  Fact Sheet for Patients: EntrepreneurPulse.com.au  Fact Sheet for Healthcare Providers: IncredibleEmployment.be  This test is not yet approved or cleared by the [place] FDA and has been authorized for detection and/or diagnosis of SARS-CoV-2 by FDA under an Emergency Use Authorization (EUA). This EUA will remain in effect (meaning this test can be used) for the duration of the COVID-19 declaration under Section 564(b)(1) of the Act, 21 U.S.C. section 360bbb-3(b)(1), unless the authorization is terminated  or revoked.  Performed at [facility], [place] [address] [place], [place] [address]   Blood Culture (routine x 2)     Status: None (Preliminary result)   Collection Time: 01/07/21 11:16 AM   Specimen: BLOOD  Result Value Ref Range Status   Specimen Description   Final    BLOOD LEFT ANTECUBITAL Performed at [facility] [address] [place], [place] [address]    Special Requests   Final    BOTTLES DRAWN AEROBIC AND ANAEROBIC Blood Culture adequate volume Performed at [facility] [address] [place], [place] [address]    Culture   Final    NO GROWTH 2 DAYS Performed at [facility] [address] [place], [place] [address]    Report Status PENDING  Incomplete  Urine Culture     Status: Abnormal   Collection Time: 01/07/21  1:22 PM   Specimen: In/Out Cath Urine  Result Value Ref Range Status   Specimen Description   Final    IN/OUT CATH URINE Performed at [facility] [address], [place], [place] [address]    Special Requests   Final    Immunocompromised Performed at [facility], [place] [address], [place], [place] [address]    Culture >=100,000 COLONIES/mL ESCHERICHIA COLI (A)  Final   Report Status 01/09/2021 FINAL  Final   Organism ID, Bacteria ESCHERICHIA COLI (A)  Final      Susceptibility   Escherichia coli - MIC*    AMPICILLIN 4 SENSITIVE Sensitive     CEFAZOLIN <=4 SENSITIVE Sensitive     CEFEPIME <=0.12 SENSITIVE Sensitive     CEFTRIAXONE <=0.25 SENSITIVE Sensitive     CIPROFLOXACIN <=0.25 SENSITIVE Sensitive     GENTAMICIN <=1 SENSITIVE Sensitive     IMIPENEM <=0.25 SENSITIVE Sensitive     NITROFURANTOIN <=16 SENSITIVE Sensitive     TRIMETH/SULFA <=20 SENSITIVE Sensitive     AMPICILLIN/SULBACTAM <=2 SENSITIVE Sensitive     PIP/TAZO <=4 SENSITIVE Sensitive     * >=100,000 COLONIES/mL ESCHERICHIA COLI         Radiology Studies: No results  found.      Scheduled Meds:  amLODipine  10 mg Oral Daily   cefadroxil  1,000 mg Oral BID   folic acid  1 mg Oral Daily  gabapentin  300 mg Oral QHS   insulin aspart  0-5 Units Subcutaneous QHS   insulin aspart  0-9 Units Subcutaneous TID WC   latanoprost  1 drop Both Eyes QHS   levETIRAcetam  1,000 mg Oral BID   mouth rinse  15 mL Mouth Rinse BID   multivitamin with minerals  1 tablet Oral Daily   Continuous Infusions:  sodium chloride 75 mL/hr at 01/09/21 0601     LOS: 2 days        Hosie Poisson, MD [facility]   To contact the attending provider between 7A-7P or the covering provider during after hours 7P-7A, please log into the web site [email or web address] and access using universal [place] password for that web site. If you do not have the password, please call the hospital operator.  01/09/2021, 2:22 PM

## 2021-01-10 DIAGNOSIS — R6510 Systemic inflammatory response syndrome (SIRS) of non-infectious origin without acute organ dysfunction: Secondary | ICD-10-CM

## 2021-01-10 DIAGNOSIS — N390 Urinary tract infection, site not specified: Principal | ICD-10-CM

## 2021-01-10 LAB — GLUCOSE, CAPILLARY
Glucose-Capillary: 88 mg/dL (ref 70–99)
Glucose-Capillary: 102 mg/dL — ABNORMAL HIGH (ref 70–99)

## 2021-01-10 MED ORDER — CEFADROXIL 500 MG PO CAPS: 1000.0000 mg | ORAL_CAPSULE | Freq: Two times a day (BID) | ORAL | 0 refills | Status: AC

## 2021-01-10 NOTE — Progress Notes (Signed)
Occupational Therapy Treatment Patient Details Name: Benjamin Willis. MRN: [id number] DOB: 08-Jun-1945 Today's Date: 01/10/2021    History of present illness Patient is a 75 year old male admitted with multiple falls at home, increased weakness and poor appetite. Pt diagnosed with UTI. PMH includes non-small cell lung cancer w/ brain mets, HTN, DM2   OT comments  Patient agreeable to OT this AM. Modified independent with bed mobility, increased time to push up from elbow. Patient overall min G assist for safety with functional ambulation with walker in room and to stand from edge of bed needing 2 attempts to power up. Patient supervision standing at sink for oral care and washing face/hands with no UE support needed. Needs cue for follow through to reach back when sitting into recliner chair. Continue to recommend acute OT to facilitate D/C home with family.    Follow Up Recommendations  Home health OT;Supervision/Assistance - 24 hour    Equipment Recommendations  None recommended by OT       Precautions / Restrictions Precautions Precautions: Fall Precaution Comments: per family has had at least 2 falls in past few days       Mobility Bed Mobility Overal bed mobility: Modified Independent                  Transfers Overall transfer level: Needs assistance Equipment used: Rolling walker (2 wheeled) Transfers: Sit to/from Stand Sit to Stand: Min guard         General transfer comment: cues for safety    Balance Overall balance assessment: History of Falls;Needs assistance Sitting-balance support: Feet supported Sitting balance-Leahy Scale: Good     Standing balance support: No upper extremity supported Standing balance-Leahy Scale: Fair Standing balance comment: static standing at sink without UEs                           ADL either performed or assessed with clinical judgement   ADL Overall ADL's : Needs assistance/impaired     Grooming: Oral  care;Wash/dry face;Wash/dry hands;Supervision/safety;Standing Grooming Details (indicate cue type and reason): no loss of balance and able to maintain static standing without UE support                 Toilet Transfer: Min guard;Ambulation;RW Toilet Transfer Details (indicate cue type and reason): does not need assist to power up to standing, min G for safety and does not follow through cue to reach back for chair when sitting         Functional mobility during ADLs: Min guard;Rolling walker;Cueing for safety        Cognition Arousal/Alertness: Awake/alert Behavior During Therapy: WFL for tasks assessed/performed Overall Cognitive Status: No family/caregiver present to determine baseline cognitive functioning                                 General Comments: impaired safety                   Pertinent Vitals/ Pain       Pain Assessment: No/denies pain         Frequency  Min 2X/week        Progress Toward Goals  OT Goals(current goals can now be found in the care plan section)  Progress towards OT goals: Progressing toward goals  Acute Rehab OT Goals Patient Stated Goal: likes to be outside OT Goal Formulation:  With patient Time For Goal Achievement: 01/22/21 Potential to Achieve Goals: Good ADL Goals Pt Will Perform Lower Body Dressing: with modified independence;sit to/from stand;sitting/lateral leans Pt Will Transfer to Toilet: with modified independence;ambulating (walker vs cane) Pt Will Perform Toileting - Clothing Manipulation and hygiene: with modified independence;sitting/lateral leans;sit to/from stand Additional ADL Goal #1: Patient will require less than 25% cues for safe body mechanics during functional transfers/mobility to minimize risk of falls.  Plan Discharge plan remains appropriate       AM-PAC OT "6 Clicks" Daily Activity     Outcome Measure   Help from another person eating meals?: None Help from another person  taking care of personal grooming?: A Little Help from another person toileting, which includes using toliet, bedpan, or urinal?: A Little Help from another person bathing (including washing, rinsing, drying)?: A Little Help from another person to put on and taking off regular upper body clothing?: A Little Help from another person to put on and taking off regular lower body clothing?: A Little 6 Click Score: 19    End of Session Equipment Utilized During Treatment: Rolling walker  OT Visit Diagnosis: History of falling (Z91.81)   Activity Tolerance Patient tolerated treatment well   Patient Left in chair;with call bell/phone within reach;with chair alarm set   Nurse Communication Mobility status        Time: [phone] OT Time Calculation (min): 20 min  Charges: OT General Charges $OT Visit: 1 Visit OT Treatments $Self Care/Home Management : 8-22 mins  Delbert Phenix OT OT pager: [place] 01/10/2021, 9:24 AM

## 2021-01-10 NOTE — TOC Initial Note (Signed)
Transition of Care (TOC) - Initial/Assessment Note    Patient Details  Name: Benjamin Willis. MRN: [id number] Date of Birth: 09/28/1945  Transition of Care [facility]) CM/SW Contact:    Catarino Vold, Marjie Skiff, RN Phone Number: 01/10/2021, 10:10 AM  Clinical Narrative:                 Spoke with pt for dc planning. Pt states that he lives with his sister and she is with him all the time. He states he has a RW at home. Choice offered for [facility] and [facility] chosen. [facility] liaison contacted for referral.  Expected Discharge Plan: [place] Barriers to Discharge: No Barriers Identified   Patient Goals and CMS Choice Patient states their goals for this hospitalization and ongoing recovery are:: To go home CMS Medicare.gov Compare Post Acute Care list provided to:: Patient Choice offered to / list presented to : Patient  Expected Discharge Plan and Services Expected Discharge Plan: [place]   Discharge Planning Services: CM Consult Post Acute Care Choice: [place] arrangements for the past 2 months: Single Family Home Expected Discharge Date: 01/10/21                         HH Arranged: PT, OT HH Agency: [place] Date [facility] Agency Contacted: 01/10/21 Time [place]: [phone] Representative spoke with at [place]: Tommi Rumps  Prior Living Arrangements/Services Living arrangements for the past 2 months: [place] Lives with:: Siblings Patient language and need for interpreter reviewed:: Yes Do you feel safe going back to the place where you live?: Yes      Need for Family Participation in Patient Care: Yes (Comment) Care giver support system in place?: Yes (comment)   Criminal Activity/Legal Involvement Pertinent to Current Situation/Hospitalization: No - Comment as needed  Activities of Daily Living Home Assistive Devices/Equipment: None ADL Screening (condition at time of admission) Patient's cognitive ability adequate  to safely complete daily activities?: Yes Is the patient deaf or have difficulty hearing?: No Does the patient have difficulty seeing, even when wearing glasses/contacts?: No Does the patient have difficulty concentrating, remembering, or making decisions?: No Patient able to express need for assistance with ADLs?: Yes Does the patient have difficulty dressing or bathing?: Yes Independently performs ADLs?: No Communication: Needs assistance Dressing (OT): Needs assistance Does the patient have difficulty walking or climbing stairs?: Yes Weakness of Legs: Both Weakness of Arms/Hands: None  Permission Sought/Granted Permission sought to share information with : Facility Art therapist granted to share information with : Yes, Verbal Permission Granted     Permission granted to share info w AGENCY: [facility]        Emotional Assessment   Attitude/Demeanor/Rapport: Gracious Affect (typically observed): Calm Orientation: : Oriented to Self, Oriented to Place, Oriented to  Time, Oriented to Situation Alcohol / Substance Use: Not Applicable Psych Involvement: No (comment)  Admission diagnosis:  SIRS (systemic inflammatory response syndrome) (HCC) [R65.10] UTI (urinary tract infection) [N39.0] Patient Active Problem List   Diagnosis Date Noted   SIRS (systemic inflammatory response syndrome) ([place]) 01/07/2021   UTI (urinary tract infection) 01/07/2021   Port-A-Cath in place 12/14/2020   Brain metastases [facility])    Palliative care by specialist    Metastatic lung cancer (metastasis from lung to other site) [facility]) 10/16/2020   Diabetes mellitus type 2 in nonobese ([place]) 10/16/2020   Glaucoma 10/16/2020   Lung cancer metastatic  to brain [facility]) 10/16/2020   Atherosclerosis of aorta ([place]) 05/31/2020   Primary cancer of right upper lobe of lung ([place]) 02/07/2019   Lung nodule 02/02/2019   Hypertension associated with type 2 diabetes mellitus ([place]) 05/24/2018   Dyslipidemia  05/24/2018   Dyslipidemia associated with type 2 diabetes mellitus ([place]) 06/09/2017   PCP:  Horald Pollen, MD Pharmacy:   [facility] [address] [place]), [place] [address] [place] ([place]) [place] [address] Phone: [phone] Fax: [phone]     Social Determinants of Health (SDOH) Interventions    Readmission Risk Interventions Readmission Risk Prevention Plan 01/10/2021  Transportation Screening Complete  PCP or Specialist Appt within 3-5 Days Complete  HRI or [place] Complete  Social Work Consult for [place] Planning/Counseling Complete  Palliative Care Screening Not Applicable  Medication Review [employer]) Complete  Some recent data might be hidden

## 2021-01-10 NOTE — Care Management Important Message (Signed)
Important Message  Patient Details IM Letter given to the Patient. Name: Benjamin Willis. MRN: [id number] Date of Birth: 31-Mar-1946   Medicare Important Message Given:  Yes     Kerin Salen 01/10/2021, 10:09 AM

## 2021-01-11 ENCOUNTER — Telehealth: Payer: Self-pay

## 2021-01-11 DIAGNOSIS — N390 Urinary tract infection, site not specified: Secondary | ICD-10-CM | POA: Diagnosis not present

## 2021-01-11 DIAGNOSIS — C3490 Malignant neoplasm of unspecified part of unspecified bronchus or lung: Secondary | ICD-10-CM | POA: Diagnosis not present

## 2021-01-11 DIAGNOSIS — I10 Essential (primary) hypertension: Secondary | ICD-10-CM | POA: Diagnosis not present

## 2021-01-11 DIAGNOSIS — K219 Gastro-esophageal reflux disease without esophagitis: Secondary | ICD-10-CM | POA: Diagnosis not present

## 2021-01-11 DIAGNOSIS — E119 Type 2 diabetes mellitus without complications: Secondary | ICD-10-CM | POA: Diagnosis not present

## 2021-01-11 DIAGNOSIS — Z9181 History of falling: Secondary | ICD-10-CM | POA: Diagnosis not present

## 2021-01-11 DIAGNOSIS — Z452 Encounter for adjustment and management of vascular access device: Secondary | ICD-10-CM | POA: Diagnosis not present

## 2021-01-11 DIAGNOSIS — C7931 Secondary malignant neoplasm of brain: Secondary | ICD-10-CM | POA: Diagnosis not present

## 2021-01-11 DIAGNOSIS — H409 Unspecified glaucoma: Secondary | ICD-10-CM | POA: Diagnosis not present

## 2021-01-11 DIAGNOSIS — G40909 Epilepsy, unspecified, not intractable, without status epilepticus: Secondary | ICD-10-CM | POA: Diagnosis not present

## 2021-01-11 DIAGNOSIS — Z7984 Long term (current) use of oral hypoglycemic drugs: Secondary | ICD-10-CM | POA: Diagnosis not present

## 2021-01-11 DIAGNOSIS — D630 Anemia in neoplastic disease: Secondary | ICD-10-CM | POA: Diagnosis not present

## 2021-01-11 DIAGNOSIS — Z87891 Personal history of nicotine dependence: Secondary | ICD-10-CM | POA: Diagnosis not present

## 2021-01-11 DIAGNOSIS — E785 Hyperlipidemia, unspecified: Secondary | ICD-10-CM | POA: Diagnosis not present

## 2021-01-11 NOTE — Discharge Summary (Signed)
Physician Discharge Summary  Benjamin Willis. OLM:[id number] DOB: Sep 15, 1945 DOA: 01/07/2021  PCP: Horald Pollen, MD  Admit date: 01/07/2021 Discharge date: 01/10/2021  Admitted From: Home Disposition:  Home  Recommendations for Outpatient Follow-up:  Follow up with PCP in 1-2 weeks Please obtain BMP/CBC in one week   Discharge Condition:stable CODE STATUS: full code.  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  No history of loss of consciousness.  Family found that patient was shivering in the bed.  Family was concerned that he was having a seizure so he was brought to the ED. In the ED was found to have abnormal UA, started on Rocephin.   Discharge Diagnoses:  Active Problems:   SIRS (systemic inflammatory response syndrome) (HCC)   UTI (urinary tract infection)   E coli UTI on Rocephin transition to oral antibiotics today.   Home health on discharge.        Multiple falls probably secondary to deconditioning and debility Therapy evaluations recommending home health PT.       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       Type 2 diabetes mellitus No changes in meds. Resume home meds.      Hypertension Blood pressure parameters are stable at this time.     Seizure disorder Continue with Keppra     Anemia of chronic disease Hemoglobin is stable around 10     Hypokalemia Replaced.      Discharge Instructions  Discharge Instructions     Diet - low sodium heart healthy   Complete by: As directed    Discharge instructions   Complete by: As directed    Follow up with PCP in one week.      Allergies as of 01/10/2021   No Known Allergies      Medication List      TAKE these medications    acetaminophen 500 MG tablet Commonly known as: TYLENOL Take 1,000 mg by mouth every 6 (six) hours as needed for mild pain.   albuterol 108 (90 Base) MCG/ACT inhaler Commonly known as: VENTOLIN HFA Inhale 2 puffs into the lungs every 6 (six) hours as needed for wheezing or shortness of breath.   amLODipine 10 MG tablet Commonly known as: NORVASC Take 1 tablet (10 mg total) by mouth daily.   blood glucose meter kit and supplies Kit Dispense based on patient and insurance preference. Use up to four times daily as directed.   Blood Pressure Monitor/Arm Devi Check BP once a day. DX I 10   cefadroxil 500 MG capsule Commonly known as: DURICEF Take 2 capsules (1,000 mg total) by mouth 2 (two) times daily for 5 days.   cetirizine 10 MG tablet Commonly known as: ZYRTEC Take 1 tablet (10 mg total) by mouth daily. What changed:  when to take this reasons to take this   folic acid 1 MG tablet Commonly known as: FOLVITE Take 1 tablet (1 mg total) by mouth daily.   gabapentin 300 MG capsule Commonly known as: NEURONTIN Take 1 capsule (300 mg total) by mouth at bedtime.   glipiZIDE 5 MG tablet Commonly known as: GLUCOTROL TAKE 1 TABLET BY MOUTH TWICE DAILY WITH A MEAL What changed: when to take this   latanoprost 0.005 % ophthalmic solution Commonly  known as: XALATAN Place 1 drop into both eyes at bedtime.   levETIRAcetam 1000 MG tablet Commonly known as: KEPPRA Take 1 tablet (1,000 mg total) by mouth 2 (two) times daily.   lidocaine-prilocaine cream Commonly known as: EMLA Apply 1 application topically as needed. What changed: reasons to take this   lisinopril 40 MG tablet Commonly known as: ZESTRIL Take 1 tablet (40 mg total) by mouth daily.   metFORMIN 500 MG tablet Commonly known as: GLUCOPHAGE TAKE 1 TABLET BY MOUTH TWICE DAILY WITH MEALS What changed: when to take this   Multivitamin Men Tabs Take 1 tablet by mouth daily.    ondansetron 8 MG tablet Commonly known as: ZOFRAN Take 1 tablet (8 mg total) by mouth every 8 (eight) hours as needed for nausea or vomiting.   prochlorperazine 10 MG tablet Commonly known as: COMPAZINE Take 1 tablet (10 mg total) by mouth every 6 (six) hours as needed for nausea or vomiting.   sitaGLIPtin 100 MG tablet Commonly known as: Januvia Take 1 tablet (100 mg total) by mouth daily.        Follow-up Information     [facility] Follow up.   Specialty: Home Health Services Contact information: [address] [place] [address] [phone]                No Known Allergies  Consultations: None.    Procedures/Studies: DG Chest Port 1 View  Result Date: 01/07/2021 CLINICAL DATA:  Weakness, concern for sepsis, lung cancer EXAM: PORTABLE CHEST 1 VIEW COMPARISON:  10/15/2020, 10/16/2020 FINDINGS: Right IJ power port catheter tip mid SVC level. Stable right upper lobe perihilar masslike opacity with radiopaque markers. No new superimposed airspace process, collapse or consolidation. No large effusion or pneumothorax. Trachea midline. Normal heart size and vascularity. IMPRESSION: No superimposed acute chest process. Stable right upper lobe perihilar masslike opacity Electronically Signed   By: Jerilynn Mages.  Shick M.D.   On: 01/07/2021 10:58   DG Hand Complete Left  Result Date: 12/17/2020 CLINICAL DATA:  Left hand swelling for few days, no reported injury EXAM: LEFT HAND - COMPLETE 3+ VIEW COMPARISON:  None. FINDINGS: No fracture or dislocation. No focal osseous lesions. 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 No radiopaque foreign bodies. Dorsal left hand soft tissue swelling. IMPRESSION: No acute osseous abnormality. Dorsal left hand soft tissue swelling. 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 Electronically Signed   By: Ilona Sorrel  M.D.   On: 12/17/2020 14:52   IR IMAGING GUIDED PORT INSERTION  Result Date: 12/13/2020 INDICATION: 75 year old male referred for port catheter EXAM: IMAGE GUIDED PORT CATHETER MEDICATIONS: None ANESTHESIA/SEDATION: Moderate (conscious) sedation was employed during this procedure. A total of Versed 2.0 mg and Fentanyl 100 mcg was administered intravenously. Moderate Sedation Time: 15 minutes. The patient's level of consciousness and vital signs were monitored continuously by radiology nursing throughout the procedure under my direct supervision. FLUOROSCOPY TIME:  Fluoroscopy Time: 0 minutes 6 seconds (1 mGy). COMPLICATIONS: None PROCEDURE: The procedure, risks, benefits, and alternatives were explained to the patient. Questions regarding the procedure were encouraged and answered. The patient understands and consents to the procedure. Ultrasound survey was performed with images stored and sent to PACs. The right neck and chest was prepped with chlorhexidine, and draped in the usual sterile fashion using maximum barrier technique (cap and mask, sterile gown, sterile gloves, large sterile sheet, hand hygiene and  cutaneous antiseptic). Local anesthesia was attained by infiltration with 1% lidocaine without epinephrine. Ultrasound demonstrated patency of the right internal jugular vein, and this was documented with an image. Under real-time ultrasound guidance, this vein was accessed with a 21 gauge micropuncture needle and image documentation was performed. A small dermatotomy was made at the access site with an 11 scalpel. A 0.018" wire was advanced into the SVC and used to estimate the length of the internal catheter. The access needle exchanged for a 18F micropuncture vascular sheath. The 0.018" wire was then removed and a 0.035" wire advanced into the IVC. An appropriate location for the subcutaneous reservoir was selected below the clavicle and an incision was made through the skin and underlying soft tissues.  The subcutaneous tissues were then dissected using a combination of blunt and sharp surgical technique and a pocket was formed. A single lumen power injectable portacatheter was then tunneled through the subcutaneous tissues from the pocket to the dermatotomy and the port reservoir placed within the subcutaneous pocket. The venous access site was then serially dilated and a peel away vascular sheath placed over the wire. The wire was removed and the port catheter advanced into position under fluoroscopic guidance. The catheter tip is positioned in the cavoatrial junction. This was documented with a spot image. The portacatheter was then tested and found to flush and aspirate well. The port was flushed with saline followed by 100 units/mL heparinized saline. The pocket was then closed in two layers using first subdermal inverted interrupted absorbable sutures followed by a running subcuticular suture. The epidermis was then sealed with Dermabond. The dermatotomy at the venous access site was also seal with Dermabond. Patient tolerated the procedure well and remained hemodynamically stable throughout. No complications encountered and no significant blood loss encountered IMPRESSION: Status post right IJ port placement. Signed, Dulcy Fanny. Dellia Nims, RPVI [facility] Radiology Electronically Signed   By: Corrie Mckusick D.O.   On: 12/13/2020 16:40   VAS [place] UPPER EXTREMITY VENOUS DUPLEX  Result Date: 12/18/2020 UPPER VENOUS STUDY  Patient Name:  Early Steel.  Date of Exam:   12/18/2020 Medical Rec #: [id number]          Accession #:    [id number] Date of Birth: 08-18-1945          Patient Gender: M Patient Age:   74 years Exam [facility]  [facility] Procedure:      VAS [place] UPPER EXTREMITY VENOUS DUPLEX Referring Phys: ABIGAIL HARRIS --------------------------------------------------------------------------------  Indications: Edema Comparison Study: No prior  study Performing Technologist: Maudry Mayhew MHA, RDMS, RVT, RDCS  Examination Guidelines: A complete evaluation includes B-mode imaging, spectral Doppler, color Doppler, and power Doppler as needed of all accessible portions of each vessel. Bilateral testing is considered an integral part of a complete examination. Limited examinations for reoccurring indications may be performed as noted.  Right Findings: +----------+------------+---------+-----------+----------+-------+ RIGHT     CompressiblePhasicitySpontaneousPropertiesSummary +----------+------------+---------+-----------+----------+-------+ Subclavian               Yes       Yes                      +----------+------------+---------+-----------+----------+-------+  Left Findings: +----------+------------+---------+-----------+----------+-------+ LEFT      CompressiblePhasicitySpontaneousPropertiesSummary +----------+------------+---------+-----------+----------+-------+ IJV           Full       Yes       Yes                      +----------+------------+---------+-----------+----------+-------+  Subclavian    Full       Yes       Yes                      +----------+------------+---------+-----------+----------+-------+ Axillary      Full       Yes       Yes                      +----------+------------+---------+-----------+----------+-------+ Brachial      Full       Yes       Yes                      +----------+------------+---------+-----------+----------+-------+ Radial        Full                                          +----------+------------+---------+-----------+----------+-------+ Ulnar         Full                                          +----------+------------+---------+-----------+----------+-------+ Cephalic      Full                                          +----------+------------+---------+-----------+----------+-------+ Basilic       Full                                           +----------+------------+---------+-----------+----------+-------+  Summary:  Right: No evidence of thrombosis in the subclavian.  Left: No evidence of deep vein thrombosis in the upper extremity. No evidence of superficial vein thrombosis in the upper extremity.  *See table(s) above for measurements and observations.  Diagnosing physician: Deitra Mayo MD Electronically signed by Deitra Mayo MD on 12/18/2020 at 3:25:47 PM.    Final       Subjective: No new complaints.  Discharge Exam: Vitals:   01/09/21 2037 01/10/21 0440  BP: (!) 147/83 (!) 142/64  Pulse: 84 81  Resp: 16 16  Temp: 99 F (37.2 C) 98.9 F (37.2 C)  SpO2: 96% 94%   Vitals:   01/09/21 0954 01/09/21 1451 01/09/21 2037 01/10/21 0440  BP: 128/85 128/84 (!) 147/83 (!) 142/64  Pulse: 79 85 84 81  Resp: _0 Temp:  99.1 F (37.3 C) 99 F (37.2 C) 98.9 F (37.2 C)  TempSrc:  Oral Oral Oral  SpO2:  96% 96% 94%  Weight:      Height:        General: Pt is alert, awake, not in acute distress Cardiovascular: RRR, S1/S2 +, no rubs, no gallops Respiratory: CTA bilaterally, no wheezing, no rhonchi Abdominal: Soft, NT, ND, bowel sounds + Extremities: no edema, no cyanosis    The results of significant diagnostics from this hospitalization (including imaging, microbiology, ancillary and laboratory) are listed below for reference.     Microbiology: Recent Results (from the past 240 hour(s))  Blood Culture (routine x 2)     Status: None (Preliminary result)  Collection Time: 01/07/21 11:05 AM   Specimen: BLOOD  Result Value Ref Range Status   Specimen Description   Final    BLOOD RIGHT ANTECUBITAL Performed at [facility] [address] [place], [place] [address]    Special Requests   Final    BOTTLES DRAWN AEROBIC AND ANAEROBIC Blood Culture adequate volume Performed at [facility] [address], [place], [place] [address]     Culture   Final    NO GROWTH 3 DAYS Performed at [facility], [place] [address] [place], [place] [address]    Report Status PENDING  Incomplete  Resp Panel by RT-PCR (Flu A&B, Covid) Nasopharyngeal Swab     Status: None   Collection Time: 01/07/21 11:15 AM   Specimen: Nasopharyngeal Swab; Nasopharyngeal(NP) swabs in vial transport medium  Result Value Ref Range Status   SARS Coronavirus 2 by RT PCR NEGATIVE NEGATIVE Final    Comment: (NOTE) SARS-CoV-2 target nucleic acids are NOT DETECTED.  The SARS-CoV-2 RNA is generally detectable in upper respiratory specimens during the acute phase of infection. The lowest concentration of SARS-CoV-2 viral copies this assay can detect is 138 copies/mL. A negative result does not preclude SARS-Cov-2 infection and should not be used as the sole basis for treatment or other patient management decisions. A negative result may occur with  improper specimen collection/handling, submission of specimen other than nasopharyngeal swab, presence of viral mutation(s) within the areas targeted by this assay, and inadequate number of viral copies(<138 copies/mL). A negative result must be combined with clinical observations, patient history, and epidemiological information. The expected result is Negative.  Fact Sheet for Patients:  EntrepreneurPulse.com.au  Fact Sheet for Healthcare Providers:  IncredibleEmployment.be  This test is no t yet approved or cleared by the [place] FDA and  has been authorized for detection and/or diagnosis of SARS-CoV-2 by FDA under an Emergency Use Authorization (EUA). This EUA will remain  in effect (meaning this test can be used) for the duration of the COVID-19 declaration under Section 564(b)(1) of the Act, 21 U.S.C.section 360bbb-3(b)(1), unless the authorization is terminated  or revoked sooner.       Influenza A by PCR NEGATIVE NEGATIVE Final   Influenza B by PCR  NEGATIVE NEGATIVE Final    Comment: (NOTE) The Xpert Xpress SARS-CoV-2/FLU/RSV plus assay is intended as an aid in the diagnosis of influenza from Nasopharyngeal swab specimens and should not be used as a sole basis for treatment. Nasal washings and aspirates are unacceptable for Xpert Xpress SARS-CoV-2/FLU/RSV testing.  Fact Sheet for Patients: EntrepreneurPulse.com.au  Fact Sheet for Healthcare Providers: IncredibleEmployment.be  This test is not yet approved or cleared by the [place] FDA and has been authorized for detection and/or diagnosis of SARS-CoV-2 by FDA under an Emergency Use Authorization (EUA). This EUA will remain in effect (meaning this test can be used) for the duration of the COVID-19 declaration under Section 564(b)(1) of the Act, 21 U.S.C. section 360bbb-3(b)(1), unless the authorization is terminated or revoked.  Performed at [facility], [place] [address] [place], [place] [address]   Blood Culture (routine x 2)     Status: None (Preliminary result)   Collection Time: 01/07/21 11:16 AM   Specimen: BLOOD  Result Value Ref Range Status   Specimen Description   Final    BLOOD LEFT ANTECUBITAL Performed at [facility] [address], [place], [place] [address]    Special Requests   Final  BOTTLES DRAWN AEROBIC AND ANAEROBIC Blood Culture adequate volume Performed at [facility] [address] [place], [place] [address]    Culture   Final    NO GROWTH 3 DAYS Performed at [facility], [place] [address], [place], [place] [address]    Report Status PENDING  Incomplete  Urine Culture     Status: Abnormal   Collection Time: 01/07/21  1:22 PM   Specimen: In/Out Cath Urine  Result Value Ref Range Status   Specimen Description   Final    IN/OUT CATH URINE Performed at [facility] [address] [place], [place] [address]    Special  Requests   Final    Immunocompromised Performed at [facility], [place] [address], [place], [place] [address]    Culture >=100,000 COLONIES/mL ESCHERICHIA COLI (A)  Final   Report Status 01/09/2021 FINAL  Final   Organism ID, Bacteria ESCHERICHIA COLI (A)  Final      Susceptibility   Escherichia coli - MIC*    AMPICILLIN 4 SENSITIVE Sensitive     CEFAZOLIN <=4 SENSITIVE Sensitive     CEFEPIME <=0.12 SENSITIVE Sensitive     CEFTRIAXONE <=0.25 SENSITIVE Sensitive     CIPROFLOXACIN <=0.25 SENSITIVE Sensitive     GENTAMICIN <=1 SENSITIVE Sensitive     IMIPENEM <=0.25 SENSITIVE Sensitive     NITROFURANTOIN <=16 SENSITIVE Sensitive     TRIMETH/SULFA <=20 SENSITIVE Sensitive     AMPICILLIN/SULBACTAM <=2 SENSITIVE Sensitive     PIP/TAZO <=4 SENSITIVE Sensitive     * >=100,000 COLONIES/mL ESCHERICHIA COLI     Labs: BNP (last 3 results) No results for input(s): BNP in the last 8760 hours. Basic Metabolic Panel: Recent Labs  Lab 01/04/21 1005 01/07/21 1115 01/08/21 0520 01/09/21 0529  NA 146* 142 140 139  K 3.9 3.5 3.4* 3.6  CL 110 110 106 109  CO2 _0 GLUCOSE 78 77 97 89  BUN _1 CREATININE 0.70 0.95 0.67 0.58*  CALCIUM 9.4 8.6* 8.6* 8.3*   Liver Function Tests: Recent Labs  Lab 01/04/21 1005 01/07/21 1115 01/08/21 0520  AST 21 41 38  ALT _2 ALKPHOS 74 72 52  BILITOT 0.5 1.0 0.8  PROT 6.4* 5.7* 5.4*  ALBUMIN 3.8 3.2* 2.7*   No results for input(s): LIPASE, AMYLASE in the last 168 hours. No results for input(s): AMMONIA in the last 168 hours. CBC: Recent Labs  Lab 01/04/21 1005 01/07/21 1115 01/08/21 0520 01/09/21 0529  WBC 3.1* 2.2* 3.0* 2.9*  NEUTROABS 2.0 1.8  --   --   HGB 11.1* 9.9* 9.9* 9.3*  HCT 32.8* 30.8* 30.2* 28.0*  MCV 92.9 95.1 94.4 93.0  PLT 304 166 132* 122*   Cardiac Enzymes: No results for input(s): CKTOTAL, CKMB, CKMBINDEX, TROPONINI in the last 168 hours. BNP: Invalid input(s):  POCBNP CBG: Recent Labs  Lab 01/09/21 1239 01/09/21 1657 01/09/21 2034 01/10/21 0806 01/10/21 1145  GLUCAP 94 110* 99 88 102*   D-Dimer No results for input(s): DDIMER in the last 72 hours. Hgb A1c No results for input(s): HGBA1C in the last 72 hours. Lipid Profile No results for input(s): CHOL, HDL, LDLCALC, TRIG, CHOLHDL, LDLDIRECT in the last 72 hours. Thyroid function studies No results for input(s): TSH, T4TOTAL, T3FREE, THYROIDAB in the last 72 hours.  Invalid input(s): FREET3 Anemia work up No results for input(s): VITAMINB12, FOLATE, FERRITIN, TIBC, IRON, RETICCTPCT in the last  72 hours. Urinalysis    Component Value Date/Time   COLORURINE YELLOW (A) 01/07/2021 1322   APPEARANCEUR HAZY (A) 01/07/2021 1322   LABSPEC 1.020 01/07/2021 1322   PHURINE 6.0 01/07/2021 1322   GLUCOSEU NEGATIVE 01/07/2021 1322   HGBUR MODERATE (A) 01/07/2021 1322   BILIRUBINUR NEGATIVE 01/07/2021 1322   KETONESUR 15 (A) 01/07/2021 1322   PROTEINUR 30 (A) 01/07/2021 1322   NITRITE POSITIVE (A) 01/07/2021 1322   LEUKOCYTESUR TRACE (A) 01/07/2021 1322   Sepsis Labs Invalid input(s): PROCALCITONIN,  WBC,  LACTICIDVEN Microbiology Recent Results (from the past 240 hour(s))  Blood Culture (routine x 2)     Status: None (Preliminary result)   Collection Time: 01/07/21 11:05 AM   Specimen: BLOOD  Result Value Ref Range Status   Specimen Description   Final    BLOOD RIGHT ANTECUBITAL Performed at [facility], [place] [address], [place], [place] [address]    Special Requests   Final    BOTTLES DRAWN AEROBIC AND ANAEROBIC Blood Culture adequate volume Performed at [facility] [address], [place], [place] [address]    Culture   Final    NO GROWTH 3 DAYS Performed at [facility], [place] [address], [place], [place] [address]    Report Status PENDING  Incomplete  Resp Panel by RT-PCR (Flu A&B, Covid) Nasopharyngeal Swab     Status: None    Collection Time: 01/07/21 11:15 AM   Specimen: Nasopharyngeal Swab; Nasopharyngeal(NP) swabs in vial transport medium  Result Value Ref Range Status   SARS Coronavirus 2 by RT PCR NEGATIVE NEGATIVE Final    Comment: (NOTE) SARS-CoV-2 target nucleic acids are NOT DETECTED.  The SARS-CoV-2 RNA is generally detectable in upper respiratory specimens during the acute phase of infection. The lowest concentration of SARS-CoV-2 viral copies this assay can detect is 138 copies/mL. A negative result does not preclude SARS-Cov-2 infection and should not be used as the sole basis for treatment or other patient management decisions. A negative result may occur with  improper specimen collection/handling, submission of specimen other than nasopharyngeal swab, presence of viral mutation(s) within the areas targeted by this assay, and inadequate number of viral copies(<138 copies/mL). A negative result must be combined with clinical observations, patient history, and epidemiological information. The expected result is Negative.  Fact Sheet for Patients:  EntrepreneurPulse.com.au  Fact Sheet for Healthcare Providers:  IncredibleEmployment.be  This test is no t yet approved or cleared by the [place] FDA and  has been authorized for detection and/or diagnosis of SARS-CoV-2 by FDA under an Emergency Use Authorization (EUA). This EUA will remain  in effect (meaning this test can be used) for the duration of the COVID-19 declaration under Section 564(b)(1) of the Act, 21 U.S.C.section 360bbb-3(b)(1), unless the authorization is terminated  or revoked sooner.       Influenza A by PCR NEGATIVE NEGATIVE Final   Influenza B by PCR NEGATIVE NEGATIVE Final    Comment: (NOTE) The Xpert Xpress SARS-CoV-2/FLU/RSV plus assay is intended as an aid in the diagnosis of influenza from Nasopharyngeal swab specimens and should not be used as a sole basis for treatment.  Nasal washings and aspirates are unacceptable for Xpert Xpress SARS-CoV-2/FLU/RSV testing.  Fact Sheet for Patients: EntrepreneurPulse.com.au  Fact Sheet for Healthcare Providers: IncredibleEmployment.be  This test is not yet approved or cleared by the [place] FDA and has been authorized for detection and/or diagnosis of SARS-CoV-2 by FDA under an Emergency Use Authorization (  EUA). This EUA will remain in effect (meaning this test can be used) for the duration of the COVID-19 declaration under Section 564(b)(1) of the Act, 21 U.S.C. section 360bbb-3(b)(1), unless the authorization is terminated or revoked.  Performed at [facility], [place] [address], [place], [place] [address]   Blood Culture (routine x 2)     Status: None (Preliminary result)   Collection Time: 01/07/21 11:16 AM   Specimen: BLOOD  Result Value Ref Range Status   Specimen Description   Final    BLOOD LEFT ANTECUBITAL Performed at [facility] [address], [place], [place] [address]    Special Requests   Final    BOTTLES DRAWN AEROBIC AND ANAEROBIC Blood Culture adequate volume Performed at [facility] [address] [place], [place] [address]    Culture   Final    NO GROWTH 3 DAYS Performed at [facility], [place] [address] [place] [place], [place] [address]    Report Status PENDING  Incomplete  Urine Culture     Status: Abnormal   Collection Time: 01/07/21  1:22 PM   Specimen: In/Out Cath Urine  Result Value Ref Range Status   Specimen Description   Final    IN/OUT CATH URINE Performed at [facility] [address], [place], [place] [address]    Special Requests   Final    Immunocompromised Performed at [facility], [place] [address] [place], [place] [address]    Culture >=100,000 COLONIES/mL ESCHERICHIA COLI (A)  Final   Report Status 01/09/2021 FINAL  Final    Organism ID, Bacteria ESCHERICHIA COLI (A)  Final      Susceptibility   Escherichia coli - MIC*    AMPICILLIN 4 SENSITIVE Sensitive     CEFAZOLIN <=4 SENSITIVE Sensitive     CEFEPIME <=0.12 SENSITIVE Sensitive     CEFTRIAXONE <=0.25 SENSITIVE Sensitive     CIPROFLOXACIN <=0.25 SENSITIVE Sensitive     GENTAMICIN <=1 SENSITIVE Sensitive     IMIPENEM <=0.25 SENSITIVE Sensitive     NITROFURANTOIN <=16 SENSITIVE Sensitive     TRIMETH/SULFA <=20 SENSITIVE Sensitive     AMPICILLIN/SULBACTAM <=2 SENSITIVE Sensitive     PIP/TAZO <=4 SENSITIVE Sensitive     * >=100,000 COLONIES/mL ESCHERICHIA COLI     Time coordinating discharge: 35 minutes  SIGNED:   Hosie Poisson, MD  [facility] 01/11/2021, 9:26 AM

## 2021-01-11 NOTE — Telephone Encounter (Cosign Needed)
Transition Care Management Follow-up Telephone Call Date of discharge and from where: 01/10/21 from [facility] long hospital How have you been since you were released from the hospital? Doing good Any questions or concerns? No  Items Reviewed: Did the pt receive and understand the discharge instructions provided? Yes  Medications obtained and verified? Yes  Other? No  Any new allergies since your discharge? No  Dietary orders reviewed? Yes Do you have support at home? Yes   Home Care and Equipment/Supplies: Were home health services ordered? yes If so, what is the name of the agency? [facility]  Has the agency set up a time to come to the patient's home? yes Were any new equipment or medical supplies ordered?  No What is the name of the medical supply agency?  Were you able to get the supplies/equipment? yes Do you have any questions related to the use of the equipment or supplies? No  Functional Questionnaire: (I = Independent and D = Dependent) ADLs: I  Bathing/Dressing- I  Meal Prep- I  Eating- I  Maintaining continence- I  Transferring/Ambulation- I  Managing Meds- I  Follow up appointments reviewed:  PCP Hospital f/u appt confirmed? No  Family calling 01/11/21 to make an appt  [facility] f/u appt confirmed? Yes  Scheduled to see [name] on 01/25/21 @ 10 & 10:40 . Are transportation arrangements needed? No  If their condition worsens, is the pt aware to call PCP or go to the Emergency Dept.? Yes Was the patient provided with contact information for the PCP's office or ED? Yes Was to pt encouraged to call back with questions or concerns? Yes

## 2021-01-12 LAB — CULTURE, BLOOD (ROUTINE X 2)
Culture: NO GROWTH
Culture: NO GROWTH
Special Requests: ADEQUATE
Special Requests: ADEQUATE

## 2021-01-14 DIAGNOSIS — D630 Anemia in neoplastic disease: Secondary | ICD-10-CM | POA: Diagnosis not present

## 2021-01-14 DIAGNOSIS — C3490 Malignant neoplasm of unspecified part of unspecified bronchus or lung: Secondary | ICD-10-CM | POA: Diagnosis not present

## 2021-01-14 DIAGNOSIS — I10 Essential (primary) hypertension: Secondary | ICD-10-CM | POA: Diagnosis not present

## 2021-01-14 DIAGNOSIS — E119 Type 2 diabetes mellitus without complications: Secondary | ICD-10-CM | POA: Diagnosis not present

## 2021-01-14 DIAGNOSIS — C7931 Secondary malignant neoplasm of brain: Secondary | ICD-10-CM | POA: Diagnosis not present

## 2021-01-14 DIAGNOSIS — N390 Urinary tract infection, site not specified: Secondary | ICD-10-CM | POA: Diagnosis not present

## 2021-01-15 DIAGNOSIS — D630 Anemia in neoplastic disease: Secondary | ICD-10-CM | POA: Diagnosis not present

## 2021-01-15 DIAGNOSIS — C3490 Malignant neoplasm of unspecified part of unspecified bronchus or lung: Secondary | ICD-10-CM | POA: Diagnosis not present

## 2021-01-15 DIAGNOSIS — E119 Type 2 diabetes mellitus without complications: Secondary | ICD-10-CM | POA: Diagnosis not present

## 2021-01-15 DIAGNOSIS — N390 Urinary tract infection, site not specified: Secondary | ICD-10-CM | POA: Diagnosis not present

## 2021-01-15 DIAGNOSIS — C7931 Secondary malignant neoplasm of brain: Secondary | ICD-10-CM | POA: Diagnosis not present

## 2021-01-15 DIAGNOSIS — I10 Essential (primary) hypertension: Secondary | ICD-10-CM | POA: Diagnosis not present

## 2021-01-16 ENCOUNTER — Encounter: Payer: Self-pay | Admitting: Emergency Medicine

## 2021-01-16 ENCOUNTER — Other Ambulatory Visit: Payer: Self-pay

## 2021-01-16 ENCOUNTER — Ambulatory Visit (INDEPENDENT_AMBULATORY_CARE_PROVIDER_SITE_OTHER): Payer: Medicare Other | Admitting: Emergency Medicine

## 2021-01-16 VITALS — BP 120/82 | HR 88 | Temp 98.5°F | Ht 72.0 in | Wt 150.0 lb

## 2021-01-16 DIAGNOSIS — C7931 Secondary malignant neoplasm of brain: Secondary | ICD-10-CM

## 2021-01-16 DIAGNOSIS — B9620 Unspecified Escherichia coli [E. coli] as the cause of diseases classified elsewhere: Secondary | ICD-10-CM | POA: Diagnosis not present

## 2021-01-16 DIAGNOSIS — E1159 Type 2 diabetes mellitus with other circulatory complications: Secondary | ICD-10-CM

## 2021-01-16 DIAGNOSIS — Z23 Encounter for immunization: Secondary | ICD-10-CM | POA: Diagnosis not present

## 2021-01-16 DIAGNOSIS — C3490 Malignant neoplasm of unspecified part of unspecified bronchus or lung: Secondary | ICD-10-CM

## 2021-01-16 DIAGNOSIS — I700 Atherosclerosis of aorta: Secondary | ICD-10-CM

## 2021-01-16 DIAGNOSIS — E785 Hyperlipidemia, unspecified: Secondary | ICD-10-CM

## 2021-01-16 DIAGNOSIS — E1169 Type 2 diabetes mellitus with other specified complication: Secondary | ICD-10-CM

## 2021-01-16 DIAGNOSIS — N390 Urinary tract infection, site not specified: Secondary | ICD-10-CM | POA: Diagnosis not present

## 2021-01-16 DIAGNOSIS — Z09 Encounter for follow-up examination after completed treatment for conditions other than malignant neoplasm: Secondary | ICD-10-CM | POA: Diagnosis not present

## 2021-01-16 DIAGNOSIS — I152 Hypertension secondary to endocrine disorders: Secondary | ICD-10-CM | POA: Diagnosis not present

## 2021-01-16 LAB — CBC WITH DIFFERENTIAL/PLATELET
Basophils Absolute: 0 10*3/uL (ref 0.0–0.1)
Basophils Relative: 0.3 % (ref 0.0–3.0)
Eosinophils Absolute: 0 10*3/uL (ref 0.0–0.7)
Eosinophils Relative: 0.5 % (ref 0.0–5.0)
HCT: 31.3 % — ABNORMAL LOW (ref 39.0–52.0)
Hemoglobin: 10.3 g/dL — ABNORMAL LOW (ref 13.0–17.0)
Lymphocytes Relative: 21.4 % (ref 12.0–46.0)
Lymphs Abs: 0.9 10*3/uL (ref 0.7–4.0)
MCHC: 32.9 g/dL (ref 30.0–36.0)
MCV: 94 fl (ref 78.0–100.0)
Monocytes Absolute: 0.8 10*3/uL (ref 0.1–1.0)
Monocytes Relative: 19.2 % — ABNORMAL HIGH (ref 3.0–12.0)
Neutro Abs: 2.4 10*3/uL (ref 1.4–7.7)
Neutrophils Relative %: 58.6 % (ref 43.0–77.0)
Platelets: 271 10*3/uL (ref 150.0–400.0)
RBC: 3.33 Mil/uL — ABNORMAL LOW (ref 4.22–5.81)
RDW: 13.8 % (ref 11.5–15.5)
WBC: 4 10*3/uL (ref 4.0–10.5)

## 2021-01-16 LAB — COMPREHENSIVE METABOLIC PANEL
ALT: 26 U/L (ref 0–53)
AST: 21 U/L (ref 0–37)
Albumin: 3.9 g/dL (ref 3.5–5.2)
Alkaline Phosphatase: 62 U/L (ref 39–117)
BUN: 7 mg/dL (ref 6–23)
CO2: 30 mEq/L (ref 19–32)
Calcium: 9.5 mg/dL (ref 8.4–10.5)
Chloride: 103 mEq/L (ref 96–112)
Creatinine, Ser: 0.69 mg/dL (ref 0.40–1.50)
GFR: 91.73 mL/min (ref >60.00)
Glucose, Bld: 98 mg/dL (ref 70–99)
Potassium: 3.4 mEq/L — ABNORMAL LOW (ref 3.5–5.1)
Sodium: 142 mEq/L (ref 135–145)
Total Bilirubin: 0.4 mg/dL (ref 0.2–1.2)
Total Protein: 6.8 g/dL (ref 6.0–8.3)

## 2021-01-16 NOTE — Progress Notes (Signed)
Benjamin Willis. 75 y.o.   Chief Complaint  Patient presents with   Hospitalization Follow-up    fall    HISTORY OF PRESENT ILLNESS: This is a 75 y.o. male here for hospital discharge follow-up. Admitted on 01/07/2021 and discharged on 01/10/2021.  Admitting diagnosis was UTI with sepsis. Just finished a course of antibiotics.  Doing much better. Discharge summary as follows: Physician Discharge Summary  Benjamin Willis. UYQ:[id number] DOB: 01-Apr-1946 DOA: 01/07/2021   PCP: Horald Pollen, MD   Admit date: 01/07/2021 Discharge date: 01/10/2021   Admitted From: Home Disposition:  Home   Recommendations for Outpatient Follow-up:  Follow up with PCP in 1-2 weeks Please obtain BMP/CBC in one week     Discharge Condition:stable CODE STATUS: full code.  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  No history of loss of consciousness.  Family found that patient was shivering in the bed.  Family was concerned that he was having a seizure so he was brought to the ED. In the ED was found to have abnormal UA, started on Rocephin.     Discharge Diagnoses:  Active Problems:   SIRS (systemic inflammatory response syndrome) (HCC)   UTI (urinary tract infection)    E coli UTI on Rocephin transition to oral antibiotics today.   Home health on discharge.        Multiple falls probably secondary to deconditioning and debility Therapy evaluations recommending home health PT.       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       Type 2 diabetes mellitus No changes in meds. Resume home meds.      Hypertension Blood pressure parameters are stable at this time.     Seizure disorder Continue with  Keppra     Anemia of chronic disease Hemoglobin is stable around 10     Hypokalemia Replaced.    HPI   Prior to Admission medications   Medication Sig Start Date End Date Taking? Authorizing Provider  acetaminophen (TYLENOL) 500 MG tablet Take 1,000 mg by mouth every 6 (six) hours as needed for mild pain.   Yes [provider]  albuterol (VENTOLIN HFA) 108 (90 Base) MCG/ACT inhaler Inhale 2 puffs into the lungs every 6 (six) hours as needed for wheezing or shortness of breath. 01/19/19  Yes Icard, Bradley L, DO  amLODipine (NORVASC) 10 MG tablet Take 1 tablet (10 mg total) by mouth daily. 12/06/19  Yes Bryna Razavi, Ines Bloomer, MD  blood glucose meter kit and supplies KIT Dispense based on patient and insurance preference. Use up to four times daily as directed. 10/23/20  Yes Elodia Florence., MD  Blood Pressure Monitoring (BLOOD PRESSURE MONITOR/ARM) DEVI Check BP once a day. DX I 10 04/18/19  Yes Favor Hackler, Ines Bloomer, MD  cetirizine (ZYRTEC) 10 MG tablet Take 1 tablet (10 mg total) by mouth daily. Patient taking differently: Take 10 mg by mouth daily as needed for allergies. 09/03/17  Yes Jaynee Eagles, PA-C  folic acid (FOLVITE) 1 MG tablet Take 1 tablet (1 mg total) by mouth daily. 11/30/20  Yes Orson Slick, MD  gabapentin (NEURONTIN) 300 MG capsule Take 1 capsule (300 mg total) by mouth at bedtime. 12/19/20 03/19/21 Yes Abisola Carrero, Ines Bloomer, MD  glipiZIDE (GLUCOTROL) 5 MG tablet TAKE 1 TABLET BY MOUTH TWICE DAILY WITH A MEAL Patient taking differently: Take 5 mg by mouth 2 (two) times daily before a meal. 10/08/20  Yes Cortni Tays, Ines Bloomer, MD  latanoprost (XALATAN) 0.005 % ophthalmic solution Place 1 drop into both eyes at bedtime. 01/24/20  Yes [provider]  levETIRAcetam (KEPPRA) 1000 MG tablet Take 1 tablet (1,000 mg total) by mouth 2 (two) times daily. 12/19/20 03/19/21 Yes Arlinda Barcelona, Ines Bloomer, MD  lidocaine-prilocaine (EMLA) cream Apply 1 application  topically as needed. Patient taking differently: Apply 1 application topically as needed (for port before tx). 11/30/20  Yes Orson Slick, MD  metFORMIN (GLUCOPHAGE) 500 MG tablet TAKE 1 TABLET BY MOUTH TWICE DAILY WITH MEALS Patient taking differently: Take 500 mg by mouth daily with breakfast. 11/22/20  Yes Havish Petties, Ines Bloomer, MD  Multiple Vitamins-Minerals (MULTIVITAMIN MEN) TABS Take 1 tablet by mouth daily.   Yes [provider]  ondansetron (ZOFRAN) 8 MG tablet Take 1 tablet (8 mg total) by mouth every 8 (eight) hours as needed for nausea or vomiting. 11/30/20  Yes Orson Slick, MD  prochlorperazine (COMPAZINE) 10 MG tablet Take 1 tablet (10 mg total) by mouth every 6 (six) hours as needed for nausea or vomiting. 11/30/20  Yes Orson Slick, MD  lisinopril (ZESTRIL) 40 MG tablet Take 1 tablet (40 mg total) by mouth daily. 12/06/19 01/07/21  Horald Pollen, MD  sitaGLIPtin (JANUVIA) 100 MG tablet Take 1 tablet (100 mg total) by mouth daily. 05/31/20 01/07/21  Horald Pollen, MD    No Known Allergies  Patient Active Problem List   Diagnosis Date Noted   SIRS (systemic inflammatory response syndrome) ([place]) 01/07/2021   UTI (urinary tract infection) 01/07/2021   Port-A-Cath in place 12/14/2020   Brain metastases [facility])    Palliative care by specialist    Metastatic lung cancer (metastasis from lung to other site) ([place]) 10/16/2020   Diabetes mellitus type 2 in nonobese ([place]) 10/16/2020   Glaucoma 10/16/2020   Lung cancer metastatic to brain ([place]) 10/16/2020   Atherosclerosis of aorta ([place]) 05/31/2020   Primary cancer of right upper lobe of lung ([place]) 02/07/2019   Lung nodule 02/02/2019   Hypertension associated with type 2 diabetes mellitus ([place]) 05/24/2018   Dyslipidemia 05/24/2018   Dyslipidemia associated with type 2 diabetes mellitus ([place]) 06/09/2017    Past Medical History:  Diagnosis Date   Allergy    Diabetes mellitus without complication ([place])     Type II   Glaucoma 10/16/2020   History of radiation therapy    [place]- SBRT 02/21/19-02/28/19, Rt Lung- IMRT 02/29/20-04/15/20 Dr. Gery Pray   History of radiation therapy 10/30/2020   whole brain 10/17/2020-10/30/2020 Dr Gery Pray   Hyperlipidemia    Hypertension    Lung cancer ([place])    Stage IA2 (cT1b, N0) non-small cell lung cancer (adenocarcinoma)    Past Surgical History:  Procedure Laterality Date   ELECTROMAGNETIC NAVIGATION BROCHOSCOPY Right 02/02/2019   Procedure: VIDEO BRONCHOSCOPY WITH NAVIGATION AND FIDUCIAL PLACEMENT;  Surgeon: Garner Nash, DO;  Location: [place];  Service: Cardiopulmonary;  Laterality: Right;   FUDUCIAL PLACEMENT Right 02/02/2019   Procedure: Placement Of Fuducial Right Upper Lobe;  Surgeon: Garner Nash, DO;  Location: [place];  Service: Cardiopulmonary;  Laterality: Right;   IR IMAGING GUIDED PORT INSERTION  12/13/2020   none     VIDEO BRONCHOSCOPY WITH RADIAL ENDOBRONCHIAL ULTRASOUND Right 02/02/2019  Procedure: Video Bronchoscopy With Radial Endobronchial Ultrasound;  Surgeon: Garner Nash, DO;  Location: [place];  Service: Cardiopulmonary;  Laterality: Right;    Social History   Socioeconomic History   Marital status: Single    Spouse name: Not on file   Number of children: 0   Years of education: Not on file   Highest education level: 9th grade  Occupational History   Not on file  Tobacco Use   Smoking status: Former    Packs/day: 1.00    Years: 48.00    Pack years: 48.00    Types: Cigarettes    Quit date: 02/20/2015    Years since quitting: 5.9   Smokeless tobacco: Former    Quit date: 10/2013  Vaping Use   Vaping Use: Never used  Substance and Sexual Activity   Alcohol use: No   Drug use: No   Sexual activity: Yes    Birth control/protection: Condom  Other Topics Concern   Not on file  Social History Narrative   Not on file   Social Determinants of Health   Financial Resource Strain: Not on file  Food  Insecurity: Not on file  Transportation Needs: Not on file  Physical Activity: Not on file  Stress: Not on file  Social Connections: Not on file  Intimate Partner Violence: Not on file    Family History  Problem Relation Age of Onset   Lung cancer Neg Hx      Review of Systems  Constitutional: Negative.  Negative for chills and fever.  HENT: Negative.  Negative for congestion and sore throat.   Respiratory: Negative.  Negative for cough and shortness of breath.   Cardiovascular:  Negative for chest pain and palpitations.  Gastrointestinal:  Negative for abdominal pain, diarrhea, nausea and vomiting.  Genitourinary: Negative.  Negative for dysuria and hematuria.  Musculoskeletal: Negative.  Negative for myalgias.  Skin: Negative.  Negative for rash.  Neurological:  Negative for dizziness and headaches.  All other systems reviewed and are negative.   Physical Exam Vitals reviewed.  Constitutional:      Appearance: Normal appearance.  HENT:     Head: Normocephalic.  Eyes:     Extraocular Movements: Extraocular movements intact.     Pupils: Pupils are equal, round, and reactive to light.  Cardiovascular:     Rate and Rhythm: Normal rate and regular rhythm.     Pulses: Normal pulses.     Heart sounds: Normal heart sounds.  Pulmonary:     Effort: Pulmonary effort is normal.     Breath sounds: Normal breath sounds.  Abdominal:     Palpations: Abdomen is soft.     Tenderness: There is no abdominal tenderness.  Musculoskeletal:     Cervical back: No tenderness.  Lymphadenopathy:     Cervical: No cervical adenopathy.  Skin:    General: Skin is warm and dry.     Capillary Refill: Capillary refill takes less than 2 seconds.  Neurological:     General: No focal deficit present.     Mental Status: He is alert and oriented to person, place, and time.  Psychiatric:        Mood and Affect: Mood normal.        Behavior: Behavior normal.     ASSESSMENT & PLAN: Hypertension  associated with type 2 diabetes mellitus ([place]) Well-controlled hypertension. BP Readings from Last 3 Encounters:  01/16/21 120/82  01/10/21 (!) 142/64  01/04/21 133/75  Continue amlodipine and lisinopril. Diabetes  A1c still not at goal which should be less than 8, on metformin, glipizide, and Januvia.  Continue present medications.  Do not want to risk hypoglycemia. Lab Results  Component Value Date   HGBA1C 8.6 (H) 01/07/2021     E. coli urinary tract infection Much improved.  Just finished antibiotics.  Will repeat culture today. Clinically stable.  No medical concerns identified during this visit. Continue present medications.  No changes.  [place] was seen today for hospitalization follow-up.  Diagnoses and all orders for this visit:  Hospital discharge follow-up -     Urine Culture  Flu vaccine need -     Flu Vaccine QUAD High Dose([facility])  E. coli urinary tract infection Comments: Much improved Orders: -     CBC with Differential/Platelet -     Comprehensive metabolic panel -     Urine Culture  Primary malignant neoplasm of lung metastatic to other site, unspecified laterality ([place]) -     Urine Culture  Hypertension associated with type 2 diabetes mellitus ([place]) -     Comprehensive metabolic panel -     Urine Culture -     Hemoglobin A1c  Dyslipidemia associated with type 2 diabetes mellitus ([place]) -     Urine Culture  Metastatic cancer to brain [facility])  Atherosclerosis of aorta [facility])  Patient Instructions  Health Maintenance After Age 61 After age 34, you are at a higher risk for certain long-term diseases and infections as well as injuries from falls. Falls are a major cause of broken bones and head injuries in people who are older than age 21. Getting regular preventive care can help to keep you healthy and well. Preventive care includes getting regular testing and making lifestyle changes as recommended by your health care provider. Talk with your health care  provider about: Which screenings and tests you should have. A screening is a test that checks for a disease when you have no symptoms. A diet and exercise plan that is right for you. What should I know about screenings and tests to prevent falls? Screening and testing are the best ways to find a health problem early. Early diagnosis and treatment give you the best chance of managing medical conditions that are common after age 78. Certain conditions and lifestyle choices may make you more likely to have a fall. Your health care provider may recommend: Regular vision checks. Poor vision and conditions such as cataracts can make you more likely to have a fall. If you wear glasses, make sure to get your prescription updated if your vision changes. Medicine review. Work with your health care provider to regularly review all of the medicines you are taking, including over-the-counter medicines. Ask your health care provider about any side effects that may make you more likely to have a fall. Tell your health care provider if any medicines that you take make you feel dizzy or sleepy. Osteoporosis screening. Osteoporosis is a condition that causes the bones to get weaker. This can make the bones weak and cause them to break more easily. Blood pressure screening. Blood pressure changes and medicines to control blood pressure can make you feel dizzy. Strength and balance checks. Your health care provider may recommend certain tests to check your strength and balance while standing, walking, or changing positions. Foot health exam. Foot pain and numbness, as well as not wearing proper footwear, can make you more likely to have a fall. Depression screening. You may be more likely to have a fall  if you have a fear of falling, feel emotionally low, or feel unable to do activities that you used to do. Alcohol use screening. Using too much alcohol can affect your balance and may make you more likely to have a  fall. What actions can I take to lower my risk of falls? General instructions Talk with your health care provider about your risks for falling. Tell your health care provider if: You fall. Be sure to tell your health care provider about all falls, even ones that seem minor. You feel dizzy, sleepy, or off-balance. Take over-the-counter and prescription medicines only as told by your health care provider. These include any supplements. Eat a healthy diet and maintain a healthy weight. A healthy diet includes low-fat dairy products, low-fat (lean) meats, and fiber from whole grains, beans, and lots of fruits and vegetables. Home safety Remove any tripping hazards, such as rugs, cords, and clutter. Install safety equipment such as grab bars in bathrooms and safety rails on stairs. Keep rooms and walkways well-lit. Activity  Follow a regular exercise program to stay fit. This will help you maintain your balance. Ask your health care provider what types of exercise are appropriate for you. If you need a cane or walker, use it as recommended by your health care provider. Wear supportive shoes that have nonskid soles. Lifestyle Do not drink alcohol if your health care provider tells you not to drink. If you drink alcohol, limit how much you have: 0-1 drink a day for women. 0-2 drinks a day for men. Be aware of how much alcohol is in your drink. In the [place] one drink equals one typical bottle of beer (12 oz), one-half glass of wine (5 oz), or one shot of hard liquor (1 oz). Do not use any products that contain nicotine or tobacco, such as cigarettes and e-cigarettes. If you need help quitting, ask your health care provider. Summary Having a healthy lifestyle and getting preventive care can help to protect your health and wellness after age 24. Screening and testing are the best way to find a health problem early and help you avoid having a fall. Early diagnosis and treatment give you the best  chance for managing medical conditions that are more common for people who are older than age 13. Falls are a major cause of broken bones and head injuries in people who are older than age 13. Take precautions to prevent a fall at home. Work with your health care provider to learn what changes you can make to improve your health and wellness and to prevent falls. This information is not intended to replace advice given to you by your health care provider. Make sure you discuss any questions you have with your health care provider. Document Revised: 06/29/2020 Document Reviewed: 04/06/2020 Elsevier Patient Education  2022 [place], MD [facility] [address]

## 2021-01-16 NOTE — Assessment & Plan Note (Signed)
Much improved.  Just finished antibiotics.  Will repeat culture today.

## 2021-01-16 NOTE — Assessment & Plan Note (Signed)
Well-controlled hypertension. BP Readings from Last 3 Encounters:  01/16/21 120/82  01/10/21 (!) 142/64  01/04/21 133/75  Continue amlodipine and lisinopril. Diabetes A1c still not at goal which should be less than 8, on metformin, glipizide, and Januvia.  Continue present medications.  Do not want to risk hypoglycemia. Lab Results  Component Value Date   HGBA1C 8.6 (H) 01/07/2021

## 2021-01-16 NOTE — Patient Instructions (Signed)
Health Maintenance After Age 73 After age 73, you are at a higher risk for certain long-term diseases and infections as well as injuries from falls. Falls are a major cause of broken bones and head injuries in people who are older than age 73. Getting regular preventive care can help to keep you healthy and well. Preventive care includes getting regular testing and making lifestyle changes as recommended by your health care provider. Talk with your health care provider about: Which screenings and tests you should have. A screening is a test that checks for a disease when you have no symptoms. A diet and exercise plan that is right for you. What should I know about screenings and tests to prevent falls? Screening and testing are the best ways to find a health problem early. Early diagnosis and treatment give you the best chance of managing medical conditions that are common after age 73. Certain conditions and lifestyle choices may make you more likely to have a fall. Your health care provider may recommend: Regular vision checks. Poor vision and conditions such as cataracts can make you more likely to have a fall. If you wear glasses, make sure to get your prescription updated if your vision changes. Medicine review. Work with your health care provider to regularly review all of the medicines you are taking, including over-the-counter medicines. Ask your health care provider about any side effects that may make you more likely to have a fall. Tell your health care provider if any medicines that you take make you feel dizzy or sleepy. Osteoporosis screening. Osteoporosis is a condition that causes the bones to get weaker. This can make the bones weak and cause them to break more easily. Blood pressure screening. Blood pressure changes and medicines to control blood pressure can make you feel dizzy. Strength and balance checks. Your health care provider may recommend certain tests to check your strength and  balance while standing, walking, or changing positions. Foot health exam. Foot pain and numbness, as well as not wearing proper footwear, can make you more likely to have a fall. Depression screening. You may be more likely to have a fall if you have a fear of falling, feel emotionally low, or feel unable to do activities that you used to do. Alcohol use screening. Using too much alcohol can affect your balance and may make you more likely to have a fall. What actions can I take to lower my risk of falls? General instructions Talk with your health care provider about your risks for falling. Tell your health care provider if: You fall. Be sure to tell your health care provider about all falls, even ones that seem minor. You feel dizzy, sleepy, or off-balance. Take over-the-counter and prescription medicines only as told by your health care provider. These include any supplements. Eat a healthy diet and maintain a healthy weight. A healthy diet includes low-fat dairy products, low-fat (lean) meats, and fiber from whole grains, beans, and lots of fruits and vegetables. Home safety Remove any tripping hazards, such as rugs, cords, and clutter. Install safety equipment such as grab bars in bathrooms and safety rails on stairs. Keep rooms and walkways well-lit. Activity  Follow a regular exercise program to stay fit. This will help you maintain your balance. Ask your health care provider what types of exercise are appropriate for you. If you need a cane or walker, use it as recommended by your health care provider. Wear supportive shoes that have nonskid soles. Lifestyle Do not   drink alcohol if your health care provider tells you not to drink. If you drink alcohol, limit how much you have: 0-1 drink a day for women. 0-2 drinks a day for men. Be aware of how much alcohol is in your drink. In the [place] one drink equals one typical bottle of beer (12 oz), one-half glass of wine (5 oz), or one shot of  hard liquor (1 oz). Do not use any products that contain nicotine or tobacco, such as cigarettes and e-cigarettes. If you need help quitting, ask your health care provider. Summary Having a healthy lifestyle and getting preventive care can help to protect your health and wellness after age 73. Screening and testing are the best way to find a health problem early and help you avoid having a fall. Early diagnosis and treatment give you the best chance for managing medical conditions that are more common for people who are older than age 73. Falls are a major cause of broken bones and head injuries in people who are older than age 73. Take precautions to prevent a fall at home. Work with your health care provider to learn what changes you can make to improve your health and wellness and to prevent falls. This information is not intended to replace advice given to you by your health care provider. Make sure you discuss any questions you have with your health care provider. Document Revised: 06/29/2020 Document Reviewed: 04/06/2020 Elsevier Patient Education  2022 [employer]  

## 2021-01-17 ENCOUNTER — Encounter: Payer: Self-pay | Admitting: Hematology and Oncology

## 2021-01-17 DIAGNOSIS — I10 Essential (primary) hypertension: Secondary | ICD-10-CM | POA: Diagnosis not present

## 2021-01-17 DIAGNOSIS — D630 Anemia in neoplastic disease: Secondary | ICD-10-CM | POA: Diagnosis not present

## 2021-01-17 DIAGNOSIS — N390 Urinary tract infection, site not specified: Secondary | ICD-10-CM | POA: Diagnosis not present

## 2021-01-17 DIAGNOSIS — C3490 Malignant neoplasm of unspecified part of unspecified bronchus or lung: Secondary | ICD-10-CM | POA: Diagnosis not present

## 2021-01-17 DIAGNOSIS — C7931 Secondary malignant neoplasm of brain: Secondary | ICD-10-CM | POA: Diagnosis not present

## 2021-01-17 DIAGNOSIS — E119 Type 2 diabetes mellitus without complications: Secondary | ICD-10-CM | POA: Diagnosis not present

## 2021-01-17 LAB — HEMOGLOBIN A1C: Hgb A1c MFr Bld: 8.6 % — ABNORMAL HIGH (ref 4.6–6.5)

## 2021-01-18 ENCOUNTER — Other Ambulatory Visit: Payer: Self-pay | Admitting: Emergency Medicine

## 2021-01-18 LAB — URINE CULTURE

## 2021-01-18 MED ORDER — CIPROFLOXACIN HCL 500 MG PO TABS: 500.0000 mg | ORAL_TABLET | Freq: Two times a day (BID) | ORAL | 0 refills | Status: AC

## 2021-01-18 NOTE — Progress Notes (Signed)
Call patient please.  Urine culture positive for Pseudomonas bacteria.  Needs to start taking Cipro 500 mg twice a day for 7 days.  New prescription sent to pharmacy of record today.

## 2021-01-21 DIAGNOSIS — C7931 Secondary malignant neoplasm of brain: Secondary | ICD-10-CM | POA: Diagnosis not present

## 2021-01-21 DIAGNOSIS — C3490 Malignant neoplasm of unspecified part of unspecified bronchus or lung: Secondary | ICD-10-CM | POA: Diagnosis not present

## 2021-01-21 DIAGNOSIS — I10 Essential (primary) hypertension: Secondary | ICD-10-CM | POA: Diagnosis not present

## 2021-01-21 DIAGNOSIS — E119 Type 2 diabetes mellitus without complications: Secondary | ICD-10-CM | POA: Diagnosis not present

## 2021-01-21 DIAGNOSIS — D630 Anemia in neoplastic disease: Secondary | ICD-10-CM | POA: Diagnosis not present

## 2021-01-21 DIAGNOSIS — N390 Urinary tract infection, site not specified: Secondary | ICD-10-CM | POA: Diagnosis not present

## 2021-01-22 ENCOUNTER — Telehealth: Payer: Self-pay

## 2021-01-22 NOTE — Telephone Encounter (Signed)
-----   Message from [facility], MD sent at 01/18/2021  2:56 PM EDT ----- Call patient please.  Urine culture positive for Pseudomonas bacteria.  Needs to start taking Cipro 500 mg twice a day for 7 days.  New prescription sent to pharmacy of record today.

## 2021-01-22 NOTE — Telephone Encounter (Signed)
Informed patients sister of results and recommendations.

## 2021-01-23 DIAGNOSIS — E119 Type 2 diabetes mellitus without complications: Secondary | ICD-10-CM | POA: Diagnosis not present

## 2021-01-23 DIAGNOSIS — C3490 Malignant neoplasm of unspecified part of unspecified bronchus or lung: Secondary | ICD-10-CM | POA: Diagnosis not present

## 2021-01-23 DIAGNOSIS — C7931 Secondary malignant neoplasm of brain: Secondary | ICD-10-CM | POA: Diagnosis not present

## 2021-01-23 DIAGNOSIS — N390 Urinary tract infection, site not specified: Secondary | ICD-10-CM | POA: Diagnosis not present

## 2021-01-23 DIAGNOSIS — I10 Essential (primary) hypertension: Secondary | ICD-10-CM | POA: Diagnosis not present

## 2021-01-23 DIAGNOSIS — D630 Anemia in neoplastic disease: Secondary | ICD-10-CM | POA: Diagnosis not present

## 2021-01-24 ENCOUNTER — Encounter: Payer: Self-pay | Admitting: Nurse Practitioner

## 2021-01-24 ENCOUNTER — Other Ambulatory Visit: Payer: Self-pay

## 2021-01-24 ENCOUNTER — Ambulatory Visit (INDEPENDENT_AMBULATORY_CARE_PROVIDER_SITE_OTHER): Payer: Medicare Other | Admitting: Nurse Practitioner

## 2021-01-24 VITALS — BP 120/72 | HR 96 | Temp 98.4°F | Ht 66.0 in | Wt 152.0 lb

## 2021-01-24 DIAGNOSIS — E119 Type 2 diabetes mellitus without complications: Secondary | ICD-10-CM | POA: Diagnosis not present

## 2021-01-24 DIAGNOSIS — H5789 Other specified disorders of eye and adnexa: Secondary | ICD-10-CM | POA: Diagnosis not present

## 2021-01-24 DIAGNOSIS — C3490 Malignant neoplasm of unspecified part of unspecified bronchus or lung: Secondary | ICD-10-CM | POA: Diagnosis not present

## 2021-01-24 DIAGNOSIS — C7931 Secondary malignant neoplasm of brain: Secondary | ICD-10-CM | POA: Diagnosis not present

## 2021-01-24 DIAGNOSIS — N390 Urinary tract infection, site not specified: Secondary | ICD-10-CM | POA: Diagnosis not present

## 2021-01-24 DIAGNOSIS — H409 Unspecified glaucoma: Secondary | ICD-10-CM

## 2021-01-24 DIAGNOSIS — L853 Xerosis cutis: Secondary | ICD-10-CM

## 2021-01-24 DIAGNOSIS — D630 Anemia in neoplastic disease: Secondary | ICD-10-CM | POA: Diagnosis not present

## 2021-01-24 DIAGNOSIS — I10 Essential (primary) hypertension: Secondary | ICD-10-CM | POA: Diagnosis not present

## 2021-01-24 MED ORDER — LATANOPROST 0.005 % OP SOLN: 1.0000 [drp] | Freq: Every day | OPHTHALMIC | 3 refills | Status: DC

## 2021-01-24 MED ORDER — AMMONIUM LACTATE 12 % EX LOTN: 1.0000 "application " | TOPICAL_LOTION | CUTANEOUS | 0 refills | Status: DC | PRN

## 2021-01-24 MED ORDER — ERYTHROMYCIN 5 MG/GM OP OINT: 1.0000 "application " | TOPICAL_OINTMENT | Freq: Every day | OPHTHALMIC | 0 refills | Status: DC

## 2021-01-24 NOTE — Progress Notes (Signed)
Subjective:  Patient ID: Benjamin Life., male    DOB: [birth year]  Age: 75 y.o. MRN: [id number]  CC:  Chief Complaint  Patient presents with   Facial Swelling    Pt states (L) side face was swollen this am      HPI  This patient arrives today for the above.  He is accompanied by his daughter.  They tell me the patient woke up in the left eyelid was swollen.  As the day has gone on the swelling has vastly improved.  The patient denies any visual changes, pain to the eyelid or pain to the eyeball, discharge from the eye, redness of the eye, pain with movement of the eye, photophobia.  Patient's daughter is asking for refill on patient's latanoprost eyedrops and ammonium lactate lotion for his dry skin.  Past Medical History:  Diagnosis Date   Allergy    Diabetes mellitus without complication ([place])    Type II   Glaucoma 10/16/2020   History of radiation therapy    [facility]- SBRT 02/21/19-02/28/19, [facility]- IMRT 02/29/20-04/15/20 Dr. Gery Pray   History of radiation therapy 10/30/2020   whole brain 10/17/2020-10/30/2020 Dr Gery Pray   Hyperlipidemia    Hypertension    Lung cancer ([place])    Stage IA2 (cT1b, N0) non-small cell lung cancer (adenocarcinoma)      Family History  Problem Relation Age of Onset   Lung cancer Neg Hx     Social History   Social History Narrative   Not on file   Social History   Tobacco Use   Smoking status: Former    Packs/day: 1.00    Years: 48.00    Pack years: 48.00    Types: Cigarettes    Quit date: 02/20/2015    Years since quitting: 5.9   Smokeless tobacco: Former    Quit date: 10/2013  Substance Use Topics   Alcohol use: No     Current Meds  Medication Sig   acetaminophen (TYLENOL) 500 MG tablet Take 1,000 mg by mouth every 6 (six) hours as needed for mild pain.   albuterol (VENTOLIN HFA) 108 (90 Base) MCG/ACT inhaler Inhale 2 puffs into the lungs every 6 (six) hours as needed for wheezing or shortness of breath.    amLODipine (NORVASC) 10 MG tablet Take 1 tablet (10 mg total) by mouth daily.   ammonium lactate (AMLACTIN) 12 % lotion Apply 1 application topically as needed for dry skin.   blood glucose meter kit and supplies KIT Dispense based on patient and insurance preference. Use up to four times daily as directed.   Blood Pressure Monitoring (BLOOD PRESSURE MONITOR/ARM) DEVI Check BP once a day. DX I 10   cetirizine (ZYRTEC) 10 MG tablet Take 1 tablet (10 mg total) by mouth daily. (Patient taking differently: Take 10 mg by mouth daily as needed for allergies.)   ciprofloxacin (CIPRO) 500 MG tablet Take 1 tablet (500 mg total) by mouth 2 (two) times daily for 7 days.   erythromycin ophthalmic ointment Place 1 application into the left eye at bedtime.   folic acid (FOLVITE) 1 MG tablet Take 1 tablet (1 mg total) by mouth daily.   gabapentin (NEURONTIN) 300 MG capsule Take 1 capsule (300 mg total) by mouth at bedtime.   glipiZIDE (GLUCOTROL) 5 MG tablet TAKE 1 TABLET BY MOUTH TWICE DAILY WITH A MEAL (Patient taking differently: Take 5 mg by mouth 2 (two) times daily before a meal.)   levETIRAcetam (KEPPRA)  1000 MG tablet Take 1 tablet (1,000 mg total) by mouth 2 (two) times daily.   lidocaine-prilocaine (EMLA) cream Apply 1 application topically as needed. (Patient taking differently: Apply 1 application topically as needed (for port before tx).)   metFORMIN (GLUCOPHAGE) 500 MG tablet TAKE 1 TABLET BY MOUTH TWICE DAILY WITH MEALS (Patient taking differently: Take 500 mg by mouth daily with breakfast.)   Multiple Vitamins-Minerals (MULTIVITAMIN MEN) TABS Take 1 tablet by mouth daily.   ondansetron (ZOFRAN) 8 MG tablet Take 1 tablet (8 mg total) by mouth every 8 (eight) hours as needed for nausea or vomiting.   prochlorperazine (COMPAZINE) 10 MG tablet Take 1 tablet (10 mg total) by mouth every 6 (six) hours as needed for nausea or vomiting.   [DISCONTINUED] latanoprost (XALATAN) 0.005 % ophthalmic solution  Place 1 drop into both eyes at bedtime.    ROS:  Review of Systems  Constitutional:  Negative for chills, diaphoresis, fever and malaise/fatigue.  Eyes:  Negative for blurred vision, double vision, photophobia, pain, discharge and redness.       (+) tenderness to eyelid this AM, (+) swelling to eyelid this AM    Objective:   Today's Vitals: BP 120/72 (BP Location: Left Arm)   Pulse 96   Temp 98.4 F (36.9 C) (Oral)   Ht $R'5\' 6"'nX$  (1.676 m)   Wt 152 lb (68.9 kg)   SpO2 96%   BMI 24.53 kg/m  Vitals with BMI 01/24/2021 01/16/2021 01/10/2021  Height $Remov'5\' 6"'qQfrAy$  $Remove'6\' 0"'WbsfJKE$  -  Weight 152 lbs 150 lbs -  BMI 42.59 56.38 -  Systolic 756 433 295  Diastolic 72 82 64  Pulse 96 88 81     Physical Exam Vitals reviewed.  Constitutional:      Appearance: Normal appearance.  HENT:     Head: Normocephalic and atraumatic.  Eyes:     General: Lids are normal. Vision grossly intact. Gaze aligned appropriately. No visual field deficit or scleral icterus.       Right eye: No foreign body, discharge or hordeolum.        Left eye: No foreign body, discharge or hordeolum.     Extraocular Movements: Extraocular movements intact.     Conjunctiva/sclera: Conjunctivae normal.     Comments: Bilateral eyes: PERRLA, peripheral vision intact via visual field test, no double vision  Cardiovascular:     Rate and Rhythm: Normal rate and regular rhythm.  Pulmonary:     Effort: Pulmonary effort is normal.     Breath sounds: Normal breath sounds.  Musculoskeletal:     Cervical back: Neck supple.  Skin:    General: Skin is warm and dry.  Neurological:     Mental Status: He is alert and oriented to person, place, and time.  Psychiatric:        Mood and Affect: Mood normal.        Behavior: Behavior normal.        Thought Content: Thought content normal.        Judgment: Judgment normal.         Assessment and Plan   1. Eye swelling, left   2. Dry skin   3. Glaucoma of both eyes, unspecified glaucoma type       Plan: 1.  History and physical exam very reassuring.  I did recommend the patient follow-up with his eye doctor routinely for further evaluation.  We did discuss red flag symptoms of eye abnormalities such as pain of the eyeball, pain with  movement of the eye, pain and swelling of the eyelid, or acute visual changes.  We will trial erythromycin ointment that the patient can apply 5 minutes or more after using his eyedrops at night for the next few days to make sure the swelling does not return.  Patient feeling our told to call the office if symptoms return and/or proceed to the emergency department if symptoms return or he experiences the red flag symptoms we discussed.  They tell me they understand. 2.  Ammonium lactate lotion ordered today. 3.  Refill latanoprost eyedrops ordered today as well.   Tests ordered No orders of the defined types were placed in this encounter.     Meds ordered this encounter  Medications   latanoprost (XALATAN) 0.005 % ophthalmic solution    Sig: Place 1 drop into both eyes at bedtime.    Dispense:  2.5 mL    Refill:  3    Order Specific Question:   Supervising Provider    Answer:   Binnie Rail [[id number]]   erythromycin ophthalmic ointment    Sig: Place 1 application into the left eye at bedtime.    Dispense:  3.5 g    Refill:  0    Order Specific Question:   Supervising Provider    Answer:   BURNS, Claudina Lick [[id number]]   ammonium lactate (AMLACTIN) 12 % lotion    Sig: Apply 1 application topically as needed for dry skin.    Dispense:  400 g    Refill:  0    Order Specific Question:   Supervising Provider    Answer:   Binnie Rail [id number]    Patient to follow-up as scheduled with PCP or sooner as needed.  Ailene Ards, NP

## 2021-01-24 NOTE — Patient Instructions (Signed)
Call our office or proceed to the ER if you experience the following: Pain in the eyeball, visual changes/loss, pain/swelling of eyelid accompanied by eyeball pain/pain with moving eye, if swelling persists despite the antibiotic ointment.

## 2021-01-25 ENCOUNTER — Inpatient Hospital Stay: Payer: Medicare Other

## 2021-01-25 ENCOUNTER — Inpatient Hospital Stay (HOSPITAL_BASED_OUTPATIENT_CLINIC_OR_DEPARTMENT_OTHER): Payer: Medicare Other | Admitting: Hematology and Oncology

## 2021-01-25 VITALS — BP 129/88 | HR 70 | Temp 97.1°F | Resp 17 | Ht 66.0 in | Wt 151.6 lb

## 2021-01-25 DIAGNOSIS — C7931 Secondary malignant neoplasm of brain: Secondary | ICD-10-CM | POA: Insufficient documentation

## 2021-01-25 DIAGNOSIS — C3491 Malignant neoplasm of unspecified part of right bronchus or lung: Secondary | ICD-10-CM | POA: Diagnosis not present

## 2021-01-25 DIAGNOSIS — D701 Agranulocytosis secondary to cancer chemotherapy: Secondary | ICD-10-CM | POA: Diagnosis not present

## 2021-01-25 DIAGNOSIS — Z5111 Encounter for antineoplastic chemotherapy: Secondary | ICD-10-CM | POA: Insufficient documentation

## 2021-01-25 DIAGNOSIS — Z79899 Other long term (current) drug therapy: Secondary | ICD-10-CM | POA: Diagnosis not present

## 2021-01-25 DIAGNOSIS — Z5112 Encounter for antineoplastic immunotherapy: Secondary | ICD-10-CM | POA: Diagnosis not present

## 2021-01-25 DIAGNOSIS — C3490 Malignant neoplasm of unspecified part of unspecified bronchus or lung: Secondary | ICD-10-CM

## 2021-01-25 DIAGNOSIS — E032 Hypothyroidism due to medicaments and other exogenous substances: Secondary | ICD-10-CM

## 2021-01-25 DIAGNOSIS — D6481 Anemia due to antineoplastic chemotherapy: Secondary | ICD-10-CM | POA: Insufficient documentation

## 2021-01-25 DIAGNOSIS — Z95828 Presence of other vascular implants and grafts: Secondary | ICD-10-CM

## 2021-01-25 LAB — CBC WITH DIFFERENTIAL (CANCER CENTER ONLY)
Abs Immature Granulocytes: 0.01 10*3/uL (ref 0.00–0.07)
Basophils Absolute: 0 10*3/uL (ref 0.0–0.1)
Basophils Relative: 1 %
Eosinophils Absolute: 0 10*3/uL (ref 0.0–0.5)
Eosinophils Relative: 1 %
HCT: 31.7 % — ABNORMAL LOW (ref 39.0–52.0)
Hemoglobin: 10 g/dL — ABNORMAL LOW (ref 13.0–17.0)
Immature Granulocytes: 0 %
Lymphocytes Relative: 22 %
Lymphs Abs: 0.7 10*3/uL (ref 0.7–4.0)
MCH: 31 pg (ref 26.0–34.0)
MCHC: 31.5 g/dL (ref 30.0–36.0)
MCV: 98.1 fL (ref 80.0–100.0)
Monocytes Absolute: 0.6 10*3/uL (ref 0.1–1.0)
Monocytes Relative: 20 %
Neutro Abs: 1.7 10*3/uL (ref 1.7–7.7)
Neutrophils Relative %: 56 %
Platelet Count: 392 10*3/uL (ref 150–400)
RBC: 3.23 MIL/uL — ABNORMAL LOW (ref 4.22–5.81)
RDW: 14.2 % (ref 11.5–15.5)
WBC Count: 3 10*3/uL — ABNORMAL LOW (ref 4.0–10.5)
nRBC: 0 % (ref 0.0–0.2)

## 2021-01-25 LAB — CMP (CANCER CENTER ONLY)
ALT: 13 U/L (ref 0–44)
AST: 15 U/L (ref 15–41)
Albumin: 3.6 g/dL (ref 3.5–5.0)
Alkaline Phosphatase: 69 U/L (ref 38–126)
Anion gap: 10 (ref 5–15)
BUN: 8 mg/dL (ref 8–23)
CO2: 26 mmol/L (ref 22–32)
Calcium: 9.4 mg/dL (ref 8.9–10.3)
Chloride: 108 mmol/L (ref 98–111)
Creatinine: 0.78 mg/dL (ref 0.61–1.24)
GFR, Estimated: >60 mL/min (ref >60)
Glucose, Bld: 196 mg/dL — ABNORMAL HIGH (ref 70–99)
Potassium: 3.8 mmol/L (ref 3.5–5.1)
Sodium: 144 mmol/L (ref 135–145)
Total Bilirubin: 0.4 mg/dL (ref 0.3–1.2)
Total Protein: 6.4 g/dL — ABNORMAL LOW (ref 6.5–8.1)

## 2021-01-25 LAB — TSH: TSH: 1.023 u[IU]/mL (ref 0.320–4.118)

## 2021-01-25 MED ORDER — SODIUM CHLORIDE 0.9% FLUSH
10.0000 mL | Freq: Once | INTRAVENOUS | Status: AC
  Administered 2021-01-25: 10 mL

## 2021-01-25 MED ORDER — SODIUM CHLORIDE 0.9% FLUSH
10.0000 mL | INTRAVENOUS | Status: DC | PRN
  Administered 2021-01-25: 10 mL

## 2021-01-25 MED ORDER — SODIUM CHLORIDE 0.9 % IV SOLN
500.0000 mg/m2 | Freq: Once | INTRAVENOUS | Status: AC
  Administered 2021-01-25: 900 mg via INTRAVENOUS
  Filled 2021-01-25: qty 20

## 2021-01-25 MED ORDER — SODIUM CHLORIDE 0.9 % IV SOLN
458.0000 mg | Freq: Once | INTRAVENOUS | Status: AC
  Administered 2021-01-25: 460 mg via INTRAVENOUS
  Filled 2021-01-25: qty 46

## 2021-01-25 MED ORDER — SODIUM CHLORIDE 0.9 % IV SOLN: Freq: Once | INTRAVENOUS | Status: AC

## 2021-01-25 MED ORDER — CYANOCOBALAMIN 1000 MCG/ML IJ SOLN
1000.0000 ug | Freq: Once | INTRAMUSCULAR | Status: AC
  Administered 2021-01-25: 1000 ug via INTRAMUSCULAR
  Filled 2021-01-25: qty 1

## 2021-01-25 MED ORDER — PALONOSETRON HCL INJECTION 0.25 MG/5ML
0.2500 mg | Freq: Once | INTRAVENOUS | Status: AC
  Administered 2021-01-25: 0.25 mg via INTRAVENOUS
  Filled 2021-01-25: qty 5

## 2021-01-25 MED ORDER — SODIUM CHLORIDE 0.9 % IV SOLN
200.0000 mg | Freq: Once | INTRAVENOUS | Status: AC
  Administered 2021-01-25: 200 mg via INTRAVENOUS
  Filled 2021-01-25: qty 8

## 2021-01-25 MED ORDER — HEPARIN SOD (PORK) LOCK FLUSH 100 UNIT/ML IV SOLN
500.0000 [IU] | Freq: Once | INTRAVENOUS | Status: AC | PRN
  Administered 2021-01-25: 500 [IU]

## 2021-01-25 MED ORDER — SODIUM CHLORIDE 0.9 % IV SOLN
150.0000 mg | Freq: Once | INTRAVENOUS | Status: AC
  Administered 2021-01-25: 150 mg via INTRAVENOUS
  Filled 2021-01-25: qty 150

## 2021-01-25 MED ORDER — SODIUM CHLORIDE 0.9 % IV SOLN
10.0000 mg | Freq: Once | INTRAVENOUS | Status: AC
  Administered 2021-01-25: 10 mg via INTRAVENOUS
  Filled 2021-01-25: qty 10

## 2021-01-25 NOTE — Progress Notes (Signed)
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  This was followed by an MRI of the brain with and without contrast which showed 4 brain lesions consistent with metastases, extensive vasogenic edema associated with the posterior right frontal lesion without midline shift. 6/15-6/28/2022: 10 fractions of WBRT with Rad/Onc 11/23/2020: Guardant 360 returns with no targetable mutation.  12/14/2020: Cycle 1 Day 1 of Carbo/Pem/Pem 01/04/2021: Cycle 2 Day 1 of Carbo/Pem/Pem 01/25/2021: Cycle 2 Day 1 of Carbo/Pem/Pem  Interval History:  Benjamin Willis. 75 y.o. 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 The patient's last visit was on 01/04/2021.  In the interim since the last visit he has completed Cycle 2 of chemotherapy. He presents today for Cycle 3.   On exam today Benjamin Willis is unaccompanied.  He reports he has been tolerating chemotherapy treatment well without any major issues.  He reports that his appetite has been good and he is even even walking to the mailbox with his walker.  His energy levels have been good and he denies any vision changes or headache.  He also denies any shortness of breath or chest pain.  He currently denies any fevers, chills, sweats, nausea, vomiting or diarrhea.   Full 10 point ROS is listed below.  MEDICAL HISTORY:  Past Medical History:  Diagnosis Date   Allergy    Diabetes mellitus without complication ([place])    Type II   Glaucoma 10/16/2020   History of radiation therapy    [place]- SBRT 02/21/19-02/28/19, Rt Lung- IMRT 02/29/20-04/15/20 Dr. Gery Pray   History of radiation therapy 10/30/2020   whole brain 10/17/2020-10/30/2020 Dr Gery Pray   Hyperlipidemia    Hypertension    Lung cancer ([place])    Stage IA2 (cT1b, N0) non-small cell lung cancer (adenocarcinoma)    SURGICAL HISTORY: Past Surgical History:  Procedure Laterality Date   ELECTROMAGNETIC NAVIGATION BROCHOSCOPY Right 02/02/2019   Procedure: VIDEO BRONCHOSCOPY WITH NAVIGATION AND FIDUCIAL PLACEMENT;  Surgeon: Garner Nash, DO;  Location: [facility];  Service: Cardiopulmonary;  Laterality: Right;   FUDUCIAL PLACEMENT Right 02/02/2019   Procedure: Placement Of Fuducial Right Upper Lobe;  Surgeon: Garner Nash, DO;  Location: [facility];  Service: Cardiopulmonary;  Laterality: Right;   IR IMAGING GUIDED PORT INSERTION  12/13/2020   none     VIDEO BRONCHOSCOPY WITH RADIAL ENDOBRONCHIAL ULTRASOUND Right 02/02/2019   Procedure: Video Bronchoscopy With Radial Endobronchial Ultrasound;  Surgeon: Garner Nash, DO;  Location: [facility];  Service: Cardiopulmonary;  Laterality: Right;    SOCIAL HISTORY: Social History   Socioeconomic History   Marital status: Single    Spouse name: Not on file   Number of children: 0   Years of education: Not on file   Highest education level: 9th grade  Occupational History   Not on file  Tobacco Use   Smoking status: Former    Packs/day:  1.00    Years: 48.00    Pack years: 48.00    Types: Cigarettes    Quit date: 02/20/2015    Years since quitting: 5.9   Smokeless tobacco: Former    Quit date: 10/2013  Vaping Use   Vaping Use: Never used  Substance and Sexual Activity   Alcohol use: No   Drug use: No   Sexual activity: Yes    Birth  control/protection: Condom  Other Topics Concern   Not on file  Social History Narrative   Not on file   Social Determinants of Health   Financial Resource Strain: Not on file  Food Insecurity: Not on file  Transportation Needs: Not on file  Physical Activity: Not on file  Stress: Not on file  Social Connections: Not on file  Intimate Partner Violence: Not on file    FAMILY HISTORY: Family History  Problem Relation Age of Onset   Lung cancer Neg Hx     ALLERGIES:  has No Known Allergies.  MEDICATIONS:  Current Outpatient Medications  Medication Sig Dispense Refill   acetaminophen (TYLENOL) 500 MG tablet Take 1,000 mg by mouth every 6 (six) hours as needed for mild pain.     albuterol (VENTOLIN HFA) 108 (90 Base) MCG/ACT inhaler Inhale 2 puffs into the lungs every 6 (six) hours as needed for wheezing or shortness of breath. 18 g 6   amLODipine (NORVASC) 10 MG tablet Take 1 tablet (10 mg total) by mouth daily. 90 tablet 3   ammonium lactate (AMLACTIN) 12 % lotion Apply 1 application topically as needed for dry skin. 400 g 0   blood glucose meter kit and supplies KIT Dispense based on patient and insurance preference. Use up to four times daily as directed. 1 each 0   Blood Pressure Monitoring (BLOOD PRESSURE MONITOR/ARM) DEVI Check BP once a day. DX I 10 1 each 0   cetirizine (ZYRTEC) 10 MG tablet Take 1 tablet (10 mg total) by mouth daily. (Patient taking differently: Take 10 mg by mouth daily as needed for allergies.) 30 tablet 11   ciprofloxacin (CIPRO) 500 MG tablet Take 1 tablet (500 mg total) by mouth 2 (two) times daily for 7 days. 14 tablet 0   erythromycin ophthalmic ointment Place 1 application into the left eye at bedtime. 3.5 g 0   folic acid (FOLVITE) 1 MG tablet Take 1 tablet (1 mg total) by mouth daily. 90 tablet 1   furosemide (LASIX) 20 MG tablet Take 20 mg by mouth daily.     gabapentin (NEURONTIN) 300 MG capsule Take 1 capsule (300 mg total) by mouth at  bedtime. 90 capsule 1   glipiZIDE (GLUCOTROL) 5 MG tablet TAKE 1 TABLET BY MOUTH TWICE DAILY WITH A MEAL (Patient taking differently: Take 5 mg by mouth 2 (two) times daily before a meal.) 180 tablet 0   latanoprost (XALATAN) 0.005 % ophthalmic solution Place 1 drop into both eyes at bedtime. 2.5 mL 3   levETIRAcetam (KEPPRA) 1000 MG tablet Take 1 tablet (1,000 mg total) by mouth 2 (two) times daily. 180 tablet 1   lidocaine-prilocaine (EMLA) cream Apply 1 application topically as needed. (Patient taking differently: Apply 1 application topically as needed (for port before tx).) 30 g 0   lisinopril (ZESTRIL) 40 MG tablet Take 1 tablet (40 mg total) by mouth daily. 90 tablet 3   metFORMIN (GLUCOPHAGE) 500 MG tablet TAKE 1 TABLET BY MOUTH TWICE DAILY WITH MEALS (Patient taking differently: Take 500 mg by  mouth daily with breakfast.) 180 tablet 0   Multiple Vitamins-Minerals (MULTIVITAMIN MEN) TABS Take 1 tablet by mouth daily.     ondansetron (ZOFRAN) 8 MG tablet Take 1 tablet (8 mg total) by mouth every 8 (eight) hours as needed for nausea or vomiting. 30 tablet 0   potassium chloride SA (KLOR-CON) 20 MEQ tablet Take 20 mEq by mouth daily.     prochlorperazine (COMPAZINE) 10 MG tablet Take 1 tablet (10 mg total) by mouth every 6 (six) hours as needed for nausea or vomiting. 30 tablet 0   rosuvastatin (CRESTOR) 20 MG tablet Take by mouth.     sitaGLIPtin (JANUVIA) 100 MG tablet Take 1 tablet (100 mg total) by mouth daily. 90 tablet 3   No current facility-administered medications for this visit.    REVIEW OF SYSTEMS:   Constitutional: ( - ) fevers, ( - )  chills , ( - ) night sweats Eyes: ( - ) blurriness of vision, ( - ) double vision, ( - ) watery eyes Ears, nose, mouth, throat, and face: ( - ) mucositis, ( - ) sore throat Respiratory: ( - ) cough, ( - ) dyspnea, ( - ) wheezes Cardiovascular: ( - ) palpitation, ( - ) chest discomfort, ( - ) lower extremity swelling Gastrointestinal:  ( - )  nausea, ( - ) heartburn, ( - ) change in bowel habits Skin: ( - ) abnormal skin rashes Lymphatics: ( - ) new lymphadenopathy, ( - ) easy bruising Neurological: ( - ) numbness, ( - ) tingling, ( - ) new weaknesses Behavioral/Psych: ( - ) mood change, ( - ) new changes  All other systems were reviewed with the patient and are negative.  PHYSICAL EXAMINATION:  Vitals:   01/25/21 1022  BP: 129/88  Pulse: 70  Resp: 17  Temp: (!) 97.1 F (36.2 C)  SpO2: 98%    Filed Weights   01/25/21 1022  Weight: 151 lb 9.6 oz (68.8 kg)     GENERAL: Well-appearing elderly African-American male, alert, no distress and comfortable SKIN: skin color, texture, turgor are normal, no rashes or significant lesions EYES: conjunctiva are pink and non-injected, sclera clear LUNGS: clear to auscultation and percussion with normal breathing effort HEART: regular rate & rhythm and no murmurs and no lower extremity edema PSYCH: alert & oriented x 3, fluent speech NEURO: no focal motor/sensory deficits  LABORATORY DATA:  I have reviewed the data as listed CBC Latest Ref Rng & Units 01/25/2021 01/16/2021 01/09/2021  WBC 4.0 - 10.5 K/uL 3.0(L) 4.0 2.9(L)  Hemoglobin 13.0 - 17.0 g/dL 10.0(L) 10.3(L) 9.3(L)  Hematocrit 39.0 - 52.0 % 31.7(L) 31.3(L) 28.0(L)  Platelets 150 - 400 K/uL 392 271.0 122(L)    CMP Latest Ref Rng & Units 01/25/2021 01/16/2021 01/09/2021  Glucose 70 - 99 mg/dL 196(H) 98 89  BUN 8 - 23 mg/dL $Remove'8 7 9  'ZIypgJt$ Creatinine 0.61 - 1.24 mg/dL 0.78 0.69 0.58(L)  Sodium 135 - 145 mmol/L 144 142 139  Potassium 3.5 - 5.1 mmol/L 3.8 3.4(L) 3.6  Chloride 98 - 111 mmol/L 108 103 109  CO2 22 - 32 mmol/L $RemoveB'26 30 22  'xaTkCLqJ$ Calcium 8.9 - 10.3 mg/dL 9.4 9.5 8.3(L)  Total Protein 6.5 - 8.1 g/dL 6.4(L) 6.8 -  Total Bilirubin 0.3 - 1.2 mg/dL 0.4 0.4 -  Alkaline Phos 38 - 126 U/L 69 62 -  AST 15 - 41 U/L 15 21 -  ALT 0 - 44 U/L 13 26 -    RADIOGRAPHIC  STUDIES:  DG Chest Port 1 View  Result Date: 01/07/2021 CLINICAL DATA:   Weakness, concern for sepsis, lung cancer EXAM: PORTABLE CHEST 1 VIEW COMPARISON:  10/15/2020, 10/16/2020 FINDINGS: Right IJ power port catheter tip mid SVC level. Stable right upper lobe perihilar masslike opacity with radiopaque markers. No new superimposed airspace process, collapse or consolidation. No large effusion or pneumothorax. Trachea midline. Normal heart size and vascularity. IMPRESSION: No superimposed acute chest process. Stable right upper lobe perihilar masslike opacity Electronically Signed   By: Jerilynn Mages.  Shick M.D.   On: 01/07/2021 10:58    ASSESSMENT & PLAN Benjamin Willis. 75 y.o. 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  After review the labs, reviewed records, discussion with the patient the findings are most consistent with metastatic adenocarcinoma with spread to the brain.  Unfortunately we do not have new pathology as the lesions in his brain were not resectable and he underwent radiation therapy instead.  We can presume based off his prior pathology that this represents the original adenocarcinoma.  As such I do believe it is reasonable to proceed with carboplatin, pembrolizumab, pemetrexed therapy in order to help treat any possible micrometastatic disease.  The patient also underwent whole brain radiation therapy instead of SRS.  Therefore I do believe systemic therapy will be reasonable.  This plan of care was discussed with the resident lung expert Dr. Julien Nordmann. Additionally our Wagram 360 testing was negative for a targetable mutation.   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 Lost to follow up --presents with brain metastasis only. No evidence of disease in the C/A/P noted on CT scan from 10/16/2020.  --Patient completed whole brain radiation, but did not wish  to undergo SRS.  Radiation oncology noted that this may be an option in the future if he were to have recurrence in the brain --Guardant 360 on 11/01/2020 showed no targetable mutations.  Plan: --plan to proceed with Carbo/Pem/Pem chemotherapy, Cycle 3 Day 1 today. --Plan for CT scan after Cycle 4 of chemotherapy.  --continue to follow with neuro-oncology for brain metastasis --RTC in 3 weeks for Cycle 4 of chemotherapy.   #Chemotherapy Induced Anemia #Chemotherapy Induced Leukopenia --today WBC 3.0 and Hgb 10.0. ANC 1.7. stable from prior --continue to monitor   #Supportive Care -- chemotherapy education complete  -- port placed --daily PO 1mg  folic acid with pemetrexed. -- zofran 8mg  q8H PRN and compazine 10mg  PO q6H for nausea -- EMLA cream for port -- no pain medication required at this time.   No orders of the defined types were placed in this encounter.  All questions were answered. The patient knows to call the clinic with any problems, questions or concerns.  A total of more than 30 minutes were spent on this encounter with face-to-face time and non-face-to-face time, including preparing to see the patient, ordering tests and/or medications, counseling the patient and coordination of care as outlined above.   Ledell Peoples, MD Department of Hematology/Oncology [place] at [facility] Phone: [phone] Pager: [phone] Email: [email or web address]  01/25/2021 10:51 AM

## 2021-01-25 NOTE — Patient Instructions (Signed)
[facility]   Discharge Instructions: Thank you for choosing [facility] to provide your oncology and hematology care.   If you have a lab appointment with the [facility], please go directly to the [facility] and check in at the registration area.   Wear comfortable clothing and clothing appropriate for easy access to any Portacath or PICC line.   We strive to give you quality time with your provider. You may need to reschedule your appointment if you arrive late (15 or more minutes).  Arriving late affects you and other patients whose appointments are after yours.  Also, if you miss three or more appointments without notifying the office, you may be dismissed from the clinic at the provider's discretion.      For prescription refill requests, have your pharmacy contact our office and allow 72 hours for refills to be completed.    Today you received the following chemotherapy and/or immunotherapy agents: Pembrolizumab (Keytruda), Pemtrexed (Alimta), and Carboplatin      To help prevent nausea and vomiting after your treatment, we encourage you to take your nausea medication as directed.  BELOW ARE SYMPTOMS THAT SHOULD BE REPORTED IMMEDIATELY: *FEVER GREATER THAN 100.4 F (38 C) OR HIGHER *CHILLS OR SWEATING *NAUSEA AND VOMITING THAT IS NOT CONTROLLED WITH YOUR NAUSEA MEDICATION *UNUSUAL SHORTNESS OF BREATH *UNUSUAL BRUISING OR BLEEDING *URINARY PROBLEMS (pain or burning when urinating, or frequent urination) *BOWEL PROBLEMS (unusual diarrhea, constipation, pain near the anus) TENDERNESS IN MOUTH AND THROAT WITH OR WITHOUT PRESENCE OF ULCERS (sore throat, sores in mouth, or a toothache) UNUSUAL RASH, SWELLING OR PAIN  UNUSUAL VAGINAL DISCHARGE OR ITCHING   Items with * indicate a potential emergency and should be followed up as soon as possible or go to the Emergency Department if any problems should occur.  Please show the CHEMOTHERAPY  ALERT CARD or IMMUNOTHERAPY ALERT CARD at check-in to the Emergency Department and triage nurse.  Should you have questions after your visit or need to cancel or reschedule your appointment, please contact [facility]  Dept: [phone]  and follow the prompts.  Office hours are 8:00 a.m. to 4:30 p.m. Monday - Friday. Please note that voicemails left after 4:00 p.m. may not be returned until the following business day.  We are closed weekends and major holidays. You have access to a nurse at all times for urgent questions. Please call the main number to the clinic Dept: [phone] and follow the prompts.   For any non-urgent questions, you may also contact your provider using MyChart. We now offer e-Visits for anyone 58 and older to request care online for non-urgent symptoms. For details visit [email or web address].   Also download the MyChart app! Go to the app store, search "MyChart", open the app, select , and log in with your MyChart username and password.  Due to Covid, a mask is required upon entering the hospital/clinic. If you do not have a mask, one will be given to you upon arrival. For doctor visits, patients may have 1 support person aged 56 or older with them. For treatment visits, patients cannot have anyone with them due to current Covid guidelines and our immunocompromised population.

## 2021-01-28 DIAGNOSIS — D630 Anemia in neoplastic disease: Secondary | ICD-10-CM | POA: Diagnosis not present

## 2021-01-28 DIAGNOSIS — E119 Type 2 diabetes mellitus without complications: Secondary | ICD-10-CM | POA: Diagnosis not present

## 2021-01-28 DIAGNOSIS — C7931 Secondary malignant neoplasm of brain: Secondary | ICD-10-CM | POA: Diagnosis not present

## 2021-01-28 DIAGNOSIS — C3490 Malignant neoplasm of unspecified part of unspecified bronchus or lung: Secondary | ICD-10-CM | POA: Diagnosis not present

## 2021-01-28 DIAGNOSIS — I10 Essential (primary) hypertension: Secondary | ICD-10-CM | POA: Diagnosis not present

## 2021-01-28 DIAGNOSIS — N390 Urinary tract infection, site not specified: Secondary | ICD-10-CM | POA: Diagnosis not present

## 2021-01-30 DIAGNOSIS — D630 Anemia in neoplastic disease: Secondary | ICD-10-CM | POA: Diagnosis not present

## 2021-01-30 DIAGNOSIS — I10 Essential (primary) hypertension: Secondary | ICD-10-CM | POA: Diagnosis not present

## 2021-01-30 DIAGNOSIS — C3490 Malignant neoplasm of unspecified part of unspecified bronchus or lung: Secondary | ICD-10-CM | POA: Diagnosis not present

## 2021-01-30 DIAGNOSIS — C7931 Secondary malignant neoplasm of brain: Secondary | ICD-10-CM | POA: Diagnosis not present

## 2021-01-30 DIAGNOSIS — N390 Urinary tract infection, site not specified: Secondary | ICD-10-CM | POA: Diagnosis not present

## 2021-01-30 DIAGNOSIS — E119 Type 2 diabetes mellitus without complications: Secondary | ICD-10-CM | POA: Diagnosis not present

## 2021-02-01 ENCOUNTER — Encounter: Payer: Self-pay | Admitting: Radiology

## 2021-02-02 ENCOUNTER — Ambulatory Visit (HOSPITAL_COMMUNITY)
Admission: RE | Admit: 2021-02-02 | Discharge: 2021-02-02 | Disposition: A | Discharge status: Home / Self Care | Payer: Medicare Other | Source: Ambulatory Visit | Attending: Internal Medicine | Admitting: Internal Medicine

## 2021-02-02 DIAGNOSIS — C7931 Secondary malignant neoplasm of brain: Secondary | ICD-10-CM | POA: Insufficient documentation

## 2021-02-02 DIAGNOSIS — G9389 Other specified disorders of brain: Secondary | ICD-10-CM | POA: Diagnosis not present

## 2021-02-02 DIAGNOSIS — G936 Cerebral edema: Secondary | ICD-10-CM | POA: Diagnosis not present

## 2021-02-02 IMAGING — MR MR HEAD WO/W CM
14 series · 48 of 48 positions shown · IV contrast (7ML GADAVIST)
Comparison: Brain MRI [DATE].

CLINICAL DATA: 81-year-old male with metastatic lung cancer.
Radiation treatment completed in [REDACTED] (reportedly whole brain
radiation). Restaging.

EXAM:
MRI HEAD WITHOUT AND WITH CONTRAST
TECHNIQUE: Multiplanar, multiecho pulse sequences of the brain and surrounding
structures were obtained without and with intravenous contrast.
CONTRAST:  7mL GADAVIST GADOBUTROL 1 MMOL/ML IV SOLN

[Series 5: DWI · axial · 3.0mm · 1.36mm/px · z∈[-23,+127]mm · 5 of 107 slices shown (1 of 2)]
[im 1/107]
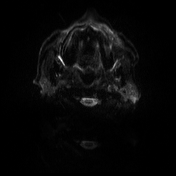
[im 27/107]
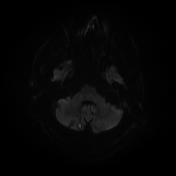
[im 54/107]
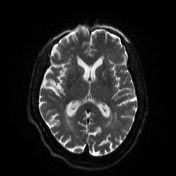
[im 80/107]
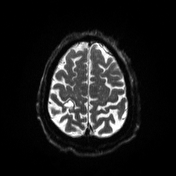
[im 107/107]
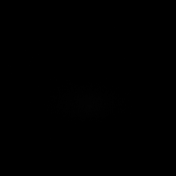

[Series 6: DWI · axial · 3.0mm · 1.36mm/px · z∈[-23,+124]mm · 3 of 53 slices shown (2 of 2)]
[im 1/53]
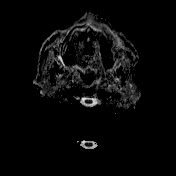
[im 27/53]
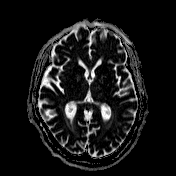
[im 53/53]
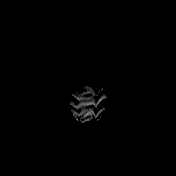

[Series 7: T1 · sagittal · 5.0mm · 0.75mm/px · 1 of 27 slices shown (1 of 2)]
[im 1/27]
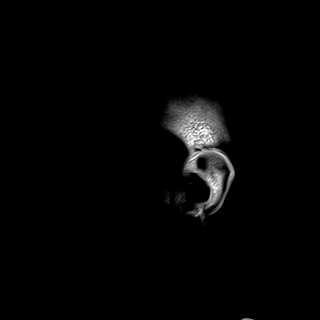

[Series 8: T2 · axial · 5.0mm · 0.65mm/px · 1 of 28 slices shown]
[im 1/28]
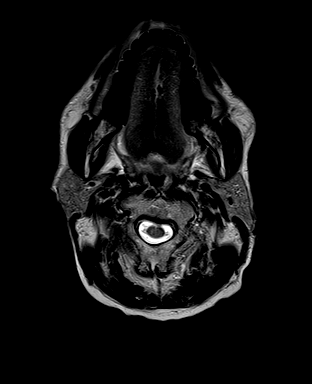

[Series 9: swi_images · axial · 3.0mm · 0.75mm/px · z∈[-63,+140]mm · 4 of 72 slices shown]
[im 1/72]
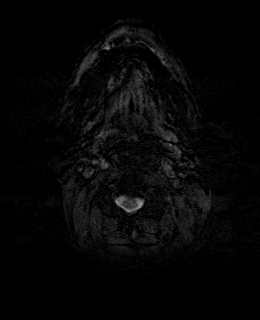
[im 24/72]
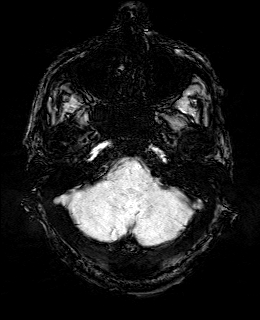
[im 48/72]
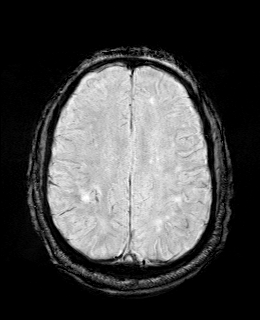
[im 72/72]
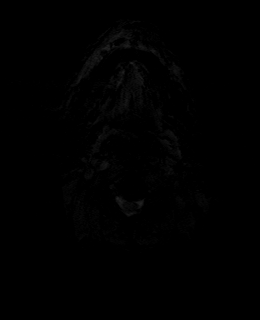

[Series 11: FLAIR · axial · 3.0mm · 0.75mm/px · z∈[-34,+111]mm · 3 of 52 slices shown (1 of 2)]
[im 1/52]
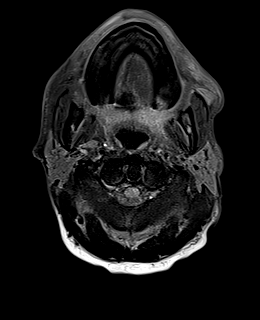
[im 26/52]
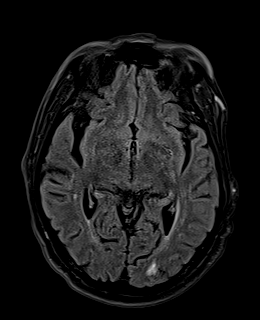
[im 52/52]
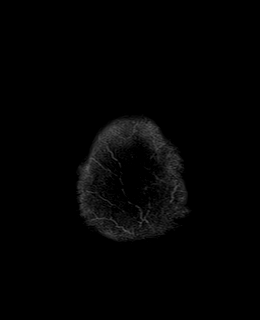

[Series 12: T1 · axial · 1.0mm · 0.98mm/px · z∈[-40,+126]mm · 9 of 176 slices shown (2 of 2)]
[im 1/176]
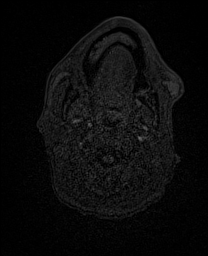
[im 22/176]
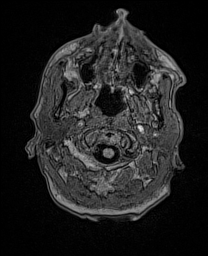
[im 44/176]
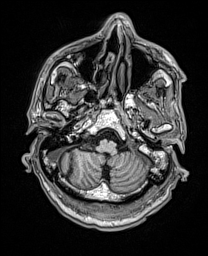
[im 66/176]
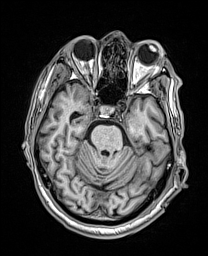
[im 88/176]
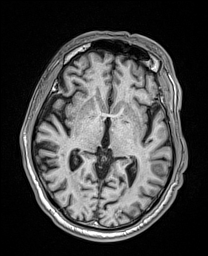
[im 110/176]
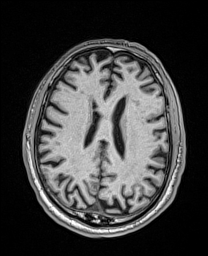
[im 132/176]
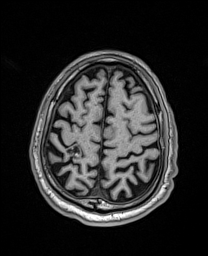
[im 154/176]
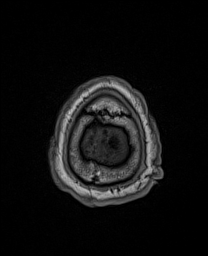
[im 176/176]
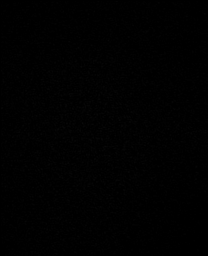

[Series 13: FLAIR · axial · 3.0mm · 0.94mm/px · z∈[-34,+111]mm · 3 of 52 slices shown (2 of 2)]
[im 1/52]
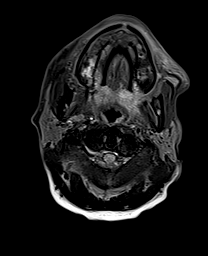
[im 26/52]
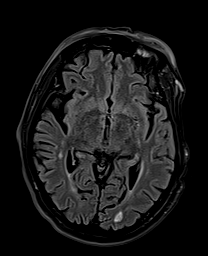
[im 52/52]
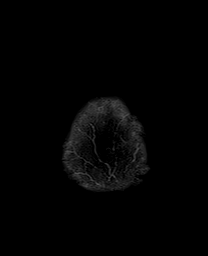

[Series 14: cor dwi_tracew · coronal · 5.0mm · 1.53mm/px · 3 of 62 slices shown]
[im 1/62]
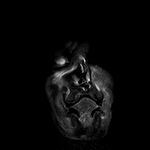
[im 31/62]
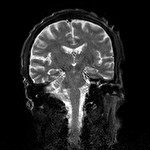
[im 62/62]
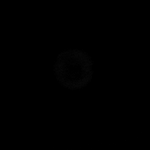

[Series 15: cor dwi_adc · coronal · 5.0mm · 1.53mm/px · 2 of 31 slices shown]
[im 1/31]
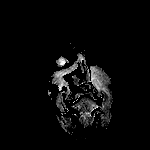
[im 31/31]
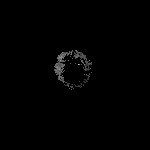

[Series 16: T2 post-contrast · coronal · 5.0mm · 0.57mm/px · 2 of 32 slices shown]
[im 1/32]
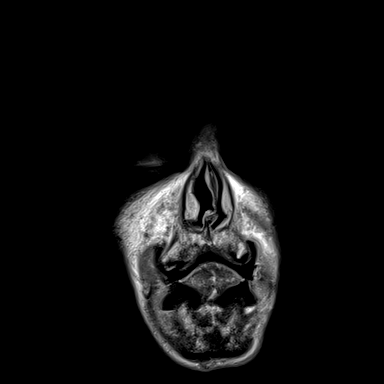
[im 32/32]
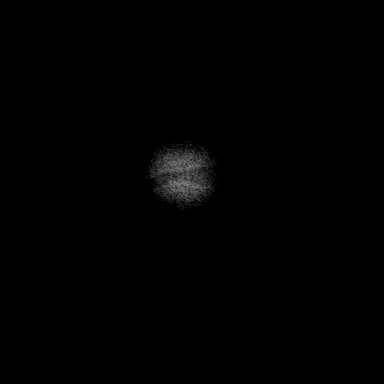

[Series 17: T1 post-contrast · axial · 1.0mm · 0.98mm/px · z∈[-40,+126]mm · 9 of 176 slices shown (1 of 3)]
[im 1/176]
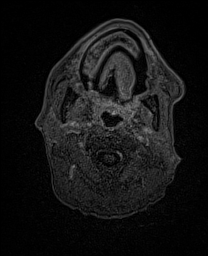
[im 22/176]
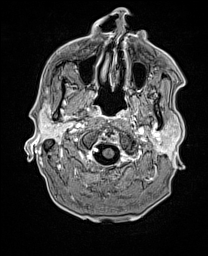
[im 44/176]
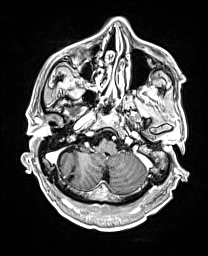
[im 66/176]
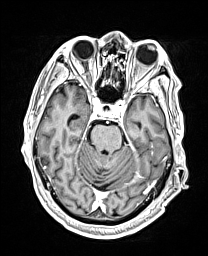
[im 88/176]
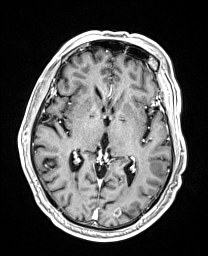
[im 110/176]
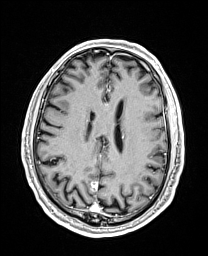
[im 132/176]
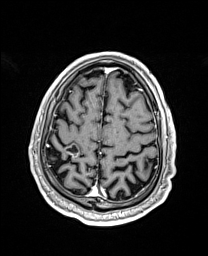
[im 154/176]
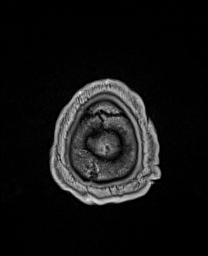
[im 176/176]
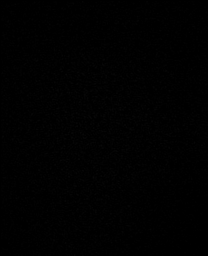

[Series 18: T1 post-contrast · coronal · 5.0mm · 0.43mm/px · 2 of 32 slices shown (2 of 3)]
[im 1/32]
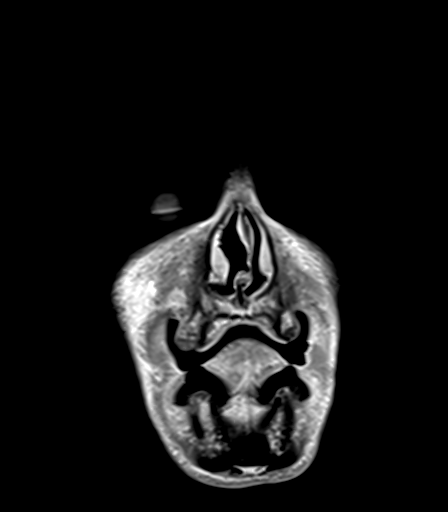
[im 32/32]
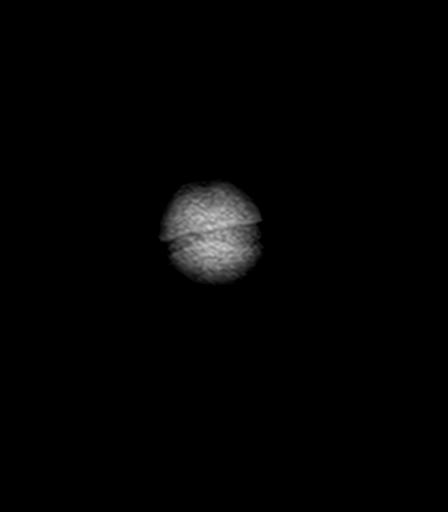

[Series 19: T1 post-contrast · sagittal · 5.0mm · 0.75mm/px · 1 of 27 slices shown (3 of 3)]
[im 1/27]
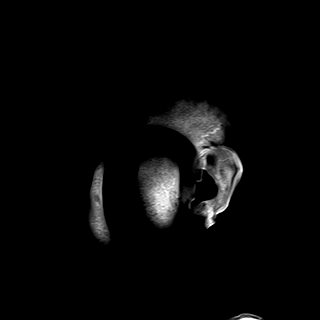

[48 of 48 positions shown; findings below may reference images not displayed]

FINDINGS: BRAIN

New Lesions: None.

Larger lesions: None.

Stable or Smaller lesions:

12 mm enhancing lesion located in the medial right cerebellum and
seen on series 17, image 53 (previously 22 mm).

11 mm lesion in the left occipital lobe posteriorly (series 17,
image 87), previously 26 mm).

10 mm lesion in the medial right inferior parietal lobe near the
parieto-occipital sulcus on image 108, previously 24 mm).

Stellate roughly 15 mm diameter lesion along the right central
sulcus (image 128), previously 33 mm).

Vasogenic edema in each of these areas has largely resolved. Mild
associated hemosiderin.

Other Brain findings: No dural thickening identified. No restricted
diffusion suggestive of acute infarction. No midline shift,
ventriculomegaly, extra-axial collection or acute intracranial
hemorrhage. Scattered white matter T2 and FLAIR hyperintensity is
stable, moderate for age. Mild T2 heterogeneity in the pons is
stable. Cervicomedullary junction and pituitary are within normal
limits.

Vascular: Major intracranial vascular flow voids are stable.
Dominant right vertebral artery.

Skull and upper cervical spine: Negative. Visible bone marrow signal
is normal.

Sinuses/Orbits: Negative orbits. Trace paranasal sinus mucosal
thickening.

Other: Mild bilateral mastoid effusions are new and likely the
sequelae of radiation, greater on the left. Negative visible
nasopharynx. Other visible internal auditory structures appear
grossly normal. Negative visible scalp and face.
IMPRESSION: Regression of all four brain metastases, with no new or increased
metastasis identified.

## 2021-02-02 MED ORDER — GADOBUTROL 1 MMOL/ML IV SOLN
7.0000 mL | Freq: Once | INTRAVENOUS | Status: AC | PRN
  Administered 2021-02-02: 7 mL via INTRAVENOUS

## 2021-02-04 DIAGNOSIS — C3490 Malignant neoplasm of unspecified part of unspecified bronchus or lung: Secondary | ICD-10-CM | POA: Diagnosis not present

## 2021-02-04 DIAGNOSIS — N390 Urinary tract infection, site not specified: Secondary | ICD-10-CM | POA: Diagnosis not present

## 2021-02-04 DIAGNOSIS — I10 Essential (primary) hypertension: Secondary | ICD-10-CM | POA: Diagnosis not present

## 2021-02-04 DIAGNOSIS — C7931 Secondary malignant neoplasm of brain: Secondary | ICD-10-CM | POA: Diagnosis not present

## 2021-02-04 DIAGNOSIS — E119 Type 2 diabetes mellitus without complications: Secondary | ICD-10-CM | POA: Diagnosis not present

## 2021-02-04 DIAGNOSIS — D630 Anemia in neoplastic disease: Secondary | ICD-10-CM | POA: Diagnosis not present

## 2021-02-05 ENCOUNTER — Ambulatory Visit (INDEPENDENT_AMBULATORY_CARE_PROVIDER_SITE_OTHER): Payer: Medicare Other | Admitting: Podiatry

## 2021-02-05 ENCOUNTER — Encounter: Payer: Self-pay | Admitting: Podiatry

## 2021-02-05 ENCOUNTER — Ambulatory Visit
Admission: RE | Admit: 2021-02-05 | Discharge: 2021-02-05 | Disposition: A | Discharge status: Home / Self Care | Payer: Medicare Other | Source: Ambulatory Visit | Attending: Radiation Oncology | Admitting: Radiation Oncology

## 2021-02-05 ENCOUNTER — Other Ambulatory Visit: Payer: Self-pay

## 2021-02-05 ENCOUNTER — Inpatient Hospital Stay: Payer: Medicare Other | Attending: Hematology and Oncology | Admitting: Internal Medicine

## 2021-02-05 VITALS — BP 125/77 | HR 95 | Temp 97.7°F | Resp 17 | Ht 66.0 in | Wt 149.4 lb

## 2021-02-05 DIAGNOSIS — E119 Type 2 diabetes mellitus without complications: Secondary | ICD-10-CM | POA: Diagnosis not present

## 2021-02-05 DIAGNOSIS — Z79899 Other long term (current) drug therapy: Secondary | ICD-10-CM | POA: Insufficient documentation

## 2021-02-05 DIAGNOSIS — B351 Tinea unguium: Secondary | ICD-10-CM

## 2021-02-05 DIAGNOSIS — D6481 Anemia due to antineoplastic chemotherapy: Secondary | ICD-10-CM | POA: Diagnosis not present

## 2021-02-05 DIAGNOSIS — Z5112 Encounter for antineoplastic immunotherapy: Secondary | ICD-10-CM | POA: Diagnosis not present

## 2021-02-05 DIAGNOSIS — Z5111 Encounter for antineoplastic chemotherapy: Secondary | ICD-10-CM | POA: Diagnosis not present

## 2021-02-05 DIAGNOSIS — M79609 Pain in unspecified limb: Secondary | ICD-10-CM

## 2021-02-05 DIAGNOSIS — C7931 Secondary malignant neoplasm of brain: Secondary | ICD-10-CM | POA: Insufficient documentation

## 2021-02-05 DIAGNOSIS — C3491 Malignant neoplasm of unspecified part of right bronchus or lung: Secondary | ICD-10-CM | POA: Diagnosis not present

## 2021-02-05 DIAGNOSIS — D701 Agranulocytosis secondary to cancer chemotherapy: Secondary | ICD-10-CM | POA: Insufficient documentation

## 2021-02-05 NOTE — Progress Notes (Signed)
[place] [facility] [place], [place] [address] [phone]   Interval Evaluation  Date of Service: 02/05/21 Patient Name: Benjamin Willis. Patient MRN: [id number] Patient DOB: [birth year] Provider: Ventura Sellers, MD  Identifying Statement:  Benjamin Leiner. is a 75 y.o. male with Brain metastases [facility])   Primary Cancer:  Oncologic History: Oncology History  Primary cancer of right upper lobe of lung ([place])  02/07/2019 Initial Diagnosis   Primary cancer of right upper lobe of lung ([place])   11/08/2020 Cancer Staging   Staging form: Lung, AJCC 8th Edition - Clinical: Stage IV (cT0, cN0, cM1) - Signed by Orson Slick, MD on 11/08/2020   Metastatic lung cancer (metastasis from lung to other site) [facility])  10/16/2020 Initial Diagnosis   Metastatic lung cancer (metastasis from lung to other site) [facility])   11/01/2020 Cancer Staging   Staging form: Lung, AJCC 8th Edition - Clinical stage from 11/01/2020: Stage IVB (cT0, cN0, cM1c) - Signed by Orson Slick, MD on 11/01/2020 Stage prefix: Initial diagnosis   12/14/2020 -  Chemotherapy    Patient is on Treatment Plan: LUNG CARBOPLATIN / PEMETREXED / PEMBROLIZUMAB Q21D INDUCTION X 4 CYCLES / MAINTENANCE PEMETREXED + PEMBROLIZUMAB         Interval History: Benjamin Life. presents today for follow up after recent brain MRI.  He describes no new or progressive neurologic deficits.  Denies seizures or headaches.  Continues on [place] and alimta with Dr. Lorenso Courier.     H+P (11/06/20) Benjamin Florina Ou. presented to neurologic attention on June 13th, 2022 with complaints of left sided weakness.  He describes new difficulty with walking and using his left hand for daily tasks.  He did not have any falls, and remained independent (though Onset of symptoms was 1-2 weeks prior.  His lung cancer, previously early stage, had been treated with radiation in both 2020 and for recurrence in 2021.  He is  scheduled to consult with Dr. Lorenso Courier for further management of systemic disease.    Medications: Current Outpatient Medications on File Prior to Visit  Medication Sig Dispense Refill   acetaminophen (TYLENOL) 500 MG tablet Take 1,000 mg by mouth every 6 (six) hours as needed for mild pain.     albuterol (VENTOLIN HFA) 108 (90 Base) MCG/ACT inhaler Inhale 2 puffs into the lungs every 6 (six) hours as needed for wheezing or shortness of breath. 18 g 6   amLODipine (NORVASC) 10 MG tablet Take 1 tablet (10 mg total) by mouth daily. 90 tablet 3   ammonium lactate (AMLACTIN) 12 % lotion Apply 1 application topically as needed for dry skin. 400 g 0   blood glucose meter kit and supplies KIT Dispense based on patient and insurance preference. Use up to four times daily as directed. 1 each 0   Blood Pressure Monitoring (BLOOD PRESSURE MONITOR/ARM) DEVI Check BP once a day. DX I 10 1 each 0   cetirizine (ZYRTEC) 10 MG tablet Take 1 tablet (10 mg total) by mouth daily. (Patient taking differently: Take 10 mg by mouth daily as needed for allergies.) 30 tablet 11   erythromycin ophthalmic ointment Place 1 application into the left eye at bedtime. 3.5 g 0   folic acid (FOLVITE) 1 MG tablet Take 1 tablet (1 mg total) by mouth daily. 90 tablet 1   furosemide (LASIX) 20 MG tablet Take 20 mg by mouth daily.     gabapentin (NEURONTIN)  300 MG capsule Take 1 capsule (300 mg total) by mouth at bedtime. 90 capsule 1   glipiZIDE (GLUCOTROL) 5 MG tablet TAKE 1 TABLET BY MOUTH TWICE DAILY WITH A MEAL (Patient taking differently: Take 5 mg by mouth 2 (two) times daily before a meal.) 180 tablet 0   latanoprost (XALATAN) 0.005 % ophthalmic solution Place 1 drop into both eyes at bedtime. 2.5 mL 3   levETIRAcetam (KEPPRA) 1000 MG tablet Take 1 tablet (1,000 mg total) by mouth 2 (two) times daily. 180 tablet 1   lidocaine-prilocaine (EMLA) cream Apply 1 application topically as needed. (Patient taking differently: Apply 1  application topically as needed (for port before tx).) 30 g 0   lisinopril (ZESTRIL) 40 MG tablet Take 1 tablet (40 mg total) by mouth daily. 90 tablet 3   metFORMIN (GLUCOPHAGE) 500 MG tablet TAKE 1 TABLET BY MOUTH TWICE DAILY WITH MEALS (Patient taking differently: Take 500 mg by mouth daily with breakfast.) 180 tablet 0   Multiple Vitamins-Minerals (MULTIVITAMIN MEN) TABS Take 1 tablet by mouth daily.     ondansetron (ZOFRAN) 8 MG tablet Take 1 tablet (8 mg total) by mouth every 8 (eight) hours as needed for nausea or vomiting. 30 tablet 0   potassium chloride SA (KLOR-CON) 20 MEQ tablet Take 20 mEq by mouth daily.     prochlorperazine (COMPAZINE) 10 MG tablet Take 1 tablet (10 mg total) by mouth every 6 (six) hours as needed for nausea or vomiting. 30 tablet 0   rosuvastatin (CRESTOR) 20 MG tablet Take by mouth.     sitaGLIPtin (JANUVIA) 100 MG tablet Take 1 tablet (100 mg total) by mouth daily. 90 tablet 3   No current facility-administered medications on file prior to visit.    Allergies: No Known Allergies Past Medical History:  Past Medical History:  Diagnosis Date   Allergy    Diabetes mellitus without complication ([place])    Type II   Glaucoma 10/16/2020   History of radiation therapy    [place]- SBRT 02/21/19-02/28/19, Rt Lung- IMRT 02/29/20-04/16/20 Dr. Gery Pray   History of radiation therapy 10/30/2020   whole brain 10/17/2020-10/30/2020 Dr Gery Pray   Hyperlipidemia    Hypertension    Lung cancer ([place])    Stage IA2 (cT1b, N0) non-small cell lung cancer (adenocarcinoma)   Past Surgical History:  Past Surgical History:  Procedure Laterality Date   ELECTROMAGNETIC NAVIGATION BROCHOSCOPY Right 02/02/2019   Procedure: VIDEO BRONCHOSCOPY WITH NAVIGATION AND FIDUCIAL PLACEMENT;  Surgeon: Garner Nash, DO;  Location: [place];  Service: Cardiopulmonary;  Laterality: Right;   FUDUCIAL PLACEMENT Right 02/02/2019   Procedure: Placement Of Fuducial Right Upper Lobe;  Surgeon:  Garner Nash, DO;  Location: [place];  Service: Cardiopulmonary;  Laterality: Right;   IR IMAGING GUIDED PORT INSERTION  12/13/2020   none     VIDEO BRONCHOSCOPY WITH RADIAL ENDOBRONCHIAL ULTRASOUND Right 02/02/2019   Procedure: Video Bronchoscopy With Radial Endobronchial Ultrasound;  Surgeon: Garner Nash, DO;  Location: [place];  Service: Cardiopulmonary;  Laterality: Right;   Social History:  Social History   Socioeconomic History   Marital status: Single    Spouse name: Not on file   Number of children: 0   Years of education: Not on file   Highest education level: 9th grade  Occupational History   Not on file  Tobacco Use   Smoking status: Former    Packs/day: 1.00    Years: 48.00    Pack years: 48.00  Types: Cigarettes    Quit date: 02/20/2015    Years since quitting: 5.9   Smokeless tobacco: Former    Quit date: 10/2013  Vaping Use   Vaping Use: Never used  Substance and Sexual Activity   Alcohol use: No   Drug use: No   Sexual activity: Yes    Birth control/protection: Condom  Other Topics Concern   Not on file  Social History Narrative   Not on file   Social Determinants of Health   Financial Resource Strain: Not on file  Food Insecurity: Not on file  Transportation Needs: Not on file  Physical Activity: Not on file  Stress: Not on file  Social Connections: Not on file  Intimate Partner Violence: Not on file   Family History:  Family History  Problem Relation Age of Onset   Lung cancer Neg Hx     Review of Systems: Constitutional: Doesn't report fevers, chills or abnormal weight loss Eyes: Doesn't report blurriness of vision Ears, nose, mouth, throat, and face: Doesn't report sore throat Respiratory: Doesn't report cough, dyspnea or wheezes Cardiovascular: Doesn't report palpitation, chest discomfort  Gastrointestinal:  Doesn't report nausea, constipation, diarrhea GU: Doesn't report incontinence Skin: Doesn't report skin  rashes Neurological: Per HPI Musculoskeletal: Doesn't report joint pain Behavioral/Psych: Doesn't report anxiety  Physical Exam: Vitals:   02/05/21 1136  BP: 125/77  Pulse: 95  Resp: 17  Temp: 97.7 F (36.5 C)  SpO2: 100%   KPS: 90. General: Alert, cooperative, pleasant, in no acute distress Head: Normal EENT: No conjunctival injection or scleral icterus.  Lungs: Resp effort normal Cardiac: Regular rate Abdomen: Non-distended abdomen Skin: No rashes cyanosis or petechiae. Extremities: No clubbing or edema  Neurologic Exam: Mental Status: Awake, alert, attentive to examiner. Oriented to self and environment. Language is fluent with intact comprehension.  Cranial Nerves: Visual acuity is grossly normal. Visual fields are full. Extra-ocular movements intact. No ptosis. Face is symmetric Motor: Tone and bulk are normal. Power is full in both arms and legs. Reflexes are symmetric, no pathologic reflexes present.  Sensory: Intact to light touch Gait: Normal.   Labs: I have reviewed the data as listed    Component Value Date/Time   NA 144 01/25/2021 0958   NA 143 12/06/2019 0949   K 3.8 01/25/2021 0958   CL 108 01/25/2021 0958   CO2 26 01/25/2021 0958   GLUCOSE 196 (H) 01/25/2021 0958   BUN 8 01/25/2021 0958   BUN 16 12/06/2019 0949   CREATININE 0.78 01/25/2021 0958   CALCIUM 9.4 01/25/2021 0958   PROT 6.4 (L) 01/25/2021 0958   PROT 6.4 12/06/2019 0949   ALBUMIN 3.6 01/25/2021 0958   ALBUMIN 4.1 12/06/2019 0949   AST 15 01/25/2021 0958   ALT 13 01/25/2021 0958   ALKPHOS 69 01/25/2021 0958   BILITOT 0.4 01/25/2021 0958   GFRNONAA >60 01/25/2021 0958   GFRAA 101 12/06/2019 0949   Lab Results  Component Value Date   WBC 3.0 (L) 01/25/2021   NEUTROABS 1.7 01/25/2021   HGB 10.0 (L) 01/25/2021   HCT 31.7 (L) 01/25/2021   MCV 98.1 01/25/2021   PLT 392 01/25/2021    Imaging:  MR BRAIN W WO CONTRAST  Result Date: 02/05/2021 CLINICAL DATA:  75 year old male with  metastatic lung cancer. Radiation treatment completed in June (reportedly whole brain radiation). Restaging. EXAM: MRI HEAD WITHOUT AND WITH CONTRAST TECHNIQUE: Multiplanar, multiecho pulse sequences of the brain and surrounding structures were obtained without and with intravenous contrast.  CONTRAST:  78mL GADAVIST GADOBUTROL 1 MMOL/ML IV SOLN COMPARISON:  Brain MRI 10/16/2020. FINDINGS: BRAIN New Lesions: None. Larger lesions: None. Stable or Smaller lesions: 12 mm enhancing lesion located in the medial right cerebellum and seen on series 17, image 53 (previously 22 mm). 11 mm lesion in the left occipital lobe posteriorly (series 17, image 87), previously 26 mm). 10 mm lesion in the medial right inferior parietal lobe near the parieto-occipital sulcus on image 108, previously 24 mm). Stellate roughly 15 mm diameter lesion along the right central sulcus (image 128), previously 33 mm). Vasogenic edema in each of these areas has largely resolved. Mild associated hemosiderin. Other Brain findings: No dural thickening identified. No restricted diffusion suggestive of acute infarction. No midline shift, ventriculomegaly, extra-axial collection or acute intracranial hemorrhage. Scattered white matter T2 and FLAIR hyperintensity is stable, moderate for age. Mild T2 heterogeneity in the pons is stable. Cervicomedullary junction and pituitary are within normal limits. Vascular: Major intracranial vascular flow voids are stable. Dominant right vertebral artery. Skull and upper cervical spine: Negative. Visible bone marrow signal is normal. Sinuses/Orbits: Negative orbits. Trace paranasal sinus mucosal thickening. Other: Mild bilateral mastoid effusions are new and likely the sequelae of radiation, greater on the left. Negative visible nasopharynx. Other visible internal auditory structures appear grossly normal. Negative visible scalp and face. IMPRESSION: Regression of all four brain metastases, with no new or increased  metastasis identified. Electronically Signed   By: Genevie Ann M.D.   On: 02/05/2021 11:15   DG Chest Port 1 View  Result Date: 01/07/2021 CLINICAL DATA:  Weakness, concern for sepsis, lung cancer EXAM: PORTABLE CHEST 1 VIEW COMPARISON:  10/15/2020, 10/16/2020 FINDINGS: Right IJ power port catheter tip mid SVC level. Stable right upper lobe perihilar masslike opacity with radiopaque markers. No new superimposed airspace process, collapse or consolidation. No large effusion or pneumothorax. Trachea midline. Normal heart size and vascularity. IMPRESSION: No superimposed acute chest process. Stable right upper lobe perihilar masslike opacity Electronically Signed   By: Jerilynn Mages.  Shick M.D.   On: 01/07/2021 10:58    [place] Clinician Interpretation: I have personally reviewed the radiological images as listed.  My interpretation, in the context of the patient's clinical presentation, is stable disease   Assessment/Plan Brain metastases ([place])  Earl Florina Ou. is clinically and radiograhpically stable today, now 3 months removed from whole brain radiation.  We ask that [place]. return to clinic in 3 months following next brain MRI, or sooner as needed.  We appreciate the opportunity to participate in the care of Benjamin Ridge.  All questions were answered. The patient knows to call the clinic with any problems, questions or concerns. No barriers to learning were detected.  The total time spent in the encounter was 30 minutes and more than 50% was on counseling and review of test results   Ventura Sellers, MD Medical Director of Neuro-Oncology [facility] at [place] 02/05/21 11:19 AM

## 2021-02-05 NOTE — Progress Notes (Addendum)
This patient returns to my office for at risk foot care.  This patient requires this care by a professional since this patient will be at risk due to having diabetic neuropathy.  This patient is unable to cut nails himself since the patient cannot reach his nails.These nails are painful walking and wearing shoes.  This patient presents for at risk foot care today.  General Appearance  Alert, conversant and in no acute stress.  Vascular  Dorsalis pedis and posterior tibial  pulses are palpable  bilaterally.  Capillary return is within normal limits  bilaterally. Temperature is within normal limits  bilaterally.  Neurologic  Senn-Weinstein monofilament wire test within normal limits /diminished  bilaterally. Muscle power within normal limits bilaterally.  Nails Thick disfigured discolored nails with subungual debris  from hallux to fifth toes bilaterally. No evidence of bacterial infection or drainage bilaterally.  Orthopedic  No limitations of motion  feet .  No crepitus or effusions noted.  No bony pathology or digital deformities noted.  HAV  B/L.  Skin  normotropic skin with no porokeratosis noted bilaterally.  No signs of infections or ulcers noted.     Onychomycosis  Pain in right toes  Pain in left toes  Consent was obtained for treatment procedures.   Mechanical debridement of nails 1-5  bilaterally performed with a nail nipper.  Filed with dremel without incident.  Patient qualifies for diabetic shoes due to DPN and HAV  B/L. To make an appointment with pedorthist lab.   Return office visit   3 months                  Told patient to return for periodic foot care and evaluation due to potential at risk complications.   Gardiner Barefoot DPM

## 2021-02-06 ENCOUNTER — Telehealth: Payer: Self-pay | Admitting: Internal Medicine

## 2021-02-06 NOTE — Telephone Encounter (Signed)
Scheduled appt per 10/4 los. Pt's sister, [name], is aware.

## 2021-02-07 NOTE — Telephone Encounter (Signed)
Error

## 2021-02-08 ENCOUNTER — Telehealth: Payer: Self-pay | Admitting: Hematology and Oncology

## 2021-02-08 DIAGNOSIS — C3490 Malignant neoplasm of unspecified part of unspecified bronchus or lung: Secondary | ICD-10-CM | POA: Diagnosis not present

## 2021-02-08 DIAGNOSIS — E119 Type 2 diabetes mellitus without complications: Secondary | ICD-10-CM | POA: Diagnosis not present

## 2021-02-08 DIAGNOSIS — N390 Urinary tract infection, site not specified: Secondary | ICD-10-CM | POA: Diagnosis not present

## 2021-02-08 DIAGNOSIS — D630 Anemia in neoplastic disease: Secondary | ICD-10-CM | POA: Diagnosis not present

## 2021-02-08 DIAGNOSIS — I10 Essential (primary) hypertension: Secondary | ICD-10-CM | POA: Diagnosis not present

## 2021-02-08 DIAGNOSIS — C7931 Secondary malignant neoplasm of brain: Secondary | ICD-10-CM | POA: Diagnosis not present

## 2021-02-08 NOTE — Telephone Encounter (Signed)
Called patient regarding upcoming appointments, patient is notified. Calender will be printed when patient comes in for next appointment.

## 2021-02-10 DIAGNOSIS — H409 Unspecified glaucoma: Secondary | ICD-10-CM | POA: Diagnosis not present

## 2021-02-10 DIAGNOSIS — I10 Essential (primary) hypertension: Secondary | ICD-10-CM | POA: Diagnosis not present

## 2021-02-10 DIAGNOSIS — Z452 Encounter for adjustment and management of vascular access device: Secondary | ICD-10-CM | POA: Diagnosis not present

## 2021-02-10 DIAGNOSIS — Z87891 Personal history of nicotine dependence: Secondary | ICD-10-CM | POA: Diagnosis not present

## 2021-02-10 DIAGNOSIS — E119 Type 2 diabetes mellitus without complications: Secondary | ICD-10-CM | POA: Diagnosis not present

## 2021-02-10 DIAGNOSIS — C7931 Secondary malignant neoplasm of brain: Secondary | ICD-10-CM | POA: Diagnosis not present

## 2021-02-10 DIAGNOSIS — G40909 Epilepsy, unspecified, not intractable, without status epilepticus: Secondary | ICD-10-CM | POA: Diagnosis not present

## 2021-02-10 DIAGNOSIS — E785 Hyperlipidemia, unspecified: Secondary | ICD-10-CM | POA: Diagnosis not present

## 2021-02-10 DIAGNOSIS — C3490 Malignant neoplasm of unspecified part of unspecified bronchus or lung: Secondary | ICD-10-CM | POA: Diagnosis not present

## 2021-02-10 DIAGNOSIS — D630 Anemia in neoplastic disease: Secondary | ICD-10-CM | POA: Diagnosis not present

## 2021-02-10 DIAGNOSIS — K219 Gastro-esophageal reflux disease without esophagitis: Secondary | ICD-10-CM | POA: Diagnosis not present

## 2021-02-10 DIAGNOSIS — Z9181 History of falling: Secondary | ICD-10-CM | POA: Diagnosis not present

## 2021-02-10 DIAGNOSIS — Z7984 Long term (current) use of oral hypoglycemic drugs: Secondary | ICD-10-CM | POA: Diagnosis not present

## 2021-02-10 DIAGNOSIS — N390 Urinary tract infection, site not specified: Secondary | ICD-10-CM | POA: Diagnosis not present

## 2021-02-11 DIAGNOSIS — C3490 Malignant neoplasm of unspecified part of unspecified bronchus or lung: Secondary | ICD-10-CM | POA: Diagnosis not present

## 2021-02-11 DIAGNOSIS — D630 Anemia in neoplastic disease: Secondary | ICD-10-CM | POA: Diagnosis not present

## 2021-02-11 DIAGNOSIS — N390 Urinary tract infection, site not specified: Secondary | ICD-10-CM | POA: Diagnosis not present

## 2021-02-11 DIAGNOSIS — E119 Type 2 diabetes mellitus without complications: Secondary | ICD-10-CM | POA: Diagnosis not present

## 2021-02-11 DIAGNOSIS — C7931 Secondary malignant neoplasm of brain: Secondary | ICD-10-CM | POA: Diagnosis not present

## 2021-02-11 DIAGNOSIS — I10 Essential (primary) hypertension: Secondary | ICD-10-CM | POA: Diagnosis not present

## 2021-02-13 ENCOUNTER — Ambulatory Visit (INDEPENDENT_AMBULATORY_CARE_PROVIDER_SITE_OTHER): Payer: Medicare Other | Admitting: *Deleted

## 2021-02-13 ENCOUNTER — Other Ambulatory Visit: Payer: Self-pay

## 2021-02-13 DIAGNOSIS — N390 Urinary tract infection, site not specified: Secondary | ICD-10-CM | POA: Diagnosis not present

## 2021-02-13 DIAGNOSIS — C3490 Malignant neoplasm of unspecified part of unspecified bronchus or lung: Secondary | ICD-10-CM | POA: Diagnosis not present

## 2021-02-13 DIAGNOSIS — I10 Essential (primary) hypertension: Secondary | ICD-10-CM | POA: Diagnosis not present

## 2021-02-13 DIAGNOSIS — D630 Anemia in neoplastic disease: Secondary | ICD-10-CM | POA: Diagnosis not present

## 2021-02-13 DIAGNOSIS — E119 Type 2 diabetes mellitus without complications: Secondary | ICD-10-CM

## 2021-02-13 DIAGNOSIS — C7931 Secondary malignant neoplasm of brain: Secondary | ICD-10-CM | POA: Diagnosis not present

## 2021-02-13 DIAGNOSIS — M2012 Hallux valgus (acquired), left foot: Secondary | ICD-10-CM

## 2021-02-13 NOTE — Progress Notes (Signed)
Patient presents to the office today for diabetic shoe and insole measuring.  Patient was measured with brannock device to determine size and width for 1 pair of extra depth shoes and foam casted for 3 pair of insoles.   Documentation of medical necessity will be sent to patient's treating diabetic doctor to verify and sign.   Patient's diabetic provider: Dr. Ines Bloomer [place]  Shoes and insoles will be ordered at that time and patient will be notified for an appointment for fitting when they arrive.   Shoe size (per patient): 9-11   Brannock measurement: RIGHT - 9.5 D, LEFT - 9 E  Patient shoe selection-   1st choice:   Apex V950M  2nd choice:  Apex X920M  Shoe size ordered: Men's 9.5 X-Wide

## 2021-02-14 DIAGNOSIS — E119 Type 2 diabetes mellitus without complications: Secondary | ICD-10-CM | POA: Diagnosis not present

## 2021-02-14 DIAGNOSIS — D630 Anemia in neoplastic disease: Secondary | ICD-10-CM | POA: Diagnosis not present

## 2021-02-14 DIAGNOSIS — N390 Urinary tract infection, site not specified: Secondary | ICD-10-CM | POA: Diagnosis not present

## 2021-02-14 DIAGNOSIS — C3490 Malignant neoplasm of unspecified part of unspecified bronchus or lung: Secondary | ICD-10-CM | POA: Diagnosis not present

## 2021-02-14 DIAGNOSIS — I10 Essential (primary) hypertension: Secondary | ICD-10-CM | POA: Diagnosis not present

## 2021-02-14 DIAGNOSIS — C7931 Secondary malignant neoplasm of brain: Secondary | ICD-10-CM | POA: Diagnosis not present

## 2021-02-15 ENCOUNTER — Inpatient Hospital Stay (HOSPITAL_BASED_OUTPATIENT_CLINIC_OR_DEPARTMENT_OTHER): Payer: Medicare Other | Admitting: Hematology and Oncology

## 2021-02-15 ENCOUNTER — Other Ambulatory Visit: Payer: Self-pay

## 2021-02-15 ENCOUNTER — Inpatient Hospital Stay: Payer: Medicare Other

## 2021-02-15 VITALS — BP 146/76 | HR 78 | Temp 97.2°F | Resp 18 | Ht 66.0 in | Wt 151.0 lb

## 2021-02-15 DIAGNOSIS — E032 Hypothyroidism due to medicaments and other exogenous substances: Secondary | ICD-10-CM

## 2021-02-15 DIAGNOSIS — C3490 Malignant neoplasm of unspecified part of unspecified bronchus or lung: Secondary | ICD-10-CM | POA: Diagnosis not present

## 2021-02-15 DIAGNOSIS — C3491 Malignant neoplasm of unspecified part of right bronchus or lung: Secondary | ICD-10-CM | POA: Diagnosis not present

## 2021-02-15 DIAGNOSIS — Z95828 Presence of other vascular implants and grafts: Secondary | ICD-10-CM

## 2021-02-15 DIAGNOSIS — D6481 Anemia due to antineoplastic chemotherapy: Secondary | ICD-10-CM | POA: Diagnosis not present

## 2021-02-15 DIAGNOSIS — Z5112 Encounter for antineoplastic immunotherapy: Secondary | ICD-10-CM | POA: Diagnosis not present

## 2021-02-15 DIAGNOSIS — C7931 Secondary malignant neoplasm of brain: Secondary | ICD-10-CM | POA: Diagnosis not present

## 2021-02-15 DIAGNOSIS — Z5111 Encounter for antineoplastic chemotherapy: Secondary | ICD-10-CM | POA: Diagnosis not present

## 2021-02-15 DIAGNOSIS — D701 Agranulocytosis secondary to cancer chemotherapy: Secondary | ICD-10-CM | POA: Diagnosis not present

## 2021-02-15 LAB — CBC WITH DIFFERENTIAL (CANCER CENTER ONLY)
Abs Immature Granulocytes: 0.01 10*3/uL (ref 0.00–0.07)
Basophils Absolute: 0 10*3/uL (ref 0.0–0.1)
Basophils Relative: 0 %
Eosinophils Absolute: 0 10*3/uL (ref 0.0–0.5)
Eosinophils Relative: 1 %
HCT: 29.9 % — ABNORMAL LOW (ref 39.0–52.0)
Hemoglobin: 9.6 g/dL — ABNORMAL LOW (ref 13.0–17.0)
Immature Granulocytes: 0 %
Lymphocytes Relative: 19 %
Lymphs Abs: 0.6 10*3/uL — ABNORMAL LOW (ref 0.7–4.0)
MCH: 31.6 pg (ref 26.0–34.0)
MCHC: 32.1 g/dL (ref 30.0–36.0)
MCV: 98.4 fL (ref 80.0–100.0)
Monocytes Absolute: 0.5 10*3/uL (ref 0.1–1.0)
Monocytes Relative: 15 %
Neutro Abs: 2 10*3/uL (ref 1.7–7.7)
Neutrophils Relative %: 65 %
Platelet Count: 252 10*3/uL (ref 150–400)
RBC: 3.04 MIL/uL — ABNORMAL LOW (ref 4.22–5.81)
RDW: 14.2 % (ref 11.5–15.5)
WBC Count: 3.1 10*3/uL — ABNORMAL LOW (ref 4.0–10.5)
nRBC: 0 % (ref 0.0–0.2)

## 2021-02-15 LAB — CMP (CANCER CENTER ONLY)
ALT: 13 U/L (ref 0–44)
AST: 21 U/L (ref 15–41)
Albumin: 3.8 g/dL (ref 3.5–5.0)
Alkaline Phosphatase: 63 U/L (ref 38–126)
Anion gap: 7 (ref 5–15)
BUN: 10 mg/dL (ref 8–23)
CO2: 27 mmol/L (ref 22–32)
Calcium: 8.8 mg/dL — ABNORMAL LOW (ref 8.9–10.3)
Chloride: 104 mmol/L (ref 98–111)
Creatinine: 0.63 mg/dL (ref 0.61–1.24)
GFR, Estimated: >60 mL/min (ref >60)
Glucose, Bld: 206 mg/dL — ABNORMAL HIGH (ref 70–99)
Potassium: 3.8 mmol/L (ref 3.5–5.1)
Sodium: 138 mmol/L (ref 135–145)
Total Bilirubin: 0.4 mg/dL (ref 0.3–1.2)
Total Protein: 6.7 g/dL (ref 6.5–8.1)

## 2021-02-15 LAB — TSH: TSH: 1.202 u[IU]/mL (ref 0.320–4.118)

## 2021-02-15 MED ORDER — SODIUM CHLORIDE 0.9 % IV SOLN: Freq: Once | INTRAVENOUS | Status: AC

## 2021-02-15 MED ORDER — SODIUM CHLORIDE 0.9% FLUSH
10.0000 mL | INTRAVENOUS | Status: DC | PRN
  Administered 2021-02-15: 10 mL

## 2021-02-15 MED ORDER — SODIUM CHLORIDE 0.9 % IV SOLN
10.0000 mg | Freq: Once | INTRAVENOUS | Status: AC
  Administered 2021-02-15: 10 mg via INTRAVENOUS
  Filled 2021-02-15: qty 10

## 2021-02-15 MED ORDER — HEPARIN SOD (PORK) LOCK FLUSH 100 UNIT/ML IV SOLN
500.0000 [IU] | Freq: Once | INTRAVENOUS | Status: AC | PRN
  Administered 2021-02-15: 500 [IU]

## 2021-02-15 MED ORDER — SODIUM CHLORIDE 0.9 % IV SOLN
200.0000 mg | Freq: Once | INTRAVENOUS | Status: AC
  Administered 2021-02-15: 200 mg via INTRAVENOUS
  Filled 2021-02-15: qty 8

## 2021-02-15 MED ORDER — SODIUM CHLORIDE 0.9 % IV SOLN
500.0000 mg/m2 | Freq: Once | INTRAVENOUS | Status: AC
  Administered 2021-02-15: 900 mg via INTRAVENOUS
  Filled 2021-02-15: qty 20

## 2021-02-15 MED ORDER — SODIUM CHLORIDE 0.9 % IV SOLN
460.0000 mg | Freq: Once | INTRAVENOUS | Status: AC
  Administered 2021-02-15: 460 mg via INTRAVENOUS
  Filled 2021-02-15: qty 46

## 2021-02-15 MED ORDER — SODIUM CHLORIDE 0.9% FLUSH
10.0000 mL | Freq: Once | INTRAVENOUS | Status: AC
  Administered 2021-02-15: 10 mL

## 2021-02-15 MED ORDER — PALONOSETRON HCL INJECTION 0.25 MG/5ML
0.2500 mg | Freq: Once | INTRAVENOUS | Status: AC
  Administered 2021-02-15: 0.25 mg via INTRAVENOUS
  Filled 2021-02-15: qty 5

## 2021-02-15 MED ORDER — SODIUM CHLORIDE 0.9 % IV SOLN
150.0000 mg | Freq: Once | INTRAVENOUS | Status: AC
  Administered 2021-02-15: 150 mg via INTRAVENOUS
  Filled 2021-02-15: qty 150

## 2021-02-15 NOTE — Progress Notes (Signed)
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  This was followed by an MRI of the brain with and without contrast which showed 4 brain lesions consistent with metastases, extensive vasogenic edema associated with the posterior right frontal lesion without midline shift. 6/15-6/28/2022: 10 fractions of WBRT with Rad/Onc 11/23/2020: Guardant 360 returns with no targetable mutation.  12/14/2020: Cycle 1 Day 1 of Carbo/Pem/Pem 01/04/2021: Cycle 2 Day 1 of Carbo/Pem/Pem 01/25/2021: Cycle 3 Day 1 of Carbo/Pem/Pem 02/15/2021: Cycle 4 Day 1 of Carbo/Pem/Pem  Interval History:  Benjamin Willis. 75 y.o. 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 The patient's last visit was on 01/25/2021.  In the interim since the last visit he has completed Cycle 3 of chemotherapy. He presents today for Cycle 4.   On exam today Benjamin Willis is unaccompanied.  He reports he has developed some dryness of the skin, particularly on his lower extremities as well as a swollen left eye.  He notes that it happened about last week when his eye became puffy.  He denies any itchiness or redness though does note he occasionally rubs it.  His weight has been stable and he has been eating well.  He denies any systemic symptoms.   He continues to ambulate on occasion but presents today in a wheelchair.  His energy levels have been good and he denies any vision changes or headache.  He also denies any shortness of breath or chest pain.  He currently denies any fevers, chills, sweats, nausea, vomiting or diarrhea.  Full 10 point ROS is listed below.  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 Gery Pray   History of radiation therapy 10/30/2020   whole brain 10/17/2020-10/30/2020 Dr Gery Pray   Hyperlipidemia    Hypertension    Lung cancer ([place])    Stage IA2 (cT1b, N0) non-small cell lung cancer (adenocarcinoma)    SURGICAL HISTORY: Past Surgical History:  Procedure Laterality Date   ELECTROMAGNETIC NAVIGATION BROCHOSCOPY Right 02/02/2019   Procedure: VIDEO BRONCHOSCOPY WITH NAVIGATION AND FIDUCIAL PLACEMENT;  Surgeon: Garner Nash, DO;  Location: [facility];  Service: Cardiopulmonary;  Laterality: Right;   FUDUCIAL PLACEMENT Right 02/02/2019   Procedure: Placement Of Fuducial Right Upper Lobe;  Surgeon: Garner Nash, DO;  Location: [facility];  Service: Cardiopulmonary;  Laterality: Right;   IR IMAGING GUIDED PORT INSERTION  12/13/2020   none     VIDEO BRONCHOSCOPY WITH RADIAL ENDOBRONCHIAL ULTRASOUND Right 02/02/2019   Procedure: Video Bronchoscopy With Radial Endobronchial Ultrasound;  Surgeon: Garner Nash, DO;  Location: [facility];  Service: Cardiopulmonary;  Laterality: Right;    SOCIAL HISTORY: Social History   Socioeconomic History  Marital status: Single    Spouse name: Not on file   Number of children: 0   Years of education: Not on file   Highest education level: 9th grade  Occupational History   Not on file  Tobacco Use   Smoking status: Former    Packs/day: 1.00    Years: 48.00     Pack years: 48.00    Types: Cigarettes    Quit date: 02/20/2015    Years since quitting: 5.9   Smokeless tobacco: Former    Quit date: 10/2013  Vaping Use   Vaping Use: Never used  Substance and Sexual Activity   Alcohol use: No   Drug use: No   Sexual activity: Yes    Birth control/protection: Condom  Other Topics Concern   Not on file  Social History Narrative   Not on file   Social Determinants of Health   Financial Resource Strain: Not on file  Food Insecurity: Not on file  Transportation Needs: Not on file  Physical Activity: Not on file  Stress: Not on file  Social Connections: Not on file  Intimate Partner Violence: Not on file    FAMILY HISTORY: Family History  Problem Relation Age of Onset   Lung cancer Neg Hx     ALLERGIES:  has No Known Allergies.  MEDICATIONS:  Current Outpatient Medications  Medication Sig Dispense Refill   acetaminophen (TYLENOL) 500 MG tablet Take 1,000 mg by mouth every 6 (six) hours as needed for mild pain.     albuterol (VENTOLIN HFA) 108 (90 Base) MCG/ACT inhaler Inhale 2 puffs into the lungs every 6 (six) hours as needed for wheezing or shortness of breath. 18 g 6   amLODipine (NORVASC) 10 MG tablet Take 1 tablet (10 mg total) by mouth daily. 90 tablet 3   ammonium lactate (AMLACTIN) 12 % lotion Apply 1 application topically as needed for dry skin. 400 g 0   blood glucose meter kit and supplies KIT Dispense based on patient and insurance preference. Use up to four times daily as directed. 1 each 0   Blood Pressure Monitoring (BLOOD PRESSURE MONITOR/ARM) DEVI Check BP once a day. DX I 10 1 each 0   cetirizine (ZYRTEC) 10 MG tablet Take 1 tablet (10 mg total) by mouth daily. (Patient taking differently: Take 10 mg by mouth daily as needed for allergies.) 30 tablet 11   erythromycin ophthalmic ointment Place 1 application into the left eye at bedtime. 3.5 g 0   folic acid (FOLVITE) 1 MG tablet Take 1 tablet (1 mg total) by mouth daily.  90 tablet 1   furosemide (LASIX) 20 MG tablet Take 20 mg by mouth daily.     gabapentin (NEURONTIN) 300 MG capsule Take 1 capsule (300 mg total) by mouth at bedtime. 90 capsule 1   glipiZIDE (GLUCOTROL) 5 MG tablet TAKE 1 TABLET BY MOUTH TWICE DAILY WITH A MEAL (Patient taking differently: Take 5 mg by mouth 2 (two) times daily before a meal.) 180 tablet 0   latanoprost (XALATAN) 0.005 % ophthalmic solution Place 1 drop into both eyes at bedtime. 2.5 mL 3   levETIRAcetam (KEPPRA) 1000 MG tablet Take 1 tablet (1,000 mg total) by mouth 2 (two) times daily. 180 tablet 1   lidocaine-prilocaine (EMLA) cream Apply 1 application topically as needed. (Patient taking differently: Apply 1 application topically as needed (for port before tx).) 30 g 0   lisinopril (ZESTRIL) 40 MG tablet Take 1 tablet (40 mg total) by mouth  daily. 90 tablet 3   metFORMIN (GLUCOPHAGE) 500 MG tablet TAKE 1 TABLET BY MOUTH TWICE DAILY WITH MEALS (Patient taking differently: Take 500 mg by mouth daily with breakfast.) 180 tablet 0   Multiple Vitamins-Minerals (MULTIVITAMIN MEN) TABS Take 1 tablet by mouth daily.     ondansetron (ZOFRAN) 8 MG tablet Take 1 tablet (8 mg total) by mouth every 8 (eight) hours as needed for nausea or vomiting. 30 tablet 0   potassium chloride SA (KLOR-CON) 20 MEQ tablet Take 20 mEq by mouth daily.     prochlorperazine (COMPAZINE) 10 MG tablet Take 1 tablet (10 mg total) by mouth every 6 (six) hours as needed for nausea or vomiting. 30 tablet 0   rosuvastatin (CRESTOR) 20 MG tablet Take by mouth.     sitaGLIPtin (JANUVIA) 100 MG tablet Take 1 tablet (100 mg total) by mouth daily. 90 tablet 3   No current facility-administered medications for this visit.   Facility-Administered Medications Ordered in Other Visits  Medication Dose Route Frequency Provider Last Rate Last Admin   CARBOplatin (PARAPLATIN) 460 mg in sodium chloride 0.9 % 250 mL chemo infusion  460 mg Intravenous Once Ledell Peoples IV, MD        heparin lock flush 100 unit/mL  500 Units Intracatheter Once PRN Orson Slick, MD       PEMEtrexed (ALIMTA) 900 mg in sodium chloride 0.9 % 100 mL chemo infusion  500 mg/m2 (Treatment Plan Recorded) Intravenous Once Orson Slick, MD 816 mL/hr at 02/15/21 1248 900 mg at 02/15/21 1248   sodium chloride flush (NS) 0.9 % injection 10 mL  10 mL Intracatheter PRN Orson Slick, MD        REVIEW OF SYSTEMS:   Constitutional: ( - ) fevers, ( - )  chills , ( - ) night sweats Eyes: ( - ) blurriness of vision, ( - ) double vision, ( - ) watery eyes Ears, nose, mouth, throat, and face: ( - ) mucositis, ( - ) sore throat Respiratory: ( - ) cough, ( - ) dyspnea, ( - ) wheezes Cardiovascular: ( - ) palpitation, ( - ) chest discomfort, ( - ) lower extremity swelling Gastrointestinal:  ( - ) nausea, ( - ) heartburn, ( - ) change in bowel habits Skin: ( - ) abnormal skin rashes Lymphatics: ( - ) new lymphadenopathy, ( - ) easy bruising Neurological: ( - ) numbness, ( - ) tingling, ( - ) new weaknesses Behavioral/Psych: ( - ) mood change, ( - ) new changes  All other systems were reviewed with the patient and are negative.  PHYSICAL EXAMINATION:  Vitals:   02/15/21 1035  BP: (!) 146/76  Pulse: 78  Resp: 18  Temp: (!) 97.2 F (36.2 C)  SpO2: 100%    Filed Weights   02/15/21 1035  Weight: 151 lb (68.5 kg)     GENERAL: Well-appearing elderly African-American male, alert, no distress and comfortable SKIN: Moderately swollen left without erythema.  Skin color, texture, turgor are normal, no rashes or significant lesions EYES: conjunctiva are pink and non-injected, sclera clear LUNGS: clear to auscultation and percussion with normal breathing effort HEART: regular rate & rhythm and no murmurs and no lower extremity edema PSYCH: alert & oriented x 3, fluent speech NEURO: no focal motor/sensory deficits  LABORATORY DATA:  I have reviewed the data as listed CBC Latest Ref Rng &  Units 02/15/2021 01/25/2021 01/16/2021  WBC 4.0 - 10.5 K/uL 3.1(L)  3.0(L) 4.0  Hemoglobin 13.0 - 17.0 g/dL 9.6(L) 10.0(L) 10.3(L)  Hematocrit 39.0 - 52.0 % 29.9(L) 31.7(L) 31.3(L)  Platelets 150 - 400 K/uL 252 392 271.0    CMP Latest Ref Rng & Units 02/15/2021 01/25/2021 01/16/2021  Glucose 70 - 99 mg/dL 206(H) 196(H) 98  BUN 8 - 23 mg/dL $Remove'10 8 7  'VGEzYHG$ Creatinine 0.61 - 1.24 mg/dL 0.63 0.78 0.69  Sodium 135 - 145 mmol/L 138 144 142  Potassium 3.5 - 5.1 mmol/L 3.8 3.8 3.4(L)  Chloride 98 - 111 mmol/L 104 108 103  CO2 22 - 32 mmol/L $RemoveB'27 26 30  'mtqYfANx$ Calcium 8.9 - 10.3 mg/dL 8.8(L) 9.4 9.5  Total Protein 6.5 - 8.1 g/dL 6.7 6.4(L) 6.8  Total Bilirubin 0.3 - 1.2 mg/dL 0.4 0.4 0.4  Alkaline Phos 38 - 126 U/L 63 69 62  AST 15 - 41 U/L $Remo'21 15 21  'HrDzk$ ALT 0 - 44 U/L $Remo'13 13 26    'SxFoX$ RADIOGRAPHIC STUDIES:  MR BRAIN W WO CONTRAST  Result Date: 02/05/2021 CLINICAL DATA:  75 year old male with metastatic lung cancer. Radiation treatment completed in June (reportedly whole brain radiation). Rest[facility]. EXAM: MRI HEAD WITHOUT AND WITH CONTRAST TECHNIQUE: Multiplanar, multiecho pulse sequences of the brain and surrounding structures were obtained without and with intravenous contrast. CONTRAST:  28mL GADAVIST GADOBUTROL 1 MMOL/ML IV SOLN COMPARISON:  Brain MRI 10/16/2020. FINDINGS: BRAIN New Lesions: None. Larger lesions: None. Stable or Smaller lesions: 12 mm enhancing lesion located in the medial right cerebellum and seen on series 17, image 53 (previously 22 mm). 11 mm lesion in the left occipital lobe posteriorly (series 17, image 87), previously 26 mm). 10 mm lesion in the medial right inferior parietal lobe near the parieto-occipital sulcus on image 108, previously 24 mm). Stellate roughly 15 mm diameter lesion along the right central sulcus (image 128), previously 33 mm). Vasogenic edema in each of these areas has largely resolved. Mild associated hemosiderin. Other Brain findings: No dural thickening identified. No restricted  diffusion suggestive of acute infarction. No midline shift, ventriculomegaly, extra-axial collection or acute intracranial hemorrhage. Scattered white matter T2 and FLAIR hyperintensity is stable, moderate for age. Mild T2 heterogeneity in the pons is stable. Cervicomedullary junction and pituitary are within normal limits. Vascular: Major intracranial vascular flow voids are stable. Dominant right vertebral artery. Skull and upper cervical spine: Negative. Visible bone marrow signal is normal. Sinuses/Orbits: Negative orbits. Trace paranasal sinus mucosal thickening. Other: Mild bilateral mastoid effusions are new and likely the sequelae of radiation, greater on the left. Negative visible nasopharynx. Other visible internal auditory structures appear grossly normal. Negative visible scalp and face. IMPRESSION: Regression of all four brain metastases, with no new or increased metastasis identified. Electronically Signed   By: Genevie Ann M.D.   On: 02/05/2021 11:15    ASSESSMENT & PLAN Benjamin Willis. 75 y.o. 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  After review the labs, reviewed records, discussion with the patient the findings are most consistent with metastatic adenocarcinoma with spread to the brain.  Unfortunately we do not have new pathology as the lesions in his brain were not resectable and he underwent radiation therapy instead.  We can presume based off his prior pathology that this represents the original adenocarcinoma.  As such I do believe it is reasonable to proceed with carboplatin, pembrolizumab, pemetrexed therapy in order to help treat any possible micrometastatic disease.  The  patient also underwent whole brain radiation therapy instead of SRS.  Therefore I do believe systemic therapy will be reasonable.  This plan of care was discussed with the resident lung expert Dr. Julien Nordmann. Additionally  our [place] 360 testing was negative for a targetable mutation.   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 Lost to follow up --presents with brain metastasis only. No evidence of disease in the C/A/P noted on CT scan from 10/16/2020.  --Patient completed whole brain radiation, but did not wish to undergo SRS.  Radiation oncology noted that this may be an option in the future if he were to have recurrence in the brain --Guardant 360 on 11/01/2020 showed no targetable mutations.  Plan: --plan to proceed with Carbo/Pem/Pem chemotherapy, Cycle 4 Day 1 today. --Plan for CT scan after this cycle of chemotherapy.  --continue to follow with neuro-oncology for brain metastasis --RTC in 3 weeks for Cycle 5 of chemotherapy with maintenance pembrolizumab/pemetrexed.   #Chemotherapy Induced Anemia #Chemotherapy Induced Leukopenia --today WBC 3.1 and Hgb 9.6. ANC 2.0. stable from prior --continue to monitor   #Supportive Care -- chemotherapy education complete  -- port placed --daily PO 1mg  folic acid with pemetrexed. -- zofran 8mg  q8H PRN and compazine 10mg  PO q6H for nausea -- EMLA cream for port -- no pain medication required at this time.   Orders Placed This Encounter  Procedures   CT CHEST ABDOMEN PELVIS W CONTRAST    Standing Status:   Future    Standing Expiration Date:   02/15/2022    Order Specific Question:   Preferred imaging location?    Answer:   [facility]    Order Specific Question:   Is Oral Contrast requested for this exam?    Answer:   Yes, Per Radiology protocol    Order Specific Question:   Reason for Exam (SYMPTOM  OR DIAGNOSIS REQUIRED)    Answer:   metastatic lung cancer, assess for recurence    All questions were answered. The patient knows to call the clinic with any problems, questions or concerns.  A total of more than 30 minutes were  spent on this encounter with face-to-face time and non-face-to-face time, including preparing to see the patient, ordering tests and/or medications, counseling the patient and coordination of care as outlined above.   Ledell Peoples, MD Department of Hematology/Oncology [place] [facility] Phone: [phone] Pager: [phone] Email: [email or web address]  02/15/2021 12:57 PM

## 2021-02-15 NOTE — Patient Instructions (Signed)
[facility] ONCOLOGY  Discharge Instructions: Thank you for choosing [facility] to provide your oncology and hematology care.   If you have a lab appointment with the [facility], please go directly to the [facility] and check in at the registration area.   Wear comfortable clothing and clothing appropriate for easy access to any Portacath or PICC line.   We strive to give you quality time with your provider. You may need to reschedule your appointment if you arrive late (15 or more minutes).  Arriving late affects you and other patients whose appointments are after yours.  Also, if you miss three or more appointments without notifying the office, you may be dismissed from the clinic at the provider's discretion.      For prescription refill requests, have your pharmacy contact our office and allow 72 hours for refills to be completed.    Today you received the following chemotherapy and/or immunotherapy agents Keytruda, Alimta, carboplatin      To help prevent nausea and vomiting after your treatment, we encourage you to take your nausea medication as directed.  BELOW ARE SYMPTOMS THAT SHOULD BE REPORTED IMMEDIATELY: *FEVER GREATER THAN 100.4 F (38 C) OR HIGHER *CHILLS OR SWEATING *NAUSEA AND VOMITING THAT IS NOT CONTROLLED WITH YOUR NAUSEA MEDICATION *UNUSUAL SHORTNESS OF BREATH *UNUSUAL BRUISING OR BLEEDING *URINARY PROBLEMS (pain or burning when urinating, or frequent urination) *BOWEL PROBLEMS (unusual diarrhea, constipation, pain near the anus) TENDERNESS IN MOUTH AND THROAT WITH OR WITHOUT PRESENCE OF ULCERS (sore throat, sores in mouth, or a toothache) UNUSUAL RASH, SWELLING OR PAIN  UNUSUAL VAGINAL DISCHARGE OR ITCHING   Items with * indicate a potential emergency and should be followed up as soon as possible or go to the Emergency Department if any problems should occur.  Please show the CHEMOTHERAPY ALERT CARD or IMMUNOTHERAPY ALERT  CARD at check-in to the Emergency Department and triage nurse.  Should you have questions after your visit or need to cancel or reschedule your appointment, please contact [facility]  Dept: [phone]  and follow the prompts.  Office hours are 8:00 a.m. to 4:30 p.m. Monday - Friday. Please note that voicemails left after 4:00 p.m. may not be returned until the following business day.  We are closed weekends and major holidays. You have access to a nurse at all times for urgent questions. Please call the main number to the clinic Dept: [phone] and follow the prompts.   For any non-urgent questions, you may also contact your provider using MyChart. We now offer e-Visits for anyone 4 and older to request care online for non-urgent symptoms. For details visit [email or web address].   Also download the MyChart app! Go to the app store, search "MyChart", open the app, select [facility], and log in with your MyChart username and password.  Due to Covid, a mask is required upon entering the hospital/clinic. If you do not have a mask, one will be given to you upon arrival. For doctor visits, patients may have 1 support person aged 47 or older with them. For treatment visits, patients cannot have anyone with them due to current Covid guidelines and our immunocompromised population.

## 2021-02-18 DIAGNOSIS — E119 Type 2 diabetes mellitus without complications: Secondary | ICD-10-CM | POA: Diagnosis not present

## 2021-02-18 DIAGNOSIS — I10 Essential (primary) hypertension: Secondary | ICD-10-CM | POA: Diagnosis not present

## 2021-02-18 DIAGNOSIS — C7931 Secondary malignant neoplasm of brain: Secondary | ICD-10-CM | POA: Diagnosis not present

## 2021-02-18 DIAGNOSIS — D630 Anemia in neoplastic disease: Secondary | ICD-10-CM | POA: Diagnosis not present

## 2021-02-18 DIAGNOSIS — N390 Urinary tract infection, site not specified: Secondary | ICD-10-CM | POA: Diagnosis not present

## 2021-02-18 DIAGNOSIS — C3490 Malignant neoplasm of unspecified part of unspecified bronchus or lung: Secondary | ICD-10-CM | POA: Diagnosis not present

## 2021-02-20 ENCOUNTER — Telehealth: Payer: Self-pay | Admitting: *Deleted

## 2021-02-20 NOTE — Telephone Encounter (Signed)
CALLED PATIENT TO ASK ABOUT RESCHEDULING MISSED FU ON 02-05-21, UNABLE TO LEAVE VM DUE TO ONE NOT BEING SET UP

## 2021-02-22 DIAGNOSIS — C3490 Malignant neoplasm of unspecified part of unspecified bronchus or lung: Secondary | ICD-10-CM | POA: Diagnosis not present

## 2021-02-22 DIAGNOSIS — E119 Type 2 diabetes mellitus without complications: Secondary | ICD-10-CM | POA: Diagnosis not present

## 2021-02-22 DIAGNOSIS — D630 Anemia in neoplastic disease: Secondary | ICD-10-CM | POA: Diagnosis not present

## 2021-02-22 DIAGNOSIS — C7931 Secondary malignant neoplasm of brain: Secondary | ICD-10-CM | POA: Diagnosis not present

## 2021-02-22 DIAGNOSIS — I10 Essential (primary) hypertension: Secondary | ICD-10-CM | POA: Diagnosis not present

## 2021-02-22 DIAGNOSIS — N390 Urinary tract infection, site not specified: Secondary | ICD-10-CM | POA: Diagnosis not present

## 2021-02-26 ENCOUNTER — Other Ambulatory Visit: Payer: Self-pay | Admitting: Emergency Medicine

## 2021-02-26 DIAGNOSIS — I10 Essential (primary) hypertension: Secondary | ICD-10-CM

## 2021-02-27 DIAGNOSIS — Z515 Encounter for palliative care: Secondary | ICD-10-CM | POA: Diagnosis not present

## 2021-02-27 DIAGNOSIS — C7931 Secondary malignant neoplasm of brain: Secondary | ICD-10-CM | POA: Diagnosis not present

## 2021-02-27 DIAGNOSIS — C3490 Malignant neoplasm of unspecified part of unspecified bronchus or lung: Secondary | ICD-10-CM | POA: Diagnosis not present

## 2021-02-27 DIAGNOSIS — G4089 Other seizures: Secondary | ICD-10-CM | POA: Diagnosis not present

## 2021-02-28 DIAGNOSIS — E119 Type 2 diabetes mellitus without complications: Secondary | ICD-10-CM | POA: Diagnosis not present

## 2021-02-28 DIAGNOSIS — N390 Urinary tract infection, site not specified: Secondary | ICD-10-CM | POA: Diagnosis not present

## 2021-02-28 DIAGNOSIS — I10 Essential (primary) hypertension: Secondary | ICD-10-CM | POA: Diagnosis not present

## 2021-02-28 DIAGNOSIS — D630 Anemia in neoplastic disease: Secondary | ICD-10-CM | POA: Diagnosis not present

## 2021-02-28 DIAGNOSIS — C7931 Secondary malignant neoplasm of brain: Secondary | ICD-10-CM | POA: Diagnosis not present

## 2021-02-28 DIAGNOSIS — C3490 Malignant neoplasm of unspecified part of unspecified bronchus or lung: Secondary | ICD-10-CM | POA: Diagnosis not present

## 2021-03-04 DIAGNOSIS — E119 Type 2 diabetes mellitus without complications: Secondary | ICD-10-CM | POA: Diagnosis not present

## 2021-03-04 DIAGNOSIS — H40013 Open angle with borderline findings, low risk, bilateral: Secondary | ICD-10-CM | POA: Diagnosis not present

## 2021-03-04 DIAGNOSIS — H25813 Combined forms of age-related cataract, bilateral: Secondary | ICD-10-CM | POA: Diagnosis not present

## 2021-03-05 ENCOUNTER — Other Ambulatory Visit: Payer: Self-pay

## 2021-03-05 ENCOUNTER — Telehealth: Payer: Self-pay | Admitting: *Deleted

## 2021-03-05 ENCOUNTER — Ambulatory Visit (HOSPITAL_COMMUNITY)
Admission: RE | Admit: 2021-03-05 | Discharge: 2021-03-05 | Disposition: A | Discharge status: Home / Self Care | Payer: Medicare Other | Source: Ambulatory Visit | Attending: Hematology and Oncology | Admitting: Hematology and Oncology

## 2021-03-05 DIAGNOSIS — I2510 Atherosclerotic heart disease of native coronary artery without angina pectoris: Secondary | ICD-10-CM | POA: Diagnosis not present

## 2021-03-05 DIAGNOSIS — J479 Bronchiectasis, uncomplicated: Secondary | ICD-10-CM | POA: Diagnosis not present

## 2021-03-05 DIAGNOSIS — K7689 Other specified diseases of liver: Secondary | ICD-10-CM | POA: Diagnosis not present

## 2021-03-05 DIAGNOSIS — K429 Umbilical hernia without obstruction or gangrene: Secondary | ICD-10-CM | POA: Diagnosis not present

## 2021-03-05 DIAGNOSIS — C3490 Malignant neoplasm of unspecified part of unspecified bronchus or lung: Secondary | ICD-10-CM | POA: Insufficient documentation

## 2021-03-05 DIAGNOSIS — I700 Atherosclerosis of aorta: Secondary | ICD-10-CM | POA: Diagnosis not present

## 2021-03-05 MED ORDER — IOHEXOL 350 MG/ML SOLN
80.0000 mL | Freq: Once | INTRAVENOUS | Status: AC | PRN
  Administered 2021-03-05: 80 mL via INTRAVENOUS

## 2021-03-05 NOTE — Telephone Encounter (Signed)
Called patient's sister to reschedule fu, no answer, unable to leave message due to vm not being set up

## 2021-03-06 ENCOUNTER — Telehealth: Payer: Self-pay

## 2021-03-06 NOTE — Telephone Encounter (Signed)
-----   Message from Otila Kluver, RN sent at 03/06/2021  3:18 PM EDT ----- Call his sister ----- Message ----- From: Orson Slick, MD Sent: 03/06/2021   7:34 AM EDT To: Otila Kluver, RN  Please let Mr. Taney know that his CT scan of the C/A/P shows no evidence of disease. We will continue his treatment q 3 weeks.   ----- Message ----- From: Interface, Rad Results In Sent: 03/05/2021  12:50 PM EDT To: Orson Slick, MD

## 2021-03-06 NOTE — Telephone Encounter (Addendum)
Attempted to contact pt's sister, [name].  No answer  no voicemail  available to leave a message.  *Second attempt made to contact pt's sister to notify her of Ct results. No answer.  HB

## 2021-03-08 ENCOUNTER — Inpatient Hospital Stay (HOSPITAL_BASED_OUTPATIENT_CLINIC_OR_DEPARTMENT_OTHER): Payer: Medicare Other | Admitting: Hematology and Oncology

## 2021-03-08 ENCOUNTER — Other Ambulatory Visit: Payer: Self-pay

## 2021-03-08 ENCOUNTER — Inpatient Hospital Stay: Payer: Medicare Other

## 2021-03-08 ENCOUNTER — Inpatient Hospital Stay: Payer: Medicare Other | Attending: Hematology and Oncology

## 2021-03-08 VITALS — BP 141/85 | HR 81 | Temp 98.5°F | Resp 17 | Wt 149.8 lb

## 2021-03-08 DIAGNOSIS — C3490 Malignant neoplasm of unspecified part of unspecified bronchus or lung: Secondary | ICD-10-CM

## 2021-03-08 DIAGNOSIS — C3491 Malignant neoplasm of unspecified part of right bronchus or lung: Secondary | ICD-10-CM | POA: Diagnosis not present

## 2021-03-08 DIAGNOSIS — Z95828 Presence of other vascular implants and grafts: Secondary | ICD-10-CM

## 2021-03-08 DIAGNOSIS — D6481 Anemia due to antineoplastic chemotherapy: Secondary | ICD-10-CM | POA: Insufficient documentation

## 2021-03-08 DIAGNOSIS — E049 Nontoxic goiter, unspecified: Secondary | ICD-10-CM | POA: Insufficient documentation

## 2021-03-08 DIAGNOSIS — C7931 Secondary malignant neoplasm of brain: Secondary | ICD-10-CM | POA: Diagnosis not present

## 2021-03-08 DIAGNOSIS — E032 Hypothyroidism due to medicaments and other exogenous substances: Secondary | ICD-10-CM

## 2021-03-08 DIAGNOSIS — D701 Agranulocytosis secondary to cancer chemotherapy: Secondary | ICD-10-CM | POA: Insufficient documentation

## 2021-03-08 DIAGNOSIS — Z5112 Encounter for antineoplastic immunotherapy: Secondary | ICD-10-CM | POA: Insufficient documentation

## 2021-03-08 DIAGNOSIS — Z5111 Encounter for antineoplastic chemotherapy: Secondary | ICD-10-CM | POA: Diagnosis not present

## 2021-03-08 LAB — CMP (CANCER CENTER ONLY)
ALT: 10 U/L (ref 0–44)
AST: 17 U/L (ref 15–41)
Albumin: 3.7 g/dL (ref 3.5–5.0)
Alkaline Phosphatase: 76 U/L (ref 38–126)
Anion gap: 8 (ref 5–15)
BUN: 10 mg/dL (ref 8–23)
CO2: 27 mmol/L (ref 22–32)
Calcium: 8.8 mg/dL — ABNORMAL LOW (ref 8.9–10.3)
Chloride: 107 mmol/L (ref 98–111)
Creatinine: 0.71 mg/dL (ref 0.61–1.24)
GFR, Estimated: >60 mL/min (ref >60)
Glucose, Bld: 189 mg/dL — ABNORMAL HIGH (ref 70–99)
Potassium: 3.8 mmol/L (ref 3.5–5.1)
Sodium: 142 mmol/L (ref 135–145)
Total Bilirubin: 0.3 mg/dL (ref 0.3–1.2)
Total Protein: 6.4 g/dL — ABNORMAL LOW (ref 6.5–8.1)

## 2021-03-08 LAB — CBC WITH DIFFERENTIAL (CANCER CENTER ONLY)
Abs Immature Granulocytes: 0.01 10*3/uL (ref 0.00–0.07)
Basophils Absolute: 0 10*3/uL (ref 0.0–0.1)
Basophils Relative: 1 %
Eosinophils Absolute: 0 10*3/uL (ref 0.0–0.5)
Eosinophils Relative: 1 %
HCT: 29.4 % — ABNORMAL LOW (ref 39.0–52.0)
Hemoglobin: 9.8 g/dL — ABNORMAL LOW (ref 13.0–17.0)
Immature Granulocytes: 0 %
Lymphocytes Relative: 23 %
Lymphs Abs: 0.6 10*3/uL — ABNORMAL LOW (ref 0.7–4.0)
MCH: 32.8 pg (ref 26.0–34.0)
MCHC: 33.3 g/dL (ref 30.0–36.0)
MCV: 98.3 fL (ref 80.0–100.0)
Monocytes Absolute: 0.4 10*3/uL (ref 0.1–1.0)
Monocytes Relative: 16 %
Neutro Abs: 1.5 10*3/uL — ABNORMAL LOW (ref 1.7–7.7)
Neutrophils Relative %: 59 %
Platelet Count: 221 10*3/uL (ref 150–400)
RBC: 2.99 MIL/uL — ABNORMAL LOW (ref 4.22–5.81)
RDW: 14.2 % (ref 11.5–15.5)
WBC Count: 2.6 10*3/uL — ABNORMAL LOW (ref 4.0–10.5)
nRBC: 0 % (ref 0.0–0.2)

## 2021-03-08 LAB — TSH: TSH: 1.363 u[IU]/mL (ref 0.320–4.118)

## 2021-03-08 MED ORDER — PROCHLORPERAZINE MALEATE 10 MG PO TABS
10.0000 mg | ORAL_TABLET | Freq: Once | ORAL | Status: AC
  Administered 2021-03-08: 10 mg via ORAL
  Filled 2021-03-08: qty 1

## 2021-03-08 MED ORDER — SODIUM CHLORIDE 0.9 % IV SOLN: Freq: Once | INTRAVENOUS | Status: AC

## 2021-03-08 MED ORDER — HEPARIN SOD (PORK) LOCK FLUSH 100 UNIT/ML IV SOLN
500.0000 [IU] | Freq: Once | INTRAVENOUS | Status: AC | PRN
  Administered 2021-03-08: 500 [IU]

## 2021-03-08 MED ORDER — SODIUM CHLORIDE 0.9 % IV SOLN
500.0000 mg/m2 | Freq: Once | INTRAVENOUS | Status: AC
  Administered 2021-03-08: 900 mg via INTRAVENOUS
  Filled 2021-03-08: qty 20

## 2021-03-08 MED ORDER — SODIUM CHLORIDE 0.9% FLUSH
10.0000 mL | INTRAVENOUS | Status: DC | PRN
  Administered 2021-03-08: 10 mL

## 2021-03-08 MED ORDER — SODIUM CHLORIDE 0.9 % IV SOLN
200.0000 mg | Freq: Once | INTRAVENOUS | Status: AC
  Administered 2021-03-08: 200 mg via INTRAVENOUS
  Filled 2021-03-08: qty 8

## 2021-03-08 MED ORDER — SODIUM CHLORIDE 0.9% FLUSH
10.0000 mL | Freq: Once | INTRAVENOUS | Status: AC
  Administered 2021-03-08: 10 mL

## 2021-03-08 NOTE — Patient Instructions (Signed)
[facility] ONCOLOG[facility]  Discharge Instructions: Thank you for choosing [facility] to provide your oncology and hematology care.   If you have a lab appointment with the [facility], please go directly to the [facility] and check in at the registration area.   Wear comfortable clothing and clothing appropriate for easy access to any Portacath or PICC line.   We strive to give you quality time with your provider. You may need to reschedule your appointment if you arrive late (15 or more minutes).  Arriving late affects you and other patients whose appointments are after yours.  Also, if you miss three or more appointments without notifying the office, you may be dismissed from the clinic at the provider's discretion.      For prescription refill requests, have your pharmacy contact our office and allow 72 hours for refills to be completed.    Today you received the following chemotherapy and/or immunotherapy agents Keytruda and alimta       To help prevent nausea and vomiting after your treatment, we encourage you to take your nausea medication as directed.  BELOW ARE SYMPTOMS THAT SHOULD BE REPORTED IMMEDIATELY: *FEVER GREATER THAN 100.4 F (38 C) OR HIGHER *CHILLS OR SWEATING *NAUSEA AND VOMITING THAT IS NOT CONTROLLED WITH YOUR NAUSEA MEDICATION *UNUSUAL SHORTNESS OF BREATH *UNUSUAL BRUISING OR BLEEDING *URINARY PROBLEMS (pain or burning when urinating, or frequent urination) *BOWEL PROBLEMS (unusual diarrhea, constipation, pain near the anus) TENDERNESS IN MOUTH AND THROAT WITH OR WITHOUT PRESENCE OF ULCERS (sore throat, sores in mouth, or a toothache) UNUSUAL RASH, SWELLING OR PAIN  UNUSUAL VAGINAL DISCHARGE OR ITCHING   Items with * indicate a potential emergency and should be followed up as soon as possible or go to the Emergency Department if any problems should occur.  Please show the CHEMOTHERAPY ALERT CARD or IMMUNOTHERAPY ALERT CARD at  check-in to the Emergency Department and triage nurse.  Should you have questions after your visit or need to cancel or reschedule your appointment, please contact [facility]  Dept: [phone]  and follow the prompts.  Office hours are 8:00 a.m. to 4:30 p.m. Monday - Friday. Please note that voicemails left after 4:00 p.m. may not be returned until the following business day.  We are closed weekends and major holidays. You have access to a nurse at all times for urgent questions. Please call the main number to the clinic Dept: [phone] and follow the prompts.   For any non-urgent questions, you may also contact your provider using MyChart. We now offer e-Visits for anyone 22 and older to request care online for non-urgent symptoms. For details visit [email or web address].   Also download the MyChart app! Go to the app store, search "MyChart", open the app, select [facility], and log in with your MyChart username and password.  Due to Covid, a mask is required upon entering the hospital/clinic. If you do not have a mask, one will be given to you upon arrival. For doctor visits, patients may have 1 support person aged 44 or older with them. For treatment visits, patients cannot have anyone with them due to current Covid guidelines and our immunocompromised population.

## 2021-03-08 NOTE — Progress Notes (Signed)
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  This was followed by an MRI of the brain with and without contrast which showed 4 brain lesions consistent with metastases, extensive vasogenic edema associated with the posterior right frontal lesion without midline shift. 6/15-6/28/2022: 10 fractions of WBRT with Rad/Onc 11/23/2020: Guardant 360 returns with no targetable mutation.  12/14/2020: Cycle 1 Day 1 of Carbo/Pem/Pem 01/04/2021: Cycle 2 Day 1 of Carbo/Pem/Pem 01/25/2021: Cycle 3 Day 1 of Carbo/Pem/Pem 02/15/2021: Cycle 4 Day 1 of Carbo/Pem/Pem 03/08/2021: Cycle 5 Day 1 of maintenance Pem/Pem  Interval History:  Baron Hamper. 75 y.o. 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 The patient's last visit was on 02/15/2021.  In the interim since the last visit he has completed Cycle 4 of chemotherapy. He presents today for Cycle 5.   On exam today Mrs. Nakama is unaccompanied.  He reports he has been well in the interim since her last visit.  He notes that he has good levels of energy and is actually been outside doing weekly.  He does walk for shorter periods of distances but today is in a wheelchair as walking through the cancer center in the parking lot is too far for him. He  denies any vision changes or headache.  He also denies any shortness of breath or chest pain.  He currently denies any fevers, chills, sweats, nausea, vomiting or diarrhea.  Full 10 point ROS is listed below.  MEDICAL HISTORY:  Past Medical History:  Diagnosis Date   Allergy    Diabetes mellitus without complication (HCC)    Type II   Glaucoma 10/16/2020   History of radiation therapy    [place]- SBRT 02/21/19-02/28/19, Rt Lung- IMRT 02/29/20-04/16/20 Dr. Antony Blackbird   History of radiation therapy 10/30/2020   whole brain 10/17/2020-10/30/2020 Dr Antony Blackbird   Hyperlipidemia    Hypertension    Lung cancer (HCC)    Stage IA2 (cT1b, N0) non-small cell lung cancer (adenocarcinoma)    SURGICAL HISTORY: Past Surgical History:  Procedure Laterality Date   ELECTROMAGNETIC NAVIGATION BROCHOSCOPY Right 02/02/2019   Procedure: VIDEO BRONCHOSCOPY WITH NAVIGATION AND FIDUCIAL PLACEMENT;  Surgeon: Josephine Igo, DO;  Location: [facility] OR;  Service: Cardiopulmonary;  Laterality: Right;   FUDUCIAL PLACEMENT Right 02/02/2019   Procedure: Placement Of Fuducial Right Upper Lobe;  Surgeon: Josephine Igo, DO;  Location: [facility] OR;  Service: Cardiopulmonary;  Laterality: Right;   IR IMAGING GUIDED PORT INSERTION  12/13/2020   none     VIDEO BRONCHOSCOPY WITH RADIAL ENDOBRONCHIAL ULTRASOUND Right 02/02/2019   Procedure: Video Bronchoscopy With Radial Endobronchial Ultrasound;  Surgeon: Josephine Igo, DO;  Location: [facility] OR;  Service: Cardiopulmonary;  Laterality: Right;    SOCIAL HISTORY: Social History   Socioeconomic History   Marital status: Single    Spouse name: Not on file   Number of children: 0  Years of education: Not on file   Highest education level: 9th grade  Occupational History   Not on file  Tobacco Use   Smoking status: Former    Packs/day: 1.00    Years: 48.00    Pack years: 48.00    Types: Cigarettes    Quit date: 02/20/2015    Years since quitting: 6.0   Smokeless tobacco:  Former    Quit date: 10/2013  Vaping Use   Vaping Use: Never used  Substance and Sexual Activity   Alcohol use: No   Drug use: No   Sexual activity: Yes    Birth control/protection: Condom  Other Topics Concern   Not on file  Social History Narrative   Not on file   Social Determinants of Health   Financial Resource Strain: Not on file  Food Insecurity: Not on file  Transportation Needs: Not on file  Physical Activity: Not on file  Stress: Not on file  Social Connections: Not on file  Intimate Partner Violence: Not on file    FAMILY HISTORY: Family History  Problem Relation Age of Onset   Lung cancer Neg Hx     ALLERGIES:  has No Known Allergies.  MEDICATIONS:  Current Outpatient Medications  Medication Sig Dispense Refill   acetaminophen (TYLENOL) 500 MG tablet Take 1,000 mg by mouth every 6 (six) hours as needed for mild pain.     albuterol (VENTOLIN HFA) 108 (90 Base) MCG/ACT inhaler Inhale 2 puffs into the lungs every 6 (six) hours as needed for wheezing or shortness of breath. 18 g 6   amLODipine (NORVASC) 10 MG tablet Take 1 tablet by mouth once daily 90 tablet 0   ammonium lactate (AMLACTIN) 12 % lotion Apply 1 application topically as needed for dry skin. 400 g 0   blood glucose meter kit and supplies KIT Dispense based on patient and insurance preference. Use up to four times daily as directed. 1 each 0   Blood Pressure Monitoring (BLOOD PRESSURE MONITOR/ARM) DEVI Check BP once a day. DX I 10 1 each 0   cetirizine (ZYRTEC) 10 MG tablet Take 1 tablet (10 mg total) by mouth daily. (Patient taking differently: Take 10 mg by mouth daily as needed for allergies.) 30 tablet 11   erythromycin ophthalmic ointment Place 1 application into the left eye at bedtime. 3.5 g 0   folic acid (FOLVITE) 1 MG tablet Take 1 tablet (1 mg total) by mouth daily. 90 tablet 1   furosemide (LASIX) 20 MG tablet Take 20 mg by mouth daily.     gabapentin (NEURONTIN) 300 MG capsule Take 1  capsule (300 mg total) by mouth at bedtime. 90 capsule 1   glipiZIDE (GLUCOTROL) 5 MG tablet TAKE 1 TABLET BY MOUTH TWICE DAILY WITH A MEAL (Patient taking differently: Take 5 mg by mouth 2 (two) times daily before a meal.) 180 tablet 0   latanoprost (XALATAN) 0.005 % ophthalmic solution Place 1 drop into both eyes at bedtime. 2.5 mL 3   levETIRAcetam (KEPPRA) 1000 MG tablet Take 1 tablet (1,000 mg total) by mouth 2 (two) times daily. 180 tablet 1   lidocaine-prilocaine (EMLA) cream Apply 1 application topically as needed. (Patient taking differently: Apply 1 application topically as needed (for port before tx).) 30 g 0   lisinopril (ZESTRIL) 40 MG tablet Take 1 tablet (40 mg total) by mouth daily. 90 tablet 3   metFORMIN (GLUCOPHAGE) 500 MG tablet TAKE 1 TABLET BY MOUTH TWICE DAILY WITH MEALS (Patient  taking differently: Take 500 mg by mouth daily with breakfast.) 180 tablet 0   Multiple Vitamins-Minerals (MULTIVITAMIN MEN) TABS Take 1 tablet by mouth daily.     ondansetron (ZOFRAN) 8 MG tablet Take 1 tablet (8 mg total) by mouth every 8 (eight) hours as needed for nausea or vomiting. 30 tablet 0   potassium chloride SA (KLOR-CON) 20 MEQ tablet Take 20 mEq by mouth daily.     prochlorperazine (COMPAZINE) 10 MG tablet Take 1 tablet (10 mg total) by mouth every 6 (six) hours as needed for nausea or vomiting. 30 tablet 0   rosuvastatin (CRESTOR) 20 MG tablet Take by mouth.     sitaGLIPtin (JANUVIA) 100 MG tablet Take 1 tablet (100 mg total) by mouth daily. 90 tablet 3   No current facility-administered medications for this visit.   Facility-Administered Medications Ordered in Other Visits  Medication Dose Route Frequency Provider Last Rate Last Admin   heparin lock flush 100 unit/mL  500 Units Intracatheter Once PRN Orson Slick, MD       PEMEtrexed (ALIMTA) 900 mg in sodium chloride 0.9 % 100 mL chemo infusion  500 mg/m2 (Treatment Plan Recorded) Intravenous Once Orson Slick, MD        sodium chloride flush (NS) 0.9 % injection 10 mL  10 mL Intracatheter PRN Orson Slick, MD        REVIEW OF SYSTEMS:   Constitutional: ( - ) fevers, ( - )  chills , ( - ) night sweats Eyes: ( - ) blurriness of vision, ( - ) double vision, ( - ) watery eyes Ears, nose, mouth, throat, and face: ( - ) mucositis, ( - ) sore throat Respiratory: ( - ) cough, ( - ) dyspnea, ( - ) wheezes Cardiovascular: ( - ) palpitation, ( - ) chest discomfort, ( - ) lower extremity swelling Gastrointestinal:  ( - ) nausea, ( - ) heartburn, ( - ) change in bowel habits Skin: ( - ) abnormal skin rashes Lymphatics: ( - ) new lymphadenopathy, ( - ) easy bruising Neurological: ( - ) numbness, ( - ) tingling, ( - ) new weaknesses Behavioral/Psych: ( - ) mood change, ( - ) new changes  All other systems were reviewed with the patient and are negative.  PHYSICAL EXAMINATION:  Vitals:   03/08/21 1320  BP: (!) 141/85  Pulse: 81  Resp: 17  Temp: 98.5 F (36.9 C)  SpO2: 100%    Filed Weights   03/08/21 1320  Weight: 149 lb 12.8 oz (67.9 kg)     GENERAL: Well-appearing elderly African-American male, alert, no distress and comfortable SKIN:  Skin color, texture, turgor are normal, no rashes or significant lesions EYES: conjunctiva are pink and non-injected, sclera clear LUNGS: clear to auscultation and percussion with normal breathing effort HEART: regular rate & rhythm and no murmurs and no lower extremity edema PSYCH: alert & oriented x 3, fluent speech NEURO: no focal motor/sensory deficits  LABORATORY DATA:  I have reviewed the data as listed CBC Latest Ref Rng & Units 03/08/2021 02/15/2021 01/25/2021  WBC 4.0 - 10.5 K/uL 2.6(L) 3.1(L) 3.0(L)  Hemoglobin 13.0 - 17.0 g/dL 9.8(L) 9.6(L) 10.0(L)  Hematocrit 39.0 - 52.0 % 29.4(L) 29.9(L) 31.7(L)  Platelets 150 - 400 K/uL 221 252 392    CMP Latest Ref Rng & Units 03/08/2021 02/15/2021 01/25/2021  Glucose 70 - 99 mg/dL 189(H) 206(H) 196(H)  BUN 8 -  23 mg/dL 10 10 8  Creatinine 0.61 - 1.24 mg/dL 0.71 0.63 0.78  Sodium 135 - 145 mmol/L 142 138 144  Potassium 3.5 - 5.1 mmol/L 3.8 3.8 3.8  Chloride 98 - 111 mmol/L 107 104 108  CO2 22 - 32 mmol/L $RemoveB'27 27 26  'nnbEcfcA$ Calcium 8.9 - 10.3 mg/dL 8.8(L) 8.8(L) 9.4  Total Protein 6.5 - 8.1 g/dL 6.4(L) 6.7 6.4(L)  Total Bilirubin 0.3 - 1.2 mg/dL 0.3 0.4 0.4  Alkaline Phos 38 - 126 U/L 76 63 69  AST 15 - 41 U/L $Remo'17 21 15  'LtoZU$ ALT 0 - 44 U/L $Remo'10 13 13    'xaUMp$ RADIOGRAPHIC STUDIES:  CT CHEST ABDOMEN PELVIS W CONTRAST  Result Date: 03/05/2021 CLINICAL DATA:  Follow-up metastatic non-small cell lung carcinoma. Undergoing chemotherapy. Previous radiation therapy. EXAM: CT CHEST, ABDOMEN, AND PELVIS WITH CONTRAST TECHNIQUE: Multidetector CT imaging of the chest, abdomen and pelvis was performed following the standard protocol during bolus administration of intravenous contrast. CONTRAST:  60mL OMNIPAQUE IOHEXOL 350 MG/ML SOLN COMPARISON:  10/16/2020 FINDINGS: CT CHEST FINDINGS Cardiovascular: No acute findings. Aortic atherosclerotic calcification noted. Mediastinum/Lymph Nodes: No masses or pathologically enlarged lymph nodes identified. Stable mild goiter, without dominant nodule. Lungs/Pleura: Stable ill-defined opacity in the central right upper lobe with associated mild-to-moderate central bronchiectasis, most consistent with post radiation changes. No suspicious pulmonary nodules or masses identified. No evidence of infiltrate or pleural effusion. Musculoskeletal:  No suspicious bone lesions identified. CT ABDOMEN AND PELVIS FINDINGS Hepatobiliary: No masses identified. Stable tiny sub-cm left hepatic lobe cysts again noted. Gallbladder is unremarkable. No evidence of biliary ductal dilatation. Pancreas:  No mass or inflammatory changes. Spleen:  Within normal limits in size and appearance. Adrenals/Urinary tract: Stable 13 mm right adrenal nodule. No renal masses or hydronephrosis. Unremarkable unopacified urinary bladder.  Stomach/Bowel: No evidence of obstruction, inflammatory process, or abnormal fluid collections. Large stool burden seen throughout the colon. Vascular/Lymphatic: No pathologically enlarged lymph nodes identified. No acute vascular findings. Aortic atherosclerotic calcification noted. Reproductive:  No mass or other significant abnormality identified. Other:  A tiny umbilical hernia is noted which contains only fat. Musculoskeletal:  No suspicious bone lesions identified. IMPRESSION: Stable post radiation changes in central right upper lobe. Stable 13 mm right adrenal nodule. No new or progressive disease within the chest, abdomen, or pelvis. Large stool burden noted; recommend clinical correlation for possible constipation. Aortic Atherosclerosis (ICD10-I70.0). Electronically Signed   By: Marlaine Hind M.D.   On: 03/05/2021 12:47    ASSESSMENT & PLAN Benjamin Willis. 75 y.o. 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  After review the labs, reviewed records, discussion with the patient the findings are most consistent with metastatic adenocarcinoma with spread to the brain.  Unfortunately we do not have new pathology as the lesions in his brain were not resectable and he underwent radiation therapy instead.  We can presume based off his prior pathology that this represents the original adenocarcinoma.  As such I do believe it is reasonable to proceed with carboplatin, pembrolizumab, pemetrexed therapy in order to help treat any possible micrometastatic disease.  The patient also underwent whole brain radiation therapy instead of SRS.  Therefore I do believe systemic therapy will be reasonable.  This plan of care was discussed with the resident lung expert Dr. Julien Nordmann. Additionally our [place] 360 testing was negative for a targetable mutation.   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 Lost to follow up --presents with brain metastasis only. No evidence of disease in the C/A/P noted on CT scan from 10/16/2020.  --Patient completed whole brain radiation, but did not wish to undergo SRS.  Radiation oncology noted that this may be an option in the future if he were to have recurrence in the brain --Guardant 360 on 11/01/2020 showed no targetable mutations.  Plan: --plan to proceed with maintenance Pem/Pem chemotherapy, Cycle 5 Day 1 today. --Plan for CT in 3 months (Feb 2023) --continue to follow with neuro-oncology for brain metastasis --RTC in 3 weeks for Cycle 6 of chemotherapy with maintenance pembrolizumab/pemetrexed.   #Chemotherapy Induced Anemia #Chemotherapy Induced Leukopenia --today WBC 2.6 and Hgb 9.8. ANC 1.5. stable from prior --continue to monitor   #Supportive Care -- chemotherapy education complete  -- port placed --daily PO 1mg  folic acid with pemetrexed. -- zofran 8mg  q8H PRN and compazine 10mg  PO q6H for nausea -- EMLA cream for port -- no pain medication required at this time.   No orders of the defined types were placed in this encounter.   All questions were answered. The patient knows to call the clinic with any problems, questions or concerns.  A total of more than 30 minutes were spent on this encounter with face-to-face time and non-face-to-face time, including preparing to see the patient, ordering tests and/or medications, counseling the patient and coordination of care as outlined above.   Ledell Peoples, MD Department of Hematology/Oncology [place] at [facility] Phone: [phone] Pager: [phone] Email: [email or web address]  03/08/2021 3:44 PM

## 2021-03-20 DIAGNOSIS — M1990 Unspecified osteoarthritis, unspecified site: Secondary | ICD-10-CM | POA: Diagnosis not present

## 2021-03-20 DIAGNOSIS — E119 Type 2 diabetes mellitus without complications: Secondary | ICD-10-CM | POA: Diagnosis not present

## 2021-03-20 DIAGNOSIS — I10 Essential (primary) hypertension: Secondary | ICD-10-CM | POA: Diagnosis not present

## 2021-03-20 DIAGNOSIS — R2681 Unsteadiness on feet: Secondary | ICD-10-CM | POA: Diagnosis not present

## 2021-03-20 DIAGNOSIS — E663 Overweight: Secondary | ICD-10-CM | POA: Diagnosis not present

## 2021-03-20 DIAGNOSIS — Z6825 Body mass index (BMI) 25.0-25.9, adult: Secondary | ICD-10-CM | POA: Diagnosis not present

## 2021-03-20 DIAGNOSIS — Z0000 Encounter for general adult medical examination without abnormal findings: Secondary | ICD-10-CM | POA: Diagnosis not present

## 2021-03-29 ENCOUNTER — Inpatient Hospital Stay: Payer: Medicare Other

## 2021-03-29 ENCOUNTER — Other Ambulatory Visit: Payer: Self-pay

## 2021-03-29 ENCOUNTER — Inpatient Hospital Stay (HOSPITAL_BASED_OUTPATIENT_CLINIC_OR_DEPARTMENT_OTHER): Payer: Medicare Other | Admitting: Physician Assistant

## 2021-03-29 ENCOUNTER — Other Ambulatory Visit: Payer: Self-pay | Admitting: Hematology and Oncology

## 2021-03-29 ENCOUNTER — Telehealth: Payer: Self-pay | Admitting: Physician Assistant

## 2021-03-29 VITALS — BP 155/89 | HR 74 | Temp 98.4°F | Resp 17 | Wt 151.1 lb

## 2021-03-29 DIAGNOSIS — C3411 Malignant neoplasm of upper lobe, right bronchus or lung: Secondary | ICD-10-CM | POA: Diagnosis not present

## 2021-03-29 DIAGNOSIS — C7931 Secondary malignant neoplasm of brain: Secondary | ICD-10-CM

## 2021-03-29 DIAGNOSIS — C3490 Malignant neoplasm of unspecified part of unspecified bronchus or lung: Secondary | ICD-10-CM

## 2021-03-29 DIAGNOSIS — E032 Hypothyroidism due to medicaments and other exogenous substances: Secondary | ICD-10-CM

## 2021-03-29 DIAGNOSIS — Z5111 Encounter for antineoplastic chemotherapy: Secondary | ICD-10-CM | POA: Diagnosis not present

## 2021-03-29 DIAGNOSIS — R29898 Other symptoms and signs involving the musculoskeletal system: Secondary | ICD-10-CM | POA: Diagnosis not present

## 2021-03-29 DIAGNOSIS — D701 Agranulocytosis secondary to cancer chemotherapy: Secondary | ICD-10-CM | POA: Diagnosis not present

## 2021-03-29 DIAGNOSIS — Z5112 Encounter for antineoplastic immunotherapy: Secondary | ICD-10-CM | POA: Diagnosis not present

## 2021-03-29 DIAGNOSIS — C3491 Malignant neoplasm of unspecified part of right bronchus or lung: Secondary | ICD-10-CM | POA: Diagnosis not present

## 2021-03-29 DIAGNOSIS — Z95828 Presence of other vascular implants and grafts: Secondary | ICD-10-CM

## 2021-03-29 DIAGNOSIS — D6481 Anemia due to antineoplastic chemotherapy: Secondary | ICD-10-CM | POA: Diagnosis not present

## 2021-03-29 LAB — CMP (CANCER CENTER ONLY)
ALT: 15 U/L (ref 0–44)
AST: 23 U/L (ref 15–41)
Albumin: 3.9 g/dL (ref 3.5–5.0)
Alkaline Phosphatase: 79 U/L (ref 38–126)
Anion gap: 9 (ref 5–15)
BUN: 9 mg/dL (ref 8–23)
CO2: 26 mmol/L (ref 22–32)
Calcium: 9.2 mg/dL (ref 8.9–10.3)
Chloride: 108 mmol/L (ref 98–111)
Creatinine: 0.67 mg/dL (ref 0.61–1.24)
GFR, Estimated: >60 mL/min (ref >60)
Glucose, Bld: 104 mg/dL — ABNORMAL HIGH (ref 70–99)
Potassium: 3.9 mmol/L (ref 3.5–5.1)
Sodium: 143 mmol/L (ref 135–145)
Total Bilirubin: 0.3 mg/dL (ref 0.3–1.2)
Total Protein: 6.7 g/dL (ref 6.5–8.1)

## 2021-03-29 LAB — CBC WITH DIFFERENTIAL (CANCER CENTER ONLY)
Abs Immature Granulocytes: 0.01 10*3/uL (ref 0.00–0.07)
Basophils Absolute: 0 10*3/uL (ref 0.0–0.1)
Basophils Relative: 1 %
Eosinophils Absolute: 0.1 10*3/uL (ref 0.0–0.5)
Eosinophils Relative: 1 %
HCT: 32.3 % — ABNORMAL LOW (ref 39.0–52.0)
Hemoglobin: 10.7 g/dL — ABNORMAL LOW (ref 13.0–17.0)
Immature Granulocytes: 0 %
Lymphocytes Relative: 21 %
Lymphs Abs: 0.9 10*3/uL (ref 0.7–4.0)
MCH: 32.9 pg (ref 26.0–34.0)
MCHC: 33.1 g/dL (ref 30.0–36.0)
MCV: 99.4 fL (ref 80.0–100.0)
Monocytes Absolute: 0.8 10*3/uL (ref 0.1–1.0)
Monocytes Relative: 17 %
Neutro Abs: 2.6 10*3/uL (ref 1.7–7.7)
Neutrophils Relative %: 60 %
Platelet Count: 367 10*3/uL (ref 150–400)
RBC: 3.25 MIL/uL — ABNORMAL LOW (ref 4.22–5.81)
RDW: 13.2 % (ref 11.5–15.5)
WBC Count: 4.4 10*3/uL (ref 4.0–10.5)
nRBC: 0 % (ref 0.0–0.2)

## 2021-03-29 LAB — TSH: TSH: 1.559 u[IU]/mL (ref 0.320–4.118)

## 2021-03-29 MED ORDER — PROCHLORPERAZINE MALEATE 10 MG PO TABS
10.0000 mg | ORAL_TABLET | Freq: Once | ORAL | Status: AC
  Administered 2021-03-29: 10 mg via ORAL
  Filled 2021-03-29: qty 1

## 2021-03-29 MED ORDER — SODIUM CHLORIDE 0.9 % IV SOLN
200.0000 mg | Freq: Once | INTRAVENOUS | Status: AC
  Administered 2021-03-29: 200 mg via INTRAVENOUS
  Filled 2021-03-29: qty 8

## 2021-03-29 MED ORDER — SODIUM CHLORIDE 0.9 % IV SOLN
Freq: Once | INTRAVENOUS | Status: AC
Start: 2021-03-29 — End: 2021-03-29

## 2021-03-29 MED ORDER — HEPARIN SOD (PORK) LOCK FLUSH 100 UNIT/ML IV SOLN
500.0000 [IU] | Freq: Once | INTRAVENOUS | Status: AC | PRN
  Administered 2021-03-29: 500 [IU]

## 2021-03-29 MED ORDER — SODIUM CHLORIDE 0.9% FLUSH
10.0000 mL | Freq: Once | INTRAVENOUS | Status: AC
  Administered 2021-03-29: 10 mL

## 2021-03-29 MED ORDER — CYANOCOBALAMIN 1000 MCG/ML IJ SOLN
1000.0000 ug | Freq: Once | INTRAMUSCULAR | Status: AC
  Administered 2021-03-29: 1000 ug via INTRAMUSCULAR
  Filled 2021-03-29: qty 1

## 2021-03-29 MED ORDER — SODIUM CHLORIDE 0.9% FLUSH
10.0000 mL | INTRAVENOUS | Status: DC | PRN
  Administered 2021-03-29: 10 mL

## 2021-03-29 MED ORDER — SODIUM CHLORIDE 0.9 % IV SOLN
500.0000 mg/m2 | Freq: Once | INTRAVENOUS | Status: AC
  Administered 2021-03-29: 900 mg via INTRAVENOUS
  Filled 2021-03-29: qty 20

## 2021-03-29 MED ORDER — DEXAMETHASONE 4 MG PO TABS: 4.0000 mg | ORAL_TABLET | Freq: Every day | ORAL | 0 refills | Status: DC

## 2021-03-29 NOTE — Telephone Encounter (Signed)
I tried to reach Benjamin Willis on both numbers on file. The mobile number did not have a voicemail set up. I left a voicemail on the home number.   I notified the patient that I sent a prescription for decadron 4 mg once daily per the recommendation of Dr. Mickeal Skinner due to new onset of left leg weakness.  Additionally, Dr. Renda Rolls team will be in contact next week to schedule a follow up.   I left the cancer center's number in case patient has any questions or concerns.

## 2021-03-29 NOTE — Patient Instructions (Signed)
[facility]  Discharge Instructions: Thank you for choosing [facility] to provide your oncology and hematology care.   If you have a lab appointment with the [facility], please go directly to the [facility] and check in at the registration area.   Wear comfortable clothing and clothing appropriate for easy access to any Portacath or PICC line.   We strive to give you quality time with your provider. You may need to reschedule your appointment if you arrive late (15 or more minutes).  Arriving late affects you and other patients whose appointments are after yours.  Also, if you miss three or more appointments without notifying the office, you may be dismissed from the clinic at the provider's discretion.      For prescription refill requests, have your pharmacy contact our office and allow 72 hours for refills to be completed.    Today you received the following chemotherapy and/or immunotherapy agents: Alimta, Keytruda.   To help prevent nausea and vomiting after your treatment, we encourage you to take your nausea medication as directed.  BELOW ARE SYMPTOMS THAT SHOULD BE REPORTED IMMEDIATELY: *FEVER GREATER THAN 100.4 F (38 C) OR HIGHER *CHILLS OR SWEATING *NAUSEA AND VOMITING THAT IS NOT CONTROLLED WITH YOUR NAUSEA MEDICATION *UNUSUAL SHORTNESS OF BREATH *UNUSUAL BRUISING OR BLEEDING *URINARY PROBLEMS (pain or burning when urinating, or frequent urination) *BOWEL PROBLEMS (unusual diarrhea, constipation, pain near the anus) TENDERNESS IN MOUTH AND THROAT WITH OR WITHOUT PRESENCE OF ULCERS (sore throat, sores in mouth, or a toothache) UNUSUAL RASH, SWELLING OR PAIN  UNUSUAL VAGINAL DISCHARGE OR ITCHING   Items with * indicate a potential emergency and should be followed up as soon as possible or go to the Emergency Department if any problems should occur.  Please show the CHEMOTHERAPY ALERT CARD or IMMUNOTHERAPY ALERT CARD at  check-in to the Emergency Department and triage nurse.  Should you have questions after your visit or need to cancel or reschedule your appointment, please contact [facility]  Dept: [phone]  and follow the prompts.  Office hours are 8:00 a.m. to 4:30 p.m. Monday - Friday. Please note that voicemails left after 4:00 p.m. may not be returned until the following business day.  We are closed weekends and major holidays. You have access to a nurse at all times for urgent questions. Please call the main number to the clinic Dept: [phone] and follow the prompts.   For any non-urgent questions, you may also contact your provider using MyChart. We now offer e-Visits for anyone 77 and older to request care online for non-urgent symptoms. For details visit [email or web address].   Also download the MyChart app! Go to the app store, search "MyChart", open the app, select [facility], and log in with your MyChart username and password.  Due to Covid, a mask is required upon entering the hospital/clinic. If you do not have a mask, one will be given to you upon arrival. For doctor visits, patients may have 1 support person aged 44 or older with them. For treatment visits, patients cannot have anyone with them due to current Covid guidelines and our immunocompromised population.

## 2021-04-01 ENCOUNTER — Encounter: Payer: Self-pay | Admitting: Hematology and Oncology

## 2021-04-01 DIAGNOSIS — C3490 Malignant neoplasm of unspecified part of unspecified bronchus or lung: Secondary | ICD-10-CM | POA: Insufficient documentation

## 2021-04-01 NOTE — Progress Notes (Signed)
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  This was followed by an MRI of the brain with and without contrast which showed 4 brain lesions consistent with metastases, extensive vasogenic edema associated with the posterior right frontal lesion without midline shift. 6/15-6/28/2022: 10 fractions of WBRT with Rad/Onc 11/23/2020: Guardant 360 returns with no targetable mutation.  12/14/2020: Cycle 1 Day 1 of Carbo/Pem/Pem 01/04/2021: Cycle 2 Day 1 of Carbo/Pem/Pem 01/25/2021: Cycle 3 Day 1 of Carbo/Pem/Pem 02/15/2021: Cycle 4 Day 1 of Carbo/Pem/Pem 03/08/2021: Cycle 5 Day 1 of maintenance Pem/Pem 03/29/2021: Cycle 6 Day 1 of maintenance Pem/Pem  Interval History:  Benjamin Willis. 75 y.o. 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 The patient's last visit was on 11/042022.  In the interim since the last visit he has completed Cycle 5 of maintenance chemotherapy. He presents today for Cycle 6 Day 1.   On exam today Mrs. Hawe is unaccompanied. He reports noticing left leg weakness for the past week. He reports that the weakness if affecting his ability to ambulate. He is currently in a wheelchair during the visit. He denies numbness in the left leg, loss of bowel control or bladder  dysfunction.  He reports that his energy and appetite are unchanged. He denies nausea, vomiting or abdominal pain. His bowel habits are unchanged with diarrhea or constipation. He reports that his shortness of breath has improved.  He currently denies any fevers, chills, night sweats, chest pain or cough.  Full 10 point ROS is listed below.  MEDICAL HISTORY:  Past Medical History:  Diagnosis Date   Allergy    Diabetes mellitus without complication ([place])    Type II   Glaucoma 10/16/2020   History of radiation therapy    [place]- SBRT 02/21/19-02/28/19, Rt Lung- IMRT 02/29/20-04/16/20 Dr. Gery Pray   History of radiation therapy 10/30/2020   whole brain 10/17/2020-10/30/2020 Dr Gery Pray   Hyperlipidemia    Hypertension    Lung cancer ([place])    Stage IA2 (cT1b, N0) non-small cell lung cancer (adenocarcinoma)    SURGICAL HISTORY: Past Surgical History:  Procedure Laterality Date   ELECTROMAGNETIC NAVIGATION BROCHOSCOPY Right 02/02/2019   Procedure: VIDEO BRONCHOSCOPY WITH NAVIGATION AND FIDUCIAL PLACEMENT;  Surgeon: Garner Nash, DO;  Location: [facility];  Service: Cardiopulmonary;  Laterality: Right;   FUDUCIAL PLACEMENT Right 02/02/2019   Procedure: Placement Of Fuducial Right Upper Lobe;  Surgeon: Garner Nash, DO;  Location: [facility];  Service: Cardiopulmonary;  Laterality: Right;   IR IMAGING GUIDED PORT INSERTION  12/13/2020   none     VIDEO BRONCHOSCOPY WITH RADIAL ENDOBRONCHIAL ULTRASOUND Right 02/02/2019   Procedure: Video Bronchoscopy With Radial Endobronchial Ultrasound;  Surgeon: Garner Nash, DO;  Location: [place];  Service: Cardiopulmonary;  Laterality: Right;    SOCIAL HISTORY: Social History   Socioeconomic History   Marital status: Single    Spouse  name: Not on file   Number of children: 0   Years of education: Not on file   Highest education level: 9th grade  Occupational History   Not on file  Tobacco Use   Smoking status: Former    Packs/day: 1.00     Years: 48.00    Pack years: 48.00    Types: Cigarettes    Quit date: 02/20/2015    Years since quitting: 6.1   Smokeless tobacco: Former    Quit date: 10/2013  Vaping Use   Vaping Use: Never used  Substance and Sexual Activity   Alcohol use: No   Drug use: No   Sexual activity: Yes    Birth control/protection: Condom  Other Topics Concern   Not on file  Social History Narrative   Not on file   Social Determinants of Health   Financial Resource Strain: Not on file  Food Insecurity: Not on file  Transportation Needs: Not on file  Physical Activity: Not on file  Stress: Not on file  Social Connections: Not on file  Intimate Partner Violence: Not on file    FAMILY HISTORY: Family History  Problem Relation Age of Onset   Lung cancer Neg Hx     ALLERGIES:  has No Known Allergies.  MEDICATIONS:  Current Outpatient Medications  Medication Sig Dispense Refill   dexamethasone (DECADRON) 4 MG tablet Take 1 tablet (4 mg total) by mouth daily. 14 tablet 0   acetaminophen (TYLENOL) 500 MG tablet Take 1,000 mg by mouth every 6 (six) hours as needed for mild pain.     albuterol (VENTOLIN HFA) 108 (90 Base) MCG/ACT inhaler Inhale 2 puffs into the lungs every 6 (six) hours as needed for wheezing or shortness of breath. 18 g 6   amLODipine (NORVASC) 10 MG tablet Take 1 tablet by mouth once daily 90 tablet 0   ammonium lactate (AMLACTIN) 12 % lotion Apply 1 application topically as needed for dry skin. 400 g 0   blood glucose meter kit and supplies KIT Dispense based on patient and insurance preference. Use up to four times daily as directed. 1 each 0   Blood Pressure Monitoring (BLOOD PRESSURE MONITOR/ARM) DEVI Check BP once a day. DX I 10 1 each 0   cetirizine (ZYRTEC) 10 MG tablet Take 1 tablet (10 mg total) by mouth daily. (Patient taking differently: Take 10 mg by mouth daily as needed for allergies.) 30 tablet 11   erythromycin ophthalmic ointment Place 1 application into the left  eye at bedtime. 3.5 g 0   folic acid (FOLVITE) 1 MG tablet Take 1 tablet (1 mg total) by mouth daily. 90 tablet 1   furosemide (LASIX) 20 MG tablet Take 20 mg by mouth daily.     gabapentin (NEURONTIN) 300 MG capsule Take 1 capsule (300 mg total) by mouth at bedtime. 90 capsule 1   glipiZIDE (GLUCOTROL) 5 MG tablet TAKE 1 TABLET BY MOUTH TWICE DAILY WITH A MEAL (Patient taking differently: Take 5 mg by mouth 2 (two) times daily before a meal.) 180 tablet 0   latanoprost (XALATAN) 0.005 % ophthalmic solution Place 1 drop into both eyes at bedtime. 2.5 mL 3   levETIRAcetam (KEPPRA) 1000 MG tablet Take 1 tablet (1,000 mg total) by mouth 2 (two) times daily. 180 tablet 1   lidocaine-prilocaine (EMLA) cream Apply 1 application topically as needed. (Patient taking differently: Apply 1 application topically as needed (for port before tx).) 30 g 0   lisinopril (ZESTRIL) 40  MG tablet Take 1 tablet (40 mg total) by mouth daily. 90 tablet 3   metFORMIN (GLUCOPHAGE) 500 MG tablet TAKE 1 TABLET BY MOUTH TWICE DAILY WITH MEALS (Patient taking differently: Take 500 mg by mouth daily with breakfast.) 180 tablet 0   Multiple Vitamins-Minerals (MULTIVITAMIN MEN) TABS Take 1 tablet by mouth daily.     ondansetron (ZOFRAN) 8 MG tablet Take 1 tablet (8 mg total) by mouth every 8 (eight) hours as needed for nausea or vomiting. 30 tablet 0   potassium chloride SA (KLOR-CON) 20 MEQ tablet Take 20 mEq by mouth daily.     prochlorperazine (COMPAZINE) 10 MG tablet Take 1 tablet (10 mg total) by mouth every 6 (six) hours as needed for nausea or vomiting. 30 tablet 0   rosuvastatin (CRESTOR) 20 MG tablet Take by mouth.     sitaGLIPtin (JANUVIA) 100 MG tablet Take 1 tablet (100 mg total) by mouth daily. 90 tablet 3   No current facility-administered medications for this visit.    REVIEW OF SYSTEMS:   Constitutional: ( - ) fevers, ( - )  chills , ( - ) night sweats Eyes: ( - ) blurriness of vision, ( - ) double vision, ( - )  watery eyes Ears, nose, mouth, throat, and face: ( - ) mucositis, ( - ) sore throat Respiratory: ( - ) cough, ( - ) dyspnea, ( - ) wheezes Cardiovascular: ( - ) palpitation, ( - ) chest discomfort, ( - ) lower extremity swelling Gastrointestinal:  ( - ) nausea, ( - ) heartburn, ( - ) change in bowel habits Skin: ( - ) abnormal skin rashes Lymphatics: ( - ) new lymphadenopathy, ( - ) easy bruising Neurological: ( - ) numbness, ( - ) tingling, ( - ) new weaknesses Behavioral/Psych: ( - ) mood change, ( - ) new changes  All other systems were reviewed with the patient and are negative.  PHYSICAL EXAMINATION:  Vitals:   03/29/21 1402  BP: (!) 155/89  Pulse: 74  Resp: 17  Temp: 98.4 F (36.9 C)  SpO2: 100%    Filed Weights   03/29/21 1402  Weight: 151 lb 1.6 oz (68.5 kg)     GENERAL: Well-appearing elderly African-American male, alert, no distress and comfortable SKIN:  Skin color, texture, turgor are normal, no rashes or significant lesions EYES: conjunctiva are pink and non-injected, sclera clear LUNGS: clear to auscultation and percussion with normal breathing effort HEART: regular rate & rhythm and no murmurs and no lower extremity edema PSYCH: alert & oriented x 3, fluent speech NEURO: decreased power and movement in the left leg. No facial droop.   LABORATORY DATA:  I have reviewed the data as listed CBC Latest Ref Rng & Units 03/29/2021 03/08/2021 02/15/2021  WBC 4.0 - 10.5 K/uL 4.4 2.6(L) 3.1(L)  Hemoglobin 13.0 - 17.0 g/dL 10.7(L) 9.8(L) 9.6(L)  Hematocrit 39.0 - 52.0 % 32.3(L) 29.4(L) 29.9(L)  Platelets 150 - 400 K/uL 367 221 252    CMP Latest Ref Rng & Units 03/29/2021 03/08/2021 02/15/2021  Glucose 70 - 99 mg/dL 104(H) 189(H) 206(H)  BUN 8 - 23 mg/dL $Remove'9 10 10  'EvuISTg$ Creatinine 0.61 - 1.24 mg/dL 0.67 0.71 0.63  Sodium 135 - 145 mmol/L 143 142 138  Potassium 3.5 - 5.1 mmol/L 3.9 3.8 3.8  Chloride 98 - 111 mmol/L 108 107 104  CO2 22 - 32 mmol/L $RemoveB'26 27 27  'yIwmMFBL$ Calcium 8.9 -  10.3 mg/dL 9.2 8.8(L) 8.8(L)  Total Protein 6.5 -  8.1 g/dL 6.7 6.4(L) 6.7  Total Bilirubin 0.3 - 1.2 mg/dL 0.3 0.3 0.4  Alkaline Phos 38 - 126 U/L 79 76 63  AST 15 - 41 U/L $Remo'23 17 21  'SKpIs$ ALT 0 - 44 U/L $Remo'15 10 13    'kPAAF$ RADIOGRAPHIC STUDIES:  CT CHEST ABDOMEN PELVIS W CONTRAST  Result Date: 03/05/2021 CLINICAL DATA:  Follow-up metastatic non-small cell lung carcinoma. Undergoing chemotherapy. Previous radiation therapy. EXAM: CT CHEST, ABDOMEN, AND PELVIS WITH CONTRAST TECHNIQUE: Multidetector CT imaging of the chest, abdomen and pelvis was performed following the standard protocol during bolus administration of intravenous contrast. CONTRAST:  96mL OMNIPAQUE IOHEXOL 350 MG/ML SOLN COMPARISON:  10/16/2020 FINDINGS: CT CHEST FINDINGS Cardiovascular: No acute findings. Aortic atherosclerotic calcification noted. Mediastinum/Lymph Nodes: No masses or pathologically enlarged lymph nodes identified. Stable mild goiter, without dominant nodule. Lungs/Pleura: Stable ill-defined opacity in the central right upper lobe with associated mild-to-moderate central bronchiectasis, most consistent with post radiation changes. No suspicious pulmonary nodules or masses identified. No evidence of infiltrate or pleural effusion. Musculoskeletal:  No suspicious bone lesions identified. CT ABDOMEN AND PELVIS FINDINGS Hepatobiliary: No masses identified. Stable tiny sub-cm left hepatic lobe cysts again noted. Gallbladder is unremarkable. No evidence of biliary ductal dilatation. Pancreas:  No mass or inflammatory changes. Spleen:  Within normal limits in size and appearance. Adrenals/Urinary tract: Stable 13 mm right adrenal nodule. No renal masses or hydronephrosis. Unremarkable unopacified urinary bladder. Stomach/Bowel: No evidence of obstruction, inflammatory process, or abnormal fluid collections. Large stool burden seen throughout the colon. Vascular/Lymphatic: No pathologically enlarged lymph nodes identified. No acute vascular  findings. Aortic atherosclerotic calcification noted. Reproductive:  No mass or other significant abnormality identified. Other:  A tiny umbilical hernia is noted which contains only fat. Musculoskeletal:  No suspicious bone lesions identified. IMPRESSION: Stable post radiation changes in central right upper lobe. Stable 13 mm right adrenal nodule. No new or progressive disease within the chest, abdomen, or pelvis. Large stool burden noted; recommend clinical correlation for possible constipation. Aortic Atherosclerosis (ICD10-I70.0). Electronically Signed   By: Marlaine Hind M.D.   On: 03/05/2021 12:47    ASSESSMENT & PLAN Benett Florina Ou. 75 y.o. 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  After review the labs, reviewed records, discussion with the patient the findings are most consistent with metastatic adenocarcinoma with spread to the brain.  Unfortunately we do not have new pathology as the lesions in his brain were not resectable and he underwent radiation therapy instead.  We can presume based off his prior pathology that this represents the original adenocarcinoma.  As such I do believe it is reasonable to proceed with carboplatin, pembrolizumab, pemetrexed therapy in order to help treat any possible micrometastatic disease.  The patient also underwent whole brain radiation therapy instead of SRS.  Therefore I do believe systemic therapy will be reasonable.  This plan of care was discussed with the resident lung expert Dr. Julien Nordmann. Additionally our [place] 360 testing was negative for a targetable mutation.   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 Lost to follow up --presents with brain metastasis only. No evidence of disease in the C/A/P noted on CT scan from 03/05/2021.   --Patient completed whole brain radiation, but did not wish to undergo SRS.  Radiation oncology noted that this may be an option  in the future if he were to have recurrence in the brain --Guardant 360 on 11/01/2020 showed no targetable mutations.  Plan: --Labs today were reviewed. No intervention required and adequate to proceed with treatment.  --plan to proceed with maintenance Pem/Pem chemotherapy, Cycle 6 Day 1 today. --Plan for CT in 3 months (Feb 2023) --continue to follow with neuro-oncology for brain metastasis --RTC in 3 weeks for Cycle 7 of chemotherapy with maintenance pembrolizumab/pemetrexed.    #Left leg weakness --Most recent MRI brain from 02/05/2021 showed regression of all four brain metastases without any new lesions identified.  --Sent prescription of dexamethasone 4 mg once daily per recommendation of Dr. Mauri Reading. He will see patient in clinic next week to further evaluate.   #Chemotherapy Induced Anemia #Chemotherapy Induced Leukopenia --today WBC 4.4 and Hgb 10.7. ANC 2.6. improved from prior --continue to monitor   #Supportive Care -- chemotherapy education complete  -- port placed --daily PO $RemoveBe'1mg'StyGYLmwX$  folic acid with pemetrexed. -- zofran $RemoveB'8mg'kDGsiKgl$  q8H PRN and compazine $RemoveBefor'10mg'XkHwXfxZfYbQ$  PO q6H for nausea -- EMLA cream for port -- no pain medication required at this time.   No orders of the defined types were placed in this encounter.   All questions were answered. The patient knows to call the clinic with any problems, questions or concerns.  I have spent a total of 30 minutes minutes of face-to-face and non-face-to-face time, preparing to see the patient, performing a medically appropriate examination, counseling and educating the patient, ordering medications, referring and communicating with other health care professionals, documenting clinical information in the electronic health record, and care coordination.   Lincoln Brigham, PA-C Department of Hematology/Oncology [facility] Phone: [phone]   04/01/2021 2:47 PM

## 2021-04-02 ENCOUNTER — Telehealth: Payer: Self-pay | Admitting: Internal Medicine

## 2021-04-02 NOTE — Telephone Encounter (Signed)
Sch per 11/28, left msg

## 2021-04-03 ENCOUNTER — Telehealth: Payer: Self-pay | Admitting: Internal Medicine

## 2021-04-03 DIAGNOSIS — E119 Type 2 diabetes mellitus without complications: Secondary | ICD-10-CM | POA: Diagnosis not present

## 2021-04-03 DIAGNOSIS — H40013 Open angle with borderline findings, low risk, bilateral: Secondary | ICD-10-CM | POA: Diagnosis not present

## 2021-04-03 DIAGNOSIS — H25813 Combined forms of age-related cataract, bilateral: Secondary | ICD-10-CM | POA: Diagnosis not present

## 2021-04-03 NOTE — Telephone Encounter (Signed)
Sch per 11/29 , pt sister aware

## 2021-04-04 ENCOUNTER — Telehealth: Payer: Self-pay | Admitting: Podiatry

## 2021-04-04 ENCOUNTER — Inpatient Hospital Stay: Payer: Medicare Other | Admitting: Internal Medicine

## 2021-04-04 NOTE — Telephone Encounter (Signed)
Pts caregiver called and left message yesterday asking about status pf pts diabetic shoes.  I returned call today and explained that the shoes were on back order but they should be shipping within the next week or two and I will call when they come in.

## 2021-04-09 ENCOUNTER — Other Ambulatory Visit: Payer: Self-pay

## 2021-04-09 ENCOUNTER — Inpatient Hospital Stay: Payer: Medicare Other | Attending: Hematology and Oncology | Admitting: Internal Medicine

## 2021-04-09 VITALS — BP 140/83 | HR 69 | Temp 97.5°F | Resp 18 | Ht 66.0 in | Wt 154.0 lb

## 2021-04-09 DIAGNOSIS — Z5112 Encounter for antineoplastic immunotherapy: Secondary | ICD-10-CM | POA: Diagnosis not present

## 2021-04-09 DIAGNOSIS — C7931 Secondary malignant neoplasm of brain: Secondary | ICD-10-CM | POA: Insufficient documentation

## 2021-04-09 DIAGNOSIS — R200 Anesthesia of skin: Secondary | ICD-10-CM | POA: Diagnosis not present

## 2021-04-09 DIAGNOSIS — C3411 Malignant neoplasm of upper lobe, right bronchus or lung: Secondary | ICD-10-CM | POA: Insufficient documentation

## 2021-04-09 DIAGNOSIS — Z79899 Other long term (current) drug therapy: Secondary | ICD-10-CM | POA: Diagnosis not present

## 2021-04-09 DIAGNOSIS — Z5111 Encounter for antineoplastic chemotherapy: Secondary | ICD-10-CM | POA: Insufficient documentation

## 2021-04-09 DIAGNOSIS — D701 Agranulocytosis secondary to cancer chemotherapy: Secondary | ICD-10-CM | POA: Insufficient documentation

## 2021-04-09 DIAGNOSIS — D6481 Anemia due to antineoplastic chemotherapy: Secondary | ICD-10-CM | POA: Insufficient documentation

## 2021-04-09 MED ORDER — DEXAMETHASONE 2 MG PO TABS: 2.0000 mg | ORAL_TABLET | Freq: Every day | ORAL | 1 refills | Status: DC

## 2021-04-09 NOTE — Progress Notes (Signed)
[name] [place], [place] [address] [phone]   Interval Evaluation  Date of Service: 04/09/21 Patient Name: Benjamin Willis. Patient MRN: [id number] Patient DOB: [birth year] Provider: Ventura Sellers, MD  Identifying Statement:  Benjamin Willis. is a 75 y.o. male with Brain metastases [facility])   Primary Cancer:  Oncologic History: Oncology History  Primary cancer of right upper lobe of lung ([place])  02/07/2019 Initial Diagnosis   Primary cancer of right upper lobe of lung ([place])   11/08/2020 Cancer Staging   Staging form: Lung, AJCC 8th Edition - Clinical: Stage IV (cT0, cN0, cM1) - Signed by Orson Slick, MD on 11/08/2020    Metastatic lung cancer (metastasis from lung to other site) [facility])  10/16/2020 Initial Diagnosis   Metastatic lung cancer (metastasis from lung to other site) [facility])   11/01/2020 Cancer Staging   Staging form: Lung, AJCC 8th Edition - Clinical stage from 11/01/2020: Stage IVB (cT0, cN0, cM1c) - Signed by Orson Slick, MD on 11/01/2020 Stage prefix: Initial diagnosis    12/14/2020 -  Chemotherapy   Patient is on Treatment Plan : LUNG CARBOplatin / Pemetrexed / Pembrolizumab q21d Induction x 4 cycles / Maintenance Pemetrexed + Pembrolizumab      CNS Oncologic History 10/30/20: Completes WBRT for 4 metastases ([place])  Interval History: [name]. presents today for follow up after recent clinical changes.  He describes new onset left leg weakness, starting several weeks ago, progressing to significant impairment with walking.  He was started on decadron $RemoveBef'4mg'GWCCtzHlnk$  daily on 03/29/21; following steroid dosing his leg weakness persists, but it is clearly improved from its "low point" 2 weeks ago.  He is now walking around the home with cane assist only.  Denies seizures or headaches.  Continues on [place] and alimta with Dr. Lorenso Courier.     H+P (11/06/20) [name]. presented to neurologic  attention on June 13th, 2022 with complaints of left sided weakness.  He describes new difficulty with walking and using his left hand for daily tasks.  He did not have any falls, and remained independent (though Onset of symptoms was 1-2 weeks prior.  His lung cancer, previously early stage, had been treated with radiation in both 2020 and for recurrence in 2021.  He is scheduled to consult with Dr. Lorenso Courier for further management of systemic disease.    Medications: Current Outpatient Medications on File Prior to Visit  Medication Sig Dispense Refill   acetaminophen (TYLENOL) 500 MG tablet Take 1,000 mg by mouth every 6 (six) hours as needed for mild pain.     albuterol (VENTOLIN HFA) 108 (90 Base) MCG/ACT inhaler Inhale 2 puffs into the lungs every 6 (six) hours as needed for wheezing or shortness of breath. 18 g 6   amLODipine (NORVASC) 10 MG tablet Take 1 tablet by mouth once daily 90 tablet 0   ammonium lactate (AMLACTIN) 12 % lotion Apply 1 application topically as needed for dry skin. 400 g 0   blood glucose meter kit and supplies KIT Dispense based on patient and insurance preference. Use up to four times daily as directed. 1 each 0   Blood Pressure Monitoring (BLOOD PRESSURE MONITOR/ARM) DEVI Check BP once a day. DX I 10 1 each 0   cetirizine (ZYRTEC) 10 MG tablet Take 1 tablet (10 mg total) by mouth daily. (Patient taking differently: Take 10 mg by mouth daily as needed for allergies.) 30  tablet 11   dexamethasone (DECADRON) 4 MG tablet Take 1 tablet (4 mg total) by mouth daily. 14 tablet 0   erythromycin ophthalmic ointment Place 1 application into the left eye at bedtime. 3.5 g 0   folic acid (FOLVITE) 1 MG tablet Take 1 tablet (1 mg total) by mouth daily. 90 tablet 1   furosemide (LASIX) 20 MG tablet Take 20 mg by mouth daily.     gabapentin (NEURONTIN) 300 MG capsule Take 1 capsule (300 mg total) by mouth at bedtime. 90 capsule 1   glipiZIDE (GLUCOTROL) 5 MG tablet TAKE 1 TABLET BY  MOUTH TWICE DAILY WITH A MEAL (Patient taking differently: Take 5 mg by mouth 2 (two) times daily before a meal.) 180 tablet 0   latanoprost (XALATAN) 0.005 % ophthalmic solution Place 1 drop into both eyes at bedtime. 2.5 mL 3   levETIRAcetam (KEPPRA) 1000 MG tablet Take 1 tablet (1,000 mg total) by mouth 2 (two) times daily. 180 tablet 1   lidocaine-prilocaine (EMLA) cream Apply 1 application topically as needed. (Patient taking differently: Apply 1 application topically as needed (for port before tx).) 30 g 0   lisinopril (ZESTRIL) 40 MG tablet Take 1 tablet (40 mg total) by mouth daily. 90 tablet 3   metFORMIN (GLUCOPHAGE) 500 MG tablet TAKE 1 TABLET BY MOUTH TWICE DAILY WITH MEALS (Patient taking differently: Take 500 mg by mouth daily with breakfast.) 180 tablet 0   Multiple Vitamins-Minerals (MULTIVITAMIN MEN) TABS Take 1 tablet by mouth daily.     ondansetron (ZOFRAN) 8 MG tablet Take 1 tablet (8 mg total) by mouth every 8 (eight) hours as needed for nausea or vomiting. 30 tablet 0   potassium chloride SA (KLOR-CON) 20 MEQ tablet Take 20 mEq by mouth daily.     prochlorperazine (COMPAZINE) 10 MG tablet Take 1 tablet (10 mg total) by mouth every 6 (six) hours as needed for nausea or vomiting. 30 tablet 0   rosuvastatin (CRESTOR) 20 MG tablet Take by mouth.     sitaGLIPtin (JANUVIA) 100 MG tablet Take 1 tablet (100 mg total) by mouth daily. 90 tablet 3   No current facility-administered medications on file prior to visit.    Allergies: No Known Allergies Past Medical History:  Past Medical History:  Diagnosis Date   Allergy    Diabetes mellitus without complication (HCC)    Type II   Glaucoma 10/16/2020   History of radiation therapy    [place]- SBRT 02/21/19-02/28/19, Rt Lung- IMRT 02/29/20-04/16/20 Dr. Antony Blackbird   History of radiation therapy 10/30/2020   whole brain 10/17/2020-10/30/2020 Dr Antony Blackbird   Hyperlipidemia    Hypertension    Lung cancer (HCC)    Stage IA2 (cT1b,  N0) non-small cell lung cancer (adenocarcinoma)   Past Surgical History:  Past Surgical History:  Procedure Laterality Date   ELECTROMAGNETIC NAVIGATION BROCHOSCOPY Right 02/02/2019   Procedure: VIDEO BRONCHOSCOPY WITH NAVIGATION AND FIDUCIAL PLACEMENT;  Surgeon: Josephine Igo, DO;  Location: [facility] OR;  Service: Cardiopulmonary;  Laterality: Right;   FUDUCIAL PLACEMENT Right 02/02/2019   Procedure: Placement Of Fuducial Right Upper Lobe;  Surgeon: Josephine Igo, DO;  Location: [facility] OR;  Service: Cardiopulmonary;  Laterality: Right;   IR IMAGING GUIDED PORT INSERTION  12/13/2020   none     VIDEO BRONCHOSCOPY WITH RADIAL ENDOBRONCHIAL ULTRASOUND Right 02/02/2019   Procedure: Video Bronchoscopy With Radial Endobronchial Ultrasound;  Surgeon: Josephine Igo, DO;  Location: [facility] OR;  Service: Cardiopulmonary;  Laterality: Right;  Social History:  Social History   Socioeconomic History   Marital status: Single    Spouse name: Not on file   Number of children: 0   Years of education: Not on file   Highest education level: 9th grade  Occupational History   Not on file  Tobacco Use   Smoking status: Former    Packs/day: 1.00    Years: 48.00    Pack years: 48.00    Types: Cigarettes    Quit date: 02/20/2015    Years since quitting: 6.1   Smokeless tobacco: Former    Quit date: 10/2013  Vaping Use   Vaping Use: Never used  Substance and Sexual Activity   Alcohol use: No   Drug use: No   Sexual activity: Yes    Birth control/protection: Condom  Other Topics Concern   Not on file  Social History Narrative   Not on file   Social Determinants of Health   Financial Resource Strain: Not on file  Food Insecurity: Not on file  Transportation Needs: Not on file  Physical Activity: Not on file  Stress: Not on file  Social Connections: Not on file  Intimate Partner Violence: Not on file   Family History:  Family History  Problem Relation Age of Onset   Lung cancer Neg Hx      Review of Systems: Constitutional: Doesn't report fevers, chills or abnormal weight loss Eyes: Doesn't report blurriness of vision Ears, nose, mouth, throat, and face: Doesn't report sore throat Respiratory: Doesn't report cough, dyspnea or wheezes Cardiovascular: Doesn't report palpitation, chest discomfort  Gastrointestinal:  Doesn't report nausea, constipation, diarrhea GU: Doesn't report incontinence Skin: Doesn't report skin rashes Neurological: Per HPI Musculoskeletal: Doesn't report joint pain Behavioral/Psych: Doesn't report anxiety  Physical Exam: Vitals:   04/09/21 1407  BP: 140/83  Pulse: 69  Resp: 18  Temp: (!) 97.5 F (36.4 C)  SpO2: 100%    KPS: 90. General: Alert, cooperative, pleasant, in no acute distress Head: Normal EENT: No conjunctival injection or scleral icterus.  Lungs: Resp effort normal Cardiac: Regular rate Abdomen: Non-distended abdomen Skin: No rashes cyanosis or petechiae. Extremities: No clubbing or edema  Neurologic Exam: Mental Status: Awake, alert, attentive to examiner. Oriented to self and environment. Language is fluent with intact comprehension.  Cranial Nerves: Visual acuity is grossly normal. Visual fields are full. Extra-ocular movements intact. No ptosis. Face is symmetric Motor: Tone and bulk are normal. Left leg 4/5. Reflexes are symmetric, no pathologic reflexes present.  Sensory: Intact to light touch Gait: Hemiparetic   Labs: I have reviewed the data as listed    Component Value Date/Time   NA 143 03/29/2021 1350   NA 143 12/06/2019 0949   K 3.9 03/29/2021 1350   CL 108 03/29/2021 1350   CO2 26 03/29/2021 1350   GLUCOSE 104 (H) 03/29/2021 1350   BUN 9 03/29/2021 1350   BUN 16 12/06/2019 0949   CREATININE 0.67 03/29/2021 1350   CALCIUM 9.2 03/29/2021 1350   PROT 6.7 03/29/2021 1350   PROT 6.4 12/06/2019 0949   ALBUMIN 3.9 03/29/2021 1350   ALBUMIN 4.1 12/06/2019 0949   AST 23 03/29/2021 1350   ALT 15  03/29/2021 1350   ALKPHOS 79 03/29/2021 1350   BILITOT 0.3 03/29/2021 1350   GFRNONAA >60 03/29/2021 1350   GFRAA 101 12/06/2019 0949   Lab Results  Component Value Date   WBC 4.4 03/29/2021   NEUTROABS 2.6 03/29/2021   HGB 10.7 (L) 03/29/2021  HCT 32.3 (L) 03/29/2021   MCV 99.4 03/29/2021   PLT 367 03/29/2021     Assessment/Plan Brain metastases ([place])  Glennis Florina Ou. is clinically improved today with regards to left sided weakness, now 12 days in to corticosteroid therapy.    No need for more urgent MRI brain based on improvement.  Can decrease deadron to $RemoveBe'2mg'fmpQxYtaN$  daily if tolerated.  We ask that [place]. return to clinic in 1 months following next brain MRI, or sooner as needed.  We appreciate the opportunity to participate in the care of [place].  All questions were answered. The patient knows to call the clinic with any problems, questions or concerns. No barriers to learning were detected.  The total time spent in the encounter was 30 minutes and more than 50% was on counseling and review of test results   Ventura Sellers, MD Medical Director of Neuro-Oncology [facility] at [place] 04/09/21 2:03 PM

## 2021-04-10 ENCOUNTER — Telehealth: Payer: Self-pay | Admitting: Internal Medicine

## 2021-04-10 ENCOUNTER — Ambulatory Visit: Payer: Medicare Other

## 2021-04-10 DIAGNOSIS — M2011 Hallux valgus (acquired), right foot: Secondary | ICD-10-CM

## 2021-04-10 DIAGNOSIS — E119 Type 2 diabetes mellitus without complications: Secondary | ICD-10-CM

## 2021-04-10 DIAGNOSIS — M2012 Hallux valgus (acquired), left foot: Secondary | ICD-10-CM

## 2021-04-10 NOTE — Telephone Encounter (Signed)
Scheduled per 12/6 los, message has been left with pt

## 2021-04-10 NOTE — Progress Notes (Signed)
SITUATION Reason for Visit: Fitting of Diabetic Shoes & Insoles Patient / Caregiver Report:  Patient is very satisfied with fit and function  OBJECTIVE DATA: Patient History / Diagnosis:  Diabetes mellitus type 2 in nonobese ([place])  Change in Status:   None  ACTIONS PERFORMED: In-Person Delivery, patient was fit with: - 1x pair [id number] PDAC approved prefabricated Diabetic Shoes: Apex V950M 9.5XW - 3x pair [id number] PDAC approved CAM milled custom diabetic insoles  Shoes and insoles were verified for structural integrity and safety. Patient wore shoes and insoles in office. Skin was inspected and free of areas of concern after wearing shoes and inserts. Shoes and inserts fit properly. Patient / Caregiver provided with ferbal instruction and demonstration regarding donning, doffing, wear, care, proper fit, function, purpose, cleaning, and use of shoes and insoles ' and in all related precautions and risks and benefits regarding shoes and insoles. Patient / Caregiver was instructed to wear properly fitting socks with shoes at all times. Patient was also provided with verbal instruction regarding how to report any failures or malfunctions of shoes or inserts, and necessary follow up care. Patient / Caregiver was also instructed to contact physician regarding change in status that may affect function of shoes and inserts.   Patient / Caregiver verbalized undersatnding of instruction provided. Patient / Caregiver demonstrated independence with proper donning and doffing of shoes and inserts.  PLAN Patient to follow up as needed. Plan of care was discussed with and agreed upon by patient and/or caregiver. All questions were answered and concerns addressed.

## 2021-04-15 ENCOUNTER — Ambulatory Visit: Payer: Medicare Other | Admitting: Internal Medicine

## 2021-04-19 ENCOUNTER — Inpatient Hospital Stay (HOSPITAL_BASED_OUTPATIENT_CLINIC_OR_DEPARTMENT_OTHER): Payer: Medicare Other | Admitting: Physician Assistant

## 2021-04-19 ENCOUNTER — Other Ambulatory Visit: Payer: Self-pay | Admitting: Hematology and Oncology

## 2021-04-19 ENCOUNTER — Inpatient Hospital Stay: Payer: Medicare Other

## 2021-04-19 ENCOUNTER — Other Ambulatory Visit: Payer: Self-pay

## 2021-04-19 VITALS — BP 140/73 | HR 72 | Temp 98.2°F | Resp 18 | Wt 154.1 lb

## 2021-04-19 DIAGNOSIS — D701 Agranulocytosis secondary to cancer chemotherapy: Secondary | ICD-10-CM | POA: Diagnosis not present

## 2021-04-19 DIAGNOSIS — Z5112 Encounter for antineoplastic immunotherapy: Secondary | ICD-10-CM | POA: Diagnosis not present

## 2021-04-19 DIAGNOSIS — R200 Anesthesia of skin: Secondary | ICD-10-CM | POA: Diagnosis not present

## 2021-04-19 DIAGNOSIS — C3411 Malignant neoplasm of upper lobe, right bronchus or lung: Secondary | ICD-10-CM | POA: Diagnosis not present

## 2021-04-19 DIAGNOSIS — C7931 Secondary malignant neoplasm of brain: Secondary | ICD-10-CM | POA: Diagnosis not present

## 2021-04-19 DIAGNOSIS — Z95828 Presence of other vascular implants and grafts: Secondary | ICD-10-CM

## 2021-04-19 DIAGNOSIS — C3490 Malignant neoplasm of unspecified part of unspecified bronchus or lung: Secondary | ICD-10-CM

## 2021-04-19 DIAGNOSIS — Z5111 Encounter for antineoplastic chemotherapy: Secondary | ICD-10-CM | POA: Diagnosis not present

## 2021-04-19 DIAGNOSIS — E032 Hypothyroidism due to medicaments and other exogenous substances: Secondary | ICD-10-CM

## 2021-04-19 LAB — CMP (CANCER CENTER ONLY)
ALT: 15 U/L (ref 0–44)
AST: 13 U/L — ABNORMAL LOW (ref 15–41)
Albumin: 3.7 g/dL (ref 3.5–5.0)
Alkaline Phosphatase: 67 U/L (ref 38–126)
Anion gap: 9 (ref 5–15)
BUN: 13 mg/dL (ref 8–23)
CO2: 26 mmol/L (ref 22–32)
Calcium: 8.7 mg/dL — ABNORMAL LOW (ref 8.9–10.3)
Chloride: 107 mmol/L (ref 98–111)
Creatinine: 0.8 mg/dL (ref 0.61–1.24)
GFR, Estimated: >60 mL/min (ref >60)
Glucose, Bld: 148 mg/dL — ABNORMAL HIGH (ref 70–99)
Potassium: 3.8 mmol/L (ref 3.5–5.1)
Sodium: 142 mmol/L (ref 135–145)
Total Bilirubin: 0.3 mg/dL (ref 0.3–1.2)
Total Protein: 6.7 g/dL (ref 6.5–8.1)

## 2021-04-19 LAB — CBC WITH DIFFERENTIAL (CANCER CENTER ONLY)
Abs Immature Granulocytes: 0.02 10*3/uL (ref 0.00–0.07)
Basophils Absolute: 0 10*3/uL (ref 0.0–0.1)
Basophils Relative: 0 %
Eosinophils Absolute: 0 10*3/uL (ref 0.0–0.5)
Eosinophils Relative: 0 %
HCT: 32.8 % — ABNORMAL LOW (ref 39.0–52.0)
Hemoglobin: 10.8 g/dL — ABNORMAL LOW (ref 13.0–17.0)
Immature Granulocytes: 0 %
Lymphocytes Relative: 8 %
Lymphs Abs: 0.5 10*3/uL — ABNORMAL LOW (ref 0.7–4.0)
MCH: 32.5 pg (ref 26.0–34.0)
MCHC: 32.9 g/dL (ref 30.0–36.0)
MCV: 98.8 fL (ref 80.0–100.0)
Monocytes Absolute: 0.5 10*3/uL (ref 0.1–1.0)
Monocytes Relative: 8 %
Neutro Abs: 5.6 10*3/uL (ref 1.7–7.7)
Neutrophils Relative %: 84 %
Platelet Count: 277 10*3/uL (ref 150–400)
RBC: 3.32 MIL/uL — ABNORMAL LOW (ref 4.22–5.81)
RDW: 13.3 % (ref 11.5–15.5)
WBC Count: 6.6 10*3/uL (ref 4.0–10.5)
nRBC: 0 % (ref 0.0–0.2)

## 2021-04-19 LAB — TSH: TSH: 1.261 u[IU]/mL (ref 0.320–4.118)

## 2021-04-19 MED ORDER — SODIUM CHLORIDE 0.9 % IV SOLN
500.0000 mg/m2 | Freq: Once | INTRAVENOUS | Status: AC
  Administered 2021-04-19: 900 mg via INTRAVENOUS
  Filled 2021-04-19: qty 20

## 2021-04-19 MED ORDER — SODIUM CHLORIDE 0.9% FLUSH
10.0000 mL | Freq: Once | INTRAVENOUS | Status: AC
  Administered 2021-04-19: 10 mL

## 2021-04-19 MED ORDER — SODIUM CHLORIDE 0.9% FLUSH
10.0000 mL | INTRAVENOUS | Status: DC | PRN
  Administered 2021-04-19: 10 mL

## 2021-04-19 MED ORDER — HEPARIN SOD (PORK) LOCK FLUSH 100 UNIT/ML IV SOLN
500.0000 [IU] | Freq: Once | INTRAVENOUS | Status: AC | PRN
  Administered 2021-04-19: 500 [IU]

## 2021-04-19 MED ORDER — SODIUM CHLORIDE 0.9 % IV SOLN
200.0000 mg | Freq: Once | INTRAVENOUS | Status: AC
  Administered 2021-04-19: 200 mg via INTRAVENOUS
  Filled 2021-04-19: qty 8

## 2021-04-19 MED ORDER — PROCHLORPERAZINE MALEATE 10 MG PO TABS
10.0000 mg | ORAL_TABLET | Freq: Once | ORAL | Status: AC
  Administered 2021-04-19: 10 mg via ORAL
  Filled 2021-04-19: qty 1

## 2021-04-19 MED ORDER — SODIUM CHLORIDE 0.9 % IV SOLN: Freq: Once | INTRAVENOUS | Status: AC

## 2021-04-19 NOTE — Patient Instructions (Signed)
[facility] ONCOL[facility]  Discharge Instructions: Thank you for choosing [facility] to provide your oncology and hematology care.   If you have a lab appointment with the [facility], please go directly to the [facility] and check in at the registration area.   Wear comfortable clothing and clothing appropriate for easy access to any Portacath or PICC line.   We strive to give you quality time with your provider. You may need to reschedule your appointment if you arrive late (15 or more minutes).  Arriving late affects you and other patients whose appointments are after yours.  Also, if you miss three or more appointments without notifying the office, you may be dismissed from the clinic at the providers discretion.      For prescription refill requests, have your pharmacy contact our office and allow 72 hours for refills to be completed.    Today you received the following chemotherapy and/or immunotherapy agents: Alimta, Keytruda.   To help prevent nausea and vomiting after your treatment, we encourage you to take your nausea medication as directed.  BELOW ARE SYMPTOMS THAT SHOULD BE REPORTED IMMEDIATELY: *FEVER GREATER THAN 100.4 F (38 C) OR HIGHER *CHILLS OR SWEATING *NAUSEA AND VOMITING THAT IS NOT CONTROLLED WITH YOUR NAUSEA MEDICATION *UNUSUAL SHORTNESS OF BREATH *UNUSUAL BRUISING OR BLEEDING *URINARY PROBLEMS (pain or burning when urinating, or frequent urination) *BOWEL PROBLEMS (unusual diarrhea, constipation, pain near the anus) TENDERNESS IN MOUTH AND THROAT WITH OR WITHOUT PRESENCE OF ULCERS (sore throat, sores in mouth, or a toothache) UNUSUAL RASH, SWELLING OR PAIN  UNUSUAL VAGINAL DISCHARGE OR ITCHING   Items with * indicate a potential emergency and should be followed up as soon as possible or go to the Emergency Department if any problems should occur.  Please show the CHEMOTHERAPY ALERT CARD or IMMUNOTHERAPY ALERT CARD at  check-in to the Emergency Department and triage nurse.  Should you have questions after your visit or need to cancel or reschedule your appointment, please contact [facility]  Dept: [phone]  and follow the prompts.  Office hours are 8:00 a.m. to 4:30 p.m. Monday - Friday. Please note that voicemails left after 4:00 p.m. may not be returned until the following business day.  We are closed weekends and major holidays. You have access to a nurse at all times for urgent questions. Please call the main number to the clinic Dept: [phone] and follow the prompts.   For any non-urgent questions, you may also contact your provider using MyChart. We now offer e-Visits for anyone 40 and older to request care online for non-urgent symptoms. For details visit [email or web address].   Also download the MyChart app! Go to the app store, search "MyChart", open the app, select [facility], and log in with your MyChart username and password.  Due to Covid, a mask is required upon entering the hospital/clinic. If you do not have a mask, one will be given to you upon arrival. For doctor visits, patients may have 1 support person aged 44 or older with them. For treatment visits, patients cannot have anyone with them due to current Covid guidelines and our immunocompromised population.

## 2021-04-19 NOTE — Progress Notes (Signed)
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  This was followed by an MRI of the brain with and without contrast which showed 4 brain lesions consistent with metastases, extensive vasogenic edema associated with the posterior right frontal lesion without midline shift. 6/15-6/28/2022: 10 fractions of WBRT with Rad/Onc 11/23/2020: Guardant 360 returns with no targetable mutation.  12/14/2020: Cycle 1 Day 1 of Carbo/Pem/Pem 01/04/2021: Cycle 2 Day 1 of Carbo/Pem/Pem 01/25/2021: Cycle 3 Day 1 of Carbo/Pem/Pem 02/15/2021: Cycle 4 Day 1 of Carbo/Pem/Pem 03/08/2021: Cycle 5 Day 1 of maintenance Pem/Pem 03/29/2021: Cycle 6 Day 1 of maintenance Pem/Pem 04/19/2021: Cycle 7 Day 1 of maintenance Pem/Pem  Interval History:  Benjamin Willis. 75 y.o. 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 The patient's last visit was on 03/29/2021.  In the interim since the last visit he has completed Cycle 6 of maintenance chemotherapy. He presents today for Cycle 7 Day 1.   On exam today Mrs. Robart is unaccompanied.  He reports his energy levels are fairly stable.  He ambulates on his own using a walker but uses a wheelchair for long distances.  He continues to have intermittent episodes of numbness and  weakness in his left lower extremity.  He is currently on dexamethasone 2 mg daily which improved his symptoms so Dr. Mickeal Skinner recommended to continue.  Otherwise, he reports that his energy and appetite are unchanged. He denies nausea, vomiting or abdominal pain. His bowel habits are unchanged with diarrhea or constipation. He has stable shortness of breath mainly with exertion.  He currently denies any fevers, chills, night sweats, chest pain or cough.  Full 10 point ROS is listed below.  MEDICAL HISTORY:  Past Medical History:  Diagnosis Date   Allergy    Diabetes mellitus without complication ([place])    Type II   Glaucoma 10/16/2020   History of radiation therapy    [place]- SBRT 02/21/19-02/28/19, [facility] Lung- IMRT 02/29/20-04/16/20 Dr. Gery Pray   History of radiation therapy 10/30/2020   whole brain 10/17/2020-10/30/2020 Dr Gery Pray   Hyperlipidemia    Hypertension    Lung cancer ([place])    Stage IA2 (cT1b, N0) non-small cell lung cancer (adenocarcinoma)    SURGICAL HISTORY: Past Surgical History:  Procedure Laterality Date   ELECTROMAGNETIC NAVIGATION BROCHOSCOPY Right 02/02/2019   Procedure: VIDEO BRONCHOSCOPY WITH NAVIGATION AND FIDUCIAL PLACEMENT;  Surgeon: Garner Nash, DO;  Location: [facility];  Service: Cardiopulmonary;  Laterality: Right;   FUDUCIAL PLACEMENT Right 02/02/2019   Procedure: Placement Of Fuducial Right Upper Lobe;  Surgeon: Garner Nash, DO;  Location: [facility];  Service: Cardiopulmonary;  Laterality: Right;   IR IMAGING GUIDED PORT INSERTION  12/13/2020   none     VIDEO BRONCHOSCOPY WITH RADIAL ENDOBRONCHIAL ULTRASOUND Right 02/02/2019   Procedure: Video Bronchoscopy With Radial Endobronchial Ultrasound;  Surgeon: Garner Nash, DO;  Location: [facility];  Service: Cardiopulmonary;  Laterality: Right;    SOCIAL HISTORY: Social History   Socioeconomic History   Marital status: Single    Spouse name: Not on file   Number of children: 0   Years of education:  Not on file   Highest education level: 9th grade  Occupational History   Not on file  Tobacco Use   Smoking status: Former    Packs/day: 1.00    Years: 48.00    Pack years: 48.00    Types: Cigarettes    Quit date: 02/20/2015    Years since quitting: 6.1   Smokeless tobacco: Former    Quit date: 10/2013  Vaping Use   Vaping Use: Never used  Substance and Sexual Activity   Alcohol use: No   Drug use: No   Sexual activity: Yes    Birth control/protection: Condom  Other Topics Concern   Not on file  Social History Narrative   Not on file   Social Determinants of Health   Financial Resource Strain: Not on file  Food Insecurity: Not on file  Transportation Needs: Not on file  Physical Activity: Not on file  Stress: Not on file  Social Connections: Not on file  Intimate Partner Violence: Not on file    FAMILY HISTORY: Family History  Problem Relation Age of Onset   Lung cancer Neg Hx     ALLERGIES:  has No Known Allergies.  MEDICATIONS:  Current Outpatient Medications  Medication Sig Dispense Refill   acetaminophen (TYLENOL) 500 MG tablet Take 1,000 mg by mouth every 6 (six) hours as needed for mild pain.     albuterol (VENTOLIN HFA) 108 (90 Base) MCG/ACT inhaler Inhale 2 puffs into the lungs every 6 (six) hours as needed for wheezing or shortness of breath. 18 g 6   amLODipine (NORVASC) 10 MG tablet Take 1 tablet by mouth once daily 90 tablet 0   ammonium lactate (AMLACTIN) 12 % lotion Apply 1 application topically as needed for dry skin. 400 g 0   blood glucose meter kit and supplies KIT Dispense based on patient and insurance preference. Use up to four times daily as directed. 1 each 0   Blood Pressure Monitoring (BLOOD PRESSURE MONITOR/ARM) DEVI Check BP once a day. DX I 10 1 each 0   cetirizine (ZYRTEC) 10 MG tablet Take 1 tablet (10 mg total) by mouth daily. (Patient taking differently: Take 10 mg by mouth daily as needed for allergies.) 30 tablet 11    dexamethasone (DECADRON) 2 MG tablet Take 1 tablet (2 mg total) by mouth daily. 60 tablet 1   erythromycin ophthalmic ointment Place 1 application into the left eye at bedtime. 3.5 g 0   folic acid (FOLVITE) 1 MG tablet Take 1 tablet (1 mg total) by mouth daily. 90 tablet 1   furosemide (LASIX) 20 MG tablet Take 20 mg by mouth daily.     glipiZIDE (GLUCOTROL) 5 MG tablet TAKE 1 TABLET BY MOUTH TWICE DAILY WITH A MEAL (Patient taking differently: Take 5 mg by mouth 2 (two) times daily before a meal.) 180 tablet 0   latanoprost (XALATAN) 0.005 % ophthalmic solution Place 1 drop into both eyes at bedtime. 2.5 mL 3   lidocaine-prilocaine (EMLA) cream Apply 1 application topically as needed. (Patient taking differently: Apply 1 application topically as needed (for port before tx).) 30 g 0   metFORMIN (GLUCOPHAGE) 500 MG tablet TAKE 1 TABLET BY MOUTH TWICE DAILY WITH MEALS (Patient taking differently: Take 500 mg  by mouth daily with breakfast.) 180 tablet 0   Multiple Vitamins-Minerals (MULTIVITAMIN MEN) TABS Take 1 tablet by mouth daily.     ondansetron (ZOFRAN) 8 MG tablet Take 1 tablet (8 mg total) by mouth every 8 (eight) hours as needed for nausea or vomiting. 30 tablet 0   potassium chloride SA (KLOR-CON) 20 MEQ tablet Take 20 mEq by mouth daily.     prochlorperazine (COMPAZINE) 10 MG tablet Take 1 tablet (10 mg total) by mouth every 6 (six) hours as needed for nausea or vomiting. 30 tablet 0   rosuvastatin (CRESTOR) 20 MG tablet Take by mouth.     gabapentin (NEURONTIN) 300 MG capsule Take 1 capsule (300 mg total) by mouth at bedtime. 90 capsule 1   levETIRAcetam (KEPPRA) 1000 MG tablet Take 1 tablet (1,000 mg total) by mouth 2 (two) times daily. 180 tablet 1   lisinopril (ZESTRIL) 40 MG tablet Take 1 tablet (40 mg total) by mouth daily. 90 tablet 3   sitaGLIPtin (JANUVIA) 100 MG tablet Take 1 tablet (100 mg total) by mouth daily. 90 tablet 3   No current facility-administered medications for this  visit.    REVIEW OF SYSTEMS:   Constitutional: ( - ) fevers, ( - )  chills , ( - ) night sweats Eyes: ( - ) blurriness of vision, ( - ) double vision, ( - ) watery eyes Ears, nose, mouth, throat, and face: ( - ) mucositis, ( - ) sore throat Respiratory: ( - ) cough, ( - ) dyspnea, ( - ) wheezes Cardiovascular: ( - ) palpitation, ( - ) chest discomfort, ( - ) lower extremity swelling Gastrointestinal:  ( - ) nausea, ( - ) heartburn, ( - ) change in bowel habits Skin: ( - ) abnormal skin rashes Lymphatics: ( - ) new lymphadenopathy, ( - ) easy bruising Neurological: ( - ) numbness, ( - ) tingling, ( - ) new weaknesses Behavioral/Psych: ( - ) mood change, ( - ) new changes  All other systems were reviewed with the patient and are negative.  PHYSICAL EXAMINATION:  Vitals:   04/19/21 1335  BP: 140/73  Pulse: 72  Resp: 18  Temp: 98.2 F (36.8 C)  SpO2: 100%    Filed Weights   04/19/21 1335  Weight: 154 lb 1.6 oz (69.9 kg)     GENERAL: Well-appearing elderly African-American male, alert, no distress and comfortable SKIN:  Skin color, texture, turgor are normal, no rashes or significant lesions EYES: conjunctiva are pink and non-injected, sclera clear LUNGS: clear to auscultation and percussion with normal breathing effort HEART: regular rate & rhythm and no murmurs and no lower extremity edema PSYCH: alert & oriented x 3, fluent speech NEURO: No focal deficits. No facial droop.   LABORATORY DATA:  I have reviewed the data as listed CBC Latest Ref Rng & Units 04/19/2021 03/29/2021 03/08/2021  WBC 4.0 - 10.5 K/uL 6.6 4.4 2.6(L)  Hemoglobin 13.0 - 17.0 g/dL 10.8(L) 10.7(L) 9.8(L)  Hematocrit 39.0 - 52.0 % 32.8(L) 32.3(L) 29.4(L)  Platelets 150 - 400 K/uL 277 367 221    CMP Latest Ref Rng & Units 03/29/2021 03/08/2021 02/15/2021  Glucose 70 - 99 mg/dL 104(H) 189(H) 206(H)  BUN 8 - 23 mg/dL _0 Creatinine 0.61 - 1.24 mg/dL 0.67 0.71 0.63  Sodium 135 - 145 mmol/L 143 142  138  Potassium 3.5 - 5.1 mmol/L 3.9 3.8 3.8  Chloride 98 - 111 mmol/L 108 107 104  CO2 22 -  32 mmol/L _0 Calcium 8.9 - 10.3 mg/dL 9.2 8.8(L) 8.8(L)  Total Protein 6.5 - 8.1 g/dL 6.7 6.4(L) 6.7  Total Bilirubin 0.3 - 1.2 mg/dL 0.3 0.3 0.4  Alkaline Phos 38 - 126 U/L 79 76 63  AST 15 - 41 U/L _1 ALT 0 - 44 U/L _2 RADIOGRAPHIC STUDIES:  No results found.  ASSESSMENT & PLAN Benjamin Willis. 75 y.o. 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  After review the labs, reviewed records, discussion with the patient the findings are most consistent with metastatic adenocarcinoma with spread to the brain.  Unfortunately we do not have new pathology as the lesions in his brain were not resectable and he underwent radiation therapy instead.  We can presume based off his prior pathology that this represents the original adenocarcinoma.  As such I do believe it is reasonable to proceed with carboplatin, pembrolizumab, pemetrexed therapy in order to help treat any possible micrometastatic disease.  The patient also underwent whole brain radiation therapy instead of SRS.  Therefore I do believe systemic therapy will be reasonable.  This plan of care was discussed with the resident lung expert Dr. Julien Nordmann. Additionally our Harmony 360 testing was negative for a targetable mutation.   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 Lost to follow up --presents with brain metastasis only. No evidence of disease in the C/A/P noted on CT scan from 03/05/2021.  --Patient completed whole brain radiation, but did not wish to undergo SRS.  Radiation oncology noted that this may be an option in the future if he were to have recurrence in the brain --Guardant 360 on 11/01/2020 showed no  targetable mutations.  Plan: --Labs today were reviewed. No intervention required and adequate to proceed with treatment.  --plan to proceed with maintenance Pem/Pem chemotherapy, Cycle 7 Day 1 today. --Plan for CT in 3 months (Feb 2023) --continue to follow with neuro-oncology for brain metastasis --RTC in 3 weeks for Cycle 8 of chemotherapy with maintenance pembrolizumab/pemetrexed.    #Left leg weakness --Improved with corticosteroid therapy, continue on dexamethasone 2 mg daily  --Plan to obtain repeat MRI brian in the next 4 weeks.  --Continue to monitor.  #Chemotherapy Induced Anemia #Chemotherapy Induced Leukopenia --today WBC 6.6 and Hgb 10.8. ANC 5.6. stable or improved from prior.  --continue to monitor   #Supportive Care -- chemotherapy education complete  -- port placed --daily PO 10m folic acid with pemetrexed. -- zofran [id number] PRN and compazine 101mO q6H for nausea -- EMLA cream for port -- no pain medication required at this time.   No orders of the defined types were placed in this encounter.   All questions were answered. The patient knows to call the clinic with any problems, questions or concerns.  I have spent a total of 30 minutes minutes of face-to-face and non-face-to-face time, preparing to see the patient, performing a medically appropriate examination, counseling and educating the patient, ordering medications, referring and communicating with other health care professionals, documenting clinical information in the electronic health record, and care coordination.   IreLincoln BrighamA-C Department of Hematology/Oncology [facility]: [phone]/16/2022 1:59 PM

## 2021-04-21 ENCOUNTER — Other Ambulatory Visit: Payer: Self-pay

## 2021-04-23 ENCOUNTER — Other Ambulatory Visit: Payer: Self-pay | Admitting: Emergency Medicine

## 2021-04-23 DIAGNOSIS — E1159 Type 2 diabetes mellitus with other circulatory complications: Secondary | ICD-10-CM

## 2021-04-24 ENCOUNTER — Encounter: Payer: Self-pay | Admitting: Hematology and Oncology

## 2021-05-01 ENCOUNTER — Other Ambulatory Visit: Payer: Self-pay

## 2021-05-01 ENCOUNTER — Encounter: Payer: Self-pay | Admitting: Emergency Medicine

## 2021-05-01 ENCOUNTER — Ambulatory Visit (INDEPENDENT_AMBULATORY_CARE_PROVIDER_SITE_OTHER): Payer: Medicare Other | Admitting: Emergency Medicine

## 2021-05-01 VITALS — BP 138/78 | HR 73 | Ht 66.0 in | Wt 155.0 lb

## 2021-05-01 DIAGNOSIS — C3490 Malignant neoplasm of unspecified part of unspecified bronchus or lung: Secondary | ICD-10-CM | POA: Diagnosis not present

## 2021-05-01 DIAGNOSIS — I152 Hypertension secondary to endocrine disorders: Secondary | ICD-10-CM | POA: Diagnosis not present

## 2021-05-01 DIAGNOSIS — E785 Hyperlipidemia, unspecified: Secondary | ICD-10-CM | POA: Diagnosis not present

## 2021-05-01 DIAGNOSIS — E1169 Type 2 diabetes mellitus with other specified complication: Secondary | ICD-10-CM

## 2021-05-01 DIAGNOSIS — E1159 Type 2 diabetes mellitus with other circulatory complications: Secondary | ICD-10-CM | POA: Diagnosis not present

## 2021-05-01 DIAGNOSIS — H5789 Other specified disorders of eye and adnexa: Secondary | ICD-10-CM

## 2021-05-01 LAB — POCT GLYCOSYLATED HEMOGLOBIN (HGB A1C): Hemoglobin A1C: 6.7 % — AB (ref 4.0–5.6)

## 2021-05-01 MED ORDER — FOLIC ACID 1 MG PO TABS: 1.0000 mg | ORAL_TABLET | Freq: Every day | ORAL | 1 refills | Status: DC

## 2021-05-01 MED ORDER — PANTOPRAZOLE SODIUM 40 MG PO TBEC: 40.0000 mg | DELAYED_RELEASE_TABLET | Freq: Every day | ORAL | 1 refills | Status: DC

## 2021-05-01 MED ORDER — ERYTHROMYCIN 5 MG/GM OP OINT: 1.0000 "application " | TOPICAL_OINTMENT | Freq: Every day | OPHTHALMIC | 0 refills | Status: DC

## 2021-05-01 MED ORDER — SITAGLIPTIN PHOSPHATE 100 MG PO TABS: 100.0000 mg | ORAL_TABLET | Freq: Every day | ORAL | 3 refills | Status: DC

## 2021-05-01 MED ORDER — ROSUVASTATIN CALCIUM 20 MG PO TABS: 20.0000 mg | ORAL_TABLET | Freq: Every day | ORAL | 1 refills | Status: DC

## 2021-05-01 NOTE — Telephone Encounter (Signed)
Refilled medications

## 2021-05-01 NOTE — Assessment & Plan Note (Signed)
Well-controlled hypertension.  Continue amlodipine 10 mg daily. BP Readings from Last 3 Encounters:  05/01/21 138/78  04/19/21 140/73  04/09/21 140/83  Normal recent GFR 2 weeks ago. Well-controlled diabetes with hemoglobin A1c of 6.7. Continue 500 mg metformin in the morning and glipizide 5 mg twice a day. Continue Januvia 100 mg daily. Follow-up in 3 months.

## 2021-05-01 NOTE — Assessment & Plan Note (Signed)
Stable.  Diet and nutrition discussed.  Continue rosuvastatin 20 mg daily.  

## 2021-05-01 NOTE — Assessment & Plan Note (Signed)
Stable.  Recent oncology evaluation office note reviewed.

## 2021-05-01 NOTE — Patient Instructions (Signed)

## 2021-05-01 NOTE — Progress Notes (Signed)
Benjamin Willis. 75 y.o.   Chief Complaint  Patient presents with   Diabetes    Diabetic and BP f/ u. Pt states he gets cramps in right leg at times    HISTORY OF PRESENT ILLNESS: This is a 75 y.o. male with history of diabetes and hypertension here for follow-up. Also has a history of metastatic lung cancer. Here with sister.  Normal blood pressure readings at home.  Near normal glucose readings at home. Doing well.  Good appetite.  Sleeping well. Has no complaints or any other medical concerns today. Lab Results  Component Value Date   HGBA1C 8.6 (H) 01/16/2021   BP Readings from Last 3 Encounters:  05/01/21 138/78  04/19/21 140/73  04/09/21 140/83     Diabetes Pertinent negatives for hypoglycemia include no dizziness or headaches. Pertinent negatives for diabetes include no chest pain.  Medication Refill Pertinent negatives include no abdominal pain, chest pain, chills, congestion, coughing, fever, headaches, nausea, rash, sore throat or vomiting.    Prior to Admission medications   Medication Sig Start Date End Date Taking? Authorizing Provider  acetaminophen (TYLENOL) 500 MG tablet Take 1,000 mg by mouth every 6 (six) hours as needed for mild pain.   Yes [provider]  albuterol (VENTOLIN HFA) 108 (90 Base) MCG/ACT inhaler Inhale 2 puffs into the lungs every 6 (six) hours as needed for wheezing or shortness of breath. 01/19/19  Yes Icard, Octavio Graves, DO  amLODipine (NORVASC) 10 MG tablet Take 1 tablet by mouth once daily 02/26/21  Yes Tamecka Milham, [place], MD  ammonium lactate (AMLACTIN) 12 % lotion Apply 1 application topically as needed for dry skin. 01/24/21  Yes Ailene Ards, NP  blood glucose meter kit and supplies KIT Dispense based on patient and insurance preference. Use up to four times daily as directed. 10/23/20  Yes Elodia Florence., MD  Blood Pressure Monitoring (BLOOD PRESSURE MONITOR/ARM) DEVI Check BP once a day. DX I 10 04/18/19  Yes  Ameir Faria, Ines Bloomer, MD  cetirizine (ZYRTEC) 10 MG tablet Take 1 tablet (10 mg total) by mouth daily. Patient taking differently: Take 10 mg by mouth daily as needed for allergies. 09/03/17  Yes Jaynee Eagles, PA-C  dexamethasone (DECADRON) 2 MG tablet Take 1 tablet (2 mg total) by mouth daily. 04/09/21  Yes Vaslow, Acey Lav, MD  erythromycin ophthalmic ointment Place 1 application into the left eye at bedtime. 01/24/21  Yes Ailene Ards, NP  folic acid (FOLVITE) 1 MG tablet Take 1 tablet (1 mg total) by mouth daily. 11/30/20  Yes Orson Slick, MD  furosemide (LASIX) 20 MG tablet Take 20 mg by mouth daily. 01/23/21  Yes [provider]  glipiZIDE (GLUCOTROL) 5 MG tablet TAKE 1 TABLET BY MOUTH TWICE DAILY WITH A MEAL 04/23/21  Yes Euline Kimbler, [place], MD  latanoprost (XALATAN) 0.005 % ophthalmic solution Place 1 drop into both eyes at bedtime. 01/24/21  Yes Ailene Ards, NP  lidocaine-prilocaine (EMLA) cream Apply 1 application topically as needed. Patient taking differently: Apply 1 application topically as needed (for port before tx). 11/30/20  Yes Orson Slick, MD  metFORMIN (GLUCOPHAGE) 500 MG tablet TAKE 1 TABLET BY MOUTH TWICE DAILY WITH MEALS Patient taking differently: Take 500 mg by mouth daily with breakfast. 11/22/20  Yes Gyasi Hazzard, Ines Bloomer, MD  Multiple Vitamins-Minerals (MULTIVITAMIN MEN) TABS Take 1 tablet by mouth daily.   Yes [provider]  ondansetron (ZOFRAN) 8 MG tablet Take 1  tablet (8 mg total) by mouth every 8 (eight) hours as needed for nausea or vomiting. 11/30/20  Yes Jaci Standard, MD  potassium chloride SA (KLOR-CON) 20 MEQ tablet Take 20 mEq by mouth daily. 01/13/21  Yes [provider]  prochlorperazine (COMPAZINE) 10 MG tablet Take 1 tablet (10 mg total) by mouth every 6 (six) hours as needed for nausea or vomiting. 11/30/20  Yes Jaci Standard, MD  rosuvastatin (CRESTOR) 20 MG tablet Take by mouth. 01/22/21  Yes [provider]  allopurinol (ZYLOPRIM) 100 MG tablet Take 100 mg by mouth daily. 02/01/21   [provider]  amLODipine-benazepril (LOTREL) 10-20 MG capsule Take 1 capsule by mouth daily. 02/21/21   [provider]  gabapentin (NEURONTIN) 300 MG capsule Take 1 capsule (300 mg total) by mouth at bedtime. 12/19/20 03/19/21  Georgina Quint, MD  levETIRAcetam (KEPPRA) 1000 MG tablet Take 1 tablet (1,000 mg total) by mouth 2 (two) times daily. 12/19/20 03/19/21  Georgina Quint, MD  lisinopril (ZESTRIL) 40 MG tablet Take 1 tablet (40 mg total) by mouth daily. 12/06/19 01/07/21  Georgina Quint, MD  pantoprazole (PROTONIX) 40 MG tablet Take 40 mg by mouth daily. 02/26/21   [provider]  sitaGLIPtin (JANUVIA) 100 MG tablet Take 1 tablet (100 mg total) by mouth daily. 05/31/20 01/07/21  Georgina Quint, MD    No Known Allergies  Patient Active Problem List   Diagnosis Date Noted   Leg weakness 04/01/2021   E. coli urinary tract infection 01/07/2021   Port-A-Cath in place 12/14/2020   Brain metastases [facility])    Palliative care by specialist    Metastatic lung cancer (metastasis from lung to other site) (HCC) 10/16/2020   Diabetes mellitus type 2 in nonobese (HCC) 10/16/2020   Glaucoma 10/16/2020   Lung cancer metastatic to brain (HCC) 10/16/2020   Atherosclerosis of aorta (HCC) 05/31/2020   Primary cancer of right upper lobe of lung (HCC) 02/07/2019   Lung nodule 02/02/2019   Hypertension associated with type 2 diabetes mellitus (HCC) 05/24/2018   Dyslipidemia 05/24/2018   Dyslipidemia associated with type 2 diabetes mellitus (HCC) 06/09/2017    Past Medical History:  Diagnosis Date   Allergy    Diabetes mellitus without complication (HCC)    Type II   Glaucoma 10/16/2020   History of radiation therapy    [facility]orax- SBRT 02/21/19-02/28/19, Rt Lung- IMRT 02/29/20-04/16/20 Dr. Antony Blackbird   History of radiation therapy 10/30/2020   whole brain  10/17/2020-10/30/2020 Dr Antony Blackbird   Hyperlipidemia    Hypertension    Lung cancer (HCC)    Stage IA2 (cT1b, N0) non-small cell lung cancer (adenocarcinoma)    Past Surgical History:  Procedure Laterality Date   ELECTROMAGNETIC NAVIGATION BROCHOSCOPY Right 02/02/2019   Procedure: VIDEO BRONCHOSCOPY WITH NAVIGATION AND FIDUCIAL PLACEMENT;  Surgeon: Josephine Igo, DO;  Location: [facility] OR;  Service: Cardiopulmonary;  Laterality: Right;   FUDUCIAL PLACEMENT Right 02/02/2019   Procedure: Placement Of Fuducial Right Upper Lobe;  Surgeon: Josephine Igo, DO;  Location: [facility] OR;  Service: Cardiopulmonary;  Laterality: Right;   IR IMAGING GUIDED PORT INSERTION  12/13/2020   none     VIDEO BRONCHOSCOPY WITH RADIAL ENDOBRONCHIAL ULTRASOUND Right 02/02/2019   Procedure: Video Bronchoscopy With Radial Endobronchial Ultrasound;  Surgeon: Josephine Igo, DO;  Location: [facility] OR;  Service: Cardiopulmonary;  Laterality: Right;    Social History   Socioeconomic History   Marital status: Single  Spouse name: Not on file   Number of children: 0   Years of education: Not on file   Highest education level: 9th grade  Occupational History   Not on file  Tobacco Use   Smoking status: Former    Packs/day: 1.00    Years: 48.00    Pack years: 48.00    Types: Cigarettes    Quit date: 02/20/2015    Years since quitting: 6.1   Smokeless tobacco: Former    Quit date: 10/2013  Vaping Use   Vaping Use: Never used  Substance and Sexual Activity   Alcohol use: No   Drug use: No   Sexual activity: Yes    Birth control/protection: Condom  Other Topics Concern   Not on file  Social History Narrative   Not on file   Social Determinants of Health   Financial Resource Strain: Not on file  Food Insecurity: Not on file  Transportation Needs: Not on file  Physical Activity: Not on file  Stress: Not on file  Social Connections: Not on file  Intimate Partner Violence: Not on file    Family History   Problem Relation Age of Onset   Lung cancer Neg Hx      Review of Systems  Constitutional: Negative.  Negative for chills and fever.  HENT: Negative.  Negative for congestion and sore throat.   Respiratory: Negative.  Negative for cough and shortness of breath.   Cardiovascular: Negative.  Negative for chest pain and palpitations.  Gastrointestinal: Negative.  Negative for abdominal pain, diarrhea, nausea and vomiting.  Genitourinary: Negative.  Negative for dysuria and hematuria.  Musculoskeletal: Negative.   Skin: Negative.  Negative for rash.  Neurological: Negative.  Negative for dizziness and headaches.  All other systems reviewed and are negative.   Physical Exam Vitals reviewed.  Constitutional:      Appearance: Normal appearance.  HENT:     Head: Normocephalic.  Eyes:     Extraocular Movements: Extraocular movements intact.     Pupils: Pupils are equal, round, and reactive to light.  Cardiovascular:     Rate and Rhythm: Normal rate and regular rhythm.     Heart sounds: Normal heart sounds.  Pulmonary:     Effort: Pulmonary effort is normal.     Breath sounds: Normal breath sounds.  Musculoskeletal:     Cervical back: No tenderness.  Skin:    General: Skin is warm and dry.     Capillary Refill: Capillary refill takes less than 2 seconds.  Neurological:     General: No focal deficit present.     Mental Status: He is alert and oriented to person, place, and time.  Psychiatric:        Mood and Affect: Mood normal.        Behavior: Behavior normal.   Results for orders placed or performed in visit on 05/01/21 (from the past 24 hour(s))  POCT glycosylated hemoglobin (Hb A1C)     Status: Abnormal   Collection Time: 05/01/21 10:42 AM  Result Value Ref Range   Hemoglobin A1C 6.7 (A) 4.0 - 5.6 %   HbA1c POC (<> result, manual entry)     HbA1c, POC (prediabetic range)     HbA1c, POC (controlled diabetic range)        ASSESSMENT & PLAN: Problem List Items  Addressed This Visit       Cardiovascular and Mediastinum   Hypertension associated with type 2 diabetes mellitus ([place]) - Primary  Well-controlled hypertension.  Continue amlodipine 10 mg daily. BP Readings from Last 3 Encounters:  05/01/21 138/78  04/19/21 140/73  04/09/21 140/83  Normal recent GFR 2 weeks ago. Well-controlled diabetes with hemoglobin A1c of 6.7. Continue 500 mg metformin in the morning and glipizide 5 mg twice a day. Continue Januvia 100 mg daily. Follow-up in 3 months.       Relevant Medications   amLODipine-benazepril (LOTREL) 10-20 MG capsule   sitaGLIPtin (JANUVIA) 100 MG tablet   Other Relevant Orders   Microalbumin, urine   POCT glycosylated hemoglobin (Hb A1C) (Completed)     Respiratory   Metastatic lung cancer (metastasis from lung to other site) (HCC)    Stable.  Recent oncology evaluation office note reviewed.      Relevant Medications   allopurinol (ZYLOPRIM) 100 MG tablet     Endocrine   Dyslipidemia associated with type 2 diabetes mellitus ([place])    Stable.  Diet and nutrition discussed.  Continue rosuvastatin 20 mg daily.      Relevant Medications   amLODipine-benazepril (LOTREL) 10-20 MG capsule   sitaGLIPtin (JANUVIA) 100 MG tablet   Patient Instructions  Diabetes Mellitus and Nutrition, Adult When you have diabetes, or diabetes mellitus, it is very important to have healthy eating habits because your blood sugar (glucose) levels are greatly affected by what you eat and drink. Eating healthy foods in the right amounts, at about the same times every day, can help you: Manage your blood glucose. Lower your risk of heart disease. Improve your blood pressure. Reach or maintain a healthy weight. What can affect my meal plan? Every person with diabetes is different, and each person has different needs for a meal plan. Your health care provider may recommend that you work with a dietitian to make a meal plan that is best for you. Your  meal plan may vary depending on factors such as: The calories you need. The medicines you take. Your weight. Your blood glucose, blood pressure, and cholesterol levels. Your activity level. Other health conditions you have, such as heart or kidney disease. How do carbohydrates affect me? Carbohydrates, also called carbs, affect your blood glucose level more than any other type of food. Eating carbs raises the amount of glucose in your blood. It is important to know how many carbs you can safely have in each meal. This is different for every person. Your dietitian can help you calculate how many carbs you should have at each meal and for each snack. How does alcohol affect me? Alcohol can cause a decrease in blood glucose (hypoglycemia), especially if you use insulin or take certain diabetes medicines by mouth. Hypoglycemia can be a life-threatening condition. Symptoms of hypoglycemia, such as sleepiness, dizziness, and confusion, are similar to symptoms of having too much alcohol. Do not drink alcohol if: Your health care provider tells you not to drink. You are pregnant, may be pregnant, or are planning to become pregnant. If you drink alcohol: Limit how much you have to: 0-1 drink a day for women. 0-2 drinks a day for men. Know how much alcohol is in your drink. In the [place] one drink equals one 12 oz bottle of beer (355 mL), one 5 oz glass of wine (148 mL), or one 1 oz glass of hard liquor (44 mL). Keep yourself hydrated with water, diet soda, or unsweetened iced tea. Keep in mind that regular soda, juice, and other mixers may contain a lot of sugar and must be counted as carbs. What  are tips for following this plan? Reading food labels Start by checking the serving size on the Nutrition Facts label of packaged foods and drinks. The number of calories and the amount of carbs, fats, and other nutrients listed on the label are based on one serving of the item. Many items contain more than  one serving per package. Check the total grams (g) of carbs in one serving. Check the number of grams of saturated fats and trans fats in one serving. Choose foods that have a low amount or none of these fats. Check the number of milligrams (mg) of salt (sodium) in one serving. Most people should limit total sodium intake to less than 2,300 mg per day. Always check the nutrition information of foods labeled as "low-fat" or "nonfat." These foods may be higher in added sugar or refined carbs and should be avoided. Talk to your dietitian to identify your daily goals for nutrients listed on the label. Shopping Avoid buying canned, pre-made, or processed foods. These foods tend to be high in fat, sodium, and added sugar. Shop around the outside edge of the grocery store. This is where you will most often find fresh fruits and vegetables, bulk grains, fresh meats, and fresh dairy products. Cooking Use low-heat cooking methods, such as baking, instead of high-heat cooking methods, such as deep frying. Cook using healthy oils, such as olive, canola, or sunflower oil. Avoid cooking with butter, cream, or high-fat meats. Meal planning Eat meals and snacks regularly, preferably at the same times every day. Avoid going long periods of time without eating. Eat foods that are high in fiber, such as fresh fruits, vegetables, beans, and whole grains. Eat 4-6 oz (112-168 g) of lean protein each day, such as lean meat, chicken, fish, eggs, or tofu. One ounce (oz) (28 g) of lean protein is equal to: 1 oz (28 g) of meat, chicken, or fish. 1 egg.  cup (62 g) of tofu. Eat some foods each day that contain healthy fats, such as avocado, nuts, seeds, and fish. What foods should I eat? Fruits Berries. Apples. Oranges. Peaches. Apricots. Plums. Grapes. Mangoes. Papayas. Pomegranates. Kiwi. Cherries. Vegetables Leafy greens, including lettuce, spinach, kale, chard, collard greens, mustard greens, and cabbage. Beets.  Cauliflower. Broccoli. Carrots. Green beans. Tomatoes. Peppers. Onions. Cucumbers. Brussels sprouts. Grains Whole grains, such as whole-wheat or whole-grain bread, crackers, tortillas, cereal, and pasta. Unsweetened oatmeal. Quinoa. Brown or wild rice. Meats and other proteins Seafood. Poultry without skin. Lean cuts of poultry and beef. Tofu. Nuts. Seeds. Dairy Low-fat or fat-free dairy products such as milk, yogurt, and cheese. The items listed above may not be a complete list of foods and beverages you can eat and drink. Contact a dietitian for more information. What foods should I avoid? Fruits Fruits canned with syrup. Vegetables Canned vegetables. Frozen vegetables with butter or cream sauce. Grains Refined white flour and flour products such as bread, pasta, snack foods, and cereals. Avoid all processed foods. Meats and other proteins Fatty cuts of meat. Poultry with skin. Breaded or fried meats. Processed meat. Avoid saturated fats. Dairy Full-fat yogurt, cheese, or milk. Beverages Sweetened drinks, such as soda or iced tea. The items listed above may not be a complete list of foods and beverages you should avoid. Contact a dietitian for more information. Questions to ask a health care provider Do I need to meet with a certified diabetes care and education specialist? Do I need to meet with a dietitian? What number can I call  if I have questions? When are the best times to check my blood glucose? Where to find more information: American Diabetes Association: diabetes.org Academy of Nutrition and Dietetics: eatright.[employer] of Diabetes and Digestive and Kidney Diseases: [employer]is Association of Diabetes Care & Education Specialists: diabeteseducator.org Summary It is important to have healthy eating habits because your blood sugar (glucose) levels are greatly affected by what you eat and drink. It is important to use alcohol carefully. A healthy meal plan  will help you manage your blood glucose and lower your risk of heart disease. Your health care provider may recommend that you work with a dietitian to make a meal plan that is best for you. This information is not intended to replace advice given to you by your health care provider. Make sure you discuss any questions you have with your health care provider. Document Revised: 11/23/2019 Document Reviewed: 11/23/2019 Elsevier Patient Education  2022 [place], MD [facility]

## 2021-05-06 ENCOUNTER — Encounter: Payer: Self-pay | Admitting: Hematology and Oncology

## 2021-05-09 ENCOUNTER — Ambulatory Visit: Payer: Medicare Other | Admitting: Internal Medicine

## 2021-05-09 ENCOUNTER — Encounter: Payer: Self-pay | Admitting: Hematology and Oncology

## 2021-05-13 ENCOUNTER — Ambulatory Visit (HOSPITAL_COMMUNITY)
Admission: RE | Admit: 2021-05-13 | Discharge: 2021-05-13 | Disposition: A | Discharge status: Home / Self Care | Payer: Medicare HMO | Source: Ambulatory Visit | Attending: Internal Medicine | Admitting: Internal Medicine

## 2021-05-13 ENCOUNTER — Other Ambulatory Visit: Payer: Self-pay

## 2021-05-13 DIAGNOSIS — G936 Cerebral edema: Secondary | ICD-10-CM | POA: Diagnosis not present

## 2021-05-13 DIAGNOSIS — C7931 Secondary malignant neoplasm of brain: Secondary | ICD-10-CM | POA: Diagnosis not present

## 2021-05-13 DIAGNOSIS — G9389 Other specified disorders of brain: Secondary | ICD-10-CM | POA: Diagnosis not present

## 2021-05-13 MED ORDER — GADOBUTROL 1 MMOL/ML IV SOLN
7.0000 mL | Freq: Once | INTRAVENOUS | Status: AC | PRN
  Administered 2021-05-13: 7 mL via INTRAVENOUS

## 2021-05-14 ENCOUNTER — Other Ambulatory Visit: Payer: Self-pay | Admitting: Radiation Therapy

## 2021-05-16 ENCOUNTER — Inpatient Hospital Stay: Payer: Medicare HMO | Attending: Hematology and Oncology | Admitting: Internal Medicine

## 2021-05-16 ENCOUNTER — Ambulatory Visit (HOSPITAL_BASED_OUTPATIENT_CLINIC_OR_DEPARTMENT_OTHER): Payer: Medicare HMO | Admitting: Hematology and Oncology

## 2021-05-16 ENCOUNTER — Other Ambulatory Visit: Payer: Self-pay

## 2021-05-16 ENCOUNTER — Inpatient Hospital Stay: Payer: Medicare HMO

## 2021-05-16 ENCOUNTER — Other Ambulatory Visit: Payer: Self-pay | Admitting: Hematology and Oncology

## 2021-05-16 VITALS — BP 151/70 | HR 77 | Temp 98.1°F | Resp 18 | Wt 156.9 lb

## 2021-05-16 DIAGNOSIS — C3490 Malignant neoplasm of unspecified part of unspecified bronchus or lung: Secondary | ICD-10-CM

## 2021-05-16 DIAGNOSIS — Z79899 Other long term (current) drug therapy: Secondary | ICD-10-CM | POA: Diagnosis not present

## 2021-05-16 DIAGNOSIS — Z5112 Encounter for antineoplastic immunotherapy: Secondary | ICD-10-CM | POA: Diagnosis not present

## 2021-05-16 DIAGNOSIS — Z95828 Presence of other vascular implants and grafts: Secondary | ICD-10-CM

## 2021-05-16 DIAGNOSIS — Z5111 Encounter for antineoplastic chemotherapy: Secondary | ICD-10-CM | POA: Insufficient documentation

## 2021-05-16 DIAGNOSIS — C7931 Secondary malignant neoplasm of brain: Secondary | ICD-10-CM

## 2021-05-16 DIAGNOSIS — C3411 Malignant neoplasm of upper lobe, right bronchus or lung: Secondary | ICD-10-CM | POA: Insufficient documentation

## 2021-05-16 DIAGNOSIS — E032 Hypothyroidism due to medicaments and other exogenous substances: Secondary | ICD-10-CM

## 2021-05-16 LAB — CBC WITH DIFFERENTIAL (CANCER CENTER ONLY)
Abs Immature Granulocytes: 0.02 10*3/uL (ref 0.00–0.07)
Basophils Absolute: 0 10*3/uL (ref 0.0–0.1)
Basophils Relative: 0 %
Eosinophils Absolute: 0 10*3/uL (ref 0.0–0.5)
Eosinophils Relative: 0 %
HCT: 34.7 % — ABNORMAL LOW (ref 39.0–52.0)
Hemoglobin: 11.5 g/dL — ABNORMAL LOW (ref 13.0–17.0)
Immature Granulocytes: 0 %
Lymphocytes Relative: 13 %
Lymphs Abs: 0.8 10*3/uL (ref 0.7–4.0)
MCH: 33 pg (ref 26.0–34.0)
MCHC: 33.1 g/dL (ref 30.0–36.0)
MCV: 99.7 fL (ref 80.0–100.0)
Monocytes Absolute: 0.6 10*3/uL (ref 0.1–1.0)
Monocytes Relative: 10 %
Neutro Abs: 4.9 10*3/uL (ref 1.7–7.7)
Neutrophils Relative %: 77 %
Platelet Count: 240 10*3/uL (ref 150–400)
RBC: 3.48 MIL/uL — ABNORMAL LOW (ref 4.22–5.81)
RDW: 12.6 % (ref 11.5–15.5)
WBC Count: 6.3 10*3/uL (ref 4.0–10.5)
nRBC: 0 % (ref 0.0–0.2)

## 2021-05-16 LAB — CMP (CANCER CENTER ONLY)
ALT: 33 U/L (ref 0–44)
AST: 20 U/L (ref 15–41)
Albumin: 4.2 g/dL (ref 3.5–5.0)
Alkaline Phosphatase: 68 U/L (ref 38–126)
Anion gap: 6 (ref 5–15)
BUN: 18 mg/dL (ref 8–23)
CO2: 30 mmol/L (ref 22–32)
Calcium: 9.3 mg/dL (ref 8.9–10.3)
Chloride: 106 mmol/L (ref 98–111)
Creatinine: 0.78 mg/dL (ref 0.61–1.24)
GFR, Estimated: >60 mL/min (ref >60)
Glucose, Bld: 170 mg/dL — ABNORMAL HIGH (ref 70–99)
Potassium: 3.9 mmol/L (ref 3.5–5.1)
Sodium: 142 mmol/L (ref 135–145)
Total Bilirubin: 0.3 mg/dL (ref 0.3–1.2)
Total Protein: 6.8 g/dL (ref 6.5–8.1)

## 2021-05-16 LAB — TSH: TSH: 1.496 u[IU]/mL (ref 0.320–4.118)

## 2021-05-16 MED ORDER — SODIUM CHLORIDE 0.9 % IV SOLN
200.0000 mg | Freq: Once | INTRAVENOUS | Status: AC
  Administered 2021-05-16: 200 mg via INTRAVENOUS
  Filled 2021-05-16: qty 8

## 2021-05-16 MED ORDER — HEPARIN SOD (PORK) LOCK FLUSH 100 UNIT/ML IV SOLN
500.0000 [IU] | Freq: Once | INTRAVENOUS | Status: AC | PRN
  Administered 2021-05-16: 500 [IU]

## 2021-05-16 MED ORDER — SODIUM CHLORIDE 0.9% FLUSH
10.0000 mL | INTRAVENOUS | Status: DC | PRN
  Administered 2021-05-16: 10 mL

## 2021-05-16 MED ORDER — CYANOCOBALAMIN 1000 MCG/ML IJ SOLN
1000.0000 ug | Freq: Once | INTRAMUSCULAR | Status: AC
  Administered 2021-05-16: 1000 ug via INTRAMUSCULAR
  Filled 2021-05-16: qty 1

## 2021-05-16 MED ORDER — PROCHLORPERAZINE MALEATE 10 MG PO TABS
10.0000 mg | ORAL_TABLET | Freq: Once | ORAL | Status: AC
  Administered 2021-05-16: 10 mg via ORAL
  Filled 2021-05-16: qty 1

## 2021-05-16 MED ORDER — SODIUM CHLORIDE 0.9% FLUSH
10.0000 mL | Freq: Once | INTRAVENOUS | Status: AC
  Administered 2021-05-16: 10 mL

## 2021-05-16 MED ORDER — SODIUM CHLORIDE 0.9 % IV SOLN: Freq: Once | INTRAVENOUS | Status: AC

## 2021-05-16 MED ORDER — SODIUM CHLORIDE 0.9 % IV SOLN
500.0000 mg/m2 | Freq: Once | INTRAVENOUS | Status: AC
  Administered 2021-05-16: 900 mg via INTRAVENOUS
  Filled 2021-05-16: qty 20

## 2021-05-16 NOTE — Patient Instructions (Signed)
[facility]  Discharge Instructions: Thank you for choosing [facility] to provide your oncology and hematology care.   If you have a lab appointment with the Cancer Center, please go directly to the Cancer Center and check in at the registration area.   Wear comfortable clothing and clothing appropriate for easy access to any Portacath or PICC line.   We strive to give you quality time with your provider. You may need to reschedule your appointment if you arrive late (15 or more minutes).  Arriving late affects you and other patients whose appointments are after yours.  Also, if you miss three or more appointments without notifying the office, you may be dismissed from the clinic at the provider's discretion.      For prescription refill requests, have your pharmacy contact our office and allow 72 hours for refills to be completed.    Today you received the following chemotherapy and/or immunotherapy agents Pembrolizumab (Keytruda) and Pemetrexed (Alimta)      To help prevent nausea and vomiting after your treatment, we encourage you to take your nausea medication as directed.  BELOW ARE SYMPTOMS THAT SHOULD BE REPORTED IMMEDIATELY: *FEVER GREATER THAN 100.4 F (38 C) OR HIGHER *CHILLS OR SWEATING *NAUSEA AND VOMITING THAT IS NOT CONTROLLED WITH YOUR NAUSEA MEDICATION *UNUSUAL SHORTNESS OF BREATH *UNUSUAL BRUISING OR BLEEDING *URINARY PROBLEMS (pain or burning when urinating, or frequent urination) *BOWEL PROBLEMS (unusual diarrhea, constipation, pain near the anus) TENDERNESS IN MOUTH AND THROAT WITH OR WITHOUT PRESENCE OF ULCERS (sore throat, sores in mouth, or a toothache) UNUSUAL RASH, SWELLING OR PAIN  UNUSUAL VAGINAL DISCHARGE OR ITCHING   Items with * indicate a potential emergency and should be followed up as soon as possible or go to the Emergency Department if any problems should occur.  Please show the CHEMOTHERAPY ALERT CARD or  IMMUNOTHERAPY ALERT CARD at check-in to the Emergency Department and triage nurse.  Should you have questions after your visit or need to cancel or reschedule your appointment, please contact [facility]  Dept: [phone]  and follow the prompts.  Office hours are 8:00 a.m. to 4:30 p.m. Monday - Friday. Please note that voicemails left after 4:00 p.m. may not be returned until the following business day.  We are closed weekends and major holidays. You have access to a nurse at all times for urgent questions. Please call the main number to the clinic Dept: [phone] and follow the prompts.   For any non-urgent questions, you may also contact your provider using MyChart. We now offer e-Visits for anyone 18 and older to request care online for non-urgent symptoms. For details visit [email or web address].   Also download the MyChart app! Go to the app store, search "MyChart", open the app, select [place], and log in with your MyChart username and password.  Due to Covid, a mask is required upon entering the hospital/clinic. If you do not have a mask, one will be given to you upon arrival. For doctor visits, patients may have 1 support person aged 18 or older with them. For treatment visits, patients cannot have anyone with them due to current Covid guidelines and our immunocompromised population.   

## 2021-05-16 NOTE — Progress Notes (Signed)
[facility] [place], [place] [address] [phone]   Interval Evaluation  Date of Service: 05/16/21 Patient Name: Benjamin Willis. Patient MRN: [id number] Patient DOB: 1946/04/25 Provider: Ventura Sellers, MD  Identifying Statement:  Benjamin Lada. is a 76 y.o. male with Brain metastases [facility]) - Plan: MR BRAIN W WO CONTRAST   Primary Cancer:  Oncologic History: Oncology History  Primary cancer of right upper lobe of lung ([place])  02/07/2019 Initial Diagnosis   Primary cancer of right upper lobe of lung ([place])   11/08/2020 Cancer Staging   Staging form: Lung, AJCC 8th Edition - Clinical: Stage IV (cT0, cN0, cM1) - Signed by Orson Slick, MD on 11/08/2020    Metastatic lung cancer (metastasis from lung to other site) [facility])  10/16/2020 Initial Diagnosis   Metastatic lung cancer (metastasis from lung to other site) [facility])   11/01/2020 Cancer Staging   Staging form: Lung, AJCC 8th Edition - Clinical stage from 11/01/2020: Stage IVB (cT0, cN0, cM1c) - Signed by Orson Slick, MD on 11/01/2020 Stage prefix: Initial diagnosis    12/14/2020 -  Chemotherapy   Patient is on Treatment Plan : LUNG CARBOplatin / Pemetrexed / Pembrolizumab q21d Induction x 4 cycles / Maintenance Pemetrexed + Pembrolizumab      CNS Oncologic History 10/30/20: Completes WBRT for 4 metastases ([place])  Interval History: Benjamin Life. presents today for follow up after recent MRI brain.  His left leg weakness has not changed from our prior assessment.  He continues walking around the home with cane assist only.  Uses rolling walker for longer distances.  Currently on decadron 34m daily.  Denies seizures or headaches.  [place] with Dr. DLorenso Courier     H+P (11/06/20) Benjamin Willis presented to neurologic attention on June 13th, 2022 with complaints of left sided weakness.  He describes new difficulty with walking and using  his left hand for daily tasks.  He did not have any falls, and remained independent (though Onset of symptoms was 1-2 weeks prior.  His lung cancer, previously early stage, had been treated with radiation in both 2020 and for recurrence in 2021.  He is scheduled to consult with Dr. DLorenso Courierfor further management of systemic disease.    Medications: Current Outpatient Medications on File Prior to Visit  Medication Sig Dispense Refill   albuterol (VENTOLIN HFA) 108 (90 Base) MCG/ACT inhaler Inhale 2 puffs into the lungs every 6 (six) hours as needed for wheezing or shortness of breath. 18 g 6   amLODipine (NORVASC) 10 MG tablet Take 1 tablet by mouth once daily 90 tablet 0   ammonium lactate (AMLACTIN) 12 % lotion Apply 1 application topically as needed for dry skin. 400 g 0   blood glucose meter kit and supplies KIT Dispense based on patient and insurance preference. Use up to four times daily as directed. 1 each 0   Blood Pressure Monitoring (BLOOD PRESSURE MONITOR/ARM) DEVI Check BP once a day. DX I 10 1 each 0   dexamethasone (DECADRON) 2 MG tablet Take 1 tablet (2 mg total) by mouth daily. 60 tablet 1   erythromycin ophthalmic ointment Place 1 application into the left eye at bedtime. 3.5 g 0   folic acid (FOLVITE) 1 MG tablet Take 1 tablet (1 mg total) by mouth daily. 90 tablet 1   glipiZIDE (GLUCOTROL) 5 MG tablet TAKE 1 TABLET BY MOUTH TWICE  DAILY WITH A MEAL 180 tablet 0   latanoprost (XALATAN) 0.005 % ophthalmic solution Place 1 drop into both eyes at bedtime. 2.5 mL 3   levETIRAcetam (KEPPRA) 1000 MG tablet Take 1 tablet (1,000 mg total) by mouth 2 (two) times daily. 180 tablet 1   lidocaine-prilocaine (EMLA) cream Apply 1 application topically as needed. (Patient taking differently: Apply 1 application topically as needed (for port before tx).) 30 g 0   metFORMIN (GLUCOPHAGE) 500 MG tablet TAKE 1 TABLET BY MOUTH TWICE DAILY WITH MEALS (Patient taking differently: Take 500 mg by mouth daily  with breakfast.) 180 tablet 0   Multiple Vitamins-Minerals (MULTIVITAMIN MEN) TABS Take 1 tablet by mouth daily.     ondansetron (ZOFRAN) 8 MG tablet Take 1 tablet (8 mg total) by mouth every 8 (eight) hours as needed for nausea or vomiting. 30 tablet 0   pantoprazole (PROTONIX) 40 MG tablet Take 1 tablet (40 mg total) by mouth daily. 90 tablet 1   potassium chloride SA (KLOR-CON) 20 MEQ tablet Take 20 mEq by mouth daily.     prochlorperazine (COMPAZINE) 10 MG tablet Take 1 tablet (10 mg total) by mouth every 6 (six) hours as needed for nausea or vomiting. 30 tablet 0   rosuvastatin (CRESTOR) 20 MG tablet Take 1 tablet (20 mg total) by mouth daily. 90 tablet 1   sitaGLIPtin (JANUVIA) 100 MG tablet Take 1 tablet (100 mg total) by mouth daily. 90 tablet 3   allopurinol (ZYLOPRIM) 100 MG tablet Take 100 mg by mouth daily. (Patient not taking: Reported on 05/01/2021)     amLODipine-benazepril (LOTREL) 10-20 MG capsule Take 1 capsule by mouth daily. (Patient not taking: Reported on 05/01/2021)     cetirizine (ZYRTEC) 10 MG tablet Take 1 tablet (10 mg total) by mouth daily. (Patient not taking: Reported on 05/01/2021) 30 tablet 11   furosemide (LASIX) 20 MG tablet Take 20 mg by mouth daily. (Patient not taking: Reported on 05/01/2021)     gabapentin (NEURONTIN) 300 MG capsule Take 1 capsule (300 mg total) by mouth at bedtime. 90 capsule 1   lisinopril (ZESTRIL) 40 MG tablet Take 1 tablet (40 mg total) by mouth daily. (Patient not taking: Reported on 05/01/2021) 90 tablet 3   No current facility-administered medications on file prior to visit.    Allergies: No Known Allergies Past Medical History:  Past Medical History:  Diagnosis Date   Allergy    Diabetes mellitus without complication ([place])    Type II   Glaucoma 10/16/2020   History of radiation therapy    [place]- SBRT 02/21/19-02/28/19, Rt Lung- IMRT 02/29/20-04/16/20 Dr. Gery Pray   History of radiation therapy 10/30/2020   whole brain  10/17/2020-10/30/2020 Dr Gery Pray   Hyperlipidemia    Hypertension    Lung cancer ([place])    Stage IA2 (cT1b, N0) non-small cell lung cancer (adenocarcinoma)   Past Surgical History:  Past Surgical History:  Procedure Laterality Date   ELECTROMAGNETIC NAVIGATION BROCHOSCOPY Right 02/02/2019   Procedure: VIDEO BRONCHOSCOPY WITH NAVIGATION AND FIDUCIAL PLACEMENT;  Surgeon: Garner Nash, DO;  Location: [facility];  Service: Cardiopulmonary;  Laterality: Right;   FUDUCIAL PLACEMENT Right 02/02/2019   Procedure: Placement Of Fuducial Right Upper Lobe;  Surgeon: Garner Nash, DO;  Location: [facility];  Service: Cardiopulmonary;  Laterality: Right;   IR IMAGING GUIDED PORT INSERTION  12/13/2020   none     VIDEO BRONCHOSCOPY WITH RADIAL ENDOBRONCHIAL ULTRASOUND Right 02/02/2019   Procedure: Video Bronchoscopy With Radial  Endobronchial Ultrasound;  Surgeon: Garner Nash, DO;  Location: [facility];  Service: Cardiopulmonary;  Laterality: Right;   Social History:  Social History   Socioeconomic History   Marital status: Single    Spouse name: Not on file   Number of children: 0   Years of education: Not on file   Highest education level: 9th grade  Occupational History   Not on file  Tobacco Use   Smoking status: Former    Packs/day: 1.00    Years: 48.00    Pack years: 48.00    Types: Cigarettes    Quit date: 02/20/2015    Years since quitting: 6.2   Smokeless tobacco: Former    Quit date: 10/2013  Vaping Use   Vaping Use: Never used  Substance and Sexual Activity   Alcohol use: No   Drug use: No   Sexual activity: Yes    Birth control/protection: Condom  Other Topics Concern   Not on file  Social History Narrative   Not on file   Social Determinants of Health   Financial Resource Strain: Not on file  Food Insecurity: Not on file  Transportation Needs: Not on file  Physical Activity: Not on file  Stress: Not on file  Social Connections: Not on file  Intimate Partner  Violence: Not on file   Family History:  Family History  Problem Relation Age of Onset   Lung cancer Neg Hx     Review of Systems: Constitutional: Doesn't report fevers, chills or abnormal weight loss Eyes: Doesn't report blurriness of vision Ears, nose, mouth, throat, and face: Doesn't report sore throat Respiratory: Doesn't report cough, dyspnea or wheezes Cardiovascular: Doesn't report palpitation, chest discomfort  Gastrointestinal:  Doesn't report nausea, constipation, diarrhea GU: Doesn't report incontinence Skin: Doesn't report skin rashes Neurological: Per HPI Musculoskeletal: Doesn't report joint pain Behavioral/Psych: Doesn't report anxiety  Physical Exam: Vitals:   05/16/21 1125  BP: (!) 151/70  Pulse: 77  Resp: 18  Temp: 98.1 F (36.7 C)  SpO2: 100%    KPS: 90. General: Alert, cooperative, pleasant, in no acute distress Head: Normal EENT: No conjunctival injection or scleral icterus.  Lungs: Resp effort normal Cardiac: Regular rate Abdomen: Non-distended abdomen Skin: No rashes cyanosis or petechiae. Extremities: No clubbing or edema  Neurologic Exam: Mental Status: Awake, alert, attentive to examiner. Oriented to self and environment. Language is fluent with intact comprehension.  Cranial Nerves: Visual acuity is grossly normal. Visual fields are full. Extra-ocular movements intact. No ptosis. Face is symmetric Motor: Tone and bulk are normal. Left leg 4/5. Reflexes are symmetric, no pathologic reflexes present.  Sensory: Intact to light touch Gait: Hemiparetic   Labs: I have reviewed the data as listed    Component Value Date/Time   NA 142 04/19/2021 1331   NA 143 12/06/2019 0949   K 3.8 04/19/2021 1331   CL 107 04/19/2021 1331   CO2 26 04/19/2021 1331   GLUCOSE 148 (H) 04/19/2021 1331   BUN 13 04/19/2021 1331   BUN 16 12/06/2019 0949   CREATININE 0.80 04/19/2021 1331   CALCIUM 8.7 (L) 04/19/2021 1331   PROT 6.7 04/19/2021 1331   PROT 6.4  12/06/2019 0949   ALBUMIN 3.7 04/19/2021 1331   ALBUMIN 4.1 12/06/2019 0949   AST 13 (L) 04/19/2021 1331   ALT 15 04/19/2021 1331   ALKPHOS 67 04/19/2021 1331   BILITOT 0.3 04/19/2021 1331   GFRNONAA >60 04/19/2021 1331   GFRAA 101 12/06/2019 0949   Lab  Results  Component Value Date   WBC 6.6 04/19/2021   NEUTROABS 5.6 04/19/2021   HGB 10.8 (L) 04/19/2021   HCT 32.8 (L) 04/19/2021   MCV 98.8 04/19/2021   PLT 277 04/19/2021   Imaging:  [facility] Clinician Interpretation: I have personally reviewed the CNS images as listed.  My interpretation, in the context of the patient's clinical presentation, is treatment effect vs true progression  MR BRAIN W WO CONTRAST  Result Date: 05/14/2021 CLINICAL DATA:  Brain metastases ([place]) C79.31 (ICD-10-CM). Brain/CNS neoplasm, assess treatment response. EXAM: MRI HEAD WITHOUT AND WITH CONTRAST TECHNIQUE: Multiplanar, multiecho pulse sequences of the brain and surrounding structures were obtained without and with intravenous contrast. CONTRAST:  49m GADAVIST GADOBUTROL 1 MMOL/ML IV SOLN COMPARISON:  MRI of the brain February 02, 2021. FINDINGS: Brain: Again seen are 4 enhancing lesions consistent with metastases, as described below. Enlarged: Right posterior frontal lobe, 17 mm (15 mm on prior), series 16, image 111. Right parietal lobe in the precuneus, 14 mm (10 mm on prior), series 16, image 86. Stable or smaller: Left occipital lobe, 11 mm (11 mm on prior), series 16, image 62. Medial right cerebellar hemisphere, 10 mm (12 mm on prior) series 16, image 32. No new enhancing lesion identified. There is increase in the vasogenic edema surrounding the right frontal, right parietal and left occipital lesions. Scattered and confluent T2 hyperintensity within the white matter of the cerebral hemispheres head significantly progressed from prior MRI and is likely treatment related. Vascular: Normal flow voids. Skull and upper cervical spine: Normal marrow signal.  Sinuses/Orbits: Mild mucosal thickening of the ethmoid cells and left maxillary sinus. The orbits are maintained. Other: Bilateral mastoid effusion. IMPRESSION: 1. Mixed treatment response with increase in size of 2 lesions, 1 stable lesion and 1 lesion smaller when compared to prior MRI. 2. Progression of T2 hyperintensity within the supratentorial white matter, likely treatment related. Electronically Signed   By: KPedro EarlsM.D.   On: 05/14/2021 09:10    Assessment/Plan Brain metastases ([facility] - Plan: MR BRAIN W WO CONTRAST  Benjamin Willis is clinically stable today, still with hemiparesis.  Brain MRI demonstrates progression of 2/4 treated metastases, both with right hemisphere.  There remains substantial edema around the lesions, despite consistent corticosteroids.  Etiology is organic tumor progression vs radionecrosis.  Positive response to decadron and degree of edema favor treatment effect, but whole brain dosing regimen (rather than radiosurgery) is less supportive of radiation necrosis.  We recommended repeating MRI brain, this time with SRS protocol, in 2 months.  In the meantime he will remain on decadron 211mdaily.  He understands he needs to call usKoreammediately if left leg weakness worsens prior to the 2 months interval.    Keppra will remain at [id number].   We ask that McC[place]elope[place]urn to clinic following next brain MRI, or sooner as needed.  Case will be reviewed additionally in brain/spine tumor board next week.  We appreciate the opportunity to participate in the care of McCWintergreenAll questions were answered. The patient knows to call the clinic with any problems, questions or concerns. No barriers to learning were detected.  The total time spent in the encounter was 30 minutes and more than 50% was on counseling and review of test results   ZacVentura SellersD Medical Director of Neuro-Oncology [facility] [place]/12/23 12:02 PM

## 2021-05-19 ENCOUNTER — Encounter: Payer: Self-pay | Admitting: Hematology and Oncology

## 2021-05-19 NOTE — Progress Notes (Signed)
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  This was followed by an MRI of the brain with and without contrast which showed 4 brain lesions consistent with metastases, extensive vasogenic edema associated with the posterior right frontal lesion without midline shift. 6/15-6/28/2022: 10 fractions of WBRT with Rad/Onc 11/23/2020: Guardant 360 returns with no targetable mutation.  12/14/2020: Cycle 1 Day 1 of Carbo/Pem/Pem 01/04/2021: Cycle 2 Day 1 of Carbo/Pem/Pem 01/25/2021: Cycle 3 Day 1 of Carbo/Pem/Pem 02/15/2021: Cycle 4 Day 1 of Carbo/Pem/Pem 03/08/2021: Cycle 5 Day 1 of maintenance Pem/Pem 03/29/2021: Cycle 6 Day 1 of maintenance Pem/Pem 04/19/2021: Cycle 7 Day 1 of maintenance Pem/Pem 05/16/2021: Cycle 8 Day 1 of maintenance Pem/Pem  Interval History:  Benjamin Willis. 76 y.o. 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 The patient's last visit was on 04/19/2021.  In the interim since the last visit he has completed Cycle 7 of maintenance chemotherapy. He presents today for Cycle 8 Day 1.   On exam today Mrs. Turnage is unaccompanied.  He reports he has "been doing good".  In the interim since her last visit.  He notes he is not having any nausea, ming, or diarrhea.  He reports  that his energy levels are strong.  He notes that he has not been having headache, vision changes, fevers, chills, sweats, nausea, vomiting or diarrhea.  He is eating well and had a normal bowel movement this morning.  Overall he is tolerating treatment has not noticed any side effects as result of his medications.  He has no questions concerns or complaints today.  A full 10 point ROS is listed below.  MEDICAL HISTORY:  Past Medical History:  Diagnosis Date   Allergy    Diabetes mellitus without complication ([place])    Type II   Glaucoma 10/16/2020   History of radiation therapy    [place]- SBRT 02/21/19-02/28/19, Rt Lung- IMRT 02/29/20-04/16/20 Dr. Gery Pray   History of radiation therapy 10/30/2020   whole brain 10/17/2020-10/30/2020 Dr Gery Pray   Hyperlipidemia    Hypertension    Lung cancer ([place])    Stage IA2 (cT1b, N0) non-small cell lung cancer (adenocarcinoma)    SURGICAL HISTORY: Past Surgical History:  Procedure Laterality Date   ELECTROMAGNETIC NAVIGATION BROCHOSCOPY Right 02/02/2019   Procedure: VIDEO BRONCHOSCOPY WITH NAVIGATION AND FIDUCIAL PLACEMENT;  Surgeon: Garner Nash, DO;  Location: [facility];  Service: Cardiopulmonary;  Laterality: Right;   FUDUCIAL PLACEMENT Right 02/02/2019   Procedure: Placement Of Fuducial Right Upper Lobe;  Surgeon: Garner Nash, DO;  Location: [facility];  Service: Cardiopulmonary;  Laterality: Right;   IR IMAGING GUIDED PORT INSERTION  12/13/2020   none     VIDEO BRONCHOSCOPY WITH RADIAL ENDOBRONCHIAL ULTRASOUND Right 02/02/2019   Procedure: Video Bronchoscopy With Radial Endobronchial Ultrasound;  Surgeon: Garner Nash, DO;  Location: [facility];  Service: Cardiopulmonary;  Laterality: Right;  SOCIAL HISTORY: Social History   Socioeconomic History   Marital status: Single    Spouse name: Not on file   Number of children: 0   Years of education: Not on file   Highest education level: 9th grade  Occupational History   Not on file   Tobacco Use   Smoking status: Former    Packs/day: 1.00    Years: 48.00    Pack years: 48.00    Types: Cigarettes    Quit date: 02/20/2015    Years since quitting: 6.2   Smokeless tobacco: Former    Quit date: 10/2013  Vaping Use   Vaping Use: Never used  Substance and Sexual Activity   Alcohol use: No   Drug use: No   Sexual activity: Yes    Birth control/protection: Condom  Other Topics Concern   Not on file  Social History Narrative   Not on file   Social Determinants of Health   Financial Resource Strain: Not on file  Food Insecurity: Not on file  Transportation Needs: Not on file  Physical Activity: Not on file  Stress: Not on file  Social Connections: Not on file  Intimate Partner Violence: Not on file    FAMILY HISTORY: Family History  Problem Relation Age of Onset   Lung cancer Neg Hx     ALLERGIES:  has No Known Allergies.  MEDICATIONS:  Current Outpatient Medications  Medication Sig Dispense Refill   albuterol (VENTOLIN HFA) 108 (90 Base) MCG/ACT inhaler Inhale 2 puffs into the lungs every 6 (six) hours as needed for wheezing or shortness of breath. 18 g 6   allopurinol (ZYLOPRIM) 100 MG tablet Take 100 mg by mouth daily. (Patient not taking: Reported on 05/01/2021)     amLODipine (NORVASC) 10 MG tablet Take 1 tablet by mouth once daily 90 tablet 0   amLODipine-benazepril (LOTREL) 10-20 MG capsule Take 1 capsule by mouth daily. (Patient not taking: Reported on 05/01/2021)     ammonium lactate (AMLACTIN) 12 % lotion Apply 1 application topically as needed for dry skin. 400 g 0   blood glucose meter kit and supplies KIT Dispense based on patient and insurance preference. Use up to four times daily as directed. 1 each 0   Blood Pressure Monitoring (BLOOD PRESSURE MONITOR/ARM) DEVI Check BP once a day. DX I 10 1 each 0   cetirizine (ZYRTEC) 10 MG tablet Take 1 tablet (10 mg total) by mouth daily. (Patient not taking: Reported on 05/01/2021) 30 tablet 11    dexamethasone (DECADRON) 2 MG tablet Take 1 tablet (2 mg total) by mouth daily. 60 tablet 1   erythromycin ophthalmic ointment Place 1 application into the left eye at bedtime. 3.5 g 0   folic acid (FOLVITE) 1 MG tablet Take 1 tablet (1 mg total) by mouth daily. 90 tablet 1   furosemide (LASIX) 20 MG tablet Take 20 mg by mouth daily. (Patient not taking: Reported on 05/01/2021)     gabapentin (NEURONTIN) 300 MG capsule Take 1 capsule (300 mg total) by mouth at bedtime. 90 capsule 1   glipiZIDE (GLUCOTROL) 5 MG tablet TAKE 1 TABLET BY MOUTH TWICE DAILY WITH A MEAL 180 tablet 0   latanoprost (XALATAN) 0.005 % ophthalmic solution Place 1 drop into both eyes at bedtime. 2.5 mL 3   levETIRAcetam (KEPPRA) 1000 MG tablet Take 1 tablet (1,000 mg total) by mouth 2 (two) times daily. 180 tablet 1   lidocaine-prilocaine (EMLA) cream Apply 1 application topically as needed. (Patient  taking differently: Apply 1 application topically as needed (for port before tx).) 30 g 0   lisinopril (ZESTRIL) 40 MG tablet Take 1 tablet (40 mg total) by mouth daily. (Patient not taking: Reported on 05/01/2021) 90 tablet 3   metFORMIN (GLUCOPHAGE) 500 MG tablet TAKE 1 TABLET BY MOUTH TWICE DAILY WITH MEALS (Patient taking differently: Take 500 mg by mouth daily with breakfast.) 180 tablet 0   Multiple Vitamins-Minerals (MULTIVITAMIN MEN) TABS Take 1 tablet by mouth daily.     ondansetron (ZOFRAN) 8 MG tablet Take 1 tablet (8 mg total) by mouth every 8 (eight) hours as needed for nausea or vomiting. 30 tablet 0   pantoprazole (PROTONIX) 40 MG tablet Take 1 tablet (40 mg total) by mouth daily. 90 tablet 1   potassium chloride SA (KLOR-CON) 20 MEQ tablet Take 20 mEq by mouth daily.     prochlorperazine (COMPAZINE) 10 MG tablet Take 1 tablet (10 mg total) by mouth every 6 (six) hours as needed for nausea or vomiting. 30 tablet 0   rosuvastatin (CRESTOR) 20 MG tablet Take 1 tablet (20 mg total) by mouth daily. 90 tablet 1    sitaGLIPtin (JANUVIA) 100 MG tablet Take 1 tablet (100 mg total) by mouth daily. 90 tablet 3   No current facility-administered medications for this visit.    REVIEW OF SYSTEMS:   Constitutional: ( - ) fevers, ( - )  chills , ( - ) night sweats Eyes: ( - ) blurriness of vision, ( - ) double vision, ( - ) watery eyes Ears, nose, mouth, throat, and face: ( - ) mucositis, ( - ) sore throat Respiratory: ( - ) cough, ( - ) dyspnea, ( - ) wheezes Cardiovascular: ( - ) palpitation, ( - ) chest discomfort, ( - ) lower extremity swelling Gastrointestinal:  ( - ) nausea, ( - ) heartburn, ( - ) change in bowel habits Skin: ( - ) abnormal skin rashes Lymphatics: ( - ) new lymphadenopathy, ( - ) easy bruising Neurological: ( - ) numbness, ( - ) tingling, ( - ) new weaknesses Behavioral/Psych: ( - ) mood change, ( - ) new changes  All other systems were reviewed with the patient and are negative.  PHYSICAL EXAMINATION:  There were no vitals filed for this visit.   There were no vitals filed for this visit.   GENERAL: Well-appearing elderly African-American male, alert, no distress and comfortable SKIN:  Skin color, texture, turgor are normal, no rashes or significant lesions EYES: conjunctiva are pink and non-injected, sclera clear LUNGS: clear to auscultation and percussion with normal breathing effort HEART: regular rate & rhythm and no murmurs and no lower extremity edema PSYCH: alert & oriented x 3, fluent speech NEURO: No focal deficits. No facial droop.   LABORATORY DATA:  I have reviewed the data as listed CBC Latest Ref Rng & Units 05/16/2021 04/19/2021 03/29/2021  WBC 4.0 - 10.5 K/uL 6.3 6.6 4.4  Hemoglobin 13.0 - 17.0 g/dL 11.5(L) 10.8(L) 10.7(L)  Hematocrit 39.0 - 52.0 % 34.7(L) 32.8(L) 32.3(L)  Platelets 150 - 400 K/uL 240 277 367    CMP Latest Ref Rng & Units 05/16/2021 04/19/2021 03/29/2021  Glucose 70 - 99 mg/dL 170(H) 148(H) 104(H)  BUN 8 - 23 mg/dL $Remove'18 13 9  'stHfXuC$ Creatinine  0.61 - 1.24 mg/dL 0.78 0.80 0.67  Sodium 135 - 145 mmol/L 142 142 143  Potassium 3.5 - 5.1 mmol/L 3.9 3.8 3.9  Chloride 98 - 111 mmol/L 106 107 108  CO2 22 - 32 mmol/L $RemoveB'30 26 26  'SCfiossr$ Calcium 8.9 - 10.3 mg/dL 9.3 8.7(L) 9.2  Total Protein 6.5 - 8.1 g/dL 6.8 6.7 6.7  Total Bilirubin 0.3 - 1.2 mg/dL 0.3 0.3 0.3  Alkaline Phos 38 - 126 U/L 68 67 79  AST 15 - 41 U/L 20 13(L) 23  ALT 0 - 44 U/L 33 15 15    RADIOGRAPHIC STUDIES:  MR BRAIN W WO CONTRAST  Result Date: 05/14/2021 CLINICAL DATA:  Brain metastases (HCC) C79.31 (ICD-10-CM). Brain/CNS neoplasm, assess treatment response. EXAM: MRI HEAD WITHOUT AND WITH CONTRAST TECHNIQUE: Multiplanar, multiecho pulse sequences of the brain and surrounding structures were obtained without and with intravenous contrast. CONTRAST:  69mL GADAVIST GADOBUTROL 1 MMOL/ML IV SOLN COMPARISON:  MRI of the brain February 02, 2021. FINDINGS: Brain: Again seen are 4 enhancing lesions consistent with metastases, as described below. Enlarged: Right posterior frontal lobe, 17 mm (15 mm on prior), series 16, image 111. Right parietal lobe in the precuneus, 14 mm (10 mm on prior), series 16, image 86. Stable or smaller: Left occipital lobe, 11 mm (11 mm on prior), series 16, image 62. Medial right cerebellar hemisphere, 10 mm (12 mm on prior) series 16, image 32. No new enhancing lesion identified. There is increase in the vasogenic edema surrounding the right frontal, right parietal and left occipital lesions. Scattered and confluent T2 hyperintensity within the white matter of the cerebral hemispheres head significantly progressed from prior MRI and is likely treatment related. Vascular: Normal flow voids. Skull and upper cervical spine: Normal marrow signal. Sinuses/Orbits: Mild mucosal thickening of the ethmoid cells and left maxillary sinus. The orbits are maintained. Other: Bilateral mastoid effusion. IMPRESSION: 1. Mixed treatment response with increase in size of 2 lesions, 1  stable lesion and 1 lesion smaller when compared to prior MRI. 2. Progression of T2 hyperintensity within the supratentorial white matter, likely treatment related. Electronically Signed   By: Pedro Earls M.D.   On: 05/14/2021 09:10    ASSESSMENT & PLAN Benjamin Willis. 76 y.o. 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  After review the labs, reviewed records, discussion with the patient the findings are most consistent with metastatic adenocarcinoma with spread to the brain.  Unfortunately we do not have new pathology as the lesions in his brain were not resectable and he underwent radiation therapy instead.  We can presume based off his prior pathology that this represents the original adenocarcinoma.  As such I do believe it is reasonable to proceed with carboplatin, pembrolizumab, pemetrexed therapy in order to help treat any possible micrometastatic disease.  The patient also underwent whole brain radiation therapy instead of SRS.  Therefore I do believe systemic therapy will be reasonable.  This plan of care was discussed with the resident lung expert Dr. Julien Nordmann. Additionally our [place] 360 testing was negative for a targetable mutation.   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 Lost to follow up --presents with brain metastasis only. No evidence of disease in the C/A/P noted on CT scan from 03/05/2021.  --Patient completed whole brain radiation, but did not wish to undergo SRS.  Radiation oncology noted that this may be an option in the future if he were to have recurrence in the brain --Guardant 360 on 11/01/2020 showed no  targetable mutations.  Plan: --Labs today were reviewed. No intervention required and adequate to proceed with treatment.  --plan to  proceed with maintenance Pem/Pem chemotherapy, Cycle 9 Day 1 today. --Plan for CT in 3 months (Feb 2023) --continue to follow with neuro-oncology for brain metastasis --RTC in 3 weeks for Cycle 9 of chemotherapy with maintenance pembrolizumab/pemetrexed.   #Left leg weakness --Improved with corticosteroid therapy, continue on dexamethasone 2 mg daily  --MRI ordered by neurology.  --Continue to monitor.  #Chemotherapy Induced Anemia-stable #Chemotherapy Induced Leukopenia-improved  --today WBC 6.6 and Hgb 11.5. ANC 4.9. --continue to monitor   #Supportive Care -- chemotherapy education complete  -- port placed --daily PO 1mg  folic acid with pemetrexed. -- zofran 8mg  q8H PRN and compazine 10mg  PO q6H for nausea -- EMLA cream for port -- no pain medication required at this time.   Orders Placed This Encounter  Procedures   CT CHEST ABDOMEN PELVIS W CONTRAST    Standing Status:   Future    Standing Expiration Date:   05/19/2022    Order Specific Question:   Preferred imaging location?    Answer:   [facility]    Order Specific Question:   Is Oral Contrast requested for this exam?    Answer:   Yes, Per Radiology protocol     All questions were answered. The patient knows to call the clinic with any problems, questions or concerns.  I have spent a total of 30 minutes minutes of face-to-face and non-face-to-face time, preparing to see the patient, performing a medically appropriate examination, counseling and educating the patient, ordering medications, referring and communicating with other health care professionals, documenting clinical information in the electronic health record, and care coordination.   Ledell Peoples, MD Department of Hematology/Oncology [place] [facility] Phone: [phone] Pager: [phone] Email: [email or web address]   05/19/2021 3:50 PM

## 2021-05-20 ENCOUNTER — Inpatient Hospital Stay: Payer: Medicare HMO

## 2021-05-22 ENCOUNTER — Ambulatory Visit (INDEPENDENT_AMBULATORY_CARE_PROVIDER_SITE_OTHER): Payer: Medicare HMO | Admitting: Podiatry

## 2021-05-22 ENCOUNTER — Other Ambulatory Visit: Payer: Self-pay

## 2021-05-22 DIAGNOSIS — E119 Type 2 diabetes mellitus without complications: Secondary | ICD-10-CM

## 2021-05-22 DIAGNOSIS — M79609 Pain in unspecified limb: Secondary | ICD-10-CM

## 2021-05-22 DIAGNOSIS — M2012 Hallux valgus (acquired), left foot: Secondary | ICD-10-CM

## 2021-05-22 DIAGNOSIS — B351 Tinea unguium: Secondary | ICD-10-CM

## 2021-05-22 NOTE — Progress Notes (Signed)
This patient returns to my office for at risk foot care.  This patient requires this care by a professional since this patient will be at risk due to having diabetic neuropathy.  This patient is unable to cut nails himself since the patient cannot reach his nails.These nails are painful walking and wearing shoes.  This patient presents for at risk foot care today.  General Appearance  Alert, conversant and in no acute stress.  Vascular  Dorsalis pedis and posterior tibial  pulses are palpable  bilaterally.  Capillary return is within normal limits  bilaterally. Temperature is within normal limits  bilaterally.  Neurologic  Senn-Weinstein monofilament wire test within normal limits /diminished  bilaterally. Muscle power within normal limits bilaterally.  Nails Thick disfigured discolored nails with subungual debris  from hallux to fifth toes bilaterally. No evidence of bacterial infection or drainage bilaterally.  Orthopedic  No limitations of motion  feet .  No crepitus or effusions noted.  No bony pathology or digital deformities noted.  HAV  B/L.  Skin  normotropic skin with no porokeratosis noted bilaterally.  No signs of infections or ulcers noted.     Onychomycosis  Pain in right toes  Pain in left toes  Consent was obtained for treatment procedures.   Mechanical debridement of nails 1-5  bilaterally performed with a nail nipper.  Filed with dremel without incident.  Patient qualifies for diabetic shoes due to DPN and HAV  B/L. To make an appointment with pedorthist lab.   Return office visit   3 months                  Told patient to return for periodic foot care and evaluation due to potential at risk complications.  Patient requests new diabetic shoes for this year.   Gardiner Barefoot DPM

## 2021-05-28 ENCOUNTER — Other Ambulatory Visit: Payer: Self-pay | Admitting: Emergency Medicine

## 2021-06-05 ENCOUNTER — Encounter: Payer: Self-pay | Admitting: Hematology and Oncology

## 2021-06-06 ENCOUNTER — Other Ambulatory Visit: Payer: Medicare HMO | Admitting: Internal Medicine

## 2021-06-06 ENCOUNTER — Other Ambulatory Visit: Payer: Self-pay

## 2021-06-06 ENCOUNTER — Other Ambulatory Visit: Payer: Self-pay | Admitting: Emergency Medicine

## 2021-06-06 ENCOUNTER — Encounter: Payer: Self-pay | Admitting: Internal Medicine

## 2021-06-06 VITALS — BP 140/72 | HR 75 | Temp 98.2°F

## 2021-06-06 DIAGNOSIS — I10 Essential (primary) hypertension: Secondary | ICD-10-CM

## 2021-06-06 DIAGNOSIS — C7931 Secondary malignant neoplasm of brain: Secondary | ICD-10-CM

## 2021-06-06 DIAGNOSIS — E119 Type 2 diabetes mellitus without complications: Secondary | ICD-10-CM

## 2021-06-06 DIAGNOSIS — Z515 Encounter for palliative care: Secondary | ICD-10-CM

## 2021-06-06 DIAGNOSIS — E1159 Type 2 diabetes mellitus with other circulatory complications: Secondary | ICD-10-CM

## 2021-06-06 DIAGNOSIS — I152 Hypertension secondary to endocrine disorders: Secondary | ICD-10-CM

## 2021-06-06 DIAGNOSIS — C3490 Malignant neoplasm of unspecified part of unspecified bronchus or lung: Secondary | ICD-10-CM

## 2021-06-06 MED ORDER — METFORMIN HCL 500 MG PO TABS: 500.0000 mg | ORAL_TABLET | Freq: Every day | ORAL | 3 refills | Status: DC

## 2021-06-06 NOTE — Progress Notes (Signed)
[facility] Pall[facility] Care Follow-Up Visit Telephone: [phone]  Fax: [phone]   Date of encounter: 06/06/21 8:51 AM PATIENT NAME: Benjamin Willis [address] [place] [address]   [phone] (home)  DOB: 1946/01/20 MRN: [id number] PRIMARY CARE PROVIDER:    Horald Pollen, MD,  [place] [address] [phone]  REFERRING PROVIDER:   Horald Pollen, MD [place],  [place] [address] [phone]  RESPONSIBLE PARTY:    Contact Information     Name Relation Home Work [place] Sister [phone]  [phone]        I met face to face with patient and family in his home. Palliative Care was asked to follow this patient by consultation request of  Benjamin Willis, * to address advance care planning and complex medical decision making. This is follow-up visit.                                     ASSESSMENT AND PLAN / RECOMMENDATIONS:   Advance Care Planning/Goals of Care: Goals include to maximize quality of life and symptom management. Patient/health care surrogate gave his/her permission to discuss.Our advance care planning conversation included a discussion about:    The value and importance of advance care planning  Experiences with loved ones who have been seriously ill or have died  Exploration of personal, cultural or spiritual beliefs that might influence medical decisions  Exploration of goals of care in the event of a sudden injury or illness  Identification  of a healthcare agent  Review and updating or creation of an  advance directive document . Decision not to resuscitate or to de-escalate disease focused treatments due to poor prognosis. CODE STATUS:   FULL CODE; prior notes in [facility] EMR--pt's sister, Benjamin Willis, helps with history and his care.  She previously said she was going to get the living will and HCPOA notarized through the cancer center and  advised that I did not need to be involved with that.  Symptom Management/Plan: 1. Lung cancer metastatic to brain [facility]) -tolerating his chemotherapy well and likes going to the infusion center where he gets snacks and lots of attention by staff, warm blankets, etc.   -no new symptoms -also continues decadron after swelling of left eye and weakness of left leg both of which are improved -wishes to continue therapy at this time  2. Hypertension associated with type 2 diabetes mellitus ([place]) -continue low dose amlodipine for bp - metFORMIN (GLUCOPHAGE) 500 MG tablet; Take 1 tablet (500 mg total) by mouth daily with breakfast.  Dispense: 90 tablet; Refill: 3  3. Diabetes mellitus type 2 in nonobese (HCC) -CBGs had been quite high around his birthday when he ate too much cake, but now back down after cake gone and other snacks removed from his reach (he was sneaking into them) -cont metformin and januvia Lab Results  Component Value Date   HGBA1C 6.7 (A) 05/01/2021    4. Palliative care by specialist -reviewed goals--currently to continue tchemotherapy--has repeat CT on 2/13 with contrast and then oncology f/u -need to confirm if HCPOA/LW document ever got notarized with cancer center (doubt b/c not in vynca) -code status is full code  Follow up Palliative Care Visit: Palliative care will continue to follow for complex medical decision making, advance care planning, and clarification of goals. Return 4-6 weeks or prn.  This visit was coded based on medical decision making (MDM).  PPS: 60%  HOSPICE ELIGIBILITY/DIAGNOSIS: Yes with metastatic lung cancer to brain, but pt continuing chemo  Chief Complaint: Follow-up palliative visit  HISTORY OF PRESENT ILLNESS:  Benjamin Willis. is a 76 y.o. year old male  with lung cancer metastatic to brain, DMII with neuropathy, htn, left hemiparesis, glaucoma,  gerd, hyperlipidemia.  Reviewed meds pt had and updated med list today.  CBGs were in  200s for a while b/c he was eating birthday cake.    History obtained from review of EMR, discussion with primary team, and interview with family, facility staff/caregiver and/or Benjamin Willis.   I reviewed available labs, medications, imaging, studies and related documents from the EMR.  Records reviewed and summarized above.   ROS  General: NAD EYES: denies vision changes, actually wearing his glasses which I'd never seen ENMT: denies dysphagia Cardiovascular: denies chest pain, denies DOE Pulmonary: denies cough, denies increased SOB Abdomen: endorses good appetite, denies constipation, endorses continence of bowel GU: denies dysuria, endorses continence of urine MSK:  denies increased weakness,  no falls reported Skin: denies rashes or wounds Neurological: denies pain, denies insomnia Psych: Endorses positive mood Heme/lymph/immuno: denies bruises, abnormal bleeding  Physical Exam: Current and past weights: 162.6 up from 156.9lb Constitutional: NAD General:  thin but well nourished appearing EYES: anicteric sclera, lids intact, no discharge , glasses ENMT: intact hearing, oral mucous membranes moist, dentition intact CV: S1S2, RRR, nonpitting LE edema at sock line Pulmonary: LCTA, no increased work of breathing, no cough, room air Abdomen: intake 100%, normo-active BS + 4 quadrants, soft and non tender, no ascites GU: deferred MSK: no sarcopenia, moves all extremities, ambulatory with cane  Skin: warm and dry, no rashes or wounds on visible skin Neuro:  no generalized weakness,  has cognitive impairment and some disinhibition noted Psych: non-anxious affect, A and O x 3 Hem/lymph/immuno: no widespread bruising  CURRENT PROBLEM LIST:  Patient Active Problem List   Diagnosis Date Noted   Leg weakness 04/01/2021   Port-A-Cath in place 12/14/2020   Brain metastases [facility])    Palliative care by specialist    Metastatic lung cancer (metastasis from lung to other site) ([place])  10/16/2020   Diabetes mellitus type 2 in nonobese ([place]) 10/16/2020   Glaucoma 10/16/2020   Lung cancer metastatic to brain ([place]) 10/16/2020   Atherosclerosis of aorta ([place]) 05/31/2020   Primary cancer of right upper lobe of lung ([place]) 02/07/2019   Lung nodule 02/02/2019   Hypertension associated with type 2 diabetes mellitus ([place]) 05/24/2018   Dyslipidemia 05/24/2018   Dyslipidemia associated with type 2 diabetes mellitus ([place]) 06/09/2017   PAST MEDICAL HISTORY:  Active Ambulatory Problems    Diagnosis Date Noted   Dyslipidemia associated with type 2 diabetes mellitus ([place]) 06/09/2017   Hypertension associated with type 2 diabetes mellitus ([place]) 05/24/2018   Dyslipidemia 05/24/2018   Lung nodule 02/02/2019   Primary cancer of right upper lobe of lung ([place]) 02/07/2019   Atherosclerosis of aorta (HCC) 05/31/2020   Metastatic lung cancer (metastasis from lung to other site) ([place]) 10/16/2020   Diabetes mellitus type 2 in nonobese ([place]) 10/16/2020   Glaucoma 10/16/2020   Lung cancer metastatic to brain ([place]) 10/16/2020   Brain metastases ([place])    Palliative care by specialist    Port-A-Cath in place 12/14/2020   Leg weakness 04/01/2021   Resolved Ambulatory Problems    Diagnosis Date Noted   Acute conjunctivitis of both eyes  01/01/2017   Elevated blood pressure reading in office without diagnosis of hypertension 01/01/2017   Goals of care, counseling/discussion 01/01/2017   SIRS (systemic inflammatory response syndrome) ([place]) 01/07/2021   E. coli urinary tract infection 01/07/2021   Past Medical History:  Diagnosis Date   Allergy    Diabetes mellitus without complication ([place])    History of radiation therapy    History of radiation therapy 10/30/2020   Hyperlipidemia    Hypertension    Lung cancer ([place])    SOCIAL HX:  Social History   Tobacco Use   Smoking status: Former    Packs/day: 1.00    Years: 48.00    Pack years: 48.00    Types: Cigarettes    Quit date:  02/20/2015    Years since quitting: 6.2   Smokeless tobacco: Former    Quit date: 10/2013  Substance Use Topics   Alcohol use: No     ALLERGIES: No Known Allergies   PERTINENT MEDICATIONS:  Outpatient Encounter Medications as of 06/06/2021  Medication Sig   albuterol (VENTOLIN HFA) 108 (90 Base) MCG/ACT inhaler Inhale 2 puffs into the lungs every 6 (six) hours as needed for wheezing or shortness of breath.   allopurinol (ZYLOPRIM) 100 MG tablet Take 100 mg by mouth daily. (Patient not taking: Reported on 05/01/2021)   amLODipine (NORVASC) 10 MG tablet Take 1 tablet by mouth once daily   amLODipine-benazepril (LOTREL) 10-20 MG capsule Take 1 capsule by mouth daily. (Patient not taking: Reported on 05/01/2021)   ammonium lactate (AMLACTIN) 12 % lotion Apply 1 application topically as needed for dry skin.   blood glucose meter kit and supplies KIT Dispense based on patient and insurance preference. Use up to four times daily as directed.   Blood Pressure Monitoring (BLOOD PRESSURE MONITOR/ARM) DEVI Check BP once a day. DX I 10   cetirizine (ZYRTEC) 10 MG tablet Take 1 tablet (10 mg total) by mouth daily. (Patient not taking: Reported on 05/01/2021)   dexamethasone (DECADRON) 2 MG tablet Take 1 tablet (2 mg total) by mouth daily.   erythromycin ophthalmic ointment Place 1 application into the left eye at bedtime.   folic acid (FOLVITE) 1 MG tablet Take 1 tablet (1 mg total) by mouth daily.   furosemide (LASIX) 20 MG tablet Take 20 mg by mouth daily. (Patient not taking: Reported on 05/01/2021)   gabapentin (NEURONTIN) 300 MG capsule Take 1 capsule (300 mg total) by mouth at bedtime.   glipiZIDE (GLUCOTROL) 5 MG tablet TAKE 1 TABLET BY MOUTH TWICE DAILY WITH A MEAL   latanoprost (XALATAN) 0.005 % ophthalmic solution Place 1 drop into both eyes at bedtime.   levETIRAcetam (KEPPRA) 1000 MG tablet Take 1 tablet by mouth twice daily   lidocaine-prilocaine (EMLA) cream Apply 1 application topically  as needed. (Patient taking differently: Apply 1 application topically as needed (for port before tx).)   lisinopril (ZESTRIL) 40 MG tablet Take 1 tablet (40 mg total) by mouth daily. (Patient not taking: Reported on 05/01/2021)   metFORMIN (GLUCOPHAGE) 500 MG tablet TAKE 1 TABLET BY MOUTH TWICE DAILY WITH MEALS (Patient taking differently: Take 500 mg by mouth daily with breakfast.)   Multiple Vitamins-Minerals (MULTIVITAMIN MEN) TABS Take 1 tablet by mouth daily.   ondansetron (ZOFRAN) 8 MG tablet Take 1 tablet (8 mg total) by mouth every 8 (eight) hours as needed for nausea or vomiting.   pantoprazole (PROTONIX) 40 MG tablet Take 1 tablet (40 mg total) by mouth daily.   potassium  chloride SA (KLOR-CON) 20 MEQ tablet Take 20 mEq by mouth daily.   prochlorperazine (COMPAZINE) 10 MG tablet Take 1 tablet (10 mg total) by mouth every 6 (six) hours as needed for nausea or vomiting.   rosuvastatin (CRESTOR) 20 MG tablet Take 1 tablet (20 mg total) by mouth daily.   sitaGLIPtin (JANUVIA) 100 MG tablet Take 1 tablet (100 mg total) by mouth daily.   No facility-administered encounter medications on file as of 06/06/2021.   Thank you for the opportunity to participate in the care of Benjamin Willis.  The palliative care team will continue to follow. Please call our office at [phone] if we can be of additional assistance.   Hollace Kinnier, DO  COVID-19 PATIENT SCREENING TOOL Asked and negative response unless otherwise noted:  Have you had symptoms of covid, tested positive or been in contact with someone with symptoms/positive test in the past 5-10 days? no

## 2021-06-07 ENCOUNTER — Inpatient Hospital Stay (HOSPITAL_BASED_OUTPATIENT_CLINIC_OR_DEPARTMENT_OTHER): Payer: Medicare HMO | Admitting: Hematology and Oncology

## 2021-06-07 ENCOUNTER — Inpatient Hospital Stay: Payer: Medicare HMO

## 2021-06-07 ENCOUNTER — Inpatient Hospital Stay: Payer: Medicare HMO | Attending: Hematology and Oncology

## 2021-06-07 VITALS — BP 131/75 | HR 74 | Temp 98.4°F | Resp 18 | Ht 66.0 in | Wt 162.6 lb

## 2021-06-07 DIAGNOSIS — D6481 Anemia due to antineoplastic chemotherapy: Secondary | ICD-10-CM | POA: Insufficient documentation

## 2021-06-07 DIAGNOSIS — D701 Agranulocytosis secondary to cancer chemotherapy: Secondary | ICD-10-CM | POA: Insufficient documentation

## 2021-06-07 DIAGNOSIS — C3490 Malignant neoplasm of unspecified part of unspecified bronchus or lung: Secondary | ICD-10-CM

## 2021-06-07 DIAGNOSIS — C7931 Secondary malignant neoplasm of brain: Secondary | ICD-10-CM | POA: Diagnosis not present

## 2021-06-07 DIAGNOSIS — Z5111 Encounter for antineoplastic chemotherapy: Secondary | ICD-10-CM | POA: Diagnosis not present

## 2021-06-07 DIAGNOSIS — Z95828 Presence of other vascular implants and grafts: Secondary | ICD-10-CM

## 2021-06-07 DIAGNOSIS — Z79899 Other long term (current) drug therapy: Secondary | ICD-10-CM | POA: Insufficient documentation

## 2021-06-07 DIAGNOSIS — Z5112 Encounter for antineoplastic immunotherapy: Secondary | ICD-10-CM | POA: Diagnosis not present

## 2021-06-07 DIAGNOSIS — Z7952 Long term (current) use of systemic steroids: Secondary | ICD-10-CM | POA: Insufficient documentation

## 2021-06-07 DIAGNOSIS — T451X5A Adverse effect of antineoplastic and immunosuppressive drugs, initial encounter: Secondary | ICD-10-CM | POA: Diagnosis not present

## 2021-06-07 DIAGNOSIS — E032 Hypothyroidism due to medicaments and other exogenous substances: Secondary | ICD-10-CM

## 2021-06-07 DIAGNOSIS — R531 Weakness: Secondary | ICD-10-CM | POA: Insufficient documentation

## 2021-06-07 DIAGNOSIS — C3411 Malignant neoplasm of upper lobe, right bronchus or lung: Secondary | ICD-10-CM | POA: Insufficient documentation

## 2021-06-07 LAB — CBC WITH DIFFERENTIAL (CANCER CENTER ONLY)
Abs Immature Granulocytes: 0.03 10*3/uL (ref 0.00–0.07)
Basophils Absolute: 0 10*3/uL (ref 0.0–0.1)
Basophils Relative: 0 %
Eosinophils Absolute: 0 10*3/uL (ref 0.0–0.5)
Eosinophils Relative: 0 %
HCT: 34 % — ABNORMAL LOW (ref 39.0–52.0)
Hemoglobin: 11.5 g/dL — ABNORMAL LOW (ref 13.0–17.0)
Immature Granulocytes: 1 %
Lymphocytes Relative: 15 %
Lymphs Abs: 1 10*3/uL (ref 0.7–4.0)
MCH: 32.8 pg (ref 26.0–34.0)
MCHC: 33.8 g/dL (ref 30.0–36.0)
MCV: 96.9 fL (ref 80.0–100.0)
Monocytes Absolute: 0.7 10*3/uL (ref 0.1–1.0)
Monocytes Relative: 10 %
Neutro Abs: 4.9 10*3/uL (ref 1.7–7.7)
Neutrophils Relative %: 74 %
Platelet Count: 228 10*3/uL (ref 150–400)
RBC: 3.51 MIL/uL — ABNORMAL LOW (ref 4.22–5.81)
RDW: 12.6 % (ref 11.5–15.5)
WBC Count: 6.7 10*3/uL (ref 4.0–10.5)
nRBC: 0 % (ref 0.0–0.2)

## 2021-06-07 LAB — CMP (CANCER CENTER ONLY)
ALT: 67 U/L — ABNORMAL HIGH (ref 0–44)
AST: 24 U/L (ref 15–41)
Albumin: 4.2 g/dL (ref 3.5–5.0)
Alkaline Phosphatase: 65 U/L (ref 38–126)
Anion gap: 6 (ref 5–15)
BUN: 12 mg/dL (ref 8–23)
CO2: 29 mmol/L (ref 22–32)
Calcium: 9.1 mg/dL (ref 8.9–10.3)
Chloride: 103 mmol/L (ref 98–111)
Creatinine: 0.86 mg/dL (ref 0.61–1.24)
GFR, Estimated: >60 mL/min
Glucose, Bld: 311 mg/dL — ABNORMAL HIGH (ref 70–99)
Potassium: 3.8 mmol/L (ref 3.5–5.1)
Sodium: 138 mmol/L (ref 135–145)
Total Bilirubin: 0.4 mg/dL (ref 0.3–1.2)
Total Protein: 6.7 g/dL (ref 6.5–8.1)

## 2021-06-07 LAB — TSH: TSH: 1.542 u[IU]/mL (ref 0.320–4.118)

## 2021-06-07 MED ORDER — SODIUM CHLORIDE 0.9% FLUSH
10.0000 mL | INTRAVENOUS | Status: DC | PRN
  Administered 2021-06-07: 10 mL

## 2021-06-07 MED ORDER — SODIUM CHLORIDE 0.9 % IV SOLN
500.0000 mg/m2 | Freq: Once | INTRAVENOUS | Status: AC
  Administered 2021-06-07: 900 mg via INTRAVENOUS
  Filled 2021-06-07: qty 20

## 2021-06-07 MED ORDER — HEPARIN SOD (PORK) LOCK FLUSH 100 UNIT/ML IV SOLN
500.0000 [IU] | Freq: Once | INTRAVENOUS | Status: AC | PRN
  Administered 2021-06-07: 500 [IU]

## 2021-06-07 MED ORDER — CYANOCOBALAMIN 1000 MCG/ML IJ SOLN: 1000.0000 ug | Freq: Once | INTRAMUSCULAR | Status: DC

## 2021-06-07 MED ORDER — PROCHLORPERAZINE MALEATE 10 MG PO TABS
10.0000 mg | ORAL_TABLET | Freq: Once | ORAL | Status: AC
  Administered 2021-06-07: 10 mg via ORAL
  Filled 2021-06-07: qty 1

## 2021-06-07 MED ORDER — SODIUM CHLORIDE 0.9 % IV SOLN: Freq: Once | INTRAVENOUS | Status: AC

## 2021-06-07 MED ORDER — SODIUM CHLORIDE 0.9 % IV SOLN
200.0000 mg | Freq: Once | INTRAVENOUS | Status: AC
  Administered 2021-06-07: 200 mg via INTRAVENOUS
  Filled 2021-06-07: qty 8

## 2021-06-07 MED ORDER — SODIUM CHLORIDE 0.9% FLUSH
10.0000 mL | Freq: Once | INTRAVENOUS | Status: AC
  Administered 2021-06-07: 10 mL

## 2021-06-07 NOTE — Progress Notes (Signed)
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  This was followed by an MRI of the brain with and without contrast which showed 4 brain lesions consistent with metastases, extensive vasogenic edema associated with the posterior right frontal lesion without midline shift. 6/15-6/28/2022: 10 fractions of WBRT with Rad/Onc 11/23/2020: Guardant 360 returns with no targetable mutation.  12/14/2020: Cycle 1 Day 1 of Carbo/Pem/Pem 01/04/2021: Cycle 2 Day 1 of Carbo/Pem/Pem 01/25/2021: Cycle 3 Day 1 of Carbo/Pem/Pem 02/15/2021: Cycle 4 Day 1 of Carbo/Pem/Pem 03/08/2021: Cycle 5 Day 1 of maintenance Pem/Pem 03/29/2021: Cycle 6 Day 1 of maintenance Pem/Pem 04/19/2021: Cycle 7 Day 1 of maintenance Pem/Pem 05/16/2021: Cycle 8 Day 1 of maintenance Pem/Pem 06/27/2021: Cycle 9 Day 1 of maintenance Pem/Pem  Interval History:  Benjamin Willis. 76 y.o. 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 The patient's last visit was on 05/16/2021.  In the interim since the last visit he has completed Cycle 8 of maintenance chemotherapy. He presents today for Cycle 9 Day 1.   On exam today Mrs. Hallisey is unaccompanied.  He reports he has occasional issues with cramps in his left leg and shin but uses pillows to  help prop it up and that helps with the discomfort.  He notes that he is not having any clear side effects as result of his chemotherapy treatment.  His appetite is good and his weight has been steady.  He notes that he does occasionally have a loose bowel movement but no frank diarrhea.  Overall he is tolerating treatment has not noticed any side effects as result of his medications.  He has no questions concerns or complaints today.  He currently denies any issues with fevers, chills, sweats, nausea, ming or diarrhea.  A full 10 point ROS is listed below.  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 Gery Pray   History of radiation therapy 10/30/2020   whole brain 10/17/2020-10/30/2020 Dr Gery Pray   Hyperlipidemia    Hypertension    Lung cancer ([place])    Stage IA2 (cT1b, N0) non-small cell lung cancer (adenocarcinoma)    SURGICAL HISTORY: Past Surgical History:  Procedure Laterality Date   ELECTROMAGNETIC NAVIGATION BROCHOSCOPY Right 02/02/2019   Procedure: VIDEO BRONCHOSCOPY WITH NAVIGATION AND FIDUCIAL PLACEMENT;  Surgeon: Garner Nash, DO;  Location: [facility];  Service: Cardiopulmonary;  Laterality: Right;   FUDUCIAL PLACEMENT Right 02/02/2019   Procedure: Placement Of Fuducial Right Upper Lobe;  Surgeon: Garner Nash, DO;  Location: [facility];  Service: Cardiopulmonary;  Laterality: Right;   IR IMAGING GUIDED PORT INSERTION  12/13/2020   none     VIDEO BRONCHOSCOPY WITH RADIAL ENDOBRONCHIAL ULTRASOUND  Right 02/02/2019   Procedure: Video Bronchoscopy With Radial Endobronchial Ultrasound;  Surgeon: Garner Nash, DO;  Location: [facility];  Service: Cardiopulmonary;  Laterality: Right;    SOCIAL HISTORY: Social History   Socioeconomic History   Marital status: Single     Spouse name: Not on file   Number of children: 0   Years of education: Not on file   Highest education level: 9th grade  Occupational History   Not on file  Tobacco Use   Smoking status: Former    Packs/day: 1.00    Years: 48.00    Pack years: 48.00    Types: Cigarettes    Quit date: 02/20/2015    Years since quitting: 6.2   Smokeless tobacco: Former    Quit date: 10/2013  Vaping Use   Vaping Use: Never used  Substance and Sexual Activity   Alcohol use: No   Drug use: No   Sexual activity: Yes    Birth control/protection: Condom  Other Topics Concern   Not on file  Social History Narrative   Not on file   Social Determinants of Health   Financial Resource Strain: Not on file  Food Insecurity: Not on file  Transportation Needs: Not on file  Physical Activity: Not on file  Stress: Not on file  Social Connections: Not on file  Intimate Partner Violence: Not on file    FAMILY HISTORY: Family History  Problem Relation Age of Onset   Lung cancer Neg Hx     ALLERGIES:  has No Known Allergies.  MEDICATIONS:  Current Outpatient Medications  Medication Sig Dispense Refill   albuterol (VENTOLIN HFA) 108 (90 Base) MCG/ACT inhaler Inhale 2 puffs into the lungs every 6 (six) hours as needed for wheezing or shortness of breath. 18 g 6   amLODipine (NORVASC) 5 MG tablet Take 5 mg by mouth daily.     ammonium lactate (AMLACTIN) 12 % lotion Apply 1 application topically as needed for dry skin. 400 g 0   atorvastatin (LIPITOR) 20 MG tablet Take 20 mg by mouth daily.     blood glucose meter kit and supplies KIT Dispense based on patient and insurance preference. Use up to four times daily as directed. 1 each 0   Blood Pressure Monitoring (BLOOD PRESSURE MONITOR/ARM) DEVI Check BP once a day. DX I 10 1 each 0   dexamethasone (DECADRON) 2 MG tablet Take 1 tablet (2 mg total) by mouth daily. 60 tablet 1   erythromycin ophthalmic ointment Place 1 application into the left eye at  bedtime. 3.5 g 0   folic acid (FOLVITE) 1 MG tablet Take 1 tablet (1 mg total) by mouth daily. 90 tablet 1   gabapentin (NEURONTIN) 300 MG capsule Take 1 capsule (300 mg total) by mouth at bedtime. 90 capsule 1   glipiZIDE (GLUCOTROL) 5 MG tablet TAKE 1 TABLET BY MOUTH TWICE DAILY WITH A MEAL 180 tablet 0   latanoprost (XALATAN) 0.005 % ophthalmic solution Place 1 drop into both eyes at bedtime. 2.5 mL 3   levETIRAcetam (KEPPRA) 1000 MG tablet Take 1 tablet by mouth twice daily 180 tablet 1   lidocaine-prilocaine (EMLA) cream Apply 1 application topically as needed. (Patient taking differently: Apply 1 application topically as needed (for port before tx).) 30 g 0   metFORMIN (GLUCOPHAGE) 500 MG tablet Take 1 tablet (500 mg total) by mouth daily with breakfast. 90 tablet 3   Multiple Vitamins-Minerals (MULTIVITAMIN MEN) TABS Take 1 tablet by mouth daily.  ondansetron (ZOFRAN) 8 MG tablet Take 1 tablet (8 mg total) by mouth every 8 (eight) hours as needed for nausea or vomiting. 30 tablet 0   pantoprazole (PROTONIX) 40 MG tablet Take 1 tablet (40 mg total) by mouth daily. 90 tablet 1   potassium chloride SA (KLOR-CON) 20 MEQ tablet Take 20 mEq by mouth daily.     prochlorperazine (COMPAZINE) 10 MG tablet Take 1 tablet (10 mg total) by mouth every 6 (six) hours as needed for nausea or vomiting. 30 tablet 0   rosuvastatin (CRESTOR) 20 MG tablet Take 1 tablet (20 mg total) by mouth daily. 90 tablet 1   sitaGLIPtin (JANUVIA) 100 MG tablet Take 1 tablet (100 mg total) by mouth daily. 90 tablet 3   No current facility-administered medications for this visit.   Facility-Administered Medications Ordered in Other Visits  Medication Dose Route Frequency Provider Last Rate Last Admin   heparin lock flush 100 unit/mL  500 Units Intracatheter Once PRN Orson Slick, MD       pembrolizumab [facility]) 200 mg in sodium chloride 0.9 % 50 mL chemo infusion  200 mg Intravenous Once Ledell Peoples IV, MD 116  mL/hr at 06/07/21 1337 200 mg at 06/07/21 1337   PEMEtrexed (ALIMTA) 900 mg in sodium chloride 0.9 % 100 mL chemo infusion  500 mg/m2 (Treatment Plan Recorded) Intravenous Once Orson Slick, MD       sodium chloride flush (NS) 0.9 % injection 10 mL  10 mL Intracatheter PRN Orson Slick, MD        REVIEW OF SYSTEMS:   Constitutional: ( - ) fevers, ( - )  chills , ( - ) night sweats Eyes: ( - ) blurriness of vision, ( - ) double vision, ( - ) watery eyes Ears, nose, mouth, throat, and face: ( - ) mucositis, ( - ) sore throat Respiratory: ( - ) cough, ( - ) dyspnea, ( - ) wheezes Cardiovascular: ( - ) palpitation, ( - ) chest discomfort, ( - ) lower extremity swelling Gastrointestinal:  ( - ) nausea, ( - ) heartburn, ( - ) change in bowel habits Skin: ( - ) abnormal skin rashes Lymphatics: ( - ) new lymphadenopathy, ( - ) easy bruising Neurological: ( - ) numbness, ( - ) tingling, ( - ) new weaknesses Behavioral/Psych: ( - ) mood change, ( - ) new changes  All other systems were reviewed with the patient and are negative.  PHYSICAL EXAMINATION:  Vitals:   06/07/21 1139  BP: 131/75  Pulse: 74  Resp: 18  Temp: 98.4 F (36.9 C)  SpO2: 100%     Filed Weights   06/07/21 1139  Weight: 162 lb 9.6 oz (73.8 kg)     GENERAL: Well-appearing elderly African-American male, alert, no distress and comfortable SKIN:  Skin color, texture, turgor are normal, no rashes or significant lesions EYES: conjunctiva are pink and non-injected, sclera clear LUNGS: clear to auscultation and percussion with normal breathing effort HEART: regular rate & rhythm and no murmurs and no lower extremity edema PSYCH: alert & oriented x 3, fluent speech NEURO: No focal deficits. No facial droop.   LABORATORY DATA:  I have reviewed the data as listed CBC Latest Ref Rng & Units 06/07/2021 05/16/2021 04/19/2021  WBC 4.0 - 10.5 K/uL 6.7 6.3 6.6  Hemoglobin 13.0 - 17.0 g/dL 11.5(L) 11.5(L) 10.8(L)   Hematocrit 39.0 - 52.0 % 34.0(L) 34.7(L) 32.8(L)  Platelets 150 - 400 K/uL  228 240 277    CMP Latest Ref Rng & Units 06/07/2021 05/16/2021 04/19/2021  Glucose 70 - 99 mg/dL 311(H) 170(H) 148(H)  BUN 8 - 23 mg/dL $Remove'12 18 13  'KdfSZHs$ Creatinine 0.61 - 1.24 mg/dL 0.86 0.78 0.80  Sodium 135 - 145 mmol/L 138 142 142  Potassium 3.5 - 5.1 mmol/L 3.8 3.9 3.8  Chloride 98 - 111 mmol/L 103 106 107  CO2 22 - 32 mmol/L $RemoveB'29 30 26  'whYFZxPH$ Calcium 8.9 - 10.3 mg/dL 9.1 9.3 8.7(L)  Total Protein 6.5 - 8.1 g/dL 6.7 6.8 6.7  Total Bilirubin 0.3 - 1.2 mg/dL 0.4 0.3 0.3  Alkaline Phos 38 - 126 U/L 65 68 67  AST 15 - 41 U/L 24 20 13(L)  ALT 0 - 44 U/L 67(H) 33 15    RADIOGRAPHIC STUDIES:  MR BRAIN W WO CONTRAST  Result Date: 05/14/2021 CLINICAL DATA:  Brain metastases (HCC) C79.31 (ICD-10-CM). Brain/CNS neoplasm, assess treatment response. EXAM: MRI HEAD WITHOUT AND WITH CONTRAST TECHNIQUE: Multiplanar, multiecho pulse sequences of the brain and surrounding structures were obtained without and with intravenous contrast. CONTRAST:  58mL GADAVIST GADOBUTROL 1 MMOL/ML IV SOLN COMPARISON:  MRI of the brain February 02, 2021. FINDINGS: Brain: Again seen are 4 enhancing lesions consistent with metastases, as described below. Enlarged: Right posterior frontal lobe, 17 mm (15 mm on prior), series 16, image 111. Right parietal lobe in the precuneus, 14 mm (10 mm on prior), series 16, image 86. Stable or smaller: Left occipital lobe, 11 mm (11 mm on prior), series 16, image 62. Medial right cerebellar hemisphere, 10 mm (12 mm on prior) series 16, image 32. No new enhancing lesion identified. There is increase in the vasogenic edema surrounding the right frontal, right parietal and left occipital lesions. Scattered and confluent T2 hyperintensity within the white matter of the cerebral hemispheres head significantly progressed from prior MRI and is likely treatment related. Vascular: Normal flow voids. Skull and upper cervical spine: Normal  marrow signal. Sinuses/Orbits: Mild mucosal thickening of the ethmoid cells and left maxillary sinus. The orbits are maintained. Other: Bilateral mastoid effusion. IMPRESSION: 1. Mixed treatment response with increase in size of 2 lesions, 1 stable lesion and 1 lesion smaller when compared to prior MRI. 2. Progression of T2 hyperintensity within the supratentorial white matter, likely treatment related. Electronically Signed   By: Pedro Earls M.D.   On: 05/14/2021 09:10    ASSESSMENT & PLAN Benjamin Willis. 76 y.o. 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  After review the labs, reviewed records, discussion with the patient the findings are most consistent with metastatic adenocarcinoma with spread to the brain.  Unfortunately we do not have new pathology as the lesions in his brain were not resectable and he underwent radiation therapy instead.  We can presume based off his prior pathology that this represents the original adenocarcinoma.  As such I do believe it is reasonable to proceed with carboplatin, pembrolizumab, pemetrexed therapy in order to help treat any possible micrometastatic disease.  The patient also underwent whole brain radiation therapy instead of SRS.  Therefore I do believe systemic therapy will be reasonable.  This plan of care was discussed with the resident lung expert Dr. Julien Nordmann. Additionally our [place] 360 testing was negative for a targetable mutation.   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 Lost to follow up --presents with brain metastasis only. No evidence of disease in the C/A/P noted on CT scan from 03/05/2021.  --Patient completed whole brain radiation, but did not wish to undergo SRS.  Radiation oncology noted that this may be an  option in the future if he were to have recurrence in the brain --Guardant 360 on 11/01/2020 showed no targetable mutations.  Plan: --Labs today were reviewed. No intervention required and adequate to proceed with treatment.  --plan to proceed with maintenance Pem/Pem chemotherapy, Cycle 9 Day 1 today. --Plan for CT in 3 months (Feb 2023) --continue to follow with neuro-oncology for brain metastasis --RTC in 3 weeks for Cycle 10 of chemotherapy with maintenance pembrolizumab/pemetrexed.   #Left leg weakness --Improved with corticosteroid therapy, continue on dexamethasone 2 mg daily  --MRI ordered by neurology.  --Continue to monitor.  #Chemotherapy Induced Anemia-stable #Chemotherapy Induced Leukopenia-improved  --today WBC 6.7 and Hgb 11.5. ANC 4.9. --continue to monitor   #Supportive Care -- chemotherapy education complete  -- port placed --daily PO 1mg  folic acid with pemetrexed. -- zofran 8mg  q8H PRN and compazine 10mg  PO q6H for nausea -- EMLA cream for port -- no pain medication required at this time.   No orders of the defined types were placed in this encounter.  All questions were answered. The patient knows to call the clinic with any problems, questions or concerns.  I have spent a total of 30 minutes minutes of face-to-face and non-face-to-face time, preparing to see the patient, performing a medically appropriate examination, counseling and educating the patient, ordering medications, referring and communicating with other health care professionals, documenting clinical information in the electronic health record, and care coordination.   Ledell Peoples, MD Department of Hematology/Oncology [place] [facility] Phone: [phone] Pager: [phone] Email: [email or web address]  06/07/2021 1:55 PM

## 2021-06-07 NOTE — Patient Instructions (Signed)
[facility] MEDICAL ONCOLOGY  Discharge Instructions: Thank you for choosing [facility] to provide your oncology and hematology care.   If you have a lab appointment with the Cancer Center, please go directly to the Cancer Center and check in at the registration area.   Wear comfortable clothing and clothing appropriate for easy access to any Portacath or PICC line.   We strive to give you quality time with your provider. You may need to reschedule your appointment if you arrive late (15 or more minutes).  Arriving late affects you and other patients whose appointments are after yours.  Also, if you miss three or more appointments without notifying the office, you may be dismissed from the clinic at the provider's discretion.      For prescription refill requests, have your pharmacy contact our office and allow 72 hours for refills to be completed.    Today you received the following chemotherapy and/or immunotherapy agents Pembrolizumab (Keytruda) and Pemetrexed (Alimta)      To help prevent nausea and vomiting after your treatment, we encourage you to take your nausea medication as directed.  BELOW ARE SYMPTOMS THAT SHOULD BE REPORTED IMMEDIATELY: *FEVER GREATER THAN 100.4 F (38 C) OR HIGHER *CHILLS OR SWEATING *NAUSEA AND VOMITING THAT IS NOT CONTROLLED WITH YOUR NAUSEA MEDICATION *UNUSUAL SHORTNESS OF BREATH *UNUSUAL BRUISING OR BLEEDING *URINARY PROBLEMS (pain or burning when urinating, or frequent urination) *BOWEL PROBLEMS (unusual diarrhea, constipation, pain near the anus) TENDERNESS IN MOUTH AND THROAT WITH OR WITHOUT PRESENCE OF ULCERS (sore throat, sores in mouth, or a toothache) UNUSUAL RASH, SWELLING OR PAIN  UNUSUAL VAGINAL DISCHARGE OR ITCHING   Items with * indicate a potential emergency and should be followed up as soon as possible or go to the Emergency Department if any problems should occur.  Please show the CHEMOTHERAPY ALERT CARD or  IMMUNOTHERAPY ALERT CARD at check-in to the Emergency Department and triage nurse.  Should you have questions after your visit or need to cancel or reschedule your appointment, please contact [facility]  Dept: [phone]  and follow the prompts.  Office hours are 8:00 a.m. to 4:30 p.m. Monday - Friday. Please note that voicemails left after 4:00 p.m. may not be returned until the following business day.  We are closed weekends and major holidays. You have access to a nurse at all times for urgent questions. Please call the main number to the clinic Dept: [phone] and follow the prompts.   For any non-urgent questions, you may also contact your provider using MyChart. We now offer e-Visits for anyone 18 and older to request care online for non-urgent symptoms. For details visit [email or web address].   Also download the MyChart app! Go to the app store, search "MyChart", open the app, select [facility], and log in with your MyChart username and password.  Due to Covid, a mask is required upon entering the hospital/clinic. If you do not have a mask, one will be given to you upon arrival. For doctor visits, patients may have 1 support person aged 18 or older with them. For treatment visits, patients cannot have anyone with them due to current Covid guidelines and our immunocompromised population.   

## 2021-06-11 ENCOUNTER — Other Ambulatory Visit: Payer: Self-pay | Admitting: Emergency Medicine

## 2021-06-11 DIAGNOSIS — E1159 Type 2 diabetes mellitus with other circulatory complications: Secondary | ICD-10-CM

## 2021-06-13 ENCOUNTER — Other Ambulatory Visit: Payer: Self-pay | Admitting: Radiation Therapy

## 2021-06-17 ENCOUNTER — Ambulatory Visit (HOSPITAL_COMMUNITY)
Admission: RE | Admit: 2021-06-17 | Discharge: 2021-06-17 | Disposition: A | Discharge status: Home / Self Care | Payer: Medicare HMO | Source: Ambulatory Visit | Attending: Hematology and Oncology | Admitting: Hematology and Oncology

## 2021-06-17 ENCOUNTER — Other Ambulatory Visit: Payer: Self-pay

## 2021-06-17 DIAGNOSIS — C3490 Malignant neoplasm of unspecified part of unspecified bronchus or lung: Secondary | ICD-10-CM | POA: Insufficient documentation

## 2021-06-17 DIAGNOSIS — N3289 Other specified disorders of bladder: Secondary | ICD-10-CM | POA: Diagnosis not present

## 2021-06-17 DIAGNOSIS — I319 Disease of pericardium, unspecified: Secondary | ICD-10-CM | POA: Diagnosis not present

## 2021-06-17 IMAGING — CT CT CHEST-ABD-PELV W/ CM
2 of 5 series · 13 of 36 positions shown, 15 images · IV contrast (agent unspecified)
Comparison: Multiple priors including most recent CT [DATE]

CLINICAL DATA: History of metastatic non-small cell lung cancer,
previous radiation therapy ongoing chemotherapy.

EXAM:
CT CHEST, ABDOMEN, AND PELVIS WITH CONTRAST
TECHNIQUE: Multidetector CT imaging of the chest, abdomen and pelvis was
performed following the standard protocol during bolus
administration of intravenous contrast.

[Series 2: cap with · axial · 0.72mm/px · z∈[-438,+96]mm · 10 of 131 slices shown, 12 images]
[im 12/131  mediastinal]
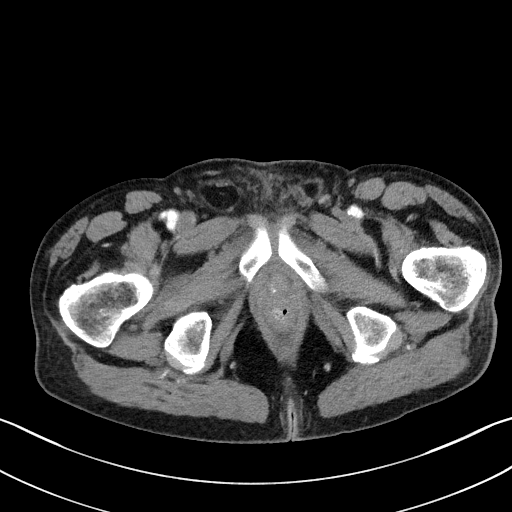
[im 12/131  bone]
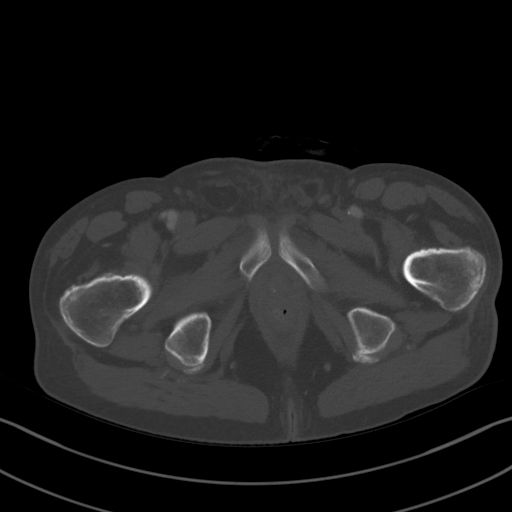
[im 24/131  mediastinal]
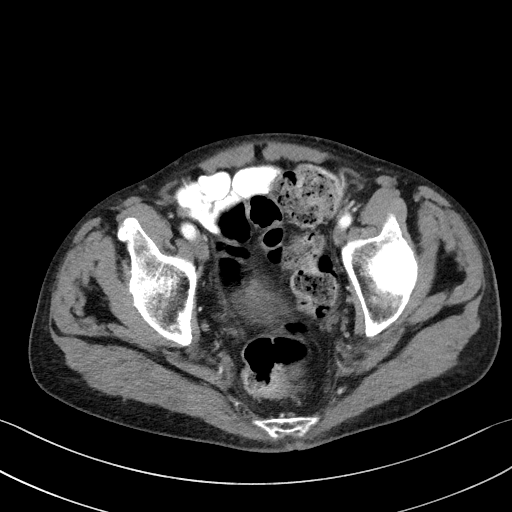
[im 36/131  mediastinal]
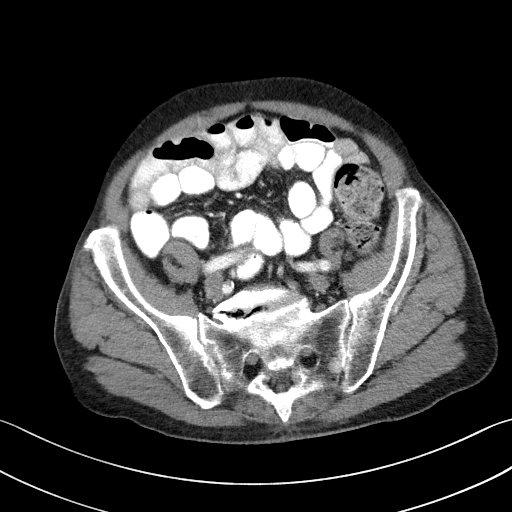
[im 48/131  mediastinal]
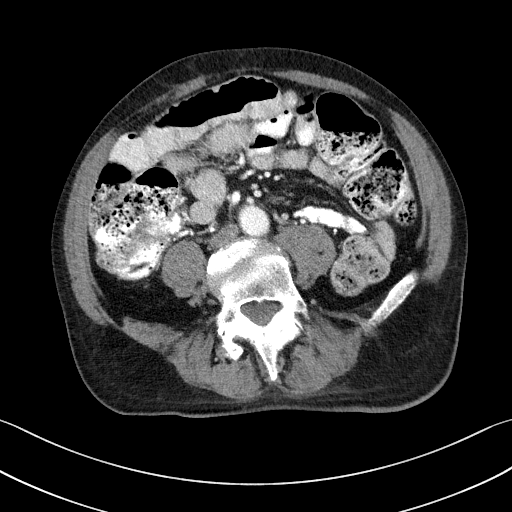
[im 60/131  mediastinal]
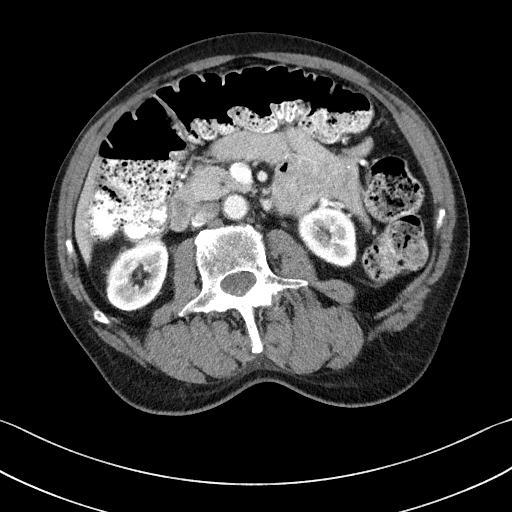
[im 71/131  mediastinal]
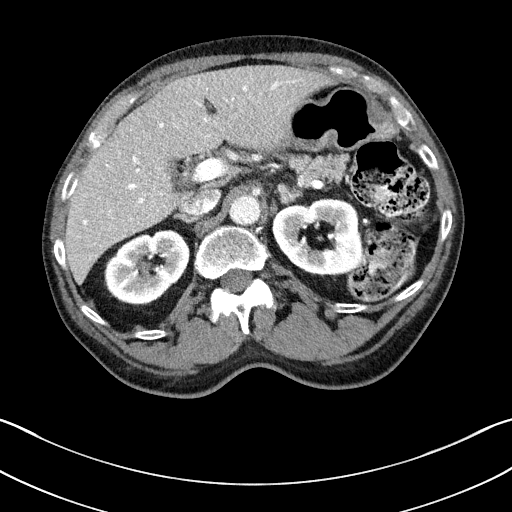
[im 83/131  mediastinal]
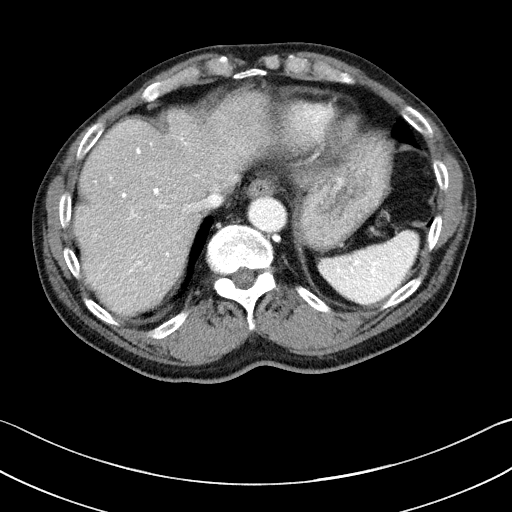
[im 95/131  mediastinal]
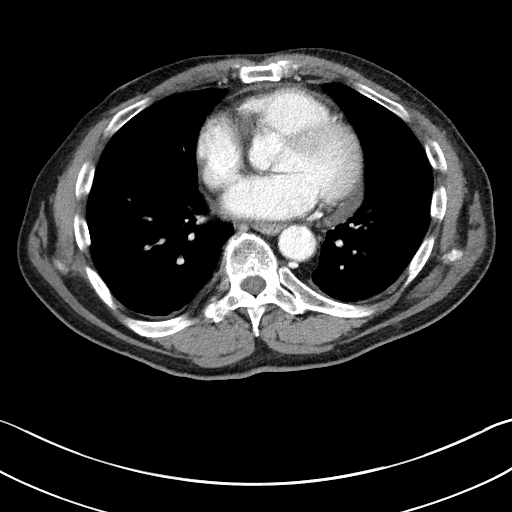
[im 107/131  mediastinal]
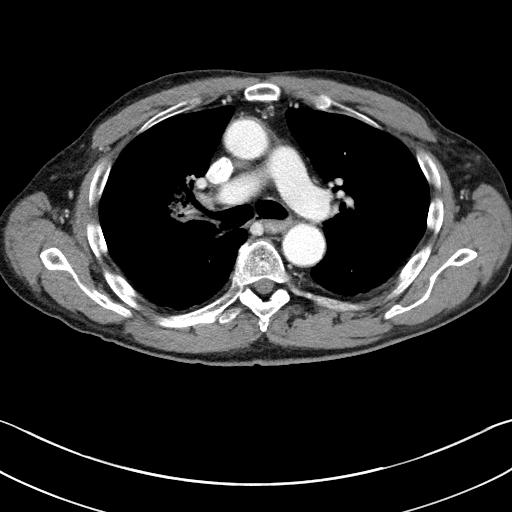
[im 107/131  bone]
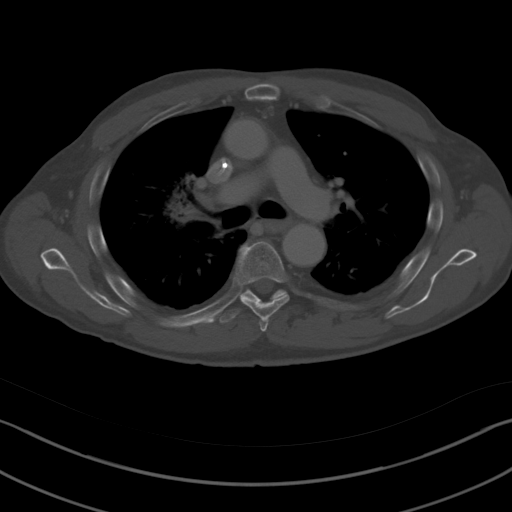
[im 119/131  mediastinal]
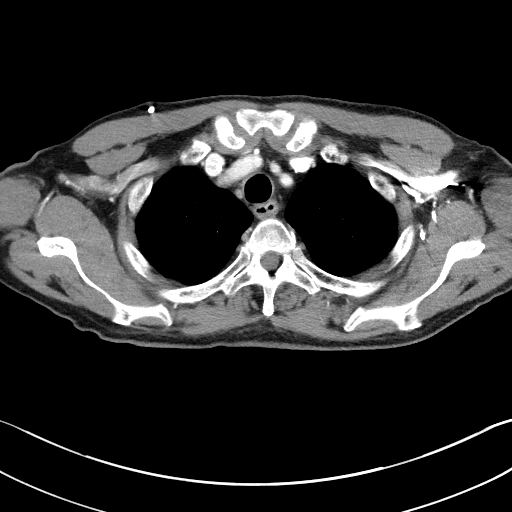

[Series 5: coronals · coronal · 0.75mm/px · 3 of 156 slices shown]
[im 32/156  mediastinal]
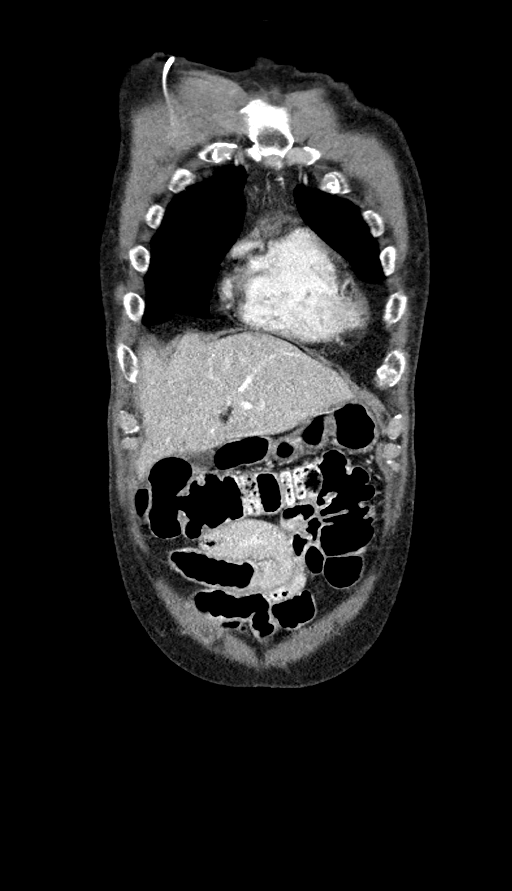
[im 63/156  mediastinal]
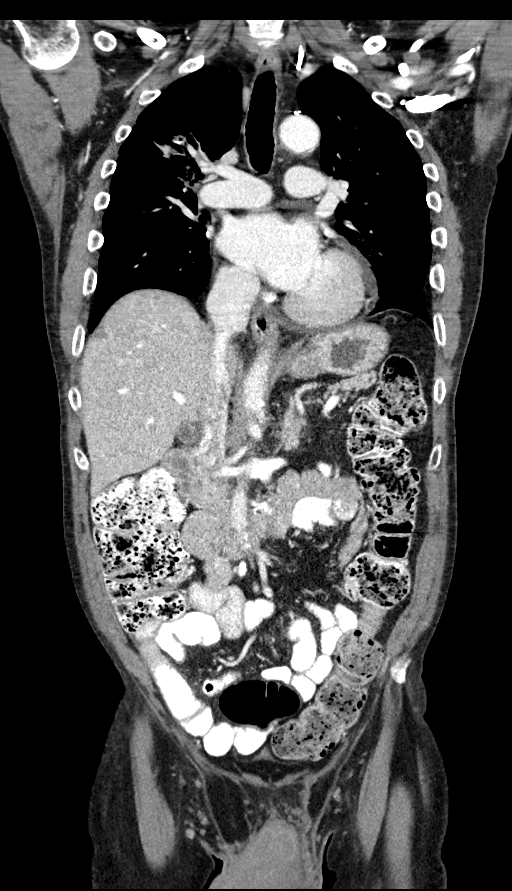
[im 94/156  mediastinal]
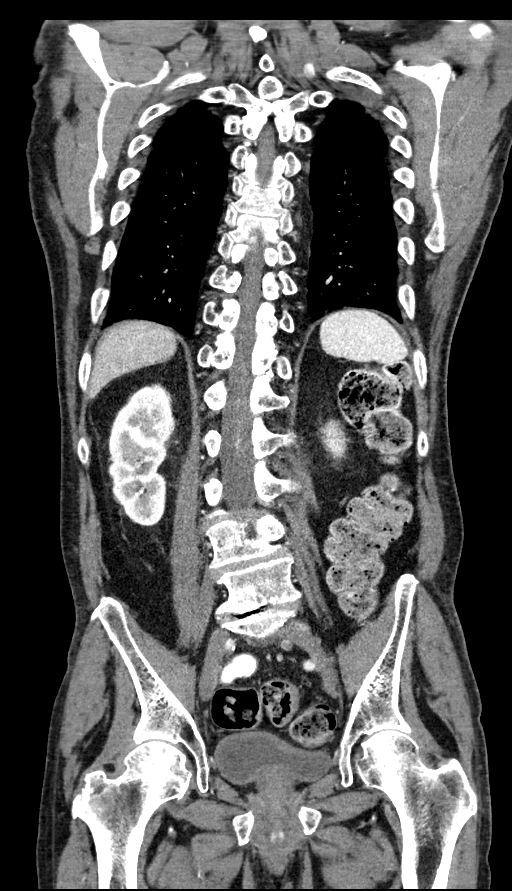

[13 of 36 positions shown; findings below may reference images not displayed]

RADIATION DOSE REDUCTION: This exam was performed according to the
departmental dose-optimization program which includes automated
exposure control, adjustment of the mA and/or kV according to
patient size and/or use of iterative reconstruction technique.

CONTRAST:  100mL OMNIPAQUE IOHEXOL 300 MG/ML  SOLN
FINDINGS: CT CHEST FINDINGS

Cardiovascular: Right chest Port-A-Cath with tip near the superior
cavoatrial junction. Aortic atherosclerosis without aneurysmal
dilation. No central pulmonary embolus on this nondedicated study.
Normal size heart. Small pericardial effusion slightly increased
from prior.

Mediastinum/Nodes: No supraclavicular adenopathy. Stable
multinodular goiter.No pathologically enlarged mediastinal, hilar or
axillary lymph nodes. The trachea and esophagus are grossly
unremarkable.

Lungs/Pleura: Similar ill-defined opacity in the central right upper
lobe with associated mild to moderate central bronchiectasis without
suspicious new nodular areas, consistent with post radiation change.
No new suspicious pulmonary nodules or masses. No pleural effusion.
No pneumothorax.

Musculoskeletal: No chest wall mass or suspicious bone lesions
identified.

CT ABDOMEN PELVIS FINDINGS

Hepatobiliary: No suspicious hepatic lesion. Gallbladder is
unremarkable. No biliary ductal dilation.

Pancreas: No pancreatic ductal dilation or evidence of acute
inflammation.

Spleen: Normal in size without focal abnormality.

Adrenals/Urinary Tract: Stable 13 mm right adrenal nodule. No
hydronephrosis. Kidneys demonstrate symmetric enhancement excretion
of contrast material. No solid enhancing renal mass. Urinary bladder
is unremarkable for degree of distension.

Stomach/Bowel: Radiopaque enteric contrast material traverses the
hepatic flexure. Stomach is minimally distended limiting evaluation.
No pathologic dilation of small or large bowel. Terminal ileum
appears normal. Large colonic stool burden suggestive of
constipation. No evidence of acute bowel inflammation.

Vascular/Lymphatic: Aortic and branch vessel atherosclerosis without
abdominal aortic aneurysm. No pathologically enlarged abdominal or
pelvic lymph nodes.

Reproductive: Prostate is unremarkable.

Other: No significant abdominopelvic free fluid. No discrete
peritoneal or omental nodularity. Tiny fat containing paraumbilical
hernia.

Musculoskeletal: Mild dextroconvex curvature of the thoracolumbar
spine. Multilevel degenerative changes spine. No aggressive lytic or
blastic lesion of bone.
IMPRESSION: 1. Stable examination without evidence of new or progressive disease
in the chest abdomen or pelvis to suggest recurrence or metastases.
2. Stable post radiation change in the central right upper lobe.
3. Stable 13 mm right adrenal nodule.
4. Large colonic stool burden suggestive of constipation.
5. Small pericardial effusion, slightly increased from prior exam,
possibly post treatment related consider further evaluation with
echocardiography.
6. Aortic Atherosclerosis ([HJ]-[HJ]).

## 2021-06-17 MED ORDER — IOHEXOL 300 MG/ML  SOLN
100.0000 mL | Freq: Once | INTRAMUSCULAR | Status: AC | PRN
  Administered 2021-06-17: 100 mL via INTRAVENOUS

## 2021-06-17 MED ORDER — SODIUM CHLORIDE (PF) 0.9 % IJ SOLN
INTRAMUSCULAR | Status: AC
  Filled 2021-06-17: qty 50

## 2021-06-18 ENCOUNTER — Telehealth: Payer: Self-pay | Admitting: *Deleted

## 2021-06-18 NOTE — Telephone Encounter (Signed)
Notified of message below

## 2021-06-18 NOTE — Telephone Encounter (Signed)
-----   Message from Orson Slick, MD sent at 06/18/2021  2:30 PM EST ----- Please let Benjamin Willis know that his CT chest abdomen pelvis showed no evidence of new or progressive spread of his lung cancer.  We will continue treatment as planned. ----- Message ----- From: Interface, Rad Results In Sent: 06/18/2021  11:57 AM EST To: Orson Slick, MD

## 2021-06-23 ENCOUNTER — Other Ambulatory Visit: Payer: Self-pay | Admitting: Emergency Medicine

## 2021-06-24 ENCOUNTER — Other Ambulatory Visit: Payer: Self-pay | Admitting: *Deleted

## 2021-06-24 DIAGNOSIS — Z95828 Presence of other vascular implants and grafts: Secondary | ICD-10-CM

## 2021-06-25 ENCOUNTER — Other Ambulatory Visit: Payer: Self-pay | Admitting: Emergency Medicine

## 2021-06-25 DIAGNOSIS — E1159 Type 2 diabetes mellitus with other circulatory complications: Secondary | ICD-10-CM

## 2021-06-25 DIAGNOSIS — I152 Hypertension secondary to endocrine disorders: Secondary | ICD-10-CM

## 2021-06-28 ENCOUNTER — Inpatient Hospital Stay: Payer: Medicare HMO

## 2021-06-28 ENCOUNTER — Inpatient Hospital Stay (HOSPITAL_BASED_OUTPATIENT_CLINIC_OR_DEPARTMENT_OTHER): Payer: Medicare HMO | Admitting: Hematology and Oncology

## 2021-06-28 ENCOUNTER — Other Ambulatory Visit: Payer: Self-pay

## 2021-06-28 VITALS — BP 133/88 | HR 81 | Temp 97.1°F | Resp 18 | Ht 66.0 in | Wt 163.2 lb

## 2021-06-28 DIAGNOSIS — C3490 Malignant neoplasm of unspecified part of unspecified bronchus or lung: Secondary | ICD-10-CM

## 2021-06-28 DIAGNOSIS — D701 Agranulocytosis secondary to cancer chemotherapy: Secondary | ICD-10-CM | POA: Diagnosis not present

## 2021-06-28 DIAGNOSIS — Z95828 Presence of other vascular implants and grafts: Secondary | ICD-10-CM | POA: Diagnosis not present

## 2021-06-28 DIAGNOSIS — D6481 Anemia due to antineoplastic chemotherapy: Secondary | ICD-10-CM | POA: Diagnosis not present

## 2021-06-28 DIAGNOSIS — E032 Hypothyroidism due to medicaments and other exogenous substances: Secondary | ICD-10-CM

## 2021-06-28 DIAGNOSIS — R531 Weakness: Secondary | ICD-10-CM | POA: Diagnosis not present

## 2021-06-28 DIAGNOSIS — Z79899 Other long term (current) drug therapy: Secondary | ICD-10-CM | POA: Diagnosis not present

## 2021-06-28 DIAGNOSIS — C3411 Malignant neoplasm of upper lobe, right bronchus or lung: Secondary | ICD-10-CM | POA: Diagnosis not present

## 2021-06-28 DIAGNOSIS — Z5112 Encounter for antineoplastic immunotherapy: Secondary | ICD-10-CM | POA: Diagnosis not present

## 2021-06-28 DIAGNOSIS — Z5111 Encounter for antineoplastic chemotherapy: Secondary | ICD-10-CM | POA: Diagnosis not present

## 2021-06-28 DIAGNOSIS — C7931 Secondary malignant neoplasm of brain: Secondary | ICD-10-CM

## 2021-06-28 DIAGNOSIS — Z7952 Long term (current) use of systemic steroids: Secondary | ICD-10-CM | POA: Diagnosis not present

## 2021-06-28 LAB — CBC WITH DIFFERENTIAL (CANCER CENTER ONLY)
Abs Immature Granulocytes: 0.02 10*3/uL (ref 0.00–0.07)
Basophils Absolute: 0 10*3/uL (ref 0.0–0.1)
Basophils Relative: 0 %
Eosinophils Absolute: 0 10*3/uL (ref 0.0–0.5)
Eosinophils Relative: 0 %
HCT: 34.7 % — ABNORMAL LOW (ref 39.0–52.0)
Hemoglobin: 11.2 g/dL — ABNORMAL LOW (ref 13.0–17.0)
Immature Granulocytes: 0 %
Lymphocytes Relative: 10 %
Lymphs Abs: 0.5 10*3/uL — ABNORMAL LOW (ref 0.7–4.0)
MCH: 31.9 pg (ref 26.0–34.0)
MCHC: 32.3 g/dL (ref 30.0–36.0)
MCV: 98.9 fL (ref 80.0–100.0)
Monocytes Absolute: 0.3 10*3/uL (ref 0.1–1.0)
Monocytes Relative: 7 %
Neutro Abs: 4.1 10*3/uL (ref 1.7–7.7)
Neutrophils Relative %: 83 %
Platelet Count: 225 10*3/uL (ref 150–400)
RBC: 3.51 MIL/uL — ABNORMAL LOW (ref 4.22–5.81)
RDW: 12.7 % (ref 11.5–15.5)
WBC Count: 5 10*3/uL (ref 4.0–10.5)
nRBC: 0 % (ref 0.0–0.2)

## 2021-06-28 LAB — CMP (CANCER CENTER ONLY)
ALT: 35 U/L (ref 0–44)
AST: 24 U/L (ref 15–41)
Albumin: 4.1 g/dL (ref 3.5–5.0)
Alkaline Phosphatase: 79 U/L (ref 38–126)
Anion gap: 7 (ref 5–15)
BUN: 11 mg/dL (ref 8–23)
CO2: 28 mmol/L (ref 22–32)
Calcium: 9.4 mg/dL (ref 8.9–10.3)
Chloride: 104 mmol/L (ref 98–111)
Creatinine: 0.82 mg/dL (ref 0.61–1.24)
GFR, Estimated: >60 mL/min (ref >60)
Glucose, Bld: 278 mg/dL — ABNORMAL HIGH (ref 70–99)
Potassium: 4.1 mmol/L (ref 3.5–5.1)
Sodium: 139 mmol/L (ref 135–145)
Total Bilirubin: 0.3 mg/dL (ref 0.3–1.2)
Total Protein: 6.8 g/dL (ref 6.5–8.1)

## 2021-06-28 LAB — TSH: TSH: 0.645 u[IU]/mL (ref 0.320–4.118)

## 2021-06-28 MED ORDER — SODIUM CHLORIDE 0.9 % IV SOLN
200.0000 mg | Freq: Once | INTRAVENOUS | Status: AC
  Administered 2021-06-28: 200 mg via INTRAVENOUS
  Filled 2021-06-28: qty 200

## 2021-06-28 MED ORDER — SODIUM CHLORIDE 0.9 % IV SOLN: Freq: Once | INTRAVENOUS | Status: AC

## 2021-06-28 MED ORDER — SODIUM CHLORIDE 0.9% FLUSH
10.0000 mL | Freq: Once | INTRAVENOUS | Status: AC
  Administered 2021-06-28: 10 mL

## 2021-06-28 MED ORDER — SODIUM CHLORIDE 0.9 % IV SOLN
500.0000 mg/m2 | Freq: Once | INTRAVENOUS | Status: AC
  Administered 2021-06-28: 900 mg via INTRAVENOUS
  Filled 2021-06-28: qty 20

## 2021-06-28 MED ORDER — HEPARIN SOD (PORK) LOCK FLUSH 100 UNIT/ML IV SOLN
500.0000 [IU] | Freq: Once | INTRAVENOUS | Status: AC | PRN
  Administered 2021-06-28: 500 [IU]

## 2021-06-28 MED ORDER — PROCHLORPERAZINE MALEATE 10 MG PO TABS
10.0000 mg | ORAL_TABLET | Freq: Once | ORAL | Status: AC
  Administered 2021-06-28: 10 mg via ORAL
  Filled 2021-06-28: qty 1

## 2021-06-28 MED ORDER — SODIUM CHLORIDE 0.9% FLUSH
10.0000 mL | INTRAVENOUS | Status: DC | PRN
  Administered 2021-06-28: 10 mL

## 2021-06-28 NOTE — Patient Instructions (Signed)
[facility]  Discharge Instructions: Thank you for choosing [facility] to provide your oncology and hematology care.   If you have a lab appointment with the Cancer Center, please go directly to the Cancer Center and check in at the registration area.   Wear comfortable clothing and clothing appropriate for easy access to any Portacath or PICC line.   We strive to give you quality time with your provider. You may need to reschedule your appointment if you arrive late (15 or more minutes).  Arriving late affects you and other patients whose appointments are after yours.  Also, if you miss three or more appointments without notifying the office, you may be dismissed from the clinic at the provider's discretion.      For prescription refill requests, have your pharmacy contact our office and allow 72 hours for refills to be completed.    Today you received the following chemotherapy and/or immunotherapy agents: Keytruda/Alimta.      To help prevent nausea and vomiting after your treatment, we encourage you to take your nausea medication as directed.  BELOW ARE SYMPTOMS THAT SHOULD BE REPORTED IMMEDIATELY: *FEVER GREATER THAN 100.4 F (38 C) OR HIGHER *CHILLS OR SWEATING *NAUSEA AND VOMITING THAT IS NOT CONTROLLED WITH YOUR NAUSEA MEDICATION *UNUSUAL SHORTNESS OF BREATH *UNUSUAL BRUISING OR BLEEDING *URINARY PROBLEMS (pain or burning when urinating, or frequent urination) *BOWEL PROBLEMS (unusual diarrhea, constipation, pain near the anus) TENDERNESS IN MOUTH AND THROAT WITH OR WITHOUT PRESENCE OF ULCERS (sore throat, sores in mouth, or a toothache) UNUSUAL RASH, SWELLING OR PAIN  UNUSUAL VAGINAL DISCHARGE OR ITCHING   Items with * indicate a potential emergency and should be followed up as soon as possible or go to the Emergency Department if any problems should occur.  Please show the CHEMOTHERAPY ALERT CARD or IMMUNOTHERAPY ALERT CARD at  check-in to the Emergency Department and triage nurse.  Should you have questions after your visit or need to cancel or reschedule your appointment, please contact [facility]  Dept: [phone]  and follow the prompts.  Office hours are 8:00 a.m. to 4:30 p.m. Monday - Friday. Please note that voicemails left after 4:00 p.m. may not be returned until the following business day.  We are closed weekends and major holidays. You have access to a nurse at all times for urgent questions. Please call the main number to the clinic Dept: [phone] and follow the prompts.   For any non-urgent questions, you may also contact your provider using MyChart. We now offer e-Visits for anyone 18 and older to request care online for non-urgent symptoms. For details visit [email or web address].   Also download the MyChart app! Go to the app store, search "MyChart", open the app, select English, and log in with your MyChart username and password.  Due to Covid, a mask is required upon entering the hospital/clinic. If you do not have a mask, one will be given to you upon arrival. For doctor visits, patients may have 1 support person aged 18 or older with them. For treatment visits, patients cannot have anyone with them due to current Covid guidelines and our immunocompromised population.   

## 2021-07-01 ENCOUNTER — Encounter: Payer: Self-pay | Admitting: Hematology and Oncology

## 2021-07-01 NOTE — Progress Notes (Signed)
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  This was followed by an MRI of the brain with and without contrast which showed 4 brain lesions consistent with metastases, extensive vasogenic edema associated with the posterior right frontal lesion without midline shift. 6/15-6/28/2022: 10 fractions of WBRT with Rad/Onc 11/23/2020: Guardant 360 returns with no targetable mutation.  12/14/2020: Cycle 1 Day 1 of Carbo/Pem/Pem 01/04/2021: Cycle 2 Day 1 of Carbo/Pem/Pem 01/25/2021: Cycle 3 Day 1 of Carbo/Pem/Pem 02/15/2021: Cycle 4 Day 1 of Carbo/Pem/Pem 03/08/2021: Cycle 5 Day 1 of maintenance Pem/Pem 03/29/2021: Cycle 6 Day 1 of maintenance Pem/Pem 04/19/2021: Cycle 7 Day 1 of maintenance Pem/Pem 05/16/2021: Cycle 8 Day 1 of maintenance Pem/Pem 06/07/2021: Cycle 9 Day 1 of maintenance Pem/Pem 06/27/2021: Cycle 10 Day 1 of maintenance Pem/Pem  Interval History:  Benjamin Willis. 76 y.o. 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 The patient's last visit was on 06/27/2021.  In the interim since the last visit he has completed Cycle 9 of maintenance chemotherapy. He presents today for Cycle 10 Day 1.   On exam today Benjamin Willis is unaccompanied.  He reports he has been well in the interim  since her last visit.  He notes that he is eating quite well and had a delicious pair last night.  He notes that he is walking at least twice a day and tends to use his roller walker to walk.  He tends to go a block or 2 down his street.  He notes his appetite is good and he has no difficulty with nausea or vomiting.  His energy levels are excellent and he is not having any trouble with headaches, vision changes, lightheadedness, or dizziness.  He notes that he is doing "A okay".  He has no questions concerns or complaints today.  He currently denies any issues with fevers, chills, sweats, nausea, ming or diarrhea.  A full 10 point ROS is listed below.  MEDICAL HISTORY:  Past Medical History:  Diagnosis Date   Allergy    Diabetes mellitus without complication ([place])    Type II   Glaucoma 10/16/2020   History of radiation therapy    [place]- SBRT 02/21/19-02/28/19, Rt Lung- IMRT 02/29/20-04/16/20 Dr. Gery Pray   History of radiation therapy 10/30/2020   whole brain 10/17/2020-10/30/2020 Dr Gery Pray   Hyperlipidemia    Hypertension    Lung cancer ([place])    Stage IA2 (cT1b, N0) non-small cell lung cancer (adenocarcinoma)    SURGICAL HISTORY: Past Surgical History:  Procedure Laterality Date   ELECTROMAGNETIC NAVIGATION BROCHOSCOPY Right 02/02/2019   Procedure: VIDEO BRONCHOSCOPY WITH NAVIGATION AND FIDUCIAL PLACEMENT;  Surgeon: Garner Nash, DO;  Location: [facility];  Service: Cardiopulmonary;  Laterality: Right;   FUDUCIAL PLACEMENT Right 02/02/2019   Procedure: Placement Of Fuducial Right Upper Lobe;  Surgeon: Garner Nash, DO;  Location: [facility];  Service: Cardiopulmonary;  Laterality: Right;   IR IMAGING GUIDED PORT INSERTION  12/13/2020   none     VIDEO BRONCHOSCOPY WITH RADIAL ENDOBRONCHIAL ULTRASOUND Right 02/02/2019   Procedure: Video Bronchoscopy With Radial Endobronchial Ultrasound;  Surgeon: Garner Nash, DO;  Location: [facility];  Service: Cardiopulmonary;  Laterality: Right;     SOCIAL HISTORY: Social History   Socioeconomic History   Marital status: Single    Spouse name: Not on file   Number of children: 0   Years of education: Not on file   Highest education level: 9th grade  Occupational History   Not on file  Tobacco Use   Smoking status: Former    Packs/day: 1.00    Years: 48.00    Pack years: 48.00    Types: Cigarettes    Quit date: 02/20/2015    Years since quitting: 6.3   Smokeless tobacco: Former    Quit date: 10/2013  Vaping Use   Vaping Use: Never used  Substance and Sexual Activity   Alcohol use: No   Drug use: No   Sexual activity: Yes    Birth control/protection: Condom  Other Topics Concern   Not on file  Social History Narrative   Not on file   Social Determinants of Health   Financial Resource Strain: Not on file  Food Insecurity: Not on file  Transportation Needs: Not on file  Physical Activity: Not on file  Stress: Not on file  Social Connections: Not on file  Intimate Partner Violence: Not on file    FAMILY HISTORY: Family History  Problem Relation Age of Onset   Lung cancer Neg Hx     ALLERGIES:  has No Known Allergies.  MEDICATIONS:  Current Outpatient Medications  Medication Sig Dispense Refill   albuterol (VENTOLIN HFA) 108 (90 Base) MCG/ACT inhaler Inhale 2 puffs into the lungs every 6 (six) hours as needed for wheezing or shortness of breath. 18 g 6   amLODipine (NORVASC) 5 MG tablet Take 5 mg by mouth daily.     ammonium lactate (AMLACTIN) 12 % lotion Apply 1 application topically as needed for dry skin. 400 g 0   atorvastatin (LIPITOR) 20 MG tablet Take 20 mg by mouth daily.     blood glucose meter kit and supplies KIT Dispense based on patient and insurance preference. Use up to four times daily as directed. 1 each 0   Blood Pressure Monitoring (BLOOD PRESSURE MONITOR/ARM) DEVI Check BP once a day. DX I 10 1 each 0   dexamethasone (DECADRON) 2 MG tablet Take 1 tablet (2 mg total) by mouth daily.  60 tablet 1   erythromycin ophthalmic ointment Place 1 application into the left eye at bedtime. 3.5 g 0   folic acid (FOLVITE) 1 MG tablet Take 1 tablet (1 mg total) by mouth daily. 90 tablet 1   gabapentin (NEURONTIN) 300 MG capsule Take 1 capsule by mouth at bedtime 90 capsule 0   glipiZIDE (GLUCOTROL) 5 MG tablet TAKE 1 TABLET BY MOUTH TWICE DAILY WITH A MEAL 180 tablet 0   latanoprost (XALATAN) 0.005 % ophthalmic solution Place 1 drop into both eyes at bedtime. 2.5 mL 3   levETIRAcetam (KEPPRA) 1000 MG tablet Take 1 tablet by mouth twice daily 180 tablet 1   lidocaine-prilocaine (EMLA) cream Apply 1 application topically as needed. (Patient taking differently: Apply 1 application topically as needed (for port before tx).) 30 g 0   metFORMIN (GLUCOPHAGE) 500 MG tablet TAKE 1 TABLET BY MOUTH TWICE DAILY  WITH MEALS 180 tablet 0   Multiple Vitamins-Minerals (MULTIVITAMIN MEN) TABS Take 1 tablet by mouth daily.     ondansetron (ZOFRAN) 8 MG tablet Take 1 tablet (8 mg total) by mouth every 8 (eight) hours as needed for nausea or vomiting. 30 tablet 0   pantoprazole (PROTONIX) 40 MG tablet Take 1 tablet (40 mg total) by mouth daily. 90 tablet 1   potassium chloride SA (KLOR-CON) 20 MEQ tablet Take 20 mEq by mouth daily.     prochlorperazine (COMPAZINE) 10 MG tablet Take 1 tablet (10 mg total) by mouth every 6 (six) hours as needed for nausea or vomiting. 30 tablet 0   sitaGLIPtin (JANUVIA) 100 MG tablet Take 1 tablet (100 mg total) by mouth daily. 90 tablet 3   No current facility-administered medications for this visit.    REVIEW OF SYSTEMS:   Constitutional: ( - ) fevers, ( - )  chills , ( - ) night sweats Eyes: ( - ) blurriness of vision, ( - ) double vision, ( - ) watery eyes Ears, nose, mouth, throat, and face: ( - ) mucositis, ( - ) sore throat Respiratory: ( - ) cough, ( - ) dyspnea, ( - ) wheezes Cardiovascular: ( - ) palpitation, ( - ) chest discomfort, ( - ) lower extremity  swelling Gastrointestinal:  ( - ) nausea, ( - ) heartburn, ( - ) change in bowel habits Skin: ( - ) abnormal skin rashes Lymphatics: ( - ) new lymphadenopathy, ( - ) easy bruising Neurological: ( - ) numbness, ( - ) tingling, ( - ) new weaknesses Behavioral/Psych: ( - ) mood change, ( - ) new changes  All other systems were reviewed with the patient and are negative.  PHYSICAL EXAMINATION:  Vitals:   06/28/21 1431  BP: 133/88  Pulse: 81  Resp: 18  Temp: (!) 97.1 F (36.2 C)  SpO2: 98%     Filed Weights   06/28/21 1431  Weight: 163 lb 3.2 oz (74 kg)     GENERAL: Well-appearing elderly African-American male, alert, no distress and comfortable SKIN:  Skin color, texture, turgor are normal, no rashes or significant lesions EYES: conjunctiva are pink and non-injected, sclera clear LUNGS: clear to auscultation and percussion with normal breathing effort HEART: regular rate & rhythm and no murmurs and no lower extremity edema PSYCH: alert & oriented x 3, fluent speech NEURO: No focal deficits. No facial droop.   LABORATORY DATA:  I have reviewed the data as listed CBC Latest Ref Rng & Units 06/28/2021 06/07/2021 05/16/2021  WBC 4.0 - 10.5 K/uL 5.0 6.7 6.3  Hemoglobin 13.0 - 17.0 g/dL 11.2(L) 11.5(L) 11.5(L)  Hematocrit 39.0 - 52.0 % 34.7(L) 34.0(L) 34.7(L)  Platelets 150 - 400 K/uL 225 228 240    CMP Latest Ref Rng & Units 06/28/2021 06/07/2021 05/16/2021  Glucose 70 - 99 mg/dL 278(H) 311(H) 170(H)  BUN 8 - 23 mg/dL _0 Creatinine 0.61 - 1.24 mg/dL 0.82 0.86 0.78  Sodium 135 - 145 mmol/L 139 138 142  Potassium 3.5 - 5.1 mmol/L 4.1 3.8 3.9  Chloride 98 - 111 mmol/L 104 103 106  CO2 22 - 32 mmol/L _1 Calcium 8.9 - 10.3 mg/dL 9.4 9.1 9.3  Total Protein 6.5 - 8.1 g/dL 6.8 6.7 6.8  Total Bilirubin 0.3 - 1.2 mg/dL 0.3 0.4 0.3  Alkaline Phos 38 - 126 U/L 79 65 68  AST 15 - 41 U/L _2 ALT 0 -  44 U/L 35 67(H) 33    RADIOGRAPHIC STUDIES:  CT CHEST ABDOMEN  PELVIS W CONTRAST  Result Date: 06/18/2021 CLINICAL DATA:  History of metastatic non-small cell lung cancer, previous radiation therapy ongoing chemotherapy. EXAM: CT CHEST, ABDOMEN, AND PELVIS WITH CONTRAST TECHNIQUE: Multidetector CT imaging of the chest, abdomen and pelvis was performed following the standard protocol during bolus administration of intravenous contrast. RADIATION DOSE REDUCTION: This exam was performed according to the departmental dose-optimization program which includes automated exposure control, adjustment of the mA and/or kV according to patient size and/or use of iterative reconstruction technique. CONTRAST:  148m OMNIPAQUE IOHEXOL 300 MG/ML  SOLN COMPARISON:  Multiple priors including most recent CT March 05, 2021 FINDINGS: CT CHEST FINDINGS Cardiovascular: Right chest Port-A-Cath with tip near the superior cavoatrial junction. Aortic atherosclerosis without aneurysmal dilation. No central pulmonary embolus on this nondedicated study. Normal size heart. Small pericardial effusion slightly increased from prior. Mediastinum/Nodes: No supraclavicular adenopathy. Stable multinodular goiter.No pathologically enlarged mediastinal, hilar or axillary lymph nodes. The trachea and esophagus are grossly unremarkable. Lungs/Pleura: Similar ill-defined opacity in the central right upper lobe with associated mild to moderate central bronchiectasis without suspicious new nodular areas, consistent with post radiation change. No new suspicious pulmonary nodules or masses. No pleural effusion. No pneumothorax. Musculoskeletal: No chest wall mass or suspicious bone lesions identified. CT ABDOMEN PELVIS FINDINGS Hepatobiliary: No suspicious hepatic lesion. Gallbladder is unremarkable. No biliary ductal dilation. Pancreas: No pancreatic ductal dilation or evidence of acute inflammation. Spleen: Normal in size without focal abnormality. Adrenals/Urinary Tract: Stable 13 mm right adrenal nodule. No  hydronephrosis. Kidneys demonstrate symmetric enhancement excretion of contrast material. No solid enhancing renal mass. Urinary bladder is unremarkable for degree of distension. Stomach/Bowel: Radiopaque enteric contrast material traverses the hepatic flexure. Stomach is minimally distended limiting evaluation. No pathologic dilation of small or large bowel. Terminal ileum appears normal. Large colonic stool burden suggestive of constipation. No evidence of acute bowel inflammation. Vascular/Lymphatic: Aortic and branch vessel atherosclerosis without abdominal aortic aneurysm. No pathologically enlarged abdominal or pelvic lymph nodes. Reproductive: Prostate is unremarkable. Other: No significant abdominopelvic free fluid. No discrete peritoneal or omental nodularity. Tiny fat containing paraumbilical hernia. Musculoskeletal: Mild dextroconvex curvature of the thoracolumbar spine. Multilevel degenerative changes spine. No aggressive lytic or blastic lesion of bone. IMPRESSION: 1. Stable examination without evidence of new or progressive disease in the chest abdomen or pelvis to suggest recurrence or metastases. 2. Stable post radiation change in the central right upper lobe. 3. Stable 13 mm right adrenal nodule. 4. Large colonic stool burden suggestive of constipation. 5. Small pericardial effusion, slightly increased from prior exam, possibly post treatment related consider further evaluation with echocardiography. 6. Aortic Atherosclerosis (ICD10-I70.0). Electronically Signed   By: JDahlia BailiffM.D.   On: 06/18/2021 11:54    ASSESSMENT & PLAN Benjamin Willis 76y.o. 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  After review the labs, reviewed records, discussion with the patient the findings are most consistent with metastatic adenocarcinoma with spread to the brain.  Unfortunately we do not have new pathology  as the lesions in his brain were not resectable and he underwent radiation therapy instead.  We can presume based off his prior pathology that this represents the original adenocarcinoma.  As such I do believe it is reasonable to proceed with carboplatin, pembrolizumab, pemetrexed therapy in order to help treat any possible micrometastatic disease.  The patient also underwent whole brain radiation therapy instead of SRS.  Therefore I do believe systemic therapy will be reasonable.  This plan of care was discussed with the resident lung expert Dr. Julien Nordmann. Additionally our [facility] 360 testing was negative for a targetable mutation.   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 Lost to follow up --presents with brain metastasis only. No evidence of disease in the C/A/P noted on CT scan from 03/05/2021.  --Patient completed whole brain radiation, but did not wish to undergo SRS.  Radiation oncology noted that this may be an option in the future if he were to have recurrence in the brain --Guardant 360 on 11/01/2020 showed no targetable mutations.  Plan: --Labs today were reviewed. No intervention required and adequate to proceed with treatment.  --plan to proceed with maintenance Pem/Pem chemotherapy, Cycle 10 Day 1 today. --Plan for CT in 3 months (May 2023) --continue to follow with neuro-oncology for brain metastasis --RTC in 3 weeks for Cycle 11 of chemotherapy with maintenance pembrolizumab/pemetrexed.   #Left leg weakness --Improved with corticosteroid therapy, continue on dexamethasone 2 mg daily  --MRI ordered by neurology. Scheduled for 07/12/2021 --Continue to monitor.  #Chemotherapy Induced Anemia-stable #Chemotherapy Induced Leukopenia-improved  --today WBC 5.0 and Hgb 11.2. ANC 4.1. --continue to monitor   #Supportive Care -- chemotherapy education complete  -- port  placed --daily PO 52m folic acid with pemetrexed. -- zofran [id number] PRN and compazine 1066mO q6H for nausea -- EMLA cream for port -- no pain medication required at this time.   No orders of the defined types were placed in this encounter.  All questions were answered. The patient knows to call the clinic with any problems, questions or concerns.  I have spent a total of 30 minutes minutes of face-to-face and non-face-to-face time, preparing to see the patient, performing a medically appropriate examination, counseling and educating the patient, ordering medications, referring and communicating with other health care professionals, documenting clinical information in the electronic health record, and care coordination.   JohLedell PeoplesD Department of Hematology/Oncology [place] [facility] LLCone: [phone]: [phone]: [email or web address]  07/01/2021 5:52 PM

## 2021-07-02 ENCOUNTER — Emergency Department (HOSPITAL_COMMUNITY): Payer: Medicare HMO

## 2021-07-02 ENCOUNTER — Other Ambulatory Visit: Payer: Self-pay

## 2021-07-02 ENCOUNTER — Encounter (HOSPITAL_COMMUNITY): Payer: Self-pay

## 2021-07-02 ENCOUNTER — Inpatient Hospital Stay (HOSPITAL_COMMUNITY)
Admission: EM | Admit: 2021-07-02 | Discharge: 2021-07-15 | DRG: 080 | Disposition: A | Discharge status: Skilled Nursing Facility | Payer: Medicare HMO | Attending: Internal Medicine | Admitting: Internal Medicine

## 2021-07-02 DIAGNOSIS — R29898 Other symptoms and signs involving the musculoskeletal system: Secondary | ICD-10-CM

## 2021-07-02 DIAGNOSIS — E119 Type 2 diabetes mellitus without complications: Secondary | ICD-10-CM

## 2021-07-02 DIAGNOSIS — R2689 Other abnormalities of gait and mobility: Secondary | ICD-10-CM | POA: Diagnosis not present

## 2021-07-02 DIAGNOSIS — I10 Essential (primary) hypertension: Secondary | ICD-10-CM | POA: Diagnosis not present

## 2021-07-02 DIAGNOSIS — R531 Weakness: Principal | ICD-10-CM

## 2021-07-02 DIAGNOSIS — E46 Unspecified protein-calorie malnutrition: Secondary | ICD-10-CM | POA: Diagnosis not present

## 2021-07-02 DIAGNOSIS — Z20822 Contact with and (suspected) exposure to covid-19: Secondary | ICD-10-CM | POA: Diagnosis present

## 2021-07-02 DIAGNOSIS — R4189 Other symptoms and signs involving cognitive functions and awareness: Secondary | ICD-10-CM | POA: Diagnosis not present

## 2021-07-02 DIAGNOSIS — E11649 Type 2 diabetes mellitus with hypoglycemia without coma: Secondary | ICD-10-CM | POA: Diagnosis not present

## 2021-07-02 DIAGNOSIS — Y92009 Unspecified place in unspecified non-institutional (private) residence as the place of occurrence of the external cause: Secondary | ICD-10-CM

## 2021-07-02 DIAGNOSIS — W19XXXD Unspecified fall, subsequent encounter: Secondary | ICD-10-CM | POA: Diagnosis not present

## 2021-07-02 DIAGNOSIS — M6281 Muscle weakness (generalized): Secondary | ICD-10-CM | POA: Diagnosis not present

## 2021-07-02 DIAGNOSIS — L299 Pruritus, unspecified: Secondary | ICD-10-CM | POA: Diagnosis present

## 2021-07-02 DIAGNOSIS — Z79899 Other long term (current) drug therapy: Secondary | ICD-10-CM

## 2021-07-02 DIAGNOSIS — D6481 Anemia due to antineoplastic chemotherapy: Secondary | ICD-10-CM | POA: Diagnosis not present

## 2021-07-02 DIAGNOSIS — D72819 Decreased white blood cell count, unspecified: Secondary | ICD-10-CM

## 2021-07-02 DIAGNOSIS — D701 Agranulocytosis secondary to cancer chemotherapy: Secondary | ICD-10-CM | POA: Diagnosis not present

## 2021-07-02 DIAGNOSIS — E1169 Type 2 diabetes mellitus with other specified complication: Secondary | ICD-10-CM | POA: Diagnosis present

## 2021-07-02 DIAGNOSIS — Z7189 Other specified counseling: Secondary | ICD-10-CM

## 2021-07-02 DIAGNOSIS — C3490 Malignant neoplasm of unspecified part of unspecified bronchus or lung: Secondary | ICD-10-CM | POA: Diagnosis present

## 2021-07-02 DIAGNOSIS — W19XXXA Unspecified fall, initial encounter: Secondary | ICD-10-CM | POA: Diagnosis not present

## 2021-07-02 DIAGNOSIS — B029 Zoster without complications: Secondary | ICD-10-CM | POA: Diagnosis not present

## 2021-07-02 DIAGNOSIS — I614 Nontraumatic intracerebral hemorrhage in cerebellum: Secondary | ICD-10-CM | POA: Diagnosis not present

## 2021-07-02 DIAGNOSIS — W06XXXA Fall from bed, initial encounter: Secondary | ICD-10-CM | POA: Diagnosis present

## 2021-07-02 DIAGNOSIS — C7931 Secondary malignant neoplasm of brain: Secondary | ICD-10-CM | POA: Diagnosis present

## 2021-07-02 DIAGNOSIS — T451X5A Adverse effect of antineoplastic and immunosuppressive drugs, initial encounter: Secondary | ICD-10-CM | POA: Diagnosis present

## 2021-07-02 DIAGNOSIS — E785 Hyperlipidemia, unspecified: Secondary | ICD-10-CM | POA: Diagnosis present

## 2021-07-02 DIAGNOSIS — Y842 Radiological procedure and radiotherapy as the cause of abnormal reaction of the patient, or of later complication, without mention of misadventure at the time of the procedure: Secondary | ICD-10-CM | POA: Diagnosis present

## 2021-07-02 DIAGNOSIS — Z7984 Long term (current) use of oral hypoglycemic drugs: Secondary | ICD-10-CM

## 2021-07-02 DIAGNOSIS — R000 Tachycardia, unspecified: Secondary | ICD-10-CM | POA: Diagnosis not present

## 2021-07-02 DIAGNOSIS — G9389 Other specified disorders of brain: Secondary | ICD-10-CM | POA: Diagnosis not present

## 2021-07-02 DIAGNOSIS — D72810 Lymphocytopenia: Secondary | ICD-10-CM | POA: Diagnosis not present

## 2021-07-02 DIAGNOSIS — H409 Unspecified glaucoma: Secondary | ICD-10-CM | POA: Diagnosis present

## 2021-07-02 DIAGNOSIS — C801 Malignant (primary) neoplasm, unspecified: Secondary | ICD-10-CM | POA: Diagnosis not present

## 2021-07-02 DIAGNOSIS — D708 Other neutropenia: Secondary | ICD-10-CM | POA: Diagnosis not present

## 2021-07-02 DIAGNOSIS — G936 Cerebral edema: Secondary | ICD-10-CM | POA: Diagnosis not present

## 2021-07-02 DIAGNOSIS — Z87891 Personal history of nicotine dependence: Secondary | ICD-10-CM

## 2021-07-02 DIAGNOSIS — Z515 Encounter for palliative care: Secondary | ICD-10-CM

## 2021-07-02 DIAGNOSIS — E1159 Type 2 diabetes mellitus with other circulatory complications: Secondary | ICD-10-CM

## 2021-07-02 DIAGNOSIS — Z7401 Bed confinement status: Secondary | ICD-10-CM | POA: Diagnosis not present

## 2021-07-02 DIAGNOSIS — E1165 Type 2 diabetes mellitus with hyperglycemia: Secondary | ICD-10-CM | POA: Diagnosis present

## 2021-07-02 LAB — URINALYSIS, ROUTINE W REFLEX MICROSCOPIC
Bacteria, UA: NONE SEEN
Bilirubin Urine: NEGATIVE
Glucose, UA: NEGATIVE mg/dL
Hgb urine dipstick: NEGATIVE
Ketones, ur: NEGATIVE mg/dL
Leukocytes,Ua: NEGATIVE
Nitrite: NEGATIVE
Protein, ur: 30 mg/dL — AB
Specific Gravity, Urine: 1.02 (ref 1.005–1.030)
pH: 6 (ref 5.0–8.0)

## 2021-07-02 LAB — CBG MONITORING, ED: Glucose-Capillary: 105 mg/dL — ABNORMAL HIGH (ref 70–99)

## 2021-07-02 LAB — CBC WITH DIFFERENTIAL/PLATELET
Abs Immature Granulocytes: 0.02 10*3/uL (ref 0.00–0.07)
Basophils Absolute: 0 10*3/uL (ref 0.0–0.1)
Basophils Relative: 0 %
Eosinophils Absolute: 0 10*3/uL (ref 0.0–0.5)
Eosinophils Relative: 0 %
HCT: 38.8 % — ABNORMAL LOW (ref 39.0–52.0)
Hemoglobin: 12.6 g/dL — ABNORMAL LOW (ref 13.0–17.0)
Immature Granulocytes: 1 %
Lymphocytes Relative: 6 %
Lymphs Abs: 0.2 10*3/uL — ABNORMAL LOW (ref 0.7–4.0)
MCH: 32.1 pg (ref 26.0–34.0)
MCHC: 32.5 g/dL (ref 30.0–36.0)
MCV: 98.7 fL (ref 80.0–100.0)
Monocytes Absolute: 0.1 10*3/uL (ref 0.1–1.0)
Monocytes Relative: 2 %
Neutro Abs: 3.1 10*3/uL (ref 1.7–7.7)
Neutrophils Relative %: 91 %
Platelets: 244 10*3/uL (ref 150–400)
RBC: 3.93 MIL/uL — ABNORMAL LOW (ref 4.22–5.81)
RDW: 12.4 % (ref 11.5–15.5)
WBC: 3.3 10*3/uL — ABNORMAL LOW (ref 4.0–10.5)
nRBC: 0.6 % — ABNORMAL HIGH (ref 0.0–0.2)

## 2021-07-02 LAB — COMPREHENSIVE METABOLIC PANEL
ALT: 40 U/L (ref 0–44)
AST: 38 U/L (ref 15–41)
Albumin: 3.9 g/dL (ref 3.5–5.0)
Alkaline Phosphatase: 80 U/L (ref 38–126)
Anion gap: 7 (ref 5–15)
BUN: 12 mg/dL (ref 8–23)
CO2: 30 mmol/L (ref 22–32)
Calcium: 9.5 mg/dL (ref 8.9–10.3)
Chloride: 104 mmol/L (ref 98–111)
Creatinine, Ser: 0.77 mg/dL (ref 0.61–1.24)
GFR, Estimated: >60 mL/min (ref >60)
Glucose, Bld: 114 mg/dL — ABNORMAL HIGH (ref 70–99)
Potassium: 3.6 mmol/L (ref 3.5–5.1)
Sodium: 141 mmol/L (ref 135–145)
Total Bilirubin: 0.3 mg/dL (ref 0.3–1.2)
Total Protein: 7 g/dL (ref 6.5–8.1)

## 2021-07-02 LAB — RESP PANEL BY RT-PCR (FLU A&B, COVID) ARPGX2
Influenza A by PCR: NEGATIVE
Influenza B by PCR: NEGATIVE
SARS Coronavirus 2 by RT PCR: NEGATIVE

## 2021-07-02 LAB — TSH: TSH: 1.853 u[IU]/mL (ref 0.350–4.500)

## 2021-07-02 LAB — GLUCOSE, CAPILLARY: Glucose-Capillary: 166 mg/dL — ABNORMAL HIGH (ref 70–99)

## 2021-07-02 IMAGING — MR MR HEAD WO/W CM
13 series · 48 of 48 positions shown · IV contrast (gadavist)
Comparison: MRI head with contrast [DATE]

CLINICAL DATA: Metastatic lung cancer.  Assess treatment response.

EXAM:
MRI HEAD WITHOUT AND WITH CONTRAST
TECHNIQUE: Multiplanar, multiecho pulse sequences of the brain and surrounding
structures were obtained without and with intravenous contrast.
CONTRAST:  7.5mL GADAVIST GADOBUTROL 1 MMOL/ML IV SOLN

[Series 5: DWI · axial · 3.0mm · 1.36mm/px · z∈[-0,+149]mm · 7 of 104 slices shown (1 of 2)]
[im 1/104]
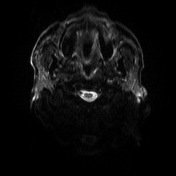
[im 18/104]
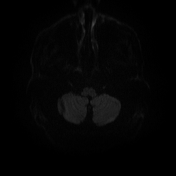
[im 35/104]
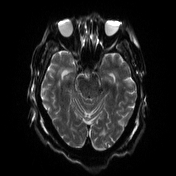
[im 52/104]
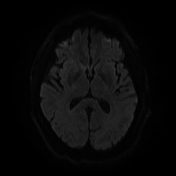
[im 69/104]
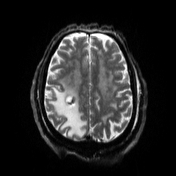
[im 86/104]
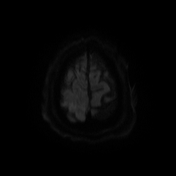
[im 104/104]
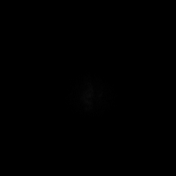

[Series 6: DWI · axial · 3.0mm · 1.36mm/px · z∈[-0,+149]mm · 3 of 52 slices shown (2 of 2)]
[im 1/52]
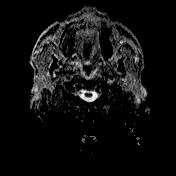
[im 26/52]
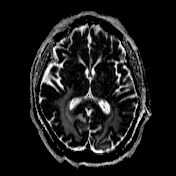
[im 52/52]
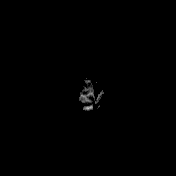

[Series 7: T1 · sagittal · 5.0mm · 0.75mm/px · 1 of 24 slices shown (1 of 2)]
[im 1/24]
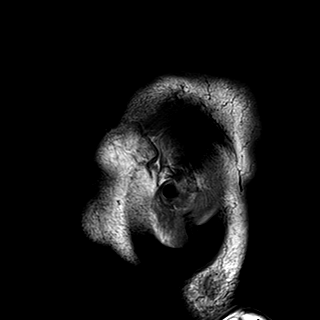

[Series 8: T2 · axial · 5.0mm · 0.62mm/px · 1 of 26 slices shown]
[im 1/26]
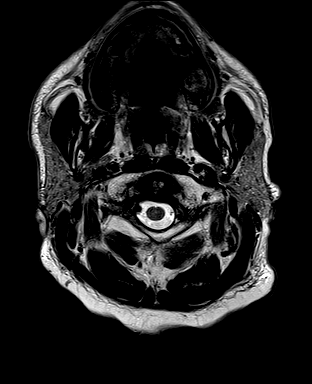

[Series 9: swi_images · axial · 3.0mm · 0.75mm/px · z∈[-7,+154]mm · 3 of 56 slices shown]
[im 1/56]
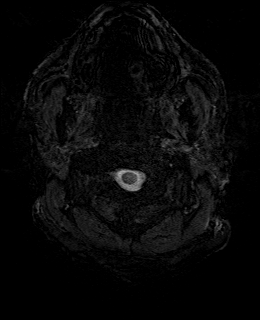
[im 28/56]
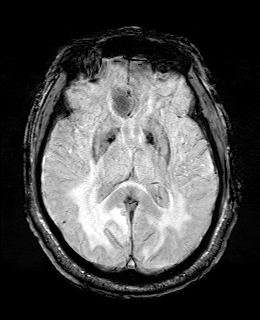
[im 56/56]
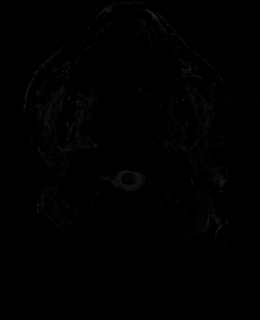

[Series 11: FLAIR · axial · 3.0mm · 0.75mm/px · z∈[-1,+148]mm · 3 of 52 slices shown]
[im 1/52]
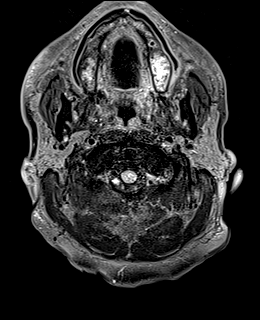
[im 26/52]
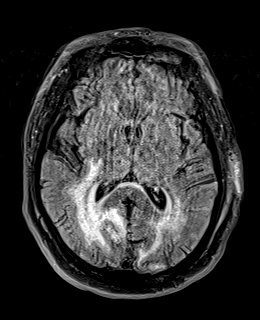
[im 52/52]
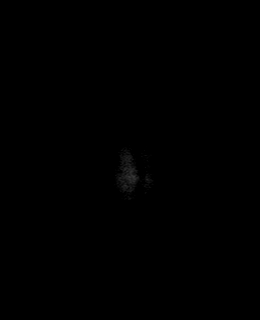

[Series 12: T1 · axial · 1.0mm · 0.94mm/px · z∈[-12,+158]mm · 10 of 176 slices shown (2 of 2)]
[im 1/176]
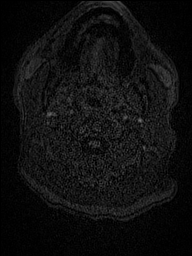
[im 20/176]
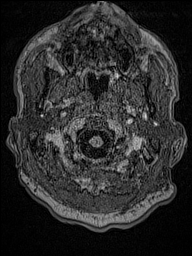
[im 39/176]
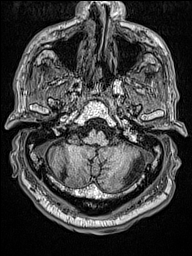
[im 59/176]
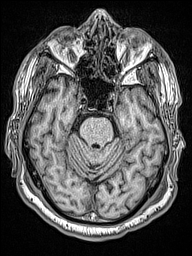
[im 78/176]
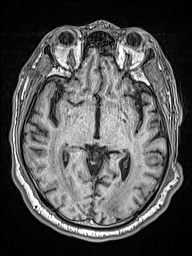
[im 98/176]
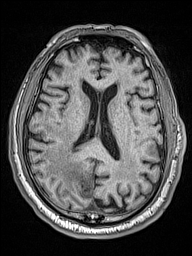
[im 117/176]
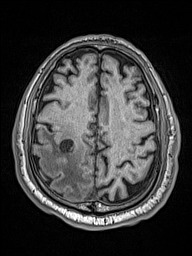
[im 137/176]
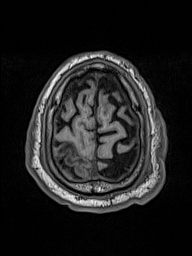
[im 156/176]
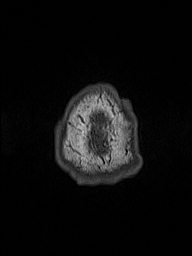
[im 176/176]
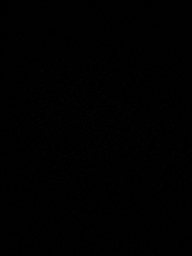

[Series 13: cor dwi_tracew · coronal · 5.0mm · 1.53mm/px · 3 of 60 slices shown]
[im 1/60]
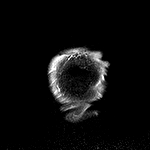
[im 30/60]
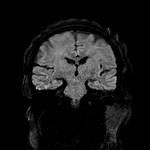
[im 60/60]
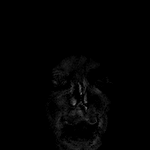

[Series 14: cor dwi_adc · coronal · 5.0mm · 1.53mm/px · 2 of 30 slices shown]
[im 1/30]
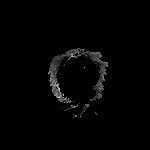
[im 30/30]
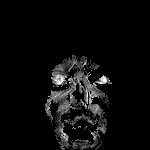

[Series 15: T2 post-contrast · coronal · 5.0mm · 0.57mm/px · 2 of 32 slices shown]
[im 1/32]
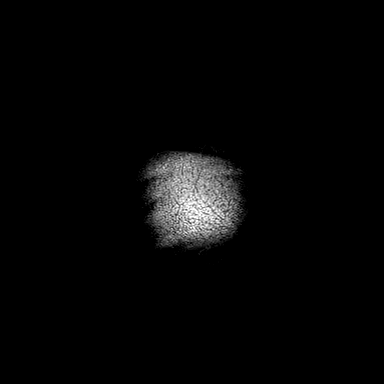
[im 32/32]
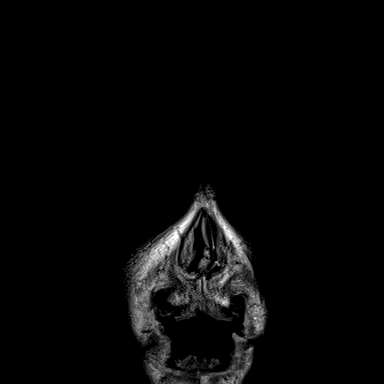

[Series 16: T1 post-contrast · axial · 1.0mm · 0.94mm/px · z∈[-12,+158]mm · 10 of 176 slices shown (1 of 3)]
[im 1/176]
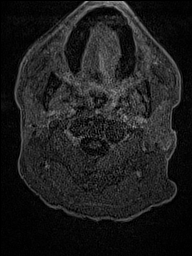
[im 20/176]
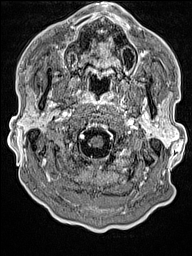
[im 39/176]
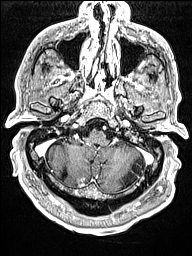
[im 59/176]
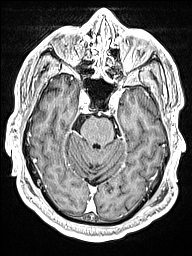
[im 78/176]
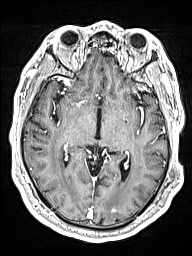
[im 98/176]
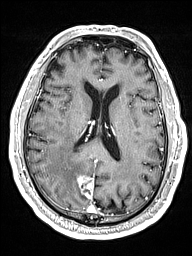
[im 117/176]
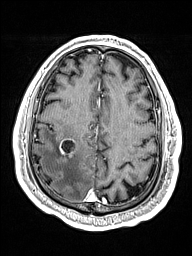
[im 137/176]
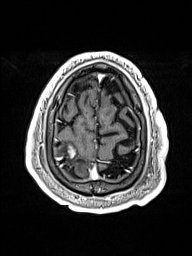
[im 156/176]
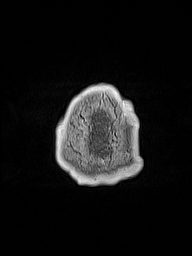
[im 176/176]
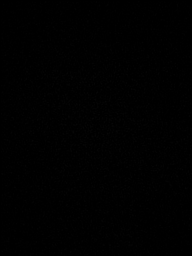

[Series 17: T1 post-contrast · coronal · 5.0mm · 0.43mm/px · 2 of 32 slices shown (2 of 3)]
[im 1/32]
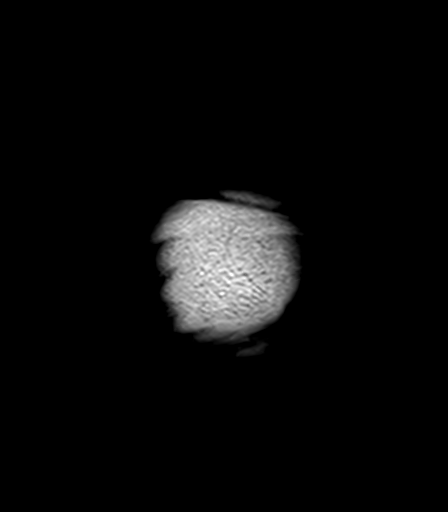
[im 32/32]
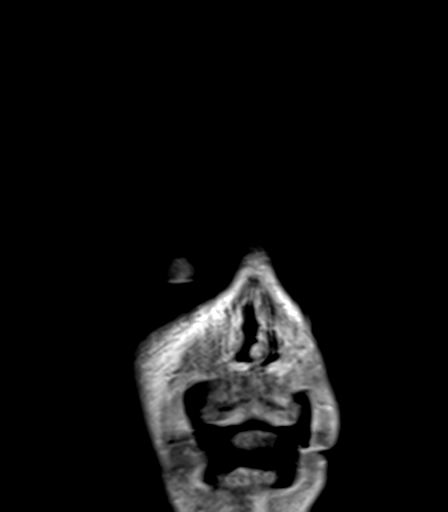

[Series 18: T1 post-contrast · sagittal · 5.0mm · 0.75mm/px · 1 of 24 slices shown (3 of 3)]
[im 1/24]
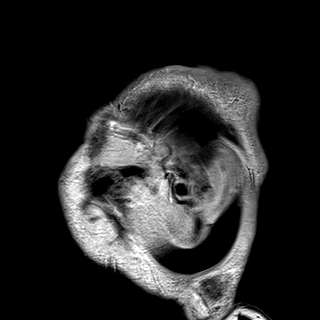

[48 of 48 positions shown; findings below may reference images not displayed]

FINDINGS: Brain: 4 enhancing metastatic deposit in the brain, all lesions show
progression.

Right posterior cerebellar lesion now measures 13.5 mm with
progression. Associated hemorrhage shows mild progression. Mild
edema surrounding the lesion with progression.

Left occipital lesion shows progression now measuring 11.5 mm in
diameter. Cystic necrosis. Progression of associated hemorrhage in
the lesion. Progression of surrounding edema.

Right medial parietal lobe lesion 22 mm in diameter with
progression. Mild associated hemorrhage unchanged. Progression of
surrounding vasogenic edema.

Rim enhancing cystic lesion in the right parietal lobe measures
mm in diameter with progression. Progressive hemorrhage. Moderate
vasogenic edema has progressed.

No new lesions.

Ventricle size normal.  No acute infarct.  No midline shift.

Vascular: Normal arterial flow voids

Skull and upper cervical spine: No metastatic disease to the
calvarium.

Sinuses/Orbits: Mild mucosal edema paranasal sinuses. Negative orbit

Other: None
IMPRESSION: 4 metastatic deposits in the brain show progression since MRI
[DATE]. All lesions show interval growth. Increased surrounding
edema specially in the right parietal lobe. All lesions show
evidence of prior hemorrhage with progression of hemorrhage in the
right cerebellar lesion, left occipital lesion, and right parietal
lesion.

## 2021-07-02 IMAGING — CT CT HEAD W/O CM
4 series · 15 of 47 positions shown, 17 images · non-contrast
Comparison: [DATE] CT head, MR brain [DATE]

CLINICAL DATA: High sudden onset of generalized weakness especially
lower extremities, new onset tremors, fell trying to get out of bed,
question stroke; history non-small cell lung cancer with radiation
therapy, hypertension, diabetes mellitus



[Series 2: head wo · axial · 0.45mm/px · z∈[-151,-36]mm · 7 of 31 slices shown, 9 images]
[im 4/31  brain]
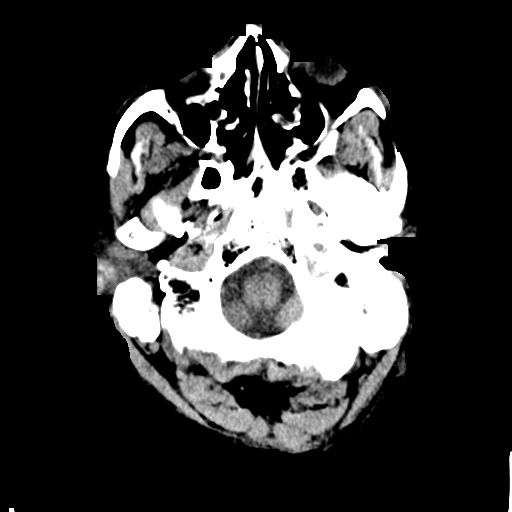
[im 4/31  bone]
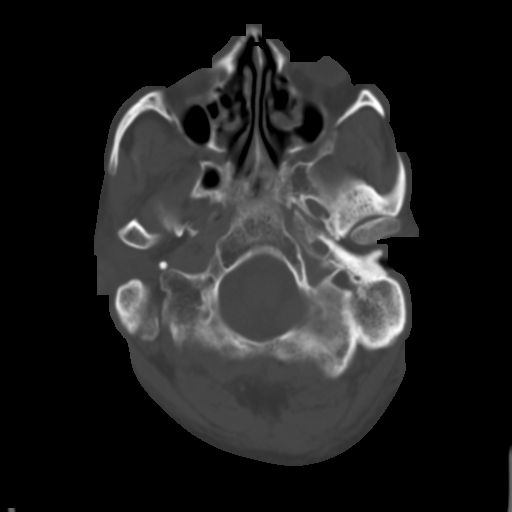
[im 8/31  brain]
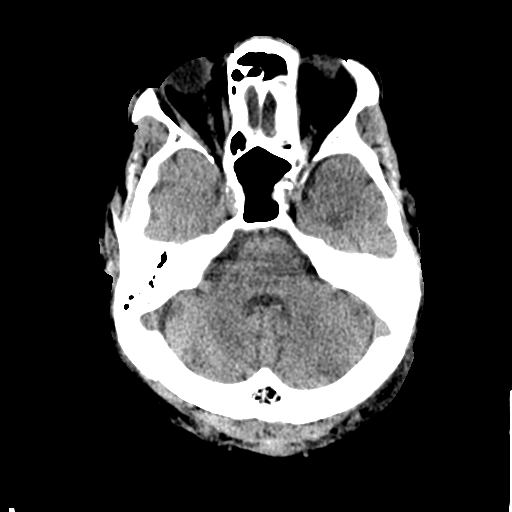
[im 12/31  brain]
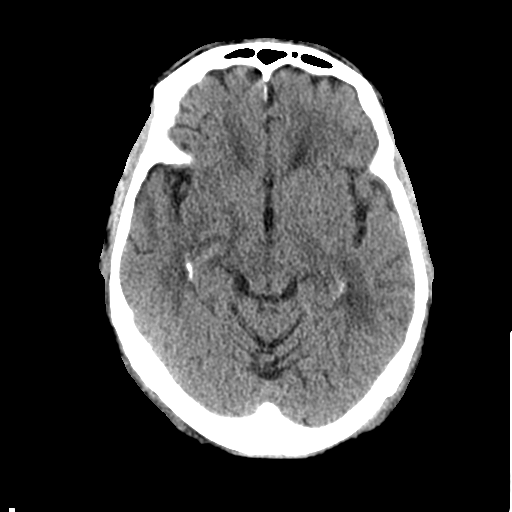
[im 16/31  brain]
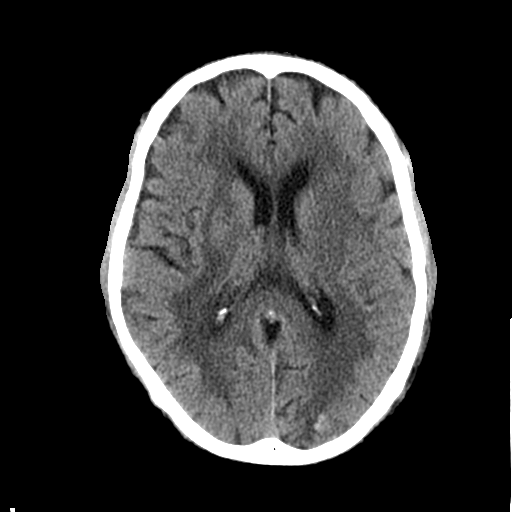
[im 19/31  brain]
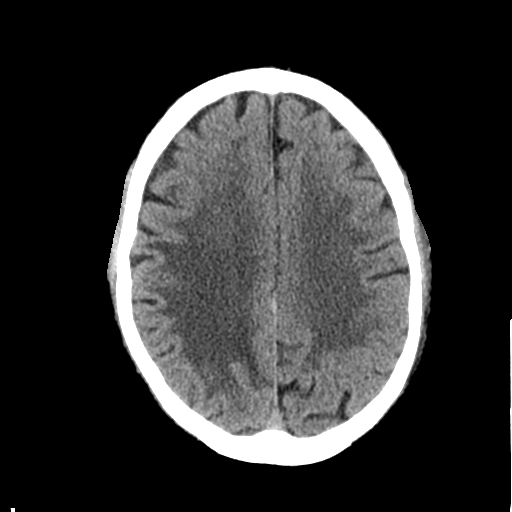
[im 19/31  bone]
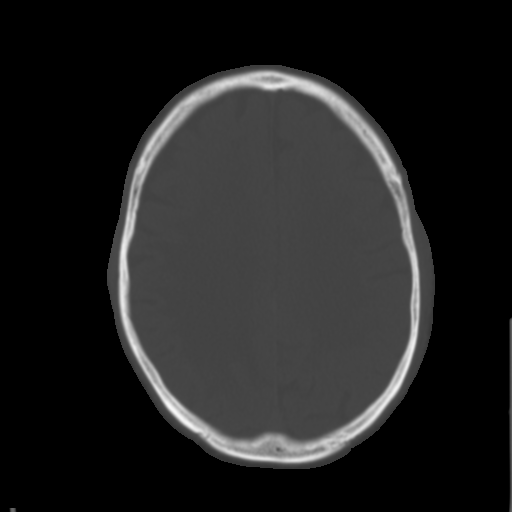
[im 23/31  brain]
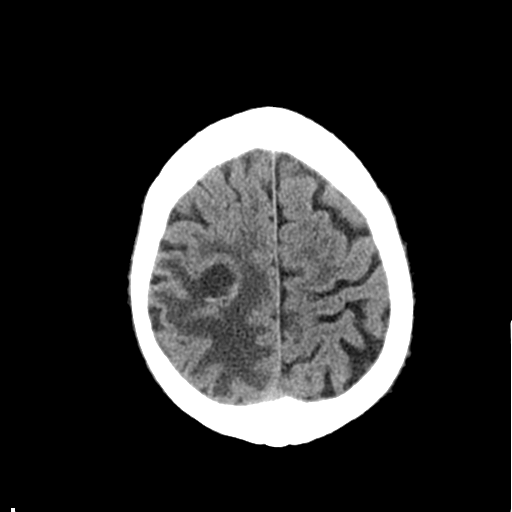
[im 27/31  brain]
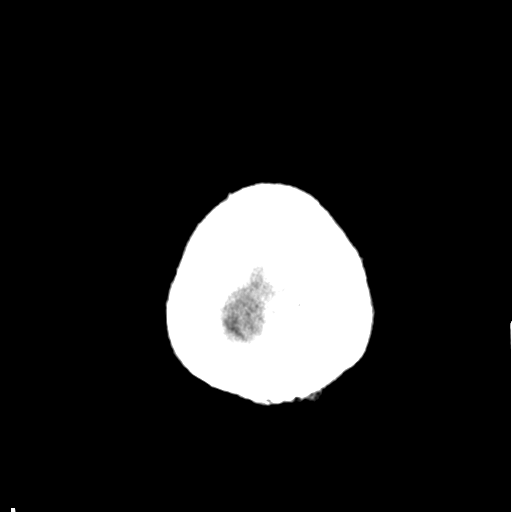

[Series 3: head bone · axial · 0.45mm/px · z∈[-152,-136]mm · 2 of 77 slices shown]
[im 8/77  bone]
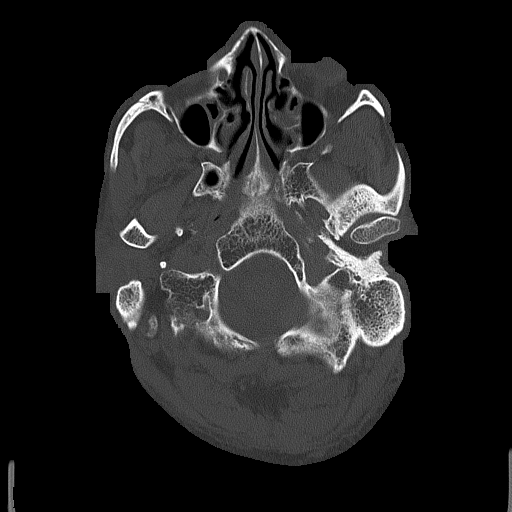
[im 16/77  bone]
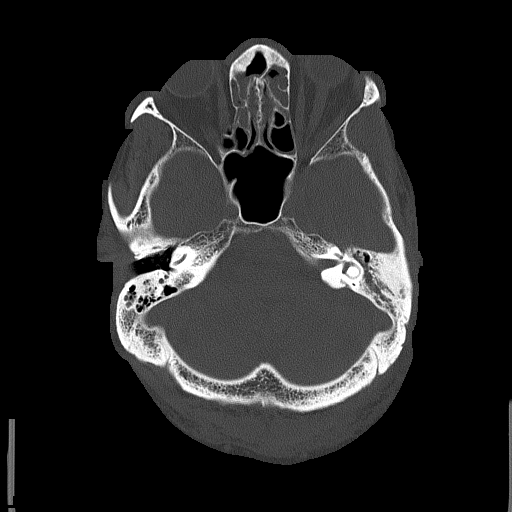

[Series 5: coronal soft tissue · coronal · 0.30mm/px · 3 of 68 slices shown]
[im 23/68  brain]
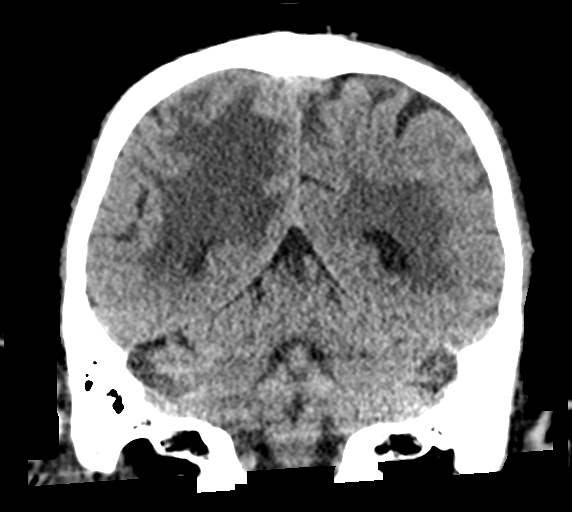
[im 30/68  brain]
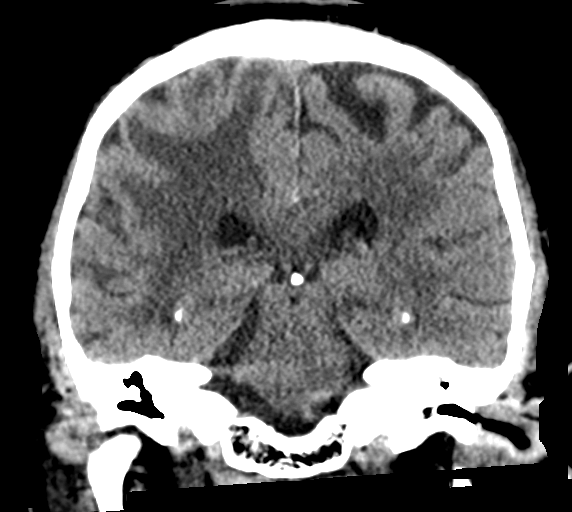
[im 38/68  brain]
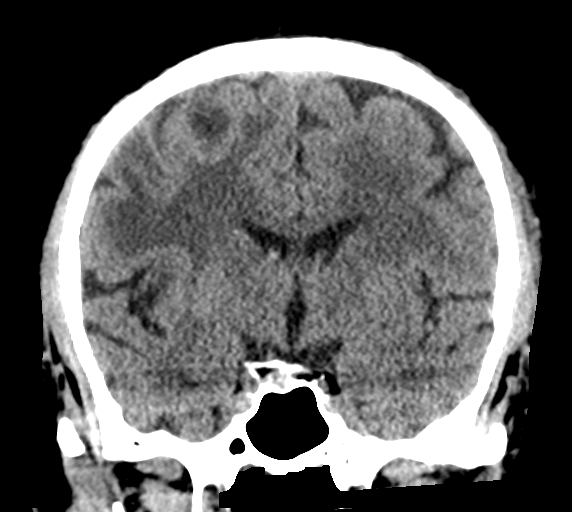

[Series 6: sagittal soft tissue · sagittal · 0.30mm/px · 3 of 57 slices shown]
[im 19/57  brain]
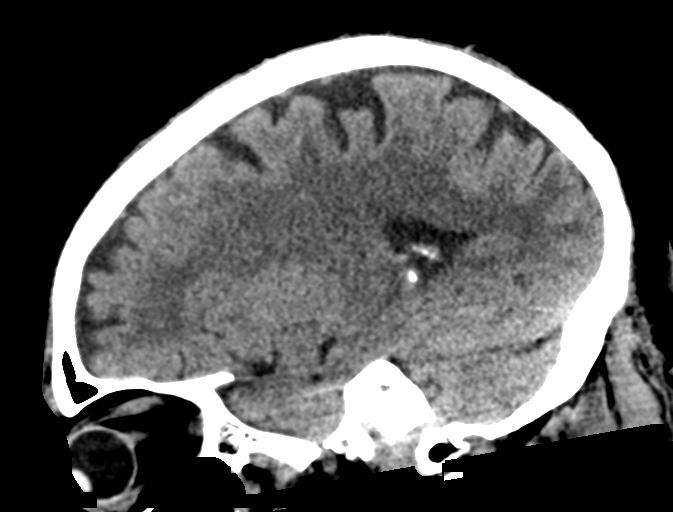
[im 29/57  brain]
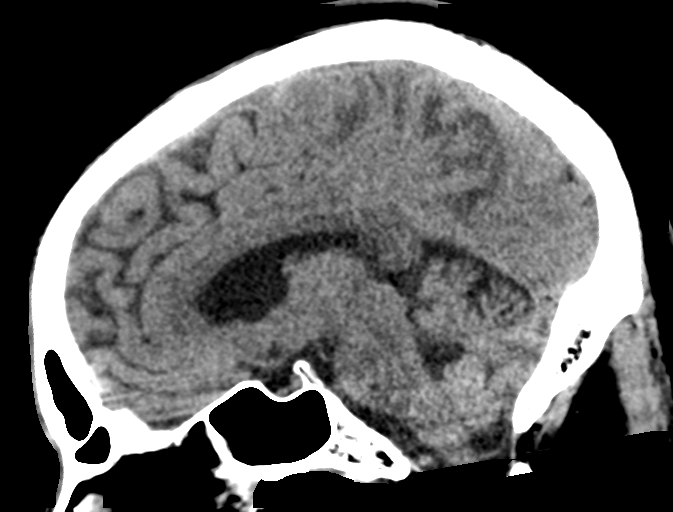
[im 38/57  brain]
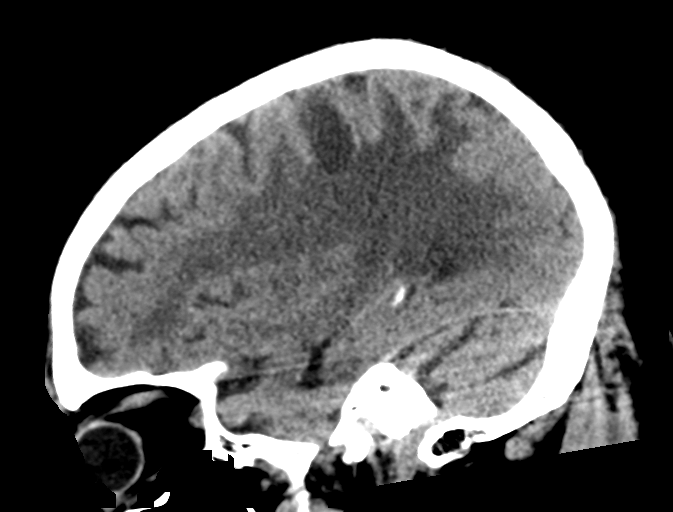

[15 of 47 positions shown; findings below may reference images not displayed]

FINDINGS: Brain: Generalized atrophy. Normal ventricular morphology. No
midline shift or mass effect. Small vessel chronic ischemic changes
of deep cerebral white matter. Areas of vasogenic edema again
identified within the RIGHT hemisphere. Area of higher attenuation
at the high RIGHT parietal region question small hemorrhagic
metastasis, seen previously. Additional small high attenuation focus
LEFT occipital consistent with additional hemorrhagic metastasis.
Both of these lesions were seen on prior MR. BORRADO basal ganglia
calcifications bilaterally. Additional small metastatic lesions seen
previously at the inferior margin of the RIGHT occipital lobe on
prior MR is not seen on current study.

Vascular: Minimal atherosclerotic calcification of internal carotid
arteries at skull base

Skull: Intact

Sinuses/Orbits: Opacified LEFT mastoid air cells. Mucosal thickening
ethmoid air cells and sphenoid sinus.

Other: N/A
IMPRESSION: Atrophy with small vessel chronic ischemic changes of deep cerebral
white matter.

Areas of vasogenic edema again identified within the RIGHT
hemisphere.

Small hemorrhagic metastases at the high RIGHT parietal lobe and
LEFT occipital lobe.

No additional areas of hemorrhage or infarction.

## 2021-07-02 MED ORDER — GLIPIZIDE 5 MG PO TABS
5.0000 mg | ORAL_TABLET | Freq: Every day | ORAL | Status: DC
Start: 2021-07-03 — End: 2021-07-03
  Filled 2021-07-02: qty 1

## 2021-07-02 MED ORDER — ATORVASTATIN CALCIUM 20 MG PO TABS: 20.0000 mg | ORAL_TABLET | Freq: Every day | ORAL | Status: DC

## 2021-07-02 MED ORDER — DEXAMETHASONE SODIUM PHOSPHATE 10 MG/ML IJ SOLN
10.0000 mg | Freq: Once | INTRAMUSCULAR | Status: AC
  Administered 2021-07-02: 10 mg via INTRAVENOUS
  Filled 2021-07-02: qty 1

## 2021-07-02 MED ORDER — GADOBUTROL 1 MMOL/ML IV SOLN
7.5000 mL | Freq: Once | INTRAVENOUS | Status: AC | PRN
  Administered 2021-07-02: 7.5 mL via INTRAVENOUS

## 2021-07-02 MED ORDER — PANTOPRAZOLE SODIUM 40 MG PO TBEC
40.0000 mg | DELAYED_RELEASE_TABLET | Freq: Every day | ORAL | Status: DC
Start: 2021-07-03 — End: 2021-07-15
  Administered 2021-07-03 – 2021-07-15 (×13): 40 mg via ORAL
  Filled 2021-07-02 (×13): qty 1

## 2021-07-02 MED ORDER — ACETAMINOPHEN 325 MG PO TABS
650.0000 mg | ORAL_TABLET | Freq: Four times a day (QID) | ORAL | Status: DC | PRN
  Administered 2021-07-04 – 2021-07-13 (×3): 650 mg via ORAL
  Filled 2021-07-02 (×3): qty 2

## 2021-07-02 MED ORDER — LEVETIRACETAM IN NACL 1000 MG/100ML IV SOLN
1000.0000 mg | Freq: Two times a day (BID) | INTRAVENOUS | Status: DC
  Administered 2021-07-02 – 2021-07-04 (×4): 1000 mg via INTRAVENOUS
  Filled 2021-07-02 (×5): qty 100

## 2021-07-02 MED ORDER — ONDANSETRON HCL 4 MG/2ML IJ SOLN: 4.0000 mg | Freq: Four times a day (QID) | INTRAMUSCULAR | Status: DC | PRN

## 2021-07-02 MED ORDER — ONDANSETRON HCL 4 MG PO TABS: 4.0000 mg | ORAL_TABLET | Freq: Four times a day (QID) | ORAL | Status: DC | PRN

## 2021-07-02 MED ORDER — OXYCODONE HCL 5 MG PO TABS
5.0000 mg | ORAL_TABLET | ORAL | Status: DC | PRN
  Administered 2021-07-05 – 2021-07-11 (×2): 5 mg via ORAL
  Filled 2021-07-02 (×2): qty 1

## 2021-07-02 MED ORDER — DEXAMETHASONE SODIUM PHOSPHATE 4 MG/ML IJ SOLN
4.0000 mg | Freq: Four times a day (QID) | INTRAMUSCULAR | Status: DC
  Administered 2021-07-03 – 2021-07-04 (×7): 4 mg via INTRAVENOUS
  Filled 2021-07-02 (×7): qty 1

## 2021-07-02 MED ORDER — SODIUM CHLORIDE 0.9 % IV SOLN: INTRAVENOUS | Status: AC

## 2021-07-02 MED ORDER — ACETAMINOPHEN 650 MG RE SUPP: 650.0000 mg | Freq: Four times a day (QID) | RECTAL | Status: DC | PRN

## 2021-07-02 MED ORDER — INSULIN ASPART 100 UNIT/ML IJ SOLN
0.0000 [IU] | Freq: Three times a day (TID) | INTRAMUSCULAR | Status: DC
  Administered 2021-07-02: 3 [IU] via SUBCUTANEOUS
  Administered 2021-07-03: 2 [IU] via SUBCUTANEOUS
  Administered 2021-07-03 – 2021-07-04 (×4): 5 [IU] via SUBCUTANEOUS
  Administered 2021-07-05 (×2): 3 [IU] via SUBCUTANEOUS
  Filled 2021-07-02: qty 0.15

## 2021-07-02 NOTE — Assessment & Plan Note (Addendum)
Patient with metastatic brain cancer as above.  Poor long-term prognosis.  -Palliative care signed off.  Recommended palliative follow-up at [facility]

## 2021-07-02 NOTE — Assessment & Plan Note (Addendum)
BP controlled. Tolerating norvasc.

## 2021-07-02 NOTE — Assessment & Plan Note (Deleted)
See above

## 2021-07-02 NOTE — Assessment & Plan Note (Deleted)
See #1 °

## 2021-07-02 NOTE — Hospital Course (Addendum)
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 Lorenso Courier presenting with generalized weakness, BLE weakness, tremors and fall when he tried to get out of the bed,  CT head and MRI brain with for metastatic deposits in brain (progressed since his MRI on 05/13/2021) increased surrounding edema in the right parietal lobe with evidence of prior hemorrhage with progression of hemorrhage into right cerebellar lesion, left occipital lesion and right parietal lesion.  Started on steroid.   Oncology, neurooncology and radiation oncology involved.  Patient was discussed on tumor board meeting.  Given improvement with steroid, the recommendation is to continue Decadron 4 mg daily and arrange outpatient repeat MRI.   Therapy recommended SNF.  However, patient developed shingles across right lower chest and abdomen.  Started on Valtrex and airborne precaution on 3/5.  Medically stable for discharge if SNF can take him with airborne precaution or the lesions crust and he comes off airborne precaution.

## 2021-07-02 NOTE — ED Provider Notes (Signed)
[facility] DEPT Provider Note   CSN: [id number] Arrival date & time: 07/02/21  1035     History  Chief Complaint  Patient presents with   Weakness    Benjamin Willis. is a 76 y.o. male.   Weakness Patient presents for weakness.  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  He is currently receiving chemotherapy.  He has undergone whole brain radiation in the past.  He currently takes 2 mg of Decadron daily.  He was seen in oncology clinic 4 days ago.  At that time, he was feeling well.  The patient is typically able to walk unassisted.  Yesterday he had increased difficulty with walking.  Today, he has not been able to walk.  He fell off of the bed onto the floor and was not able to get up, even with assistance at home.  EMS was called and patient was brought to the ED.  Patient currently denies any changes to his speech or vision.  He denies any areas of numbness.  He does endorse generalized weakness that is worse on the left side.    Home Medications Prior to Admission medications   Medication Sig Start Date End Date Taking? Authorizing Provider  albuterol (VENTOLIN HFA) 108 (90 Base) MCG/ACT inhaler Inhale 2 puffs into the lungs every 6 (six) hours as needed for wheezing or shortness of breath. 01/19/19  Yes Icard, Bradley L, DO  amLODipine (NORVASC) 10 MG tablet Take 10 mg by mouth daily.   Yes [provider]  ammonium lactate (AMLACTIN) 12 % lotion Apply 1 application topically as needed for dry skin. 01/24/21  Yes Ailene Ards, NP  dexamethasone (DECADRON) 2 MG tablet Take 1 tablet (2 mg total) by mouth daily. 04/09/21  Yes Vaslow, Acey Lav, MD  folic acid (FOLVITE) 1 MG tablet Take 1 tablet (1 mg total) by mouth daily. 05/01/21  Yes Sagardia, Ines Bloomer, MD  gabapentin (NEURONTIN) 300 MG capsule Take 1 capsule by mouth at bedtime Patient taking differently: Take 300 mg by mouth at bedtime. 06/23/21  Yes Sagardia,  Ines Bloomer, MD  glipiZIDE (GLUCOTROL) 5 MG tablet TAKE 1 TABLET BY MOUTH TWICE DAILY WITH A MEAL Patient taking differently: Take 5 mg by mouth 2 (two) times daily before a meal. 04/23/21  Yes Sagardia, Ines Bloomer, MD  latanoprost (XALATAN) 0.005 % ophthalmic solution Place 1 drop into both eyes at bedtime. 01/24/21  Yes Ailene Ards, NP  levETIRAcetam (KEPPRA) 1000 MG tablet Take 1 tablet by mouth twice daily Patient taking differently: Take 1,000 mg by mouth in the morning and at bedtime. 05/29/21  Yes Sagardia, Ines Bloomer, MD  lidocaine-prilocaine (EMLA) cream Apply 1 application topically as needed. Patient taking differently: Apply 1 application topically as needed (for port before treatment). 11/30/20  Yes Orson Slick, MD  metFORMIN (GLUCOPHAGE) 500 MG tablet TAKE 1 TABLET BY MOUTH TWICE DAILY WITH MEALS Patient taking differently: Take 500 mg by mouth daily with breakfast. 06/25/21  Yes El Brazil, Ines Bloomer, MD  multivitamin (ONE-A-DAY MEN'S) TABS tablet Take 1 tablet by mouth daily with breakfast.   Yes [provider]  ondansetron (ZOFRAN) 8 MG tablet Take 1 tablet (8 mg total) by mouth every 8 (eight) hours as needed for nausea or vomiting. 11/30/20  Yes Orson Slick, MD  pantoprazole (PROTONIX) 40 MG tablet Take 1 tablet (40 mg total) by mouth daily. Patient taking differently: Take 40 mg by mouth  daily before breakfast. 05/01/21  Yes Sagardia, Ines Bloomer, MD  prochlorperazine (COMPAZINE) 10 MG tablet Take 1 tablet (10 mg total) by mouth every 6 (six) hours as needed for nausea or vomiting. 11/30/20  Yes Orson Slick, MD  rosuvastatin (CRESTOR) 20 MG tablet Take 20 mg by mouth daily.   Yes [provider]  sitaGLIPtin (JANUVIA) 100 MG tablet Take 1 tablet (100 mg total) by mouth daily. 05/01/21 07/30/21 Yes Sagardia, Ines Bloomer, MD  atorvastatin (LIPITOR) 20 MG tablet Take 20 mg by mouth daily. Patient not taking: Reported on 07/02/2021 04/03/21    [provider]  blood glucose meter kit and supplies KIT Dispense based on patient and insurance preference. Use up to four times daily as directed. 10/23/20   Elodia Florence., MD  Blood Pressure Monitoring (BLOOD PRESSURE MONITOR/ARM) DEVI Check BP once a day. DX I 10 04/18/19   Horald Pollen, MD  erythromycin ophthalmic ointment Place 1 application into the left eye at bedtime. Patient not taking: Reported on 07/02/2021 05/01/21   Horald Pollen, MD      Allergies    Patient has no known allergies.    Review of Systems   Review of Systems  Neurological:  Positive for weakness.  All other systems reviewed and are negative.  Physical Exam Updated Vital Signs BP 133/85 (BP Location: Left Arm)    Pulse 88    Temp 98.2 F (36.8 C) (Oral)    Resp 16    Ht $R'5\' 6"'sh$  (1.676 m)    Wt 71.5 kg    SpO2 98%    BMI 25.44 kg/m  Physical Exam Vitals and nursing note reviewed.  Constitutional:      General: He is not in acute distress.    Appearance: He is well-developed. He is not toxic-appearing or diaphoretic.  HENT:     Head: Normocephalic and atraumatic.     Right Ear: External ear normal.     Left Ear: External ear normal.     Nose: Nose normal.     Mouth/Throat:     Mouth: Mucous membranes are moist.     Pharynx: Oropharynx is clear.  Eyes:     Conjunctiva/sclera: Conjunctivae normal.  Cardiovascular:     Rate and Rhythm: Normal rate and regular rhythm.     Heart sounds: No murmur heard. Pulmonary:     Effort: Pulmonary effort is normal. No respiratory distress.     Breath sounds: Normal breath sounds. No wheezing or rales.  Chest:     Chest wall: No tenderness.  Abdominal:     Palpations: Abdomen is soft.     Tenderness: There is no abdominal tenderness.  Musculoskeletal:        General: No swelling.     Cervical back: Normal range of motion and neck supple.     Right lower leg: No edema.     Left lower leg: No edema.  Skin:    General: Skin is  warm and dry.     Capillary Refill: Capillary refill takes less than 2 seconds.     Coloration: Skin is not jaundiced or pale.  Neurological:     Mental Status: He is alert.     Comments: Mildly slurred speech which patient and his sister state is baseline for him; denies sensory deficit; does have decreased strength with left lower extremity and left grip.  Psychiatric:        Mood and Affect: Mood normal.  Behavior: Behavior normal.        Thought Content: Thought content normal.        Judgment: Judgment normal.    ED Results / Procedures / Treatments   Labs (all labs ordered are listed, but only abnormal results are displayed) Labs Reviewed  CBC WITH DIFFERENTIAL/PLATELET - Abnormal; Notable for the following components:      Result Value   WBC 3.3 (*)    RBC 3.93 (*)    Hemoglobin 12.6 (*)    HCT 38.8 (*)    nRBC 0.6 (*)    Lymphs Abs 0.2 (*)    All other components within normal limits  COMPREHENSIVE METABOLIC PANEL - Abnormal; Notable for the following components:   Glucose, Bld 114 (*)    All other components within normal limits  URINALYSIS, ROUTINE W REFLEX MICROSCOPIC - Abnormal; Notable for the following components:   Protein, ur 30 (*)    All other components within normal limits  GLUCOSE, CAPILLARY - Abnormal; Notable for the following components:   Glucose-Capillary 166 (*)    All other components within normal limits  GLUCOSE, CAPILLARY - Abnormal; Notable for the following components:   Glucose-Capillary 123 (*)    All other components within normal limits  GLUCOSE, CAPILLARY - Abnormal; Notable for the following components:   Glucose-Capillary 161 (*)    All other components within normal limits  GLUCOSE, CAPILLARY - Abnormal; Notable for the following components:   Glucose-Capillary 143 (*)    All other components within normal limits  CBG MONITORING, ED - Abnormal; Notable for the following components:   Glucose-Capillary 105 (*)    All other  components within normal limits  RESP PANEL BY RT-PCR (FLU A&B, COVID) ARPGX2  TSH    EKG EKG Interpretation  Date/Time:  Tuesday July 02 2021 10:52:40 EST Ventricular Rate:  89 PR Interval:  135 QRS Duration: 80 QT Interval:  344 QTC Calculation: 419 R Axis:   -21 Text Interpretation: Sinus rhythm Borderline left axis deviation Anterior infarct, old Confirmed by Godfrey Pick [phone]) on 07/02/2021 11:54:29 AM  Radiology CT Head Wo Contrast  Result Date: 07/02/2021 CLINICAL DATA:  High sudden onset of generalized weakness especially lower extremities, new onset tremors, fell trying to get out of bed, question stroke; history non-small cell lung cancer with radiation therapy, hypertension, diabetes mellitus EXAM: CT HEAD WITHOUT CONTRAST TECHNIQUE: Contiguous axial images were obtained from the base of the skull through the vertex without intravenous contrast. RADIATION DOSE REDUCTION: This exam was performed according to the departmental dose-optimization program which includes automated exposure control, adjustment of the mA and/or kV according to patient size and/or use of iterative reconstruction technique. COMPARISON:  10/15/2020 CT head, MR brain 05/13/2021 FINDINGS: Brain: Generalized atrophy. Normal ventricular morphology. No midline shift or mass effect. Small vessel chronic ischemic changes of deep cerebral white matter. Areas of vasogenic edema again identified within the RIGHT hemisphere. Area of higher attenuation at the high RIGHT parietal region question small hemorrhagic metastasis, seen previously. Additional small high attenuation focus LEFT occipital consistent with additional hemorrhagic metastasis. Both of these lesions were seen on prior MR. Small basal ganglia calcifications bilaterally. Additional small metastatic lesions seen previously at the inferior margin of the RIGHT occipital lobe on prior MR is not seen on current study. Vascular: Minimal atherosclerotic  calcification of internal carotid arteries at skull base Skull: Intact Sinuses/Orbits: Opacified LEFT mastoid air cells. Mucosal thickening ethmoid air cells and sphenoid sinus. Other: N/A IMPRESSION: Atrophy with  small vessel chronic ischemic changes of deep cerebral white matter. Areas of vasogenic edema again identified within the RIGHT hemisphere. Small hemorrhagic metastases at the high RIGHT parietal lobe and LEFT occipital lobe. No additional areas of hemorrhage or infarction. Electronically Signed   By: Lavonia Dana M.D.   On: 07/02/2021 12:39   MR Brain W and Wo Contrast  Result Date: 07/02/2021 CLINICAL DATA:  Metastatic lung cancer.  Assess treatment response. EXAM: MRI HEAD WITHOUT AND WITH CONTRAST TECHNIQUE: Multiplanar, multiecho pulse sequences of the brain and surrounding structures were obtained without and with intravenous contrast. CONTRAST:  7.23mL GADAVIST GADOBUTROL 1 MMOL/ML IV SOLN COMPARISON:  MRI head with contrast 05/13/2021 FINDINGS: Brain: 4 enhancing metastatic deposit in the brain, all lesions show progression. Right posterior cerebellar lesion now measures 13.5 mm with progression. Associated hemorrhage shows mild progression. Mild edema surrounding the lesion with progression. Left occipital lesion shows progression now measuring 11.5 mm in diameter. Cystic necrosis. Progression of associated hemorrhage in the lesion. Progression of surrounding edema. Right medial parietal lobe lesion 22 mm in diameter with progression. Mild associated hemorrhage unchanged. Progression of surrounding vasogenic edema. Rim enhancing cystic lesion in the right parietal lobe measures 20.5 mm in diameter with progression. Progressive hemorrhage. Moderate vasogenic edema has progressed. No new lesions. Ventricle size normal.  No acute infarct.  No midline shift. Vascular: Normal arterial flow voids Skull and upper cervical spine: No metastatic disease to the calvarium. Sinuses/Orbits: Mild mucosal edema  paranasal sinuses. Negative orbit Other: None IMPRESSION: 4 metastatic deposits in the brain show progression since MRI 05/13/2021. All lesions show interval growth. Increased surrounding edema specially in the right parietal lobe. All lesions show evidence of prior hemorrhage with progression of hemorrhage in the right cerebellar lesion, left occipital lesion, and right parietal lesion. Electronically Signed   By: Franchot Gallo M.D.   On: 07/02/2021 15:26    Procedures Procedures    Medications Ordered in ED Medications  insulin aspart (novoLOG) injection 0-15 Units (3 Units Subcutaneous Given 07/02/21 1734)  0.9 %  sodium chloride infusion ( Intravenous Infusion Verify 07/03/21 0549)  acetaminophen (TYLENOL) tablet 650 mg (has no administration in time range)    Or  acetaminophen (TYLENOL) suppository 650 mg (has no administration in time range)  oxyCODONE (Oxy IR/ROXICODONE) immediate release tablet 5 mg (has no administration in time range)  ondansetron (ZOFRAN) tablet 4 mg (has no administration in time range)    Or  ondansetron (ZOFRAN) injection 4 mg (has no administration in time range)  levETIRAcetam (KEPPRA) IVPB 1000 mg/100 mL premix (1,000 mg Intravenous New Bag/Given 07/03/21 0454)  dexamethasone (DECADRON) injection 4 mg (4 mg Intravenous Given 07/03/21 0548)  glipiZIDE (GLUCOTROL) tablet 5 mg (has no administration in time range)  pantoprazole (PROTONIX) EC tablet 40 mg (has no administration in time range)  gadobutrol (GADAVIST) 1 MMOL/ML injection 7.5 mL (7.5 mLs Intravenous Contrast Given 07/02/21 1512)  dexamethasone (DECADRON) injection 10 mg (10 mg Intravenous Given 07/02/21 1711)    ED Course/ Medical Decision Making/ A&P                           Medical Decision Making Amount and/or Complexity of Data Reviewed Labs: ordered. Radiology: ordered.  Risk Prescription drug management. Decision regarding hospitalization.   This patient presents to the ED for concern of  weakness, this involves an extensive number of treatment options, and is a complaint that carries with it a high risk  of complications and morbidity.  The differential diagnosis includes worsening metastatic disease, CVA, polypharmacy, medication withdrawal, infection   Co morbidities that complicate the patient evaluation  DM2, HLD, HTN, lung cancer, brain metastases   Additional history obtained:  Additional history obtained from patient's sister External records from outside source obtained and reviewed including EMR   Lab Tests:  I Ordered, and personally interpreted labs.  The pertinent results include: Baseline anemia, leukopenia, normal electrolytes   Imaging Studies ordered:  I ordered imaging studies including CT head, MRI brain I independently visualized and interpreted imaging which showed interval growth of for metastatic deposits in brain I agree with the radiologist interpretation   Cardiac Monitoring:  The patient was maintained on a cardiac monitor.  I personally viewed and interpreted the cardiac monitored which showed an underlying rhythm of: Sinus rhythm  Problem List / ED Course:  Patient is a 76 year old male with metastatic lung cancer with known metastases to his brain, s/p whole brain radiation, chemotherapy, and daily Decadron, presenting for acute on chronic weakness.  Patient is, at baseline, able to ambulate.  He had difficulty with ambulation yesterday and is unable to ambulate today.  He did have a fall out of bed this morning but denies any areas of pain since that fall.  He has no areas of tenderness on exam.  I do not feel that he sustained any injuries from this fall.  On arrival in the ED, he is alert and oriented.  On exam, he does have notable left-sided hemibody weakness.  Initial work-up included lab work and CT of head.  CT of head did not show any clear evidence of changes from prior studies.  MRI was ordered which did show progression of  metastatic disease.  1 of these lesions is located in his right parietal lobe and I do feel that this finding is consistent with the left-sided weakness that he is experiencing.  Patient to be admitted for further management.  Care of patient was signed out to oncoming ED provider.   Reevaluation:  After the interventions noted above, I reevaluated the patient and found that they have :stayed the same   Social Determinants of Health:  Has family support at home and has access to outpatient care   Dispostion:  After consideration of the diagnostic results and the patients response to treatment, I feel that the patent would benefit from admission.          Final Clinical Impression(s) / ED Diagnoses Final diagnoses:  Weakness  Brain metastases [facility])    Rx / DC Orders ED Discharge Orders     None         Godfrey Pick, MD 07/03/21 [phone]

## 2021-07-02 NOTE — Assessment & Plan Note (Addendum)
Patient presents with fall, bilateral leg weakness, left> right.  MRI brain with increased brain mets, edema, and evidence of prior hemorrhage with progression in the right cerebellar lesion, left occipital lesion or right parietal lesions.  Started on Decadron.  Motor 4/5 in BLE on my exam. -Oncology, neurooncology and radiation oncology involved.   -Per neurooncology-Decadron 4 mg every 12 hours and outpatient follow-up for repeat MRI -Continue home Keppra -Appreciate input by palliative care

## 2021-07-02 NOTE — ED Triage Notes (Signed)
Pt BIBA from home for sudden onset generalized weakness esp lower extremities. Also new onset tremors. Usually ambulates with cane, unable to today. Sister found pt, had fallen while trying to get out of bed. Denies LOC, trauma, no thinners. Hx brain and lung cancer, A&Ox2 at baseline. Chemo last Friday.   Temp 100 HR 94 BP 146/84 CBG 149

## 2021-07-02 NOTE — ED Provider Notes (Signed)
°  Physical Exam  BP 130/77    Pulse 83    Temp 99.4 F (37.4 C) (Oral)    Resp 18    SpO2 97%     Procedures  Procedures  ED Course / MDM    Medical Decision Making Amount and/or Complexity of Data Reviewed Labs: ordered. Radiology: ordered.  Risk Prescription drug management. Decision regarding hospitalization.   76 year old male with worsening weakness, known lung cancer with mets to the brain. MRI results done. Plan for hospitalist admission.   Now cannot walk with worsening left sided deficits. MRI shows: IMPRESSION:  4 metastatic deposits in the brain show progression since MRI  05/13/2021. All lesions show interval growth. Increased surrounding  edema specially in the right parietal lobe. All lesions show  evidence of prior hemorrhage with progression of hemorrhage in the  right cerebellar lesion, left occipital lesion, and right parietal  lesion.    Discussed the patient with Dr. Antonieta Pert who accepted the patient in admission.  A consult to oncology was placed.     Regan Lemming, MD 07/02/21 [phone]

## 2021-07-02 NOTE — Assessment & Plan Note (Addendum)
Hb stable.

## 2021-07-02 NOTE — Assessment & Plan Note (Addendum)
Controlled with hyperglycemia.  A1c 6.7% on 05/01/2021. And Hypoglycemia.  -Continue SSI-moderate -Continue NovoLog 5 units 3 times daily -Continue holding home glipizide, metformin and Januvia -Plan to decreased Semglee to 16-- due to hypoglycemia. His Bmet glucose was 24. Received glucose.  -Repeated CBG at 3 am . Today normal at 101. I will decrease night dose of levemir.

## 2021-07-02 NOTE — Assessment & Plan Note (Addendum)
-   Continue home statin °

## 2021-07-02 NOTE — H&P (Signed)
History and Physical    Benjamin Willis. 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 Lorenso Courier recent CT abdomen pelvis chest 03/05/2021 no evidence of disease, seen by oncology on 2/24, had left leg weakness that improved with corticosteroid therapy and on dexamethasone 2 mg daily, followed by neurology and MRI was ordered scheduled for 07/12/2021, chemo induced anemia leukopenia presented to the ED with sudden onset of generalized weakness and lower extremities, tremors.  At baseline ambulates with cane but was unable to ambulate today, sister found patient who was following while trying to get out of bed no loss of consciousness trauma.  He had chemo last Friday-gets chemo every 20 days or so.  In the ED was alert awake oriented x2.  [name] who he lives with is at the bedside providing the history. In ED-Vitals stable saturating well on room air Tmax 99.4, labs with a stable CMP, anemia and leukopenia UA no evidence of UTI TSH 1.8. Head CT and MRI brain- 4 metastatic deposits in the brain shows progression since MRI from 05/13/2021, show interval growth, increased surrounding edema especially in the right parietal lobe, with evidence of prior hemorrhage with progression of hemorrhage in the right cerebellar lesion, left occipital lesion and right parietal lesion. He had new left deficits and could not walk. Starting on steroid and admission was requested, oncology has been consulted from ED.  Patient denies any pain, has weakness more on the left leg.   Assessment and Plan: * Leg weakness- (present on admission) Fall at home Left leg  weakness Non Small cell lung cancer with brain mets: MRI reviewed showing for metastatic deposits progression with evidence of prior hemorrhage. Heme-onc consulted in the ED.  Patient is alert awake mildly confused follows instruction well has weakness in left lower extremities.  We will keep on IV Decadron.  Await for further oncology recommendation.   Metastasis to brain [facility])- (present on admission) See #1.  Lung cancer metastatic to brain [facility])- (present on admission) See #1.  Anemia associated with chemotherapy Anemia due to chemotherapy Leukopenia due to chemotherapy: Monitor CBC.  Leukopenia See above.  Diabetes mellitus type 2 in nonobese [facility]) We will start patient on sliding scale insulin, resume glipizide.  Hold Januvia metformin  Hypertension Blood pressure stable.  Hold home antihypertensives  Dyslipidemia associated with type 2 diabetes mellitus ([place])- (present on admission) Resume statin  Goals of care, counseling/discussion Discussed CODE STATUS with patient and patient's sister at the bedside now full code.  Encourage to further discuss with sister   Overall prognosis does not appear bright patient's sister verbalized understanding, encourage for to discuss further goals of care and CODE STATUS.   Severity of Illness: The appropriate patient status for this patient is INPATIENT. Inpatient status is judged to be reasonable and necessary in order to provide the required intensity of service to ensure the patient's safety. The patient's presenting symptoms, physical exam findings, and initial radiographic and laboratory data in the context of their chronic comorbidities is felt to place them at high risk for further clinical deterioration. Furthermore, it is not anticipated that the patient will be  medically stable for discharge from the hospital within 2 midnights of admission.   * I certify that at the point of admission it is my clinical judgment that the  patient will require inpatient hospital care spanning beyond 2 midnights from the point of admission due to high intensity of service, high risk for further deterioration and high frequency of surveillance required.*   DVT prophylaxis: SCDs Start: 07/02/21 1626 Code Status:   Code Status: Full Code  Family Communication: Admission, patients condition and plan of care including tests being ordered have been discussed with the patient and sister who indicate understanding and agree with the plan and Code Status.  Consults called: oncology.  Review of Systems: All systems were reviewed and were negative except as mentioned in HPI above. Negative for fever Negative for chest pain Negative for shortness of breath  Past Medical History:  Diagnosis Date   Allergy    Diabetes mellitus without complication ([place])    Type II   Glaucoma 10/16/2020   History of radiation therapy    [place]- SBRT 02/21/19-02/28/19, Rt Lung- IMRT 02/29/20-04/16/20 Dr. Gery Pray   History of radiation therapy 10/30/2020   whole brain 10/17/2020-10/30/2020 Dr Gery Pray   Hyperlipidemia    Hypertension    Lung cancer ([place])    Stage IA2 (cT1b, N0) non-small cell lung cancer (adenocarcinoma)    Past Surgical History:  Procedure Laterality Date   ELECTROMAGNETIC NAVIGATION BROCHOSCOPY Right 02/02/2019   Procedure: VIDEO BRONCHOSCOPY WITH NAVIGATION AND FIDUCIAL PLACEMENT;  Surgeon: Garner Nash, DO;  Location: [facility];  Service: Cardiopulmonary;  Laterality: Right;   FUDUCIAL PLACEMENT Right 02/02/2019   Procedure: Placement Of Fuducial Right Upper Lobe;  Surgeon: Garner Nash, DO;  Location: [facility];  Service: Cardiopulmonary;  Laterality: Right;   IR IMAGING GUIDED PORT INSERTION  12/13/2020   none     VIDEO BRONCHOSCOPY WITH RADIAL ENDOBRONCHIAL ULTRASOUND Right 02/02/2019   Procedure: Video Bronchoscopy With Radial Endobronchial Ultrasound;  Surgeon: Garner Nash, DO;  Location: [place];  Service:  Cardiopulmonary;  Laterality: Right;     reports that he quit smoking about 6 years ago. His smoking use included cigarettes. He has a 48.00 pack-year smoking history. He quit smokeless tobacco use about 7 years ago. He reports that he does not drink alcohol and does not use drugs.  No Known Allergies  Family History  Problem Relation Age of Onset   Lung cancer Neg Hx      Prior to Admission medications   Medication Sig Start Date End Date Taking? Authorizing Provider  albuterol (VENTOLIN HFA) 108 (90 Base) MCG/ACT inhaler Inhale 2 puffs into the lungs every 6 (six) hours as needed for wheezing or shortness of breath. 01/19/19   Icard, Leory Plowman L, DO  amLODipine (NORVASC) 5 MG tablet Take 5 mg by mouth daily. 04/03/21   [provider]  ammonium lactate (AMLACTIN) 12 % lotion Apply 1 application topically as needed for dry skin. 01/24/21   Ailene Ards, NP  atorvastatin (LIPITOR) 20 MG tablet Take 20 mg by mouth daily. 04/03/21   [provider]  blood glucose meter kit and supplies KIT Dispense based on patient and insurance preference. Use up to four times daily as directed. 10/23/20   Elodia Florence., MD  Blood Pressure Monitoring (BLOOD PRESSURE MONITOR/ARM) DEVI Check BP once a day. DX I 10 04/18/19   Horald Pollen, MD  dexamethasone (DECADRON) 2 MG tablet Take 1 tablet (2 mg total)  by mouth daily. 04/09/21   Ventura Sellers, MD  erythromycin ophthalmic ointment Place 1 application into the left eye at bedtime. 05/01/21   Horald Pollen, MD  folic acid (FOLVITE) 1 MG tablet Take 1 tablet (1 mg total) by mouth daily. 05/01/21   Horald Pollen, MD  gabapentin (NEURONTIN) 300 MG capsule Take 1 capsule by mouth at bedtime 06/23/21   Horald Pollen, MD  glipiZIDE (GLUCOTROL) 5 MG tablet TAKE 1 TABLET BY MOUTH TWICE DAILY WITH A MEAL 04/23/21   Sagardia, Ines Bloomer, MD  latanoprost (XALATAN) 0.005 % ophthalmic solution Place 1 drop into  both eyes at bedtime. 01/24/21   Ailene Ards, NP  levETIRAcetam (KEPPRA) 1000 MG tablet Take 1 tablet by mouth twice daily 05/29/21   Horald Pollen, MD  lidocaine-prilocaine (EMLA) cream Apply 1 application topically as needed. Patient taking differently: Apply 1 application topically as needed (for port before tx). 11/30/20   Orson Slick, MD  metFORMIN (GLUCOPHAGE) 500 MG tablet TAKE 1 TABLET BY MOUTH TWICE DAILY WITH MEALS 06/25/21   Sagardia, Ines Bloomer, MD  Multiple Vitamins-Minerals (MULTIVITAMIN MEN) TABS Take 1 tablet by mouth daily.    [provider]  ondansetron (ZOFRAN) 8 MG tablet Take 1 tablet (8 mg total) by mouth every 8 (eight) hours as needed for nausea or vomiting. 11/30/20   Orson Slick, MD  pantoprazole (PROTONIX) 40 MG tablet Take 1 tablet (40 mg total) by mouth daily. 05/01/21   Horald Pollen, MD  potassium chloride SA (KLOR-CON) 20 MEQ tablet Take 20 mEq by mouth daily. 01/13/21   [provider]  prochlorperazine (COMPAZINE) 10 MG tablet Take 1 tablet (10 mg total) by mouth every 6 (six) hours as needed for nausea or vomiting. 11/30/20   Orson Slick, MD  sitaGLIPtin (JANUVIA) 100 MG tablet Take 1 tablet (100 mg total) by mouth daily. 05/01/21 07/30/21  Horald Pollen, MD    Physical Exam: Vitals:   07/02/21 1315 07/02/21 1426 07/02/21 1530 07/02/21 1655  BP: 124/72 119/76 130/77 (!) 144/85  Pulse: 85 87 83 81  Resp: _0 Temp:    98.3 F (36.8 C)  TempSrc:    Oral  SpO2: 95% 94% 97% 98%    General exam: AAO0x2-3 , NAD, weak appearing. HEENT:Oral mucosa moist, Ear/Nose WNL grossly, dentition normal. Respiratory system: bilaterally clear,no wheezing or crackles,no use of accessory muscle Cardiovascular system: S1 & S2 +, No JVD,. Gastrointestinal system: Abdomen soft, NT,ND, BS+ Nervous System:Alert, awake, moving upper extremities well, mild weakness on left lower extremity  Extremities: No edema, distal  peripheral pulses palpable.  Skin: No rashes,no icterus. MSK: Normal muscle bulk,tone, power   Labs on Admission: I have personally reviewed following labs and imaging studies  CBC: Recent Labs  Lab 06/28/21 1338 07/02/21 1142  WBC 5.0 3.3*  NEUTROABS 4.1 3.1  HGB 11.2* 12.6*  HCT 34.7* 38.8*  MCV 98.9 98.7  PLT 225 979   Basic Metabolic Panel: Recent Labs  Lab 06/28/21 1338 07/02/21 1142  NA 139 141  K 4.1 3.6  CL 104 104  CO2 28 30  GLUCOSE 278* 114*  BUN 11 12  CREATININE 0.82 0.77  CALCIUM 9.4 9.5   GFR: Estimated Creatinine Clearance: 73.1 mL/min (by C-G formula based on SCr of 0.77 mg/dL). Liver Function Tests: Recent Labs  Lab 06/28/21 1338 07/02/21 1142  AST 24 38  ALT 35 40  ALKPHOS  79 80  BILITOT 0.3 0.3  PROT 6.8 7.0  ALBUMIN 4.1 3.9   No results for input(s): LIPASE, AMYLASE in the last 168 hours. No results for input(s): AMMONIA in the last 168 hours. Coagulation Profile: No results for input(s): INR, PROTIME in the last 168 hours. Cardiac Enzymes: No results for input(s): CKTOTAL, CKMB, CKMBINDEX, TROPONINI in the last 168 hours. BNP (last 3 results) No results for input(s): PROBNP in the last 8760 hours. HbA1C: No results for input(s): HGBA1C in the last 72 hours. CBG: Recent Labs  Lab 07/02/21 1144  GLUCAP 105*   Lipid Profile: No results for input(s): CHOL, HDL, LDLCALC, TRIG, CHOLHDL, LDLDIRECT in the last 72 hours. Thyroid Function Tests: Recent Labs    07/02/21 1142  TSH 1.853   Anemia Panel: No results for input(s): VITAMINB12, FOLATE, FERRITIN, TIBC, IRON, RETICCTPCT in the last 72 hours. Urine analysis:    Component Value Date/Time   COLORURINE YELLOW 07/02/2021 [place] 07/02/2021 1123   LABSPEC 1.020 07/02/2021 1123   PHURINE 6.0 07/02/2021 1123   GLUCOSEU NEGATIVE 07/02/2021 1123   HGBUR NEGATIVE 07/02/2021 [place] 07/02/2021 1123   KETONESUR NEGATIVE 07/02/2021 1123    PROTEINUR 30 (A) 07/02/2021 1123   NITRITE NEGATIVE 07/02/2021 1123   LEUKOCYTESUR NEGATIVE 07/02/2021 1123    Radiological Exams on Admission: CT Head Wo Contrast  Result Date: 07/02/2021 CLINICAL DATA:  High sudden onset of generalized weakness especially lower extremities, new onset tremors, fell trying to get out of bed, question stroke; history non-small cell lung cancer with radiation therapy, hypertension, diabetes mellitus EXAM: CT HEAD WITHOUT CONTRAST TECHNIQUE: Contiguous axial images were obtained from the base of the skull through the vertex without intravenous contrast. RADIATION DOSE REDUCTION: This exam was performed according to the departmental dose-optimization program which includes automated exposure control, adjustment of the mA and/or kV according to patient size and/or use of iterative reconstruction technique. COMPARISON:  10/15/2020 CT head, MR brain 05/13/2021 FINDINGS: Brain: Generalized atrophy. Normal ventricular morphology. No midline shift or mass effect. Small vessel chronic ischemic changes of deep cerebral white matter. Areas of vasogenic edema again identified within the RIGHT hemisphere. Area of higher attenuation at the high RIGHT parietal region question small hemorrhagic metastasis, seen previously. Additional small high attenuation focus LEFT occipital consistent with additional hemorrhagic metastasis. Both of these lesions were seen on prior MR. Small basal ganglia calcifications bilaterally. Additional small metastatic lesions seen previously at the inferior margin of the RIGHT occipital lobe on prior MR is not seen on current study. Vascular: Minimal atherosclerotic calcification of internal carotid arteries at skull base Skull: Intact Sinuses/Orbits: Opacified LEFT mastoid air cells. Mucosal thickening ethmoid air cells and sphenoid sinus. Other: N/A IMPRESSION: Atrophy with small vessel chronic ischemic changes of deep cerebral white matter. Areas of vasogenic  edema again identified within the RIGHT hemisphere. Small hemorrhagic metastases at the high RIGHT parietal lobe and LEFT occipital lobe. No additional areas of hemorrhage or infarction. Electronically Signed   By: Lavonia Dana M.D.   On: 07/02/2021 12:39   MR Brain W and Wo Contrast  Result Date: 07/02/2021 CLINICAL DATA:  Metastatic lung cancer.  Assess treatment response. EXAM: MRI HEAD WITHOUT AND WITH CONTRAST TECHNIQUE: Multiplanar, multiecho pulse sequences of the brain and surrounding structures were obtained without and with intravenous contrast. CONTRAST:  7.64m GADAVIST GADOBUTROL 1 MMOL/ML IV SOLN COMPARISON:  MRI head with contrast 05/13/2021 FINDINGS: Brain: 4 enhancing metastatic deposit in the brain,  all lesions show progression. Right posterior cerebellar lesion now measures 13.5 mm with progression. Associated hemorrhage shows mild progression. Mild edema surrounding the lesion with progression. Left occipital lesion shows progression now measuring 11.5 mm in diameter. Cystic necrosis. Progression of associated hemorrhage in the lesion. Progression of surrounding edema. Right medial parietal lobe lesion 22 mm in diameter with progression. Mild associated hemorrhage unchanged. Progression of surrounding vasogenic edema. Rim enhancing cystic lesion in the right parietal lobe measures 20.5 mm in diameter with progression. Progressive hemorrhage. Moderate vasogenic edema has progressed. No new lesions. Ventricle size normal.  No acute infarct.  No midline shift. Vascular: Normal arterial flow voids Skull and upper cervical spine: No metastatic disease to the calvarium. Sinuses/Orbits: Mild mucosal edema paranasal sinuses. Negative orbit Other: None IMPRESSION: 4 metastatic deposits in the brain show progression since MRI 05/13/2021. All lesions show interval growth. Increased surrounding edema specially in the right parietal lobe. All lesions show evidence of prior hemorrhage with progression of  hemorrhage in the right cerebellar lesion, left occipital lesion, and right parietal lesion. Electronically Signed   By: Franchot Gallo M.D.   On: 07/02/2021 15:26      Antonieta Pert MD [facility]  If 7PM-7AM, please contact night-coverage [email or web address]  07/02/2021, 5:03 PM

## 2021-07-03 ENCOUNTER — Other Ambulatory Visit: Payer: Self-pay | Admitting: Radiation Therapy

## 2021-07-03 DIAGNOSIS — I10 Essential (primary) hypertension: Secondary | ICD-10-CM

## 2021-07-03 DIAGNOSIS — C7931 Secondary malignant neoplasm of brain: Secondary | ICD-10-CM | POA: Diagnosis not present

## 2021-07-03 DIAGNOSIS — Z7189 Other specified counseling: Secondary | ICD-10-CM | POA: Diagnosis not present

## 2021-07-03 DIAGNOSIS — W19XXXA Unspecified fall, initial encounter: Secondary | ICD-10-CM

## 2021-07-03 DIAGNOSIS — Y92009 Unspecified place in unspecified non-institutional (private) residence as the place of occurrence of the external cause: Secondary | ICD-10-CM

## 2021-07-03 DIAGNOSIS — C3490 Malignant neoplasm of unspecified part of unspecified bronchus or lung: Secondary | ICD-10-CM | POA: Diagnosis not present

## 2021-07-03 DIAGNOSIS — D6481 Anemia due to antineoplastic chemotherapy: Secondary | ICD-10-CM

## 2021-07-03 DIAGNOSIS — R29898 Other symptoms and signs involving the musculoskeletal system: Secondary | ICD-10-CM | POA: Diagnosis present

## 2021-07-03 DIAGNOSIS — T451X5A Adverse effect of antineoplastic and immunosuppressive drugs, initial encounter: Secondary | ICD-10-CM

## 2021-07-03 DIAGNOSIS — D72810 Lymphocytopenia: Secondary | ICD-10-CM

## 2021-07-03 LAB — CBC WITH DIFFERENTIAL/PLATELET
Abs Immature Granulocytes: 0.02 10*3/uL (ref 0.00–0.07)
Basophils Absolute: 0 10*3/uL (ref 0.0–0.1)
Basophils Relative: 0 %
Eosinophils Absolute: 0 10*3/uL (ref 0.0–0.5)
Eosinophils Relative: 0 %
HCT: 38.3 % — ABNORMAL LOW (ref 39.0–52.0)
Hemoglobin: 12.5 g/dL — ABNORMAL LOW (ref 13.0–17.0)
Immature Granulocytes: 1 %
Lymphocytes Relative: 8 %
Lymphs Abs: 0.2 10*3/uL — ABNORMAL LOW (ref 0.7–4.0)
MCH: 32.1 pg (ref 26.0–34.0)
MCHC: 32.6 g/dL (ref 30.0–36.0)
MCV: 98.2 fL (ref 80.0–100.0)
Monocytes Absolute: 0.1 10*3/uL (ref 0.1–1.0)
Monocytes Relative: 2 %
Neutro Abs: 2.7 10*3/uL (ref 1.7–7.7)
Neutrophils Relative %: 89 %
Platelets: 230 10*3/uL (ref 150–400)
RBC: 3.9 MIL/uL — ABNORMAL LOW (ref 4.22–5.81)
RDW: 12.5 % (ref 11.5–15.5)
WBC: 3.1 10*3/uL — ABNORMAL LOW (ref 4.0–10.5)
nRBC: 0.7 % — ABNORMAL HIGH (ref 0.0–0.2)

## 2021-07-03 LAB — GLUCOSE, CAPILLARY
Glucose-Capillary: 123 mg/dL — ABNORMAL HIGH (ref 70–99)
Glucose-Capillary: 132 mg/dL — ABNORMAL HIGH (ref 70–99)
Glucose-Capillary: 143 mg/dL — ABNORMAL HIGH (ref 70–99)
Glucose-Capillary: 161 mg/dL — ABNORMAL HIGH (ref 70–99)
Glucose-Capillary: 217 mg/dL — ABNORMAL HIGH (ref 70–99)
Glucose-Capillary: 222 mg/dL — ABNORMAL HIGH (ref 70–99)
Glucose-Capillary: 267 mg/dL — ABNORMAL HIGH (ref 70–99)

## 2021-07-03 LAB — RENAL FUNCTION PANEL
Albumin: 3.3 g/dL — ABNORMAL LOW (ref 3.5–5.0)
Anion gap: 10 (ref 5–15)
BUN: 16 mg/dL (ref 8–23)
CO2: 23 mmol/L (ref 22–32)
Calcium: 9.1 mg/dL (ref 8.9–10.3)
Chloride: 107 mmol/L (ref 98–111)
Creatinine, Ser: 0.83 mg/dL (ref 0.61–1.24)
GFR, Estimated: >60 mL/min (ref >60)
Glucose, Bld: 223 mg/dL — ABNORMAL HIGH (ref 70–99)
Phosphorus: 5.3 mg/dL — ABNORMAL HIGH (ref 2.5–4.6)
Potassium: 4 mmol/L (ref 3.5–5.1)
Sodium: 140 mmol/L (ref 135–145)

## 2021-07-03 LAB — MAGNESIUM: Magnesium: 1.9 mg/dL (ref 1.7–2.4)

## 2021-07-03 MED ORDER — INSULIN GLARGINE-YFGN 100 UNIT/ML ~~LOC~~ SOLN
10.0000 [IU] | Freq: Every day | SUBCUTANEOUS | Status: DC
Start: 2021-07-03 — End: 2021-07-04
  Administered 2021-07-03: 10 [IU] via SUBCUTANEOUS
  Filled 2021-07-03 (×2): qty 0.1

## 2021-07-03 NOTE — Assessment & Plan Note (Addendum)
Likely from brain mets.  4/5 motor strength in BLE on my exam. ?-Decadron as above. ?-Continue PT/OT ?

## 2021-07-03 NOTE — Evaluation (Signed)
Physical Therapy Evaluation ?Patient Details ?Name: Benjamin Willis. ?MRN: [id number] ?DOB: [birth year] ?Today's Date: 07/03/2021 ? ?History of Present Illness ? 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  ?Clinical Impression ? Pt admitted with above diagnosis. Pt ambulated 15' with ongoing verbal cues for positioning in RW, distance limited by pt having large BM in the hallway. Pt has difficulty following some commands. His sister, who is primary caregiver, was present and provided pt's history.  Pt currently with functional limitations due to the deficits listed below (see PT Problem List). Pt will benefit from skilled PT to increase their independence and safety with mobility to allow discharge to the venue listed below.   ?   ?   ? ?Recommendations for follow up therapy are one component of a multi-disciplinary discharge planning process, led by the attending physician.  Recommendations may be updated based on patient status, additional functional criteria and insurance authorization. ? ?Follow Up Recommendations Skilled nursing-short term rehab (<3 hours/day) ? ?  ?Assistance Recommended at Discharge Frequent or constant Supervision/Assistance  ?Patient can return home with the following ? Assistance with cooking/housework;Direct supervision/assist for medications management;Direct supervision/assist for financial management;Help with stairs or ramp for entrance;Assist for transportation;A lot of help with bathing/dressing/bathroom;A lot  of help with walking and/or transfers ? ?  ?Equipment Recommendations None recommended by PT  ?Recommendations for Other Services ?    ?  ?Functional Status Assessment Patient has had a recent decline in their functional status and demonstrates the ability to make significant improvements in function in a reasonable and predictable amount of time.  ? ?  ?Precautions / Restrictions Precautions ?Precautions: Fall ?Precaution Comments: fell just prior to admission, family denies other falls in past 6 months ?Restrictions ?Weight Bearing Restrictions: No  ? ?  ? ?Mobility ? Bed Mobility ?Overal bed mobility: Needs Assistance ?Bed Mobility: Supine to Sit ?  ?  ?Supine to sit: Min assist ?  ?  ?General bed mobility comments: min A to raise trunk and pivot hips to EOB ?  ? ?Transfers ?Overall transfer level: Needs assistance ?Equipment used: Rolling walker (2 wheels) ?Transfers: Sit to/from Stand, Bed to chair/wheelchair/BSC ?Sit to Stand: Mod assist ?Stand pivot transfers: Mod assist ?  ?  ?  ?  ?General transfer comment: assist to power up and steady, posterior lean initially, max VCs to back up fully to recliner, mod A to guide hips to recliner ?  ? ?Ambulation/Gait ?Ambulation/Gait assistance: Min assist ?Gait Distance (Feet): 15 Feet ?Assistive device: Rolling walker (2 wheels) ?Gait Pattern/deviations: Step-to pattern, Decreased step length - right, Decreased step length - left, Trunk flexed ?Gait velocity: decr ?  ?  ?General Gait Details: max onging VCs for increasing step length, poor follow through with verbal cues given; pt had large BM in hallway, poor motor planning, VCs to keep B hands on RW as pt frequently let go to reach for wall/doorway ? ?Stairs ?  ?  ?  ?  ?  ? ?Wheelchair Mobility ?  ? ?Modified Rankin (Stroke Patients Only) ?  ? ?  ? ?  Balance Overall balance assessment: Needs assistance ?Sitting-balance support: Feet supported, No upper extremity supported ?Sitting balance-Leahy Scale: Fair ?  ?   ?Standing balance support: Bilateral upper extremity supported ?Standing balance-Leahy Scale: Poor ?Standing balance comment: relies on BUE support, posterior lean initially ?  ?  ?  ?  ?  ?  ?  ?  ?  ?  ?  ?   ? ? ? ?Pertinent Vitals/Pain Pain Assessment ?Pain Assessment: No/denies pain  ? ? ?Home Living Family/patient expects to be discharged to:: Private residence ?Living Arrangements: Other relatives (sister) ?Available Help at Discharge: Family;Available 24 hours/day ?Type of Home: House ?Home Access: Stairs to enter ?  ?Entrance Stairs-Number of Steps: 3 ?  ?Home Layout: Two level;Able to live on main level with bedroom/bathroom ?Home Equipment: Rolling Walker (2 wheels);BSC/3in1;Grab bars - tub/shower;Cane - single point;Shower seat;Hand held shower head ?   ?  ?Prior Function   ?  ?  ?  ?  ?  ?  ?Mobility Comments: walked with RW without assistance (including walking outside) ?  ?  ? ? ?Hand Dominance  ? Dominant Hand: Right ? ?  ?Extremity/Trunk Assessment  ? Upper Extremity Assessment ?Upper Extremity Assessment: Defer to OT evaluation ?  ? ?Lower Extremity Assessment ?Lower Extremity Assessment: Generalized weakness;Difficult to assess due to impaired cognition (unclear responses to questions regarding sensation to light touch B feet. Difficulty following commands with manual muscle testing.) ?  ? ?Cervical / Trunk Assessment ?Cervical / Trunk Assessment: Normal  ?Communication  ? Communication: Receptive difficulties;Expressive difficulties (some difficulty following commands, increased processing time, difficulty getting some words out, sister provided details of PLOF)  ?Cognition Arousal/Alertness: Awake/alert ?Behavior During Therapy: Flat affect ?Overall Cognitive Status: History of cognitive impairments - at baseline ?  ?  ?  ?  ?  ?  ?  ?  ?  ?  ?  ?  ?  ?  ?  ?  ?General Comments: increased processing time, difficulty following some commands with manual muscle testing, oriented to self, sister  provided details of prior level of function ?  ?  ? ?  ?General Comments   ? ?  ?Exercises    ? ?Assessment/Plan  ?  ?PT Assessment Patient needs continued PT services  ?PT Problem List Decreased mobility;Decreased activity tolerance;Decreased cognition;Decreased coordination;Decreased strength ? ?   ?  ?PT Treatment Interventions Therapeutic activities;Gait training;Therapeutic exercise;Functional mobility training   ? ?PT Goals (Current goals can be found in the Care Plan section)  ?Acute Rehab PT Goals ?Patient Stated Goal: short term rehab per sister ?PT Goal Formulation: With family ?Time For Goal Achievement: 07/17/21 ?Potential to Achieve Goals: Fair ? ?  ?Frequency Min 2X/week ?  ? ? ?Co-evaluation   ?  ?  ?  ?  ? ? ?  ?AM-PAC PT "6 Clicks" Mobility  ?Outcome Measure Help needed turning from your back to your side while in a flat bed without using bedrails?: A Little ?Help needed moving from lying on your back to sitting on the side of a flat bed without using bedrails?: A Little ?Help needed moving to and from a bed to a chair (including a wheelchair)?: A Lot ?Help needed standing up from a chair using your arms (e.g., wheelchair or bedside chair)?: A Little ?Help needed to walk in hospital room?: A Lot ?Help needed climbing 3-5 steps with a railing? : A Lot ?6 Click Score: 15 ? ?  ?End of Session Equipment Utilized During Treatment: Gait  belt ?Activity Tolerance: Patient tolerated treatment well ?Patient left: in chair;with call bell/phone within reach;with chair alarm set;with family/visitor present ?Nurse Communication: Mobility status ?PT Visit Diagnosis: Unsteadiness on feet (R26.81);Muscle weakness (generalized) (M62.81);Difficulty in walking, not elsewhere classified (R26.2);History of falling (Z91.81) ?  ? ?Time: 3552-1747 ?PT Time Calculation (min) (ACUTE ONLY): 40 min ? ? ?Charges:   PT Evaluation ?$PT Eval Moderate Complexity: 1 Mod ?PT Treatments ?$Gait Training: 8-22 mins ?$Therapeutic  Activity: 8-22 mins ?  ?   ? ?Blondell Reveal Kistler PT 07/03/2021  ?Acute Rehabilitation Services ?Pager [phone] ?Office [phone] ? ? ?

## 2021-07-03 NOTE — Assessment & Plan Note (Addendum)
Resolved

## 2021-07-03 NOTE — Consult Note (Signed)
Consultation Note Date: 07/03/2021   Patient Name: Benjamin Willis.  DOB: 12-29-1945  MRN: [id number]  Age / Sex: 76 y.o., male  PCP: Horald Pollen, MD Referring Physician: Mercy Riding, MD  Reason for Consultation: Establishing goals of care  HPI/Patient Profile: 76 y.o. 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 Lorenso Courier presenting with generalized weakness, BLE weakness, tremors and fall when he tried to get out of the bed, and admitted for fall at home, left leg weakness and generalized weakness.  Vitals are stable.  Tmax 99.4.  Labs with stable CMP, anemia and leukopenia.  UA not suggestive for UTI.   CT head and MRI brain with for metastatic deposits in brain (progressed since his MRI on 05/13/2021) increased surrounding edema in the right parietal lobe with evidence of prior hemorrhage with progression of hemorrhage into right cerebellar lesion, left occipital lesion and right parietal lesion.  Started on steroid.  Reportedly oncology consulted from ED Clinical Assessment and Goals of Care: A palliative medicine consultation has been requested for ongoing goals of care discussions.   Patient is awake alert resting in bed, he is able to feed himself breakfast. I introduced myself and palliative care as follows: Palliative medicine is specialized medical care for people living with serious illness. It focuses on providing relief from the symptoms and stress of a serious illness. The goal is to improve quality of life for both the patient and the family. Goals of care: Broad aims of medical therapy in relation to the patient's values and preferences. Our aim is to provide medical care aimed at enabling patients to achieve the goals that matter most to them, given the circumstances of their particular  medical situation and their constraints.   Patient talks slowly, but is able to provide history and have some discussions with me. He states that he was using his walker and was trying to be more active. His appetite is good. He doesn't remember how he fell, he is able to tell me that he is in the hospital because he took a fall. He tells me that he is trying to do all that he can to get better. He endorses great trust in his oncologist [name].   Brief discussion was held with the patient about his current condition, recent imaging and his goals and wishes. Patient wants to discuss further with his oncologist. At present, he endorses for full scope care.   NEXT OF KIN  Sister   SUMMARY OF RECOMMENDATIONS   Full Code/Full Scope care. Patient wishes to discuss further with his medical oncologist.  PMT to follow.   Code Status/Advance Care Planning: Full code   Symptom Management:    Palliative Prophylaxis:  Frequent Pain Assessment  Additional Recommendations (Limitations, Scope, Preferences): Full Scope Treatment  Psycho-social/Spiritual:  Desire for further Chaplaincy support:yes Additional Recommendations: Caregiving  Support/Resources  Prognosis:  Unable to  determine  Discharge Planning: To Be Determined      Primary Diagnoses: Present on Admission:  Metastasis to brain [facility])  Leg weakness  Lung cancer metastatic to brain ([place])  Dyslipidemia associated with type 2 diabetes mellitus ([place])   I have reviewed the medical record, interviewed the patient and family, and examined the patient. The following aspects are pertinent.  Past Medical History:  Diagnosis Date   Allergy    Diabetes mellitus without complication ([place])    Type II   Glaucoma 10/16/2020   History of radiation therapy    [place]- SBRT 02/21/19-02/28/19, Rt Lung- IMRT 02/29/20-04/16/20 Dr. Gery Pray   History of radiation therapy 10/30/2020   whole brain 10/17/2020-10/30/2020 Dr Gery Pray    Hyperlipidemia    Hypertension    Lung cancer ([place])    Stage IA2 (cT1b, N0) non-small cell lung cancer (adenocarcinoma)   Social History   Socioeconomic History   Marital status: Single    Spouse name: Not on file   Number of children: 0   Years of education: Not on file   Highest education level: 9th grade  Occupational History   Not on file  Tobacco Use   Smoking status: Former    Packs/day: 1.00    Years: 48.00    Pack years: 48.00    Types: Cigarettes    Quit date: 02/20/2015    Years since quitting: 6.3   Smokeless tobacco: Former    Quit date: 10/2013  Vaping Use   Vaping Use: Never used  Substance and Sexual Activity   Alcohol use: No   Drug use: No   Sexual activity: Yes    Birth control/protection: Condom  Other Topics Concern   Not on file  Social History Narrative   Not on file   Social Determinants of Health   Financial Resource Strain: Not on file  Food Insecurity: Not on file  Transportation Needs: Not on file  Physical Activity: Not on file  Stress: Not on file  Social Connections: Not on file   Family History  Problem Relation Age of Onset   Lung cancer Neg Hx    Scheduled Meds:  dexamethasone (DECADRON) injection  4 mg Intravenous Q6H   insulin aspart  0-15 Units Subcutaneous TID WC   insulin glargine-yfgn  10 Units Subcutaneous Daily   pantoprazole  40 mg Oral Daily   Continuous Infusions:  sodium chloride 50 mL/hr at 07/03/21 0549   levETIRAcetam 1,000 mg (07/03/21 0454)   PRN Meds:.acetaminophen **OR** acetaminophen, ondansetron **OR** ondansetron (ZOFRAN) IV, oxyCODONE Medications Prior to Admission:  Prior to Admission medications   Medication Sig Start Date End Date Taking? Authorizing Provider  albuterol (VENTOLIN HFA) 108 (90 Base) MCG/ACT inhaler Inhale 2 puffs into the lungs every 6 (six) hours as needed for wheezing or shortness of breath. 01/19/19  Yes Icard, Bradley L, DO  amLODipine (NORVASC) 10 MG tablet Take 10 mg by  mouth daily.   Yes [provider]  ammonium lactate (AMLACTIN) 12 % lotion Apply 1 application topically as needed for dry skin. 01/24/21  Yes Ailene Ards, NP  dexamethasone (DECADRON) 2 MG tablet Take 1 tablet (2 mg total) by mouth daily. 04/09/21  Yes Vaslow, Acey Lav, MD  folic acid (FOLVITE) 1 MG tablet Take 1 tablet (1 mg total) by mouth daily. 05/01/21  Yes Sagard[place], Ines Bloomer, MD  gabapentin (NEURONTIN) 300 MG capsule Take 1 capsule by mouth at bedtime Patient taking differently: Take 300 mg by mouth at  bedtime. 06/23/21  Yes Sagardia, Ines Bloomer, MD  glipiZIDE (GLUCOTROL) 5 MG tablet TAKE 1 TABLET BY MOUTH TWICE DAILY WITH A MEAL Patient taking differently: Take 5 mg by mouth 2 (two) times daily before a meal. 04/23/21  Yes Sagardia, Ines Bloomer, MD  latanoprost (XALATAN) 0.005 % ophthalmic solution Place 1 drop into both eyes at bedtime. 01/24/21  Yes Ailene Ards, NP  levETIRAcetam (KEPPRA) 1000 MG tablet Take 1 tablet by mouth twice daily Patient taking differently: Take 1,000 mg by mouth in the morning and at bedtime. 05/29/21  Yes Sagardia, Ines Bloomer, MD  lidocaine-prilocaine (EMLA) cream Apply 1 application topically as needed. Patient taking differently: Apply 1 application topically as needed (for port before treatment). 11/30/20  Yes Orson Slick, MD  metFORMIN (GLUCOPHAGE) 500 MG tablet TAKE 1 TABLET BY MOUTH TWICE DAILY WITH MEALS Patient taking differently: Take 500 mg by mouth daily with breakfast. 06/25/21  Yes [place], Ines Bloomer, MD  multivitamin (ONE-A-DAY MEN'S) TABS tablet Take 1 tablet by mouth daily with breakfast.   Yes [provider]  ondansetron (ZOFRAN) 8 MG tablet Take 1 tablet (8 mg total) by mouth every 8 (eight) hours as needed for nausea or vomiting. 11/30/20  Yes Orson Slick, MD  pantoprazole (PROTONIX) 40 MG tablet Take 1 tablet (40 mg total) by mouth daily. Patient taking differently: Take 40 mg by mouth daily before  breakfast. 05/01/21  Yes Sagardia, Ines Bloomer, MD  prochlorperazine (COMPAZINE) 10 MG tablet Take 1 tablet (10 mg total) by mouth every 6 (six) hours as needed for nausea or vomiting. 11/30/20  Yes Orson Slick, MD  rosuvastatin (CRESTOR) 20 MG tablet Take 20 mg by mouth daily.   Yes [provider]  sitaGLIPtin (JANUVIA) 100 MG tablet Take 1 tablet (100 mg total) by mouth daily. 05/01/21 07/30/21 Yes Sagardia, Ines Bloomer, MD  atorvastatin (LIPITOR) 20 MG tablet Take 20 mg by mouth daily. Patient not taking: Reported on 07/02/2021 04/03/21   [provider]  blood glucose meter kit and supplies KIT Dispense based on patient and insurance preference. Use up to four times daily as directed. 10/23/20   Elodia Florence., MD  Blood Pressure Monitoring (BLOOD PRESSURE MONITOR/ARM) DEVI Check BP once a day. DX I 10 04/18/19   Horald Pollen, MD  erythromycin ophthalmic ointment Place 1 application into the left eye at bedtime. Patient not taking: Reported on 07/02/2021 05/01/21   Horald Pollen, MD   No Known Allergies Review of Systems + weakness   Physical Exam Awake alert  Appears with generalized weakness Clear breath sounds  S 1 S 2 No edema  Abdomen non tender   Vital Signs: BP 133/85 (BP Location: Left Arm)    Pulse 88    Temp 98.2 F (36.8 C) (Oral)    Resp 16    Ht _0  (1.676 m)    Wt 71.5 kg    SpO2 98%    BMI 25.44 kg/m  Pain Scale: 0-10   Pain Score: 0-No pain   SpO2: SpO2: 98 % O2 Device:SpO2: 98 % O2 Flow Rate: .   IO: Intake/output summary:  Intake/Output Summary (Last 24 hours) at 07/03/2021 1313 Last data filed at 07/03/2021 0910 Gross per 24 hour  Intake 1086.73 ml  Output 950 ml  Net 136.73 ml    LBM: Last BM Date : 07/03/21 Baseline Weight: Weight: 71.5 kg Most recent weight: Weight: 71.5 kg  Palliative Assessment/Data:   PPS 50%  Time In: 12 Time Out: 1300 Time Total: 60 min.  Greater than 50%  of this time  was spent counseling and coordinating care related to the above assessment and plan.  Signed by: Loistine Chance, MD   Please contact Palliative Medicine Team phone at [phone] for questions and concerns.  For individual provider: See Shea Evans

## 2021-07-03 NOTE — Assessment & Plan Note (Signed)
Likely due to brain mets.  No significant injury ?-Fall precaution ?-PT/OT ?

## 2021-07-03 NOTE — Progress Notes (Signed)
Hematology/Oncology Progress Note ? ?Clinical Summary: 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 ? ?Interval History: ?-- [name] was last seen on 06/27/2021 for cycle 9 of maintenance pembrolizumab/pemetrexed.  He was at his baseline level of health at that time ?--Presented to the emergency department on 07/02/2021 with rapid onset of weakness and a fall from his bed ?--MRI brain performed yesterday showed for metastatic deposits with progression since his last MRI on 05/13/2021. ?--Palliative care has been consulted ? ?O: ? ?Vitals:  ? 07/03/21 0440 07/03/21 1320  ?BP: 133/85 120/87  ?Pulse: 88 95  ?Resp: 16 14  ?Temp: 98.2 ?F (36.8 ?C) (!) 97.3 ?F (36.3 ?C)  ?SpO2: 98% 97%  ? ?CMP Latest Ref Rng & Units 07/03/2021 07/02/2021 06/28/2021  ?Glucose 70 - 99 mg/dL 223(H) 114(H) 278(H)  ?BUN 8 - 23 mg/dL 16 12 11   ?Creatinine 0.61 - 1.24 mg/dL 0.83 0.77 0.82  ?Sodium 135 - 145 mmol/L 140 141 139  ?Potassium 3.5 - 5.1 mmol/L 4.0 3.6 4.1  ?Chloride 98 - 111 mmol/L 107 104 104  ?CO2 22 - 32 mmol/L 23 30 28   ?Calcium 8.9 - 10.3 mg/dL 9.1 9.5 9.4  ?Total Protein 6.5 - 8.1 g/dL - 7.0 6.8  ?Total Bilirubin 0.3 - 1.2 mg/dL - 0.3 0.3  ?Alkaline Phos 38 - 126 U/L - 80 79  ?AST 15 - 41 U/L - 38 24  ?ALT 0 - 44 U/L - 40 35  ? ?CBC Latest Ref Rng & Units 07/03/2021 07/02/2021 06/28/2021  ?WBC 4.0 - 10.5 K/uL 3.1(L) 3.3(L) 5.0  ?Hemoglobin 13.0 - 17.0 g/dL 12.5(L) 12.6(L) 11.2(L)  ?Hematocrit 39.0 - 52.0 % 38.3(L) 38.8(L) 34.7(L)  ?Platelets 150 - 400 K/uL 230 244 225  ?  ? ? ?GENERAL: well appearing elderly African-American male in NAD  ?SKIN: skin color, texture, turgor are normal, no rashes or significant lesions ?EYES: conjunctiva are pink and non-injected, sclera clear ?LUNGS: clear to auscultation and percussion with normal breathing effort ?HEART: regular rate & rhythm  and no murmurs and no lower extremity edema ?Musculoskeletal: no cyanosis of digits and no clubbing  ?PSYCH: alert & oriented x 3, fluent speech ?NEURO: no focal motor/sensory deficits ? ?Assessment/Plan: ?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 ? 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  Treatment will need to be directed at the brain metastases. ?--Oncology will continue to follow ? ? ?Ledell Peoples, MD ?Department of Hematology/Oncology ?[place] [facility] ?Phone: [phone] ?Pager: [phone] ?Email: [email or web address] ? ?

## 2021-07-03 NOTE — Progress Notes (Signed)
PROGRESS NOTE  Benjamin Willis. TSV:[id number] DOB: [birth year]   PCP: Horald Pollen, MD  Patient is from: Home.  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 Lorenso Courier presenting with generalized weakness, BLE weakness, tremors and fall when he tried to get out of the bed, and admitted for fall at home, left leg weakness and generalized weakness.  Vitals are stable.  Tmax 99.4.  Labs with stable CMP, anemia and leukopenia.  UA not suggestive for UTI.  CT head and MRI brain with for metastatic deposits in brain (progressed since his MRI on 05/13/2021) increased surrounding edema in the right parietal lobe with evidence of prior hemorrhage with progression of hemorrhage into right cerebellar lesion, left occipital lesion and right parietal lesion.  Started on steroid.  Reportedly oncology consulted from ED    Subjective: Seen and examined earlier this morning.  No major events overnight of this morning.  No complaints.  He denies pain, shortness of breath, GI or UTI symptoms.  He is not a great historian.  He is only oriented to self, "hospital" and day but not date, month or year.  Objective: Vitals:   07/02/21 2020 07/03/21 0009 07/03/21 0440 07/03/21 1320  BP: 133/69 124/73 133/85 120/87  Pulse: 82 95 88 95  Resp: 16 16 16 14   Temp: 99 F (37.2 C) 98.5 F (36.9 C) 98.2 F (36.8 C) (!) 97.3 F (36.3 C)  TempSrc: Oral Oral Oral Oral  SpO2: 94% 97% 98% 97%  Weight:      Height:        Examination:  GENERAL: No apparent distress.  Nontoxic. HEENT: MMM.  Vision and hearing grossly intact.  NECK: Supple.  No apparent JVD.  RESP:  No IWOB.  Fair aeration bilaterally. CVS: RRR.  Normal rate.  Heart sounds normal.  ABD/GI/GU: BS+. Abd soft, NTND.  MSK/EXT:  Moves  extremities. No apparent deformity. No edema.  SKIN: no apparent skin lesion or wound NEURO: Awake, alert and oriented appropriately.  No apparent focal neuro deficit. PSYCH: Calm. Normal affect.   Procedures:  None  Microbiology summarized: [id number] and influenza PCR nonreactive.  Assessment and Plan: * Non-small cell lung cancer metastatic to brain [facility])- (present on admission) Patient presents with fall, bilateral leg weakness, left> right.  MRI brain with increased brain mets, edema, and evidence of prior hemorrhage with progression in the right cerebellar lesion, left occipital lesion or right parietal lesions.  Started on Decadron.  Oncology consulted in ED. -Added patient's oncologist to treatment team -Discussed with Dr. Mickeal Skinner who will review imaging and formulate treatment plan -Continue Decadron -Continue Keppra -Appreciate input by palliative care   Fall at home, initial encounter Likely due to brain mets.  No significant injury -Fall precaution -PT/OT  Anemia associated with chemotherapy Recent Labs    01/25/21 0958 02/15/21 1016 03/08/21 1302 03/29/21 1350 04/19/21 1331 05/16/21 1213 06/07/21 1106 06/28/21 1338 07/02/21 1142 07/03/21 0917  HGB 10.0* 9.6* 9.8* 10.7* 10.8* 11.5* 11.5* 11.2* 12.6* 12.5*  Hemoglobin is stable.   Left leg weakness History of this in the past.  Likely from brain mets.  Previously improved with Decadron.  -Decadron as above  Leukopenia with lymphopenia WBC 3.1K.  Lymphocytes 200 -Continue monitoring. -Per oncology.  Diabetes mellitus type 2 in nonobese (HCC) A1c 6.7% on 05/01/2021. Recent Labs  Lab 07/02/21 2022 07/03/21 0010 07/03/21 0442 07/03/21 0737 07/03/21 1140  GLUCAP 143* 123* 161* 132* 217*  -Continue sliding scale insulin -Add basal insulin at 10 units daily -Discontinue home glipizide.  Continue holding Januvia and metformin   Hypertension BP stable off home antihypertensive meds. -Continue  holding home antihypertensive meds  Dyslipidemia associated with type 2 diabetes mellitus ([place])- (present on admission) - Continue home statin.  Goals of care, counseling/discussion Patient with metastatic brain cancer as above.  Poor long-term prognosis.  Still full code. -Palliative care consulted        DVT prophylaxis:  Place and maintain sequential compression device Start: 07/03/21 0759 SCDs Start: 07/02/21 1626  Code Status: Full code Family Communication: Updated patient's sister over the phone. Level of care: Med-Surg Status is: Inpatient Remains inpatient appropriate because: Leg weakness, fall at home and progressive brain metastasis from lung cancer    Final disposition: SNF    Consultants:  Oncology Neuro oncology Palliative medicine   Sch Meds:  Scheduled Meds:  dexamethasone (DECADRON) injection  4 mg Intravenous Q6H   insulin aspart  0-15 Units Subcutaneous TID WC   insulin glargine-yfgn  10 Units Subcutaneous Daily   pantoprazole  40 mg Oral Daily   Continuous Infusions:  sodium chloride 50 mL/hr at 07/03/21 1349   levETIRAcetam Stopped (07/03/21 0509)   PRN Meds:.acetaminophen **OR** acetaminophen, ondansetron **OR** ondansetron (ZOFRAN) IV, oxyCODONE  Antimicrobials: Anti-infectives (From admission, onward)    None        I have personally reviewed the following labs and images: CBC: Recent Labs  Lab 06/28/21 1338 07/02/21 1142 07/03/21 0917  WBC 5.0 3.3* 3.1*  NEUTROABS 4.1 3.1 2.7  HGB 11.2* 12.6* 12.5*  HCT 34.7* 38.8* 38.3*  MCV 98.9 98.7 98.2  PLT 225 244 230   BMP &GFR Recent Labs  Lab 06/28/21 1338 07/02/21 1142 07/03/21 0917  NA 139 141 140  K 4.1 3.6 4.0  CL 104 104 107  CO2 28 30 23   GLUCOSE 278* 114* 223*  BUN 11 12 16   CREATININE 0.82 0.77 0.83  CALCIUM 9.4 9.5 9.1  MG  --   --  1.9  PHOS  --   --  5.3*   Estimated Creatinine Clearance: 70.5 mL/min (by C-G formula based on SCr of 0.83 mg/dL). Liver  & Pancreas: Recent Labs  Lab 06/28/21 1338 07/02/21 1142 07/03/21 0917  AST 24 38  --   ALT 35 40  --   ALKPHOS 79 80  --   BILITOT 0.3 0.3  --   PROT 6.8 7.0  --   ALBUMIN 4.1 3.9 3.3*   No results for input(s): LIPASE, AMYLASE in the last 168 hours. No results for input(s): AMMONIA in the last 168 hours. Diabetic: No results for input(s): HGBA1C in the last 72 hours. Recent Labs  Lab 07/02/21 2022 07/03/21 0010 07/03/21 0442 07/03/21 0737 07/03/21 1140  GLUCAP 143* 123* 161* 132* 217*   Cardiac Enzymes: No results for input(s): CKTOTAL, CKMB, CKMBINDEX, TROPONINI in the last 168 hours. No results for input(s): PROBNP in the last 8760 hours. Coagulation Profile: No results for input(s): INR, PROTIME in the last 168 hours. Thyroid Function Tests: Recent Labs    07/02/21 1142  TSH 1.853   Lipid Profile: No results for input(s): CHOL, HDL, LDLCALC, TRIG, CHOLHDL, LDLDIRECT in the last 72 hours. Anemia Panel: No results for input(s): VITAMINB12, FOLATE, FERRITIN,  TIBC, IRON, RETICCTPCT in the last 72 hours. Urine analysis:    Component Value Date/Time   COLORURINE YELLOW 07/02/2021 Rebecca 07/02/2021 1123   LABSPEC 1.020 07/02/2021 1123   PHURINE 6.0 07/02/2021 1123   GLUCOSEU NEGATIVE 07/02/2021 1123   HGBUR NEGATIVE 07/02/2021 1123   BILIRUBINUR NEGATIVE 07/02/2021 1123   KETONESUR NEGATIVE 07/02/2021 1123   PROTEINUR 30 (A) 07/02/2021 1123   NITRITE NEGATIVE 07/02/2021 1123   LEUKOCYTESUR NEGATIVE 07/02/2021 1123   Sepsis Labs: Invalid input(s): PROCALCITONIN, [place]  Microbiology: Recent Results (from the past 240 hour(s))  Resp Panel by RT-PCR (Flu A&B, Covid) Nasopharyngeal Swab     Status: None   Collection Time: 07/02/21  3:43 PM   Specimen: Nasopharyngeal Swab; Nasopharyngeal(NP) swabs in vial transport medium  Result Value Ref Range Status   SARS Coronavirus 2 by RT PCR NEGATIVE NEGATIVE Final    Comment:  (NOTE) SARS-CoV-2 target nucleic acids are NOT DETECTED.  The SARS-CoV-2 RNA is generally detectable in upper respiratory specimens during the acute phase of infection. The lowest concentration of SARS-CoV-2 viral copies this assay can detect is 138 copies/mL. A negative result does not preclude SARS-Cov-2 infection and should not be used as the sole basis for treatment or other patient management decisions. A negative result may occur with  improper specimen collection/handling, submission of specimen other than nasopharyngeal swab, presence of viral mutation(s) within the areas targeted by this assay, and inadequate number of viral copies(<138 copies/mL). A negative result must be combined with clinical observations, patient history, and epidemiological information. The expected result is Negative.  Fact Sheet for Patients:  EntrepreneurPulse.com.au  Fact Sheet for Healthcare Providers:  IncredibleEmployment.be  This test is no t yet approved or cleared by the [place] FDA and  has been authorized for detection and/or diagnosis of SARS-CoV-2 by FDA under an Emergency Use Authorization (EUA). This EUA will remain  in effect (meaning this test can be used) for the duration of the COVID-19 declaration under Section 564(b)(1) of the Act, 21 U.S.C.section 360bbb-3(b)(1), unless the authorization is terminated  or revoked sooner.       Influenza A by PCR NEGATIVE NEGATIVE Final   Influenza B by PCR NEGATIVE NEGATIVE Final    Comment: (NOTE) The Xpert Xpress SARS-CoV-2/FLU/RSV plus assay is intended as an aid in the diagnosis of influenza from Nasopharyngeal swab specimens and should not be used as a sole basis for treatment. Nasal washings and aspirates are unacceptable for Xpert Xpress SARS-CoV-2/FLU/RSV testing.  Fact Sheet for Patients: EntrepreneurPulse.com.au  Fact Sheet for Healthcare  Providers: IncredibleEmployment.be  This test is not yet approved or cleared by the [place] FDA and has been authorized for detection and/or diagnosis of SARS-CoV-2 by FDA under an Emergency Use Authorization (EUA). This EUA will remain in effect (meaning this test can be used) for the duration of the COVID-19 declaration under Section 564(b)(1) of the Act, 21 U.S.C. section 360bbb-3(b)(1), unless the authorization is terminated or revoked.  Performed at [facility], [place] [address] [place], [place] [address]     Radiology Studies: MR Brain W and Wo Contrast  Result Date: 07/02/2021 CLINICAL DATA:  Metastatic lung cancer.  Assess treatment response. EXAM: MRI HEAD WITHOUT AND WITH CONTRAST TECHNIQUE: Multiplanar, multiecho pulse sequences of the brain and surrounding structures were obtained without and with intravenous contrast. CONTRAST:  7.62mL GADAVIST GADOBUTROL 1 MMOL/ML IV SOLN COMPARISON:  MRI head with contrast 05/13/2021 FINDINGS: Brain: 4 enhancing metastatic deposit in  the brain, all lesions show progression. Right posterior cerebellar lesion now measures 13.5 mm with progression. Associated hemorrhage shows mild progression. Mild edema surrounding the lesion with progression. Left occipital lesion shows progression now measuring 11.5 mm in diameter. Cystic necrosis. Progression of associated hemorrhage in the lesion. Progression of surrounding edema. Right medial parietal lobe lesion 22 mm in diameter with progression. Mild associated hemorrhage unchanged. Progression of surrounding vasogenic edema. Rim enhancing cystic lesion in the right parietal lobe measures 20.5 mm in diameter with progression. Progressive hemorrhage. Moderate vasogenic edema has progressed. No new lesions. Ventricle size normal.  No acute infarct.  No midline shift. Vascular: Normal arterial flow voids Skull and upper cervical spine: No metastatic disease to the  calvarium. Sinuses/Orbits: Mild mucosal edema paranasal sinuses. Negative orbit Other: None IMPRESSION: 4 metastatic deposits in the brain show progression since MRI 05/13/2021. All lesions show interval growth. Increased surrounding edema specially in the right parietal lobe. All lesions show evidence of prior hemorrhage with progression of hemorrhage in the right cerebellar lesion, left occipital lesion, and right parietal lesion. Electronically Signed   By: Franchot Gallo M.D.   On: 07/02/2021 15:26       Tuvia Woodrick [address] [place]  If 7PM-7AM, please contact night-coverage [email or web address] 07/03/2021, 2:44 PM

## 2021-07-04 DIAGNOSIS — W19XXXA Unspecified fall, initial encounter: Secondary | ICD-10-CM | POA: Diagnosis not present

## 2021-07-04 DIAGNOSIS — R4189 Other symptoms and signs involving cognitive functions and awareness: Secondary | ICD-10-CM

## 2021-07-04 DIAGNOSIS — C3490 Malignant neoplasm of unspecified part of unspecified bronchus or lung: Secondary | ICD-10-CM | POA: Diagnosis not present

## 2021-07-04 DIAGNOSIS — C7931 Secondary malignant neoplasm of brain: Secondary | ICD-10-CM | POA: Diagnosis not present

## 2021-07-04 DIAGNOSIS — Z7189 Other specified counseling: Secondary | ICD-10-CM | POA: Diagnosis not present

## 2021-07-04 DIAGNOSIS — D6481 Anemia due to antineoplastic chemotherapy: Secondary | ICD-10-CM | POA: Diagnosis not present

## 2021-07-04 LAB — GLUCOSE, CAPILLARY
Glucose-Capillary: 206 mg/dL — ABNORMAL HIGH (ref 70–99)
Glucose-Capillary: 247 mg/dL — ABNORMAL HIGH (ref 70–99)
Glucose-Capillary: 163 mg/dL — ABNORMAL HIGH (ref 70–99)
Glucose-Capillary: 209 mg/dL — ABNORMAL HIGH (ref 70–99)

## 2021-07-04 LAB — CBC
HCT: 34.1 % — ABNORMAL LOW (ref 39.0–52.0)
Hemoglobin: 11.3 g/dL — ABNORMAL LOW (ref 13.0–17.0)
MCH: 32.7 pg (ref 26.0–34.0)
MCHC: 33.1 g/dL (ref 30.0–36.0)
MCV: 98.6 fL (ref 80.0–100.0)
Platelets: 181 10*3/uL (ref 150–400)
RBC: 3.46 MIL/uL — ABNORMAL LOW (ref 4.22–5.81)
RDW: 12.4 % (ref 11.5–15.5)
WBC: 3.3 10*3/uL — ABNORMAL LOW (ref 4.0–10.5)
nRBC: 0 % (ref 0.0–0.2)

## 2021-07-04 MED ORDER — DEXAMETHASONE 4 MG PO TABS
4.0000 mg | ORAL_TABLET | Freq: Four times a day (QID) | ORAL | Status: DC
  Administered 2021-07-04 – 2021-07-05 (×3): 4 mg via ORAL
  Filled 2021-07-04 (×3): qty 1

## 2021-07-04 MED ORDER — LEVETIRACETAM 500 MG PO TABS
1000.0000 mg | ORAL_TABLET | Freq: Two times a day (BID) | ORAL | Status: DC
  Administered 2021-07-04 – 2021-07-15 (×22): 1000 mg via ORAL
  Filled 2021-07-04 (×22): qty 2

## 2021-07-04 MED ORDER — INSULIN GLARGINE-YFGN 100 UNIT/ML ~~LOC~~ SOLN
10.0000 [IU] | Freq: Two times a day (BID) | SUBCUTANEOUS | Status: DC
  Filled 2021-07-04: qty 0.1

## 2021-07-04 MED ORDER — INSULIN GLARGINE-YFGN 100 UNIT/ML ~~LOC~~ SOLN
10.0000 [IU] | Freq: Two times a day (BID) | SUBCUTANEOUS | Status: DC
  Administered 2021-07-04 – 2021-07-06 (×5): 10 [IU] via SUBCUTANEOUS
  Filled 2021-07-04 (×7): qty 0.1

## 2021-07-04 NOTE — Progress Notes (Addendum)
Inpatient Diabetes Program Recommendations ? ?AACE/ADA: New Consensus Statement on Inpatient Glycemic Control (2015) ? ?Target Ranges:  Prepandial:   less than 140 mg/dL ?     Peak postprandial:   less than 180 mg/dL (1-2 hours) ?     Critically ill patients:  140 - 180 mg/dL  ? ?Lab Results  ?Component Value Date  ? GLUCAP 206 (H) 07/04/2021  ? HGBA1C 6.7 (A) 05/01/2021  ? ? ?Review of Glycemic Control ? Latest Reference Range & Units 07/03/21 11:40 07/03/21 15:35 07/03/21 20:53 07/04/21 07:45  ?Glucose-Capillary 70 - 99 mg/dL 217 (H) 222 (H) 267 (H) 206 (H)  ? ?Diabetes history: DM 2 ?Outpatient Diabetes medications:  ?Glucotrol 5 mg bid ?Metformin 500 mg q AM ?Januvia 100 mg daily ?Decadron 2 mg daily ?Current orders for Inpatient glycemic control:  ?Novolog moderate tid with meals ?Decadron 4 mg every 6 hours ?Semglee 10 units daily ?Inpatient Diabetes Program Recommendations:   ?While in the hospital and on steroids, consider increasing Semglee to 14 units daily.  ? ?Thanks,  ?Adah Perl, RN, BC-ADM ?Inpatient Diabetes Coordinator ?Pager [phone]  (8a-5p) ? ? ? ?

## 2021-07-04 NOTE — Assessment & Plan Note (Addendum)
Seems to have underlying cognitive impairment.  He is only oriented times 3.  ?-Reorientation and delirium precaution ?-B 12:  1969 ?

## 2021-07-04 NOTE — Evaluation (Signed)
Occupational Therapy Evaluation Patient Details Name: Benjamin Willis. 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   Clinical Impression   Patient is currently requiring assistance with ADLs including moderate to maximum assist with Lower body ADLs, minimal assist with Upper body ADLs,  as well as  minimal assist with bed mobility and minimal assist with functional transfers to toilet (simulated as recliner) with a loss of balance and need of Min As due to LT knee buckle during pivot with RW.  Current level of function is below patient's typical baseline.  During this evaluation, patient was limited by generalized weakness, impaired activity tolerance, and cognitive deficits, all of which has the potential to impact patient's safety and independence during functional mobility, as well as performance for ADLs.  Patient lives in a  house with 3 stairs to enter with his sister who is able to provide 24/7 supervision and assistance and assists with some BADLs and all IADLs at baseline.  Patient demonstrates good rehab potential, and should benefit from continued skilled occupational therapy services while in acute care to maximize safety, independence and quality of life at home.  Continued occupational therapy services in a SNF setting prior to return home is recommended.  ?     Recommendations for follow up therapy are one component of a  multi-disciplinary discharge planning process, led by the attending physician.  Recommendations may be updated based on patient status, additional functional criteria and insurance authorization.   Follow Up Recommendations  Skilled nursing-short term rehab (<3 hours/day)    Assistance Recommended at Discharge Frequent or constant Supervision/Assistance  Patient can return home with the following A little help with bathing/dressing/bathroom;A little help with walking and/or transfers;Direct supervision/assist for medications management;Direct supervision/assist for financial management;Assist for transportation;Assistance with cooking/housework;Help with stairs or ramp for entrance    Functional Status Assessment  Patient has had a recent decline in their functional status and demonstrates the ability to make significant improvements in function in a reasonable and predictable amount of time.  Equipment Recommendations  None recommended by OT;Other (comment) (TBD)    Recommendations for Other Services       Precautions / Restrictions Precautions Precautions: Fall Precaution Comments: fell just prior to admission, family denies other falls in past 6 months Restrictions Weight Bearing Restrictions: No      Mobility Bed Mobility Overal bed mobility: Needs Assistance Bed Mobility: Supine to Sit     Supine to sit: Min assist     General bed mobility comments: Min As to fully advance LLE off EOB. Increased time/effort.    Transfers Overall transfer level: Needs assistance Equipment used: Rolling walker (2 wheels) Transfers: Sit to/from Stand, Bed to chair/wheelchair/BSC Sit to Stand: Min assist                  Balance Overall balance assessment: History of Falls, Needs assistance Sitting-balance support: Feet supported, No upper extremity supported Sitting balance-Leahy Scale: Fair     Standing balance support:  Bilateral upper extremity supported, During functional  activity Standing balance-Leahy Scale: Poor Standing balance comment: relies on BUE support, posterior lean initially, LT knee buckle                           ADL either performed or assessed with clinical judgement   ADL Overall ADL's : Needs assistance/impaired Eating/Feeding: Set up;Cueing for sequencing;Sitting   Grooming: Minimal assistance;Standing;Cueing for sequencing;Oral care;Wash/dry face Grooming Details (indicate cue type and reason): Pt with need of assistance and cues for sequencing tasks for oral care, and assist for placing toothpaste on brush. Tactile cues to bring toothbrush to mouth then pt able to brush. Upper Body Bathing: Set up;Supervision/ safety;Sitting;Cueing for sequencing   Lower Body Bathing: Moderate assistance;Sitting/lateral leans;Cueing for sequencing Lower Body Bathing Details (indicate cue type and reason): see toileting as well Upper Body Dressing : Minimal assistance;Sitting;Cueing for sequencing   Lower Body Dressing: Cueing for sequencing;Sitting/lateral leans;Maximal assistance Lower Body Dressing Details (indicate cue type and reason): Increased time/effort for pt to doff and don socks while seated EOB with need of Max assist. Toilet Transfer: Stand-pivot;Cueing for sequencing;Minimal assistance;Cueing for safety;Rolling walker (2 wheels) Toilet Transfer Details (indicate cue type and reason): To recliner. Pt denied need for toilet tranfer. Pt with LT knee buckle and need of Min As to correct balance with use of RW. Step by step cues to pivot to recliner. Toileting- Clothing Manipulation and Hygiene: Maximal assistance;Sit to/from stand;Cueing for sequencing Toileting - Clothing Manipulation Details (indicate cue type and reason): Pt stood at sink and bathed anterior peri area with Min As and posterior peri area with Total As.     Functional mobility during ADLs: Minimal assistance;Min guard;Cueing for safety;Cueing for  sequencing;Rolling walker (2 wheels)       Vision   Vision Assessment?: No apparent visual deficits     Perception Perception Perception: Not tested   Praxis Praxis Praxis: Impaired Praxis Impairment Details: Ideomotor;Motor planning;Initiation    Pertinent Vitals/Pain Pain Assessment Pain Assessment: No/denies pain     Hand Dominance Right   Extremity/Trunk Assessment Upper Extremity Assessment Upper Extremity Assessment: LUE deficits/detail;RUE deficits/detail RUE Deficits / Details: Shoulder ROM restricted to ~100 degrees. MMT: WFL LUE Deficits / Details: Generalized weakness, esp proximally.   Lower Extremity Assessment Lower Extremity Assessment: LLE deficits/detail LLE Deficits / Details: Reports intermittent numbness down LLE       Communication Communication Communication: Receptive difficulties;Expressive difficulties   Cognition Arousal/Alertness: Awake/alert Behavior During Therapy: WFL for tasks assessed/performed, Impulsive Overall Cognitive Status: History of cognitive impairments - at baseline                                 General Comments: increased processing time, difficulty following some commands with manual muscle testing and ADLs, oriented to self, "hospital" "[place]", month with increased time and stated year As 2022. Oriented to general situation stating "I fell".  Follows 1-step commands well, but not 2-step.     General Comments       Exercises     Shoulder Instructions      Home Living Family/patient expects to be discharged to:: Private residence Living Arrangements: Other relatives (Pt lives with Sister) Available Help at Discharge: Family;Available 24 hours/day Type of Home: House Home Access: Stairs to enter [employer] of Steps: 3 Entrance Stairs-Rails: Can reach both;Left;Right Home Layout: Two level;Able to live on main level with  bedroom/bathroom     Bathroom Shower/Tub: Tub/shower unit  (Pt stated Walk in shower, but chart tub/shower. Pt is confused at times.)   Bathroom Toilet: Standard     Home Equipment: Conservation officer, nature (2 wheels);BSC/3in1;Grab bars - tub/shower;Cane - single point;Shower seat;Hand held shower head   Additional Comments: Per chart review sister plans to bring patient home with her and her husband. Family not present at time of eval, pt reports he is in agreement to go stay with his sister while he regains his strength and independence.      Prior Functioning/Environment Prior Level of Function : Needs assist  Cognitive Assist : ADLs (cognitive)   ADLs (Cognitive): Intermittent cues Physical Assist : ADLs (physical)   ADLs (physical): Dressing;Toileting;Bathing;IADLs Mobility Comments: walked with RW without assistance (including walking outside) ADLs Comments: Pt initially stated that he was independent with BADLs but while testing LE dressing, pt struggling with doffing socks and reported that his sister helps with this.  Pt did endorse full assistance with all IADLs and driving.        OT Problem List: Decreased strength;Decreased coordination;Decreased range of motion;Decreased cognition;Impaired sensation;Decreased safety awareness;Decreased activity tolerance;Impaired balance (sitting and/or standing);Decreased knowledge of use of DME or AE;Decreased knowledge of precautions      OT Treatment/Interventions: Self-care/ADL training;Therapeutic exercise;Therapeutic activities;Neuromuscular education;Cognitive remediation/compensation;Energy conservation;Patient/family education;DME and/or AE instruction;Balance training    OT Goals(Current goals can be found in the care plan section) Acute Rehab OT Goals Patient Stated Goal: To get stronger before home. OT Goal Formulation: With patient Time For Goal Achievement: 07/18/21 Potential to Achieve Goals: Good ADL Goals Pt Will Perform Grooming: standing;with supervision;with set-up Pt Will Perform  Lower Body Dressing: with set-up;with min guard assist;sitting/lateral leans;sit to/from stand Pt Will Transfer to Toilet: with supervision;ambulating Pt Will Perform Toileting - Clothing Manipulation and hygiene: with min guard assist;sitting/lateral leans;sit to/from stand;with caregiver independent in assisting Pt/caregiver will Perform Home Exercise Program: Left upper extremity;Both right and left upper extremity;With theraband;With Supervision;With minimal assist;Increased strength;Increased ROM Additional ADL Goal #1: Pt will demonstrate improved mentation by scoring <5/10 on short blessed test and answering 4/4 safety questions from the [facility] correctly:   1. What do you do for yourself if you are sick with a cold.    2. What do you do if you burn yourself and the wound becomes infected.    3. What do you do if you experience severe chest pain and shortness of breath?   4. What number do you call in an emergency?  OT Frequency: Min 2X/week    Co-evaluation              AM-PAC OT "6 Clicks" Daily Activity     Outcome Measure Help from another person eating meals?: A Little Help from another person taking care of personal grooming?: A Little Help from another person toileting, which includes using toliet, bedpan, or urinal?: A Lot Help from another person bathing (including washing, rinsing, drying)?: A Lot Help from another person to put on and taking off regular upper body clothing?: A Little Help from another person to put on and taking off regular lower body clothing?: A Lot 6 Click Score: 15   End of Session Equipment Utilized During Treatment: Gait belt;Rolling walker (2 wheels) Nurse Communication: Other (comment) (CNA assisted with pt at sink. Hand off to Nursing in room for meds as OT leaving)  Activity Tolerance: Patient tolerated treatment well Patient left: in chair;with call bell/phone within reach;with chair alarm set;with nursing/sitter  in room  OT Visit Diagnosis:  Unsteadiness on feet (R26.81);History of falling (Z91.81);Muscle weakness (generalized) (M62.81);Other symptoms and signs involving cognitive function;Cognitive communication deficit (R41.841) Symptoms and signs involving cognitive functions: Other cerebrovascular disease                Time: 0935-1007 OT Time Calculation (min): 32 min Charges:  OT General Charges $OT Visit: 1 Visit OT Evaluation $OT Eval Moderate Complexity: 1 Mod OT Treatments $Self Care/Home Management : 8-22 mins  Anderson Malta, OT Acute Rehab Services Office: [phone] 07/04/2021  Julien Girt 07/04/2021, 10:24 AM

## 2021-07-04 NOTE — Progress Notes (Signed)
PROGRESS NOTE  Benjamin Willis. BWL:[id number] DOB: July 02, 1945   PCP: Horald Pollen, MD  Patient is from: Home.  DOA: 07/02/2021 LOS: 2  Chief complaints:  Chief Complaint  Patient presents with   Weakness     Brief Narrative / Interim history: 76 year old M with PMH of NSCLC of right lung with mets to brain s/p SBRT but did not wish to undergo SRS followed by Dr. Lorenso Courier presenting with generalized weakness, BLE weakness, tremors and fall when he tried to get out of the bed, and admitted for fall at home, left leg weakness and generalized weakness.  Vitals are stable.  Tmax 99.4.  Labs with stable CMP, anemia and leukopenia.  UA not suggestive for UTI.  CT head and MRI brain with for metastatic deposits in brain (progressed since his MRI on 05/13/2021) increased surrounding edema in the right parietal lobe with evidence of prior hemorrhage with progression of hemorrhage into right cerebellar lesion, left occipital lesion and right parietal lesion.  Started on steroid.  Reportedly oncology consulted from ED    Subjective: Seen and examined earlier this morning.  No major events overnight of this morning.  No complaints but not a reliable historian.  He denies pain, shortness of breath, GI or UTI symptoms.  He is only oriented to self and "hospital".  Not oriented to time or specific place  Objective: Vitals:   07/03/21 1320 07/03/21 2056 07/04/21 0447 07/04/21 1242  BP: 120/87 136/76 (!) 130/91 (!) 141/74  Pulse: 95 88 84 83  Resp: 14 14 14 15   Temp: (!) 97.3 F (36.3 C) 98.4 F (36.9 C) 98.8 F (37.1 C) 98.1 F (36.7 C)  TempSrc: Oral Oral Oral Oral  SpO2: 97% 97% 94% 95%  Weight:      Height:        Examination:  GENERAL: No apparent distress.  Nontoxic. HEENT: MMM.  Vision and hearing grossly intact.  NECK: Supple.  No apparent JVD.  RESP:  No IWOB.  Fair aeration bilaterally. CVS:  RRR. Heart sounds normal.  ABD/GI/GU: BS+. Abd soft, NTND.  MSK/EXT:  Moves  extremities. No apparent deformity. No edema.  SKIN: no apparent skin lesion or wound NEURO: Awake and alert. Oriented to self and "hospital".  No apparent focal neuro deficit.  Motor 4/5 in BLE. PSYCH: Calm. Normal affect.   Procedures:  None  Microbiology summarized: [id number] and influenza PCR nonreactive.  Assessment and Plan: * Non-small cell lung cancer metastatic to brain [facility]) Patient presents with fall, bilateral leg weakness, left> right.  MRI brain with increased brain mets, edema, and evidence of prior hemorrhage with progression in the right cerebellar lesion, left occipital lesion or right parietal lesions.  Started on Decadron.  Oncology consulted in ED. -Oncology following, and discussing treatment option with neurooncology and radiation oncology -Continue Decadron 4 mg every 6 hours -Continue Keppra -Appreciate input by palliative care   Cognitive impairment Seems to have underlying cognitive impairment.  He is only oriented to self, "hospital" but not the name of the hospital, city or time. -Reorientation and delirium precaution  Left leg weakness History of this in the past.  Likely from brain mets.  Previously improved with Decadron.  -Decadron as above  Fall at home, initial encounter Likely due to brain mets.  No significant injury -Fall precaution -PT/OT  Anemia associated with chemotherapy Recent Labs    02/15/21 1016 03/08/21 1302 03/29/21 1350 04/19/21 1331 05/16/21 1213 06/07/21 1106 06/28/21 1338 07/02/21 1142 07/03/21 0917 07/04/21  1412  HGB 9.6* 9.8* 10.7* 10.8* 11.5* 11.5* 11.2* 12.6* 12.5* 11.3*  Hemoglobin seems to be at baseline. -Continue monitoring   Leukopenia with lymphocytopenia Stable. -Continue monitoring. -Per oncology.  Diabetes mellitus type 2 in nonobese (HCC) A1c 6.7% on 05/01/2021. Recent Labs  Lab 07/03/21 1535 07/03/21 2053 07/04/21 0745 07/04/21 1143 07/04/21 1645  GLUCAP 222* 267* 206* 247* 163*   -Continue sliding scale insulin -Increase Semglee from 10 units daily to 10 units twice daily -Discontinue home glipizide.  Continue holding Januvia and metformin   Hypertension BP stable off home antihypertensive meds. -Continue holding home antihypertensive meds  Dyslipidemia associated with type 2 diabetes mellitus ([place]) - Continue home statin.  Goals of care, counseling/discussion Patient with metastatic brain cancer as above.  Poor long-term prognosis.  -Palliative care following.  Remains full code        DVT prophylaxis:  Place and maintain sequential compression device Start: 07/03/21 0759 SCDs Start: 07/02/21 1626  Code Status: Full code Family Communication: Updated patient's sister over the phone on 3/1. Level of care: Med-Surg Status is: Inpatient Remains inpatient appropriate because: Leg weakness, fall at home and progressive brain metastasis from lung cancer    Final disposition: SNF    Consultants:  Oncology Neuro oncology Radiation oncology Palliative medicine   Sch Meds:  Scheduled Meds:  dexamethasone  4 mg Oral Q6H   insulin aspart  0-15 Units Subcutaneous TID WC   insulin glargine-yfgn  10 Units Subcutaneous BID   levETIRAcetam  1,000 mg Oral BID   pantoprazole  40 mg Oral Daily   Continuous Infusions:   PRN Meds:.acetaminophen **OR** acetaminophen, ondansetron **OR** ondansetron (ZOFRAN) IV, oxyCODONE  Antimicrobials: Anti-infectives (From admission, onward)    None        I have personally reviewed the following labs and images: CBC: Recent Labs  Lab 06/28/21 1338 07/02/21 1142 07/03/21 0917 07/04/21 1412  WBC 5.0 3.3* 3.1* 3.3*  NEUTROABS 4.1 3.1 2.7  --   HGB 11.2* 12.6* 12.5* 11.3*  HCT 34.7* 38.8* 38.3* 34.1*  MCV 98.9 98.7 98.2 98.6  PLT 225 244 230 181   BMP &GFR Recent Labs  Lab 06/28/21 1338 07/02/21 1142 07/03/21 0917  NA 139 141 140  K 4.1 3.6 4.0  CL 104 104 107  CO2 28 30 23   GLUCOSE 278*  114* 223*  BUN 11 12 16   CREATININE 0.82 0.77 0.83  CALCIUM 9.4 9.5 9.1  MG  --   --  1.9  PHOS  --   --  5.3*   Estimated Creatinine Clearance: 70.5 mL/min (by C-G formula based on SCr of 0.83 mg/dL). Liver & Pancreas: Recent Labs  Lab 06/28/21 1338 07/02/21 1142 07/03/21 0917  AST 24 38  --   ALT 35 40  --   ALKPHOS 79 80  --   BILITOT 0.3 0.3  --   PROT 6.8 7.0  --   ALBUMIN 4.1 3.9 3.3*   No results for input(s): LIPASE, AMYLASE in the last 168 hours. No results for input(s): AMMONIA in the last 168 hours. Diabetic: No results for input(s): HGBA1C in the last 72 hours. Recent Labs  Lab 07/03/21 1535 07/03/21 2053 07/04/21 0745 07/04/21 1143 07/04/21 1645  GLUCAP 222* 267* 206* 247* 163*   Cardiac Enzymes: No results for input(s): CKTOTAL, CKMB, CKMBINDEX, TROPONINI in the last 168 hours. No results for input(s): PROBNP in the last 8760 hours. Coagulation Profile: No results for input(s): INR, PROTIME in the last 168 hours.  Thyroid Function Tests: Recent Labs    07/02/21 1142  TSH 1.853   Lipid Profile: No results for input(s): CHOL, HDL, LDLCALC, TRIG, CHOLHDL, LDLDIRECT in the last 72 hours. Anemia Panel: No results for input(s): VITAMINB12, FOLATE, FERRITIN, TIBC, IRON, RETICCTPCT in the last 72 hours. Urine analysis:    Component Value Date/Time   COLORURINE YELLOW 07/02/2021 [place] 07/02/2021 1123   LABSPEC 1.020 07/02/2021 1123   PHURINE 6.0 07/02/2021 1123   GLUCOSEU NEGATIVE 07/02/2021 1123   HGBUR NEGATIVE 07/02/2021 1123   BILIRUBINUR NEGATIVE 07/02/2021 1123   KETONESUR NEGATIVE 07/02/2021 1123   PROTEINUR 30 (A) 07/02/2021 1123   NITRITE NEGATIVE 07/02/2021 1123   LEUKOCYTESUR NEGATIVE 07/02/2021 1123   Sepsis Labs: Invalid input(s): PROCALCITONIN, [place]  Microbiology: Recent Results (from the past 240 hour(s))  Resp Panel by RT-PCR (Flu A&B, Covid) Nasopharyngeal Swab     Status: None   Collection Time:  07/02/21  3:43 PM   Specimen: Nasopharyngeal Swab; Nasopharyngeal(NP) swabs in vial transport medium  Result Value Ref Range Status   SARS Coronavirus 2 by RT PCR NEGATIVE NEGATIVE Final    Comment: (NOTE) SARS-CoV-2 target nucleic acids are NOT DETECTED.  The SARS-CoV-2 RNA is generally detectable in upper respiratory specimens during the acute phase of infection. The lowest concentration of SARS-CoV-2 viral copies this assay can detect is 138 copies/mL. A negative result does not preclude SARS-Cov-2 infection and should not be used as the sole basis for treatment or other patient management decisions. A negative result may occur with  improper specimen collection/handling, submission of specimen other than nasopharyngeal swab, presence of viral mutation(s) within the areas targeted by this assay, and inadequate number of viral copies(<138 copies/mL). A negative result must be combined with clinical observations, patient history, and epidemiological information. The expected result is Negative.  Fact Sheet for Patients:  EntrepreneurPulse.com.au  Fact Sheet for Healthcare Providers:  IncredibleEmployment.be  This test is no t yet approved or cleared by the [place] FDA and  has been authorized for detection and/or diagnosis of SARS-CoV-2 by FDA under an Emergency Use Authorization (EUA). This EUA will remain  in effect (meaning this test can be used) for the duration of the COVID-19 declaration under Section 564(b)(1) of the Act, 21 U.S.C.section 360bbb-3(b)(1), unless the authorization is terminated  or revoked sooner.       Influenza A by PCR NEGATIVE NEGATIVE Final   Influenza B by PCR NEGATIVE NEGATIVE Final    Comment: (NOTE) The Xpert Xpress SARS-CoV-2/FLU/RSV plus assay is intended as an aid in the diagnosis of influenza from Nasopharyngeal swab specimens and should not be used as a sole basis for treatment. Nasal washings  and aspirates are unacceptable for Xpert Xpress SARS-CoV-2/FLU/RSV testing.  Fact Sheet for Patients: EntrepreneurPulse.com.au  Fact Sheet for Healthcare Providers: IncredibleEmployment.be  This test is not yet approved or cleared by the [place] FDA and has been authorized for detection and/or diagnosis of SARS-CoV-2 by FDA under an Emergency Use Authorization (EUA). This EUA will remain in effect (meaning this test can be used) for the duration of the COVID-19 declaration under Section 564(b)(1) of the Act, 21 U.S.C. section 360bbb-3(b)(1), unless the authorization is terminated or revoked.  Performed at [facility], [place] [address] [place], [place] [address]     Radiology Studies: No results found.     Jeremian Whitby T. [place]  If 7PM-7AM, please contact night-coverage [email or web address] 07/04/2021, 5:21 PM

## 2021-07-04 NOTE — Progress Notes (Signed)
? ?                                                                                                                                                     ?                                                   ?Daily Progress Note  ? ?Patient Name: Benjamin Willis.       Date: 07/04/2021 ?DOB: 01/08/1946  Age: 76 y.o. MRN#: [id number] ?Attending Physician: Mercy Riding, MD ?Primary Care Physician: Horald Pollen, MD ?Admit Date: 07/02/2021 ? ?Reason for Consultation/Follow-up: Establishing goals of care ? ?Subjective: ?Patient is awake alert resting in bed.  He is tolerating his breakfast.  He states that he feels better than when he came in, he still complains of left-sided weakness.  He recalls meeting with Dr. Lorenso Courier. ? ?Length of Stay: 2 ? ?Current Medications: ?Scheduled Meds:  ? dexamethasone (DECADRON) injection  4 mg Intravenous Q6H  ? insulin aspart  0-15 Units Subcutaneous TID WC  ? insulin glargine-yfgn  10 Units Subcutaneous BID  ? pantoprazole  40 mg Oral Daily  ? ? ?Continuous Infusions: ? levETIRAcetam 1,000 mg (07/04/21 0310)  ? ? ?PRN Meds: ?acetaminophen **OR** acetaminophen, ondansetron **OR** ondansetron (ZOFRAN) IV, oxyCODONE ? ?Physical Exam         ?Awake alert oriented ?Appears with generalized weakness ?Appears chronically ill ?Has left-sided weakness ?S1-S2 ?Abdomen is not distended ?Does not have edema ?Patient has some word finding difficulty, speaks slowly however is able to answer all questions appropriately and converses normally. ? ?Vital Signs: BP (!) 130/91 (BP Location: Right Arm)   Pulse 84   Temp 98.8 ?F (37.1 ?C) (Oral)   Resp 14   Ht 5\' 6"  (1.676 m)   Wt 71.5 kg   SpO2 94%   BMI 25.44 kg/m?  ?SpO2: SpO2: 94 % ?O2 Device: O2 Device: Room Air ?O2 Flow Rate:   ? ?Intake/output summary:  ?Intake/Output Summary (Last 24 hours) at 07/04/2021 1125 ?Last data filed at 07/04/2021 1031 ?Gross per 24 hour  ?Intake 932.88 ml  ?Output 575 ml  ?Net 357.88 ml  ? ?LBM: Last BM Date :  07/03/21 ?Baseline Weight: Weight: 71.5 kg ?Most recent weight: Weight: 71.5 kg ? ?     ?Palliative Assessment/Data: ? ? ? ? ? ?Patient Active Problem List  ? Diagnosis Date Noted  ? Fall at home, initial encounter 07/03/2021  ? Left leg weakness 07/03/2021  ? Leukopenia with lymphopenia 07/02/2021  ? Anemia associated with chemotherapy 07/02/2021  ? Non-small cell lung cancer metastatic to brain [facility]) 04/01/2021  ? Port-A-Cath in place 12/14/2020  ?  Brain metastases ([place])   ? Palliative care by specialist   ? Metastatic lung cancer (metastasis from lung to other site) [facility]) 10/16/2020  ? Diabetes mellitus type 2 in nonobese ([place]) 10/16/2020  ? Glaucoma 10/16/2020  ? Atherosclerosis of aorta ([place]) 05/31/2020  ? Primary cancer of right upper lobe of lung ([place]) 02/07/2019  ? Lung nodule 02/02/2019  ? Hypertension 05/24/2018  ? Dyslipidemia 05/24/2018  ? Dyslipidemia associated with type 2 diabetes mellitus ([place]) 06/09/2017  ? Goals of care, counseling/discussion 01/01/2017  ? ? ?Palliative Care Assessment & Plan  ? ?Patient Profile: ? ? ?Assessment: ?76 year old gentleman with adenocarcinoma of the lung, metastasis to the brain.  Patient presented with weakness and fall and was found to have worsening intracranial lesions on brain imaging.  He follows with Dr. Lorenso Courier with oncology.  MRI of the brain showed metastatic deposits with progression since his last MRI.  Palliative care consulted for ongoing goals of care discussions.  Patient stated that he has had good quality of life, reasonable functional status and has been continuing with cancer directed treatments and wishes to continue the same for now.  Palliative following to monitor hospital course and overall disease trajectory of illness so as to guide further goals of care discussions. ? ?Recommendations/Plan: ?Patient to undergo evaluation by radiation oncology and possibly also neurosurgery. ?Rediscussed goals of care.  Wishes for full code and full scope  care.  He remains hopeful for continuing with cancer directed treatments. ?PT has recommended skilled nursing facility.  Recommend palliative services following patient at SNF for rehab.  ? ?Goals of Care and Additional Recommendations: ?Limitations on Scope of Treatment: Full Scope Treatment ? ?Code Status: ? ?  ?Code Status Orders  ?(From admission, onward)  ?  ? ? ?  ? ?  Start     Ordered  ? 07/02/21 1626  Full code  Continuous       ? 07/02/21 1627  ? ?  ?  ? ?  ? ?Code Status History   ? ? Date Active Date Inactive Code Status Order ID Comments User Context  ? 01/07/2021 1618 01/10/2021 2013 Full Code [id number]  Jonnie Finner, DO Inpatient  ? 10/16/2020 1115 10/23/2020 2054 Full Code [id number]  Karmen Bongo, MD ED  ? ?  ? ? ?Prognosis: ? Unable to determine ? ?Discharge Planning: ?[facility] for rehab with Palliative care service follow-up ? ?Care plan was discussed with patient, attempted to call sister unable to reach at both home and cell phone number.   ? ?Thank you for allowing the Palliative Medicine Team to assist in the care of this patient. ? ? ?Time In: 9 Time Out: 9.25 Total Time 25 Prolonged Time Billed  no   ? ?   ?Greater than 50%  of this time was spent counseling and coordinating care related to the above assessment and plan. ? ?Benjamin Chance, MD ? ?Please contact Palliative Medicine Team phone at [phone] for questions and concerns.  ? ? ? ? ? ?

## 2021-07-05 DIAGNOSIS — D6481 Anemia due to antineoplastic chemotherapy: Secondary | ICD-10-CM | POA: Diagnosis not present

## 2021-07-05 DIAGNOSIS — C3490 Malignant neoplasm of unspecified part of unspecified bronchus or lung: Secondary | ICD-10-CM

## 2021-07-05 DIAGNOSIS — Z7189 Other specified counseling: Secondary | ICD-10-CM | POA: Diagnosis not present

## 2021-07-05 DIAGNOSIS — C7931 Secondary malignant neoplasm of brain: Secondary | ICD-10-CM

## 2021-07-05 DIAGNOSIS — W19XXXA Unspecified fall, initial encounter: Secondary | ICD-10-CM | POA: Diagnosis not present

## 2021-07-05 LAB — RENAL FUNCTION PANEL
Albumin: 3.2 g/dL — ABNORMAL LOW (ref 3.5–5.0)
Anion gap: 6 (ref 5–15)
BUN: 20 mg/dL (ref 8–23)
CO2: 27 mmol/L (ref 22–32)
Calcium: 8.8 mg/dL — ABNORMAL LOW (ref 8.9–10.3)
Chloride: 103 mmol/L (ref 98–111)
Creatinine, Ser: 0.72 mg/dL (ref 0.61–1.24)
GFR, Estimated: >60 mL/min (ref >60)
Glucose, Bld: 194 mg/dL — ABNORMAL HIGH (ref 70–99)
Phosphorus: 4 mg/dL (ref 2.5–4.6)
Potassium: 4.2 mmol/L (ref 3.5–5.1)
Sodium: 136 mmol/L (ref 135–145)

## 2021-07-05 LAB — CBC WITH DIFFERENTIAL/PLATELET
Abs Immature Granulocytes: 0.03 10*3/uL (ref 0.00–0.07)
Basophils Absolute: 0 10*3/uL (ref 0.0–0.1)
Basophils Relative: 1 %
Eosinophils Absolute: 0 10*3/uL (ref 0.0–0.5)
Eosinophils Relative: 0 %
HCT: 34.5 % — ABNORMAL LOW (ref 39.0–52.0)
Hemoglobin: 11.2 g/dL — ABNORMAL LOW (ref 13.0–17.0)
Immature Granulocytes: 1 %
Lymphocytes Relative: 27 %
Lymphs Abs: 0.7 10*3/uL (ref 0.7–4.0)
MCH: 32.2 pg (ref 26.0–34.0)
MCHC: 32.5 g/dL (ref 30.0–36.0)
MCV: 99.1 fL (ref 80.0–100.0)
Monocytes Absolute: 0.5 10*3/uL (ref 0.1–1.0)
Monocytes Relative: 20 %
Neutro Abs: 1.4 10*3/uL — ABNORMAL LOW (ref 1.7–7.7)
Neutrophils Relative %: 51 %
Platelets: 164 10*3/uL (ref 150–400)
RBC: 3.48 MIL/uL — ABNORMAL LOW (ref 4.22–5.81)
RDW: 12.4 % (ref 11.5–15.5)
WBC: 2.7 10*3/uL — ABNORMAL LOW (ref 4.0–10.5)
nRBC: 0.7 % — ABNORMAL HIGH (ref 0.0–0.2)

## 2021-07-05 LAB — HEMOGLOBIN A1C
Hgb A1c MFr Bld: 8.3 % — ABNORMAL HIGH (ref 4.8–5.6)
Mean Plasma Glucose: 191.51 mg/dL

## 2021-07-05 LAB — GLUCOSE, CAPILLARY
Glucose-Capillary: 187 mg/dL — ABNORMAL HIGH (ref 70–99)
Glucose-Capillary: 190 mg/dL — ABNORMAL HIGH (ref 70–99)
Glucose-Capillary: 219 mg/dL — ABNORMAL HIGH (ref 70–99)
Glucose-Capillary: 320 mg/dL — ABNORMAL HIGH (ref 70–99)

## 2021-07-05 LAB — MAGNESIUM: Magnesium: 2.2 mg/dL (ref 1.7–2.4)

## 2021-07-05 MED ORDER — INSULIN ASPART 100 UNIT/ML IJ SOLN
0.0000 [IU] | Freq: Three times a day (TID) | INTRAMUSCULAR | Status: DC
  Administered 2021-07-05: 5 [IU] via SUBCUTANEOUS
  Administered 2021-07-06: 2 [IU] via SUBCUTANEOUS
  Administered 2021-07-06: 8 [IU] via SUBCUTANEOUS
  Administered 2021-07-06 – 2021-07-07 (×3): 3 [IU] via SUBCUTANEOUS
  Administered 2021-07-07 – 2021-07-08 (×2): 5 [IU] via SUBCUTANEOUS
  Administered 2021-07-08 (×2): 3 [IU] via SUBCUTANEOUS
  Administered 2021-07-09 (×2): 5 [IU] via SUBCUTANEOUS
  Administered 2021-07-10: 2 [IU] via SUBCUTANEOUS
  Administered 2021-07-10 – 2021-07-11 (×2): 3 [IU] via SUBCUTANEOUS
  Administered 2021-07-11: 18:00:00 11 [IU] via SUBCUTANEOUS
  Administered 2021-07-12: 3 [IU] via SUBCUTANEOUS
  Administered 2021-07-12: 2 [IU] via SUBCUTANEOUS
  Administered 2021-07-13: 3 [IU] via SUBCUTANEOUS
  Administered 2021-07-13: 8 [IU] via SUBCUTANEOUS
  Administered 2021-07-14: 2 [IU] via SUBCUTANEOUS
  Administered 2021-07-14: 8 [IU] via SUBCUTANEOUS
  Administered 2021-07-14: 5 [IU] via SUBCUTANEOUS

## 2021-07-05 MED ORDER — FOLIC ACID 1 MG PO TABS
1.0000 mg | ORAL_TABLET | Freq: Every day | ORAL | Status: DC
  Administered 2021-07-05 – 2021-07-15 (×11): 1 mg via ORAL
  Filled 2021-07-05 (×11): qty 1

## 2021-07-05 MED ORDER — ROSUVASTATIN CALCIUM 20 MG PO TABS
20.0000 mg | ORAL_TABLET | Freq: Every day | ORAL | Status: DC
  Administered 2021-07-05 – 2021-07-15 (×11): 20 mg via ORAL
  Filled 2021-07-05 (×10): qty 1

## 2021-07-05 MED ORDER — LATANOPROST 0.005 % OP SOLN
1.0000 [drp] | Freq: Every day | OPHTHALMIC | Status: DC
  Administered 2021-07-05 – 2021-07-14 (×10): 1 [drp] via OPHTHALMIC
  Filled 2021-07-05: qty 2.5

## 2021-07-05 MED ORDER — DEXAMETHASONE 4 MG PO TABS
4.0000 mg | ORAL_TABLET | Freq: Three times a day (TID) | ORAL | Status: DC
  Administered 2021-07-05 – 2021-07-07 (×7): 4 mg via ORAL
  Filled 2021-07-05 (×7): qty 1

## 2021-07-05 MED ORDER — INSULIN ASPART 100 UNIT/ML IJ SOLN
3.0000 [IU] | Freq: Three times a day (TID) | INTRAMUSCULAR | Status: DC
  Administered 2021-07-05 – 2021-07-06 (×2): 3 [IU] via SUBCUTANEOUS

## 2021-07-05 NOTE — Progress Notes (Signed)
PROGRESS NOTE  Benjamin Willis. PFX:[id number] DOB: 1945-11-08   PCP: Horald Pollen, MD  Patient is from: Home.  DOA: 07/02/2021 LOS: 3  Chief complaints:  Chief Complaint  Patient presents with   Weakness     Brief Narrative / Interim history: 76 year old M with PMH of NSCLC of right lung with mets to brain s/p SBRT but did not wish to undergo SRS followed by Dr. Lorenso Courier presenting with generalized weakness, BLE weakness, tremors and fall when he tried to get out of the bed, and admitted for fall at home, left leg weakness and generalized weakness.  Vitals are stable.  Tmax 99.4.  Labs with stable CMP, anemia and leukopenia.  UA not suggestive for UTI.  CT head and MRI brain with for metastatic deposits in brain (progressed since his MRI on 05/13/2021) increased surrounding edema in the right parietal lobe with evidence of prior hemorrhage with progression of hemorrhage into right cerebellar lesion, left occipital lesion and right parietal lesion.  Started on steroid.   Oncology, neurooncology and radiation oncology involved.  Ongoing discussion about treatment plan    Subjective: Seen and examined earlier this morning.  No major events overnight of this morning.  No complaints but not a reliable historian.  He is only oriented to self and "hospital".  He responds no to pain, shortness of breath, nausea, vomiting or abdominal pain.  Objective: Vitals:   07/04/21 1242 07/04/21 2100 07/05/21 0440 07/05/21 1305  BP: (!) 141/74 132/66 127/76 127/69  Pulse: 83 77 70 72  Resp: 15 18 16    Temp: 98.1 F (36.7 C) 98 F (36.7 C) (!) 97.4 F (36.3 C) 98.2 F (36.8 C)  TempSrc: Oral Oral Oral Oral  SpO2: 95% 97% 97% 97%  Weight:      Height:        Examination: GENERAL: No apparent distress.  Nontoxic. HEENT: MMM.  Vision and hearing grossly intact.  NECK: Supple.  No apparent JVD.  RESP:  No IWOB.  Fair aeration bilaterally. CVS:  RRR. Heart sounds normal.  ABD/GI/GU: BS+.  Abd soft, NTND.  MSK/EXT:  Moves extremities. No apparent deformity. No edema.  SKIN: no apparent skin lesion or wound NEURO: Awake and alert. Oriented to self and "hospital".  Follows commands.  No apparent focal neuro deficit.  Motor 4/5 in BLE. PSYCH: Calm. Normal affect.   Procedures:  None  Microbiology summarized: [id number] and influenza PCR nonreactive.  Assessment and Plan: * Non-small cell lung cancer metastatic to brain [facility]) Patient presents with fall, bilateral leg weakness, left> right.  MRI brain with increased brain mets, edema, and evidence of prior hemorrhage with progression in the right cerebellar lesion, left occipital lesion or right parietal lesions.  Started on Decadron.  Motor 4/5 in BLE on my exam. -Oncology, neurooncology and radiation oncology involved. -Wean Decadron to 4 mg every 8 hours -Continue home Keppra -Appreciate input by palliative care   Left leg weakness Likely from brain mets.  4/5 motor strength in BLE on my exam. -Wean Decadron to 4 mg every 8 hours -Continue PT/OT  Fall at home, initial encounter Likely due to brain mets.  No significant injury -Fall precaution -PT/OT  Cognitive impairment Seems to have underlying cognitive impairment.  He is only oriented to self, "hospital" but not the name of the hospital, city or time. -Reorientation and delirium precaution  Anemia associated with chemotherapy Recent Labs    03/08/21 1302 03/29/21 1350 04/19/21 1331 05/16/21 1213 06/07/21 1106 06/28/21 1338  07/02/21 1142 07/03/21 0917 07/04/21 1412 07/05/21 0520  HGB 9.8* 10.7* 10.8* 11.5* 11.5* 11.2* 12.6* 12.5* 11.3* 11.2*  Hemoglobin seems to be at baseline. -Continue monitoring   Leukopenia with mild neutropenia Initially had lymphocytopenia.  Now with mild neutropenia. -Continue monitoring. -Per oncology.  Diabetes mellitus type 2 in nonobese (HCC) Controlled with hyperglycemia.  A1c 6.7% on 05/01/2021. Recent Labs  Lab  07/04/21 1143 07/04/21 1645 07/04/21 2012 07/05/21 0722 07/05/21 1136  GLUCAP 247* 163* 209* 187* 190*  -Continue SSI-moderate -Continue Semglee 10 units twice daily -Add NovoLog 3 units 3 times daily with meals -Anticipate improvement in hyperglycemia as we decrease the steroid -Continue holding home glipizide, metformin and Januvia   Hypertension BP stable off home antihypertensive meds. -Continue holding home antihypertensive meds  Dyslipidemia associated with type 2 diabetes mellitus ([place]) - Continue home statin.  Goals of care, counseling/discussion Patient with metastatic brain cancer as above.  Poor long-term prognosis.  -Palliative care following.  Remains full code        DVT prophylaxis:  Place and maintain sequential compression device Start: 07/03/21 0759 SCDs Start: 07/02/21 1626  Code Status: Full code Family Communication: Updated patient's sister over the phone Level of care: Med-Surg Status is: Inpatient Remains inpatient appropriate because: Leg weakness, fall at home and progressive brain metastasis from lung cancer    Final disposition: SNF once cleared by consultants    Consultants:  Oncology Neuro oncology Radiation oncology Palliative medicine   Sch Meds:  Scheduled Meds:  dexamethasone  4 mg Oral Y1E   folic acid  1 mg Oral Daily   insulin aspart  0-15 Units Subcutaneous TID WC   insulin aspart  3 Units Subcutaneous TID WC   insulin glargine-yfgn  10 Units Subcutaneous BID   latanoprost  1 drop Both Eyes QHS   levETIRAcetam  1,000 mg Oral BID   pantoprazole  40 mg Oral Daily   rosuvastatin  20 mg Oral Daily   Continuous Infusions:   PRN Meds:.acetaminophen **OR** acetaminophen, ondansetron **OR** ondansetron (ZOFRAN) IV, oxyCODONE  Antimicrobials: Anti-infectives (From admission, onward)    None        I have personally reviewed the following labs and images: CBC: Recent Labs  Lab 07/02/21 1142 07/03/21 0917  07/04/21 1412 07/05/21 0520  WBC 3.3* 3.1* 3.3* 2.7*  NEUTROABS 3.1 2.7  --  1.4*  HGB 12.6* 12.5* 11.3* 11.2*  HCT 38.8* 38.3* 34.1* 34.5*  MCV 98.7 98.2 98.6 99.1  PLT 244 230 181 164   BMP &GFR Recent Labs  Lab 07/02/21 1142 07/03/21 0917 07/05/21 0520  NA 141 140 136  K 3.6 4.0 4.2  CL 104 107 103  CO2 30 23 27   GLUCOSE 114* 223* 194*  BUN 12 16 20   CREATININE 0.77 0.83 0.72  CALCIUM 9.5 9.1 8.8*  MG  --  1.9 2.2  PHOS  --  5.3* 4.0   Estimated Creatinine Clearance: 73.1 mL/min (by C-G formula based on SCr of 0.72 mg/dL). Liver & Pancreas: Recent Labs  Lab 07/02/21 1142 07/03/21 0917 07/05/21 0520  AST 38  --   --   ALT 40  --   --   ALKPHOS 80  --   --   BILITOT 0.3  --   --   PROT 7.0  --   --   ALBUMIN 3.9 3.3* 3.2*   No results for input(s): LIPASE, AMYLASE in the last 168 hours. No results for input(s): AMMONIA in the last 168 hours.  Diabetic: No results for input(s): HGBA1C in the last 72 hours. Recent Labs  Lab 07/04/21 1143 07/04/21 1645 07/04/21 2012 07/05/21 0722 07/05/21 1136  GLUCAP 247* 163* 209* 187* 190*   Cardiac Enzymes: No results for input(s): CKTOTAL, CKMB, CKMBINDEX, TROPONINI in the last 168 hours. No results for input(s): PROBNP in the last 8760 hours. Coagulation Profile: No results for input(s): INR, PROTIME in the last 168 hours. Thyroid Function Tests: No results for input(s): TSH, T4TOTAL, FREET4, T3FREE, THYROIDAB in the last 72 hours.  Lipid Profile: No results for input(s): CHOL, HDL, LDLCALC, TRIG, CHOLHDL, LDLDIRECT in the last 72 hours. Anemia Panel: No results for input(s): VITAMINB12, FOLATE, FERRITIN, TIBC, IRON, RETICCTPCT in the last 72 hours. Urine analysis:    Component Value Date/Time   COLORURINE YELLOW 07/02/2021 [place] 07/02/2021 1123   LABSPEC 1.020 07/02/2021 1123   PHURINE 6.0 07/02/2021 1123   GLUCOSEU NEGATIVE 07/02/2021 1123   HGBUR NEGATIVE 07/02/2021 1123    BILIRUBINUR NEGATIVE 07/02/2021 1123   KETONESUR NEGATIVE 07/02/2021 1123   PROTEINUR 30 (A) 07/02/2021 1123   NITRITE NEGATIVE 07/02/2021 1123   LEUKOCYTESUR NEGATIVE 07/02/2021 1123   Sepsis Labs: Invalid input(s): PROCALCITONIN, [place]  Microbiology: Recent Results (from the past 240 hour(s))  Resp Panel by RT-PCR (Flu A&B, Covid) Nasopharyngeal Swab     Status: None   Collection Time: 07/02/21  3:43 PM   Specimen: Nasopharyngeal Swab; Nasopharyngeal(NP) swabs in vial transport medium  Result Value Ref Range Status   SARS Coronavirus 2 by RT PCR NEGATIVE NEGATIVE Final    Comment: (NOTE) SARS-CoV-2 target nucleic acids are NOT DETECTED.  The SARS-CoV-2 RNA is generally detectable in upper respiratory specimens during the acute phase of infection. The lowest concentration of SARS-CoV-2 viral copies this assay can detect is 138 copies/mL. A negative result does not preclude SARS-Cov-2 infection and should not be used as the sole basis for treatment or other patient management decisions. A negative result may occur with  improper specimen collection/handling, submission of specimen other than nasopharyngeal swab, presence of viral mutation(s) within the areas targeted by this assay, and inadequate number of viral copies(<138 copies/mL). A negative result must be combined with clinical observations, patient history, and epidemiological information. The expected result is Negative.  Fact Sheet for Patients:  EntrepreneurPulse.com.au  Fact Sheet for Healthcare Providers:  IncredibleEmployment.be  This test is no t yet approved or cleared by the [place] FDA and  has been authorized for detection and/or diagnosis of SARS-CoV-2 by FDA under an Emergency Use Authorization (EUA). This EUA will remain  in effect (meaning this test can be used) for the duration of the COVID-19 declaration under Section 564(b)(1) of the Act,  21 U.S.C.section 360bbb-3(b)(1), unless the authorization is terminated  or revoked sooner.       Influenza A by PCR NEGATIVE NEGATIVE Final   Influenza B by PCR NEGATIVE NEGATIVE Final    Comment: (NOTE) The Xpert Xpress SARS-CoV-2/FLU/RSV plus assay is intended as an aid in the diagnosis of influenza from Nasopharyngeal swab specimens and should not be used as a sole basis for treatment. Nasal washings and aspirates are unacceptable for Xpert Xpress SARS-CoV-2/FLU/RSV testing.  Fact Sheet for Patients: EntrepreneurPulse.com.au  Fact Sheet for Healthcare Providers: IncredibleEmployment.be  This test is not yet approved or cleared by the [place] FDA and has been authorized for detection and/or diagnosis of SARS-CoV-2 by FDA under an Emergency Use Authorization (EUA). This EUA will remain in effect (meaning this  test can be used) for the duration of the COVID-19 declaration under Section 564(b)(1) of the Act, 21 U.S.C. section 360bbb-3(b)(1), unless the authorization is terminated or revoked.  Performed at [facility] [place] [address] [place],  [address]     Radiology Studies: No results found.     Morrisa Aldaba T. [place]  If 7PM-7AM, please contact night-coverage [email or web address] 07/05/2021, 3:46 PM

## 2021-07-05 NOTE — Progress Notes (Signed)
Physical Therapy Treatment ?Patient Details ?Name: Benjamin Willis. ?MRN: [id number] ?DOB: 06/16/1945 ?Today's Date: 07/05/2021 ? ? 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 ? ?  ?PT Comments  ? ? Pt ambulated 40' x 2 with RW and with seated rest break, he required ongoing verbal cues for safe positioning in RW, no loss of balance. Pt is pleasant and puts forth good effort.   ?Recommendations for follow up therapy are one component of a multi-disciplinary discharge planning process, led by the attending physician.  Recommendations may be updated based on patient status, additional functional criteria and insurance authorization. ? ?Follow Up Recommendations ? Skilled nursing-short term rehab (<3 hours/day) ?  ?  ?Assistance Recommended at Discharge    ?Patient can return home with the following Assistance with cooking/housework;Direct supervision/assist for medications management;Direct supervision/assist for financial management;Help with stairs or ramp for entrance;Assist for transportation;A lot of help with bathing/dressing/bathroom;A lot of help with walking and/or transfers ?  ?Equipment Recommendations ? None recommended by PT  ?  ?Recommendations for Other Services   ? ? ?  ?Precautions / Restrictions Precautions ?Precautions: Fall ?Precaution Comments: fell just prior to admission, family denies other falls in past 6 months ?Restrictions ?Weight Bearing Restrictions: No  ?  ? ?Mobility ? Bed Mobility ?   ?Bed Mobility: Supine to Sit ?  ?  ?Supine to sit: Modified independent (Device/Increase time), Supervision ?  ?  ?  ?  ? ?Transfers ?Overall transfer level: Needs assistance ?Equipment used: Rolling walker (2 wheels) ?  ?Sit to Stand: Min assist ?  ?  ?  ?  ?  ?General transfer comment: assist to power up and steady, mild posterior lean initially ?  ? ?Ambulation/Gait ?Ambulation/Gait assistance: Min guard ?Gait Distance (Feet): 40 Feet (40' x 2 with seated rest) ?Assistive device: Rolling walker (2 wheels) ?Gait Pattern/deviations: Step-to pattern, Decreased step length - right, Decreased step length - left, Trunk flexed ?Gait velocity: decr ?  ?  ?General Gait Details: 83' x 2 with seated rest, ongoing verbal cues to step closer to RW, no loss of balance ? ? ?Stairs ?  ?  ?  ?  ?  ? ? ?Wheelchair Mobility ?  ? ?Modified Rankin (Stroke Patients Only) ?  ? ? ?  ?Balance Overall balance assessment: History of Falls, Needs assistance ?Sitting-balance support: Feet supported, No upper extremity supported ?Sitting balance-Leahy Scale: Fair ?  ?  ?Standing balance support: Bilateral upper extremity supported, During functional activity ?Standing balance-Leahy Scale: Poor ?Standing balance comment: relies on BUE support, posterior lean initially, LT knee buckle ?  ?  ?  ?  ?  ?  ?  ?  ?  ?  ?  ?  ? ?  ?Cognition Arousal/Alertness: Awake/alert ?Behavior During Therapy: [facility] for tasks assessed/performed ?Overall Cognitive Status: History of cognitive impairments - at baseline ?  ?  ?  ?  ?  ?  ?  ?  ?  ?  ?  ?  ?  ?  ?  ?  ?  General Comments: increased processing time ?  ?  ? ?  ?Exercises   ? ?  ?General Comments   ?  ?  ? ?Pertinent Vitals/Pain Pain Assessment ?Pain Assessment: 0-10 ?Pain Score: 8  ?Pain Location: L knee ?Pain Descriptors / Indicators: Guarding ?Pain Intervention(s): Limited activity within patient's tolerance, Monitored during session, Patient requesting pain meds-RN notified  ? ? ?Home Living   ?  ?  ?   ?  ?  ?  ?  ?  ?  ?   ?  ?Prior Function    ?  ?  ?   ? ?PT Goals (current goals can now be found in the care plan section) Acute Rehab PT Goals ?Patient Stated Goal: short term rehab per sister ?PT Goal Formulation: With patient ?Time For Goal Achievement: 07/17/21 ?Potential to Achieve Goals: Fair ?Progress towards PT goals: Progressing toward goals ? ?  ?Frequency ? ? ? Min 2X/week ? ? ? ?  ?PT Plan Current plan remains appropriate  ? ? ?Co-evaluation   ?  ?  ?  ?  ? ?  ?AM-PAC PT "6 Clicks" Mobility   ?Outcome Measure ? Help needed turning from your back to your side while in a flat bed without using bedrails?: A Little ?Help needed moving from lying on your back to sitting on the side of a flat bed without using bedrails?: A Little ?Help needed moving to and from a bed to a chair (including a wheelchair)?: A Little ?Help needed standing up from a chair using your arms (e.g., wheelchair or bedside chair)?: A Little ?Help needed to walk in hospital room?: A Little ?Help needed climbing 3-5 steps with a railing? : A Lot ?6 Click Score: 17 ? ?  ?End of Session Equipment Utilized During Treatment: Gait belt ?Activity Tolerance: Patient tolerated treatment well ?Patient left: in chair;with chair alarm set;with call bell/phone within reach ?Nurse Communication: Mobility status ?PT Visit Diagnosis: Unsteadiness on feet (R26.81);Muscle weakness (generalized) (M62.81);Difficulty in walking, not elsewhere classified (R26.2);History of falling (Z91.81) ?  ? ? ?Time: 7121-9758 ?PT Time Calculation (min) (ACUTE ONLY): 23 min ? ?Charges:  $Gait Training: 8-22 mins ?$Therapeutic Activity: 8-22 mins          ?          ? ?Philomena Doheny PT 07/05/2021  ?Acute Rehabilitation Services ?Pager [phone] ?Office [phone] ? ? ?

## 2021-07-05 NOTE — TOC Initial Note (Signed)
Transition of Care (TOC) - Initial/Assessment Note  ? ? ?Patient Details  ?Name: Benjamin Willis. ?MRN: [id number] ?Date of Birth: 05/11/1945 ? ?Transition of Care (TOC) CM/SW Contact:    ?Tawanna Cooler, RN ?Phone Number: ?07/05/2021, 1:05 PM ? ?Clinical Narrative:                 ? ?Discharge planning on going. Will need treatment plan and goals of care prior to completing a discharge plan.   ?Per MD, patient will be here several more days.  TOC continuing to follow.  ? ? ?Expected Discharge Plan: [place] ?Barriers to Discharge: Continued Medical Work up ? ? ?Patient Goals and CMS Choice ?Patient states their goals for this hospitalization and ongoing recovery are:: return home ?  ?  ? ?Expected Discharge Plan and Services ?Expected Discharge Plan: [place] ?  ?  ?  ?Living arrangements for the past 2 months: [place] ?                ?  ?  ?  ?  ?  ?  ?  ?  ?  ?  ? ?Prior Living Arrangements/Services ?Living arrangements for the past 2 months: [place] ?Lives with:: Siblings (sister) ?Patient language and need for interpreter reviewed:: Yes ?Do you feel safe going back to the place where you live?: Yes      ?Need for Family Participation in Patient Care: Yes (Comment) ?Care giver support system in place?: Yes (comment) ?  ?Criminal Activity/Legal Involvement Pertinent to Current Situation/Hospitalization: No - Comment as needed ? ?Activities of Daily Living ?Home Assistive Devices/Equipment: Gilford Rile (specify type) ?ADL Screening (condition at time of admission) ?Patient's cognitive ability adequate to safely complete daily activities?: No ?Is the patient deaf or have difficulty hearing?: No ?Does the patient have difficulty seeing, even when wearing glasses/contacts?: No ?Does the patient have difficulty concentrating, remembering, or making decisions?: Yes ?Patient able to express need for assistance with ADLs?: Yes ?Does the patient have difficulty dressing or  bathing?: Yes ?Independently performs ADLs?: No ?Communication: Independent ?Dressing (OT): Needs assistance ?Is this a change from baseline?: Pre-admission baseline ?Grooming: Needs assistance ?Is this a change from baseline?: Pre-admission baseline ?Feeding: Independent ?Bathing: Needs assistance ?Is this a change from baseline?: Pre-admission baseline ?Toileting: Needs assistance ?Is this a change from baseline?: Change from baseline, expected to last <3 days ?In/Out Bed: Needs assistance ?Is this a change from baseline?: Change from baseline, expected to last <3 days ?Walks in Home: Needs assistance ?Is this a change from baseline?: Pre-admission baseline ?Does the patient have difficulty walking or climbing stairs?: Yes ?Weakness of Legs: Both ?Weakness of Arms/Hands: None ? ?Permission Sought/Granted ?  ?  ?   ?   ?   ?   ? ?Emotional Assessment ?  ?  ?  ?  ?  ?Psych Involvement: No (comment) ? ?Admission diagnosis:  Metastasis to brain [facility]) [C79.31] ?Patient Active Problem List  ? Diagnosis Date Noted  ? Cognitive impairment 07/04/2021  ? Fall at home, initial encounter 07/03/2021  ? Left leg weakness 07/03/2021  ? Leukopenia with lymphocytopenia 07/02/2021  ? Anemia associated with chemotherapy 07/02/2021  ? 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 Port-A-Cath in place 12/14/2020  ? Brain metastases ([place])   ? Palliative care by specialist   ? Metastatic lung cancer (metastasis from lung to other site) [facility]) 10/16/2020  ? Diabetes mellitus type 2 in nonobese (HCC)  10/16/2020  ? Glaucoma 10/16/2020  ? Atherosclerosis of aorta ([place]) 05/31/2020  ? Primary cancer of right upper lobe of lung ([place]) 02/07/2019  ? Lung nodule 02/02/2019  ? Hypertension 05/24/2018  ? Dyslipidemia 05/24/2018  ? Dyslipidemia associated with type 2 diabetes mellitus ([place]) 06/09/2017  ? Goals of care, counseling/discussion 01/01/2017  ? ?PCP:  Horald Pollen, MD ?Pharmacy:   ?[facility] ([place]), [place] ?[place] ?[place] ([place]) [place] [address] ?Phone: [phone] Fax: [phone] ? ? ? ? ?Social Determinants of Health (SDOH) Interventions ?  ? ?Readmission Risk Interventions ?Readmission Risk Prevention Plan 07/05/2021 01/10/2021  ?Transportation Screening Complete Complete  ?PCP or Specialist Appt within 3-5 Days Complete Complete  ?[place] or Home Care Consult Complete Complete  ?Social Work Consult for [place] Planning/Counseling Complete Complete  ?Palliative Care Screening Complete Not Applicable  ?Medication Review [employer]) Complete Complete  ?Some recent data might be hidden  ? ? ? ?

## 2021-07-05 NOTE — Progress Notes (Signed)
Hematology/Oncology Progress Note ? ?Clinical Summary: 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 ? ?Interval History: ?-- [name] reports that he is feeling better in the interim since her last visit ?--Patient has been ambulating with physical therapy.  He will require rehab stay which is currently being arranged by primary team ?--Patient is tolerating steroid therapy well, though does have some residual weakness on right side ?--No other new concerning symptoms in the interim.  He denies any fevers, chills, sweats, nausea, vomiting or diarrhea. ? ?O: ? ?Vitals:  ? 07/05/21 0440 07/05/21 1305  ?BP: 127/76 127/69  ?Pulse: 70 72  ?Resp: 16   ?Temp: (!) 97.4 ?F (36.3 ?C) 98.2 ?F (36.8 ?C)  ?SpO2: 97% 97%  ? ?CMP Latest Ref Rng & Units 07/05/2021 07/03/2021 07/02/2021  ?Glucose 70 - 99 mg/dL 194(H) 223(H) 114(H)  ?BUN 8 - 23 mg/dL 20 16 12   ?Creatinine 0.61 - 1.24 mg/dL 0.72 0.83 0.77  ?Sodium 135 - 145 mmol/L 136 140 141  ?Potassium 3.5 - 5.1 mmol/L 4.2 4.0 3.6  ?Chloride 98 - 111 mmol/L 103 107 104  ?CO2 22 - 32 mmol/L 27 23 30   ?Calcium 8.9 - 10.3 mg/dL 8.8(L) 9.1 9.5  ?Total Protein 6.5 - 8.1 g/dL - - 7.0  ?Total Bilirubin 0.3 - 1.2 mg/dL - - 0.3  ?Alkaline Phos 38 - 126 U/L - - 80  ?AST 15 - 41 U/L - - 38  ?ALT 0 - 44 U/L - - 40  ? ?CBC Latest Ref Rng & Units 07/05/2021 07/04/2021 07/03/2021  ?WBC 4.0 - 10.5 K/uL 2.7(L) 3.3(L) 3.1(L)  ?Hemoglobin 13.0 - 17.0 g/dL 11.2(L) 11.3(L) 12.5(L)  ?Hematocrit 39.0 - 52.0 % 34.5(L) 34.1(L) 38.3(L)  ?Platelets 150 - 400 K/uL 164 181 230  ?  ? ? ?GENERAL: well appearing elderly African-American male in NAD  ?SKIN: skin color, texture, turgor are normal, no rashes or significant lesions ?EYES: conjunctiva are pink and non-injected, sclera clear ?LUNGS: clear to auscultation and percussion with normal breathing  effort ?HEART: regular rate & rhythm and no murmurs and no lower extremity edema ?Musculoskeletal: no cyanosis of digits and no clubbing  ?PSYCH: alert & oriented x 3, fluent speech ?NEURO: no focal motor/sensory deficits ? ?Assessment/Plan: ?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 ? 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  Treatment will need to be directed at the brain metastases. ?--Oncology will continue to follow ? ? ?Ledell Peoples, MD ?Department of Hematology/Oncology ?[facility] ?Phone: [phone] ?Pager: [phone] ?Email: [email or web address] ? ?

## 2021-07-06 DIAGNOSIS — C3490 Malignant neoplasm of unspecified part of unspecified bronchus or lung: Secondary | ICD-10-CM | POA: Diagnosis not present

## 2021-07-06 DIAGNOSIS — D6481 Anemia due to antineoplastic chemotherapy: Secondary | ICD-10-CM | POA: Diagnosis not present

## 2021-07-06 DIAGNOSIS — W19XXXA Unspecified fall, initial encounter: Secondary | ICD-10-CM | POA: Diagnosis not present

## 2021-07-06 DIAGNOSIS — Z7189 Other specified counseling: Secondary | ICD-10-CM | POA: Diagnosis not present

## 2021-07-06 LAB — CBC WITH DIFFERENTIAL/PLATELET
Abs Immature Granulocytes: 0.02 10*3/uL (ref 0.00–0.07)
Basophils Absolute: 0 10*3/uL (ref 0.0–0.1)
Basophils Relative: 1 %
Eosinophils Absolute: 0 10*3/uL (ref 0.0–0.5)
Eosinophils Relative: 0 %
HCT: 34.4 % — ABNORMAL LOW (ref 39.0–52.0)
Hemoglobin: 11.4 g/dL — ABNORMAL LOW (ref 13.0–17.0)
Immature Granulocytes: 1 %
Lymphocytes Relative: 45 %
Lymphs Abs: 1.7 10*3/uL (ref 0.7–4.0)
MCH: 32.8 pg (ref 26.0–34.0)
MCHC: 33.1 g/dL (ref 30.0–36.0)
MCV: 98.9 fL (ref 80.0–100.0)
Monocytes Absolute: 1 10*3/uL (ref 0.1–1.0)
Monocytes Relative: 29 %
Neutro Abs: 0.8 10*3/uL — ABNORMAL LOW (ref 1.7–7.7)
Neutrophils Relative %: 24 %
Platelets: 155 10*3/uL (ref 150–400)
RBC: 3.48 MIL/uL — ABNORMAL LOW (ref 4.22–5.81)
RDW: 12.2 % (ref 11.5–15.5)
WBC: 3.5 10*3/uL — ABNORMAL LOW (ref 4.0–10.5)
nRBC: 1.4 % — ABNORMAL HIGH (ref 0.0–0.2)

## 2021-07-06 LAB — GLUCOSE, CAPILLARY
Glucose-Capillary: 154 mg/dL — ABNORMAL HIGH (ref 70–99)
Glucose-Capillary: 218 mg/dL — ABNORMAL HIGH (ref 70–99)
Glucose-Capillary: 263 mg/dL — ABNORMAL HIGH (ref 70–99)
Glucose-Capillary: 212 mg/dL — ABNORMAL HIGH (ref 70–99)

## 2021-07-06 MED ORDER — INSULIN ASPART 100 UNIT/ML IJ SOLN
5.0000 [IU] | Freq: Three times a day (TID) | INTRAMUSCULAR | Status: DC
  Administered 2021-07-06 – 2021-07-15 (×23): 5 [IU] via SUBCUTANEOUS

## 2021-07-06 MED ORDER — INSULIN GLARGINE-YFGN 100 UNIT/ML ~~LOC~~ SOLN
15.0000 [IU] | Freq: Two times a day (BID) | SUBCUTANEOUS | Status: DC
  Administered 2021-07-06 – 2021-07-07 (×2): 15 [IU] via SUBCUTANEOUS
  Filled 2021-07-06 (×3): qty 0.15

## 2021-07-06 NOTE — Assessment & Plan Note (Signed)
Continue home eyedrops °

## 2021-07-06 NOTE — Progress Notes (Signed)
PROGRESS NOTE  Benjamin Willis. DXA:[id number] DOB: 06/29/1945   PCP: Horald Pollen, MD  Patient is from: Home.  DOA: 07/02/2021 LOS: 4  Chief complaints:  Chief Complaint  Patient presents with   Weakness     Brief Narrative / Interim history: 76 year old M with PMH of NSCLC of right lung with mets to brain s/p SBRT but did not wish to undergo SRS followed by Dr. Lorenso Courier presenting with generalized weakness, BLE weakness, tremors and fall when he tried to get out of the bed, and admitted for fall at home, left leg weakness and generalized weakness.  Vitals are stable.  Tmax 99.4.  Labs with stable CMP, anemia and leukopenia.  UA not suggestive for UTI.  CT head and MRI brain with for metastatic deposits in brain (progressed since his MRI on 05/13/2021) increased surrounding edema in the right parietal lobe with evidence of prior hemorrhage with progression of hemorrhage into right cerebellar lesion, left occipital lesion and right parietal lesion.  Started on steroid.   Oncology, neurooncology and radiation oncology involved.  Ongoing discussion about treatment plan    Subjective: Seen and examined earlier this morning.  No major events overnight of this morning.  No complaints but not a great historian.  He responds no to pain, shortness of breath, GI or UTI symptoms.  Reports improvement in his weakness.  Objective: Vitals:   07/05/21 1305 07/05/21 2011 07/06/21 0540 07/06/21 1318  BP: 127/69 126/76 134/74 135/73  Pulse: 72 73 67 70  Resp:  18 16 14   Temp: 98.2 F (36.8 C) 98.4 F (36.9 C) (!) 97.5 F (36.4 C) 98.1 F (36.7 C)  TempSrc: Oral Oral Oral Oral  SpO2: 97% 98% 97% 98%  Weight:      Height:        Examination: GENERAL: No apparent distress.  Nontoxic. HEENT: MMM.  Vision and hearing grossly intact.  NECK: Supple.  No apparent JVD.  RESP:  No IWOB.  Fair aeration bilaterally. CVS:  RRR. Heart sounds normal.  ABD/GI/GU: BS+. Abd soft, NTND.  MSK/EXT:   Moves extremities. No apparent deformity. No edema.  SKIN: no apparent skin lesion or wound NEURO: Awake and alert.  Oriented to self and place.  No apparent focal neuro deficit. PSYCH: Calm. Normal affect.   Procedures:  None  Microbiology summarized: MCNOB-09 and influenza PCR nonreactive.  Assessment and Plan: * Non-small cell lung cancer metastatic to brain [facility]) Patient presents with fall, bilateral leg weakness, left> right.  MRI brain with increased brain mets, edema, and evidence of prior hemorrhage with progression in the right cerebellar lesion, left occipital lesion or right parietal lesions.  Started on Decadron.  Motor 4/5 in BLE on my exam. -Oncology, neurooncology and radiation oncology involved. -Wean Decadron to 4 mg every 12 hours. -Continue home Keppra -Appreciate input by palliative care   Left leg weakness Likely from brain mets.  4/5 motor strength in BLE on my exam. -Decadron as above. -Continue PT/OT  Fall at home, initial encounter Likely due to brain mets.  No significant injury -Fall precaution -PT/OT  Cognitive impairment Seems to have underlying cognitive impairment.  He is only oriented to self, "hospital" but not the name of the hospital, city or time. -Reorientation and delirium precaution  Anemia associated with chemotherapy Recent Labs    03/29/21 1350 04/19/21 1331 05/16/21 1213 06/07/21 1106 06/28/21 1338 07/02/21 1142 07/03/21 0917 07/04/21 1412 07/05/21 0520 07/06/21 0531  HGB 10.7* 10.8* 11.5* 11.5* 11.2* 12.6* 12.5*  11.3* 11.2* 11.4*  Hemoglobin seems to be at baseline. -Continue monitoring   Leukopenia with mild neutropenia Initially had lymphocytopenia.  Now with mild neutropenia. -Continue monitoring. -Per oncology.  Glaucoma Continue home eyedrops  Diabetes mellitus type 2 in nonobese (HCC) Controlled with hyperglycemia.  A1c 6.7% on 05/01/2021. Recent Labs  Lab 07/05/21 1136 07/05/21 1746 07/05/21 2012  07/06/21 0735 07/06/21 1128  GLUCAP 190* 219* 320* 154* 218*  -Continue SSI-moderate -Increase Semglee from 10 units twice daily to 15 units twice daily -Increase NovoLog from 3 units to 5 units 3 times daily -Anticipate improvement in hyperglycemia as we decrease the steroid -Continue holding home glipizide, metformin and Januvia   Hypertension BP stable off home antihypertensive meds. -Continue holding home antihypertensive meds  Dyslipidemia associated with type 2 diabetes mellitus ([place]) - Continue home statin.  Goals of care, counseling/discussion Patient with metastatic brain cancer as above.  Poor long-term prognosis.  -Palliative care following.  Remains full code        DVT prophylaxis:  Place and maintain sequential compression device Start: 07/03/21 0759 SCDs Start: 07/02/21 1626  Code Status: Full code Family Communication: Updated patient's sister over the phone on 3/3. Level of care: Med-Surg Status is: Inpatient Remains inpatient appropriate because: Leg weakness, fall at home and progressive brain metastasis from lung cancer    Final disposition: SNF once cleared by consultants    Consultants:  Oncology Neuro oncology Radiation oncology Palliative medicine   Sch Meds:  Scheduled Meds:  dexamethasone  4 mg Oral H6D   folic acid  1 mg Oral Daily   insulin aspart  0-15 Units Subcutaneous TID WC   insulin aspart  5 Units Subcutaneous TID WC   insulin glargine-yfgn  15 Units Subcutaneous BID   latanoprost  1 drop Both Eyes QHS   levETIRAcetam  1,000 mg Oral BID   pantoprazole  40 mg Oral Daily   rosuvastatin  20 mg Oral Daily   Continuous Infusions:   PRN Meds:.acetaminophen **OR** acetaminophen, ondansetron **OR** ondansetron (ZOFRAN) IV, oxyCODONE  Antimicrobials: Anti-infectives (From admission, onward)    None        I have personally reviewed the following labs and images: CBC: Recent Labs  Lab 07/02/21 1142 07/03/21 0917  07/04/21 1412 07/05/21 0520 07/06/21 0531  WBC 3.3* 3.1* 3.3* 2.7* 3.5*  NEUTROABS 3.1 2.7  --  1.4* 0.8*  HGB 12.6* 12.5* 11.3* 11.2* 11.4*  HCT 38.8* 38.3* 34.1* 34.5* 34.4*  MCV 98.7 98.2 98.6 99.1 98.9  PLT 244 230 181 164 155   BMP &GFR Recent Labs  Lab 07/02/21 1142 07/03/21 0917 07/05/21 0520  NA 141 140 136  K 3.6 4.0 4.2  CL 104 107 103  CO2 30 23 27   GLUCOSE 114* 223* 194*  BUN 12 16 20   CREATININE 0.77 0.83 0.72  CALCIUM 9.5 9.1 8.8*  MG  --  1.9 2.2  PHOS  --  5.3* 4.0   Estimated Creatinine Clearance: 73.1 mL/min (by C-G formula based on SCr of 0.72 mg/dL). Liver & Pancreas: Recent Labs  Lab 07/02/21 1142 07/03/21 0917 07/05/21 0520  AST 38  --   --   ALT 40  --   --   ALKPHOS 80  --   --   BILITOT 0.3  --   --   PROT 7.0  --   --   ALBUMIN 3.9 3.3* 3.2*   No results for input(s): LIPASE, AMYLASE in the last 168 hours. No results for input(s):  AMMONIA in the last 168 hours. Diabetic: Recent Labs    07/05/21 0520  HGBA1C 8.3*   Recent Labs  Lab 07/05/21 1136 07/05/21 1746 07/05/21 2012 07/06/21 0735 07/06/21 1128  GLUCAP 190* 219* 320* 154* 218*   Cardiac Enzymes: No results for input(s): CKTOTAL, CKMB, CKMBINDEX, TROPONINI in the last 168 hours. No results for input(s): PROBNP in the last 8760 hours. Coagulation Profile: No results for input(s): INR, PROTIME in the last 168 hours. Thyroid Function Tests: No results for input(s): TSH, T4TOTAL, FREET4, T3FREE, THYROIDAB in the last 72 hours.  Lipid Profile: No results for input(s): CHOL, HDL, LDLCALC, TRIG, CHOLHDL, LDLDIRECT in the last 72 hours. Anemia Panel: No results for input(s): VITAMINB12, FOLATE, FERRITIN, TIBC, IRON, RETICCTPCT in the last 72 hours. Urine analysis:    Component Value Date/Time   COLORURINE YELLOW 07/02/2021 [place] 07/02/2021 1123   LABSPEC 1.020 07/02/2021 1123   PHURINE 6.0 07/02/2021 1123   GLUCOSEU NEGATIVE 07/02/2021 1123    HGBUR NEGATIVE 07/02/2021 1123   BILIRUBINUR NEGATIVE 07/02/2021 1123   KETONESUR NEGATIVE 07/02/2021 1123   PROTEINUR 30 (A) 07/02/2021 1123   NITRITE NEGATIVE 07/02/2021 1123   LEUKOCYTESUR NEGATIVE 07/02/2021 1123   Sepsis Labs: Invalid input(s): PROCALCITONIN, [place]  Microbiology: Recent Results (from the past 240 hour(s))  Resp Panel by RT-PCR (Flu A&B, Covid) Nasopharyngeal Swab     Status: None   Collection Time: 07/02/21  3:43 PM   Specimen: Nasopharyngeal Swab; Nasopharyngeal(NP) swabs in vial transport medium  Result Value Ref Range Status   SARS Coronavirus 2 by RT PCR NEGATIVE NEGATIVE Final    Comment: (NOTE) SARS-CoV-2 target nucleic acids are NOT DETECTED.  The SARS-CoV-2 RNA is generally detectable in upper respiratory specimens during the acute phase of infection. The lowest concentration of SARS-CoV-2 viral copies this assay can detect is 138 copies/mL. A negative result does not preclude SARS-Cov-2 infection and should not be used as the sole basis for treatment or other patient management decisions. A negative result may occur with  improper specimen collection/handling, submission of specimen other than nasopharyngeal swab, presence of viral mutation(s) within the areas targeted by this assay, and inadequate number of viral copies(<138 copies/mL). A negative result must be combined with clinical observations, patient history, and epidemiological information. The expected result is Negative.  Fact Sheet for Patients:  EntrepreneurPulse.com.au  Fact Sheet for Healthcare Providers:  IncredibleEmployment.be  This test is no t yet approved or cleared by the [place] FDA and  has been authorized for detection and/or diagnosis of SARS-CoV-2 by FDA under an Emergency Use Authorization (EUA). This EUA will remain  in effect (meaning this test can be used) for the duration of the COVID-19 declaration under Section  564(b)(1) of the Act, 21 U.S.C.section 360bbb-3(b)(1), unless the authorization is terminated  or revoked sooner.       Influenza A by PCR NEGATIVE NEGATIVE Final   Influenza B by PCR NEGATIVE NEGATIVE Final    Comment: (NOTE) The Xpert Xpress SARS-CoV-2/FLU/RSV plus assay is intended as an aid in the diagnosis of influenza from Nasopharyngeal swab specimens and should not be used as a sole basis for treatment. Nasal washings and aspirates are unacceptable for Xpert Xpress SARS-CoV-2/FLU/RSV testing.  Fact Sheet for Patients: EntrepreneurPulse.com.au  Fact Sheet for Healthcare Providers: IncredibleEmployment.be  This test is not yet approved or cleared by the [place] FDA and has been authorized for detection and/or diagnosis of SARS-CoV-2 by FDA under an Emergency Use Authorization (EUA).  This EUA will remain in effect (meaning this test can be used) for the duration of the COVID-19 declaration under Section 564(b)(1) of the Act, 21 U.S.C. section 360bbb-3(b)(1), unless the authorization is terminated or revoked.  Performed at [facility], [place] [address], [place], [place] [address]     Radiology Studies: No results found.     Nesreen Albano T. [place]  If 7PM-7AM, please contact night-coverage [email or web address] 07/06/2021, 2:37 PM

## 2021-07-07 DIAGNOSIS — D6481 Anemia due to antineoplastic chemotherapy: Secondary | ICD-10-CM | POA: Diagnosis not present

## 2021-07-07 DIAGNOSIS — W19XXXA Unspecified fall, initial encounter: Secondary | ICD-10-CM | POA: Diagnosis not present

## 2021-07-07 DIAGNOSIS — B029 Zoster without complications: Secondary | ICD-10-CM

## 2021-07-07 DIAGNOSIS — C3490 Malignant neoplasm of unspecified part of unspecified bronchus or lung: Secondary | ICD-10-CM | POA: Diagnosis not present

## 2021-07-07 DIAGNOSIS — Z7189 Other specified counseling: Secondary | ICD-10-CM | POA: Diagnosis not present

## 2021-07-07 LAB — CBC
HCT: 35.6 % — ABNORMAL LOW (ref 39.0–52.0)
Hemoglobin: 11.8 g/dL — ABNORMAL LOW (ref 13.0–17.0)
MCH: 32.5 pg (ref 26.0–34.0)
MCHC: 33.1 g/dL (ref 30.0–36.0)
MCV: 98.1 fL (ref 80.0–100.0)
Platelets: 137 10*3/uL — ABNORMAL LOW (ref 150–400)
RBC: 3.63 MIL/uL — ABNORMAL LOW (ref 4.22–5.81)
RDW: 12.3 % (ref 11.5–15.5)
WBC: 4.2 10*3/uL (ref 4.0–10.5)
nRBC: 1.2 % — ABNORMAL HIGH (ref 0.0–0.2)

## 2021-07-07 LAB — GLUCOSE, CAPILLARY
Glucose-Capillary: 161 mg/dL — ABNORMAL HIGH (ref 70–99)
Glucose-Capillary: 232 mg/dL — ABNORMAL HIGH (ref 70–99)
Glucose-Capillary: 171 mg/dL — ABNORMAL HIGH (ref 70–99)
Glucose-Capillary: 203 mg/dL — ABNORMAL HIGH (ref 70–99)

## 2021-07-07 LAB — RENAL FUNCTION PANEL
Albumin: 3.1 g/dL — ABNORMAL LOW (ref 3.5–5.0)
Anion gap: 6 (ref 5–15)
BUN: 18 mg/dL (ref 8–23)
CO2: 29 mmol/L (ref 22–32)
Calcium: 8.6 mg/dL — ABNORMAL LOW (ref 8.9–10.3)
Chloride: 99 mmol/L (ref 98–111)
Creatinine, Ser: 0.72 mg/dL (ref 0.61–1.24)
GFR, Estimated: >60 mL/min (ref >60)
Glucose, Bld: 165 mg/dL — ABNORMAL HIGH (ref 70–99)
Phosphorus: 3.6 mg/dL (ref 2.5–4.6)
Potassium: 4.2 mmol/L (ref 3.5–5.1)
Sodium: 134 mmol/L — ABNORMAL LOW (ref 135–145)

## 2021-07-07 LAB — MAGNESIUM: Magnesium: 2.1 mg/dL (ref 1.7–2.4)

## 2021-07-07 MED ORDER — VALACYCLOVIR HCL 500 MG PO TABS
1000.0000 mg | ORAL_TABLET | Freq: Three times a day (TID) | ORAL | Status: AC
  Administered 2021-07-07 – 2021-07-14 (×20): 1000 mg via ORAL
  Filled 2021-07-07 (×20): qty 2

## 2021-07-07 MED ORDER — VALACYCLOVIR HCL 500 MG PO TABS
500.0000 mg | ORAL_TABLET | Freq: Three times a day (TID) | ORAL | Status: DC
  Administered 2021-07-07: 500 mg via ORAL
  Filled 2021-07-07: qty 1

## 2021-07-07 MED ORDER — DEXAMETHASONE 4 MG PO TABS
4.0000 mg | ORAL_TABLET | Freq: Two times a day (BID) | ORAL | Status: DC
  Administered 2021-07-07 – 2021-07-15 (×16): 4 mg via ORAL
  Filled 2021-07-07 (×16): qty 1

## 2021-07-07 MED ORDER — INSULIN GLARGINE-YFGN 100 UNIT/ML ~~LOC~~ SOLN
20.0000 [IU] | Freq: Two times a day (BID) | SUBCUTANEOUS | Status: DC
Start: 2021-07-07 — End: 2021-07-09
  Administered 2021-07-07 – 2021-07-09 (×4): 20 [IU] via SUBCUTANEOUS
  Filled 2021-07-07 (×5): qty 0.2

## 2021-07-07 NOTE — Progress Notes (Signed)
PROGRESS NOTE  Benjamin Willis. XVQ:[id number] DOB: November 13, 1945   PCP: Horald Pollen, MD  Patient is from: Home.  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 Lorenso Courier presenting with generalized weakness, BLE weakness, tremors and fall when he tried to get out of the bed, and admitted for fall at home, left leg weakness and generalized weakness.  Vitals are stable.  Tmax 99.4.  Labs with stable CMP, anemia and leukopenia.  UA not suggestive for UTI.  CT head and MRI brain with for metastatic deposits in brain (progressed since his MRI on 05/13/2021) increased surrounding edema in the right parietal lobe with evidence of prior hemorrhage with progression of hemorrhage into right cerebellar lesion, left occipital lesion and right parietal lesion.  Started on steroid.   Oncology, neurooncology and radiation oncology involved.  Ongoing discussion about treatment plan.   Noted to have shingles across right lower thoracic dermatomes.  Started on Valtrex    Subjective: Seen and examined earlier this morning.  No major events overnight of this morning.  He developed skin rash with blisters across his right lower chest and upper abdomen that looks like shingles.  Otherwise, no complaints.   Objective: Vitals:   07/06/21 0540 07/06/21 1318 07/06/21 2018 07/07/21 0444  BP: 134/74 135/73 (!) 141/97 128/87  Pulse: 67 70 71 64  Resp: 16 14  18   Temp: (!) 97.5 F (36.4 C) 98.1 F (36.7 C) 98.2 F (36.8 C) 98 F (36.7 C)  TempSrc: Oral Oral Oral Oral  SpO2: 97% 98% 95% 97%  Weight:      Height:        Examination:  GENERAL: No apparent distress.  Nontoxic. HEENT: MMM.  Vision and hearing grossly intact.  NECK: Supple.  No apparent JVD.  RESP:  No IWOB.  Fair aeration  bilaterally. CVS:  RRR. Heart sounds normal.  ABD/GI/GU: BS+. Abd soft, NTND.  MSK/EXT:  Moves extremities. No apparent deformity. No edema.  SKIN: Skin rash with blisters across right lower thoracic dermatomes NEURO: Awake and alert. Oriented to self, "hospital", city and state.  No apparent focal neuro deficit. PSYCH: Calm. Normal affect.  Procedures:  None  Microbiology summarized: [id number] and influenza PCR nonreactive.  Assessment and Plan: * Non-small cell lung cancer metastatic to brain [facility]) Patient presents with fall, bilateral leg weakness, left> right.  MRI brain with increased brain mets, edema, and evidence of prior hemorrhage with progression in the right cerebellar lesion, left occipital lesion or right parietal lesions.  Started on Decadron.  Motor 4/5 in BLE on my exam. -Oncology, neurooncology and radiation oncology involved. -Wean Decadron to 4 mg every 12 hours. -Continue home Keppra -Appreciate input by palliative care   Left leg weakness Likely from brain mets.  4/5 motor strength in BLE on my exam. -Decadron as above. -Continue PT/OT  Shingles rash Started Valtrex 1 g 3 times daily for 7 days Airborne and contact precaution  Fall at home, initial encounter Likely due to brain mets.  No significant injury -Fall precaution -PT/OT  Cognitive impairment Seems to have underlying cognitive impairment.  He is only oriented to self, city, state and "hospital" but not the name.  Not oriented to time.  This seems to be stable. -Reorientation  and delirium precaution  Anemia associated with chemotherapy Recent Labs    04/19/21 1331 05/16/21 1213 06/07/21 1106 06/28/21 1338 07/02/21 1142 07/03/21 0917 07/04/21 1412 07/05/21 0520 07/06/21 0531 07/07/21 0745  HGB 10.8* 11.5* 11.5* 11.2* 12.6* 12.5* 11.3* 11.2* 11.4* 11.8*  Hemoglobin seems to be at baseline. -Continue monitoring   Leukopenia with mild neutropenia Resolved.  Glaucoma Continue home  eyedrops  Diabetes mellitus type 2 in nonobese (HCC) Controlled with hyperglycemia.  A1c 6.7% on 05/01/2021. Recent Labs  Lab 07/06/21 1128 07/06/21 1623 07/06/21 2130 07/07/21 0746 07/07/21 1112  GLUCAP 218* 263* 212* 161* 232*  -Continue SSI-moderate -Increase Semglee from from 15 to 20 units twice daily -Continue NovoLog 5 units 3 times daily -Anticipate improvement in hyperglycemia as we decrease the steroid -Continue holding home glipizide, metformin and Januvia   Hypertension BP stable off home antihypertensive meds. -Continue holding home antihypertensive meds  Dyslipidemia associated with type 2 diabetes mellitus ([place]) - Continue home statin.  Goals of care, counseling/discussion Patient with metastatic brain cancer as above.  Poor long-term prognosis.  -Palliative care following.  Remains full code        DVT prophylaxis:  Place and maintain sequential compression device Start: 07/03/21 0759 SCDs Start: 07/02/21 1626  Code Status: Full code Family Communication: Updated patient's sister over the phone  Level of care: Med-Surg Status is: Inpatient Remains inpatient appropriate because: Leg weakness, fall at home and progressive brain metastasis from lung cancer    Final disposition: SNF once cleared by consultants    Consultants:  Oncology Neuro oncology Radiation oncology Palliative medicine   Sch Meds:  Scheduled Meds:  dexamethasone  4 mg Oral E99B   folic acid  1 mg Oral Daily   insulin aspart  0-15 Units Subcutaneous TID WC   insulin aspart  5 Units Subcutaneous TID WC   insulin glargine-yfgn  20 Units Subcutaneous BID   latanoprost  1 drop Both Eyes QHS   levETIRAcetam  1,000 mg Oral BID   pantoprazole  40 mg Oral Daily   rosuvastatin  20 mg Oral Daily   valACYclovir  1,000 mg Oral TID   Continuous Infusions:   PRN Meds:.acetaminophen **OR** acetaminophen, ondansetron **OR** ondansetron (ZOFRAN) IV,  oxyCODONE  Antimicrobials: Anti-infectives (From admission, onward)    Start     Dose/Rate Route Frequency Ordered Stop   07/07/21 2200  valACYclovir (VALTREX) tablet 1,000 mg        1,000 mg Oral 3 times daily 07/07/21 1516 07/14/21 1559   07/07/21 1600  valACYclovir (VALTREX) tablet 500 mg  Status:  Discontinued        500 mg Oral 3 times daily 07/07/21 1133 07/07/21 1516        I have personally reviewed the following labs and images: CBC: Recent Labs  Lab 07/02/21 1142 07/03/21 0917 07/04/21 1412 07/05/21 0520 07/06/21 0531 07/07/21 0745  WBC 3.3* 3.1* 3.3* 2.7* 3.5* 4.2  NEUTROABS 3.1 2.7  --  1.4* 0.8*  --   HGB 12.6* 12.5* 11.3* 11.2* 11.4* 11.8*  HCT 38.8* 38.3* 34.1* 34.5* 34.4* 35.6*  MCV 98.7 98.2 98.6 99.1 98.9 98.1  PLT 244 230 181 164 155 137*   BMP &GFR Recent Labs  Lab 07/02/21 1142 07/03/21 0917 07/05/21 0520 07/07/21 0745  NA 141 140 136 134*  K 3.6 4.0 4.2 4.2  CL 104 107 103 99  CO2 30 23 27 29   GLUCOSE 114* 223* 194* 165*  BUN 12 16 20 18   CREATININE 0.77  0.83 0.72 0.72  CALCIUM 9.5 9.1 8.8* 8.6*  MG  --  1.9 2.2 2.1  PHOS  --  5.3* 4.0 3.6   Estimated Creatinine Clearance: 73.1 mL/min (by C-G formula based on SCr of 0.72 mg/dL). Liver & Pancreas: Recent Labs  Lab 07/02/21 1142 07/03/21 0917 07/05/21 0520 07/07/21 0745  AST 38  --   --   --   ALT 40  --   --   --   ALKPHOS 80  --   --   --   BILITOT 0.3  --   --   --   PROT 7.0  --   --   --   ALBUMIN 3.9 3.3* 3.2* 3.1*   No results for input(s): LIPASE, AMYLASE in the last 168 hours. No results for input(s): AMMONIA in the last 168 hours. Diabetic: Recent Labs    07/05/21 0520  HGBA1C 8.3*   Recent Labs  Lab 07/06/21 1128 07/06/21 1623 07/06/21 2130 07/07/21 0746 07/07/21 1112  GLUCAP 218* 263* 212* 161* 232*   Cardiac Enzymes: No results for input(s): CKTOTAL, CKMB, CKMBINDEX, TROPONINI in the last 168 hours. No results for input(s): PROBNP in the last 8760  hours. Coagulation Profile: No results for input(s): INR, PROTIME in the last 168 hours. Thyroid Function Tests: No results for input(s): TSH, T4TOTAL, FREET4, T3FREE, THYROIDAB in the last 72 hours.  Lipid Profile: No results for input(s): CHOL, HDL, LDLCALC, TRIG, CHOLHDL, LDLDIRECT in the last 72 hours. Anemia Panel: No results for input(s): VITAMINB12, FOLATE, FERRITIN, TIBC, IRON, RETICCTPCT in the last 72 hours. Urine analysis:    Component Value Date/Time   COLORURINE YELLOW 07/02/2021 [place] 07/02/2021 1123   LABSPEC 1.020 07/02/2021 1123   PHURINE 6.0 07/02/2021 1123   GLUCOSEU NEGATIVE 07/02/2021 1123   HGBUR NEGATIVE 07/02/2021 1123   BILIRUBINUR NEGATIVE 07/02/2021 1123   KETONESUR NEGATIVE 07/02/2021 1123   PROTEINUR 30 (A) 07/02/2021 1123   NITRITE NEGATIVE 07/02/2021 1123   LEUKOCYTESUR NEGATIVE 07/02/2021 1123   Sepsis Labs: Invalid input(s): PROCALCITONIN, [place]  Microbiology: Recent Results (from the past 240 hour(s))  Resp Panel by RT-PCR (Flu A&B, Covid) Nasopharyngeal Swab     Status: None   Collection Time: 07/02/21  3:43 PM   Specimen: Nasopharyngeal Swab; Nasopharyngeal(NP) swabs in vial transport medium  Result Value Ref Range Status   SARS Coronavirus 2 by RT PCR NEGATIVE NEGATIVE Final    Comment: (NOTE) SARS-CoV-2 target nucleic acids are NOT DETECTED.  The SARS-CoV-2 RNA is generally detectable in upper respiratory specimens during the acute phase of infection. The lowest concentration of SARS-CoV-2 viral copies this assay can detect is 138 copies/mL. A negative result does not preclude SARS-Cov-2 infection and should not be used as the sole basis for treatment or other patient management decisions. A negative result may occur with  improper specimen collection/handling, submission of specimen other than nasopharyngeal swab, presence of viral mutation(s) within the areas targeted by this assay, and inadequate number  of viral copies(<138 copies/mL). A negative result must be combined with clinical observations, patient history, and epidemiological information. The expected result is Negative.  Fact Sheet for Patients:  EntrepreneurPulse.com.au  Fact Sheet for Healthcare Providers:  IncredibleEmployment.be  This test is no t yet approved or cleared by the [place] FDA and  has been authorized for detection and/or diagnosis of SARS-CoV-2 by FDA under an Emergency Use Authorization (EUA). This EUA will remain  in effect (meaning this test can be used)  for the duration of the COVID-19 declaration under Section 564(b)(1) of the Act, 21 U.S.C.section 360bbb-3(b)(1), unless the authorization is terminated  or revoked sooner.       Influenza A by PCR NEGATIVE NEGATIVE Final   Influenza B by PCR NEGATIVE NEGATIVE Final    Comment: (NOTE) The Xpert Xpress SARS-CoV-2/FLU/RSV plus assay is intended as an aid in the diagnosis of influenza from Nasopharyngeal swab specimens and should not be used as a sole basis for treatment. Nasal washings and aspirates are unacceptable for Xpert Xpress SARS-CoV-2/FLU/RSV testing.  Fact Sheet for Patients: EntrepreneurPulse.com.au  Fact Sheet for Healthcare Providers: IncredibleEmployment.be  This test is not yet approved or cleared by the [place] FDA and has been authorized for detection and/or diagnosis of SARS-CoV-2 by FDA under an Emergency Use Authorization (EUA). This EUA will remain in effect (meaning this test can be used) for the duration of the COVID-19 declaration under Section 564(b)(1) of the Act, 21 U.S.C. section 360bbb-3(b)(1), unless the authorization is terminated or revoked.  Performed at [facility], [place] [address] [place], [place] [address]     Radiology Studies: No results found.     Benjamin Willis T. [place]  If 7PM-7AM,  please contact night-coverage [email or web address] 07/07/2021, 3:17 PM

## 2021-07-07 NOTE — Assessment & Plan Note (Addendum)
Completed  Valtrex 1 g 3 times daily for 7 days on 3/5 ?Airborne and droplet precaution in a negative pressure room until lesions crust ?Started low dose gabapentin and benadryl cream.  ?

## 2021-07-08 ENCOUNTER — Inpatient Hospital Stay: Payer: Medicare HMO | Attending: Hematology and Oncology

## 2021-07-08 DIAGNOSIS — Z7189 Other specified counseling: Secondary | ICD-10-CM | POA: Diagnosis not present

## 2021-07-08 DIAGNOSIS — Z5111 Encounter for antineoplastic chemotherapy: Secondary | ICD-10-CM | POA: Insufficient documentation

## 2021-07-08 DIAGNOSIS — Z79899 Other long term (current) drug therapy: Secondary | ICD-10-CM | POA: Insufficient documentation

## 2021-07-08 DIAGNOSIS — D6481 Anemia due to antineoplastic chemotherapy: Secondary | ICD-10-CM | POA: Insufficient documentation

## 2021-07-08 DIAGNOSIS — Z5112 Encounter for antineoplastic immunotherapy: Secondary | ICD-10-CM | POA: Insufficient documentation

## 2021-07-08 DIAGNOSIS — C7931 Secondary malignant neoplasm of brain: Secondary | ICD-10-CM | POA: Insufficient documentation

## 2021-07-08 DIAGNOSIS — W19XXXA Unspecified fall, initial encounter: Secondary | ICD-10-CM | POA: Diagnosis not present

## 2021-07-08 DIAGNOSIS — C3490 Malignant neoplasm of unspecified part of unspecified bronchus or lung: Secondary | ICD-10-CM | POA: Diagnosis not present

## 2021-07-08 DIAGNOSIS — C3411 Malignant neoplasm of upper lobe, right bronchus or lung: Secondary | ICD-10-CM | POA: Insufficient documentation

## 2021-07-08 LAB — GLUCOSE, CAPILLARY
Glucose-Capillary: 181 mg/dL — ABNORMAL HIGH (ref 70–99)
Glucose-Capillary: 166 mg/dL — ABNORMAL HIGH (ref 70–99)
Glucose-Capillary: 205 mg/dL — ABNORMAL HIGH (ref 70–99)
Glucose-Capillary: 194 mg/dL — ABNORMAL HIGH (ref 70–99)

## 2021-07-08 NOTE — Progress Notes (Signed)
PROGRESS NOTE  Benjamin Willis. KNL:[id number] DOB: 09-24-1945   PCP: Horald Pollen, MD  Patient is from: Home.  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 Lorenso Courier presenting with generalized weakness, BLE weakness, tremors and fall when he tried to get out of the bed, and admitted for fall at home, left leg weakness and generalized weakness.  Vitals are stable.  Tmax 99.4.  Labs with stable CMP, anemia and leukopenia.  UA not suggestive for UTI.  CT head and MRI brain with for metastatic deposits in brain (progressed since his MRI on 05/13/2021) increased surrounding edema in the right parietal lobe with evidence of prior hemorrhage with progression of hemorrhage into right cerebellar lesion, left occipital lesion and right parietal lesion.  Started on steroid.   Oncology, neurooncology and radiation oncology involved.  Ongoing discussion about treatment plan.   Noted to have shingles across right lower thoracic dermatomes.  Started on Valtrex on 3/5.    Subjective: Seen and examined earlier this morning.  No major events overnight of this morning.  No complaints but not a great historian.  He denies pain, shortness of breath, nausea, vomiting or abdominal pain    Objective: Vitals:   07/06/21 2018 07/07/21 0444 07/07/21 2115 07/08/21 0529  BP: (!) 141/97 128/87 140/82 (!) 142/93  Pulse: 71 64 67 74  Resp:  18 18 18   Temp: 98.2 F (36.8 C) 98 F (36.7 C) 98 F (36.7 C) 97.8 F (36.6 C)  TempSrc: Oral Oral Oral Oral  SpO2: 95% 97% 97% 100%  Weight:      Height:        Examination:  GENERAL: No apparent distress.  Nontoxic. HEENT: MMM.  Vision and hearing grossly intact.  NECK: Supple.  No apparent JVD.  RESP:  No IWOB.  Fair aeration bilaterally. CVS:   RRR. Heart sounds normal.  ABD/GI/GU: BS+. Abd soft, NTND.  MSK/EXT:  Moves extremities. No apparent deformity. No edema.  SKIN: Pustular lesion along the right T9-T10 dermatome to midline NEURO: Awake and alert. Oriented to self, city, state and "hospital".  No apparent focal neuro deficit. PSYCH: Calm. Normal affect.   Procedures:  None  Microbiology summarized: [id number] and influenza PCR nonreactive.  Assessment and Plan: * Non-small cell lung cancer metastatic to brain [facility]) Patient presents with fall, bilateral leg weakness, left> right.  MRI brain with increased brain mets, edema, and evidence of prior hemorrhage with progression in the right cerebellar lesion, left occipital lesion or right parietal lesions.  Started on Decadron.  Motor 4/5 in BLE on my exam. -Oncology, neurooncology and radiation oncology involved. -Continue Decadron to 4 mg every 12 hours. -Continue home Keppra -Appreciate input by palliative care   Left leg weakness Likely from brain mets.  4/5 motor strength in BLE on my exam. -Decadron as above. -Continue PT/OT  Shingles rash Started Valtrex 1 g 3 times daily for 7 days on 3/5 Airborne and droplet precaution in a negative pressure room  Fall at home, initial encounter Likely due to brain mets.  No significant injury -Fall precaution -PT/OT  Cognitive impairment Seems to have underlying cognitive impairment.  He is only oriented to self, city, state and "hospital" but not the name.  Not oriented to  time.  This seems to be stable. -Reorientation and delirium precaution  Anemia associated with chemotherapy Recent Labs    04/19/21 1331 05/16/21 1213 06/07/21 1106 06/28/21 1338 07/02/21 1142 07/03/21 0917 07/04/21 1412 07/05/21 0520 07/06/21 0531 07/07/21 0745  HGB 10.8* 11.5* 11.5* 11.2* 12.6* 12.5* 11.3* 11.2* 11.4* 11.8*  Hemoglobin seems to be at baseline. -Continue monitoring   Leukopenia with mild  neutropenia Resolved.  Glaucoma Continue home eyedrops  Diabetes mellitus type 2 in nonobese (HCC) Controlled with hyperglycemia.  A1c 6.7% on 05/01/2021. Recent Labs  Lab 07/07/21 0746 07/07/21 1112 07/07/21 1647 07/07/21 2107 07/08/21 0754  GLUCAP 161* 232* 171* 203* 181*  -Continue SSI-moderate -Continue Semglee at 20 units twice daily -Continue NovoLog 5 units 3 times daily -Anticipate improvement in hyperglycemia as we go down on Decadron -Continue holding home glipizide, metformin and Januvia   Hypertension BP stable off home antihypertensive meds. -Continue holding home antihypertensive meds  Dyslipidemia associated with type 2 diabetes mellitus ([place]) - Continue home statin.  Goals of care, counseling/discussion Patient with metastatic brain cancer as above.  Poor long-term prognosis.  -Palliative care following.  Remains full code        DVT prophylaxis:  Place and maintain sequential compression device Start: 07/03/21 0759 SCDs Start: 07/02/21 1626  Code Status: Full code Family Communication: Updated patient's sister over the phone on 3/5 Level of care: Med-Surg Status is: Inpatient Remains inpatient appropriate because: Leg weakness, fall at home and progressive brain metastasis from lung cancer    Final disposition: SNF once cleared by consultants    Consultants:  Oncology Neuro oncology Radiation oncology Palliative medicine   Sch Meds:  Scheduled Meds:  dexamethasone  4 mg Oral T06Y   folic acid  1 mg Oral Daily   insulin aspart  0-15 Units Subcutaneous TID WC   insulin aspart  5 Units Subcutaneous TID WC   insulin glargine-yfgn  20 Units Subcutaneous BID   latanoprost  1 drop Both Eyes QHS   levETIRAcetam  1,000 mg Oral BID   pantoprazole  40 mg Oral Daily   rosuvastatin  20 mg Oral Daily   valACYclovir  1,000 mg Oral TID   Continuous Infusions:   PRN Meds:.acetaminophen **OR** acetaminophen, ondansetron **OR** ondansetron  (ZOFRAN) IV, oxyCODONE  Antimicrobials: Anti-infectives (From admission, onward)    Start     Dose/Rate Route Frequency Ordered Stop   07/07/21 2200  valACYclovir (VALTREX) tablet 1,000 mg        1,000 mg Oral 3 times daily 07/07/21 1516 07/14/21 1559   07/07/21 1600  valACYclovir (VALTREX) tablet 500 mg  Status:  Discontinued        500 mg Oral 3 times daily 07/07/21 1133 07/07/21 1516        I have personally reviewed the following labs and images: CBC: Recent Labs  Lab 07/02/21 1142 07/03/21 0917 07/04/21 1412 07/05/21 0520 07/06/21 0531 07/07/21 0745  WBC 3.3* 3.1* 3.3* 2.7* 3.5* 4.2  NEUTROABS 3.1 2.7  --  1.4* 0.8*  --   HGB 12.6* 12.5* 11.3* 11.2* 11.4* 11.8*  HCT 38.8* 38.3* 34.1* 34.5* 34.4* 35.6*  MCV 98.7 98.2 98.6 99.1 98.9 98.1  PLT 244 230 181 164 155 137*   BMP &GFR Recent Labs  Lab 07/02/21 1142 07/03/21 0917 07/05/21 0520 07/07/21 0745  NA 141 140 136 134*  K 3.6 4.0 4.2 4.2  CL 104 107 103 99  CO2 30 23 27 29   GLUCOSE 114* 223* 194* 165*  BUN  12 16 20 18   CREATININE 0.77 0.83 0.72 0.72  CALCIUM 9.5 9.1 8.8* 8.6*  MG  --  1.9 2.2 2.1  PHOS  --  5.3* 4.0 3.6   Estimated Creatinine Clearance: 73.1 mL/min (by C-G formula based on SCr of 0.72 mg/dL). Liver & Pancreas: Recent Labs  Lab 07/02/21 1142 07/03/21 0917 07/05/21 0520 07/07/21 0745  AST 38  --   --   --   ALT 40  --   --   --   ALKPHOS 80  --   --   --   BILITOT 0.3  --   --   --   PROT 7.0  --   --   --   ALBUMIN 3.9 3.3* 3.2* 3.1*   No results for input(s): LIPASE, AMYLASE in the last 168 hours. No results for input(s): AMMONIA in the last 168 hours. Diabetic: No results for input(s): HGBA1C in the last 72 hours.  Recent Labs  Lab 07/07/21 0746 07/07/21 1112 07/07/21 1647 07/07/21 2107 07/08/21 0754  GLUCAP 161* 232* 171* 203* 181*   Cardiac Enzymes: No results for input(s): CKTOTAL, CKMB, CKMBINDEX, TROPONINI in the last 168 hours. No results for input(s): PROBNP  in the last 8760 hours. Coagulation Profile: No results for input(s): INR, PROTIME in the last 168 hours. Thyroid Function Tests: No results for input(s): TSH, T4TOTAL, FREET4, T3FREE, THYROIDAB in the last 72 hours.  Lipid Profile: No results for input(s): CHOL, HDL, LDLCALC, TRIG, CHOLHDL, LDLDIRECT in the last 72 hours. Anemia Panel: No results for input(s): VITAMINB12, FOLATE, FERRITIN, TIBC, IRON, RETICCTPCT in the last 72 hours. Urine analysis:    Component Value Date/Time   COLORURINE YELLOW 07/02/2021 [place] 07/02/2021 1123   LABSPEC 1.020 07/02/2021 1123   PHURINE 6.0 07/02/2021 1123   GLUCOSEU NEGATIVE 07/02/2021 1123   HGBUR NEGATIVE 07/02/2021 1123   BILIRUBINUR NEGATIVE 07/02/2021 1123   KETONESUR NEGATIVE 07/02/2021 1123   PROTEINUR 30 (A) 07/02/2021 1123   NITRITE NEGATIVE 07/02/2021 1123   LEUKOCYTESUR NEGATIVE 07/02/2021 1123   Sepsis Labs: Invalid input(s): PROCALCITONIN, [place]  Microbiology: Recent Results (from the past 240 hour(s))  Resp Panel by RT-PCR (Flu A&B, Covid) Nasopharyngeal Swab     Status: None   Collection Time: 07/02/21  3:43 PM   Specimen: Nasopharyngeal Swab; Nasopharyngeal(NP) swabs in vial transport medium  Result Value Ref Range Status   SARS Coronavirus 2 by RT PCR NEGATIVE NEGATIVE Final    Comment: (NOTE) SARS-CoV-2 target nucleic acids are NOT DETECTED.  The SARS-CoV-2 RNA is generally detectable in upper respiratory specimens during the acute phase of infection. The lowest concentration of SARS-CoV-2 viral copies this assay can detect is 138 copies/mL. A negative result does not preclude SARS-Cov-2 infection and should not be used as the sole basis for treatment or other patient management decisions. A negative result may occur with  improper specimen collection/handling, submission of specimen other than nasopharyngeal swab, presence of viral mutation(s) within the areas targeted by this assay, and  inadequate number of viral copies(<138 copies/mL). A negative result must be combined with clinical observations, patient history, and epidemiological information. The expected result is Negative.  Fact Sheet for Patients:  EntrepreneurPulse.com.au  Fact Sheet for Healthcare Providers:  IncredibleEmployment.be  This test is no t yet approved or cleared by the [place] FDA and  has been authorized for detection and/or diagnosis of SARS-CoV-2 by FDA under an Emergency Use Authorization (EUA). This EUA will remain  in effect (  meaning this test can be used) for the duration of the COVID-19 declaration under Section 564(b)(1) of the Act, 21 U.S.C.section 360bbb-3(b)(1), unless the authorization is terminated  or revoked sooner.       Influenza A by PCR NEGATIVE NEGATIVE Final   Influenza B by PCR NEGATIVE NEGATIVE Final    Comment: (NOTE) The Xpert Xpress SARS-CoV-2/FLU/RSV plus assay is intended as an aid in the diagnosis of influenza from Nasopharyngeal swab specimens and should not be used as a sole basis for treatment. Nasal washings and aspirates are unacceptable for Xpert Xpress SARS-CoV-2/FLU/RSV testing.  Fact Sheet for Patients: EntrepreneurPulse.com.au  Fact Sheet for Healthcare Providers: IncredibleEmployment.be  This test is not yet approved or cleared by the [place] FDA and has been authorized for detection and/or diagnosis of SARS-CoV-2 by FDA under an Emergency Use Authorization (EUA). This EUA will remain in effect (meaning this test can be used) for the duration of the COVID-19 declaration under Section 564(b)(1) of the Act, 21 U.S.C. section 360bbb-3(b)(1), unless the authorization is terminated or revoked.  Performed at [facility], [place] [address], [place], [place] [address]     Radiology Studies: No results found.     Benjamin Willis  If 7PM-7AM, please contact night-coverage [email or web address] 07/08/2021, 11:27 AM

## 2021-07-08 NOTE — Progress Notes (Signed)
Physical Therapy Treatment ?Patient Details ?Name: Benjamin Willis. ?MRN: [id number] ?DOB: 1946-03-04 ?Today's Date: 07/08/2021 ? ? 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 ? ?  ?PT Comments  ? ? General Comments: AxO x 3 very pleasant and following all commands.  C/O fatigue and hunger. Assisted OOB required much effort.  General transfer comment: assist to power up and steady, mild posterior lean initially.  25% VC's on proper hand placement.  Asisst to control stand to sit due to weakness/fatigue.  Unsteady esp with turns and back gait steps.  HIGH FALL RISK.  General Gait Details: required assist fro balance and limited distance of 18 feet due to weakness/fatigue.  Required a seated rest break in hallway.  Pt tends to push walker too far to front and present with shuffled, sluggish steps.  HIGH FALL RISK anteriorly. ?Pt will need ST Rehab at SNF prior to safely retuning home. ?  ?Recommendations for follow up therapy are one component of a multi-disciplinary discharge planning process, led by the attending physician.  Recommendations may be updated based on patient status, additional functional criteria and insurance authorization. ? ?Follow Up Recommendations ? Skilled nursing-short term rehab (<3 hours/day) ?  ?  ?Assistance Recommended at Discharge Frequent or constant Supervision/Assistance  ?Patient can return home with the following Assistance with cooking/housework;Direct supervision/assist for  medications management;Direct supervision/assist for financial management;Help with stairs or ramp for entrance;Assist for transportation;A lot of help with bathing/dressing/bathroom;A lot of help with walking and/or transfers ?  ?Equipment Recommendations ? None recommended by PT  ?  ?Recommendations for Other Services   ? ? ?  ?Precautions / Restrictions Precautions ?Precautions: Fall ?Precaution Comments: fell just prior to admission, family denies other falls in past 6 months ?Restrictions ?Weight Bearing Restrictions: No  ?  ? ?Mobility ? Bed Mobility ?Overal bed mobility: Needs Assistance ?Bed Mobility: Supine to Sit, Sit to Supine ?  ?  ?Supine to sit: Min guard, Min assist ?Sit to supine: Min assist ?  ?General bed mobility comments: required light assist OOB but then increased assist back to bed due to weakness/fatigue. ?  ? ?Transfers ?Overall transfer level: Needs assistance ?Equipment used: Rolling walker (2 wheels) ?Transfers: Sit to/from Stand, Bed to chair/wheelchair/BSC ?Sit to Stand: Min assist ?Stand pivot transfers: Mod assist ?  ?  ?  ?  ?General transfer comment: assist to power up and steady, mild posterior lean initially.  25% VC's on proper hand placement.  Asisst to control stand to sit due to weakness/fatigue.  Unsteady esp with turns and back gait steps.  HIGH FALL RISK. ?  ? ?Ambulation/Gait ?Ambulation/Gait assistance: Min guard, Min assist ?Gait Distance (Feet): 32 Feet (16 feet x 2) ?Assistive device: Rolling walker (2 wheels) ?Gait Pattern/deviations: Step-to pattern, Decreased step length - right, Decreased step length - left, Trunk flexed ?Gait velocity: decr ?  ?  ?General Gait Details: required assist fro balance and limited distance of 18 feet due to weakness/fatigue.  Required a seated rest break in hallway.  Pt tends to push  walker too far to front and present with shuffled, sluggish steps.  HIGH FALL RISK anteriorly. ? ? ?Stairs ?  ?  ?  ?  ?  ? ? ?Wheelchair Mobility ?   ? ?Modified Rankin (Stroke Patients Only) ?  ? ? ?  ?Balance   ?  ?  ?  ?  ?  ?  ?  ?  ?  ?  ?  ?  ?  ?  ?  ?  ?  ?  ?  ? ?  ?Cognition Arousal/Alertness: Awake/alert ?Behavior During Therapy: [facility] for tasks assessed/performed ?Overall Cognitive Status: Within Functional Limits for tasks assessed ?  ?  ?  ?  ?  ?  ?  ?  ?  ?  ?  ?  ?  ?  ?  ?  ?General Comments: AxO x 3 very pleasant and following all commands.  C/O fatigue and hunger. ?  ?  ? ?  ?Exercises   ? ?  ?General Comments   ?  ?  ? ?Pertinent Vitals/Pain Pain Assessment ?Pain Assessment: No/denies pain  ? ? ?Home Living   ?  ?  ?  ?  ?  ?  ?  ?  ?  ?   ?  ?Prior Function    ?  ?  ?   ? ?PT Goals (current goals can now be found in the care plan section) Progress towards PT goals: Progressing toward goals ? ?  ?Frequency ? ? ? Min 2X/week ? ? ? ?  ?PT Plan Current plan remains appropriate  ? ? ?Co-evaluation   ?  ?  ?  ?  ? ?  ?AM-PAC PT "6 Clicks" Mobility   ?Outcome Measure ? Help needed turning from your back to your side while in a flat bed without using bedrails?: A Little ?Help needed moving from lying on your back to sitting on the side of a flat bed without using bedrails?: A Little ?Help needed moving to and from a bed to a chair (including a wheelchair)?: A Lot ?Help needed standing up from a chair using your arms (e.g., wheelchair or bedside chair)?: A Lot ?Help needed to walk in hospital room?: A Lot ?Help needed climbing 3-5 steps with a railing? : A Lot ?6 Click Score: 14 ? ?  ?End of Session Equipment Utilized During Treatment: Gait belt ?Activity Tolerance: Patient tolerated treatment well ?Patient left: with chair alarm set;with call bell/phone within reach;in bed ?Nurse Communication: Mobility status ?PT Visit Diagnosis: Unsteadiness on feet (R26.81);Muscle weakness (generalized) (M62.81);Difficulty in walking, not elsewhere classified (R26.2);History of falling (Z91.81) ?  ? ? ?Time: 4193-7902 ?PT Time Calculation (min) (ACUTE ONLY): 36  min ? ?Charges:  $Gait Training: 8-22 mins ?$Therapeutic Activity: 8-22 mins          ?          ? ?Rica Koyanagi  PTA ?Acute  Rehabilitation Services ?Pager      [phone] ?Office      [phone] ? ? ?

## 2021-07-08 NOTE — Plan of Care (Signed)
Continue current POC 

## 2021-07-08 NOTE — TOC Initial Note (Signed)
Transition of Care (TOC) - Initial/Assessment Note  ? ?Patient Details  ?Name: Benjamin Willis. ?MRN: [id number] ?Date of Birth: October 08, 1945 ? ?Transition of Care (TOC) CM/SW Contact:    ?Sherie Don, LCSW ?Phone Number: ?07/08/2021, 2:06 PM ? ?Clinical Narrative: CSW spoke with patient regarding PT's recommendation for rehab. Patient is agreeable to SNF. FL2 done; PASRR received. Initial referral faxed out. TOC awaiting bed offers. ? ?Expected Discharge Plan: [place] ?Barriers to Discharge: Continued Medical Work up, Ship broker, SNF Pending bed offer ? ?Patient Goals and CMS Choice ?Patient states their goals for this hospitalization and ongoing recovery are:: return home ?CMS Medicare.gov Compare Post Acute Care list provided to:: Patient ?Choice offered to / list presented to : Patient ? ?Expected Discharge Plan and Services ?Expected Discharge Plan: [place] ?In-house Referral: Clinical Social Work ?Post Acute Care Choice: [place] ?Living arrangements for the past 2 months: [place]           ?DME Arranged: N/A ?DME Agency: NA ? ?Prior Living Arrangements/Services ?Living arrangements for the past 2 months: [place] ?Lives with:: Siblings (sister) ?Patient language and need for interpreter reviewed:: Yes ?Do you feel safe going back to the place where you live?: Yes      ?Need for Family Participation in Patient Care: Yes (Comment) ?Care giver support system in place?: Yes (comment) ?Criminal Activity/Legal Involvement Pertinent to Current Situation/Hospitalization: No - Comment as needed ? ?Activities of Daily Living ?Home Assistive Devices/Equipment: [place] Rile (specify type) ?ADL Screening (condition at time of admission) ?Patient's cognitive ability adequate to safely complete daily activities?: No ?Is the patient deaf or have difficulty hearing?: No ?Does the patient have difficulty seeing, even when wearing glasses/contacts?:  No ?Does the patient have difficulty concentrating, remembering, or making decisions?: Yes ?Patient able to express need for assistance with ADLs?: Yes ?Does the patient have difficulty dressing or bathing?: Yes ?Independently performs ADLs?: No ?Communication: Independent ?Dressing (OT): Needs assistance ?Is this a change from baseline?: Pre-admission baseline ?Grooming: Needs assistance ?Is this a change from baseline?: Pre-admission baseline ?Feeding: Independent ?Bathing: Needs assistance ?Is this a change from baseline?: Pre-admission baseline ?Toileting: Needs assistance ?Is this a change from baseline?: Change from baseline, expected to last <3 days ?In/Out Bed: Needs assistance ?Is this a change from baseline?: Change from baseline, expected to last <3 days ?Walks in Home: Needs assistance ?Is this a change from baseline?: Pre-admission baseline ?Does the patient have difficulty walking or climbing stairs?: Yes ?Weakness of Legs: Both ?Weakness of Arms/Hands: None ? ?Emotional Assessment ?Orientation: : Oriented to Self, Oriented to Place, Oriented to Situation ?Alcohol / Substance Use: Not Applicable ?Psych Involvement: No (comment) ? ?Admission diagnosis:  Metastasis to brain [facility]) [C79.31] ?Patient Active Problem List  ? Diagnosis Date Noted  ? Shingles rash 07/07/2021  ? Cognitive impairment 07/04/2021  ? Fall at home, initial encounter 07/03/2021  ? Left leg weakness 07/03/2021  ? Leukopenia with mild neutropenia 07/02/2021  ? Anemia associated with chemotherapy 07/02/2021  ? Non-small cell lung cancer metastatic to brain [facility]) 04/01/2021  ? Port-A-Cath in place 12/14/2020  ? Brain metastases ([place])   ? Palliative care by specialist   ? Metastatic lung cancer (metastasis from lung to other site) [facility]) 10/16/2020  ? Diabetes mellitus type 2 in nonobese ([place]) 10/16/2020  ? Glaucoma 10/16/2020  ? Atherosclerosis of aorta ([place]) 05/31/2020  ? Primary cancer of right upper lobe of lung ([place]) 02/07/2019  ? Lung  nodule 02/02/2019  ?  Hypertension 05/24/2018  ? Dyslipidemia 05/24/2018  ? Dyslipidemia associated with type 2 diabetes mellitus ([place]) 06/09/2017  ? Goals of care, counseling/discussion 01/01/2017  ? ?PCP:  Horald Pollen, MD ?Pharmacy:   ?[facility] [place] [facility] [address] ?[place] [address] ?Phone: [phone] Fax: [phone] ? ?Readmission Risk Interventions ?Readmission Risk Prevention Plan 07/05/2021 01/10/2021  ?Transportation Screening Complete Complete  ?PCP or Specialist Appt within 3-5 Days Complete Complete  ?[place] or Home Care Consult Complete Complete  ?Social Work Consult for [place] Planning/Counseling Complete Complete  ?Palliative Care Screening Complete Not Applicable  ?Medication Review [employer]) Complete Complete  ?Some recent data might be hidden  ? ?

## 2021-07-08 NOTE — [place] FL2 (Signed)
?[place] MEDICAID FL2 LEVEL OF CARE SCREENING TOOL  ?  ? ?IDENTIFICATION  ?Patient Name: ?Benjamin Willis. Birthdate: 10-24-1945 Sex: male Admission Date (Current Location): ?07/02/2021  ?[place] and [place] Number: ? [place] ?[id number] K Facility and Address:  ?[facility],  [address] [place], [place] ?     Provider Number: ?[id number]  ?Attending Physician Name and Address:  ?Mercy Riding, MD ? Relative Name and Phone Number:  ?Jonn Shingles (sister) Ph: [phone] ?   ?Current Level of Care: ?Hospital Recommended Level of Care: ?[place] Prior Approval Number: ?  ? ?Date Approved/Denied: ?  PASRR Number: ?[id number] ? ?Discharge Plan: ?SNF ?  ? ?Current Diagnoses: ?Patient Active Problem List  ? Diagnosis Date Noted  ? Shingles rash 07/07/2021  ? Cognitive impairment 07/04/2021  ? Fall at home, initial encounter 07/03/2021  ? Left leg weakness 07/03/2021  ? Leukopenia with mild neutropenia 07/02/2021  ? Anemia associated with chemotherapy 07/02/2021  ? Non-small cell lung cancer metastatic to brain [facility]) 04/01/2021  ? Port-A-Cath in place 12/14/2020  ? Brain metastases ([place])   ? Palliative care by specialist   ? Metastatic lung cancer (metastasis from lung to other site) [facility]) 10/16/2020  ? Diabetes mellitus type 2 in nonobese ([place]) 10/16/2020  ? Glaucoma 10/16/2020  ? Atherosclerosis of aorta ([place]) 05/31/2020  ? Primary cancer of right upper lobe of lung ([place]) 02/07/2019  ? Lung nodule 02/02/2019  ? Hypertension 05/24/2018  ? Dyslipidemia 05/24/2018  ? Dyslipidemia associated with type 2 diabetes mellitus ([place]) 06/09/2017  ? Goals of care, counseling/discussion 01/01/2017  ? ? ?Orientation RESPIRATION BLADDER Height & Weight   ?  ?Self, Situation, Place ? Normal Incontinent Weight: 157 lb 10.1 oz (71.5 kg) ?Height:  5\' 6"  (167.6 cm)  ?BEHAVIORAL SYMPTOMS/MOOD NEUROLOGICAL BOWEL NUTRITION STATUS  ? (N/A)  (N/A) Continent Diet (Carb modified)  ?AMBULATORY STATUS  COMMUNICATION OF NEEDS Skin   ?Limited Assist Verbally Other (Comment) (Rash: chest, abdomen, back; Blisters: abdomen, back) ?  ?  ?  ?    ?     ?     ? ? ?Personal Care Assistance Level of Assistance  ?Bathing, Feeding, Dressing Bathing Assistance: Limited assistance ?Feeding assistance: Limited assistance ?Dressing Assistance: Maximum assistance (Patient needs maximum assistance for lower body.) ?   ? ?Functional Limitations Info  ?Sight, Hearing, Speech Sight Info: Adequate ?Hearing Info: Adequate ?Speech Info: Adequate  ? ? ?SPECIAL CARE FACTORS FREQUENCY  ?PT (By licensed PT), OT (By licensed OT)   ?  ?PT Frequency: 5x's/week ?OT Frequency: 5x's/week ?  ?  ?  ?   ? ? ?Contractures Contractures Info: Not present  ? ? ?Additional Factors Info  ?Code Status, Isolation Precautions, Insulin Sliding Scale, Allergies Code Status Info: Full ?Allergies Info: NKA ?  ?Insulin Sliding Scale Info: See discharge summary ?Isolation Precautions Info: Patient has shingles. ?   ? ?Current Medications (07/08/2021):  This is the current hospital active medication list ?Current Facility-Administered Medications  ?Medication Dose Route Frequency Provider Last Rate Last Admin  ? acetaminophen (TYLENOL) tablet 650 mg  650 mg Oral Q6H PRN Mercy Riding, MD   650 mg at 07/04/21 2100  ? Or  ? acetaminophen (TYLENOL) suppository 650 mg  650 mg Rectal Q6H PRN Gonfa, Taye T, MD      ? dexamethasone (DECADRON) tablet 4 mg  4 mg Oral Q12H Wendee Beavers T, MD   4 mg at 07/08/21 0845  ? folic acid (FOLVITE) tablet 1  mg  1 mg Oral Daily Wendee Beavers T, MD   1 mg at 07/08/21 0845  ? insulin aspart (novoLOG) injection 0-15 Units  0-15 Units Subcutaneous TID WC Mercy Riding, MD   3 Units at 07/08/21 1236  ? insulin aspart (novoLOG) injection 5 Units  5 Units Subcutaneous TID WC Mercy Riding, MD   5 Units at 07/08/21 1236  ? insulin glargine-yfgn (SEMGLEE) injection 20 Units  20 Units Subcutaneous BID Mercy Riding, MD   20 Units at 07/08/21 0843  ?  latanoprost (XALATAN) 0.005 % ophthalmic solution 1 drop  1 drop Both Eyes QHS Mercy Riding, MD   1 drop at 07/07/21 2253  ? levETIRAcetam (KEPPRA) tablet 1,000 mg  1,000 mg Oral BID Wendee Beavers T, MD   1,000 mg at 07/08/21 0845  ? ondansetron (ZOFRAN) tablet 4 mg  4 mg Oral Q6H PRN Mercy Riding, MD      ? Or  ? ondansetron (ZOFRAN) injection 4 mg  4 mg Intravenous Q6H PRN Gonfa, Taye T, MD      ? oxyCODONE (Oxy IR/ROXICODONE) immediate release tablet 5 mg  5 mg Oral Q4H PRN Wendee Beavers T, MD   5 mg at 07/05/21 1308  ? pantoprazole (PROTONIX) EC tablet 40 mg  40 mg Oral Daily Wendee Beavers T, MD   40 mg at 07/08/21 0845  ? rosuvastatin (CRESTOR) tablet 20 mg  20 mg Oral Daily Wendee Beavers T, MD   20 mg at 07/08/21 0845  ? valACYclovir (VALTREX) tablet 1,000 mg  1,000 mg Oral TID Wendee Beavers T, MD   1,000 mg at 07/08/21 0845  ? ? ? ?Discharge Medications: ?Please see discharge summary for a list of discharge medications. ? ?Relevant Imaging Results: ? ?Relevant Lab Results: ? ? ?Additional Information ?SSN: [id number] ? ?Sherie Don, LCSW ? ? ? ? ?

## 2021-07-08 NOTE — Care Management Important Message (Signed)
Important Message ? ?Patient Details IM Letter given to the Patient ?Name: Benjamin Willis. ?MRN: [id number] ?Date of Birth: 05/05/1946 ? ? ?Medicare Important Message Given:  Yes ? ? ? ? ?Kerin Salen ?07/08/2021, 2:19 PM ?

## 2021-07-09 ENCOUNTER — Other Ambulatory Visit: Payer: Medicare HMO | Admitting: Internal Medicine

## 2021-07-09 DIAGNOSIS — Z7189 Other specified counseling: Secondary | ICD-10-CM | POA: Diagnosis not present

## 2021-07-09 DIAGNOSIS — C3490 Malignant neoplasm of unspecified part of unspecified bronchus or lung: Secondary | ICD-10-CM | POA: Diagnosis not present

## 2021-07-09 DIAGNOSIS — W19XXXA Unspecified fall, initial encounter: Secondary | ICD-10-CM | POA: Diagnosis not present

## 2021-07-09 DIAGNOSIS — D6481 Anemia due to antineoplastic chemotherapy: Secondary | ICD-10-CM | POA: Diagnosis not present

## 2021-07-09 LAB — CBC
HCT: 34.6 % — ABNORMAL LOW (ref 39.0–52.0)
Hemoglobin: 11.4 g/dL — ABNORMAL LOW (ref 13.0–17.0)
MCH: 32.2 pg (ref 26.0–34.0)
MCHC: 32.9 g/dL (ref 30.0–36.0)
MCV: 97.7 fL (ref 80.0–100.0)
Platelets: 156 10*3/uL (ref 150–400)
RBC: 3.54 MIL/uL — ABNORMAL LOW (ref 4.22–5.81)
RDW: 12.3 % (ref 11.5–15.5)
WBC: 6.5 10*3/uL (ref 4.0–10.5)
nRBC: 1.4 % — ABNORMAL HIGH (ref 0.0–0.2)

## 2021-07-09 LAB — RENAL FUNCTION PANEL
Albumin: 3.1 g/dL — ABNORMAL LOW (ref 3.5–5.0)
Anion gap: 6 (ref 5–15)
BUN: 24 mg/dL — ABNORMAL HIGH (ref 8–23)
CO2: 28 mmol/L (ref 22–32)
Calcium: 8.6 mg/dL — ABNORMAL LOW (ref 8.9–10.3)
Chloride: 102 mmol/L (ref 98–111)
Creatinine, Ser: 0.83 mg/dL (ref 0.61–1.24)
GFR, Estimated: >60 mL/min (ref >60)
Glucose, Bld: 125 mg/dL — ABNORMAL HIGH (ref 70–99)
Phosphorus: 4.1 mg/dL (ref 2.5–4.6)
Potassium: 3.9 mmol/L (ref 3.5–5.1)
Sodium: 136 mmol/L (ref 135–145)

## 2021-07-09 LAB — MAGNESIUM: Magnesium: 2 mg/dL (ref 1.7–2.4)

## 2021-07-09 LAB — GLUCOSE, CAPILLARY
Glucose-Capillary: 118 mg/dL — ABNORMAL HIGH (ref 70–99)
Glucose-Capillary: 205 mg/dL — ABNORMAL HIGH (ref 70–99)
Glucose-Capillary: 243 mg/dL — ABNORMAL HIGH (ref 70–99)
Glucose-Capillary: 232 mg/dL — ABNORMAL HIGH (ref 70–99)

## 2021-07-09 MED ORDER — INSULIN GLARGINE-YFGN 100 UNIT/ML ~~LOC~~ SOLN
22.0000 [IU] | Freq: Two times a day (BID) | SUBCUTANEOUS | Status: DC
  Administered 2021-07-09 – 2021-07-13 (×9): 22 [IU] via SUBCUTANEOUS
  Filled 2021-07-09 (×11): qty 0.22

## 2021-07-09 NOTE — Progress Notes (Signed)
Physical Therapy Treatment ?Patient Details ?Name: Benjamin Willis. ?MRN: [id number] ?DOB: October 06, 1945 ?Today's Date: 07/09/2021 ? ? 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 ? ?  ?PT Comments  ? ? General Comments: AxO x 2 intermittent confused to current sitauation.  Pleasant and following all directions but easily slips into impaired cognition and he uses humor to conceal. General bed mobility comments: OOB in recliner.  General transfer comment: 75% VC's on proper hand placeemnt to avoid pulling up on walker.  Increased assist this session rising from lower level recliner.  Also asissted with a toilet transfer with 75% VC's on turn completion, targeting and hand placement with stand to sit.  Pt bradykenesia.  HIGH FALL RISK.  General Gait Details: 75% VC's on proper walker to self distance, upright posture and safety with turns.  Pt present with poor forwarf flex posture, tendancy to push walker too far to front and sluggish/shuffled/short steps.  HIGH FALL RISK.Pt will need ST Rehab at SNF prior to safely returning home.    ?Recommendations for follow up therapy are one component of a multi-disciplinary discharge planning process, led by the attending physician.  Recommendations may be updated based on patient status, additional functional criteria and insurance authorization. ? ?Follow Up Recommendations ? Skilled nursing-short term rehab (<3 hours/day) ?  ?  ?Assistance Recommended at Discharge Frequent or  constant Supervision/Assistance  ?Patient can return home with the following Assistance with cooking/housework;Direct supervision/assist for medications management;Direct supervision/assist for financial management;Help with stairs or ramp for entrance;Assist for transportation;A lot of help with bathing/dressing/bathroom;A lot of help with walking and/or transfers ?  ?Equipment Recommendations ? None recommended by PT  ?  ?Recommendations for Other Services   ? ? ?  ?Precautions / Restrictions Precautions ?Precautions: Fall ?Precaution Comments: fell just prior to admission, family denies other falls in past 6 months ?Restrictions ?Weight Bearing Restrictions: No  ?  ? ?Mobility ? Bed Mobility ?  ?  ?  ?  ?  ?  ?  ?General bed mobility comments: OOB in recliner ?  ? ?Transfers ?Overall transfer level: Needs assistance ?Equipment used: Rolling walker (2 wheels) ?Transfers: Sit to/from Stand ?Sit to Stand: Mod assist ?  ?  ?  ?  ?  ?General transfer comment: 75% VC's on proper hand placeemnt to avoid pulling up on walker.  Increased assist this session rising from lower level recliner.  Also asissted with a toilet transfer with 75% VC's on turn completion, targeting and hand placement with stand to sit.  Pt bradykenesia.  HIGH FALL RISK. ?  ? ?Ambulation/Gait ?Ambulation/Gait assistance: Min assist, Mod assist ?Gait Distance (Feet): 27 Feet ?Assistive device: Rolling walker (2 wheels) ?Gait Pattern/deviations: Step-to pattern, Decreased step length - right, Decreased step length - left, Trunk flexed ?Gait velocity: decreased ?  ?  ?General Gait Details: 75% VC's on proper walker to self distance, upright posture and safety with turns.  Pt present with poor forwarf flex posture, tendancy to push walker too far to front and sluggish/shuffled/short steps.  HIGH  FALL RISK. ? ? ?Stairs ?  ?  ?  ?  ?  ? ? ?Wheelchair Mobility ?  ? ?Modified Rankin (Stroke Patients Only) ?  ? ? ?  ?Balance   ?  ?  ?  ?  ?  ?  ?  ?  ?  ?  ?   ?  ?  ?  ?  ?  ?  ?  ?  ? ?  ?Cognition Arousal/Alertness: Awake/alert ?Behavior During Therapy: [facility] for tasks assessed/performed ?Overall Cognitive Status: No family/caregiver present to determine baseline cognitive functioning ?  ?  ?  ?  ?  ?  ?  ?  ?  ?  ?  ?  ?  ?  ?  ?  ?General Comments: AxO x 2 intermittent confused to current sitauation.  Pleasant and following all directions but easily slips into impaired cognition and he uses humor to conceal. ?  ?  ? ?  ?Exercises   ? ?  ?General Comments   ?  ?  ? ?Pertinent Vitals/Pain Pain Assessment ?Pain Assessment: Faces ?Faces Pain Scale: Hurts a little bit ?Pain Location: L knee ?Pain Descriptors / Indicators: Guarding ?Pain Intervention(s): Monitored during session, Repositioned  ? ? ?Home Living   ?  ?  ?  ?  ?  ?  ?  ?  ?  ?   ?  ?Prior Function    ?  ?  ?   ? ?PT Goals (current goals can now be found in the care plan section) Progress towards PT goals: Progressing toward goals ? ?  ?Frequency ? ? ? Min 2X/week ? ? ? ?  ?PT Plan Current plan remains appropriate  ? ? ?Co-evaluation   ?  ?  ?  ?  ? ?  ?AM-PAC PT "6 Clicks" Mobility   ?Outcome Measure ? Help needed turning from your back to your side while in a flat bed without using bedrails?: A Little ?Help needed moving from lying on your back to sitting on the side of a flat bed without using bedrails?: A Little ?Help needed moving to and from a bed to a chair (including a wheelchair)?: A Lot ?Help needed standing up from a chair using your arms (e.g., wheelchair or bedside chair)?: A Lot ?Help needed to walk in hospital room?: A Lot ?Help needed climbing 3-5 steps with a railing? : Total ?6 Click Score: 13 ? ?  ?End of Session Equipment Utilized During Treatment: Gait belt ?Activity Tolerance: Patient tolerated treatment well ?Patient left: with chair alarm set;with call bell/phone within reach;in bed ?Nurse Communication: Mobility status ?PT Visit Diagnosis: Unsteadiness on feet (R26.81);Muscle weakness  (generalized) (M62.81);Difficulty in walking, not elsewhere classified (R26.2);History of falling (Z91.81) ?  ? ? ?Time: 1450-1520 ?PT Time Calculation (min) (ACUTE ONLY): 30 min ? ?Charges:  $Gait Training: 8-22 mins ?$Therapeutic Activity: 8-22 mins          ?          ? ?{Carlise Stofer  PTA ?Acute  Rehabilitation Services ?Pager      [phone] ?Office      [phone] ? ? ?

## 2021-07-09 NOTE — Progress Notes (Signed)
PROGRESS NOTE  Benjamin Willis. OMV:[id number] DOB: 1945/06/27   PCP: Horald Pollen, MD  Patient is from: Home.  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 Lorenso Courier presenting with generalized weakness, BLE weakness, tremors and fall when he tried to get out of the bed, and admitted for fall at home, left leg weakness and generalized weakness.  Vitals are stable.  Tmax 99.4.  Labs with stable CMP, anemia and leukopenia.  UA not suggestive for UTI.  CT head and MRI brain with for metastatic deposits in brain (progressed since his MRI on 05/13/2021) increased surrounding edema in the right parietal lobe with evidence of prior hemorrhage with progression of hemorrhage into right cerebellar lesion, left occipital lesion and right parietal lesion.  Started on steroid.   Oncology, neurooncology and radiation oncology involved.  Patient was discussed on tumor board meeting.  Given improvement with steroid, the recommendation is to continue Decadron 4 mg daily and arrange outpatient repeat MRI.   Therapy recommended SNF.  However, patient developed shingles across right lower chest and abdomen.  Started on Valtrex and airborne precaution on 3/5.  Medically stable for discharge if SNF can take him with airborne precaution or the lesions crust and he comes off airborne precaution.     Subjective: Seen and examined earlier this morning.  No major events overnight of this morning.  No complaints other than some itching.  He denies pain, shortness of breath, GI or UTI symptoms.  Patient is not a great historian due to cognitive impairment   Objective: Vitals:   07/08/21 0529 07/08/21 1517 07/08/21 2119 07/09/21 0520  BP: (!) 142/93 135/75 123/70 131/68  Pulse: 74 66 66 (!) 59  Resp:  18 14 16 16   Temp: 97.8 F (36.6 C) 97.9 F (36.6 C) 98.3 F (36.8 C) 98.3 F (36.8 C)  TempSrc: Oral Oral Oral Oral  SpO2: 100% 97% 99% 96%  Weight:      Height:        Examination:  GENERAL: No apparent distress.  Nontoxic. HEENT: MMM.  Vision and hearing grossly intact.  NECK: Supple.  No apparent JVD.  RESP:  No IWOB.  Fair aeration bilaterally. CVS:  RRR. Heart sounds normal.  ABD/GI/GU: BS+. Abd soft, NTND.  MSK/EXT:  Moves extremities. No apparent deformity. No edema.  SKIN: Pustular blister along the right T9-T10 dermatome not crossing midline NEURO: Awake and alert. Oriented to self, "hospital", city and state.  No apparent focal neuro deficit.  Motor 4/5 in BLE. PSYCH: Calm. Normal affect.   Procedures:  None  Microbiology summarized: [id number] and influenza PCR nonreactive.  Assessment and Plan: * Non-small cell lung cancer metastatic to brain [facility]) Patient presents with fall, bilateral leg weakness, left> right.  MRI brain with increased brain mets, edema, and evidence of prior hemorrhage with progression in the right cerebellar lesion, left occipital lesion or right parietal lesions.  Started on Decadron.  Motor 4/5 in BLE on my exam. -Oncology, neurooncology and radiation oncology involved.   -Per neurooncology-Decadron 4 mg every 12 hours and outpatient follow-up for repeat MRI -Continue home Keppra -Appreciate input by palliative care   Left leg weakness Likely from brain mets.  4/5 motor strength in BLE on my exam. -Decadron  as above. -Continue PT/OT  Shingles rash Started Valtrex 1 g 3 times daily for 7 days on 3/5 Airborne and droplet precaution in a negative pressure room until lesions crust  Fall at home, initial encounter Likely due to brain mets.  No significant injury -Fall precaution -PT/OT  Cognitive impairment Seems to have underlying cognitive impairment.  He is only oriented to self, city, state and "hospital" but not the name.  Not  oriented to time.  This seems to be stable. -Reorientation and delirium precaution  Anemia associated with chemotherapy Recent Labs    05/16/21 1213 06/07/21 1106 06/28/21 1338 07/02/21 1142 07/03/21 0917 07/04/21 1412 07/05/21 0520 07/06/21 0531 07/07/21 0745 07/09/21 0331  HGB 11.5* 11.5* 11.2* 12.6* 12.5* 11.3* 11.2* 11.4* 11.8* 11.4*  Hemoglobin seems to be at baseline. -Continue monitoring   Leukopenia with mild neutropenia Resolved.  Glaucoma Continue home eyedrops  Diabetes mellitus type 2 in nonobese (HCC) Controlled with hyperglycemia.  A1c 6.7% on 05/01/2021. Recent Labs  Lab 07/08/21 1215 07/08/21 1745 07/08/21 2128 07/09/21 0810 07/09/21 1301  GLUCAP 166* 205* 194* 118* 205*  -Continue SSI-moderate -Increase Semglee from 20 to 22 units twice daily -Continue NovoLog 5 units 3 times daily -Continue holding home glipizide, metformin and Januvia   Hypertension BP stable off home antihypertensive meds. -Continue holding home antihypertensive meds  Dyslipidemia associated with type 2 diabetes mellitus ([place]) - Continue home statin.  Goals of care, counseling/discussion Patient with metastatic brain cancer as above.  Poor long-term prognosis.  -Palliative care signed off.  Recommended palliative follow-up at SNF        DVT prophylaxis:  Place and maintain sequential compression device Start: 07/03/21 0759 SCDs Start: 07/02/21 1626  Code Status: Full code Family Communication: Updated patient's sister over the phone Level of care: Med-Surg Status is: Inpatient Remains inpatient appropriate because: Leg weakness, fall at home and progressive brain metastasis from lung cancer    Final disposition: SNF once cleared by consultants    Consultants:  Oncology Neuro oncology Radiation oncology Palliative medicine   Sch Meds:  Scheduled Meds:  dexamethasone  4 mg Oral W10X   folic acid  1 mg Oral Daily   insulin aspart  0-15 Units  Subcutaneous TID WC   insulin aspart  5 Units Subcutaneous TID WC   insulin glargine-yfgn  22 Units Subcutaneous BID   latanoprost  1 drop Both Eyes QHS   levETIRAcetam  1,000 mg Oral BID   pantoprazole  40 mg Oral Daily   rosuvastatin  20 mg Oral Daily   valACYclovir  1,000 mg Oral TID   Continuous Infusions:   PRN Meds:.acetaminophen **OR** acetaminophen, ondansetron **OR** ondansetron (ZOFRAN) IV, oxyCODONE  Antimicrobials: Anti-infectives (From admission, onward)    Start     Dose/Rate Route Frequency Ordered Stop   07/07/21 2200  valACYclovir (VALTREX) tablet 1,000 mg        1,000 mg Oral 3 times daily 07/07/21 1516 07/14/21 1559   07/07/21 1600  valACYclovir (VALTREX) tablet 500 mg  Status:  Discontinued        500 mg Oral 3 times daily 07/07/21 1133 07/07/21 1516        I have personally reviewed the following labs and images: CBC: Recent Labs  Lab 07/03/21 0917 07/04/21 1412 07/05/21 0520 07/06/21 0531 07/07/21 0745 07/09/21 0331  WBC 3.1* 3.3* 2.7* 3.5* 4.2 6.5  NEUTROABS 2.7  --  1.4* 0.8*  --   --   HGB 12.5* 11.3* 11.2* 11.4* 11.8* 11.4*  HCT 38.3* 34.1* 34.5* 34.4* 35.6* 34.6*  MCV 98.2 98.6 99.1 98.9 98.1 97.7  PLT 230 181 164 155 137* 156   BMP &GFR Recent Labs  Lab 07/03/21 0917 07/05/21 0520 07/07/21 0745 07/09/21 0331  NA 140 136 134* 136  K 4.0 4.2 4.2 3.9  CL 107 103 99 102  CO2 23 27 29 28   GLUCOSE 223* 194* 165* 125*  BUN 16 20 18  24*  CREATININE 0.83 0.72 0.72 0.83  CALCIUM 9.1 8.8* 8.6* 8.6*  MG 1.9 2.2 2.1 2.0  PHOS 5.3* 4.0 3.6 4.1   Estimated Creatinine Clearance: 70.5 mL/min (by C-G formula based on SCr of 0.83 mg/dL). Liver & Pancreas: Recent Labs  Lab 07/03/21 0917 07/05/21 0520 07/07/21 0745 07/09/21 0331  ALBUMIN 3.3* 3.2* 3.1* 3.1*   No results for input(s): LIPASE, AMYLASE in the last 168 hours. No results for input(s): AMMONIA in the last 168 hours. Diabetic: No results for input(s): HGBA1C in the last 72  hours.  Recent Labs  Lab 07/08/21 1215 07/08/21 1745 07/08/21 2128 07/09/21 0810 07/09/21 1301  GLUCAP 166* 205* 194* 118* 205*   Cardiac Enzymes: No results for input(s): CKTOTAL, CKMB, CKMBINDEX, TROPONINI in the last 168 hours. No results for input(s): PROBNP in the last 8760 hours. Coagulation Profile: No results for input(s): INR, PROTIME in the last 168 hours. Thyroid Function Tests: No results for input(s): TSH, T4TOTAL, FREET4, T3FREE, THYROIDAB in the last 72 hours.  Lipid Profile: No results for input(s): CHOL, HDL, LDLCALC, TRIG, CHOLHDL, LDLDIRECT in the last 72 hours. Anemia Panel: No results for input(s): VITAMINB12, FOLATE, FERRITIN, TIBC, IRON, RETICCTPCT in the last 72 hours. Urine analysis:    Component Value Date/Time   COLORURINE YELLOW 07/02/2021 [place] 07/02/2021 1123   LABSPEC 1.020 07/02/2021 1123   PHURINE 6.0 07/02/2021 1123   GLUCOSEU NEGATIVE 07/02/2021 1123   HGBUR NEGATIVE 07/02/2021 1123   BILIRUBINUR NEGATIVE 07/02/2021 1123   KETONESUR NEGATIVE 07/02/2021 1123   PROTEINUR 30 (A) 07/02/2021 1123   NITRITE NEGATIVE 07/02/2021 1123   LEUKOCYTESUR NEGATIVE 07/02/2021 1123   Sepsis Labs: Invalid input(s): PROCALCITONIN, [place]  Microbiology: Recent Results (from the past 240 hour(s))  Resp Panel by RT-PCR (Flu A&B, Covid) Nasopharyngeal Swab     Status: None   Collection Time: 07/02/21  3:43 PM   Specimen: Nasopharyngeal Swab; Nasopharyngeal(NP) swabs in vial transport medium  Result Value Ref Range Status   SARS Coronavirus 2 by RT PCR NEGATIVE NEGATIVE Final    Comment: (NOTE) SARS-CoV-2 target nucleic acids are NOT DETECTED.  The SARS-CoV-2 RNA is generally detectable in upper respiratory specimens during the acute phase of infection. The lowest concentration of SARS-CoV-2 viral copies this assay can detect is 138 copies/mL. A negative result does not preclude SARS-Cov-2 infection and should not be used as  the sole basis for treatment or other patient management decisions. A negative result may occur with  improper specimen collection/handling, submission of specimen other than nasopharyngeal swab, presence of viral mutation(s) within the areas targeted by this assay, and inadequate number of viral copies(<138 copies/mL). A negative result must be combined with clinical observations, patient history, and epidemiological information. The expected result is Negative.  Fact Sheet for Patients:  EntrepreneurPulse.com.au  Fact Sheet for Healthcare Providers:  IncredibleEmployment.be  This test is no t yet approved or cleared by the [place] FDA and  has been authorized for detection and/or diagnosis of SARS-CoV-2 by FDA under an Emergency Use Authorization (EUA).  This EUA will remain  in effect (meaning this test can be used) for the duration of the COVID-19 declaration under Section 564(b)(1) of the Act, 21 U.S.C.section 360bbb-3(b)(1), unless the authorization is terminated  or revoked sooner.       Influenza A by PCR NEGATIVE NEGATIVE Final   Influenza B by PCR NEGATIVE NEGATIVE Final    Comment: (NOTE) The Xpert Xpress SARS-CoV-2/FLU/RSV plus assay is intended as an aid in the diagnosis of influenza from Nasopharyngeal swab specimens and should not be used as a sole basis for treatment. Nasal washings and aspirates are unacceptable for Xpert Xpress SARS-CoV-2/FLU/RSV testing.  Fact Sheet for Patients: EntrepreneurPulse.com.au  Fact Sheet for Healthcare Providers: IncredibleEmployment.be  This test is not yet approved or cleared by the [place] FDA and has been authorized for detection and/or diagnosis of SARS-CoV-2 by FDA under an Emergency Use Authorization (EUA). This EUA will remain in effect (meaning this test can be used) for the duration of the COVID-19 declaration under Section 564(b)(1) of  the Act, 21 U.S.C. section 360bbb-3(b)(1), unless the authorization is terminated or revoked.  Performed at [facility], [place] [address] [place], [place] [address]     Radiology Studies: No results found.     [place] Spray T. Gantt  If 7PM-7AM, please contact night-coverage [email or web address] 07/09/2021, 2:11 PM

## 2021-07-09 NOTE — TOC Progression Note (Signed)
Transition of Care (TOC) - Progression Note  ? ?Patient Details  ?Name: Benjamin Willis. ?MRN: [id number] ?Date of Birth: [birth year] ? ?Transition of Care (TOC) CM/SW Contact  ?Sherie Don, LCSW ?Phone Number: ?07/09/2021, 1:54 PM ? ?Clinical Narrative: Patient's only bed offer is [facility]. Patient and sister, Jonn Shingles, are agreeable to bed. CSW confirmed bed acceptance with Claiborne Billings in admissions for [facility], but the patient will not be able to be admitted until a private bed becomes available or the patient's lesions crust so that he can share a room, whichever comes first. [facility] may have a bed tomorrow or the following day, so TOC will continue to follow up with the facility. ? ?Expected Discharge Plan: [place] ?Barriers to Discharge: Continued Medical Work up, Ship broker, SNF Pending bed offer ? ?Expected Discharge Plan and Services ?Expected Discharge Plan: [place] ?In-house Referral: Clinical Social Work ?Post Acute Care Choice: [place] ?Living arrangements for the past 2 months: [place]            ?DME Arranged: N/A ?DME Agency: NA ? ?Readmission Risk Interventions ?Readmission Risk Prevention Plan 07/05/2021 01/10/2021  ?Transportation Screening Complete Complete  ?PCP or Specialist Appt within 3-5 Days Complete Complete  ?[place] or Home Care Consult Complete Complete  ?Social Work Consult for [place] Planning/Counseling Complete Complete  ?Palliative Care Screening Complete Not Applicable  ?Medication Review Press photographer) Complete Complete  ?Some recent data might be hidden  ? ?

## 2021-07-09 NOTE — Progress Notes (Signed)
Occupational Therapy Treatment ?Patient Details ?Name: Benjamin Willis. ?MRN: [id number] ?DOB: 10/07/1945 ?Today's Date: 07/09/2021 ? ? 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 ?  ?OT comments ? Upon arrival patient bed linens soiled and condom catheter off in bed. Patient supervision level for bed mobility with heavy use of bed rails. Patient set up for upper body bathing and min A for standing balance with posterior lean against the bed. Patient's legs also sliding forward with OT needing to block to maintain standing positioning. Patient min A to stand pivot with walker to transfer to recliner chair, cues for safety. High fall risk, continue to recommend rehab at D/C.   ? ?Recommendations for follow up therapy are one component of a multi-disciplinary discharge planning process, led by the attending physician.  Recommendations may be updated based on patient status, additional functional criteria and insurance authorization. ?   ?Follow Up Recommendations ? Skilled nursing-short term rehab (<3 hours/day)  ?  ?Assistance Recommended at Discharge Frequent or constant Supervision/Assistance  ?Patient can return home with the following ? A little help with bathing/dressing/bathroom;A little help with walking and/or transfers;Direct supervision/assist for medications management;Direct supervision/assist for financial management;Assist for transportation;Assistance with cooking/housework;Help with stairs or ramp for  entrance ?  ?Equipment Recommendations ? Other (comment) (TBD)  ?  ?   ?Precautions / Restrictions Precautions ?Precautions: Fall ?Precaution Comments: fell just prior to admission, family denies other falls in past 6 months ?Restrictions ?Weight Bearing Restrictions: No  ? ? ?  ? ?Mobility Bed Mobility ?Overal bed mobility: Needs Assistance ?Bed Mobility: Supine to Sit ?  ?  ?Supine to sit: HOB elevated, Supervision ?  ?  ?General bed mobility comments: Increased time and heavy use of bed rails ?  ? ? ?  ?Balance Overall balance assessment: History of Falls, Needs assistance ?Sitting-balance support: Feet supported ?Sitting balance-Leahy Scale: Fair ?  ?  ?Standing balance support: Bilateral upper extremity supported, Single extremity supported ?Standing balance-Leahy Scale: Poor ?  ?  ?  ?  ?  ?  ?  ?  ?  ?  ?  ?  ?   ? ?ADL either performed or assessed with clinical judgement  ? ?ADL Overall ADL's : Needs assistance/impaired ?  ?  ?Grooming: Dance movement psychotherapist;Set up;Sitting ?  ?Upper Body Bathing: Set up;Sitting ?Upper Body Bathing Details (indicate cue type and reason): At edge of bed, able to maintain balance ?Lower Body Bathing: Minimal assistance;Sit to/from stand ?Lower Body Bathing Details (indicate cue type and reason): Patient leaning against bed frame and min A from OT to stabilize in standing. Patient able to wash upper/lower legs and perianal area ?  ?Upper Body Dressing Details (indicate cue type and reason): Assisted patient with donning clean gown ?  ?  ?Toilet Transfer: Minimal assistance;Stand-pivot;Cueing for safety;Rolling walker (2 wheels) ?Toilet Transfer Details (indicate cue type and reason): To recliner. Posterior lean, cues for safety as patient sits too quickly onto recliner ?  ?  ?  ?  ?Functional mobility during ADLs: Minimal assistance;Rolling walker (  2 wheels);Cueing for sequencing;Cueing for safety ?General ADL Comments: Patient is a high fall risk with posterior lean and feet sliding on  floor needing OT to block during initial sit to stand ?  ? ? ? ?Cognition Arousal/Alertness: Awake/alert ?Behavior During Therapy: [facility] for tasks assessed/performed ?Overall Cognitive Status: No family/caregiver present to determine baseline cognitive functioning ?  ?  ?  ?  ?  ?  ?  ?  ?  ?  ?  ?  ?  ?  ?  ?  ?General Comments: Patient knows hes in the hospital, incorrect month. States his birthday is in Feb when it is in Jan. ?  ?  ?   ?   ?   ?   ? ? ?Pertinent Vitals/ Pain       Pain Assessment ?Pain Assessment: No/denies pain ? ?   ?   ? ?Frequency ? Min 2X/week  ? ? ? ? ?  ?Progress Toward Goals ? ?OT Goals(current goals can now be found in the care plan section) ? Progress towards OT goals: Progressing toward goals ? ?Acute Rehab OT Goals ?Patient Stated Goal: go to nursing home ?OT Goal Formulation: With patient ?Time For Goal Achievement: 07/18/21 ?Potential to Achieve Goals: Good ?ADL Goals ?Pt Will Perform Grooming: standing;with supervision;with set-up ?Pt Will Perform Lower Body Dressing: with set-up;with min guard assist;sitting/lateral leans;sit to/from stand ?Pt Will Transfer to Toilet: with supervision;ambulating ?Pt Will Perform Toileting - Clothing Manipulation and hygiene: with min guard assist;sitting/lateral leans;sit to/from stand;with caregiver independent in assisting ?Pt/caregiver will Perform Home Exercise Program: Left upper extremity;Both right and left upper extremity;With theraband;With Supervision;With minimal assist;Increased strength;Increased ROM ?Additional ADL Goal #1: Pt will demonstrate improved mentation by scoring <5/10 on short blessed test and answering 4/4 safety questions from the [facility] correctly:   1. What do you do for yourself if you are sick with a cold.    2. What do you do if you burn yourself and the wound becomes infected.    3. What do you do if you experience severe chest pain and shortness of breath?   4. What number do you call in an emergency?  ?Plan Discharge  plan remains appropriate   ? ?   ?AM-PAC OT "6 Clicks" Daily Activity     ?Outcome Measure ? ? Help from another person eating meals?: A Little ?Help from another person taking care of personal grooming?: A Little ?Help from another person toileting, which includes using toliet, bedpan, or urinal?: A Lot ?Help from another person bathing (including washing, rinsing, drying)?: A Little ?Help from another person to put on and taking off regular upper body clothing?: A Little ?Help from another person to put on and taking off regular lower body clothing?: A Lot ?6 Click Score: 16 ? ?  ?End of Session Equipment Utilized During Treatment: Rolling walker (2 wheels);Gait belt ? ?OT Visit Diagnosis: Unsteadiness on feet (R26.81);History of falling (Z91.81);Muscle weakness (generalized) (M62.81);Other symptoms and signs involving cognitive function;Cognitive communication deficit (R41.841) ?Symptoms and signs involving cognitive functions: Other cerebrovascular disease ?  ?Activity Tolerance Patient tolerated treatment well ?  ?Patient Left in chair;with call bell/phone within reach;with chair alarm set ?  ?Nurse Communication Mobility status (condom cath off) ?  ? ?   ? ?Time: 2703-5009 ?OT Time Calculation (min): 20 min ? ?Charges: OT General Charges ?$OT Visit: 1 Visit ?OT Treatments ?$Self Care/Home Management : 8-22 mins ? ?Delbert Phenix OT ?OT pager: [phone] ? ? ?Rosemary Holms ?  07/09/2021, 1:09 PM ?

## 2021-07-09 NOTE — Consult Note (Signed)
[facility] CM Inpatient Consult ? ? ?07/09/2021 ? ?Dajion Florina Ou. ?1946/03/02 ?[id number] ? ?[facility] CM) ?  ?Patient chart reviewed with noted high risk score for unplanned readmission. Assessed for post hospital chronic care management needs with [facility] CM services. Per review, primary provider office offers chronic case management team and is listed for post hospital follow up and appointments. Per chart review, current recommendation is for SNF. No [facility] CM needs as needs will be met at SNF level of care. ? ?Of note, [facility] Care Management services does not replace or interfere with any services that are arranged by inpatient case management or social work.   ? ?Netta Cedars, MSN, RN ?[facility] Liaison ?Mobile Phone [phone]  ?Toll free office [phone]  ? ?

## 2021-07-10 DIAGNOSIS — C3490 Malignant neoplasm of unspecified part of unspecified bronchus or lung: Secondary | ICD-10-CM | POA: Diagnosis not present

## 2021-07-10 DIAGNOSIS — C7931 Secondary malignant neoplasm of brain: Secondary | ICD-10-CM | POA: Diagnosis not present

## 2021-07-10 LAB — GLUCOSE, CAPILLARY
Glucose-Capillary: 139 mg/dL — ABNORMAL HIGH (ref 70–99)
Glucose-Capillary: 151 mg/dL — ABNORMAL HIGH (ref 70–99)
Glucose-Capillary: 84 mg/dL (ref 70–99)
Glucose-Capillary: 130 mg/dL — ABNORMAL HIGH (ref 70–99)

## 2021-07-10 LAB — VARICELLA-ZOSTER BY PCR: Varicella-Zoster, PCR: POSITIVE — AB

## 2021-07-10 NOTE — TOC Progression Note (Signed)
Transition of Care (TOC) - Progression Note  ? ?Patient Details  ?Name: Benjamin Willis. ?MRN: [id number] ?Date of Birth: June 29, 1945 ? ?Transition of Care (TOC) CM/SW Contact  ?Sherie Don, LCSW ?Phone Number: ?07/10/2021, 10:03 AM ? ?Clinical Narrative: CSW called Juliann Pulse in admissions at [facility]. Per Juliann Pulse, there is no open private bed today and requested that CSW follow up tomorrow morning to see if one is available then. TOC to follow. ? ?Expected Discharge Plan: [place] ?Barriers to Discharge: Continued Medical Work up, Ship broker, SNF Pending bed offer ? ?Expected Discharge Plan and Services ?Expected Discharge Plan: [place] ?In-house Referral: Clinical Social Work ?Post Acute Care Choice: [place] ?Living arrangements for the past 2 months: [place]           ?DME Arranged: N/A ?DME Agency: NA ? ?Readmission Risk Interventions ?Readmission Risk Prevention Plan 07/05/2021 01/10/2021  ?Transportation Screening Complete Complete  ?PCP or Specialist Appt within 3-5 Days Complete Complete  ?[place] or Home Care Consult Complete Complete  ?Social Work Consult for [place] Planning/Counseling Complete Complete  ?Palliative Care Screening Complete Not Applicable  ?Medication Review Press photographer) Complete Complete  ?Some recent data might be hidden  ? ?

## 2021-07-10 NOTE — Progress Notes (Signed)
?Progress Note ? ? ?Patient: Benjamin Willis. ZDG:[id number] DOB: 1945-10-07 DOA: 07/02/2021     8 ?DOS: the patient was seen and examined on 07/10/2021 ?  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 Lorenso Courier presenting with generalized weakness, BLE weakness, tremors and fall when he tried to get out of the bed, ? ?CT head and MRI brain with for metastatic deposits in brain (progressed since his MRI on 05/13/2021) increased surrounding edema in the right parietal lobe with evidence of prior hemorrhage with progression of hemorrhage into right cerebellar lesion, left occipital lesion and right parietal lesion.  Started on steroid.   ?Oncology, neurooncology and radiation oncology involved.  Patient was discussed on tumor board meeting.  Given improvement with steroid, the recommendation is to continue Decadron 4 mg daily and arrange outpatient repeat MRI.  ? ?Therapy recommended SNF.  However, patient developed shingles across right lower chest and abdomen.  Started on Valtrex and airborne precaution on 3/5.  Medically stable for discharge if SNF can take him with airborne precaution or the lesions crust and he comes off airborne precaution. ? ? ? ?Assessment and Plan: ?* Non-small cell lung cancer metastatic to brain [facility]) ?Patient presents with fall, bilateral leg weakness, left> right.  MRI brain with increased brain mets, edema, and evidence of prior hemorrhage with progression in the right cerebellar lesion, left occipital lesion or right parietal lesions.  Started on Decadron.  Motor 4/5 in BLE on my exam. ?-Oncology, neurooncology and radiation oncology involved.   ?-Per neurooncology-Decadron 4 mg every 12 hours and outpatient follow-up for repeat MRI ?-Continue home Keppra ?-Appreciate input by palliative care ? ? ?Left leg weakness ?Likely from brain mets.  4/5 motor strength in BLE on my exam. ?-Decadron as  above. ?-Continue PT/OT ? ?Shingles rash ?Started Valtrex 1 g 3 times daily for 7 days on 3/5 ?Airborne and droplet precaution in a negative pressure room until lesions crust ? ?Fall at home, initial encounter ?Likely due to brain mets.  No significant injury ?-Fall precaution ?-PT/OT ? ?Cognitive impairment ?Seems to have underlying cognitive impairment.  He is only oriented times 3.  ?-Reorientation and delirium precaution ?-Check B 12.  ? ?Anemia associated with chemotherapy ?Recent Labs  ?  05/16/21 ?1213 06/07/21 ?1106 06/28/21 ?1338 07/02/21 ?1142 07/03/21 ?5956 07/04/21 ?1412 07/05/21 ?3875 07/06/21 ?6433 07/07/21 ?0745 07/09/21 ?0331  ?HGB 11.5* 11.5* 11.2* 12.6* 12.5* 11.3* 11.2* 11.4* 11.8* 11.4*  ?Hemoglobin at baseline. Last check 3/07. ? ? ?Leukopenia with mild neutropenia ?Resolved. ? ?Glaucoma ?Continue home eyedrops ? ?Diabetes mellitus type 2 in nonobese [facility]) ?Controlled with hyperglycemia.  A1c 6.7% on 05/01/2021. ?Recent Labs  ?Lab 07/08/21 ?1215 07/08/21 ?1745 07/08/21 ?2128 07/09/21 ?2951 07/09/21 ?1301  ?GLUCAP 166* 205* 194* 118* 205*  ?-Continue SSI-moderate ?-Increase Semglee  22 units twice daily ?-Continue NovoLog 5 units 3 times daily ?-Continue holding home glipizide, metformin and Januvia ? ? ?Hypertension ?BP stable off home antihypertensive meds. ?-Continue holding home antihypertensive meds ? ?Dyslipidemia associated with type 2 diabetes mellitus ([place]) ?- Continue home statin. ? ?Goals of care, counseling/discussion ?Patient with metastatic brain cancer as above.  Poor long-term prognosis.  ?-Palliative care signed off.  Recommended palliative follow-up at SNF ? ? ? ? ?  ? ?Subjective: he denies headaches, dizziness.  ? ?Physical Exam: ?Vitals:  ? 07/08/21 2119 07/09/21 0520 07/09/21 2115 07/10/21 8841  ?BP:  123/70 131/68 129/81 138/86  ?Pulse: 66 (!) 59 70 66  ?Resp: 16 16 16 16   ?Temp: 98.3 ?F (36.8 ?C) 98.3 ?F (36.8 ?C) 97.7 ?F (36.5 ?C) 97.7 ?F (36.5 ?C)  ?TempSrc: Oral Oral  Oral Oral  ?SpO2: 99% 96% 99% 99%  ?Weight:      ?Height:      ? ?General; NAD ?Lung; CTA ?Abdomen; soft, nt ?Skin; Zoster rash, blister.  ? ?Data Reviewed: ? ?Prior CBC and Bmet ? ?Family Communication: No family at bedside  ? ?Disposition: ?Status is: Inpatient ?Remains inpatient appropriate because: awaiting SNF, initially admitted with cerebral edema, worsening brain mets.  ? Planned Discharge Destination: Home with Home Health ? ? ? ?Time spent: 45 minutes ? ?Author: ?Elmarie Shiley, MD ?07/10/2021 2:04 PM ? ?For on call review [email or web address].  ?

## 2021-07-11 DIAGNOSIS — C3490 Malignant neoplasm of unspecified part of unspecified bronchus or lung: Secondary | ICD-10-CM | POA: Diagnosis not present

## 2021-07-11 DIAGNOSIS — C7931 Secondary malignant neoplasm of brain: Secondary | ICD-10-CM | POA: Diagnosis not present

## 2021-07-11 LAB — GLUCOSE, CAPILLARY
Glucose-Capillary: 105 mg/dL — ABNORMAL HIGH (ref 70–99)
Glucose-Capillary: 131 mg/dL — ABNORMAL HIGH (ref 70–99)
Glucose-Capillary: 313 mg/dL — ABNORMAL HIGH (ref 70–99)
Glucose-Capillary: 186 mg/dL — ABNORMAL HIGH (ref 70–99)

## 2021-07-11 LAB — VITAMIN B12: Vitamin B-12: 1971 pg/mL — ABNORMAL HIGH (ref 180–914)

## 2021-07-11 MED ORDER — GABAPENTIN 100 MG PO CAPS
100.0000 mg | ORAL_CAPSULE | Freq: Two times a day (BID) | ORAL | Status: DC
  Administered 2021-07-11 – 2021-07-15 (×9): 100 mg via ORAL
  Filled 2021-07-11 (×9): qty 1

## 2021-07-11 NOTE — TOC Progression Note (Signed)
Transition of Care (TOC) - Progression Note  ? ?Patient Details  ?Name: Benjamin Willis. ?MRN: [id number] ?Date of Birth: February 05, 1946 ? ?Transition of Care (TOC) CM/SW Contact  ?Sherie Don, LCSW ?Phone Number: ?07/11/2021, 9:59 AM ? ?Clinical Narrative: Per Juliann Pulse with [facility], the facility is unable to accept the patient today. TOC to follow. ? ?Expected Discharge Plan: [place] ?Barriers to Discharge: Continued Medical Work up, Ship broker, SNF Pending bed offer ? ?Expected Discharge Plan and Services ?Expected Discharge Plan: [place] ?In-house Referral: Clinical Social Work ?Post Acute Care Choice: [place] ?Living arrangements for the past 2 months: [place]           ?DME Arranged: N/A ?DME Agency: NA ? ?Readmission Risk Interventions ?Readmission Risk Prevention Plan 07/05/2021 01/10/2021  ?Transportation Screening Complete Complete  ?PCP or Specialist Appt within 3-5 Days Complete Complete  ?[place] or Home Care Consult Complete Complete  ?Social Work Consult for [place] Planning/Counseling Complete Complete  ?Palliative Care Screening Complete Not Applicable  ?Medication Review Press photographer) Complete Complete  ?Some recent data might be hidden  ? ?

## 2021-07-11 NOTE — Progress Notes (Signed)
Physical Therapy Treatment ?Patient Details ?Name: Benjamin Willis. ?MRN: [id number] ?DOB: 1945/12/01 ?Today's Date: 07/11/2021 ? ? 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 ? ?  ?PT Comments  ? ? General Comments: AxO x 2 intermittent confused to current sitauation.  Pleasant and following all directions but easily slips into impaired cognition and he uses humor to conceal.    Pleasant.  Cooperative.  Easy going. Assisted OOB to amb to bathroom required increased time.  Then assisted with amb in hallway.  General transfer comment: 75% VC's on proper hand placeemnt to avoid pulling up on walker.  Increased assist this session rising from lower level recliner.  Also asissted with a toilet transfer with 75% VC's on turn completion, targeting and hand placement with stand to sit.  Pt bradykenesia.  HIGH FALL RISK. General Gait Details: 75% VC's on proper walker to self distance, upright posture and safety with turns.  Pt present with poor forwarf flex posture, tendancy to push walker too far to front and sluggish/shuffled/short steps.  HIGH FALL RISK. ?Pt will need SNF.   ?Recommendations for follow up therapy are one component of a multi-disciplinary discharge planning process, led by the attending physician.  Recommendations may be updated based on patient status, additional functional criteria and insurance authorization. ? ?Follow Up Recommendations ? Skilled nursing-short term rehab (<3 hours/day) ?  ?   ?Assistance Recommended at Discharge Frequent or constant Supervision/Assistance  ?Patient can return home with the following Assistance with cooking/housework;Direct supervision/assist for medications management;Direct supervision/assist for financial management;Help with stairs or ramp for entrance;Assist for transportation;A lot of help with bathing/dressing/bathroom;A lot of help with walking and/or transfers ?  ?Equipment Recommendations ? None recommended by PT  ?  ?Recommendations for Other Services   ? ? ?  ?Precautions / Restrictions Precautions ?Precautions: Fall ?Precaution Comments: fell just prior to admission, family denies other falls in past 6 months  ?  ? ?Mobility ? Bed Mobility ?Overal bed mobility: Needs Assistance ?Bed Mobility: Supine to Sit ?  ?  ?Supine to sit: HOB elevated, Supervision ?  ?  ?General bed mobility comments: increased time but self able ?  ? ?Transfers ?Overall transfer level: Needs assistance ?Equipment used: Rolling walker (2 wheels) ?Transfers: Sit to/from Stand ?Sit to Stand: Mod assist, Min assist ?  ?  ?  ?  ?  ?General transfer comment: 75% VC's on proper hand placeemnt to avoid pulling up on walker.  Increased assist this session rising from lower level recliner.  Also asissted with a toilet transfer with 75% VC's on turn completion, targeting and hand placement with stand to sit.  Pt bradykenesia.  HIGH FALL RISK. ?  ? ?Ambulation/Gait ?Ambulation/Gait assistance: Min assist, Mod assist ?Gait Distance (Feet): 29 Feet ?Assistive device: Rolling walker (2 wheels) ?Gait Pattern/deviations: Step-to pattern, Decreased step length - right, Decreased step length - left, Trunk flexed ?Gait velocity: decreased ?  ?  ?General Gait Details: 75% VC's on proper walker to self distance, upright posture and safety with turns.  Pt present  with poor forwarf flex posture, tendancy to push walker too far to front and sluggish/shuffled/short steps.  HIGH FALL RISK. ? ? ?Stairs ?  ?  ?   ?  ?  ? ? ?Wheelchair Mobility ?  ? ?Modified Rankin (Stroke Patients Only) ?  ? ? ?  ?Balance   ?  ?  ?  ?  ?  ?  ?  ?  ?  ?  ?  ?  ?  ?  ?  ?  ?  ?  ?  ? ?  ?Cognition Arousal/Alertness: Awake/alert ?  ?Overall Cognitive Status: No family/caregiver present to determine baseline cognitive functioning ?  ?  ?  ?  ?  ?  ?  ?  ?  ?  ?  ?  ?  ?  ?  ?  ?General Comments: AxO x 2 intermittent confused to current sitauation.  Pleasant and following all directions but easily slips into impaired cognition and he uses humor to conceal.    Pleasant.  Cooperative.  Easy going. ?  ?  ? ?  ?Exercises   ? ?  ?General Comments   ?  ?  ? ?Pertinent Vitals/Pain Pain Assessment ?Pain Assessment: No/denies pain  ? ? ?Home Living   ?  ?  ?  ?  ?  ?  ?  ?  ?  ?   ?  ?Prior Function    ?  ?  ?   ? ?PT Goals (current goals can now be found in the care plan section) Progress towards PT goals: Progressing toward goals ? ?  ?Frequency ? ? ? Min 2X/week ? ? ? ?  ?PT Plan Current plan remains appropriate  ? ? ?Co-evaluation   ?  ?  ?  ?  ? ?  ?AM-PAC PT "6 Clicks" Mobility   ?Outcome Measure ? Help needed turning from your back to your side while in a flat bed without using bedrails?: A Little ?Help needed moving from lying on your back to sitting on the side of a flat bed without using bedrails?: A Little ?Help needed moving to and from a bed to a chair (including a wheelchair)?: A Little ?Help needed standing up from a chair using your arms (e.g., wheelchair or bedside chair)?: A Little ?Help needed to walk in hospital room?: A Little ?Help needed climbing 3-5 steps with a railing? : A Lot ?6 Click Score: 17 ? ?  ?End of Session Equipment Utilized During Treatment: Gait belt ?Activity Tolerance: Patient tolerated treatment well ?Patient left: with chair alarm set;with call bell/phone within reach;in chair ?Nurse Communication: Mobility status ?PT Visit Diagnosis: Unsteadiness on feet (R26.81);Muscle weakness (generalized)  (M62.81);Difficulty in walking, not elsewhere classified (R26.2);History of falling (Z91.81) ?  ? ? ?Time: 8144-8185 ?PT Time Calculation (min) (ACUTE ONLY): 23 min ? ?Charges:  $Gait Training: 8-22 mins ?$Therapeutic Activity: 8-22 mins          ?          ?Rica Koyanagi  PTA ?Acute  Rehabilitation Services ?Pager      [phone] ?Office      [phone] ? ?

## 2021-07-11 NOTE — Care Management Important Message (Signed)
Important Message ? ?Patient Details IM Letter placed in Patients room. ?Name: Benjamin Willis. ?MRN: [id number] ?Date of Birth: [birth year] ? ? ?Medicare Important Message Given:  Yes ? ? ? ? ?Kerin Salen ?07/11/2021, 2:42 PM ?

## 2021-07-11 NOTE — Progress Notes (Signed)
?Progress Note ? ? ?Patient: Benjamin Willis. ZOX:[id number] DOB: 1945/08/27 DOA: 07/02/2021     9 ?DOS: the patient was seen and examined on 07/11/2021 ?  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 Lorenso Courier presenting with generalized weakness, BLE weakness, tremors and fall when he tried to get out of the bed, ? ?CT head and MRI brain with for metastatic deposits in brain (progressed since his MRI on 05/13/2021) increased surrounding edema in the right parietal lobe with evidence of prior hemorrhage with progression of hemorrhage into right cerebellar lesion, left occipital lesion and right parietal lesion.  Started on steroid.   ?Oncology, neurooncology and radiation oncology involved.  Patient was discussed on tumor board meeting.  Given improvement with steroid, the recommendation is to continue Decadron 4 mg daily and arrange outpatient repeat MRI.  ? ?Therapy recommended SNF.  However, patient developed shingles across right lower chest and abdomen.  Started on Valtrex and airborne precaution on 3/5.  Medically stable for discharge if SNF can take him with airborne precaution or the lesions crust and he comes off airborne precaution. ? ? ? ?Assessment and Plan: ?* Non-small cell lung cancer metastatic to brain [facility]) ?Patient presents with fall, bilateral leg weakness, left> right.  MRI brain with increased brain mets, edema, and evidence of prior hemorrhage with progression in the right cerebellar lesion, left occipital lesion or right parietal lesions.  Started on Decadron.  Motor 4/5 in BLE on my exam. ?-Oncology, neurooncology and radiation oncology involved.   ?-Per neurooncology-Decadron 4 mg every 12 hours and outpatient follow-up for repeat MRI ?-Continue home Keppra ?-Appreciate input by palliative care ? ? ?Left leg weakness ?Likely from brain mets.  4/5 motor strength in BLE on my exam. ?-Decadron as  above. ?-Continue PT/OT ? ?Shingles rash ?Started Valtrex 1 g 3 times daily for 7 days on 3/5 ?Airborne and droplet precaution in a negative pressure room until lesions crust ?Complaints of pain, start low dose gabapentin.  ? ?Fall at home, initial encounter ?Likely due to brain mets.  No significant injury ?-Fall precaution ?-PT/OT ? ?Cognitive impairment ?Seems to have underlying cognitive impairment.  He is only oriented times 3.  ?-Reorientation and delirium precaution ?-B 12.  1971 ? ?Anemia associated with chemotherapy ?Recent Labs  ?  05/16/21 ?1213 06/07/21 ?1106 06/28/21 ?1338 07/02/21 ?1142 07/03/21 ?8119 07/04/21 ?1412 07/05/21 ?1478 07/06/21 ?2956 07/07/21 ?0745 07/09/21 ?0331  ?HGB 11.5* 11.5* 11.2* 12.6* 12.5* 11.3* 11.2* 11.4* 11.8* 11.4*  ?Hemoglobin at baseline. Last check 3/07. ? ? ?Leukopenia with mild neutropenia ?Resolved. ? ?Glaucoma ?Continue home eyedrops ? ?Diabetes mellitus type 2 in nonobese [facility]) ?Controlled with hyperglycemia.  A1c 6.7% on 05/01/2021. ?-Continue SSI-moderate ?-Continue with  Semglee  22 units twice daily ?-Continue NovoLog 5 units 3 times daily ?-Continue holding home glipizide, metformin and Januvia ? ? ?Hypertension ?BP stable off home antihypertensive meds. ?-Continue holding home antihypertensive meds ? ?Dyslipidemia associated with type 2 diabetes mellitus ([place]) ?- Continue home statin. ? ?Goals of care, counseling/discussion ?Patient with metastatic brain cancer as above.  Poor long-term prognosis.  ?-Palliative care signed off.  Recommended palliative follow-up at SNF ? ? ? ? ?  ? ?Subjective:  ?He is complaining of pain at site of single. Denies headaches or dizziness ? ? ?Physical Exam: ?Vitals:  ? 07/10/21 0623 07/10/21 1425 07/10/21 2113 07/11/21 0618  ?BP: 138/86 132/81  129/75 125/83  ?Pulse: 66 69 63 64  ?Resp: 16 13 16 16   ?Temp: 97.7 ?F (36.5 ?C) 98.2 ?F (36.8 ?C) 98.1 ?F (36.7 ?C) 97.9 ?F (36.6 ?C)  ?TempSrc: Oral Oral Oral Oral  ?SpO2: 99% 98% 98% 100%   ?Weight:      ?Height:      ? ?General; NAD ?Lung; CTA ?Abdomen; soft, nt, nd  ? ?Data Reviewed: ? ?No data available.  ? ?Family Communication: No family at bedside.  ? ?Disposition: ?Status is: Inpatient ?Remains inpatient appropriate because: awaiting SNF ? Planned Discharge Destination: Skilled nursing facility ? ? ? ?Time spent: 45 minutes ? ?Author: ?Elmarie Shiley, MD ?07/11/2021 3:06 PM ? ?For on call review [email or web address].  ?

## 2021-07-12 ENCOUNTER — Inpatient Hospital Stay
Admission: RE | Admit: 2021-07-12 | Discharge: 2021-07-12 | Disposition: A | Discharge status: Home / Self Care | Payer: Medicare HMO | Source: Ambulatory Visit | Attending: Internal Medicine | Admitting: Internal Medicine

## 2021-07-12 LAB — RESP PANEL BY RT-PCR (FLU A&B, COVID) ARPGX2
Influenza A by PCR: NEGATIVE
Influenza B by PCR: NEGATIVE
SARS Coronavirus 2 by RT PCR: NEGATIVE

## 2021-07-12 LAB — GLUCOSE, CAPILLARY
Glucose-Capillary: 78 mg/dL (ref 70–99)
Glucose-Capillary: 153 mg/dL — ABNORMAL HIGH (ref 70–99)
Glucose-Capillary: 147 mg/dL — ABNORMAL HIGH (ref 70–99)
Glucose-Capillary: 243 mg/dL — ABNORMAL HIGH (ref 70–99)

## 2021-07-12 MED ORDER — AMLODIPINE BESYLATE 5 MG PO TABS
5.0000 mg | ORAL_TABLET | Freq: Every day | ORAL | Status: DC
Start: 2021-07-12 — End: 2021-07-15
  Administered 2021-07-12 – 2021-07-15 (×4): 5 mg via ORAL
  Filled 2021-07-12 (×4): qty 1

## 2021-07-12 MED ORDER — DIPHENHYDRAMINE-ZINC ACETATE 2-0.1 % EX CREA
TOPICAL_CREAM | Freq: Two times a day (BID) | CUTANEOUS | Status: DC | PRN
  Filled 2021-07-12: qty 28

## 2021-07-12 NOTE — Plan of Care (Signed)
?  Problem: Education: ?Goal: Knowledge of General Education information will improve ?Description: Including pain rating scale, medication(s)/side effects and non-pharmacologic comfort measures ?07/12/2021 0745 by Mayme Genta, RN ?Outcome: Progressing ?07/12/2021 0745 by Mayme Genta, RN ?Outcome: Progressing ?  ?

## 2021-07-12 NOTE — TOC Progression Note (Addendum)
Transition of Care (TOC) - Progression Note  ? ?Patient Details  ?Name: Benjamin Willis. ?MRN: [id number] ?Date of Birth: [birth year] ? ?Transition of Care (TOC) CM/SW Contact  ?Sherie Don, LCSW ?Phone Number: ?07/12/2021, 1:02 PM ? ?Clinical Narrative: Juliann Pulse with [facility] can accept the patient, but will have to start insurance authorization. TOC awaiting authorization. ? ?Addendum ?3:23pm: CSW updated by Juliann Pulse with [facility] that [facility] called her to let her know the patient's insurance authorization is still pending, but if the patient is denied SNF a peer-to-peer can be done before 07/15/21 at 12:15pm. The number for the appeal is [phone] ext. [phone] for [facility]. ? ?Expected Discharge Plan: [place] ?Barriers to Discharge: Continued Medical Work up, Ship broker, SNF Pending bed offer ? ?Expected Discharge Plan and Services ?Expected Discharge Plan: [place] ?In-house Referral: Clinical Social Work ?Post Acute Care Choice: [place] ?Living arrangements for the past 2 months: [place]           ?DME Arranged: N/A ?DME Agency: NA ? ?Readmission Risk Interventions ?Readmission Risk Prevention Plan 07/05/2021 01/10/2021  ?Transportation Screening Complete Complete  ?PCP or Specialist Appt within 3-5 Days Complete Complete  ?[place] or Home Care Consult Complete Complete  ?Social Work Consult for [place] Planning/Counseling Complete Complete  ?Palliative Care Screening Complete Not Applicable  ?Medication Review Press photographer) Complete Complete  ?Some recent data might be hidden  ? ?

## 2021-07-12 NOTE — Progress Notes (Signed)
?Progress Note ? ? ?Patient: Benjamin Willis. IRS:[id number] DOB: [birth year] DOA: 07/02/2021     10 ?DOS: the patient was seen and examined on 07/12/2021 ?  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 Lorenso Courier presenting with generalized weakness, BLE weakness, tremors and fall when he tried to get out of the bed, ? ?CT head and MRI brain with for metastatic deposits in brain (progressed since his MRI on 05/13/2021) increased surrounding edema in the right parietal lobe with evidence of prior hemorrhage with progression of hemorrhage into right cerebellar lesion, left occipital lesion and right parietal lesion.  Started on steroid.   ?Oncology, neurooncology and radiation oncology involved.  Patient was discussed on tumor board meeting.  Given improvement with steroid, the recommendation is to continue Decadron 4 mg daily and arrange outpatient repeat MRI.  ? ?Therapy recommended SNF.  However, patient developed shingles across right lower chest and abdomen.  Started on Valtrex and airborne precaution on 3/5.  Medically stable for discharge if SNF can take him with airborne precaution or the lesions crust and he comes off airborne precaution. ? ? ? ?Assessment and Plan: ?* Non-small cell lung cancer metastatic to brain [facility]) ?Patient presents with fall, bilateral leg weakness, left> right.  MRI brain with increased brain mets, edema, and evidence of prior hemorrhage with progression in the right cerebellar lesion, left occipital lesion or right parietal lesions.  Started on Decadron.  Motor 4/5 in BLE on my exam. ?-Oncology, neurooncology and radiation oncology involved.   ?-Per neurooncology-Decadron 4 mg every 12 hours and outpatient follow-up for repeat MRI ?-Continue home Keppra ?-Appreciate input by palliative care ? ? ?Left leg weakness ?Likely from brain mets.  4/5 motor strength in BLE on my exam. ?-Decadron as  above. ?-Continue PT/OT ? ?Shingles rash ?Started Valtrex 1 g 3 times daily for 7 days on 3/5 ?Airborne and droplet precaution in a negative pressure room until lesions crust ?Started low dose gabapentin. And benadryl cream.  ? ?Fall at home, initial encounter ?Likely due to brain mets.  No significant injury ?-Fall precaution ?-PT/OT ? ?Cognitive impairment ?Seems to have underlying cognitive impairment.  He is only oriented times 3.  ?-Reorientation and delirium precaution ?-B 12.  1971 ? ?Anemia associated with chemotherapy ?Hb stable.  ? ? ?Leukopenia with mild neutropenia ?Resolved. ? ?Glaucoma ?Continue home eyedrops ? ?Diabetes mellitus type 2 in nonobese [facility]) ?Controlled with hyperglycemia.  A1c 6.7% on 05/01/2021. ?-Continue SSI-moderate ?-Continue with  Semglee  22 units twice daily ?-Continue NovoLog 5 units 3 times daily ?-Continue holding home glipizide, metformin and Januvia ? ? ?Hypertension ?BP increasing, resume norvasc, low dose.  ? ?Dyslipidemia associated with type 2 diabetes mellitus ([place]) ?- Continue home statin. ? ?Goals of care, counseling/discussion ?Patient with metastatic brain cancer as above.  Poor long-term prognosis.  ?-Palliative care signed off.  Recommended palliative follow-up at SNF ? ? ? ? ?  ? ?Subjective:  ?He complaints of itching rash.  ?He fell last night, didn't hit head.  ? ?Physical Exam: ?Vitals:  ? 07/11/21 2221 07/12/21 0214 07/12/21 0641 07/12/21 1348  ?BP: 123/86 131/79 137/81 (!) 142/101  ?Pulse: 70 64 67 78  ?Resp: 16 16 18 18   ?Temp: 98.3 ?F (36.8 ?C) 98.5 ?F (36.9 ?C) 98.2 ?F (36.8 ?C) 98 ?F (36.7 ?C)  ?TempSrc: Oral Oral Oral Oral  ?SpO2: 100% 99% 98% 100%  ?  Weight:      ?Height:      ? ?General; NAD ?Lung; CTA ?Abdomen; soft, nt.  ? ?Data Reviewed: ? ?No data available.  ? ?Family Communication: care discussed with patient.  ? ?Disposition: ?Status is: Inpatient ?Remains inpatient appropriate because: awaiting placement. Admitted with cerebral edema form brain  mets.  ? Planned Discharge Destination: Skilled nursing facility ? ? ? ?Time spent: 45 minutes ? ?Author: ?Elmarie Shiley, MD ?07/12/2021 2:56 PM ? ?For on call review [email or web address]  ?

## 2021-07-12 NOTE — Progress Notes (Signed)
Physical Therapy Treatment ?Patient Details ?Name: Benjamin Willis. ?MRN: [id number] ?DOB: Aug 12, 1945 ?Today's Date: 07/12/2021 ? ? 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 ? ?  ?PT Comments  ? ? Patient is eager to work with therapy and motivated to improve mobility. He continues to be a high fall risk with poor posture, balance, and weakness. Pt continues to have slow shuffled steps during gait; cues to march during gait improved pt's step length and foot clearance but as he fatigues step quality declines. He continues to extend walker too far ahead and has narrow steps/BOS further reducing stability in static and dynamic standing. Pt will benefit from follow up therapy at SNF setting to progress safety with mobility, will progress in acute setting as able. ? ?  ?Recommendations for follow up therapy are one component of a multi-disciplinary discharge planning process, led by the attending physician.  Recommendations may be updated based on patient status, additional functional criteria and insurance authorization. ? ?Follow Up Recommendations ? Skilled nursing-short term rehab (<3 hours/day) ?  ?  ?Assistance Recommended at Discharge Frequent or constant Supervision/Assistance  ?Patient can return home with the following Assistance with cooking/housework;Direct supervision/assist for medications management;Direct supervision/assist for financial management;Help with stairs or ramp for entrance;Assist for  transportation;A lot of help with bathing/dressing/bathroom;A lot of help with walking and/or transfers ?  ?Equipment Recommendations ? None recommended by PT  ?  ?Recommendations for Other Services   ? ? ?  ?Precautions / Restrictions Precautions ?Precautions: Fall ?Precaution Comments: fell prior to admission and during hospitalization found on floor on his buttock 3/10 ?Restrictions ?Weight Bearing Restrictions: No  ?  ? ?Mobility ? Bed Mobility ?  ?  ?  ?  ?  ?  ?  ?General bed mobility comments: pt OOB in recliner ?  ? ?Transfers ?Overall transfer level: Needs assistance ?  ?Transfers: Sit to/from Stand ?Sit to Stand: Min assist ?  ?  ?  ?  ?  ?General transfer comment: Min assist for power up with moderate cues for technique. pt requires bil UE to initiate power up and to cnotrol lowering to chair. completed 3x from recliner with RW. ?  ? ?Ambulation/Gait ?Ambulation/Gait assistance: Min assist, Mod assist ?Gait Distance (Feet): 180 Feet (100, 80) ?Assistive device: Rolling walker (2 wheels) ?Gait Pattern/deviations: Step-to pattern, Decreased step length - right, Decreased step length - left, Trunk flexed, Decreased stride length, Narrow base of support ?Gait velocity: decr ?  ?  ?General Gait Details: pt with short shuffled steps, narrow BOS, and flexed posture. repeated verbal cues and assist required to maintain safe position to walker as pt has tendency to push walker too far forward. VC's to march when stepping improved step length and foto clearance during swing phase. Pt remains HIGH Fall Risk. ? ? ?Stairs ?  ?  ?  ?  ?  ? ? ?Wheelchair Mobility ?  ? ?Modified Rankin (Stroke Patients Only) ?  ? ? ?  ?Balance Overall balance assessment: History of Falls, Needs assistance ?Sitting-balance support:  Feet supported ?Sitting balance-Leahy Scale: Fair ?  ?  ?Standing balance support: Bilateral upper extremity supported, Single extremity supported, Reliant on assistive device for balance ?Standing  balance-Leahy Scale: Poor ?Standing balance comment: relies on BUE support, posterior lean initially, LT knee buckle ?  ?  ?  ?  ?  ?  ?  ?  ?  ?  ?  ?  ? ?  ?Cognition Arousal/Alertness: Awake/alert ?Behavior During Therapy: [facility] for tasks assessed/performed ?Overall Cognitive Status: History of cognitive impairments - at baseline ?  ?  ?  ?  ?  ?  ?  ?  ?  ?  ?  ?  ?  ?  ?  ?  ?  ?  ?  ? ?  ?Exercises   ? ?  ?General Comments   ?  ?  ? ?Pertinent Vitals/Pain Pain Assessment ?Pain Assessment: No/denies pain  ? ? ?Home Living   ?  ?  ?  ?  ?  ?  ?  ?  ?  ?   ?  ?Prior Function    ?  ?  ?   ? ?PT Goals (current goals can now be found in the care plan section) Acute Rehab PT Goals ?Patient Stated Goal: short term rehab per sister ?PT Goal Formulation: With patient ?Time For Goal Achievement: 07/17/21 ?Potential to Achieve Goals: Fair ?Progress towards PT goals: Progressing toward goals ? ?  ?Frequency ? ? ? Min 2X/week ? ? ? ?  ?PT Plan Current plan remains appropriate  ? ? ?Co-evaluation   ?  ?  ?  ?  ? ?  ?AM-PAC PT "6 Clicks" Mobility   ?Outcome Measure ? Help needed turning from your back to your side while in a flat bed without using bedrails?: A Little ?Help needed moving from lying on your back to sitting on the side of a flat bed without using bedrails?: A Little ?Help needed moving to and from a bed to a chair (including a wheelchair)?: A Little ?Help needed standing up from a chair using your arms (e.g., wheelchair or bedside chair)?: A Little ?Help needed to walk in hospital room?: A Lot ?Help needed climbing 3-5 steps with a railing? : A Lot ?6 Click Score: 16 ? ?  ?End of Session Equipment Utilized During Treatment: Gait belt ?Activity Tolerance: Patient tolerated treatment well ?Patient left: with chair alarm set;with call bell/phone within reach;in chair ?Nurse Communication: Mobility status ?PT Visit Diagnosis: Unsteadiness on feet (R26.81);Muscle weakness (generalized) (M62.81);Difficulty in walking,  not elsewhere classified (R26.2);History of falling (Z91.81) ?  ? ? ?Time: 3419-6222 ?PT Time Calculation (min) (ACUTE ONLY): 30 min ? ?Charges:  $Gait Training: 23-37 mins          ?          ? ?Gwynneth Albright PT, DPT ?Acute Rehabilitation Services ?Office [phone] ?Pager [phone]  ? ? ?Jacques Navy ?07/12/2021, 2:07 PM ? ?

## 2021-07-12 NOTE — Progress Notes (Signed)
Occupational Therapy Progress Note ? ?Patient min A with ambulation to/from bathroom needing verbal cues to take larger steps into walker vs shuffled steps and pushing walker too far forward. Patient tolerate standing at sink for ~5 minutes to brush dentures at min G level. Cues for safety to utilize cup to rinse his mouth vs trying to bend down to reach mouth to faucet. Able to maintain fair standing balance for brief periods during oral care at min G level. Reiterated to patient to utilize call light if needing to get out of the chair, chair alarm on at end of session. ? ? ? 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  ?Precautions  ?Precautions Fall  ?Precaution Comments fell prior to admission and during hospitalization found on floor on his buttock 3/10  ?Restrictions  ?Weight Bearing Restrictions No  ?Pain Assessment  ?Pain Assessment No/denies pain  ?Cognition  ?Arousal/Alertness Awake/alert  ?Behavior During Therapy [facility] for tasks assessed/performed  ?Overall Cognitive Status History of cognitive impairments - at baseline  ?ADL  ?Overall ADL's  Needs assistance/impaired  ?Grooming Oral care;Min guard;Standing  ?Grooming Details (indicate cue type and reason) Patient needing verbal cues for safety to use cup vs trying to lean head under sink to rinse mouth. Min G in standing for safety  ?Toilet Transfer Minimal  assistance;Ambulation;Rolling walker (2 wheels)  ?Toilet Transfer Details (indicate cue type and reason) Patient taking small shuffled steps with rolling walker, cues to take larger steps into the walker vs pushing too far forward. Min A safety  ?Bed Mobility  ?General bed mobility comments pt OOB in recliner  ?Balance  ?Overall balance assessment History of Falls;Needs assistance  ?Sitting-balance support Feet supported  ?Sitting balance-Leahy Scale Fair  ?Standing balance support No upper extremity supported;During functional activity  ?Standing balance-Leahy Scale Fair  ?Standing balance comment While brushing dentures patient is able to stand without UE support for short periods  ?OT - End of Session  ?Equipment Utilized During [employer] walker (2 wheels)  ?Activity Tolerance Patient tolerated treatment well  ?Patient left in chair;with call bell/phone within reach;with chair alarm set  ?Nurse Communication Mobility status  ?OT Assessment/Plan  ?OT Plan Discharge plan remains appropriate  ?OT Visit Diagnosis Unsteadiness on feet (R26.81);History of falling (Z91.81);Muscle weakness (generalized) (M62.81);Other symptoms and signs involving cognitive function;Cognitive communication deficit (R41.841)  ?Symptoms and signs involving cognitive functions Other cerebrovascular disease  ?OT Frequency (ACUTE ONLY) Min 2X/week  ?Follow Up Recommendations Skilled nursing-short term rehab (<3 hours/day)  ?Assistance recommended at discharge Frequent or constant Supervision/Assistance  ?Patient can return home with the following A little help with bathing/dressing/bathroom;A little help with walking and/or transfers;Direct supervision/assist for medications management;Direct supervision/assist for financial management;Assist for transportation;Assistance with cooking/housework;Help with stairs or ramp for entrance  ?OT Equipment Other (comment) ?(TBD)  ?AM-PAC OT "6 Clicks" Daily Activity Outcome Measure (Version 2)   ?Help from another person eating meals? 3  ?Help from another person  taking care of personal grooming? 3  ?Help from another person toileting, which includes using toliet, bedpan, or urinal? 2  ?Help from another person bathing (including washing, rinsing, drying)? 3  ?Help from another person to put on and taking off regular upper body clothing? 3  ?Help from another person to put on and taking off regular lower body clothing? 2  ?6 Click Score 16  ?Progressive Mobility  ?What is the highest level of mobility based on the progressive mobility assessment? Level 5 (Walks with assist in room/hall) - Balance while stepping forward/back and can walk in room with assist - Complete  ?Activity Ambulated with assistance to bathroom  ?OT Goal Progression  ?Progress towards OT goals Progressing toward goals  ?Acute Rehab OT Goals  ?Patient Stated Goal Brush his dentures  ?OT Goal Formulation With patient  ?Time For Goal Achievement 07/18/21  ?Potential to Achieve Goals Good  ?ADL Goals  ?Pt Will Perform Grooming standing;with supervision;with set-up  ?Pt Will Perform Lower Body Dressing with set-up;with min guard assist;sitting/lateral leans;sit to/from stand  ?Pt Will Transfer to Toilet with supervision;ambulating  ?Pt Will Perform Toileting - Clothing Manipulation and hygiene with min guard assist;sitting/lateral leans;sit to/from stand;with caregiver independent in assisting  ?Pt/caregiver will Perform Home Exercise Program Left upper extremity;Both right and left upper extremity;With theraband;With Supervision;With minimal assist;Increased strength;Increased ROM  ?Additional ADL Goal #1 Pt will demonstrate improved mentation by scoring <5/10 on short blessed test and answering 4/4 safety questions from the [facility] correctly:   1. What do you do for yourself if you are sick with a cold.    2. What do you do if you burn yourself and the wound becomes infected.    3. What do you do if you experience severe chest pain and  shortness of breath?   4. What number do you call in an emergency?  ?OT Time Calculation  ?OT Start Time (ACUTE ONLY) 1128  ?OT Stop Time (ACUTE ONLY) 1152  ?OT Time Calculation (min) 24 min  ?OT General Charges  ?$OT Visit 1 Visit  ?OT Treatments  ?$Self Care/Home Management  23-37 mins  ? ? ?Benjamin Willis OT ?OT pager: [phone] ? ?

## 2021-07-12 NOTE — Progress Notes (Signed)
Pt was found on the floor on his buttock in front of the recliner. Pt stated that he was trying to get to the bathroom. Pt denies hitting his head or injuring himself anywhere. Denies any pain except where his shingles blisters are located. Assisted pt back into the bed. VSS. Bed alarm on, fall mats in place and belongings within reach. Will continue to monitor.  ?

## 2021-07-13 LAB — GLUCOSE, CAPILLARY
Glucose-Capillary: 90 mg/dL (ref 70–99)
Glucose-Capillary: 156 mg/dL — ABNORMAL HIGH (ref 70–99)
Glucose-Capillary: 278 mg/dL — ABNORMAL HIGH (ref 70–99)
Glucose-Capillary: 217 mg/dL — ABNORMAL HIGH (ref 70–99)

## 2021-07-13 NOTE — Progress Notes (Signed)
?Progress Note ? ? ?Patient: Benjamin Willis. IRW:[id number] DOB: 03/11/1946 DOA: 07/02/2021     11 ?DOS: the patient was seen and examined on 07/13/2021 ?  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 Lorenso Courier presenting with generalized weakness, BLE weakness, tremors and fall when he tried to get out of the bed, ? ?CT head and MRI brain with for metastatic deposits in brain (progressed since his MRI on 05/13/2021) increased surrounding edema in the right parietal lobe with evidence of prior hemorrhage with progression of hemorrhage into right cerebellar lesion, left occipital lesion and right parietal lesion.  Started on steroid.   ?Oncology, neurooncology and radiation oncology involved.  Patient was discussed on tumor board meeting.  Given improvement with steroid, the recommendation is to continue Decadron 4 mg daily and arrange outpatient repeat MRI.  ? ?Therapy recommended SNF.  However, patient developed shingles across right lower chest and abdomen.  Started on Valtrex and airborne precaution on 3/5.  Medically stable for discharge if SNF can take him with airborne precaution or the lesions crust and he comes off airborne precaution. ? ? ? ?Assessment and Plan: ?* Non-small cell lung cancer metastatic to brain [facility]) ?Patient presents with fall, bilateral leg weakness, left> right.  MRI brain with increased brain mets, edema, and evidence of prior hemorrhage with progression in the right cerebellar lesion, left occipital lesion or right parietal lesions.  Started on Decadron.  Motor 4/5 in BLE on my exam. ?-Oncology, neurooncology and radiation oncology involved.   ?-Per neurooncology-Decadron 4 mg every 12 hours and outpatient follow-up for repeat MRI ?-Continue home Keppra ?-Appreciate input by palliative care ? ? ?Left leg weakness ?Likely from brain mets.  4/5 motor strength in BLE on my exam. ?-Decadron as  above. ?-Continue PT/OT ? ?Shingles rash ?Started Valtrex 1 g 3 times daily for 7 days on 3/5 ?Airborne and droplet precaution in a negative pressure room until lesions crust ?Started low dose gabapentin And benadryl cream.  ? ?Fall at home, initial encounter ?Likely due to brain mets.  No significant injury ?-Fall precaution ?-PT/OT ? ?Cognitive impairment ?Seems to have underlying cognitive impairment.  He is only oriented times 3.  ?-Reorientation and delirium precaution ?-B 12.  1971 ? ?Anemia associated with chemotherapy ?Hb stable.  ?Will check labs  ? ? ?Leukopenia with mild neutropenia ?Resolved. ? ?Glaucoma ?Continue home eyedrops ? ?Diabetes mellitus type 2 in nonobese [facility]) ?Controlled with hyperglycemia.  A1c 6.7% on 05/01/2021. ?-Continue SSI-moderate ?-Continue with  Semglee  22 units twice daily ?-Continue NovoLog 5 units 3 times daily ?-Continue holding home glipizide, metformin and Januvia ? ? ?Hypertension ?BP controlled. Tolerating norvasc.  ? ?Dyslipidemia associated with type 2 diabetes mellitus ([place]) ?- Continue home statin. ? ?Goals of care, counseling/discussion ?Patient with metastatic brain cancer as above.  Poor long-term prognosis.  ?-Palliative care signed off.  Recommended palliative follow-up at SNF ? ? ? ? ?  ? ?Subjective: ?He report itching, didn't get benadryl cream yesterday.  ? ? ?Physical Exam: ?Vitals:  ? 07/12/21 1348 07/12/21 1955 07/12/21 1956 07/13/21 7619  ?BP: (!) 142/101 126/79  123/72  ?Pulse: 78 64  60  ?Resp: 18 20  20   ?Temp: 98 ?F (36.7 ?C)  97.8 ?F (36.6 ?C) 97.8 ?F (36.6 ?C)  ?TempSrc: Oral  Oral Oral  ?SpO2: 100% 100%  100%  ?Weight:      ?  Height:      ? ?General; NAD ?Lung; CTA ?Abdomen; soft, nt,  ?Skin; blister present ?Data Reviewed: ? ?  ? ?Family Communication: care discussed with patient.  ? ?Disposition: ?Status is: Inpatient ?Remains inpatient appropriate because: awaiting SNF  ? Planned Discharge Destination: Skilled nursing facility ? ? ? ?Time spent:  45 minutes ? ?Author: ?Elmarie Shiley, MD ?07/13/2021 1:33 PM ? ?For on call review [email or web address].  ?

## 2021-07-13 NOTE — Plan of Care (Signed)
?  Problem: Education: ?Goal: Knowledge of General Education information will improve ?Description: Including pain rating scale, medication(s)/side effects and non-pharmacologic comfort measures ?07/13/2021 0132 by Abagail Kitchens, RN ?Outcome: Progressing ?07/13/2021 0131 by Abagail Kitchens, RN ?Outcome: Progressing ?  ?Problem: Safety: ?Goal: Ability to remain free from injury will improve ?Outcome: Progressing ?  ?Problem: Skin Integrity: ?Goal: Risk for impaired skin integrity will decrease ?Outcome: Progressing ?  ?

## 2021-07-13 NOTE — TOC Progression Note (Signed)
Transition of Care (TOC) - Progression Note  ? ? ?Patient Details  ?Name: Benjamin Willis. ?MRN: [id number] ?Date of Birth: April 26, 1946 ? ?Transition of Care (TOC) CM/SW Contact  ?Ross Ludwig, LCSW ?Phone Number: ?07/13/2021, 3:25 PM ? ?Clinical Narrative:    ? ?Per [facility] is still pending.  CSW to continue to follow patient's progress throughout discharge planning. ? ? ?Expected Discharge Plan: [place] ?Barriers to Discharge: Continued Medical Work up, Ship broker, SNF Pending bed offer ? ?Expected Discharge Plan and Services ?Expected Discharge Plan: [place] ?In-house Referral: Clinical Social Work ?  ?Post Acute Care Choice: [place] ?Living arrangements for the past 2 months: [place] ?                ?DME Arranged: N/A ?DME Agency: NA ?  ?  ?  ?  ?  ?  ?  ?  ? ? ?Social Determinants of Health (SDOH) Interventions ?  ? ?Readmission Risk Interventions ?Readmission Risk Prevention Plan 07/05/2021 01/10/2021  ?Transportation Screening Complete Complete  ?PCP or Specialist Appt within 3-5 Days Complete Complete  ?[place] or Home Care Consult Complete Complete  ?Social Work Consult for [place] Planning/Counseling Complete Complete  ?Palliative Care Screening Complete Not Applicable  ?Medication Review [employer]) Complete Complete  ?Some recent data might be hidden  ? ? ?

## 2021-07-14 LAB — CBC
HCT: 34.5 % — ABNORMAL LOW (ref 39.0–52.0)
Hemoglobin: 11.2 g/dL — ABNORMAL LOW (ref 13.0–17.0)
MCH: 32.5 pg (ref 26.0–34.0)
MCHC: 32.5 g/dL (ref 30.0–36.0)
MCV: 100 fL (ref 80.0–100.0)
Platelets: 195 10*3/uL (ref 150–400)
RBC: 3.45 MIL/uL — ABNORMAL LOW (ref 4.22–5.81)
RDW: 13.3 % (ref 11.5–15.5)
WBC: 9.1 10*3/uL (ref 4.0–10.5)
nRBC: 0.7 % — ABNORMAL HIGH (ref 0.0–0.2)

## 2021-07-14 LAB — GLUCOSE, CAPILLARY
Glucose-Capillary: 212 mg/dL — ABNORMAL HIGH (ref 70–99)
Glucose-Capillary: 260 mg/dL — ABNORMAL HIGH (ref 70–99)
Glucose-Capillary: 137 mg/dL — ABNORMAL HIGH (ref 70–99)
Glucose-Capillary: 206 mg/dL — ABNORMAL HIGH (ref 70–99)
Glucose-Capillary: 184 mg/dL — ABNORMAL HIGH (ref 70–99)

## 2021-07-14 LAB — BASIC METABOLIC PANEL
Anion gap: 5 (ref 5–15)
BUN: 20 mg/dL (ref 8–23)
CO2: 27 mmol/L (ref 22–32)
Calcium: 8.9 mg/dL (ref 8.9–10.3)
Chloride: 105 mmol/L (ref 98–111)
Creatinine, Ser: 0.8 mg/dL (ref 0.61–1.24)
GFR, Estimated: >60 mL/min (ref >60)
Glucose, Bld: 24 mg/dL — CL (ref 70–99)
Potassium: 4.3 mmol/L (ref 3.5–5.1)
Sodium: 137 mmol/L (ref 135–145)

## 2021-07-14 MED ORDER — INSULIN GLARGINE-YFGN 100 UNIT/ML ~~LOC~~ SOLN
16.0000 [IU] | Freq: Two times a day (BID) | SUBCUTANEOUS | Status: DC
  Administered 2021-07-14 (×2): 16 [IU] via SUBCUTANEOUS
  Filled 2021-07-14 (×3): qty 0.16

## 2021-07-14 NOTE — Progress Notes (Signed)
Date and time results received: 07/14/21 at 0445 ? ?Test: Venous Blood Glucose ?Critical Value: 24 ? ?Name of Provider Notified: Notified primary RN, Verdene Lennert, who will notify provider following hypoglycemia protocol. ? ?Orders Received? Or Actions Taken?:  hypoglycemic protocol ordered and initiated ?

## 2021-07-14 NOTE — Progress Notes (Signed)
Pt given 8 oz of apple juice after implementation of hypoglycemic protocol. Pt CBG went back up to 212. Pt resting in bed, no distress noted. Will continue to monitor.  Notified NP [place]. ?

## 2021-07-14 NOTE — Progress Notes (Signed)
?Progress Note ? ? ?Patient: Benjamin Willis. KKX:[id number] DOB: Nov 02, 1945 DOA: 07/02/2021     12 ?DOS: the patient was seen and examined on 07/14/2021 ?  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 Lorenso Courier presenting with generalized weakness, BLE weakness, tremors and fall when he tried to get out of the bed, ? ?CT head and MRI brain with for metastatic deposits in brain (progressed since his MRI on 05/13/2021) increased surrounding edema in the right parietal lobe with evidence of prior hemorrhage with progression of hemorrhage into right cerebellar lesion, left occipital lesion and right parietal lesion.  Started on steroid.   ?Oncology, neurooncology and radiation oncology involved.  Patient was discussed on tumor board meeting.  Given improvement with steroid, the recommendation is to continue Decadron 4 mg daily and arrange outpatient repeat MRI.  ? ?Therapy recommended SNF.  However, patient developed shingles across right lower chest and abdomen.  Started on Valtrex and airborne precaution on 3/5.  Medically stable for discharge if SNF can take him with airborne precaution or the lesions crust and he comes off airborne precaution. ? ? ? ?Assessment and Plan: ?* Non-small cell lung cancer metastatic to brain [facility]) ?Patient presents with fall, bilateral leg weakness, left> right.  MRI brain with increased brain mets, edema, and evidence of prior hemorrhage with progression in the right cerebellar lesion, left occipital lesion or right parietal lesions.  Started on Decadron.  Motor 4/5 in BLE on my exam. ?-Oncology, neurooncology and radiation oncology involved.   ?-Per neurooncology-Decadron 4 mg every 12 hours and outpatient follow-up for repeat MRI ?-Continue home Keppra ?-Appreciate input by palliative care ? ? ?Left leg weakness ?Likely from brain mets.  4/5 motor strength in BLE on my exam. ?-Decadron as  above. ?-Continue PT/OT ? ?Shingles rash ?Started Valtrex 1 g 3 times daily for 7 days on 3/5 ?Airborne and droplet precaution in a negative pressure room until lesions crust ?Started low dose gabapentin and benadryl cream.  ? ?Fall at home, initial encounter ?Likely due to brain mets.  No significant injury ?-Fall precaution ?-PT/OT ? ?Cognitive impairment ?Seems to have underlying cognitive impairment.  He is only oriented times 3.  ?-Reorientation and delirium precaution ?-B 12.  1971 ? ?Anemia associated with chemotherapy ?Hb stable.  ?\ ? ? ?Leukopenia with mild neutropenia ?Resolved. ? ?Glaucoma ?Continue home eyedrops ? ?Diabetes mellitus type 2 in nonobese [facility]) ?Controlled with hyperglycemia.  A1c 6.7% on 05/01/2021. And Hypoglycemia.  ?-Continue SSI-moderate ?-Continue NovoLog 5 units 3 times daily ?-Continue holding home glipizide, metformin and Januvia ?-Plan to decreased Semglee to 16-- due to hypoglycemia. His Bmet glucose was 24. Will check CBG at 3 am tonight.  ? ?Hypertension ?BP controlled. Tolerating norvasc.  ? ?Dyslipidemia associated with type 2 diabetes mellitus ([place]) ?- Continue home statin. ? ?Goals of care, counseling/discussion ?Patient with metastatic brain cancer as above.  Poor long-term prognosis.  ?-Palliative care signed off.  Recommended palliative follow-up at SNF ? ? ? ? ?  ? ?Subjective: he report less itching.  ? ?Physical Exam: ?Vitals:  ? 07/13/21 1345 07/13/21 2116 07/14/21 0449 07/14/21 1411  ?BP: 139/71 (!) 111/57 128/74 127/63  ?Pulse: 63 60 62 72  ?Resp: 18 16 16 20   ?Temp: 98 ?F (36.7 ?C) 97.8 ?F (36.6 ?C) 97.9 ?F (36.6 ?C) 98 ?F (36.7 ?C)  ?TempSrc: Oral Oral Oral Oral  ?SpO2:  99% 100% 100% 100%  ?Weight:      ?Height:      ? ?General; NAD ?CVS; S 1, S 2 RRR ?Abdomen; soft nt, nd ? ?Data Reviewed: ? ?Bmet, CBC ? ?Family Communication: Care discussed with patient.  ? ?Disposition: ?Status is: Inpatient ?Remains inpatient appropriate because: awaiting SNF ? Planned  Discharge Destination: Skilled nursing facility ? ? ? ?Time spent: 45 minutes ? ?Author: ?Elmarie Shiley, MD ?07/14/2021 3:49 PM ? ?For on call review [email or web address]  ?

## 2021-07-14 NOTE — Plan of Care (Signed)
?  Problem: Clinical Measurements: ?Goal: Will remain free from infection ?Outcome: Progressing ?  ?Problem: Skin Integrity: ?Goal: Risk for impaired skin integrity will decrease ?Outcome: Progressing ?  ?Problem: Safety: ?Goal: Ability to remain free from injury will improve ?Outcome: Progressing ?  ?

## 2021-07-14 NOTE — Plan of Care (Signed)
  Problem: Safety: Goal: Ability to remain free from injury will improve Outcome: Progressing   Problem: Skin Integrity: Goal: Risk for impaired skin integrity will decrease Outcome: Progressing   

## 2021-07-14 NOTE — TOC Progression Note (Signed)
Transition of Care (TOC) - Progression Note  ? ? ?Patient Details  ?Name: Benjamin Willis. ?MRN: [id number] ?Date of Birth: 1945/09/12 ? ?Transition of Care (TOC) CM/SW Contact  ?Ross Ludwig, LCSW ?Phone Number: ?07/14/2021, 5:53 PM ? ?Clinical Narrative:    ? ?Insurance auth still pending for [facility]. ? ?Expected Discharge Plan: [place] ?Barriers to Discharge: Continued Medical Work up, Ship broker, SNF Pending bed offer ? ?Expected Discharge Plan and Services ?Expected Discharge Plan: [place] ?In-house Referral: Clinical Social Work ?  ?Post Acute Care Choice: [place] ?Living arrangements for the past 2 months: [place] ?                ?DME Arranged: N/A ?DME Agency: NA ?  ?  ?  ?  ?  ?  ?  ?  ? ? ?Social Determinants of Health (SDOH) Interventions ?  ? ?Readmission Risk Interventions ?Readmission Risk Prevention Plan 07/05/2021 01/10/2021  ?Transportation Screening Complete Complete  ?PCP or Specialist Appt within 3-5 Days Complete Complete  ?[place] or Home Care Consult Complete Complete  ?Social Work Consult for [place] Planning/Counseling Complete Complete  ?Palliative Care Screening Complete Not Applicable  ?Medication Review Press photographer) Complete Complete  ?Some recent data might be hidden  ? ? ?

## 2021-07-15 ENCOUNTER — Inpatient Hospital Stay: Payer: Medicare HMO | Admitting: Internal Medicine

## 2021-07-15 ENCOUNTER — Inpatient Hospital Stay: Payer: Medicare HMO

## 2021-07-15 DIAGNOSIS — D708 Other neutropenia: Secondary | ICD-10-CM | POA: Diagnosis not present

## 2021-07-15 DIAGNOSIS — C3411 Malignant neoplasm of upper lobe, right bronchus or lung: Secondary | ICD-10-CM | POA: Diagnosis not present

## 2021-07-15 DIAGNOSIS — R2689 Other abnormalities of gait and mobility: Secondary | ICD-10-CM | POA: Diagnosis not present

## 2021-07-15 DIAGNOSIS — Z7401 Bed confinement status: Secondary | ICD-10-CM | POA: Diagnosis not present

## 2021-07-15 DIAGNOSIS — B029 Zoster without complications: Secondary | ICD-10-CM | POA: Diagnosis not present

## 2021-07-15 DIAGNOSIS — E46 Unspecified protein-calorie malnutrition: Secondary | ICD-10-CM | POA: Diagnosis not present

## 2021-07-15 DIAGNOSIS — Z5111 Encounter for antineoplastic chemotherapy: Secondary | ICD-10-CM | POA: Diagnosis not present

## 2021-07-15 DIAGNOSIS — Z79899 Other long term (current) drug therapy: Secondary | ICD-10-CM | POA: Diagnosis not present

## 2021-07-15 DIAGNOSIS — M6281 Muscle weakness (generalized): Secondary | ICD-10-CM | POA: Diagnosis not present

## 2021-07-15 DIAGNOSIS — C3490 Malignant neoplasm of unspecified part of unspecified bronchus or lung: Secondary | ICD-10-CM | POA: Diagnosis not present

## 2021-07-15 DIAGNOSIS — G4089 Other seizures: Secondary | ICD-10-CM | POA: Diagnosis not present

## 2021-07-15 DIAGNOSIS — G936 Cerebral edema: Secondary | ICD-10-CM | POA: Diagnosis not present

## 2021-07-15 DIAGNOSIS — T451X5A Adverse effect of antineoplastic and immunosuppressive drugs, initial encounter: Secondary | ICD-10-CM | POA: Diagnosis not present

## 2021-07-15 DIAGNOSIS — W19XXXD Unspecified fall, subsequent encounter: Secondary | ICD-10-CM | POA: Diagnosis not present

## 2021-07-15 DIAGNOSIS — R770 Abnormality of albumin: Secondary | ICD-10-CM | POA: Diagnosis not present

## 2021-07-15 DIAGNOSIS — E785 Hyperlipidemia, unspecified: Secondary | ICD-10-CM | POA: Diagnosis not present

## 2021-07-15 DIAGNOSIS — C7931 Secondary malignant neoplasm of brain: Secondary | ICD-10-CM | POA: Diagnosis not present

## 2021-07-15 DIAGNOSIS — R531 Weakness: Secondary | ICD-10-CM | POA: Diagnosis not present

## 2021-07-15 DIAGNOSIS — D6481 Anemia due to antineoplastic chemotherapy: Secondary | ICD-10-CM | POA: Diagnosis not present

## 2021-07-15 DIAGNOSIS — Z5112 Encounter for antineoplastic immunotherapy: Secondary | ICD-10-CM | POA: Diagnosis not present

## 2021-07-15 DIAGNOSIS — D72819 Decreased white blood cell count, unspecified: Secondary | ICD-10-CM | POA: Diagnosis not present

## 2021-07-15 DIAGNOSIS — E1169 Type 2 diabetes mellitus with other specified complication: Secondary | ICD-10-CM | POA: Diagnosis not present

## 2021-07-15 LAB — GLUCOSE, CAPILLARY
Glucose-Capillary: 101 mg/dL — ABNORMAL HIGH (ref 70–99)
Glucose-Capillary: 93 mg/dL (ref 70–99)
Glucose-Capillary: 111 mg/dL — ABNORMAL HIGH (ref 70–99)

## 2021-07-15 MED ORDER — INSULIN ASPART 100 UNIT/ML IJ SOLN: 4.0000 [IU] | Freq: Three times a day (TID) | INTRAMUSCULAR | 11 refills | Status: DC

## 2021-07-15 MED ORDER — DEXAMETHASONE 4 MG PO TABS: 4.0000 mg | ORAL_TABLET | Freq: Two times a day (BID) | ORAL | 1 refills | Status: DC

## 2021-07-15 MED ORDER — INSULIN GLARGINE-YFGN 100 UNIT/ML ~~LOC~~ SOLN: 13.0000 [IU] | Freq: Every day | SUBCUTANEOUS | 11 refills | Status: DC

## 2021-07-15 MED ORDER — GABAPENTIN 100 MG PO CAPS: 100.0000 mg | ORAL_CAPSULE | Freq: Two times a day (BID) | ORAL | 0 refills | Status: DC

## 2021-07-15 MED ORDER — INSULIN GLARGINE-YFGN 100 UNIT/ML ~~LOC~~ SOLN
13.0000 [IU] | Freq: Every day | SUBCUTANEOUS | Status: DC
  Filled 2021-07-15: qty 0.13

## 2021-07-15 MED ORDER — DIPHENHYDRAMINE-ZINC ACETATE 2-0.1 % EX CREA: TOPICAL_CREAM | Freq: Two times a day (BID) | CUTANEOUS | 0 refills | Status: DC | PRN

## 2021-07-15 MED ORDER — AMLODIPINE BESYLATE 5 MG PO TABS
5.0000 mg | ORAL_TABLET | Freq: Every day | ORAL | 0 refills | Status: DC
Start: 2021-07-16 — End: 2021-08-20

## 2021-07-15 MED ORDER — INSULIN GLARGINE-YFGN 100 UNIT/ML ~~LOC~~ SOLN: 16.0000 [IU] | Freq: Every day | SUBCUTANEOUS | 11 refills | Status: DC

## 2021-07-15 MED ORDER — INSULIN GLARGINE-YFGN 100 UNIT/ML ~~LOC~~ SOLN
16.0000 [IU] | Freq: Every day | SUBCUTANEOUS | Status: DC
  Administered 2021-07-15: 16 [IU] via SUBCUTANEOUS
  Filled 2021-07-15: qty 0.16

## 2021-07-15 NOTE — Progress Notes (Signed)
Called facility and gave report to Faith, LPN, all questions and concerns addressed. Pt not in acute distress, discharged to SNF with belongings via [place]. ?

## 2021-07-15 NOTE — Plan of Care (Signed)

## 2021-07-15 NOTE — TOC Transition Note (Signed)
Transition of Care (TOC) - CM/SW Discharge Note ? ?Patient Details  ?Name: Benjamin Willis. ?MRN: [id number] ?Date of Birth: November 02, 1945 ? ?Transition of Care (TOC) CM/SW Contact:  ?Sherie Don, LCSW ?Phone Number: ?07/15/2021, 11:23 AM ? ?Clinical Narrative: CSW notified by Juliann Pulse with [facility] that insurance authorization has been approved. Discharge summary, discharge orders, and SNF transfer report faxed to facility in hub. Patient will go to room 111 and the number for report is [phone]. Negative COVID test from 3/10 is acceptable, so a new test is not needed. ? ?Medical necessity form done; PTAR scheduled. Discharge packet completed. CSW notified patient's sister, Jonn Shingles, of discharge. RN updated. TOC signing off. ? ?Final next level of care: [place] ?Barriers to Discharge: Barriers Resolved ? ?Patient Goals and CMS Choice ?Patient states their goals for this hospitalization and ongoing recovery are:: Go to rehab short-term ?CMS Medicare.gov Compare Post Acute Care list provided to:: Patient ?Choice offered to / list presented to : Patient ? ?Discharge Placement ?PASRR number recieved: 07/08/21      ?Patient chooses bed at: [facility] ?Patient to be transferred to facility by: PTAR ?Name of family member notified: Jonn Shingles (son) ?Patient and family notified of of transfer: 07/15/21 ? ?Discharge Plan and Services ?In-house Referral: Clinical Social Work ?Post Acute Care Choice: [place]          ?DME Arranged: N/A ?DME Agency: NA ? ?Readmission Risk Interventions ?Readmission Risk Prevention Plan 07/05/2021 01/10/2021  ?Transportation Screening Complete Complete  ?PCP or Specialist Appt within 3-5 Days Complete Complete  ?[place] or Home Care Consult Complete Complete  ?Social Work Consult for [place] Planning/Counseling Complete Complete  ?Palliative Care Screening Complete Not Applicable  ?Medication Review Press photographer) Complete Complete  ?Some recent  data might be hidden  ? ?

## 2021-07-15 NOTE — Discharge Summary (Signed)
Physician Discharge Summary   Patient: Benjamin Willis. MRN: [id number] DOB: 06-13-1945  Admit date:     07/02/2021  Discharge date: 07/15/21  Discharge Physician: Benjamin Willis   PCP: Benjamin Pollen, MD   Recommendations at discharge:    Needs to follow up with Dr Benjamin Willis and neuro-oncology. Patient needs repeat MRI in 1 months.  Monitor CBG.  Needs to follow up by Palliative care at SNF.   Discharge Diagnoses: Principal Problem:   Non-small cell lung cancer metastatic to brain [facility]) Active Problems:   Left leg weakness   Shingles rash   Fall at home, initial encounter   Goals of care, counseling/discussion   Dyslipidemia associated with type 2 diabetes mellitus ([place])   Hypertension   Diabetes mellitus type 2 in nonobese (HCC)   Glaucoma   Leukopenia with mild neutropenia   Anemia associated with chemotherapy   Cognitive impairment  Resolved Problems:   Lung cancer metastatic to brain [facility])   Metastasis to brain [name])  Hospital Course: 76 year old M with PMH of NSCLC of right lung with mets to brain s/p SBRT but did not wish to undergo SRS followed by Dr. Lorenso Willis presenting with generalized weakness, BLE weakness, tremors and fall when he tried to get out of the bed,  CT head and MRI brain with for metastatic deposits in brain (progressed since his MRI on 05/13/2021) increased surrounding edema in the right parietal lobe with evidence of prior hemorrhage with progression of hemorrhage into right cerebellar lesion, left occipital lesion and right parietal lesion.  Started on steroid.   Oncology, neurooncology and radiation oncology involved.  Patient was discussed on tumor board meeting.  Given improvement with steroid, the recommendation is to continue Decadron 4 mg daily and arrange outpatient repeat MRI.   Therapy recommended SNF.  However, patient developed shingles across right lower chest and abdomen.  Started on Valtrex and airborne precaution on 3/5.  Medically  stable for discharge if SNF can take him with airborne precaution or the lesions crust and he comes off airborne precaution.    Assessment and Plan: * Non-small cell lung cancer metastatic to brain [facility]) Patient presents with fall, bilateral leg weakness, left> right.  MRI brain with increased brain mets, edema, and evidence of prior hemorrhage with progression in the right cerebellar lesion, left occipital lesion or right parietal lesions.  Started on Decadron.  Motor 4/5 in BLE on my exam. -Oncology, neurooncology and radiation oncology involved.   -Per neurooncology-Decadron 4 mg every 12 hours and outpatient follow-up for repeat MRI -Continue home Keppra -Appreciate input by palliative care   Left leg weakness Likely from brain mets.  4/5 motor strength in BLE on my exam. -Decadron as above. -Continue PT/OT  Shingles rash Completed  Valtrex 1 g 3 times daily for 7 days on 3/5 Airborne and droplet precaution in a negative pressure room until lesions crust Started low dose gabapentin and benadryl cream.   Fall at home, initial encounter Likely due to brain mets.  No significant injury -Fall precaution -PT/OT  Cognitive impairment Seems to have underlying cognitive impairment.  He is only oriented times 3.  -Reorientation and delirium precaution -B 12:  1971  Anemia associated with chemotherapy Hb stable.     Leukopenia with mild neutropenia Resolved.  Glaucoma Continue home eyedrops  Diabetes mellitus type 2 in nonobese (HCC) Controlled with hyperglycemia.  A1c 6.7% on 05/01/2021. And Hypoglycemia.  -Continue SSI-moderate -Continue NovoLog 5 units 3 times daily -Continue holding home  glipizide, metformin and Januvia -Plan to decreased Semglee to 16-- due to hypoglycemia. His Bmet glucose was 24. Received glucose.  -Repeated CBG at 3 am . Today normal at 101. I will decrease night dose of levemir.    Hypertension BP controlled. Tolerating norvasc.    Dyslipidemia associated with type 2 diabetes mellitus ([place]) - Continue home statin.  Goals of care, counseling/discussion Patient with metastatic brain cancer as above.  Poor long-term prognosis.  -Palliative care signed off.  Recommended palliative follow-up at SNF         Consultants: oncology, neuro-oncology Procedures performed: MRI Disposition: Skilled nursing facility Diet recommendation:  Discharge Diet Orders (From admission, onward)     Start     Ordered   07/15/21 0000  Diet - low sodium heart healthy        07/15/21 1036           Cardiac diet DISCHARGE MEDICATION: Allergies as of 07/15/2021   No Known Allergies      Medication List     STOP taking these medications    ammonium lactate 12 % lotion Commonly known as: AmLactin   atorvastatin 20 MG tablet Commonly known as: LIPITOR   erythromycin ophthalmic ointment   glipiZIDE 5 MG tablet Commonly known as: GLUCOTROL   lidocaine-prilocaine cream Commonly known as: EMLA   ondansetron 8 MG tablet Commonly known as: ZOFRAN   prochlorperazine 10 MG tablet Commonly known as: COMPAZINE   sitaGLIPtin 100 MG tablet Commonly known as: Januvia       TAKE these medications    albuterol 108 (90 Base) MCG/ACT inhaler Commonly known as: VENTOLIN HFA Inhale 2 puffs into the lungs every 6 (six) hours as needed for wheezing or shortness of breath. Notes to patient: Resume home regimen   amLODipine 5 MG tablet Commonly known as: NORVASC Take 1 tablet (5 mg total) by mouth daily. Start taking on: July 16, 2021 What changed:  medication strength how much to take   blood glucose meter kit and supplies Kit Dispense based on patient and insurance preference. Use up to four times daily as directed.   Blood Pressure Monitor/Arm Devi Check BP once a day. DX I 10   dexamethasone 4 MG tablet Commonly known as: DECADRON Take 1 tablet (4 mg total) by mouth every 12 (twelve) hours. What changed:   medication strength how much to take when to take this   diphenhydrAMINE-zinc acetate cream Commonly known as: BENADRYL Apply topically 2 (two) times daily as needed for itching.   folic acid 1 MG tablet Commonly known as: FOLVITE Take 1 tablet (1 mg total) by mouth daily.   gabapentin 100 MG capsule Commonly known as: NEURONTIN Take 1 capsule (100 mg total) by mouth 2 (two) times daily. What changed:  medication strength how much to take when to take this   insulin aspart 100 UNIT/ML injection Commonly known as: novoLOG Inject 4 Units into the skin 3 (three) times daily with meals.   insulin glargine-yfgn 100 UNIT/ML injection Commonly known as: SEMGLEE Inject 0.13 mLs (13 Units total) into the skin at bedtime.   insulin glargine-yfgn 100 UNIT/ML injection Commonly known as: SEMGLEE Inject 0.16 mLs (16 Units total) into the skin daily. Start taking on: July 16, 2021   latanoprost 0.005 % ophthalmic solution Commonly known as: XALATAN Place 1 drop into both eyes at bedtime.   levETIRAcetam 1000 MG tablet Commonly known as: KEPPRA Take 1 tablet by mouth twice daily What changed: when to take  this   metFORMIN 500 MG tablet Commonly known as: GLUCOPHAGE TAKE 1 TABLET BY MOUTH TWICE DAILY WITH MEALS What changed: when to take this   multivitamin Tabs tablet Take 1 tablet by mouth daily with breakfast.   pantoprazole 40 MG tablet Commonly known as: PROTONIX Take 1 tablet (40 mg total) by mouth daily. What changed: when to take this   rosuvastatin 20 MG tablet Commonly known as: CRESTOR Take 20 mg by mouth daily.        Contact information for after-discharge care     Destination     [facility] Preferred SNF .   Service: Skilled Nursing Contact information: [address] [place] [phone]                    Discharge Exam: Danley Danker Weights   07/02/21 1721  Weight: 71.5 kg   General; Alert,  follows command CVS; S 1, S 2 RRR Lungs; CTA  Condition at discharge: stable  The results of significant diagnostics from this hospitalization (including imaging, microbiology, ancillary and laboratory) are listed below for reference.   Imaging Studies: CT Head Wo Contrast  Result Date: 07/02/2021 CLINICAL DATA:  High sudden onset of generalized weakness especially lower extremities, new onset tremors, fell trying to get out of bed, question stroke; history non-small cell lung cancer with radiation therapy, hypertension, diabetes mellitus EXAM: CT HEAD WITHOUT CONTRAST TECHNIQUE: Contiguous axial images were obtained from the base of the skull through the vertex without intravenous contrast. RADIATION DOSE REDUCTION: This exam was performed according to the departmental dose-optimization program which includes automated exposure control, adjustment of the mA and/or kV according to patient size and/or use of iterative reconstruction technique. COMPARISON:  10/15/2020 CT head, MR brain 05/13/2021 FINDINGS: Brain: Generalized atrophy. Normal ventricular morphology. No midline shift or mass effect. Small vessel chronic ischemic changes of deep cerebral white matter. Areas of vasogenic edema again identified within the RIGHT hemisphere. Area of higher attenuation at the high RIGHT parietal region question small hemorrhagic metastasis, seen previously. Additional small high attenuation focus LEFT occipital consistent with additional hemorrhagic metastasis. Both of these lesions were seen on prior MR. Small basal ganglia calcifications bilaterally. Additional small metastatic lesions seen previously at the inferior margin of the RIGHT occipital lobe on prior MR is not seen on current study. Vascular: Minimal atherosclerotic calcification of internal carotid arteries at skull base Skull: Intact Sinuses/Orbits: Opacified LEFT mastoid air cells. Mucosal thickening ethmoid air cells and sphenoid sinus. Other: N/A  IMPRESSION: Atrophy with small vessel chronic ischemic changes of deep cerebral white matter. Areas of vasogenic edema again identified within the RIGHT hemisphere. Small hemorrhagic metastases at the high RIGHT parietal lobe and LEFT occipital lobe. No additional areas of hemorrhage or infarction. Electronically Signed   By: Lavonia Dana M.D.   On: 07/02/2021 12:39   MR Brain W and Wo Contrast  Result Date: 07/02/2021 CLINICAL DATA:  Metastatic lung cancer.  Assess treatment response. EXAM: MRI HEAD WITHOUT AND WITH CONTRAST TECHNIQUE: Multiplanar, multiecho pulse sequences of the brain and surrounding structures were obtained without and with intravenous contrast. CONTRAST:  7.28m GADAVIST GADOBUTROL 1 MMOL/ML IV SOLN COMPARISON:  MRI head with contrast 05/13/2021 FINDINGS: Brain: 4 enhancing metastatic deposit in the brain, all lesions show progression. Right posterior cerebellar lesion now measures 13.5 mm with progression. Associated hemorrhage shows mild progression. Mild edema surrounding the lesion with progression. Left occipital lesion shows progression now measuring 11.5 mm in diameter.  Cystic necrosis. Progression of associated hemorrhage in the lesion. Progression of surrounding edema. Right medial parietal lobe lesion 22 mm in diameter with progression. Mild associated hemorrhage unchanged. Progression of surrounding vasogenic edema. Rim enhancing cystic lesion in the right parietal lobe measures 20.5 mm in diameter with progression. Progressive hemorrhage. Moderate vasogenic edema has progressed. No new lesions. Ventricle size normal.  No acute infarct.  No midline shift. Vascular: Normal arterial flow voids Skull and upper cervical spine: No metastatic disease to the calvarium. Sinuses/Orbits: Mild mucosal edema paranasal sinuses. Negative orbit Other: None IMPRESSION: 4 metastatic deposits in the brain show progression since MRI 05/13/2021. All lesions show interval growth. Increased surrounding  edema specially in the right parietal lobe. All lesions show evidence of prior hemorrhage with progression of hemorrhage in the right cerebellar lesion, left occipital lesion, and right parietal lesion. Electronically Signed   By: Franchot Gallo M.D.   On: 07/02/2021 15:26   CT CHEST ABDOMEN PELVIS W CONTRAST  Result Date: 06/18/2021 CLINICAL DATA:  History of metastatic non-small cell lung cancer, previous radiation therapy ongoing chemotherapy. EXAM: CT CHEST, ABDOMEN, AND PELVIS WITH CONTRAST TECHNIQUE: Multidetector CT imaging of the chest, abdomen and pelvis was performed following the standard protocol during bolus administration of intravenous contrast. RADIATION DOSE REDUCTION: This exam was performed according to the departmental dose-optimization program which includes automated exposure control, adjustment of the mA and/or kV according to patient size and/or use of iterative reconstruction technique. CONTRAST:  154m OMNIPAQUE IOHEXOL 300 MG/ML  SOLN COMPARISON:  Multiple priors including most recent CT March 05, 2021 FINDINGS: CT CHEST FINDINGS Cardiovascular: Right chest Port-A-Cath with tip near the superior cavoatrial junction. Aortic atherosclerosis without aneurysmal dilation. No central pulmonary embolus on this nondedicated study. Normal size heart. Small pericardial effusion slightly increased from prior. Mediastinum/Nodes: No supraclavicular adenopathy. Stable multinodular goiter.No pathologically enlarged mediastinal, hilar or axillary lymph nodes. The trachea and esophagus are grossly unremarkable. Lungs/Pleura: Similar ill-defined opacity in the central right upper lobe with associated mild to moderate central bronchiectasis without suspicious new nodular areas, consistent with post radiation change. No new suspicious pulmonary nodules or masses. No pleural effusion. No pneumothorax. Musculoskeletal: No chest wall mass or suspicious bone lesions identified. CT ABDOMEN PELVIS FINDINGS  Hepatobiliary: No suspicious hepatic lesion. Gallbladder is unremarkable. No biliary ductal dilation. Pancreas: No pancreatic ductal dilation or evidence of acute inflammation. Spleen: Normal in size without focal abnormality. Adrenals/Urinary Tract: Stable 13 mm right adrenal nodule. No hydronephrosis. Kidneys demonstrate symmetric enhancement excretion of contrast material. No solid enhancing renal mass. Urinary bladder is unremarkable for degree of distension. Stomach/Bowel: Radiopaque enteric contrast material traverses the hepatic flexure. Stomach is minimally distended limiting evaluation. No pathologic dilation of small or large bowel. Terminal ileum appears normal. Large colonic stool burden suggestive of constipation. No evidence of acute bowel inflammation. Vascular/Lymphatic: Aortic and branch vessel atherosclerosis without abdominal aortic aneurysm. No pathologically enlarged abdominal or pelvic lymph nodes. Reproductive: Prostate is unremarkable. Other: No significant abdominopelvic free fluid. No discrete peritoneal or omental nodularity. Tiny fat containing paraumbilical hernia. Musculoskeletal: Mild dextroconvex curvature of the thoracolumbar spine. Multilevel degenerative changes spine. No aggressive lytic or blastic lesion of bone. IMPRESSION: 1. Stable examination without evidence of new or progressive disease in the chest abdomen or pelvis to suggest recurrence or metastases. 2. Stable post radiation change in the central right upper lobe. 3. Stable 13 mm right adrenal nodule. 4. Large colonic stool burden suggestive of constipation. 5. Small pericardial effusion, slightly increased from prior  exam, possibly post treatment related consider further evaluation with echocardiography. 6. Aortic Atherosclerosis (ICD10-I70.0). Electronically Signed   By: Dahlia Bailiff M.D.   On: 06/18/2021 11:54    Microbiology: Results for orders placed or performed during the hospital encounter of 07/02/21  Resp  Panel by RT-PCR (Flu A&B, Covid) Nasopharyngeal Swab     Status: None   Collection Time: 07/02/21  3:43 PM   Specimen: Nasopharyngeal Swab; Nasopharyngeal(NP) swabs in vial transport medium  Result Value Ref Range Status   SARS Coronavirus 2 by RT PCR NEGATIVE NEGATIVE Final    Comment: (NOTE) SARS-CoV-2 target nucleic acids are NOT DETECTED.  The SARS-CoV-2 RNA is generally detectable in upper respiratory specimens during the acute phase of infection. The lowest concentration of SARS-CoV-2 viral copies this assay can detect is 138 copies/mL. A negative result does not preclude SARS-Cov-2 infection and should not be used as the sole basis for treatment or other patient management decisions. A negative result may occur with  improper specimen collection/handling, submission of specimen other than nasopharyngeal swab, presence of viral mutation(s) within the areas targeted by this assay, and inadequate number of viral copies(<138 copies/mL). A negative result must be combined with clinical observations, patient history, and epidemiological information. The expected result is Negative.  Fact Sheet for Patients:  EntrepreneurPulse.com.au  Fact Sheet for Healthcare Providers:  IncredibleEmployment.be  This test is no t yet approved or cleared by the [place] FDA and  has been authorized for detection and/or diagnosis of SARS-CoV-2 by FDA under an Emergency Use Authorization (EUA). This EUA will remain  in effect (meaning this test can be used) for the duration of the COVID-19 declaration under Section 564(b)(1) of the Act, 21 U.S.C.section 360bbb-3(b)(1), unless the authorization is terminated  or revoked sooner.       Influenza A by PCR NEGATIVE NEGATIVE Final   Influenza B by PCR NEGATIVE NEGATIVE Final    Comment: (NOTE) The Xpert Xpress SARS-CoV-2/FLU/RSV plus assay is intended as an aid in the diagnosis of influenza from Nasopharyngeal  swab specimens and should not be used as a sole basis for treatment. Nasal washings and aspirates are unacceptable for Xpert Xpress SARS-CoV-2/FLU/RSV testing.  Fact Sheet for Patients: EntrepreneurPulse.com.au  Fact Sheet for Healthcare Providers: IncredibleEmployment.be  This test is not yet approved or cleared by the [place] FDA and has been authorized for detection and/or diagnosis of SARS-CoV-2 by FDA under an Emergency Use Authorization (EUA). This EUA will remain in effect (meaning this test can be used) for the duration of the COVID-19 declaration under Section 564(b)(1) of the Act, 21 U.S.C. section 360bbb-3(b)(1), unless the authorization is terminated or revoked.  Performed at [facility], [place] [address] [place], [place] [address]   Varicella-zoster by PCR (Blood or Swab)     Status: Abnormal   Collection Time: 07/07/21  8:18 AM   Specimen: Blood  Result Value Ref Range Status   Varicella-Zoster, PCR Positive (A) Negative Final    Comment: (NOTE) Varicella Zoster Virus DNA detected. This test was developed and its performance characteristics determined by [facility].  It has not been cleared or approved by the Food and Drug Administration.  The FDA has determined that such clearance or approval is not necessary. Performed At: [facility] [place], [place] [id number] Rush Farmer MD [id number]   Resp Panel by RT-PCR (Flu A&B, Covid) Nasopharyngeal Swab     Status: None   Collection Time: 07/12/21 10:06 AM   Specimen:  Nasopharyngeal Swab; Nasopharyngeal(NP) swabs in vial transport medium  Result Value Ref Range Status   SARS Coronavirus 2 by RT PCR NEGATIVE NEGATIVE Final    Comment: (NOTE) SARS-CoV-2 target nucleic acids are NOT DETECTED.  The SARS-CoV-2 RNA is generally detectable in upper respiratory specimens during the acute phase of infection. The lowest concentration  of SARS-CoV-2 viral copies this assay can detect is 138 copies/mL. A negative result does not preclude SARS-Cov-2 infection and should not be used as the sole basis for treatment or other patient management decisions. A negative result may occur with  improper specimen collection/handling, submission of specimen other than nasopharyngeal swab, presence of viral mutation(s) within the areas targeted by this assay, and inadequate number of viral copies(<138 copies/mL). A negative result must be combined with clinical observations, patient history, and epidemiological information. The expected result is Negative.  Fact Sheet for Patients:  EntrepreneurPulse.com.au  Fact Sheet for Healthcare Providers:  IncredibleEmployment.be  This test is no t yet approved or cleared by the [place] FDA and  has been authorized for detection and/or diagnosis of SARS-CoV-2 by FDA under an Emergency Use Authorization (EUA). This EUA will remain  in effect (meaning this test can be used) for the duration of the COVID-19 declaration under Section 564(b)(1) of the Act, 21 U.S.C.section 360bbb-3(b)(1), unless the authorization is terminated  or revoked sooner.       Influenza A by PCR NEGATIVE NEGATIVE Final   Influenza B by PCR NEGATIVE NEGATIVE Final    Comment: (NOTE) The Xpert Xpress SARS-CoV-2/FLU/RSV plus assay is intended as an aid in the diagnosis of influenza from Nasopharyngeal swab specimens and should not be used as a sole basis for treatment. Nasal washings and aspirates are unacceptable for Xpert Xpress SARS-CoV-2/FLU/RSV testing.  Fact Sheet for Patients: EntrepreneurPulse.com.au  Fact Sheet for Healthcare Providers: IncredibleEmployment.be  This test is not yet approved or cleared by the [place] FDA and has been authorized for detection and/or diagnosis of SARS-CoV-2 by FDA under an Emergency Use  Authorization (EUA). This EUA will remain in effect (meaning this test can be used) for the duration of the COVID-19 declaration under Section 564(b)(1) of the Act, 21 U.S.C. section 360bbb-3(b)(1), unless the authorization is terminated or revoked.  Performed at [facility], [place] [address], [place], [place] [address]     Labs: CBC: Recent Labs  Lab 07/09/21 0331 07/14/21 0333  WBC 6.5 9.1  HGB 11.4* 11.2*  HCT 34.6* 34.5*  MCV 97.7 100.0  PLT 156 938   Basic Metabolic Panel: Recent Labs  Lab 07/09/21 0331 07/14/21 0333  NA 136 137  K 3.9 4.3  CL 102 105  CO2 28 27  GLUCOSE 125* 24*  BUN 24* 20  CREATININE 0.83 0.80  CALCIUM 8.6* 8.9  MG 2.0  --   PHOS 4.1  --    Liver Function Tests: Recent Labs  Lab 07/09/21 0331  ALBUMIN 3.1*   CBG: Recent Labs  Lab 07/14/21 1136 07/14/21 1626 07/14/21 2146 07/15/21 0346 07/15/21 0743  GLUCAP 137* 206* 184* 101* 93    Discharge time spent: greater than 30 minutes.  Signed: Elmarie Shiley, MD [facility] 07/15/2021

## 2021-07-16 ENCOUNTER — Encounter: Payer: Self-pay | Admitting: Hematology and Oncology

## 2021-07-16 ENCOUNTER — Ambulatory Visit: Payer: Medicare Other | Admitting: Emergency Medicine

## 2021-07-17 DIAGNOSIS — C3490 Malignant neoplasm of unspecified part of unspecified bronchus or lung: Secondary | ICD-10-CM | POA: Diagnosis not present

## 2021-07-17 DIAGNOSIS — R770 Abnormality of albumin: Secondary | ICD-10-CM | POA: Diagnosis not present

## 2021-07-17 DIAGNOSIS — T451X5A Adverse effect of antineoplastic and immunosuppressive drugs, initial encounter: Secondary | ICD-10-CM | POA: Diagnosis not present

## 2021-07-17 DIAGNOSIS — E785 Hyperlipidemia, unspecified: Secondary | ICD-10-CM | POA: Diagnosis not present

## 2021-07-17 DIAGNOSIS — D6481 Anemia due to antineoplastic chemotherapy: Secondary | ICD-10-CM | POA: Diagnosis not present

## 2021-07-17 DIAGNOSIS — G4089 Other seizures: Secondary | ICD-10-CM | POA: Diagnosis not present

## 2021-07-17 DIAGNOSIS — G936 Cerebral edema: Secondary | ICD-10-CM | POA: Diagnosis not present

## 2021-07-17 DIAGNOSIS — D708 Other neutropenia: Secondary | ICD-10-CM | POA: Diagnosis not present

## 2021-07-17 DIAGNOSIS — D72819 Decreased white blood cell count, unspecified: Secondary | ICD-10-CM | POA: Diagnosis not present

## 2021-07-18 DIAGNOSIS — C3490 Malignant neoplasm of unspecified part of unspecified bronchus or lung: Secondary | ICD-10-CM | POA: Diagnosis not present

## 2021-07-18 DIAGNOSIS — E785 Hyperlipidemia, unspecified: Secondary | ICD-10-CM | POA: Diagnosis not present

## 2021-07-18 DIAGNOSIS — R770 Abnormality of albumin: Secondary | ICD-10-CM | POA: Diagnosis not present

## 2021-07-18 DIAGNOSIS — G4089 Other seizures: Secondary | ICD-10-CM | POA: Diagnosis not present

## 2021-07-18 DIAGNOSIS — D72819 Decreased white blood cell count, unspecified: Secondary | ICD-10-CM | POA: Diagnosis not present

## 2021-07-18 DIAGNOSIS — G936 Cerebral edema: Secondary | ICD-10-CM | POA: Diagnosis not present

## 2021-07-18 DIAGNOSIS — D6481 Anemia due to antineoplastic chemotherapy: Secondary | ICD-10-CM | POA: Diagnosis not present

## 2021-07-18 DIAGNOSIS — T451X5A Adverse effect of antineoplastic and immunosuppressive drugs, initial encounter: Secondary | ICD-10-CM | POA: Diagnosis not present

## 2021-07-18 DIAGNOSIS — D708 Other neutropenia: Secondary | ICD-10-CM | POA: Diagnosis not present

## 2021-07-19 ENCOUNTER — Other Ambulatory Visit: Payer: Self-pay | Admitting: Hematology

## 2021-07-19 ENCOUNTER — Other Ambulatory Visit: Payer: Self-pay

## 2021-07-19 ENCOUNTER — Inpatient Hospital Stay (HOSPITAL_BASED_OUTPATIENT_CLINIC_OR_DEPARTMENT_OTHER): Payer: Medicare HMO | Admitting: Physician Assistant

## 2021-07-19 ENCOUNTER — Inpatient Hospital Stay: Payer: Medicare HMO

## 2021-07-19 VITALS — BP 125/87 | HR 71 | Temp 97.8°F | Resp 18 | Ht 66.0 in | Wt 162.0 lb

## 2021-07-19 DIAGNOSIS — C3411 Malignant neoplasm of upper lobe, right bronchus or lung: Secondary | ICD-10-CM | POA: Diagnosis not present

## 2021-07-19 DIAGNOSIS — Z95828 Presence of other vascular implants and grafts: Secondary | ICD-10-CM

## 2021-07-19 DIAGNOSIS — C7931 Secondary malignant neoplasm of brain: Secondary | ICD-10-CM | POA: Diagnosis not present

## 2021-07-19 DIAGNOSIS — E032 Hypothyroidism due to medicaments and other exogenous substances: Secondary | ICD-10-CM

## 2021-07-19 DIAGNOSIS — Z79899 Other long term (current) drug therapy: Secondary | ICD-10-CM | POA: Diagnosis not present

## 2021-07-19 DIAGNOSIS — C3490 Malignant neoplasm of unspecified part of unspecified bronchus or lung: Secondary | ICD-10-CM

## 2021-07-19 DIAGNOSIS — D6481 Anemia due to antineoplastic chemotherapy: Secondary | ICD-10-CM | POA: Diagnosis not present

## 2021-07-19 DIAGNOSIS — Z5111 Encounter for antineoplastic chemotherapy: Secondary | ICD-10-CM | POA: Diagnosis not present

## 2021-07-19 DIAGNOSIS — Z5112 Encounter for antineoplastic immunotherapy: Secondary | ICD-10-CM | POA: Diagnosis not present

## 2021-07-19 LAB — CBC WITH DIFFERENTIAL (CANCER CENTER ONLY)
Abs Immature Granulocytes: 0.04 10*3/uL (ref 0.00–0.07)
Basophils Absolute: 0 10*3/uL (ref 0.0–0.1)
Basophils Relative: 0 %
Eosinophils Absolute: 0 10*3/uL (ref 0.0–0.5)
Eosinophils Relative: 0 %
HCT: 34.7 % — ABNORMAL LOW (ref 39.0–52.0)
Hemoglobin: 11.4 g/dL — ABNORMAL LOW (ref 13.0–17.0)
Immature Granulocytes: 1 %
Lymphocytes Relative: 6 %
Lymphs Abs: 0.5 10*3/uL — ABNORMAL LOW (ref 0.7–4.0)
MCH: 32.6 pg (ref 26.0–34.0)
MCHC: 32.9 g/dL (ref 30.0–36.0)
MCV: 99.1 fL (ref 80.0–100.0)
Monocytes Absolute: 0.5 10*3/uL (ref 0.1–1.0)
Monocytes Relative: 6 %
Neutro Abs: 6.5 10*3/uL (ref 1.7–7.7)
Neutrophils Relative %: 87 %
Platelet Count: 283 10*3/uL (ref 150–400)
RBC: 3.5 MIL/uL — ABNORMAL LOW (ref 4.22–5.81)
RDW: 14.5 % (ref 11.5–15.5)
WBC Count: 7.5 10*3/uL (ref 4.0–10.5)
nRBC: 0 % (ref 0.0–0.2)

## 2021-07-19 LAB — CMP (CANCER CENTER ONLY)
ALT: 118 U/L — ABNORMAL HIGH (ref 0–44)
AST: 28 U/L (ref 15–41)
Albumin: 3.8 g/dL (ref 3.5–5.0)
Alkaline Phosphatase: 71 U/L (ref 38–126)
Anion gap: 6 (ref 5–15)
BUN: 19 mg/dL (ref 8–23)
CO2: 29 mmol/L (ref 22–32)
Calcium: 9.2 mg/dL (ref 8.9–10.3)
Chloride: 101 mmol/L (ref 98–111)
Creatinine: 0.77 mg/dL (ref 0.61–1.24)
GFR, Estimated: >60 mL/min (ref >60)
Glucose, Bld: 260 mg/dL — ABNORMAL HIGH (ref 70–99)
Potassium: 4.5 mmol/L (ref 3.5–5.1)
Sodium: 136 mmol/L (ref 135–145)
Total Bilirubin: 0.4 mg/dL (ref 0.3–1.2)
Total Protein: 6.5 g/dL (ref 6.5–8.1)

## 2021-07-19 LAB — TSH: TSH: 0.452 u[IU]/mL (ref 0.320–4.118)

## 2021-07-19 MED ORDER — SODIUM CHLORIDE 0.9 % IV SOLN: Freq: Once | INTRAVENOUS | Status: AC

## 2021-07-19 MED ORDER — CYANOCOBALAMIN 1000 MCG/ML IJ SOLN
1000.0000 ug | Freq: Once | INTRAMUSCULAR | Status: AC
  Administered 2021-07-19: 1000 ug via INTRAMUSCULAR
  Filled 2021-07-19: qty 1

## 2021-07-19 MED ORDER — PROCHLORPERAZINE MALEATE 10 MG PO TABS
10.0000 mg | ORAL_TABLET | Freq: Once | ORAL | Status: AC
  Administered 2021-07-19: 10 mg via ORAL
  Filled 2021-07-19: qty 1

## 2021-07-19 MED ORDER — SODIUM CHLORIDE 0.9 % IV SOLN
200.0000 mg | Freq: Once | INTRAVENOUS | Status: AC
  Administered 2021-07-19: 200 mg via INTRAVENOUS
  Filled 2021-07-19: qty 200

## 2021-07-19 MED ORDER — SODIUM CHLORIDE 0.9% FLUSH
10.0000 mL | Freq: Once | INTRAVENOUS | Status: AC
  Administered 2021-07-19: 10 mL

## 2021-07-19 MED ORDER — SODIUM CHLORIDE 0.9 % IV SOLN
500.0000 mg/m2 | Freq: Once | INTRAVENOUS | Status: AC
  Administered 2021-07-19: 900 mg via INTRAVENOUS
  Filled 2021-07-19: qty 20

## 2021-07-19 NOTE — Patient Instructions (Addendum)
[facility]  Discharge Instructions: Thank you for choosing [facility] to provide your oncology and hematology care.   If you have a lab appointment with the Cancer Center, please go directly to the Cancer Center and check in at the registration area.   Wear comfortable clothing and clothing appropriate for easy access to any Portacath or PICC line.   We strive to give you quality time with your provider. You may need to reschedule your appointment if you arrive late (15 or more minutes).  Arriving late affects you and other patients whose appointments are after yours.  Also, if you miss three or more appointments without notifying the office, you may be dismissed from the clinic at the provider's discretion.      For prescription refill requests, have your pharmacy contact our office and allow 72 hours for refills to be completed.    Today you received the following chemotherapy and/or immunotherapy agents Pembrolizumab (Keytruda) and Pemetrexed (Alimta)      To help prevent nausea and vomiting after your treatment, we encourage you to take your nausea medication as directed.  BELOW ARE SYMPTOMS THAT SHOULD BE REPORTED IMMEDIATELY: *FEVER GREATER THAN 100.4 F (38 C) OR HIGHER *CHILLS OR SWEATING *NAUSEA AND VOMITING THAT IS NOT CONTROLLED WITH YOUR NAUSEA MEDICATION *UNUSUAL SHORTNESS OF BREATH *UNUSUAL BRUISING OR BLEEDING *URINARY PROBLEMS (pain or burning when urinating, or frequent urination) *BOWEL PROBLEMS (unusual diarrhea, constipation, pain near the anus) TENDERNESS IN MOUTH AND THROAT WITH OR WITHOUT PRESENCE OF ULCERS (sore throat, sores in mouth, or a toothache) UNUSUAL RASH, SWELLING OR PAIN  UNUSUAL VAGINAL DISCHARGE OR ITCHING   Items with * indicate a potential emergency and should be followed up as soon as possible or go to the Emergency Department if any problems should occur.  Please show the CHEMOTHERAPY ALERT CARD or  IMMUNOTHERAPY ALERT CARD at check-in to the Emergency Department and triage nurse.  Should you have questions after your visit or need to cancel or reschedule your appointment, please contact [facility]  Dept: [phone]  and follow the prompts.  Office hours are 8:00 a.m. to 4:30 p.m. Monday - Friday. Please note that voicemails left after 4:00 p.m. may not be returned until the following business day.  We are closed weekends and major holidays. You have access to a nurse at all times for urgent questions. Please call the main number to the clinic Dept: [phone] and follow the prompts.   For any non-urgent questions, you may also contact your provider using MyChart. We now offer e-Visits for anyone 18 and older to request care online for non-urgent symptoms. For details visit [email or web address].   Also download the MyChart app! Go to the app store, search "MyChart", open the app, select [facility], and log in with your MyChart username and password.  Due to Covid, a mask is required upon entering the hospital/clinic. If you do not have a mask, one will be given to you upon arrival. For doctor visits, patients may have 1 support person aged 18 or older with them. For treatment visits, patients cannot have anyone with them due to current Covid guidelines and our immunocompromised population.   

## 2021-07-19 NOTE — Progress Notes (Signed)
Ok to treat today with alt of 118 per Dr. Irene Limbo  ?

## 2021-07-19 NOTE — Progress Notes (Signed)
Patient stated that he is not taking Dexamethasone. ? ?Benjamin Willis, RPH, BCPS, BCOP ?07/19/2021 ?2:41 PM ? ?

## 2021-07-22 ENCOUNTER — Encounter: Payer: Self-pay | Admitting: Hematology and Oncology

## 2021-07-22 DIAGNOSIS — C7931 Secondary malignant neoplasm of brain: Secondary | ICD-10-CM | POA: Diagnosis not present

## 2021-07-22 DIAGNOSIS — E1169 Type 2 diabetes mellitus with other specified complication: Secondary | ICD-10-CM | POA: Diagnosis not present

## 2021-07-22 DIAGNOSIS — B029 Zoster without complications: Secondary | ICD-10-CM | POA: Diagnosis not present

## 2021-07-22 DIAGNOSIS — W19XXXD Unspecified fall, subsequent encounter: Secondary | ICD-10-CM | POA: Diagnosis not present

## 2021-07-22 NOTE — Progress Notes (Signed)
?[facility] ?[phone]   [phone] ? ?PROGRESS NOTE ? ?Patient Care Team: ?Horald Pollen, MD as PCP - General (Internal Medicine) ?Joretta Bachelor, PA as Physician Assistant (Physician Assistant) ? ?Hematological/Oncological History ?# Stage IA2 (cT1b, N0) non-small cell lung cancer (adenocarcinoma) ?-- SBRT to the right lung on 02/21/2019 through 02/28/2019 followed by radiation to additional lung nodules  ? 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  This was followed by an MRI of the brain with and without contrast which showed 4 brain lesions consistent with metastases, extensive vasogenic edema associated with the posterior right frontal lesion without midline shift. ?6/15-6/28/2022: 10 fractions of WBRT with Rad/Onc ?11/23/2020: Guardant 360 returns with no targetable mutation.  ?12/14/2020: Cycle 1 Day 1 of Carbo/Pem/Pem ?01/04/2021: Cycle 2 Day 1 of Carbo/Pem/Pem ?01/25/2021: Cycle 3 Day 1 of Carbo/Pem/Pem ?02/15/2021: Cycle 4 Day 1 of Carbo/Pem/Pem ?03/08/2021: Cycle 5 Day 1 of maintenance Pem/Pem ?03/29/2021: Cycle 6 Day 1 of maintenance Pem/Pem ?04/19/2021: Cycle 7 Day 1 of maintenance Pem/Pem ?05/16/2021: Cycle 8 Day 1 of maintenance Pem/Pem ?06/07/2021: Cycle 9 Day 1 of maintenance Pem/Pem ?06/28/2021: Cycle 10 Day 1 of maintenance Pem/Pem ?07/19/2021: Cycle 11 Day 1 of maintenance Pem/Pem (delayed 2/2 hospitalization due to worsening brain metastases) ? ? ?Interval History:  ?Benjamin Willis. 76 y.o. 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 The patient's last visit was on 06/28/2021.  In the interim since the last visit he has completed Cycle 10 of maintenance chemotherapy. He presents today  for Cycle 11 Day 1.  ? ?Mr. Auletta is accompanied by his wife for this visit. He is currently residing in [facility] after being discharged from the hospital on 07/15/2021, During hospitalization patient underwent CT head and MRI brian that confirmed increased surrounding edema in the right parietal lobe with evidence of prior hemorrhage with progression of hemorrhage into right cerebellar lesion, left occipital lesion and right parietal lesion. Patient was started on steroids and given improvement, recommended to continued decadron 4 mg daily.  ? ?On exam today, Mr. Pettry reports that his energy levels are slowly improving since hospital discharge. He uses a walker to ambulate. He has a good appetite without any noticeable weight changes. He denies any nausea, vomiting or abdominal pain. His bowel habits are stable without any recurrent episodes of diarrhea or constipation. He continues to have some weakness in the left lower extremity. He denies any headaches, vision changes, lightheadedness, or dizziness.  He has no questions concerns or complaints today.  He currently denies any issues with fevers, chills, night sweats, shortness of breath, chest pain or cough.   A full 10 point ROS is listed below. ? ?MEDICAL HISTORY:  ?Past Medical History:  ?Diagnosis Date  ? Allergy   ? Diabetes mellitus without complication ([place])   ? Type II  ? Glaucoma 10/16/2020  ? History of radiation therapy   ? [place]- SBRT 02/21/19-02/28/19, Rt Lung- IMRT 02/29/20-04/16/20 Dr. Gery Pray  ? History of radiation therapy 10/30/2020  ? whole brain 10/17/2020-10/30/2020 Dr Gery Pray  ? Hyperlipidemia   ? Hypertension   ? Lung cancer ([place])   ? Stage IA2 (cT1b, N0) non-small cell lung cancer (adenocarcinoma)  ? ? ?SURGICAL HISTORY: ?Past Surgical History:  ?Procedure  Laterality Date  ? ELECTROMAGNETIC NAVIGATION BROCHOSCOPY Right 02/02/2019  ? Procedure: VIDEO BRONCHOSCOPY WITH NAVIGATION AND FIDUCIAL PLACEMENT;   Surgeon: Garner Nash, DO;  Location: [facility];  Service: Cardiopulmonary;  Laterality: Right;  ? FUDUCIAL PLACEMENT Right 02/02/2019  ? Procedure: Placement Of Fuducial Right Upper Lobe;  Surgeon: Garner Nash, DO;  Location: [facility];  Service: Cardiopulmonary;  Laterality: Right;  ? IR IMAGING GUIDED PORT INSERTION  12/13/2020  ? none    ? VIDEO BRONCHOSCOPY WITH RADIAL ENDOBRONCHIAL ULTRASOUND Right 02/02/2019  ? Procedure: Video Bronchoscopy With Radial Endobronchial Ultrasound;  Surgeon: Garner Nash, DO;  Location: [facility];  Service: Cardiopulmonary;  Laterality: Right;  ? ? ?SOCIAL HISTORY: ?Social History  ? ?Socioeconomic History  ? Marital status: Single  ?  Spouse name: Not on file  ? Number of children: 0  ? Years of education: Not on file  ? Highest education level: 9th grade  ?Occupational History  ? Not on file  ?Tobacco Use  ? Smoking status: Former  ?  Packs/day: 1.00  ?  Years: 48.00  ?  Pack years: 48.00  ?  Types: Cigarettes  ?  Quit date: 02/20/2015  ?  Years since quitting: 6.4  ? Smokeless tobacco: Former  ?  Quit date: 10/2013  ?Vaping Use  ? Vaping Use: Never used  ?Substance and Sexual Activity  ? Alcohol use: No  ? Drug use: No  ? Sexual activity: Yes  ?  Birth control/protection: Condom  ?Other Topics Concern  ? Not on file  ?Social History Narrative  ? Not on file  ? ?Social Determinants of Health  ? ?Financial Resource Strain: Not on file  ?Food Insecurity: Not on file  ?Transportation Needs: Not on file  ?Physical Activity: Not on file  ?Stress: Not on file  ?Social Connections: Not on file  ?Intimate Partner Violence: Not on file  ? ? ?FAMILY HISTORY: ?Family History  ?Problem Relation Age of Onset  ? Lung cancer Neg Hx   ? ? ?ALLERGIES:  has No Known Allergies. ? ?MEDICATIONS:  ?Current Outpatient Medications  ?Medication Sig Dispense Refill  ? albuterol (VENTOLIN HFA) 108 (90 Base) MCG/ACT inhaler Inhale 2 puffs into the lungs every 6 (six) hours as needed for wheezing or  shortness of breath. 18 g 6  ? amLODipine (NORVASC) 5 MG tablet Take 1 tablet (5 mg total) by mouth daily. 30 tablet 0  ? blood glucose meter kit and supplies KIT Dispense based on patient and insurance preference. Use up to four times daily as directed. 1 each 0  ? Blood Pressure Monitoring (BLOOD PRESSURE MONITOR/ARM) DEVI Check BP once a day. DX I 10 1 each 0  ? dexamethasone (DECADRON) 4 MG tablet Take 1 tablet (4 mg total) by mouth every 12 (twelve) hours. 60 tablet 1  ? diphenhydrAMINE-zinc acetate (BENADRYL) cream Apply topically 2 (two) times daily as needed for itching. 83.2 g 0  ? folic acid (FOLVITE) 1 MG tablet Take 1 tablet (1 mg total) by mouth daily. 90 tablet 1  ? gabapentin (NEURONTIN) 100 MG capsule Take 1 capsule (100 mg total) by mouth 2 (two) times daily. 60 capsule 0  ? insulin aspart (NOVOLOG) 100 UNIT/ML injection Inject 4 Units into the skin 3 (three) times daily with meals. 10 mL 11  ? insulin glargine-yfgn (SEMGLEE) 100 UNIT/ML injection Inject 0.16 mLs (16 Units total) into the skin daily. 10 mL 11  ? insulin glargine-yfgn (SEMGLEE) 100 UNIT/ML injection Inject 0.13  mLs (13 Units total) into the skin at bedtime. 10 mL 11  ? Insulin Lispro (HUMALOG IJ) Inject as directed. Per sliding scale    ? latanoprost (XALATAN) 0.005 % ophthalmic solution Place 1 drop into both eyes at bedtime. 2.5 mL 3  ? levETIRAcetam (KEPPRA) 1000 MG tablet Take 1 tablet by mouth twice daily (Patient taking differently: Take 1,000 mg by mouth in the morning and at bedtime.) 180 tablet 1  ? metFORMIN (GLUCOPHAGE) 500 MG tablet TAKE 1 TABLET BY MOUTH TWICE DAILY WITH MEALS (Patient taking differently: Take 500 mg by mouth 2 (two) times daily with a meal.) 180 tablet 0  ? multivitamin (ONE-A-DAY MEN'S) TABS tablet Take 1 tablet by mouth daily with breakfast.    ? pantoprazole (PROTONIX) 40 MG tablet Take 1 tablet (40 mg total) by mouth daily. (Patient taking differently: Take 40 mg by mouth daily before breakfast.) 90  tablet 1  ? rosuvastatin (CRESTOR) 20 MG tablet Take 20 mg by mouth daily.    ? ?No current facility-administered medications for this visit.  ? ? ?REVIEW OF SYSTEMS:   ?Constitutional: ( - ) fevers, ( - )  chill

## 2021-07-23 ENCOUNTER — Other Ambulatory Visit: Payer: Self-pay | Admitting: *Deleted

## 2021-07-23 ENCOUNTER — Non-Acute Institutional Stay: Payer: Medicare HMO | Admitting: Hospice

## 2021-07-23 ENCOUNTER — Other Ambulatory Visit: Payer: Self-pay

## 2021-07-23 DIAGNOSIS — C3490 Malignant neoplasm of unspecified part of unspecified bronchus or lung: Secondary | ICD-10-CM

## 2021-07-23 DIAGNOSIS — R531 Weakness: Secondary | ICD-10-CM

## 2021-07-23 DIAGNOSIS — G936 Cerebral edema: Secondary | ICD-10-CM

## 2021-07-23 DIAGNOSIS — E119 Type 2 diabetes mellitus without complications: Secondary | ICD-10-CM

## 2021-07-23 DIAGNOSIS — Z515 Encounter for palliative care: Secondary | ICD-10-CM

## 2021-07-23 NOTE — Progress Notes (Signed)
Ventura Sellers, MD  Wilmon Arms, RN ?He is active.  Let's keep 08/12/21 scan and adjust follow up for after that MRI.     ?  ?   ?Previous Messages ?  ?----- Message -----  ?From: Wilmon Arms, RN  ?Sent: 07/22/2021   2:32 PM EDT  ?To: Ventura Sellers, MD  ?Subject: FW: pt successfully d/cd to SNF                ? ?Per your last office note patient is due for MRI SRS protocol 08/12/2021.  He had another MRI done after your visit 07/02/2021.  He is post [facility] follow up.  Do you want to keep expected date or adjust that date?  ? ? ?He was scheduled to see you on 07/15/2021 but he inpatient.  Cancellation note states "Provider (MRI done as an IP and reviewed last week during conference. Plans are to continue steroids and rescan in one month with a perfusion MRI)"  ? ? ?-Kim  ?----- Message -----  ?From: Pincus Large  ?Sent: 07/17/2021  11:08 AM EDT  ?To: Wilmon Arms, RN  ?Subject: pt successfully d/cd to SNF                    ? ?Just letting you know that this patient has been discharged.  ? ?Manuela Schwartz  ?

## 2021-07-23 NOTE — Progress Notes (Addendum)
? ? ?Manufacturing engineer ?Community Palliative Care Follow-Up Visit ?Telephone: [phone]  ?Fax: [phone]  ? ?Date of encounter: 07/23/21 ?12:39 PM ?PATIENT NAME: Benjamin Willis. ?[address] ?[place] [address]   ?[phone] (home)  ?DOB: 1946-03-27 ?MRN: [id number] ?PRIMARY CARE PROVIDER:    ?Horald Pollen, MD,  ?[address] ?[place] [address] ?[phone] ? ?REFERRING PROVIDER:   ?Horald Pollen, MD ?[address] ?[place],  [place] [address] ?[phone] ? ?RESPONSIBLE PARTY:   Self/Deanna ?Contact Information   ? ? Name Relation Home Work Mobile  ? Benjamin Willis Sister [phone]  [phone]  ? ?  ? ? ?I met face to face with patient at the facility. Palliative Care was asked to follow this patient by consultation request of  Horald Pollen, *to address advance care planning and complex medical decision making. Patient endorsed palliative service. NP called his sister Lenore Manner and updated her on visit. She expressed appreciation for the update. This is follow-up visit.  ? ? ?                                 ASSESSMENT AND PLAN / RECOMMENDATIONS:  ? ?Advance Care Planning/Code status: Patient is a Full code ?Goals of Care: Goals include to maximize quality of Willis and symptom management ? ?Symptom Management/Plan: ? Lung cancer metastatic to brain: Patient continues on chemotherapy, tolerating his chemotherapy well.  Follow-up with oncologist as planned.   ?Generalized weakness: PT/OT is ongoing for strengthening and mobility/gait training. Currently ambulates with rolling walker; high fall risk ?Cerebral edema: secondary to malignant neoplasm of brain. Continue  Decadron 4 mg BID. Follow up with Oncologist and Neurologist as planned. Plan is for outpatient follow up MRI. ? ?Diabetes mellitus type 2 ?Continue metformin and januvia as ordered, ACHS glucose monitoring. Repeat A1c in 3 months.  ?Lab Results  ?Component Value Date  ? HGBA1C 8.3 (H)  07/05/2021  ? ?Follow up Palliative Care Visit: Palliative care will continue to follow for complex medical decision making, advance care planning, and clarification of goals. Return 4-6 weeks or prn. ? ?HOSPICE ELIGIBILITY/DIAGNOSIS: TBD ? ?Chief Complaint: Follow-up palliative visit ? ?HISTORY OF PRESENT ILLNESS:  Benjamin Willis. is a 76 y.o. year old male  with lung cancer metastatic to brain, DMII with neuropathy, htn, left hemiparesis, glaucoma,  gerd, hyperlipidemia. Hospitalization for 13 days 2/28 - 07/15/21 for fall/bil leg weakness found to be related to brain mets, brain edema. History obtained from review of EMR, discussion with primary team, and interview with family, facility staff/caregiver and/or Mr. Ulbrich.  ?I reviewed available labs, medications, imaging, studies and related documents from the EMR.  Records reviewed and summarized above.  ? ?ROS ? ?General: NAD ?EYES: denies vision changes, actually wearing his glasses which I'd never seen ?ENMT: denies dysphagia ?Cardiovascular: denies chest pain, denies DOE ?Pulmonary: denies cough, denies increased SOB ?Abdomen: endorses good appetite, denies constipation, endorses continence of bowel ?GU: denies dysuria, endorses continence of urine ?MSK:  denies increased weakness,  no falls reported ?Skin: denies rashes or wounds ?Neurological: denies pain, denies insomnia ?Psych: Endorses positive mood ?Heme/lymph/immuno: denies bruises, abnormal bleeding ? ?Physical Exam: ?Constitutional: NAD ?General:  well groomed ?EYES: anicteric sclera, lids intact, no discharge , glasses ?ENMT: intact hearing, oral mucous membranes moist, dentition intact ?CV: S1S2, RRR, no edema in BLE ?Pulmonary: LCTA, no increased work of breathing, no cough, room air ?Abdomen:normo-active BS + 4 quadrants,  soft and non tender, no ascites ?MWU:XLKGM all extremities, ambulatory with rolling walker ?Skin: warm and dry, no rashes or wounds on visible skin ?Neuro:  no generalized  weakness,  has cognitive impairment and some disinhibition noted ?Psych: non-anxious affect, mild memory loss/confusion ?Hem/lymph/immuno: no widespread bruising ? ?CURRENT PROBLEM LIST:  ?Patient Active Problem List  ? Diagnosis Date Noted  ? Willis rash 07/07/2021  ? Cognitive impairment 07/04/2021  ? Fall at home, initial encounter 07/03/2021  ? Left leg weakness 07/03/2021  ? Leukopenia with mild neutropenia 07/02/2021  ? Anemia associated with chemotherapy 07/02/2021  ? Non-small cell lung cancer metastatic to brain [facility]) 04/01/2021  ? Port-A-Cath in place 12/14/2020  ? Brain metastases ([place])   ? Palliative care by specialist   ? Metastatic lung cancer (metastasis from lung to other site) [facility]) 10/16/2020  ? Diabetes mellitus type 2 in nonobese ([place]) 10/16/2020  ? Glaucoma 10/16/2020  ? Atherosclerosis of aorta ([place]) 05/31/2020  ? Primary cancer of right upper lobe of lung ([place]) 02/07/2019  ? Lung nodule 02/02/2019  ? Hypertension 05/24/2018  ? Dyslipidemia 05/24/2018  ? Dyslipidemia associated with type 2 diabetes mellitus ([place]) 06/09/2017  ? Goals of care, counseling/discussion 01/01/2017  ? ?PAST MEDICAL HISTORY:  ?Active Ambulatory Problems  ?  Diagnosis Date Noted  ? Goals of care, counseling/discussion 01/01/2017  ? Dyslipidemia associated with type 2 diabetes mellitus ([place]) 06/09/2017  ? Hypertension 05/24/2018  ? Dyslipidemia 05/24/2018  ? Lung nodule 02/02/2019  ? Primary cancer of right upper lobe of lung ([place]) 02/07/2019  ? Atherosclerosis of aorta ([place]) 05/31/2020  ? Metastatic lung cancer (metastasis from lung to other site) [facility]) 10/16/2020  ? Diabetes mellitus type 2 in nonobese ([place]) 10/16/2020  ? Glaucoma 10/16/2020  ? Brain metastases ([place])   ? Palliative care by specialist   ? Port-A-Cath in place 12/14/2020  ? Non-small cell lung cancer metastatic to brain [facility]) 04/01/2021  ? Leukopenia with mild neutropenia 07/02/2021  ? Anemia associated with chemotherapy 07/02/2021  ? Fall at home,  initial encounter 07/03/2021  ? Left leg weakness 07/03/2021  ? Cognitive impairment 07/04/2021  ? Willis rash 07/07/2021  ? ?Resolved Ambulatory Problems  ?  Diagnosis Date Noted  ? Acute conjunctivitis of both eyes 01/01/2017  ? Elevated blood pressure reading in office without diagnosis of hypertension 01/01/2017  ? Lung cancer metastatic to brain [facility]) 10/16/2020  ? SIRS (systemic inflammatory response syndrome) ([place]) 01/07/2021  ? E. coli urinary tract infection 01/07/2021  ? Metastasis to brain [facility]) 07/02/2021  ? ?Past Medical History:  ?Diagnosis Date  ? Allergy   ? Diabetes mellitus without complication ([place])   ? History of radiation therapy   ? History of radiation therapy 10/30/2020  ? Hyperlipidemia   ? Lung cancer ([place])   ? ?SOCIAL HX:  ?Social History  ? ?Tobacco Use  ? Smoking status: Former  ?  Packs/day: 1.00  ?  Years: 48.00  ?  Pack years: 48.00  ?  Types: Cigarettes  ?  Quit date: 02/20/2015  ?  Years since quitting: 6.4  ? Smokeless tobacco: Former  ?  Quit date: 10/2013  ?Substance Use Topics  ? Alcohol use: No  ? ? ? ?ALLERGIES: No Known Allergies   ?PERTINENT MEDICATIONS:  ?Outpatient Encounter Medications as of 07/23/2021  ?Medication Sig  ? albuterol (VENTOLIN HFA) 108 (90 Base) MCG/ACT inhaler Inhale 2 puffs into the lungs every 6 (six) hours as needed for wheezing or shortness of breath.  ? amLODipine (  NORVASC) 5 MG tablet Take 1 tablet (5 mg total) by mouth daily.  ? blood glucose meter kit and supplies KIT Dispense based on patient and insurance preference. Use up to four times daily as directed.  ? Blood Pressure Monitoring (BLOOD PRESSURE MONITOR/ARM) DEVI Check BP once a day. DX I 10  ? dexamethasone (DECADRON) 4 MG tablet Take 1 tablet (4 mg total) by mouth every 12 (twelve) hours.  ? diphenhydrAMINE-zinc acetate (BENADRYL) cream Apply topically 2 (two) times daily as needed for itching.  ? folic acid (FOLVITE) 1 MG tablet Take 1 tablet (1 mg total) by mouth daily.  ? gabapentin  (NEURONTIN) 100 MG capsule Take 1 capsule (100 mg total) by mouth 2 (two) times daily.  ? insulin aspart (NOVOLOG) 100 UNIT/ML injection Inject 4 Units into the skin 3 (three) times daily with meals.  ? insulin glargi

## 2021-07-29 ENCOUNTER — Telehealth: Payer: Self-pay | Admitting: *Deleted

## 2021-07-29 NOTE — Telephone Encounter (Signed)
[facility] has reached the patient in attempts to schedule MRI however, family states patient is in rehab and doesn't want to schedule until he is out of rehab [facility]. ? ? ?

## 2021-07-30 ENCOUNTER — Ambulatory Visit: Payer: Medicare Other | Admitting: Emergency Medicine

## 2021-07-31 ENCOUNTER — Other Ambulatory Visit: Payer: Self-pay | Admitting: Emergency Medicine

## 2021-07-31 DIAGNOSIS — E1159 Type 2 diabetes mellitus with other circulatory complications: Secondary | ICD-10-CM

## 2021-08-02 DIAGNOSIS — T451X5A Adverse effect of antineoplastic and immunosuppressive drugs, initial encounter: Secondary | ICD-10-CM | POA: Diagnosis not present

## 2021-08-02 DIAGNOSIS — G4089 Other seizures: Secondary | ICD-10-CM | POA: Diagnosis not present

## 2021-08-02 DIAGNOSIS — G936 Cerebral edema: Secondary | ICD-10-CM | POA: Diagnosis not present

## 2021-08-02 DIAGNOSIS — C3490 Malignant neoplasm of unspecified part of unspecified bronchus or lung: Secondary | ICD-10-CM | POA: Diagnosis not present

## 2021-08-02 DIAGNOSIS — D6481 Anemia due to antineoplastic chemotherapy: Secondary | ICD-10-CM | POA: Diagnosis not present

## 2021-08-02 DIAGNOSIS — D708 Other neutropenia: Secondary | ICD-10-CM | POA: Diagnosis not present

## 2021-08-02 DIAGNOSIS — R770 Abnormality of albumin: Secondary | ICD-10-CM | POA: Diagnosis not present

## 2021-08-02 DIAGNOSIS — E785 Hyperlipidemia, unspecified: Secondary | ICD-10-CM | POA: Diagnosis not present

## 2021-08-02 DIAGNOSIS — D72819 Decreased white blood cell count, unspecified: Secondary | ICD-10-CM | POA: Diagnosis not present

## 2021-08-09 ENCOUNTER — Inpatient Hospital Stay: Payer: Medicare HMO

## 2021-08-09 ENCOUNTER — Inpatient Hospital Stay: Payer: Medicare HMO | Attending: Hematology and Oncology

## 2021-08-09 ENCOUNTER — Inpatient Hospital Stay (HOSPITAL_BASED_OUTPATIENT_CLINIC_OR_DEPARTMENT_OTHER): Payer: Medicare HMO | Admitting: Hematology and Oncology

## 2021-08-09 ENCOUNTER — Other Ambulatory Visit: Payer: Self-pay

## 2021-08-09 ENCOUNTER — Encounter: Payer: Self-pay | Admitting: Hematology and Oncology

## 2021-08-09 VITALS — BP 150/80 | HR 97 | Temp 97.9°F | Wt 154.7 lb

## 2021-08-09 DIAGNOSIS — Z95828 Presence of other vascular implants and grafts: Secondary | ICD-10-CM

## 2021-08-09 DIAGNOSIS — C3490 Malignant neoplasm of unspecified part of unspecified bronchus or lung: Secondary | ICD-10-CM | POA: Diagnosis not present

## 2021-08-09 DIAGNOSIS — Z79899 Other long term (current) drug therapy: Secondary | ICD-10-CM | POA: Insufficient documentation

## 2021-08-09 DIAGNOSIS — Z5112 Encounter for antineoplastic immunotherapy: Secondary | ICD-10-CM | POA: Insufficient documentation

## 2021-08-09 DIAGNOSIS — C7931 Secondary malignant neoplasm of brain: Secondary | ICD-10-CM | POA: Insufficient documentation

## 2021-08-09 DIAGNOSIS — H409 Unspecified glaucoma: Secondary | ICD-10-CM | POA: Insufficient documentation

## 2021-08-09 DIAGNOSIS — E032 Hypothyroidism due to medicaments and other exogenous substances: Secondary | ICD-10-CM

## 2021-08-09 DIAGNOSIS — C3411 Malignant neoplasm of upper lobe, right bronchus or lung: Secondary | ICD-10-CM | POA: Diagnosis not present

## 2021-08-09 DIAGNOSIS — Z5111 Encounter for antineoplastic chemotherapy: Secondary | ICD-10-CM | POA: Insufficient documentation

## 2021-08-09 DIAGNOSIS — D6481 Anemia due to antineoplastic chemotherapy: Secondary | ICD-10-CM | POA: Insufficient documentation

## 2021-08-09 LAB — CMP (CANCER CENTER ONLY)
ALT: 29 U/L (ref 0–44)
AST: 13 U/L — ABNORMAL LOW (ref 15–41)
Albumin: 3.7 g/dL (ref 3.5–5.0)
Alkaline Phosphatase: 74 U/L (ref 38–126)
Anion gap: 11 (ref 5–15)
BUN: 28 mg/dL — ABNORMAL HIGH (ref 8–23)
CO2: 26 mmol/L (ref 22–32)
Calcium: 9 mg/dL (ref 8.9–10.3)
Chloride: 99 mmol/L (ref 98–111)
Creatinine: 0.98 mg/dL (ref 0.61–1.24)
GFR, Estimated: >60 mL/min (ref >60)
Glucose, Bld: 427 mg/dL — ABNORMAL HIGH (ref 70–99)
Potassium: 4.5 mmol/L (ref 3.5–5.1)
Sodium: 136 mmol/L (ref 135–145)
Total Bilirubin: 0.5 mg/dL (ref 0.3–1.2)
Total Protein: 6.4 g/dL — ABNORMAL LOW (ref 6.5–8.1)

## 2021-08-09 LAB — CBC WITH DIFFERENTIAL (CANCER CENTER ONLY)
Abs Immature Granulocytes: 0.05 10*3/uL (ref 0.00–0.07)
Basophils Absolute: 0 10*3/uL (ref 0.0–0.1)
Basophils Relative: 0 %
Eosinophils Absolute: 0 10*3/uL (ref 0.0–0.5)
Eosinophils Relative: 0 %
HCT: 36.4 % — ABNORMAL LOW (ref 39.0–52.0)
Hemoglobin: 11.7 g/dL — ABNORMAL LOW (ref 13.0–17.0)
Immature Granulocytes: 1 %
Lymphocytes Relative: 7 %
Lymphs Abs: 0.5 10*3/uL — ABNORMAL LOW (ref 0.7–4.0)
MCH: 32.4 pg (ref 26.0–34.0)
MCHC: 32.1 g/dL (ref 30.0–36.0)
MCV: 100.8 fL — ABNORMAL HIGH (ref 80.0–100.0)
Monocytes Absolute: 0.5 10*3/uL (ref 0.1–1.0)
Monocytes Relative: 8 %
Neutro Abs: 5.6 10*3/uL (ref 1.7–7.7)
Neutrophils Relative %: 84 %
Platelet Count: 212 10*3/uL (ref 150–400)
RBC: 3.61 MIL/uL — ABNORMAL LOW (ref 4.22–5.81)
RDW: 14.2 % (ref 11.5–15.5)
WBC Count: 6.6 10*3/uL (ref 4.0–10.5)
nRBC: 1.2 % — ABNORMAL HIGH (ref 0.0–0.2)

## 2021-08-09 LAB — TSH: TSH: 1.478 u[IU]/mL (ref 0.320–4.118)

## 2021-08-09 MED ORDER — SODIUM CHLORIDE 0.9% FLUSH
10.0000 mL | INTRAVENOUS | Status: DC | PRN
  Administered 2021-08-09: 10 mL

## 2021-08-09 MED ORDER — SODIUM CHLORIDE 0.9 % IV SOLN
200.0000 mg | Freq: Once | INTRAVENOUS | Status: AC
  Administered 2021-08-09: 200 mg via INTRAVENOUS
  Filled 2021-08-09: qty 200

## 2021-08-09 MED ORDER — PROCHLORPERAZINE MALEATE 10 MG PO TABS
10.0000 mg | ORAL_TABLET | Freq: Once | ORAL | Status: AC
  Administered 2021-08-09: 10 mg via ORAL
  Filled 2021-08-09: qty 1

## 2021-08-09 MED ORDER — HEPARIN SOD (PORK) LOCK FLUSH 100 UNIT/ML IV SOLN
500.0000 [IU] | Freq: Once | INTRAVENOUS | Status: AC | PRN
  Administered 2021-08-09: 500 [IU]

## 2021-08-09 MED ORDER — SODIUM CHLORIDE 0.9 % IV SOLN
500.0000 mg/m2 | Freq: Once | INTRAVENOUS | Status: AC
  Administered 2021-08-09: 900 mg via INTRAVENOUS
  Filled 2021-08-09: qty 20

## 2021-08-09 MED ORDER — SODIUM CHLORIDE 0.9% FLUSH
10.0000 mL | Freq: Once | INTRAVENOUS | Status: AC
  Administered 2021-08-09: 10 mL

## 2021-08-09 MED ORDER — SODIUM CHLORIDE 0.9 % IV SOLN: Freq: Once | INTRAVENOUS | Status: AC

## 2021-08-09 NOTE — Patient Instructions (Signed)
[facility]  Discharge Instructions: Thank you for choosing [facility] to provide your oncology and hematology care.   If you have a lab appointment with the Cancer Center, please go directly to the Cancer Center and check in at the registration area.   Wear comfortable clothing and clothing appropriate for easy access to any Portacath or PICC line.   We strive to give you quality time with your provider. You may need to reschedule your appointment if you arrive late (15 or more minutes).  Arriving late affects you and other patients whose appointments are after yours.  Also, if you miss three or more appointments without notifying the office, you may be dismissed from the clinic at the provider's discretion.      For prescription refill requests, have your pharmacy contact our office and allow 72 hours for refills to be completed.    Today you received the following chemotherapy and/or immunotherapy agents: Keytruda/Alimta.      To help prevent nausea and vomiting after your treatment, we encourage you to take your nausea medication as directed.  BELOW ARE SYMPTOMS THAT SHOULD BE REPORTED IMMEDIATELY: *FEVER GREATER THAN 100.4 F (38 C) OR HIGHER *CHILLS OR SWEATING *NAUSEA AND VOMITING THAT IS NOT CONTROLLED WITH YOUR NAUSEA MEDICATION *UNUSUAL SHORTNESS OF BREATH *UNUSUAL BRUISING OR BLEEDING *URINARY PROBLEMS (pain or burning when urinating, or frequent urination) *BOWEL PROBLEMS (unusual diarrhea, constipation, pain near the anus) TENDERNESS IN MOUTH AND THROAT WITH OR WITHOUT PRESENCE OF ULCERS (sore throat, sores in mouth, or a toothache) UNUSUAL RASH, SWELLING OR PAIN  UNUSUAL VAGINAL DISCHARGE OR ITCHING   Items with * indicate a potential emergency and should be followed up as soon as possible or go to the Emergency Department if any problems should occur.  Please show the CHEMOTHERAPY ALERT CARD or IMMUNOTHERAPY ALERT CARD at  check-in to the Emergency Department and triage nurse.  Should you have questions after your visit or need to cancel or reschedule your appointment, please contact [facility]  Dept: [phone]  and follow the prompts.  Office hours are 8:00 a.m. to 4:30 p.m. Monday - Friday. Please note that voicemails left after 4:00 p.m. may not be returned until the following business day.  We are closed weekends and major holidays. You have access to a nurse at all times for urgent questions. Please call the main number to the clinic Dept: [phone] and follow the prompts.   For any non-urgent questions, you may also contact your provider using MyChart. We now offer e-Visits for anyone 18 and older to request care online for non-urgent symptoms. For details visit [email or web address].   Also download the MyChart app! Go to the app store, search "MyChart", open the app, select , and log in with your MyChart username and password.  Due to Covid, a mask is required upon entering the hospital/clinic. If you do not have a mask, one will be given to you upon arrival. For doctor visits, patients may have 1 support person aged 18 or older with them. For treatment visits, patients cannot have anyone with them due to current Covid guidelines and our immunocompromised population.   

## 2021-08-09 NOTE — Progress Notes (Signed)
?[place] ?Telephone:([phone]) [phone]   [phone] ? ?PROGRESS NOTE ? ?Patient Care Team: ?Horald Pollen, MD as PCP - General (Internal Medicine) ?Joretta Bachelor, PA as Physician Assistant (Physician Assistant) ? ?Hematological/Oncological History ?# Stage IA2 (cT1b, N0) non-small cell lung cancer (adenocarcinoma) ?-- SBRT to the right lung on 02/21/2019 through 02/28/2019 followed by radiation to additional lung nodules  ? 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  This was followed by an MRI of the brain with and without contrast which showed 4 brain lesions consistent with metastases, extensive vasogenic edema associated with the posterior right frontal lesion without midline shift. ?6/15-6/28/2022: 10 fractions of WBRT with Rad/Onc ?11/23/2020: Guardant 360 returns with no targetable mutation.  ?12/14/2020: Cycle 1 Day 1 of Carbo/Pem/Pem ?01/04/2021: Cycle 2 Day 1 of Carbo/Pem/Pem ?01/25/2021: Cycle 3 Day 1 of Carbo/Pem/Pem ?02/15/2021: Cycle 4 Day 1 of Carbo/Pem/Pem ?03/08/2021: Cycle 5 Day 1 of maintenance Pem/Pem ?03/29/2021: Cycle 6 Day 1 of maintenance Pem/Pem ?04/19/2021: Cycle 7 Day 1 of maintenance Pem/Pem ?05/16/2021: Cycle 8 Day 1 of maintenance Pem/Pem ?06/07/2021: Cycle 9 Day 1 of maintenance Pem/Pem ?06/28/2021: Cycle 10 Day 1 of maintenance Pem/Pem ?07/19/2021: Cycle 11 Day 1 of maintenance Pem/Pem (delayed 2/2 hospitalization due to worsening brain metastases) ? ? ?Interval History:  ?Benjamin Willis. 76 y.o. 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 The patient's last visit was on 07/19/2021.  In the interim since the last visit he has completed Cycle 11 of maintenance chemotherapy. He presents today  for Cycle 12 Day 1.  ? ?On exam today, Benjamin Willis reports he has been well overall in the interim since her last visit.  He notes that he does occasionally have some discomfort in his left leg but overall his mobility is markedly improved.  He has been discharged home from his rehab facility.  He reports he is eating well and he is tolerating chemotherapy without any difficulty.  He notes that the last cycle "went good".  He continues to steroid therapy every 12 hours.  His energy levels are strong and overall he feels like his strength is improving.  He has no questions concerns or complaints today.  He currently denies any issues with fevers, chills, night sweats, shortness of breath, chest pain or cough.   A full 10 point ROS is listed below. ? ?MEDICAL HISTORY:  ?Past Medical History:  ?Diagnosis Date  ? Allergy   ? Diabetes mellitus without complication ([place])   ? Type II  ? Glaucoma 10/16/2020  ? History of radiation therapy   ? Thorax- SBRT 02/21/19-02/28/19, Rt Lung- IMRT 02/29/20-04/16/20 Dr. Gery Pray  ? History of radiation therapy 10/30/2020  ? whole brain 10/17/2020-10/30/2020 Dr Gery Pray  ? Hyperlipidemia   ? Hypertension   ? Lung cancer ([place])   ? Stage IA2 (cT1b, N0) non-small cell lung cancer (adenocarcinoma)  ? ? ?SURGICAL HISTORY: ?Past Surgical History:  ?Procedure Laterality Date  ? ELECTROMAGNETIC NAVIGATION BROCHOSCOPY Right 02/02/2019  ? Procedure: VIDEO BRONCHOSCOPY WITH NAVIGATION AND FIDUCIAL PLACEMENT;  Surgeon: Garner Nash, DO;  Location: [facility];  Service: Cardiopulmonary;  Laterality: Right;  ? FUDUCIAL PLACEMENT Right 02/02/2019  ? Procedure: Placement Of Fuducial Right Upper Lobe;  Surgeon: Garner Nash, DO;  Location: [facility]  OR;  Service: Cardiopulmonary;  Laterality: Right;  ? IR IMAGING GUIDED PORT INSERTION  12/13/2020  ? none    ? VIDEO BRONCHOSCOPY WITH RADIAL ENDOBRONCHIAL ULTRASOUND Right 02/02/2019  ? Procedure: Video Bronchoscopy With Radial Endobronchial Ultrasound;   Surgeon: Garner Nash, DO;  Location: [facility];  Service: Cardiopulmonary;  Laterality: Right;  ? ? ?SOCIAL HISTORY: ?Social History  ? ?Socioeconomic History  ? Marital status: Single  ?  Spouse name: Not on file  ? Number of children: 0  ? Years of education: Not on file  ? Highest education level: 9th grade  ?Occupational History  ? Not on file  ?Tobacco Use  ? Smoking status: Former  ?  Packs/day: 1.00  ?  Years: 48.00  ?  Pack years: 48.00  ?  Types: Cigarettes  ?  Quit date: 02/20/2015  ?  Years since quitting: 6.4  ? Smokeless tobacco: Former  ?  Quit date: 10/2013  ?Vaping Use  ? Vaping Use: Never used  ?Substance and Sexual Activity  ? Alcohol use: No  ? Drug use: No  ? Sexual activity: Yes  ?  Birth control/protection: Condom  ?Other Topics Concern  ? Not on file  ?Social History Narrative  ? Not on file  ? ?Social Determinants of Health  ? ?Financial Resource Strain: Not on file  ?Food Insecurity: Not on file  ?Transportation Needs: Not on file  ?Physical Activity: Not on file  ?Stress: Not on file  ?Social Connections: Not on file  ?Intimate Partner Violence: Not on file  ? ? ?FAMILY HISTORY: ?Family History  ?Problem Relation Age of Onset  ? Lung cancer Neg Hx   ? ? ?ALLERGIES:  has No Known Allergies. ? ?MEDICATIONS:  ?Current Outpatient Medications  ?Medication Sig Dispense Refill  ? albuterol (VENTOLIN HFA) 108 (90 Base) MCG/ACT inhaler Inhale 2 puffs into the lungs every 6 (six) hours as needed for wheezing or shortness of breath. 18 g 6  ? amLODipine (NORVASC) 5 MG tablet Take 1 tablet (5 mg total) by mouth daily. 30 tablet 0  ? blood glucose meter kit and supplies KIT Dispense based on patient and insurance preference. Use up to four times daily as directed. 1 each 0  ? Blood Pressure Monitoring (BLOOD PRESSURE MONITOR/ARM) DEVI Check BP once a day. DX I 10 1 each 0  ? dexamethasone (DECADRON) 4 MG tablet Take 1 tablet (4 mg total) by mouth every 12 (twelve) hours. 60 tablet 1  ?  diphenhydrAMINE-zinc acetate (BENADRYL) cream Apply topically 2 (two) times daily as needed for itching. 21.9 g 0  ? folic acid (FOLVITE) 1 MG tablet Take 1 tablet (1 mg total) by mouth daily. 90 tablet 1  ? gabapentin (NEURONTIN) 100 MG capsule Take 1 capsule (100 mg total) by mouth 2 (two) times daily. 60 capsule 0  ? insulin aspart (NOVOLOG) 100 UNIT/ML injection Inject 4 Units into the skin 3 (three) times daily with meals. 10 mL 11  ? insulin glargine-yfgn (SEMGLEE) 100 UNIT/ML injection Inject 0.16 mLs (16 Units total) into the skin daily. 10 mL 11  ? insulin glargine-yfgn (SEMGLEE) 100 UNIT/ML injection Inject 0.13 mLs (13 Units total) into the skin at bedtime. 10 mL 11  ? Insulin Lispro (HUMALOG IJ) Inject as directed. Per sliding scale    ? latanoprost (XALATAN) 0.005 % ophthalmic solution Place 1 drop into both eyes at bedtime. 2.5 mL 3  ? levETIRAcetam (KEPPRA) 1000 MG tablet Take 1 tablet by mouth twice daily (  Patient taking differently: Take 1,000 mg by mouth in the morning and at bedtime.) 180 tablet 1  ? metFORMIN (GLUCOPHAGE) 500 MG tablet TAKE 1 TABLET BY MOUTH TWICE DAILY WITH MEALS (Patient taking differently: Take 500 mg by mouth 2 (two) times daily with a meal.) 180 tablet 0  ? multivitamin (ONE-A-DAY MEN'S) TABS tablet Take 1 tablet by mouth daily with breakfast.    ? pantoprazole (PROTONIX) 40 MG tablet Take 1 tablet (40 mg total) by mouth daily. (Patient taking differently: Take 40 mg by mouth daily before breakfast.) 90 tablet 1  ? rosuvastatin (CRESTOR) 20 MG tablet Take 20 mg by mouth daily.    ? ?No current facility-administered medications for this visit.  ? ?Facility-Administered Medications Ordered in Other Visits  ?Medication Dose Route Frequency Provider Last Rate Last Admin  ? sodium chloride flush (NS) 0.9 % injection 10 mL  10 mL Intracatheter PRN Orson Slick, MD   10 mL at 08/09/21 1623  ? ? ?REVIEW OF SYSTEMS:   ?Constitutional: ( - ) fevers, ( - )  chills , ( - ) night  sweats ?Eyes: ( - ) blurriness of vision, ( - ) double vision, ( - ) watery eyes ?Ears, nose, mouth, throat, and face: ( - ) mucositis, ( - ) sore throat ?Respiratory: ( - ) cough, ( - ) dyspnea, ( - ) wheezes ?Cardiova

## 2021-08-11 ENCOUNTER — Other Ambulatory Visit: Payer: Self-pay

## 2021-08-11 ENCOUNTER — Emergency Department (HOSPITAL_COMMUNITY)
Admission: EM | Admit: 2021-08-11 | Discharge: 2021-08-11 | Disposition: A | Discharge status: Home / Self Care | Payer: Medicare HMO | Attending: Emergency Medicine | Admitting: Emergency Medicine

## 2021-08-11 ENCOUNTER — Ambulatory Visit
Admission: EM | Admit: 2021-08-11 | Discharge: 2021-08-11 | Disposition: A | Discharge status: Home / Self Care | Payer: Medicare HMO

## 2021-08-11 ENCOUNTER — Encounter (HOSPITAL_COMMUNITY): Payer: Self-pay

## 2021-08-11 DIAGNOSIS — Z794 Long term (current) use of insulin: Secondary | ICD-10-CM | POA: Diagnosis not present

## 2021-08-11 DIAGNOSIS — Z85118 Personal history of other malignant neoplasm of bronchus and lung: Secondary | ICD-10-CM | POA: Diagnosis not present

## 2021-08-11 DIAGNOSIS — Z79899 Other long term (current) drug therapy: Secondary | ICD-10-CM | POA: Insufficient documentation

## 2021-08-11 DIAGNOSIS — Z85841 Personal history of malignant neoplasm of brain: Secondary | ICD-10-CM | POA: Diagnosis not present

## 2021-08-11 DIAGNOSIS — D649 Anemia, unspecified: Secondary | ICD-10-CM | POA: Insufficient documentation

## 2021-08-11 DIAGNOSIS — R739 Hyperglycemia, unspecified: Secondary | ICD-10-CM

## 2021-08-11 DIAGNOSIS — E1165 Type 2 diabetes mellitus with hyperglycemia: Secondary | ICD-10-CM | POA: Insufficient documentation

## 2021-08-11 DIAGNOSIS — I10 Essential (primary) hypertension: Secondary | ICD-10-CM | POA: Diagnosis not present

## 2021-08-11 DIAGNOSIS — E119 Type 2 diabetes mellitus without complications: Secondary | ICD-10-CM

## 2021-08-11 DIAGNOSIS — Z7984 Long term (current) use of oral hypoglycemic drugs: Secondary | ICD-10-CM | POA: Diagnosis not present

## 2021-08-11 LAB — BASIC METABOLIC PANEL
Anion gap: 10 (ref 5–15)
BUN: 24 mg/dL — ABNORMAL HIGH (ref 8–23)
CO2: 27 mmol/L (ref 22–32)
Calcium: 8.8 mg/dL — ABNORMAL LOW (ref 8.9–10.3)
Chloride: 100 mmol/L (ref 98–111)
Creatinine, Ser: 0.96 mg/dL (ref 0.61–1.24)
GFR, Estimated: >60 mL/min (ref >60)
Glucose, Bld: 409 mg/dL — ABNORMAL HIGH (ref 70–99)
Potassium: 4.3 mmol/L (ref 3.5–5.1)
Sodium: 137 mmol/L (ref 135–145)

## 2021-08-11 LAB — CBC WITH DIFFERENTIAL/PLATELET
Abs Immature Granulocytes: 0.03 10*3/uL (ref 0.00–0.07)
Basophils Absolute: 0 10*3/uL (ref 0.0–0.1)
Basophils Relative: 0 %
Eosinophils Absolute: 0 10*3/uL (ref 0.0–0.5)
Eosinophils Relative: 0 %
HCT: 38.4 % — ABNORMAL LOW (ref 39.0–52.0)
Hemoglobin: 12.4 g/dL — ABNORMAL LOW (ref 13.0–17.0)
Immature Granulocytes: 1 %
Lymphocytes Relative: 17 %
Lymphs Abs: 0.9 10*3/uL (ref 0.7–4.0)
MCH: 32.7 pg (ref 26.0–34.0)
MCHC: 32.3 g/dL (ref 30.0–36.0)
MCV: 101.3 fL — ABNORMAL HIGH (ref 80.0–100.0)
Monocytes Absolute: 0.3 10*3/uL (ref 0.1–1.0)
Monocytes Relative: 7 %
Neutro Abs: 4 10*3/uL (ref 1.7–7.7)
Neutrophils Relative %: 75 %
Platelets: 199 10*3/uL (ref 150–400)
RBC: 3.79 MIL/uL — ABNORMAL LOW (ref 4.22–5.81)
RDW: 14.2 % (ref 11.5–15.5)
WBC: 5.2 10*3/uL (ref 4.0–10.5)
nRBC: 0.6 % — ABNORMAL HIGH (ref 0.0–0.2)

## 2021-08-11 LAB — CBG MONITORING, ED
Glucose-Capillary: 410 mg/dL — ABNORMAL HIGH (ref 70–99)
Glucose-Capillary: 376 mg/dL — ABNORMAL HIGH (ref 70–99)
Glucose-Capillary: 277 mg/dL — ABNORMAL HIGH (ref 70–99)

## 2021-08-11 MED ORDER — INSULIN STARTER KIT- SYRINGES (ENGLISH): 1.0000 | Freq: Once | 0 refills | Status: AC

## 2021-08-11 MED ORDER — SODIUM CHLORIDE 0.9 % IV SOLN: INTRAVENOUS | Status: DC

## 2021-08-11 MED ORDER — SODIUM CHLORIDE 0.9 % IV BOLUS
500.0000 mL | Freq: Once | INTRAVENOUS | Status: AC
Start: 2021-08-11 — End: 2021-08-11
  Administered 2021-08-11: 500 mL via INTRAVENOUS

## 2021-08-11 MED ORDER — INSULIN ASPART 100 UNIT/ML IJ SOLN
5.0000 [IU] | Freq: Once | INTRAMUSCULAR | Status: AC
  Administered 2021-08-11: 5 [IU] via INTRAVENOUS
  Filled 2021-08-11: qty 0.05

## 2021-08-11 MED ORDER — INSULIN LISPRO 100 UNIT/ML IJ SOLN: INTRAMUSCULAR | 3 refills | Status: DC

## 2021-08-11 NOTE — ED Triage Notes (Signed)
Pt arrived via POV, c/o hyperglycemia, states 573 this morning. Denies any n/v or diarrhea. Has been taking metformin as normal, states was recently d/c from [place] and was told would be prescribed insulin, never prescribed.  ?

## 2021-08-11 NOTE — ED Provider Notes (Addendum)
[facility]-EMERGENCY DEPT Provider Note   CSN: [id number] Arrival date & time: 08/11/21  1308     History  Chief Complaint  Patient presents with   Hyperglycemia    Benjamin Willis. is a 76 y.o. male.  Patient with high blood sugar this morning it was about 500.  Family member that is the caregiver concerned about this being too high.  Patient has a known history of type 2 diabetes.  Is taking his metformin.  Has a listing of medicines on being on insulin but according to caregiver patient's never been on insulin.  Patient did deviate from his diet it sounds like last night and had a lot of carbohydrates.  This could explain the elevated blood sugar he has not been sick or ill in any other way.  Patient's blood sugars earlier in the week were in the low 100s.  Patient is on dexamethasone on a regular basis nothing new because of metastatic lung cancer to the brain.  Patient is followed by hematology oncology.  And was seen by them on April 7.  Primary care doctor is Dr. Alvy Bimler.  She has not had any fevers.  Does not feel ill.  Did have his metformin this morning.  Patient has increased urination.  But no pain with urination.  Patient known to have metastatic lung cancer.  Followed closely by hematology oncology.  Seen by them on April 7.  Past medical history also significant for hypertension hyperlipidemia type 2 diabetes history of radiation treatment and history of chemotherapy.      Home Medications Prior to Admission medications   Medication Sig Start Date End Date Taking? Authorizing Provider  albuterol (VENTOLIN HFA) 108 (90 Base) MCG/ACT inhaler Inhale 2 puffs into the lungs every 6 (six) hours as needed for wheezing or shortness of breath. 01/19/19   Icard, Rachel Bo, DO  amLODipine (NORVASC) 5 MG tablet Take 1 tablet (5 mg total) by mouth daily. 07/16/21   Regalado, Belkys A, MD  blood glucose meter kit and supplies KIT Dispense based on patient and  insurance preference. Use up to four times daily as directed. 10/23/20   Zigmund Daniel., MD  Blood Pressure Monitoring (BLOOD PRESSURE MONITOR/ARM) DEVI Check BP once a day. DX I 10 04/18/19   Sagardia, Eilleen Kempf, MD  dexamethasone (DECADRON) 4 MG tablet Take 1 tablet (4 mg total) by mouth every 12 (twelve) hours. 07/15/21   Regalado, Belkys A, MD  diphenhydrAMINE-zinc acetate (BENADRYL) cream Apply topically 2 (two) times daily as needed for itching. 07/15/21   Regalado, Belkys A, MD  folic acid (FOLVITE) 1 MG tablet Take 1 tablet (1 mg total) by mouth daily. 05/01/21   Georgina Quint, MD  gabapentin (NEURONTIN) 100 MG capsule Take 1 capsule (100 mg total) by mouth 2 (two) times daily. 07/15/21   Regalado, Belkys A, MD  insulin aspart (NOVOLOG) 100 UNIT/ML injection Inject 4 Units into the skin 3 (three) times daily with meals. 07/15/21   Regalado, Belkys A, MD  insulin glargine-yfgn (SEMGLEE) 100 UNIT/ML injection Inject 0.16 mLs (16 Units total) into the skin daily. 07/16/21   Regalado, Belkys A, MD  insulin glargine-yfgn (SEMGLEE) 100 UNIT/ML injection Inject 0.13 mLs (13 Units total) into the skin at bedtime. 07/15/21   Regalado, Belkys A, MD  insulin lispro (HUMALOG) 100 UNIT/ML injection Per sliding scale: For blood sugars 201-250 give 2 units; 251-300 give 4 units; 301-350 give 6 units, 351-400 give 8 units; greater than  400 give 10 units; subcutaneously before meals and at bedtime related to type 2 diabetes. 08/11/21   Vanetta Mulders, MD  latanoprost (XALATAN) 0.005 % ophthalmic solution Place 1 drop into both eyes at bedtime. 01/24/21   Elenore Paddy, NP  levETIRAcetam (KEPPRA) 1000 MG tablet Take 1 tablet by mouth twice daily Patient taking differently: Take 1,000 mg by mouth in the morning and at bedtime. 05/29/21   Georgina Quint, MD  metFORMIN (GLUCOPHAGE) 500 MG tablet TAKE 1 TABLET BY MOUTH TWICE DAILY WITH MEALS Patient taking differently: Take 500 mg by mouth 2 (two)  times daily with a meal. 06/25/21   Sagardia, Eilleen Kempf, MD  multivitamin (ONE-A-DAY MEN'S) TABS tablet Take 1 tablet by mouth daily with breakfast.    [provider]  pantoprazole (PROTONIX) 40 MG tablet Take 1 tablet (40 mg total) by mouth daily. Patient taking differently: Take 40 mg by mouth daily before breakfast. 05/01/21   Georgina Quint, MD  rosuvastatin (CRESTOR) 20 MG tablet Take 20 mg by mouth daily.    [provider]      Allergies    Patient has no known allergies.    Review of Systems   Review of Systems  Constitutional:  Negative for chills and fever.  HENT:  Negative for rhinorrhea and sore throat.   Eyes:  Negative for visual disturbance.  Respiratory:  Negative for cough and shortness of breath.   Cardiovascular:  Negative for chest pain and leg swelling.  Gastrointestinal:  Negative for abdominal pain, diarrhea, nausea and vomiting.  Genitourinary:  Negative for dysuria.  Musculoskeletal:  Negative for back pain and neck pain.  Skin:  Negative for rash.  Neurological:  Negative for dizziness, light-headedness and headaches.  Hematological:  Does not bruise/bleed easily.  Psychiatric/Behavioral:  Negative for confusion.    Physical Exam Updated Vital Signs BP 137/86 (BP Location: Left Arm)   Pulse 90   Temp 98.1 F (36.7 C) (Oral)   Resp 16   SpO2 99%  Physical Exam Vitals and nursing note reviewed.  Constitutional:      General: He is not in acute distress.    Appearance: Normal appearance. He is well-developed.  HENT:     Head: Normocephalic and atraumatic.  Eyes:     Extraocular Movements: Extraocular movements intact.     Conjunctiva/sclera: Conjunctivae normal.     Pupils: Pupils are equal, round, and reactive to light.  Cardiovascular:     Rate and Rhythm: Normal rate and regular rhythm.     Heart sounds: No murmur heard. Pulmonary:     Effort: Pulmonary effort is normal. No respiratory distress.     Breath sounds:  Normal breath sounds.  Abdominal:     Palpations: Abdomen is soft.     Tenderness: There is no abdominal tenderness.  Musculoskeletal:        General: No swelling.     Cervical back: Normal range of motion and neck supple.  Skin:    General: Skin is warm and dry.     Capillary Refill: Capillary refill takes less than 2 seconds.  Neurological:     General: No focal deficit present.     Mental Status: He is alert. Mental status is at baseline.  Psychiatric:        Mood and Affect: Mood normal.    ED Results / Procedures / Treatments   Labs (all labs ordered are listed, but only abnormal results are displayed) Labs Reviewed  CBC WITH  DIFFERENTIAL/PLATELET - Abnormal; Notable for the following components:      Result Value   RBC 3.79 (*)    Hemoglobin 12.4 (*)    HCT 38.4 (*)    MCV 101.3 (*)    nRBC 0.6 (*)    All other components within normal limits  BASIC METABOLIC PANEL - Abnormal; Notable for the following components:   Glucose, Bld 409 (*)    BUN 24 (*)    Calcium 8.8 (*)    All other components within normal limits  CBG MONITORING, ED - Abnormal; Notable for the following components:   Glucose-Capillary 410 (*)    All other components within normal limits  CBG MONITORING, ED - Abnormal; Notable for the following components:   Glucose-Capillary 376 (*)    All other components within normal limits  POC OCCULT BLOOD, ED    EKG None  Radiology No results found.  Procedures Procedures    Medications Ordered in ED Medications  0.9 %  sodium chloride infusion ( Intravenous New Bag/Given 08/11/21 1437)  sodium chloride 0.9 % bolus 500 mL (500 mLs Intravenous New Bag/Given 08/11/21 1400)  insulin aspart (novoLOG) injection 5 Units (5 Units Intravenous Given 08/11/21 1435)    ED Course/ Medical Decision Making/ A&P                           Medical Decision Making Amount and/or Complexity of Data Reviewed Labs: ordered.  Risk Prescription drug  management.   CBC very reassuring no leukocytosis.  Hemoglobin 12.4.  Platelets 199 basic metabolic panel had a blood sugar of 409 here CO2 is 27 electrolytes are normal GFR is greater than 60 blood sugar upon arrival here was four 10 repeat 376 but then based on the labs we got a blood sugar of 409.  But no evidence of any acidosis.  Patient's sugars could have been high this morning because of deviation from the diet.  They do have a home health nurse that comes out once a week.  She did make note that his medication list includes an insulin sliding scale as well as long-acting and short acting insulin.  But according to family member that he has never been getting that.  Does have primary care doctor that could help with just.  We will give 5 units of regular insulin here.  I did give her a prescription for sliding scale.  But there may be some difficulties in training and using syringes and the needles.  Patient's caregiver used to take care of her of her husband's diabetes but its been a long time since she use the regular insulin syringes.  He is now on an insulin pen.  We will see if we get some resources to maybe retrain her on how to do the insulin syringes.  If not we may just of this follow-up with primary care doctor since there is no complications in the short run here from the diabetes.   Patient's blood sugar down to 277 he did receive a little bit of regular insulin.  We do not have a diabetic resource nurse here today with it being Easter Sunday.  Patient given prescription for sliding scale and given a starter kit for syringes.  We will have him follow-up with primary care doctor as well as home health nurse that has been coming out.  Would recommend that the patient follow diet very carefully.  And continue his metformin and other medications.  Final Clinical Impression(s) / ED Diagnoses Final diagnoses:  Hyperglycemia  Type 2 diabetes mellitus without complication, unspecified  whether long term insulin use (HCC)    Rx / DC Orders ED Discharge Orders          Ordered    insulin lispro (HUMALOG) 100 UNIT/ML injection        08/11/21 1402              Vanetta Mulders, MD 08/11/21 1445    Vanetta Mulders, MD 08/11/21 1600    Vanetta Mulders, MD 08/11/21 1601

## 2021-08-11 NOTE — Discharge Instructions (Addendum)
Sugar improved here.  No complicating factors.  Prescription provided for sliding scale if needed.  Prescription provided for starter kit of insulin syringes.  Contact primary care doctor tomorrow for additional guidance and information also home nurse will be able to help as well. ?

## 2021-08-15 ENCOUNTER — Other Ambulatory Visit: Payer: Self-pay | Admitting: *Deleted

## 2021-08-15 ENCOUNTER — Telehealth: Payer: Self-pay | Admitting: *Deleted

## 2021-08-15 DIAGNOSIS — C3490 Malignant neoplasm of unspecified part of unspecified bronchus or lung: Secondary | ICD-10-CM

## 2021-08-15 NOTE — Telephone Encounter (Signed)
TCT pt's sister regarding his Brain MRI. Pt was given a scheduling notice last week to give to her about scheduling his MRI. ?Spoke with her. She states she did receive that and will call to schedule his MRI today. She states she has been very busy taking care of pt and her husband. ?I told her I would check later this afternoon to see when it is scheduled. ?

## 2021-08-15 NOTE — Progress Notes (Signed)
Patients sister called to report that [facility] unable to schedule MRI as patient is unable to get onto the MRI table without assistance.  Placed new order for [facility]. ? ? ?

## 2021-08-19 ENCOUNTER — Inpatient Hospital Stay: Payer: Medicare HMO

## 2021-08-20 ENCOUNTER — Encounter: Payer: Self-pay | Admitting: Emergency Medicine

## 2021-08-20 ENCOUNTER — Ambulatory Visit (INDEPENDENT_AMBULATORY_CARE_PROVIDER_SITE_OTHER): Payer: Medicare HMO | Admitting: Emergency Medicine

## 2021-08-20 ENCOUNTER — Telehealth: Payer: Self-pay | Admitting: Internal Medicine

## 2021-08-20 VITALS — BP 124/68 | HR 83 | Temp 98.2°F | Ht 66.0 in | Wt 150.2 lb

## 2021-08-20 DIAGNOSIS — E1169 Type 2 diabetes mellitus with other specified complication: Secondary | ICD-10-CM | POA: Diagnosis not present

## 2021-08-20 DIAGNOSIS — R531 Weakness: Secondary | ICD-10-CM | POA: Insufficient documentation

## 2021-08-20 DIAGNOSIS — I152 Hypertension secondary to endocrine disorders: Secondary | ICD-10-CM

## 2021-08-20 DIAGNOSIS — E785 Hyperlipidemia, unspecified: Secondary | ICD-10-CM

## 2021-08-20 DIAGNOSIS — E1165 Type 2 diabetes mellitus with hyperglycemia: Secondary | ICD-10-CM | POA: Insufficient documentation

## 2021-08-20 DIAGNOSIS — E1159 Type 2 diabetes mellitus with other circulatory complications: Secondary | ICD-10-CM

## 2021-08-20 DIAGNOSIS — Z9181 History of falling: Secondary | ICD-10-CM | POA: Diagnosis not present

## 2021-08-20 DIAGNOSIS — C3490 Malignant neoplasm of unspecified part of unspecified bronchus or lung: Secondary | ICD-10-CM

## 2021-08-20 LAB — POCT GLYCOSYLATED HEMOGLOBIN (HGB A1C): Hemoglobin A1C: 10.5 % — AB (ref 4.0–5.6)

## 2021-08-20 MED ORDER — TIRZEPATIDE 5 MG/0.5ML ~~LOC~~ SOAJ: 5.0000 mg | SUBCUTANEOUS | 5 refills | Status: DC

## 2021-08-20 MED ORDER — AMLODIPINE BESYLATE 5 MG PO TABS: 5.0000 mg | ORAL_TABLET | Freq: Every day | ORAL | 1 refills | Status: DC

## 2021-08-20 MED ORDER — EMPAGLIFLOZIN 10 MG PO TABS: 10.0000 mg | ORAL_TABLET | Freq: Every day | ORAL | 1 refills | Status: DC

## 2021-08-20 NOTE — Progress Notes (Signed)
Djimon Florina Ou. ?76 y.o. ? ? ?Chief Complaint  ?Patient presents with  ? Hospitalization Follow-up  ? Weakness  ?  Difficulty standing  ? Urinary Frequency  ? ? ?HISTORY OF PRESENT ILLNESS: ?This is a 76 y.o. male here for follow-up of emergency department visit on 08/11/2021 when he was seen for uncontrolled diabetes.  Presently on metformin.  Was started on insulin in the emergency department. ?Complaining of general weakness and urinary frequency. ?Patient has history of metastatic lung cancer. ?Emergency department assessment and plan notes as follows: ?ED Course/ Medical Decision Making/ A&P ?                        ?Medical Decision Making ?Amount and/or Complexity of Data Reviewed ?Labs: ordered. ?  ?Risk ?Prescription drug management. ?  ?  ?CBC very reassuring no leukocytosis.  Hemoglobin 12.4.  Platelets 914 basic metabolic panel had a blood sugar of 409 here CO2 is 27 electrolytes are normal GFR is greater than 60 blood sugar upon arrival here was four 10 repeat 376 but then based on the labs we got a blood sugar of 409.  But no evidence of any acidosis. ? ?Patient's sugars could have been high this morning because of deviation from the diet.  They do have a home health nurse that comes out once a week.  She did make note that his medication list includes an insulin sliding scale as well as long-acting and short acting insulin.  But according to family member that he has never been getting that. ? ?Does have primary care doctor that could help with just.  We will give 5 units of regular insulin here.  I did give her a prescription for sliding scale.  But there may be some difficulties in training and using syringes and the needles.  Patient's caregiver used to take care of her of her husband's diabetes but its been a long time since she use the regular insulin syringes.  He is now on an insulin pen. ?  ?We will see if we get some resources to maybe retrain her on how to do the insulin syringes.  If not we  may just of this follow-up with primary care doctor since there is no complications in the short run here from the diabetes. ?  ?  ?Patient's blood sugar down to 277 he did receive a little bit of regular insulin.  We do not have a diabetic resource nurse here today with it being Easter Sunday.  Patient given prescription for sliding scale and given a starter kit for syringes.  We will have him follow-up with primary care doctor as well as home health nurse that has been coming out.  Would recommend that the patient follow diet very carefully.  And continue his metformin and other medications. ?  ?  ?Final Clinical Impression(s) / ED Diagnoses ?Final diagnoses:  ?Hyperglycemia  ?Type 2 diabetes mellitus without complication, unspecified whether long term insulin use ([place])  ?  ?  ?Rx / DC Orders ?ED Discharge Orders   ?  ?        Ordered  ?    insulin lispro (HUMALOG) 100 UNIT/ML injection       ? 08/11/21 1402  ?  ?   ?  ?  ?   ?  ?  ?  ?Fredia Sorrow, MD ?08/11/21 1445 ?  ?  ?Fredia Sorrow, MD ?08/11/21 1600 ? ?HPI ? ? ?Prior to Admission medications   ?  Medication Sig Start Date End Date Taking? Authorizing Provider  ?albuterol (VENTOLIN HFA) 108 (90 Base) MCG/ACT inhaler Inhale 2 puffs into the lungs every 6 (six) hours as needed for wheezing or shortness of breath. 01/19/19  Yes Icard, Bradley L, DO  ?blood glucose meter kit and supplies KIT Dispense based on patient and insurance preference. Use up to four times daily as directed. 10/23/20  Yes Elodia Florence., MD  ?Blood Pressure Monitoring (BLOOD PRESSURE MONITOR/ARM) DEVI Check BP once a day. DX I 10 04/18/19  Yes Eathen Budreau, Ines Bloomer, MD  ?dexamethasone (DECADRON) 4 MG tablet Take 1 tablet (4 mg total) by mouth every 12 (twelve) hours. 07/15/21  Yes Regalado, Belkys A, MD  ?diphenhydrAMINE-zinc acetate (BENADRYL) cream Apply topically 2 (two) times daily as needed for itching. 07/15/21  Yes Regalado, Belkys A, MD  ?folic acid (FOLVITE) 1 MG tablet  Take 1 tablet (1 mg total) by mouth daily. 05/01/21  Yes Najma Bozarth, Ines Bloomer, MD  ?gabapentin (NEURONTIN) 100 MG capsule Take 1 capsule (100 mg total) by mouth 2 (two) times daily. 07/15/21  Yes Regalado, Belkys A, MD  ?latanoprost (XALATAN) 0.005 % ophthalmic solution Place 1 drop into both eyes at bedtime. 01/24/21  Yes Ailene Ards, NP  ?levETIRAcetam (KEPPRA) 1000 MG tablet Take 1 tablet by mouth twice daily ?Patient taking differently: Take 1,000 mg by mouth in the morning and at bedtime. 05/29/21  Yes Tamzin Bertling, Ines Bloomer, MD  ?metFORMIN (GLUCOPHAGE) 500 MG tablet TAKE 1 TABLET BY MOUTH TWICE DAILY WITH MEALS ?Patient taking differently: Take 500 mg by mouth 2 (two) times daily with a meal. 06/25/21  Yes Hrithik Boschee, Ines Bloomer, MD  ?multivitamin (ONE-A-DAY MEN'S) TABS tablet Take 1 tablet by mouth daily with breakfast.   Yes [provider]  ?pantoprazole (PROTONIX) 40 MG tablet Take 1 tablet (40 mg total) by mouth daily. ?Patient taking differently: Take 40 mg by mouth daily before breakfast. 05/01/21  Yes Lenny Fiumara, Ines Bloomer, MD  ?rosuvastatin (CRESTOR) 20 MG tablet Take 20 mg by mouth daily.   Yes [provider]  ?amLODipine (NORVASC) 5 MG tablet Take 1 tablet (5 mg total) by mouth daily. 08/20/21 02/16/22  Horald Pollen, MD  ? ? ?No Known Allergies ? ?Patient Active Problem List  ? Diagnosis Date Noted  ? Shingles rash 07/07/2021  ? Cognitive impairment 07/04/2021  ? Fall at home, initial encounter 07/03/2021  ? Left leg weakness 07/03/2021  ? Leukopenia with mild neutropenia 07/02/2021  ? Anemia associated with chemotherapy 07/02/2021  ? 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 Port-A-Cath in place 12/14/2020  ? Brain metastases   ? Palliative care by specialist   ? Metastatic lung cancer (metastasis from lung to other site) [facility]) 10/16/2020  ? Diabetes mellitus type 2 in nonobese ([place]) 10/16/2020  ? Glaucoma 10/16/2020  ? Atherosclerosis of aorta ([place])  05/31/2020  ? Primary cancer of right upper lobe of lung ([place]) 02/07/2019  ? Lung nodule 02/02/2019  ? Hypertension 05/24/2018  ? Dyslipidemia 05/24/2018  ? Dyslipidemia associated with type 2 diabetes mellitus ([place]) 06/09/2017  ? Goals of care, counseling/discussion 01/01/2017  ? ? ?Past Medical History:  ?Diagnosis Date  ? Allergy   ? Diabetes mellitus without complication ([place])   ? Type II  ? Glaucoma 10/16/2020  ? History of radiation therapy   ? [place]- SBRT 02/21/19-02/28/19, Rt Lung- IMRT 02/29/20-04/16/20 Dr. Gery Pray  ? History of radiation therapy 10/30/2020  ? whole brain 10/17/2020-10/30/2020  Dr Gery Pray  ? Hyperlipidemia   ? Hypertension   ? Lung cancer ([place])   ? Stage IA2 (cT1b, N0) non-small cell lung cancer (adenocarcinoma)  ? ? ?Past Surgical History:  ?Procedure Laterality Date  ? ELECTROMAGNETIC NAVIGATION BROCHOSCOPY Right 02/02/2019  ? Procedure: VIDEO BRONCHOSCOPY WITH NAVIGATION AND FIDUCIAL PLACEMENT;  Surgeon: Garner Nash, DO;  Location: [place];  Service: Cardiopulmonary;  Laterality: Right;  ? FUDUCIAL PLACEMENT Right 02/02/2019  ? Procedure: Placement Of Fuducial Right Upper Lobe;  Surgeon: Garner Nash, DO;  Location: [place];  Service: Cardiopulmonary;  Laterality: Right;  ? IR IMAGING GUIDED PORT INSERTION  12/13/2020  ? none    ? VIDEO BRONCHOSCOPY WITH RADIAL ENDOBRONCHIAL ULTRASOUND Right 02/02/2019  ? Procedure: Video Bronchoscopy With Radial Endobronchial Ultrasound;  Surgeon: Garner Nash, DO;  Location: [place];  Service: Cardiopulmonary;  Laterality: Right;  ? ? ?Social History  ? ?Socioeconomic History  ? Marital status: Single  ?  Spouse name: Not on file  ? Number of children: 0  ? Years of education: Not on file  ? Highest education level: 9th grade  ?Occupational History  ? Not on file  ?Tobacco Use  ? Smoking status: Former  ?  Packs/day: 1.00  ?  Years: 48.00  ?  Pack years: 48.00  ?  Types: Cigarettes  ?  Quit date: 02/20/2015  ?  Years since quitting: 6.5  ?  Smokeless tobacco: Former  ?  Quit date: 10/2013  ?Vaping Use  ? Vaping Use: Never used  ?Substance and Sexual Activity  ? Alcohol use: No  ? Drug use: No  ? Sexual activity: Yes  ?  Birth control/pr

## 2021-08-20 NOTE — Assessment & Plan Note (Signed)
Stable.  Continue rosuvastatin 20 mg daily. ?

## 2021-08-20 NOTE — Assessment & Plan Note (Signed)
Well-controlled hypertension.  Continue amlodipine 5 mg daily. ?BP Readings from Last 3 Encounters:  ?08/20/21 124/68  ?08/11/21 138/78  ?08/09/21 (!) 150/80  ?Uncontrolled diabetes with hemoglobin A1c at 10.5. ?See management below. ? ?

## 2021-08-20 NOTE — Assessment & Plan Note (Signed)
Uncontrolled diabetes with hemoglobin A1c at 10.5. ?Recommend to stop insulin regimen and start weekly Mounjaro 5 mg and Jardiance 10 mg daily along with metformin 500 mg twice a day. ?Diet and nutrition discussed. ?Follow-up in 4 weeks. ? ?

## 2021-08-20 NOTE — Patient Instructions (Signed)
Stop insulin regimen. ?Start weekly Mounjaro 5 mg ?Start Jardiance 10 mg daily ?Continue metformin ?Follow-up in 4 weeks ? ?Diabetes Mellitus and Nutrition, Adult ?When you have diabetes, or diabetes mellitus, it is very important to have healthy eating habits because your blood sugar (glucose) levels are greatly affected by what you eat and drink. Eating healthy foods in the right amounts, at about the same times every day, can help you: ?Manage your blood glucose. ?Lower your risk of heart disease. ?Improve your blood pressure. ?Reach or maintain a healthy weight. ?What can affect my meal plan? ?Every person with diabetes is different, and each person has different needs for a meal plan. Your health care provider may recommend that you work with a dietitian to make a meal plan that is best for you. Your meal plan may vary depending on factors such as: ?The calories you need. ?The medicines you take. ?Your weight. ?Your blood glucose, blood pressure, and cholesterol levels. ?Your activity level. ?Other health conditions you have, such as heart or kidney disease. ?How do carbohydrates affect me? ?Carbohydrates, also called carbs, affect your blood glucose level more than any other type of food. Eating carbs raises the amount of glucose in your blood. ?It is important to know how many carbs you can safely have in each meal. This is different for every person. Your dietitian can help you calculate how many carbs you should have at each meal and for each snack. ?How does alcohol affect me? ?Alcohol can cause a decrease in blood glucose (hypoglycemia), especially if you use insulin or take certain diabetes medicines by mouth. Hypoglycemia can be a life-threatening condition. Symptoms of hypoglycemia, such as sleepiness, dizziness, and confusion, are similar to symptoms of having too much alcohol. ?Do not drink alcohol if: ?Your health care provider tells you not to drink. ?You are pregnant, may be pregnant, or are  planning to become pregnant. ?If you drink alcohol: ?Limit how much you have to: ?0-1 drink a day for women. ?0-2 drinks a day for men. ?Know how much alcohol is in your drink. In the [place] one drink equals one 12 oz bottle of beer (355 mL), one 5 oz glass of wine (148 mL), or one 1? oz glass of hard liquor (44 mL). ?Keep yourself hydrated with water, diet soda, or unsweetened iced tea. Keep in mind that regular soda, juice, and other mixers may contain a lot of sugar and must be counted as carbs. ?What are tips for following this plan? ? ?Reading food labels ?Start by checking the serving size on the Nutrition Facts label of packaged foods and drinks. The number of calories and the amount of carbs, fats, and other nutrients listed on the label are based on one serving of the item. Many items contain more than one serving per package. ?Check the total grams (g) of carbs in one serving. ?Check the number of grams of saturated fats and trans fats in one serving. Choose foods that have a low amount or none of these fats. ?Check the number of milligrams (mg) of salt (sodium) in one serving. Most people should limit total sodium intake to less than 2,300 mg per day. ?Always check the nutrition information of foods labeled as "low-fat" or "nonfat." These foods may be higher in added sugar or refined carbs and should be avoided. ?Talk to your dietitian to identify your daily goals for nutrients listed on the label. ?Shopping ?Avoid buying canned, pre-made, or processed foods. These foods tend to be  high in fat, sodium, and added sugar. ?Shop around the outside edge of the grocery store. This is where you will most often find fresh fruits and vegetables, bulk grains, fresh meats, and fresh dairy products. ?Cooking ?Use low-heat cooking methods, such as baking, instead of high-heat cooking methods, such as deep frying. ?Cook using healthy oils, such as olive, canola, or sunflower oil. ?Avoid cooking with butter, cream, or  high-fat meats. ?Meal planning ?Eat meals and snacks regularly, preferably at the same times every day. Avoid going long periods of time without eating. ?Eat foods that are high in fiber, such as fresh fruits, vegetables, beans, and whole grains. ?Eat 4-6 oz (112-168 g) of lean protein each day, such as lean meat, chicken, fish, eggs, or tofu. One ounce (oz) (28 g) of lean protein is equal to: ?1 oz (28 g) of meat, chicken, or fish. ?1 egg. ?? cup (62 g) of tofu. ?Eat some foods each day that contain healthy fats, such as avocado, nuts, seeds, and fish. ?What foods should I eat? ?Fruits ?Berries. Apples. Oranges. Peaches. Apricots. Plums. Grapes. Mangoes. Papayas. Pomegranates. Kiwi. Cherries. ?Vegetables ?Leafy greens, including lettuce, spinach, kale, chard, collard greens, mustard greens, and cabbage. Beets. Cauliflower. Broccoli. Carrots. Green beans. Tomatoes. Peppers. Onions. Cucumbers. Brussels sprouts. ?Grains ?Whole grains, such as whole-wheat or whole-grain bread, crackers, tortillas, cereal, and pasta. Unsweetened oatmeal. Quinoa. Brown or wild rice. ?Meats and other proteins ?Seafood. Poultry without skin. Lean cuts of poultry and beef. Tofu. Nuts. Seeds. ?Dairy ?Low-fat or fat-free dairy products such as milk, yogurt, and cheese. ?The items listed above may not be a complete list of foods and beverages you can eat and drink. Contact a dietitian for more information. ?What foods should I avoid? ?Fruits ?Fruits canned with syrup. ?Vegetables ?Canned vegetables. Frozen vegetables with butter or cream sauce. ?Grains ?Refined white flour and flour products such as bread, pasta, snack foods, and cereals. Avoid all processed foods. ?Meats and other proteins ?Fatty cuts of meat. Poultry with skin. Breaded or fried meats. Processed meat. Avoid saturated fats. ?Dairy ?Full-fat yogurt, cheese, or milk. ?Beverages ?Sweetened drinks, such as soda or iced tea. ?The items listed above may not be a complete list of  foods and beverages you should avoid. Contact a dietitian for more information. ?Questions to ask a health care provider ?Do I need to meet with a certified diabetes care and education specialist? ?Do I need to meet with a dietitian? ?What number can I call if I have questions? ?When are the best times to check my blood glucose? ?Where to find more information: ?American Diabetes Association: diabetes.org ?Academy of Nutrition and Dietetics: eatright.org ?[employer] of Diabetes and Digestive and Kidney Diseases: AmenCredit.is ?Association of Diabetes Care & Education Specialists: diabeteseducator.org ?Summary ?It is important to have healthy eating habits because your blood sugar (glucose) levels are greatly affected by what you eat and drink. It is important to use alcohol carefully. ?A healthy meal plan will help you manage your blood glucose and lower your risk of heart disease. ?Your health care provider may recommend that you work with a dietitian to make a meal plan that is best for you. ?This information is not intended to replace advice given to you by your health care provider. Make sure you discuss any questions you have with your health care provider. ?Document Revised: 11/23/2019 Document Reviewed: 11/23/2019 ?Elsevier Patient Education ? [place]. ? ?

## 2021-08-20 NOTE — Assessment & Plan Note (Signed)
Multifactorial.  Metastatic lung cancer and uncontrolled diabetes accounting for most of his general weakness and urinary frequency. ?At risk for falls.  Refer to physical therapy. ?

## 2021-08-20 NOTE — Telephone Encounter (Signed)
Per 4/18 in [place] called and spoke to pt sister abut appointment.  Pt sister confirmed appointment  ?

## 2021-08-21 ENCOUNTER — Other Ambulatory Visit: Payer: Medicare HMO

## 2021-08-21 ENCOUNTER — Ambulatory Visit: Payer: Medicare HMO | Admitting: Podiatry

## 2021-08-22 ENCOUNTER — Telehealth: Payer: Self-pay | Admitting: Emergency Medicine

## 2021-08-22 NOTE — Telephone Encounter (Signed)
Connected to [facility] 4.9.2023. ? ?Caller states her brother blood sugar reading is over 500, acting normal. Took regular BG. Taking steroids daily. [place] [facility] discharged last week (08/04/21), they didn't call in the insulin like they said they would. BGs were normal until today. Metformin. ? ?Advised to go to ED.  ?

## 2021-08-23 NOTE — Telephone Encounter (Signed)
Agree with ED  

## 2021-08-26 ENCOUNTER — Telehealth: Payer: Self-pay | Admitting: Emergency Medicine

## 2021-08-26 DIAGNOSIS — R350 Frequency of micturition: Secondary | ICD-10-CM

## 2021-08-26 DIAGNOSIS — Z742 Need for assistance at home and no other household member able to render care: Secondary | ICD-10-CM

## 2021-08-26 DIAGNOSIS — R300 Dysuria: Secondary | ICD-10-CM

## 2021-08-26 NOTE — Telephone Encounter (Signed)
Okay for all of the above.  Thanks.

## 2021-08-26 NOTE — Telephone Encounter (Signed)
Called [facility] for verbals for PT as well as to inform her that orders for [facility] Social work , UA and a urine culture has been ordered. Will fax orders over to [place].  ?

## 2021-08-26 NOTE — Telephone Encounter (Signed)
[facility] calls today in regards to their Physical Therapy evaluation. [facility] would like to set up [facility] physical therapy visits under ?Twice a week for 3 weeks ?Once a week for 4 weeks ?They would also like an order for a Medical Social Work evaluation be put in for patient. ?An FYI regarding PT is PT is having difficulty walking and getting up the steps. PT states more frequent urination, as well as burning sensation during urination. PT has a nursing visit on Wednesday and would be willing to gather a urine sample if an order is put in/ if Dr.Sagardia suggests it! ? ?CB: [phone] ?Fax: [phone]  ?

## 2021-08-27 ENCOUNTER — Telehealth: Payer: Self-pay | Admitting: Emergency Medicine

## 2021-08-27 NOTE — Telephone Encounter (Signed)
Called Brian at [facility] to ok verbals per provider  ?

## 2021-08-27 NOTE — Telephone Encounter (Signed)
[facility] calls today with an update regarding PT. They would like to inform Dr.Sagardia that PT has developed possible UTI. I see where the faxes were sent out for collection of a urine sample and urinalysis, they did state they are willing to take a verbal order for this as well through  ? ?CB: [phone] ?

## 2021-08-27 NOTE — Telephone Encounter (Signed)
Benjamin Willis from [place] is requesting verbal orders for [facility] nurse. ? ?1 week visits for 3 week  ?1 every 2 weeks for 6 weeks  ?2 prn visits for disease and medication management  ? DX: Lung cancer w/ brain mets,DM and HTN ? ?Please advise ok verbals  ?

## 2021-08-27 NOTE — Telephone Encounter (Signed)
Okay for verbal's.  Thank you.

## 2021-08-28 ENCOUNTER — Ambulatory Visit (HOSPITAL_COMMUNITY)
Admission: RE | Admit: 2021-08-28 | Discharge: 2021-08-28 | Disposition: A | Discharge status: Home / Self Care | Payer: Medicare HMO | Source: Ambulatory Visit | Attending: Internal Medicine | Admitting: Internal Medicine

## 2021-08-28 DIAGNOSIS — C7931 Secondary malignant neoplasm of brain: Secondary | ICD-10-CM | POA: Diagnosis not present

## 2021-08-28 DIAGNOSIS — G930 Cerebral cysts: Secondary | ICD-10-CM | POA: Diagnosis not present

## 2021-08-28 DIAGNOSIS — C3490 Malignant neoplasm of unspecified part of unspecified bronchus or lung: Secondary | ICD-10-CM | POA: Diagnosis not present

## 2021-08-28 DIAGNOSIS — J3489 Other specified disorders of nose and nasal sinuses: Secondary | ICD-10-CM | POA: Diagnosis not present

## 2021-08-28 DIAGNOSIS — Z85118 Personal history of other malignant neoplasm of bronchus and lung: Secondary | ICD-10-CM | POA: Diagnosis not present

## 2021-08-28 IMAGING — MR MR HEAD WO/W CM
11 of 14 series · 21 of 48 positions shown · IV contrast (gadavist)
Comparison: Brain MRI [DATE] and earlier.

CLINICAL DATA: 76-year-old male with a history of metastatic lung
cancer status post whole brain radiation last [REDACTED]. Progression of 4
treated lesions in [REDACTED]. Restaging.

EXAM:
MRI HEAD WITHOUT AND WITH CONTRAST
TECHNIQUE: Multiplanar, multiecho pulse sequences of the brain and surrounding
structures were obtained without and with intravenous contrast.
CONTRAST:  6mL GADAVIST GADOBUTROL 1 MMOL/ML IV SOLN

[Series 2: FLAIR · sagittal · 3.0mm · 0.51mm/px · 1 of 49 slices shown (1 of 2)]
[im 1/49]
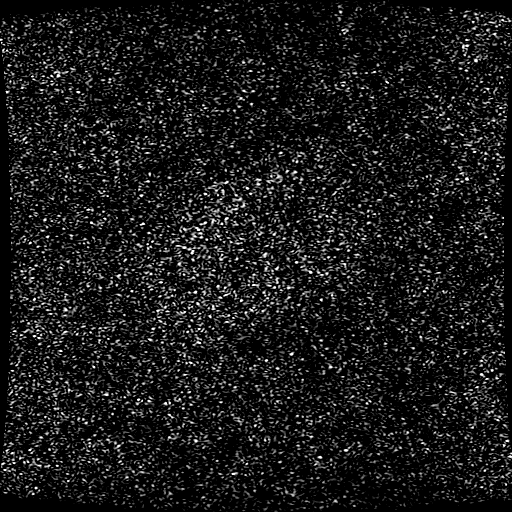

[Series 3: DWI · axial · 3.0mm · 1.02mm/px · z∈[-73,+98]mm · 3 of 116 slices shown]
[im 1/116]
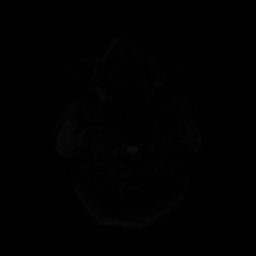
[im 58/116]
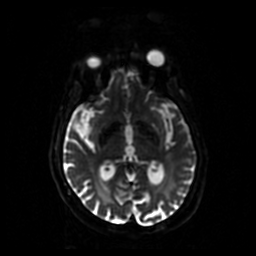
[im 116/116]
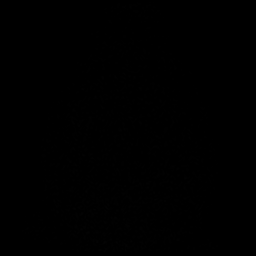

[Series 4: FLAIR · axial · 3.0mm · 0.51mm/px · 1 of 57 slices shown (2 of 2)]
[im 1/57]
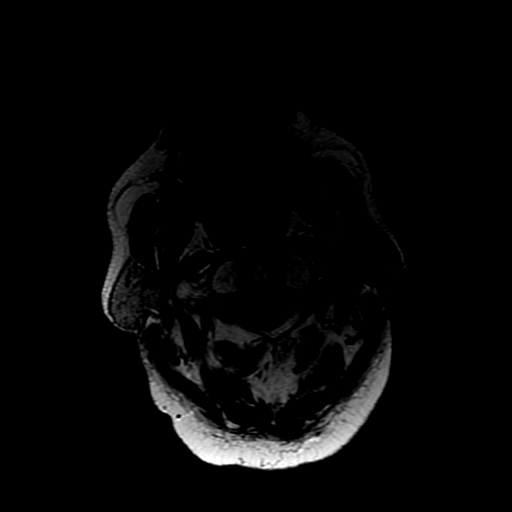

[Series 5: SWI · axial · 3.0mm · 0.51mm/px · z∈[-80,+92]mm · 3 of 116 slices shown (1 of 2)]
[im 1/116]
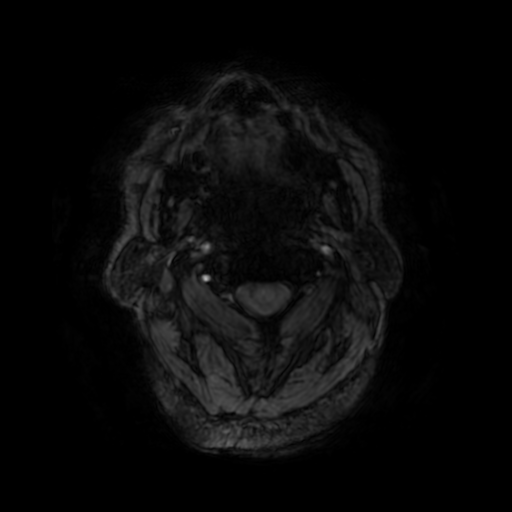
[im 58/116]
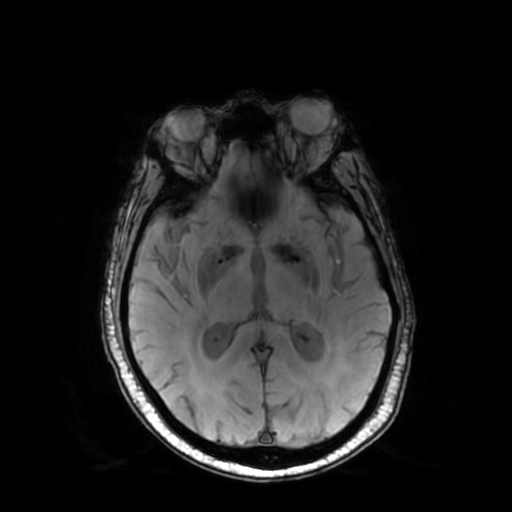
[im 116/116]
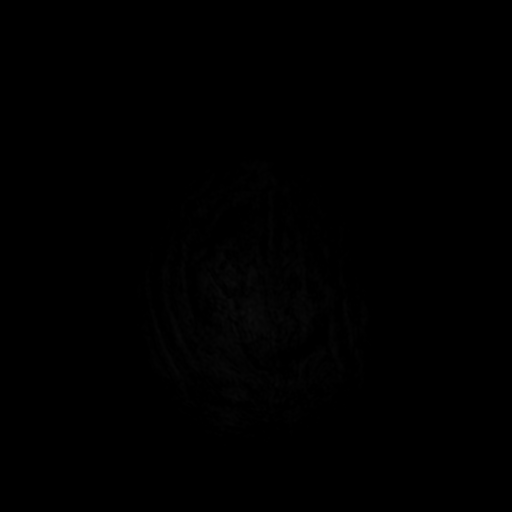

[Series 7: T2 post-contrast · coronal · 3.0mm · 0.39mm/px · 1 of 60 slices shown (1 of 2)]
[im 1/60]
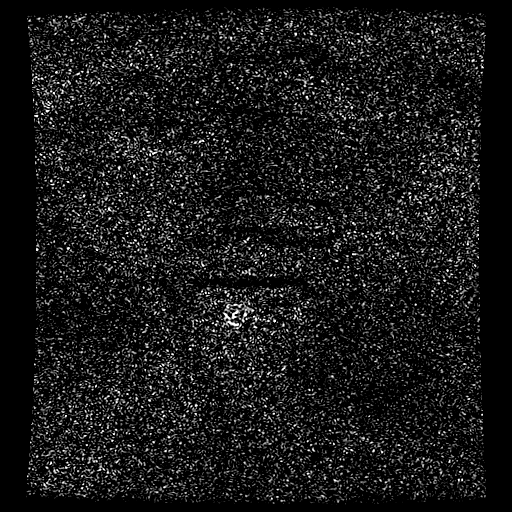

[Series 8: T2 post-contrast · axial · 5.0mm · 0.51mm/px · 1 of 29 slices shown (2 of 2)]
[im 1/29]
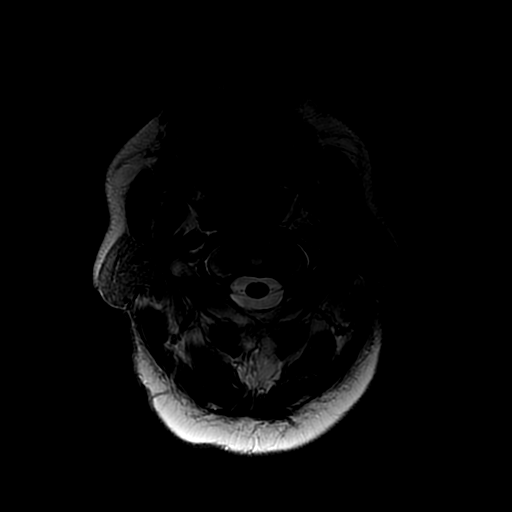

[Series 9: T1 post-contrast · coronal · 3.0mm · 0.39mm/px · 1 of 58 slices shown (1 of 2)]
[im 1/58]
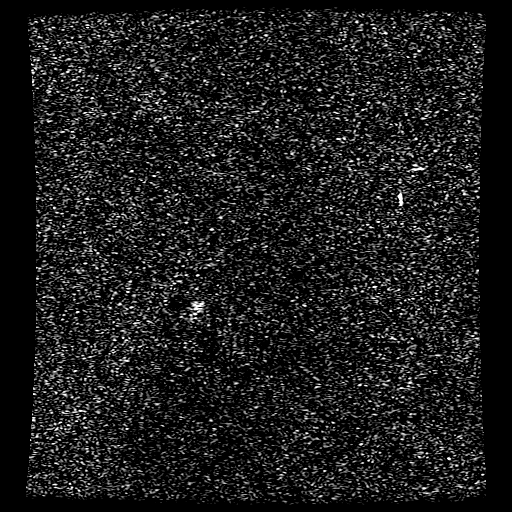

[Series 10: FLAIR post-contrast · sagittal · 3.0mm · 0.51mm/px · 1 of 49 slices shown]
[im 1/49]
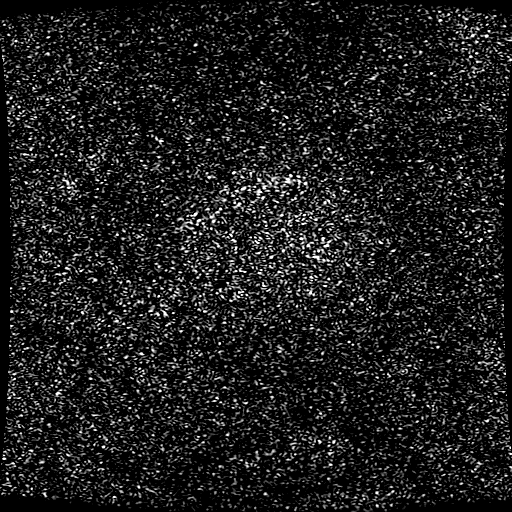

[Series 350: ADC · axial · 3.0mm · 1.02mm/px · 1 of 58 slices shown]
[im 1/58]
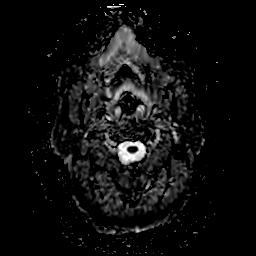

[Series 500: SWI · axial · 3.0mm · 0.51mm/px · 1 of 116 slices shown (2 of 2)]
[im 1/116]
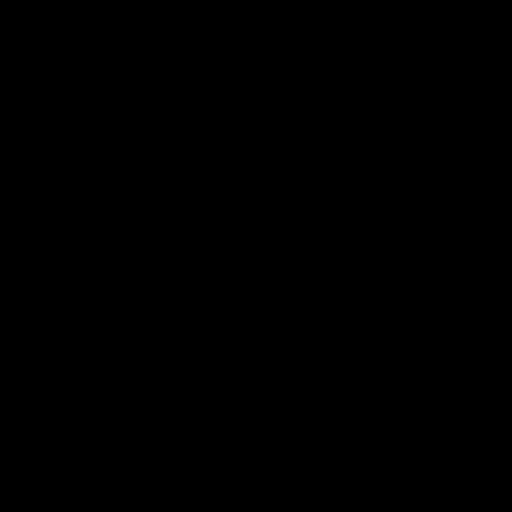

[Series 1100: T1 post-contrast · axial · 0.9mm · 0.50mm/px · z∈[-142,+113]mm · 7 of 301 slices shown (2 of 2)]
[im 1/301]
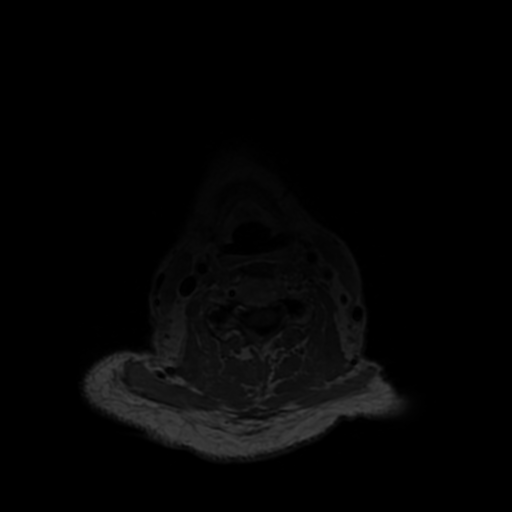
[im 51/301]
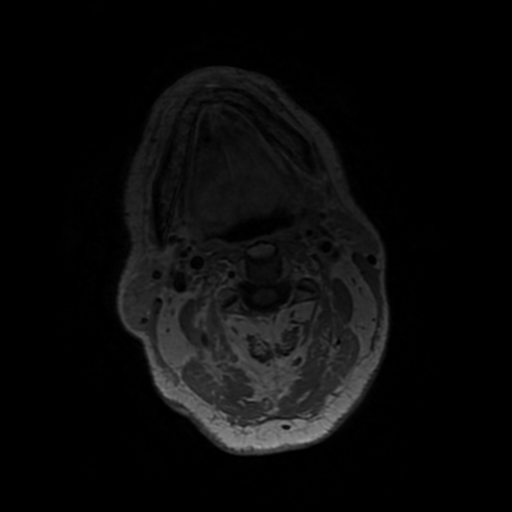
[im 101/301]
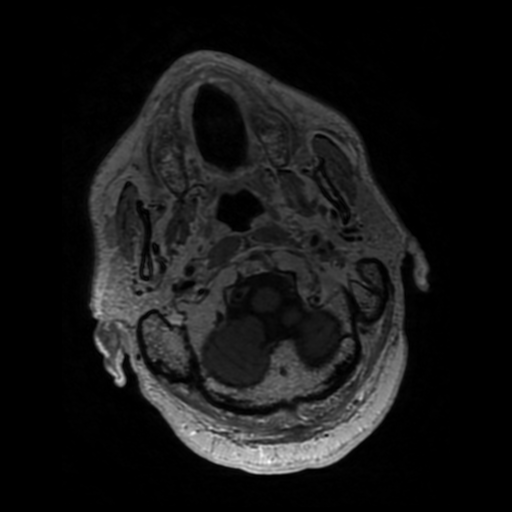
[im 151/301]
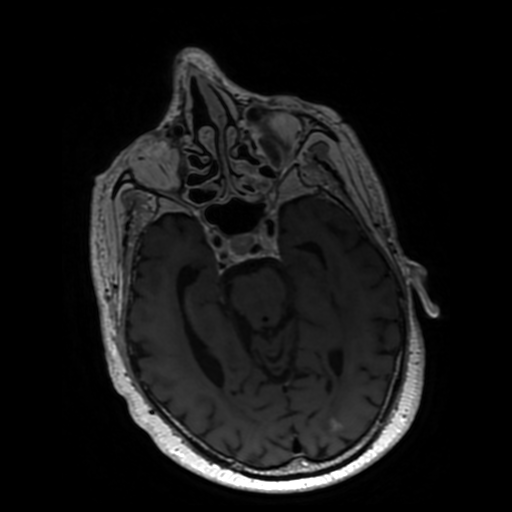
[im 201/301]
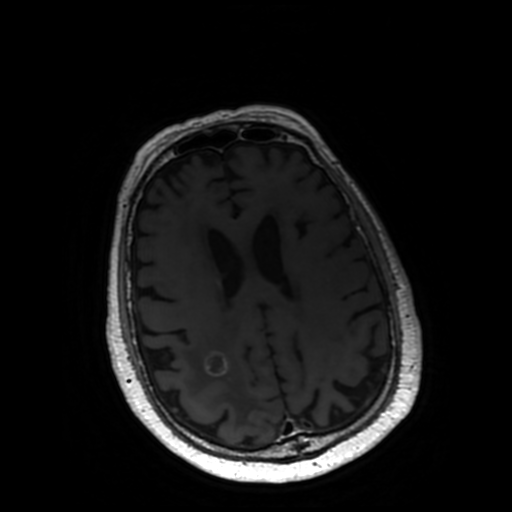
[im 251/301]
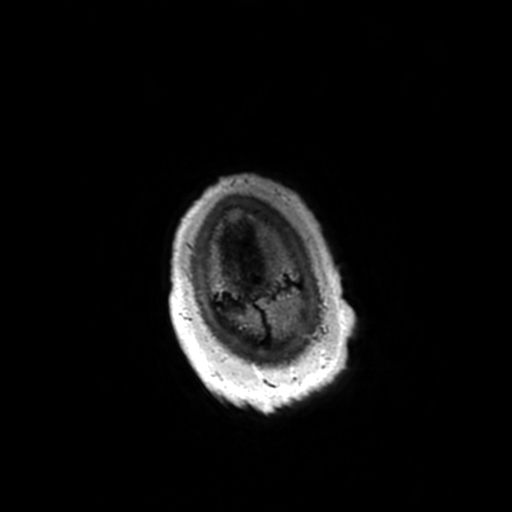
[im 301/301]
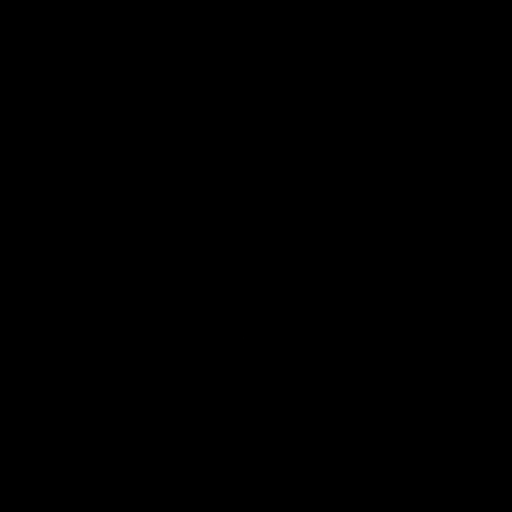

[21 of 48 positions shown; findings below may reference images not displayed]

FINDINGS: Brain: Black blood postcontrast imaging today.

Mixed cystic and nodular enhancing metastasis in the right
perirolandic cortex near the upper extremity representation area now
is roughly 19 x 24 mm but slightly decreased since [DATE] x 27
mm at that time). Regional T2 and FLAIR hyperintense edema appears
regressed but not resolved (series 4, image 40).

Medial right parietooccipital sulcus region mostly solid enhancing
metastasis is now 16 x 21 mm and stable to slightly larger since
[DATE] image 179). Regional T2 and FLAIR hyperintense
edema here also appears regressed but not resolved.

Smaller left occipital pole oval metastasis is now 14 mm and not
significantly changed on series [U6], image 144. Surrounding edema
there has regressed.

Similar size posterior right superior cerebellar metastasis near the
tentorium is now 15 mm and not significantly changed on series [U6],
image 117 and series 10, image 24. Surrounding edema has largely
resolved.

All lesions demonstrate some hemosiderin. No new metastasis or other
abnormal enhancement identified.

No superimposed restricted diffusion to suggest acute infarction. No
midline shift, ventriculomegaly, extra-axial collection or acute
intracranial hemorrhage. Cervicomedullary junction and pituitary are
within normal limits.

Background post whole brain radiation white matter T2 and FLAIR
hyperintensity is stable. Mild generalized cerebral volume loss
since [REDACTED] of last year.

Vascular: Major intracranial vascular flow voids are stable,
dominant right vertebral artery.

Skull and upper cervical spine: Visualized bone marrow signal is
within normal limits. Negative visible cervical spine and spinal
cord.

Sinuses/Orbits: Mild paranasal sinus mucosal thickening now. Orbits
appear stable and negative.

Other: Mild new mastoid effusions greater on the left. Otherwise
grossly normal visible internal auditory structures.
IMPRESSION: 1. No progression of disease.
Right perirolandic metastasis is slightly smaller since [REDACTED]
(now up to 2.4 cm long axis versus 2.7 cm previously). And the 3
remaining metastases have not significantly changed in size.
Vasogenic edema at all four sites appears regressed.

2. No new metastatic disease or new intracranial abnormality
identified.

## 2021-08-28 MED ORDER — GADOBUTROL 1 MMOL/ML IV SOLN
6.0000 mL | Freq: Once | INTRAVENOUS | Status: AC | PRN
  Administered 2021-08-28: 6 mL via INTRAVENOUS

## 2021-08-30 ENCOUNTER — Inpatient Hospital Stay: Payer: Medicare HMO

## 2021-08-30 ENCOUNTER — Inpatient Hospital Stay (HOSPITAL_BASED_OUTPATIENT_CLINIC_OR_DEPARTMENT_OTHER): Payer: Medicare HMO | Admitting: Hematology and Oncology

## 2021-08-30 ENCOUNTER — Other Ambulatory Visit: Payer: Self-pay

## 2021-08-30 VITALS — HR 94

## 2021-08-30 VITALS — BP 133/81 | HR 119 | Temp 98.5°F | Resp 17 | Wt 137.1 lb

## 2021-08-30 DIAGNOSIS — N390 Urinary tract infection, site not specified: Secondary | ICD-10-CM | POA: Diagnosis not present

## 2021-08-30 DIAGNOSIS — C3411 Malignant neoplasm of upper lobe, right bronchus or lung: Secondary | ICD-10-CM | POA: Diagnosis not present

## 2021-08-30 DIAGNOSIS — E1169 Type 2 diabetes mellitus with other specified complication: Secondary | ICD-10-CM | POA: Diagnosis not present

## 2021-08-30 DIAGNOSIS — R262 Difficulty in walking, not elsewhere classified: Secondary | ICD-10-CM | POA: Diagnosis not present

## 2021-08-30 DIAGNOSIS — Z7401 Bed confinement status: Secondary | ICD-10-CM | POA: Diagnosis not present

## 2021-08-30 DIAGNOSIS — R0902 Hypoxemia: Secondary | ICD-10-CM | POA: Diagnosis not present

## 2021-08-30 DIAGNOSIS — E119 Type 2 diabetes mellitus without complications: Secondary | ICD-10-CM | POA: Diagnosis not present

## 2021-08-30 DIAGNOSIS — N3000 Acute cystitis without hematuria: Secondary | ICD-10-CM | POA: Diagnosis not present

## 2021-08-30 DIAGNOSIS — R4182 Altered mental status, unspecified: Secondary | ICD-10-CM | POA: Diagnosis not present

## 2021-08-30 DIAGNOSIS — C3490 Malignant neoplasm of unspecified part of unspecified bronchus or lung: Secondary | ICD-10-CM

## 2021-08-30 DIAGNOSIS — G9341 Metabolic encephalopathy: Secondary | ICD-10-CM | POA: Diagnosis not present

## 2021-08-30 DIAGNOSIS — Z7984 Long term (current) use of oral hypoglycemic drugs: Secondary | ICD-10-CM | POA: Diagnosis not present

## 2021-08-30 DIAGNOSIS — T451X5A Adverse effect of antineoplastic and immunosuppressive drugs, initial encounter: Secondary | ICD-10-CM | POA: Diagnosis present

## 2021-08-30 DIAGNOSIS — R296 Repeated falls: Secondary | ICD-10-CM | POA: Diagnosis present

## 2021-08-30 DIAGNOSIS — M6281 Muscle weakness (generalized): Secondary | ICD-10-CM | POA: Diagnosis not present

## 2021-08-30 DIAGNOSIS — Z79899 Other long term (current) drug therapy: Secondary | ICD-10-CM | POA: Diagnosis not present

## 2021-08-30 DIAGNOSIS — Z923 Personal history of irradiation: Secondary | ICD-10-CM | POA: Diagnosis not present

## 2021-08-30 DIAGNOSIS — D6481 Anemia due to antineoplastic chemotherapy: Secondary | ICD-10-CM | POA: Diagnosis not present

## 2021-08-30 DIAGNOSIS — Z95828 Presence of other vascular implants and grafts: Secondary | ICD-10-CM | POA: Diagnosis not present

## 2021-08-30 DIAGNOSIS — R42 Dizziness and giddiness: Secondary | ICD-10-CM | POA: Diagnosis not present

## 2021-08-30 DIAGNOSIS — H409 Unspecified glaucoma: Secondary | ICD-10-CM | POA: Diagnosis not present

## 2021-08-30 DIAGNOSIS — R531 Weakness: Secondary | ICD-10-CM | POA: Diagnosis not present

## 2021-08-30 DIAGNOSIS — E032 Hypothyroidism due to medicaments and other exogenous substances: Secondary | ICD-10-CM

## 2021-08-30 DIAGNOSIS — D701 Agranulocytosis secondary to cancer chemotherapy: Secondary | ICD-10-CM | POA: Diagnosis not present

## 2021-08-30 DIAGNOSIS — Z85118 Personal history of other malignant neoplasm of bronchus and lung: Secondary | ICD-10-CM | POA: Diagnosis not present

## 2021-08-30 DIAGNOSIS — Z5112 Encounter for antineoplastic immunotherapy: Secondary | ICD-10-CM | POA: Diagnosis not present

## 2021-08-30 DIAGNOSIS — C7931 Secondary malignant neoplasm of brain: Secondary | ICD-10-CM

## 2021-08-30 DIAGNOSIS — W19XXXA Unspecified fall, initial encounter: Secondary | ICD-10-CM | POA: Diagnosis not present

## 2021-08-30 DIAGNOSIS — E785 Hyperlipidemia, unspecified: Secondary | ICD-10-CM | POA: Diagnosis not present

## 2021-08-30 DIAGNOSIS — E1165 Type 2 diabetes mellitus with hyperglycemia: Secondary | ICD-10-CM | POA: Diagnosis not present

## 2021-08-30 DIAGNOSIS — E46 Unspecified protein-calorie malnutrition: Secondary | ICD-10-CM | POA: Diagnosis not present

## 2021-08-30 DIAGNOSIS — R29818 Other symptoms and signs involving the nervous system: Secondary | ICD-10-CM | POA: Diagnosis not present

## 2021-08-30 DIAGNOSIS — Z5111 Encounter for antineoplastic chemotherapy: Secondary | ICD-10-CM | POA: Diagnosis not present

## 2021-08-30 DIAGNOSIS — R1310 Dysphagia, unspecified: Secondary | ICD-10-CM | POA: Diagnosis not present

## 2021-08-30 DIAGNOSIS — W19XXXD Unspecified fall, subsequent encounter: Secondary | ICD-10-CM | POA: Diagnosis not present

## 2021-08-30 DIAGNOSIS — R001 Bradycardia, unspecified: Secondary | ICD-10-CM | POA: Diagnosis not present

## 2021-08-30 DIAGNOSIS — I10 Essential (primary) hypertension: Secondary | ICD-10-CM | POA: Diagnosis not present

## 2021-08-30 DIAGNOSIS — E876 Hypokalemia: Secondary | ICD-10-CM | POA: Diagnosis not present

## 2021-08-30 DIAGNOSIS — Z87891 Personal history of nicotine dependence: Secondary | ICD-10-CM | POA: Diagnosis not present

## 2021-08-30 DIAGNOSIS — D708 Other neutropenia: Secondary | ICD-10-CM | POA: Diagnosis not present

## 2021-08-30 DIAGNOSIS — R410 Disorientation, unspecified: Secondary | ICD-10-CM | POA: Diagnosis not present

## 2021-08-30 DIAGNOSIS — Y92009 Unspecified place in unspecified non-institutional (private) residence as the place of occurrence of the external cause: Secondary | ICD-10-CM | POA: Diagnosis not present

## 2021-08-30 DIAGNOSIS — R5383 Other fatigue: Secondary | ICD-10-CM | POA: Diagnosis not present

## 2021-08-30 DIAGNOSIS — G936 Cerebral edema: Secondary | ICD-10-CM | POA: Diagnosis not present

## 2021-08-30 LAB — CMP (CANCER CENTER ONLY)
ALT: 15 U/L (ref 0–44)
AST: 16 U/L (ref 15–41)
Albumin: 3.9 g/dL (ref 3.5–5.0)
Alkaline Phosphatase: 84 U/L (ref 38–126)
Anion gap: 11 (ref 5–15)
BUN: 19 mg/dL (ref 8–23)
CO2: 27 mmol/L (ref 22–32)
Calcium: 9.5 mg/dL (ref 8.9–10.3)
Chloride: 106 mmol/L (ref 98–111)
Creatinine: 1.21 mg/dL (ref 0.61–1.24)
GFR, Estimated: >60 mL/min (ref >60)
Glucose, Bld: 283 mg/dL — ABNORMAL HIGH (ref 70–99)
Potassium: 3.5 mmol/L (ref 3.5–5.1)
Sodium: 144 mmol/L (ref 135–145)
Total Bilirubin: 0.4 mg/dL (ref 0.3–1.2)
Total Protein: 6.6 g/dL (ref 6.5–8.1)

## 2021-08-30 LAB — CBC WITH DIFFERENTIAL (CANCER CENTER ONLY)
Abs Immature Granulocytes: 0.1 10*3/uL — ABNORMAL HIGH (ref 0.00–0.07)
Basophils Absolute: 0 10*3/uL (ref 0.0–0.1)
Basophils Relative: 0 %
Eosinophils Absolute: 0 10*3/uL (ref 0.0–0.5)
Eosinophils Relative: 0 %
HCT: 38.9 % — ABNORMAL LOW (ref 39.0–52.0)
Hemoglobin: 12.7 g/dL — ABNORMAL LOW (ref 13.0–17.0)
Immature Granulocytes: 1 %
Lymphocytes Relative: 8 %
Lymphs Abs: 0.7 10*3/uL (ref 0.7–4.0)
MCH: 32.1 pg (ref 26.0–34.0)
MCHC: 32.6 g/dL (ref 30.0–36.0)
MCV: 98.2 fL (ref 80.0–100.0)
Monocytes Absolute: 0.9 10*3/uL (ref 0.1–1.0)
Monocytes Relative: 10 %
Neutro Abs: 7.3 10*3/uL (ref 1.7–7.7)
Neutrophils Relative %: 81 %
Platelet Count: 309 10*3/uL (ref 150–400)
RBC: 3.96 MIL/uL — ABNORMAL LOW (ref 4.22–5.81)
RDW: 14.4 % (ref 11.5–15.5)
WBC Count: 9.1 10*3/uL (ref 4.0–10.5)
nRBC: 1 % — ABNORMAL HIGH (ref 0.0–0.2)

## 2021-08-30 LAB — TSH: TSH: 2.221 u[IU]/mL (ref 0.350–4.500)

## 2021-08-30 MED ORDER — SODIUM CHLORIDE 0.9% FLUSH
10.0000 mL | Freq: Once | INTRAVENOUS | Status: AC
  Administered 2021-08-30: 10 mL

## 2021-08-30 MED ORDER — SODIUM CHLORIDE 0.9 % IV SOLN
200.0000 mg | Freq: Once | INTRAVENOUS | Status: AC
  Administered 2021-08-30: 200 mg via INTRAVENOUS
  Filled 2021-08-30: qty 200

## 2021-08-30 MED ORDER — SODIUM CHLORIDE 0.9 % IV SOLN: Freq: Once | INTRAVENOUS | Status: AC

## 2021-08-30 MED ORDER — SODIUM CHLORIDE 0.9% FLUSH
10.0000 mL | INTRAVENOUS | Status: DC | PRN
  Administered 2021-08-30: 10 mL

## 2021-08-30 MED ORDER — CYANOCOBALAMIN 1000 MCG/ML IJ SOLN: 1000.0000 ug | Freq: Once | INTRAMUSCULAR | Status: DC

## 2021-08-30 MED ORDER — HEPARIN SOD (PORK) LOCK FLUSH 100 UNIT/ML IV SOLN
500.0000 [IU] | Freq: Once | INTRAVENOUS | Status: AC | PRN
  Administered 2021-08-30: 500 [IU]

## 2021-08-30 MED ORDER — PROCHLORPERAZINE MALEATE 10 MG PO TABS
10.0000 mg | ORAL_TABLET | Freq: Once | ORAL | Status: AC
  Administered 2021-08-30: 10 mg via ORAL
  Filled 2021-08-30: qty 1

## 2021-08-30 MED ORDER — SODIUM CHLORIDE 0.9 % IV SOLN
500.0000 mg/m2 | Freq: Once | INTRAVENOUS | Status: AC
  Administered 2021-08-30: 900 mg via INTRAVENOUS
  Filled 2021-08-30: qty 20

## 2021-08-30 NOTE — Progress Notes (Signed)
?[facility] ?[phone]   [phone] ? ?PROGRESS NOTE ? ?Patient Care Team: ?Horald Pollen, MD as PCP - General (Internal Medicine) ?Joretta Bachelor, PA as Physician Assistant (Physician Assistant) ? ?Hematological/Oncological History ?# Stage IA2 (cT1b, N0) non-small cell lung cancer (adenocarcinoma) ?-- SBRT to the right lung on 02/21/2019 through 02/28/2019 followed by radiation to additional lung nodules  ? 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  This was followed by an MRI of the brain with and without contrast which showed 4 brain lesions consistent with metastases, extensive vasogenic edema associated with the posterior right frontal lesion without midline shift. ?6/15-6/28/2022: 10 fractions of WBRT with Rad/Onc ?11/23/2020: Guardant 360 returns with no targetable mutation.  ?12/14/2020: Cycle 1 Day 1 of Carbo/Pem/Pem ?01/04/2021: Cycle 2 Day 1 of Carbo/Pem/Pem ?01/25/2021: Cycle 3 Day 1 of Carbo/Pem/Pem ?02/15/2021: Cycle 4 Day 1 of Carbo/Pem/Pem ?03/08/2021: Cycle 5 Day 1 of maintenance Pem/Pem ?03/29/2021: Cycle 6 Day 1 of maintenance Pem/Pem ?04/19/2021: Cycle 7 Day 1 of maintenance Pem/Pem ?05/16/2021: Cycle 8 Day 1 of maintenance Pem/Pem ?06/07/2021: Cycle 9 Day 1 of maintenance Pem/Pem ?06/28/2021: Cycle 10 Day 1 of maintenance Pem/Pem ?07/19/2021: Cycle 11 Day 1 of maintenance Pem/Pem (delayed 2/2 hospitalization due to worsening brain metastases) ?08/30/2021: Cycle 12 Day 1 of maintenance Pem/Pem ? ? ?Interval History:  ?Benjamin Willis. 76 y.o. 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 The patient's last visit was on 07/19/2021.  In the interim since the last visit he has completed Cycle  11 of maintenance chemotherapy. He presents today for Cycle 12 Day 1.  ? ?On exam today, Benjamin Willis reports he is unsure why his weight is dropping.  He is down 13 pounds from his prior visit.  He reports he is eating 3 meals a day and is trying to do his best to eat well.  He does endorse feeling "wiped out".  He does endorse fatigue.  He is not having any pain or headache/vision changes.  He denies any nausea vomiting or diarrhea at this time.  He reports that he does occasionally have difficulty making it to the bathroom at night and has had a few accidents..  He has no questions concerns or complaints today.  He currently denies any issues with fevers, chills, night sweats, shortness of breath, chest pain or cough.   A full 10 point ROS is listed below. ? ?MEDICAL HISTORY:  ?Past Medical History:  ?Diagnosis Date  ? Allergy   ? Diabetes mellitus without complication ([place])   ? Type II  ? Glaucoma 10/16/2020  ? History of radiation therapy   ? Thorax- SBRT 02/21/19-02/28/19, Rt Lung- IMRT 02/29/20-04/16/20 Dr. Gery Pray  ? History of radiation therapy 10/30/2020  ? whole brain 10/17/2020-10/30/2020 Dr Gery Pray  ? Hyperlipidemia   ? Hypertension   ? Lung cancer ([place])   ? Stage IA2 (cT1b, N0) non-small cell lung cancer (adenocarcinoma)  ? ? ?SURGICAL HISTORY: ?Past Surgical History:  ?Procedure Laterality Date  ? ELECTROMAGNETIC NAVIGATION BROCHOSCOPY Right 02/02/2019  ? Procedure: VIDEO BRONCHOSCOPY WITH NAVIGATION AND FIDUCIAL PLACEMENT;  Surgeon: Garner Nash, DO;  Location: [facility];  Service: Cardiopulmonary;  Laterality: Right;  ? FUDUCIAL PLACEMENT Right 02/02/2019  ? Procedure: Placement Of Fuducial Right Upper  Lobe;  Surgeon: Garner Nash, DO;  Location: [facility];  Service: Cardiopulmonary;  Laterality: Right;  ? IR IMAGING GUIDED PORT INSERTION  12/13/2020  ? none    ? VIDEO BRONCHOSCOPY WITH RADIAL ENDOBRONCHIAL ULTRASOUND Right 02/02/2019  ? Procedure: Video Bronchoscopy With Radial Endobronchial  Ultrasound;  Surgeon: Garner Nash, DO;  Location: [facility];  Service: Cardiopulmonary;  Laterality: Right;  ? ? ?SOCIAL HISTORY: ?Social History  ? ?Socioeconomic History  ? Marital status: Single  ?  Spouse name: Not on file  ? Number of children: 0  ? Years of education: Not on file  ? Highest education level: 9th grade  ?Occupational History  ? Not on file  ?Tobacco Use  ? Smoking status: Former  ?  Packs/day: 1.00  ?  Years: 48.00  ?  Pack years: 48.00  ?  Types: Cigarettes  ?  Quit date: 02/20/2015  ?  Years since quitting: 6.5  ? Smokeless tobacco: Former  ?  Quit date: 10/2013  ?Vaping Use  ? Vaping Use: Never used  ?Substance and Sexual Activity  ? Alcohol use: No  ? Drug use: No  ? Sexual activity: Yes  ?  Birth control/protection: Condom  ?Other Topics Concern  ? Not on file  ?Social History Narrative  ? Not on file  ? ?Social Determinants of Health  ? ?Financial Resource Strain: Not on file  ?Food Insecurity: Not on file  ?Transportation Needs: Not on file  ?Physical Activity: Not on file  ?Stress: Not on file  ?Social Connections: Not on file  ?Intimate Partner Violence: Not on file  ? ? ?FAMILY HISTORY: ?Family History  ?Problem Relation Age of Onset  ? Lung cancer Neg Hx   ? ? ?ALLERGIES:  has No Known Allergies. ? ?MEDICATIONS:  ?Current Outpatient Medications  ?Medication Sig Dispense Refill  ? albuterol (VENTOLIN HFA) 108 (90 Base) MCG/ACT inhaler Inhale 2 puffs into the lungs every 6 (six) hours as needed for wheezing or shortness of breath. 18 g 6  ? blood glucose meter kit and supplies KIT Dispense based on patient and insurance preference. Use up to four times daily as directed. 1 each 0  ? Blood Pressure Monitoring (BLOOD PRESSURE MONITOR/ARM) DEVI Check BP once a day. DX I 10 1 each 0  ? dexamethasone (DECADRON) 4 MG tablet Take 1 tablet (4 mg total) by mouth every 12 (twelve) hours. (Patient taking differently: Take 4 mg by mouth in the morning and at bedtime.) 60 tablet 1  ?  diphenhydrAMINE-zinc acetate (BENADRYL) cream Apply topically 2 (two) times daily as needed for itching. (Patient taking differently: Apply 1 application. topically 2 (two) times daily as needed for itching.) 28.4 g 0  ? empagliflozin (JARDIANCE) 10 MG TABS tablet Take 1 tablet (10 mg total) by mouth daily before breakfast. 90 tablet 1  ? folic acid (FOLVITE) 1 MG tablet Take 1 tablet (1 mg total) by mouth daily. 90 tablet 1  ? gabapentin (NEURONTIN) 100 MG capsule Take 1 capsule (100 mg total) by mouth 2 (two) times daily. 60 capsule 0  ? latanoprost (XALATAN) 0.005 % ophthalmic solution Place 1 drop into both eyes at bedtime. 2.5 mL 3  ? levETIRAcetam (KEPPRA) 1000 MG tablet Take 1 tablet by mouth twice daily (Patient taking differently: Take 1,000 mg by mouth in the morning and at bedtime.) 180 tablet 1  ? metFORMIN (GLUCOPHAGE) 500 MG tablet TAKE 1 TABLET BY MOUTH TWICE DAILY WITH MEALS (Patient taking differently: Take 500  mg by mouth 2 (two) times daily with a meal.) 180 tablet 0  ? metoprolol tartrate (LOPRESSOR) 25 MG tablet Take 1 tablet (25 mg total) by mouth 2 (two) times daily.    ? multivitamin (ONE-A-DAY MEN'S) TABS tablet Take 1 tablet by mouth daily with breakfast.    ? pantoprazole (PROTONIX) 40 MG tablet Take 1 tablet (40 mg total) by mouth daily. (Patient taking differently: Take 40 mg by mouth daily before breakfast.) 90 tablet 1  ? rosuvastatin (CRESTOR) 20 MG tablet Take 20 mg by mouth daily.    ? tirzepatide (MOUNJARO) 5 MG/0.5ML Pen Inject 5 mg into the skin once a week. (Patient taking differently: Inject 5 mg into the skin every Tuesday.) 2 mL 5  ? ?No current facility-administered medications for this visit.  ? ? ?REVIEW OF SYSTEMS:   ?Constitutional: ( - ) fevers, ( - )  chills , ( - ) night sweats ?Eyes: ( - ) blurriness of vision, ( - ) double vision, ( - ) watery eyes ?Ears, nose, mouth, throat, and face: ( - ) mucositis, ( - ) sore throat ?Respiratory: ( - ) cough, ( - ) dyspnea, ( -  ) wheezes ?Cardiovascular: ( - ) palpitation, ( - ) chest discomfort, ( - ) lower extremity swelling ?Gastrointestinal:  ( - ) nausea, ( - ) heartburn, ( - ) change in bowel habits ?Skin: ( - ) abnormal skin rashes ?

## 2021-08-30 NOTE — Patient Instructions (Signed)
[facility]  Discharge Instructions: Thank you for choosing [facility] to provide your oncology and hematology care.   If you have a lab appointment with the Cancer Center, please go directly to the Cancer Center and check in at the registration area.   Wear comfortable clothing and clothing appropriate for easy access to any Portacath or PICC line.   We strive to give you quality time with your provider. You may need to reschedule your appointment if you arrive late (15 or more minutes).  Arriving late affects you and other patients whose appointments are after yours.  Also, if you miss three or more appointments without notifying the office, you may be dismissed from the clinic at the provider's discretion.      For prescription refill requests, have your pharmacy contact our office and allow 72 hours for refills to be completed.    Today you received the following chemotherapy and/or immunotherapy agents keytruda, alimta      To help prevent nausea and vomiting after your treatment, we encourage you to take your nausea medication as directed.  BELOW ARE SYMPTOMS THAT SHOULD BE REPORTED IMMEDIATELY: *FEVER GREATER THAN 100.4 F (38 C) OR HIGHER *CHILLS OR SWEATING *NAUSEA AND VOMITING THAT IS NOT CONTROLLED WITH YOUR NAUSEA MEDICATION *UNUSUAL SHORTNESS OF BREATH *UNUSUAL BRUISING OR BLEEDING *URINARY PROBLEMS (pain or burning when urinating, or frequent urination) *BOWEL PROBLEMS (unusual diarrhea, constipation, pain near the anus) TENDERNESS IN MOUTH AND THROAT WITH OR WITHOUT PRESENCE OF ULCERS (sore throat, sores in mouth, or a toothache) UNUSUAL RASH, SWELLING OR PAIN  UNUSUAL VAGINAL DISCHARGE OR ITCHING   Items with * indicate a potential emergency and should be followed up as soon as possible or go to the Emergency Department if any problems should occur.  Please show the CHEMOTHERAPY ALERT CARD or IMMUNOTHERAPY ALERT CARD at  check-in to the Emergency Department and triage nurse.  Should you have questions after your visit or need to cancel or reschedule your appointment, please contact [facility]  Dept: [phone]  and follow the prompts.  Office hours are 8:00 a.m. to 4:30 p.m. Monday - Friday. Please note that voicemails left after 4:00 p.m. may not be returned until the following business day.  We are closed weekends and major holidays. You have access to a nurse at all times for urgent questions. Please call the main number to the clinic Dept: [phone] and follow the prompts.   For any non-urgent questions, you may also contact your provider using MyChart. We now offer e-Visits for anyone 18 and older to request care online for non-urgent symptoms. For details visit [email or web address].   Also download the MyChart app! Go to the app store, search "MyChart", open the app, select [facility], and log in with your MyChart username and password.  Due to Covid, a mask is required upon entering the hospital/clinic. If you do not have a mask, one will be given to you upon arrival. For doctor visits, patients may have 1 support person aged 18 or older with them. For treatment visits, patients cannot have anyone with them due to current Covid guidelines and our immunocompromised population.   

## 2021-09-02 ENCOUNTER — Inpatient Hospital Stay (HOSPITAL_COMMUNITY)
Admission: EM | Admit: 2021-09-02 | Discharge: 2021-09-10 | DRG: 689 | Disposition: A | Discharge status: Skilled Nursing Facility | Payer: Medicare HMO | Attending: Internal Medicine | Admitting: Internal Medicine

## 2021-09-02 ENCOUNTER — Emergency Department (HOSPITAL_COMMUNITY): Payer: Medicare HMO

## 2021-09-02 ENCOUNTER — Encounter (HOSPITAL_COMMUNITY): Payer: Self-pay | Admitting: Oncology

## 2021-09-02 ENCOUNTER — Other Ambulatory Visit: Payer: Self-pay

## 2021-09-02 ENCOUNTER — Inpatient Hospital Stay: Payer: Medicare HMO | Attending: Hematology and Oncology

## 2021-09-02 DIAGNOSIS — C7931 Secondary malignant neoplasm of brain: Secondary | ICD-10-CM | POA: Diagnosis present

## 2021-09-02 DIAGNOSIS — Z85118 Personal history of other malignant neoplasm of bronchus and lung: Secondary | ICD-10-CM | POA: Diagnosis not present

## 2021-09-02 DIAGNOSIS — E876 Hypokalemia: Secondary | ICD-10-CM | POA: Diagnosis not present

## 2021-09-02 DIAGNOSIS — E1165 Type 2 diabetes mellitus with hyperglycemia: Secondary | ICD-10-CM | POA: Diagnosis present

## 2021-09-02 DIAGNOSIS — R410 Disorientation, unspecified: Secondary | ICD-10-CM | POA: Diagnosis not present

## 2021-09-02 DIAGNOSIS — G9341 Metabolic encephalopathy: Secondary | ICD-10-CM | POA: Diagnosis present

## 2021-09-02 DIAGNOSIS — Z7984 Long term (current) use of oral hypoglycemic drugs: Secondary | ICD-10-CM | POA: Diagnosis not present

## 2021-09-02 DIAGNOSIS — C3490 Malignant neoplasm of unspecified part of unspecified bronchus or lung: Secondary | ICD-10-CM | POA: Diagnosis present

## 2021-09-02 DIAGNOSIS — Z79899 Other long term (current) drug therapy: Secondary | ICD-10-CM

## 2021-09-02 DIAGNOSIS — N3000 Acute cystitis without hematuria: Secondary | ICD-10-CM

## 2021-09-02 DIAGNOSIS — E785 Hyperlipidemia, unspecified: Secondary | ICD-10-CM | POA: Diagnosis present

## 2021-09-02 DIAGNOSIS — E119 Type 2 diabetes mellitus without complications: Secondary | ICD-10-CM | POA: Diagnosis not present

## 2021-09-02 DIAGNOSIS — T451X5A Adverse effect of antineoplastic and immunosuppressive drugs, initial encounter: Secondary | ICD-10-CM | POA: Diagnosis present

## 2021-09-02 DIAGNOSIS — W19XXXA Unspecified fall, initial encounter: Secondary | ICD-10-CM | POA: Diagnosis present

## 2021-09-02 DIAGNOSIS — R29818 Other symptoms and signs involving the nervous system: Secondary | ICD-10-CM | POA: Diagnosis not present

## 2021-09-02 DIAGNOSIS — Z87891 Personal history of nicotine dependence: Secondary | ICD-10-CM

## 2021-09-02 DIAGNOSIS — R531 Weakness: Secondary | ICD-10-CM | POA: Diagnosis not present

## 2021-09-02 DIAGNOSIS — R4182 Altered mental status, unspecified: Secondary | ICD-10-CM | POA: Diagnosis not present

## 2021-09-02 DIAGNOSIS — E1169 Type 2 diabetes mellitus with other specified complication: Secondary | ICD-10-CM | POA: Diagnosis present

## 2021-09-02 DIAGNOSIS — I152 Hypertension secondary to endocrine disorders: Secondary | ICD-10-CM | POA: Diagnosis present

## 2021-09-02 DIAGNOSIS — Y92009 Unspecified place in unspecified non-institutional (private) residence as the place of occurrence of the external cause: Secondary | ICD-10-CM

## 2021-09-02 DIAGNOSIS — H409 Unspecified glaucoma: Secondary | ICD-10-CM | POA: Diagnosis present

## 2021-09-02 DIAGNOSIS — I10 Essential (primary) hypertension: Secondary | ICD-10-CM | POA: Diagnosis not present

## 2021-09-02 DIAGNOSIS — Z923 Personal history of irradiation: Secondary | ICD-10-CM | POA: Diagnosis not present

## 2021-09-02 DIAGNOSIS — G936 Cerebral edema: Secondary | ICD-10-CM | POA: Diagnosis present

## 2021-09-02 DIAGNOSIS — R296 Repeated falls: Secondary | ICD-10-CM | POA: Diagnosis present

## 2021-09-02 DIAGNOSIS — N390 Urinary tract infection, site not specified: Principal | ICD-10-CM | POA: Diagnosis present

## 2021-09-02 DIAGNOSIS — D701 Agranulocytosis secondary to cancer chemotherapy: Secondary | ICD-10-CM | POA: Diagnosis present

## 2021-09-02 DIAGNOSIS — E1159 Type 2 diabetes mellitus with other circulatory complications: Secondary | ICD-10-CM | POA: Diagnosis present

## 2021-09-02 DIAGNOSIS — R0902 Hypoxemia: Secondary | ICD-10-CM | POA: Diagnosis not present

## 2021-09-02 DIAGNOSIS — R42 Dizziness and giddiness: Secondary | ICD-10-CM | POA: Diagnosis not present

## 2021-09-02 DIAGNOSIS — M6281 Muscle weakness (generalized): Secondary | ICD-10-CM | POA: Diagnosis not present

## 2021-09-02 LAB — URINALYSIS, ROUTINE W REFLEX MICROSCOPIC
Bilirubin Urine: NEGATIVE
Glucose, UA: >=500 mg/dL — AB
Ketones, ur: 80 mg/dL — AB
Nitrite: NEGATIVE
Protein, ur: 100 mg/dL — AB
Specific Gravity, Urine: 1.024 (ref 1.005–1.030)
pH: 5 (ref 5.0–8.0)

## 2021-09-02 LAB — BASIC METABOLIC PANEL
Anion gap: 15 (ref 5–15)
BUN: 21 mg/dL (ref 8–23)
CO2: 20 mmol/L — ABNORMAL LOW (ref 22–32)
Calcium: 8.8 mg/dL — ABNORMAL LOW (ref 8.9–10.3)
Chloride: 105 mmol/L (ref 98–111)
Creatinine, Ser: 1.06 mg/dL (ref 0.61–1.24)
GFR, Estimated: >60 mL/min (ref >60)
Glucose, Bld: 121 mg/dL — ABNORMAL HIGH (ref 70–99)
Potassium: 3.2 mmol/L — ABNORMAL LOW (ref 3.5–5.1)
Sodium: 140 mmol/L (ref 135–145)

## 2021-09-02 LAB — CBG MONITORING, ED: Glucose-Capillary: 125 mg/dL — ABNORMAL HIGH (ref 70–99)

## 2021-09-02 LAB — CBC
HCT: 39.4 % (ref 39.0–52.0)
Hemoglobin: 12.6 g/dL — ABNORMAL LOW (ref 13.0–17.0)
MCH: 32.9 pg (ref 26.0–34.0)
MCHC: 32 g/dL (ref 30.0–36.0)
MCV: 102.9 fL — ABNORMAL HIGH (ref 80.0–100.0)
Platelets: 235 10*3/uL (ref 150–400)
RBC: 3.83 MIL/uL — ABNORMAL LOW (ref 4.22–5.81)
RDW: 14.1 % (ref 11.5–15.5)
WBC: 9.5 10*3/uL (ref 4.0–10.5)
nRBC: 0.3 % — ABNORMAL HIGH (ref 0.0–0.2)

## 2021-09-02 LAB — GLUCOSE, CAPILLARY
Glucose-Capillary: 98 mg/dL (ref 70–99)
Glucose-Capillary: 147 mg/dL — ABNORMAL HIGH (ref 70–99)

## 2021-09-02 IMAGING — CT CT HEAD W/O CM
3 series · 14 of 47 positions shown, 16 images · non-contrast
Comparison: CT [DATE].  MRI [DATE].

CLINICAL DATA: Generalized weakness. Mental status change.
Chemotherapy and radiation therapy for metastatic lung cancer.



[Series 2: head wo · axial · 0.47mm/px · z∈[-157,-32]mm · 8 of 30 slices shown, 10 images]
[im 3/30  brain]
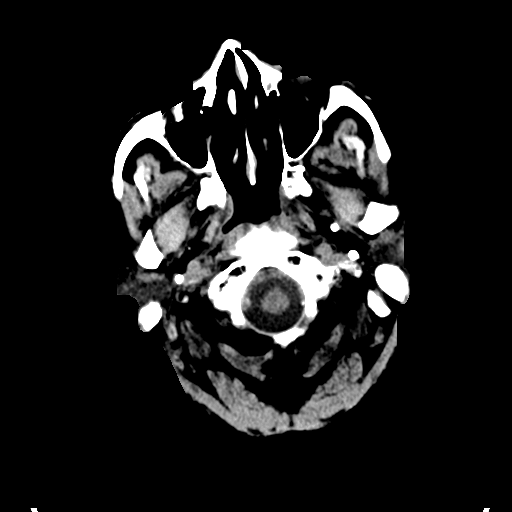
[im 3/30  bone]
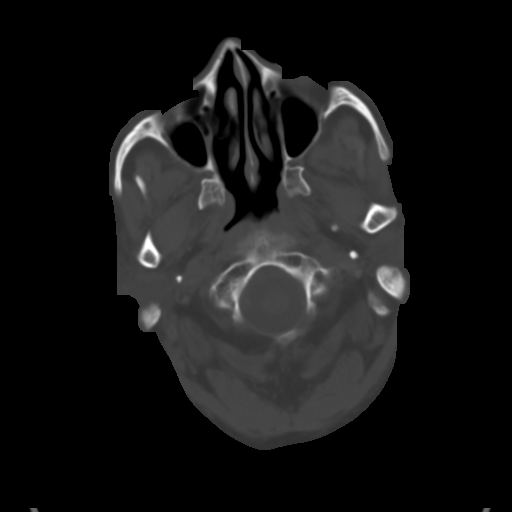
[im 7/30  brain]
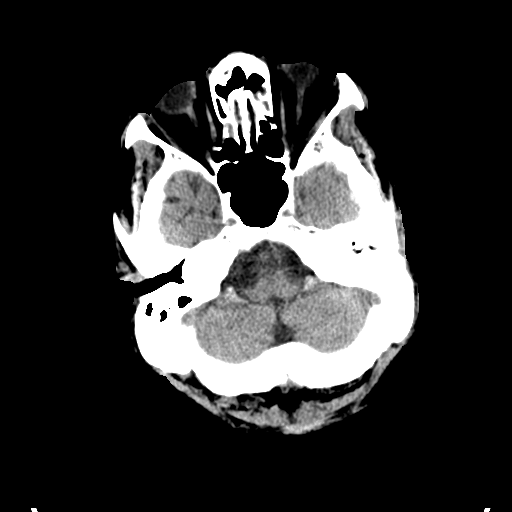
[im 10/30  brain]
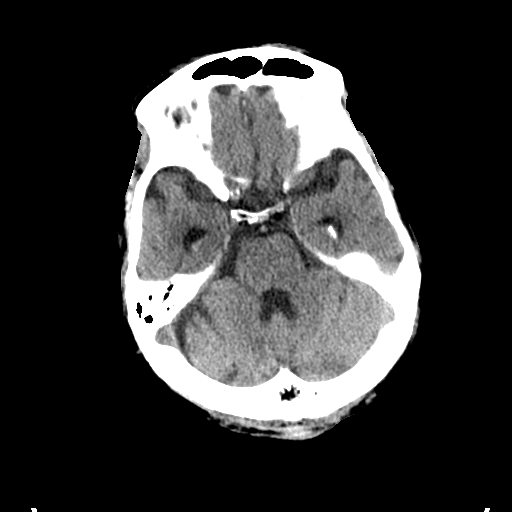
[im 14/30  brain]
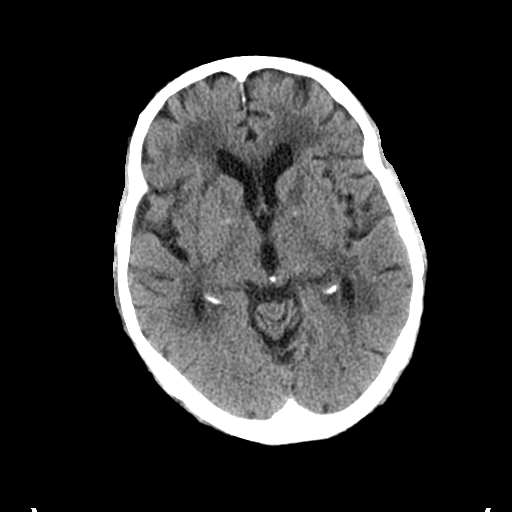
[im 17/30  brain]
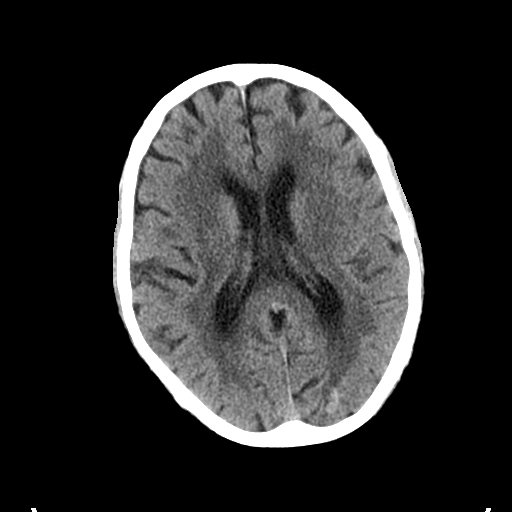
[im 17/30  bone]
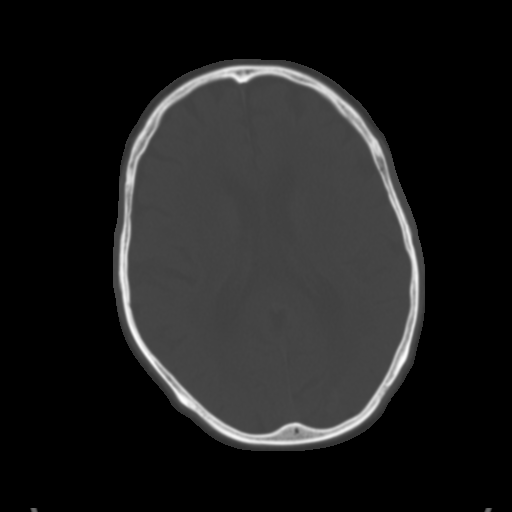
[im 21/30  brain]
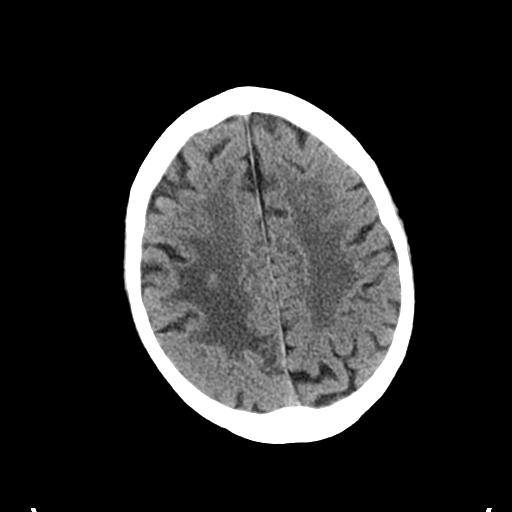
[im 24/30  brain]
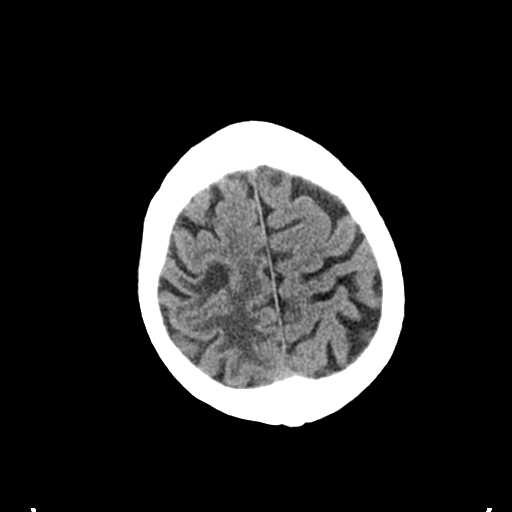
[im 28/30  brain]
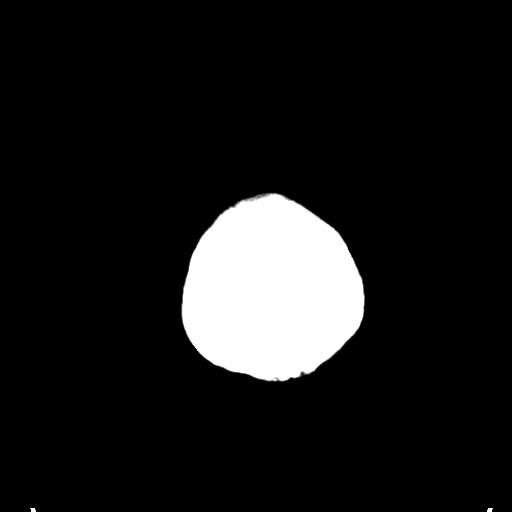

[Series 5: coronal soft tissue · coronal · 0.30mm/px · 3 of 71 slices shown]
[im 24/71  brain]
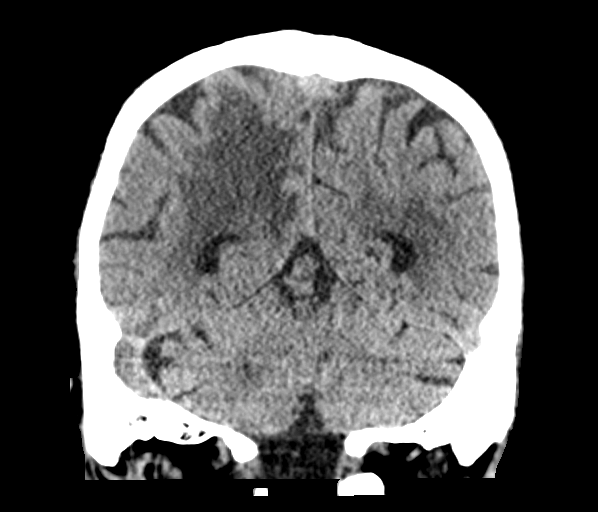
[im 32/71  brain]
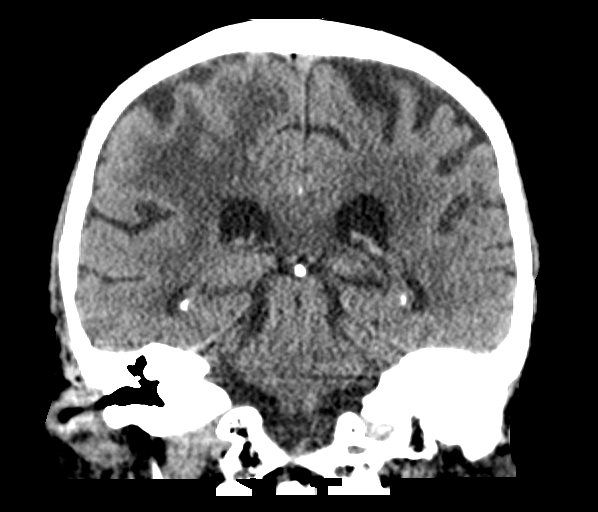
[im 39/71  brain]
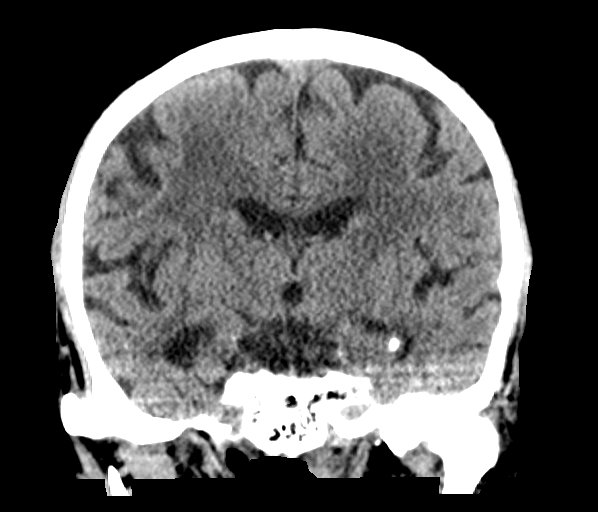

[Series 6: sagittal soft tissue · sagittal · 0.30mm/px · 3 of 60 slices shown]
[im 20/60  brain]
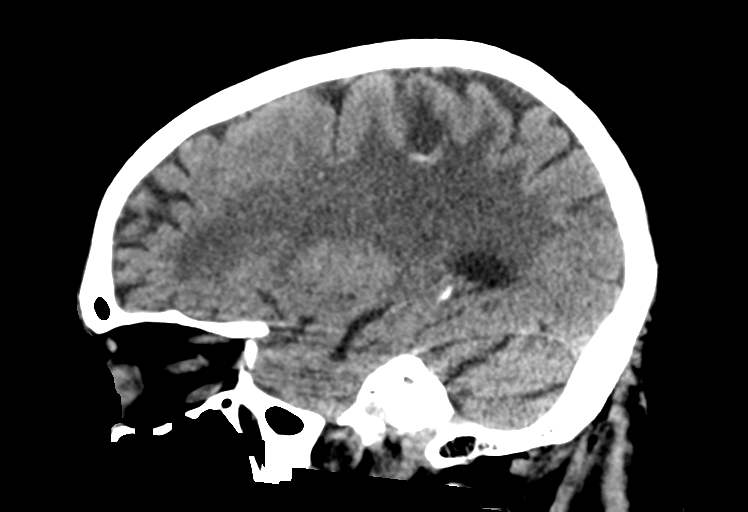
[im 30/60  brain]
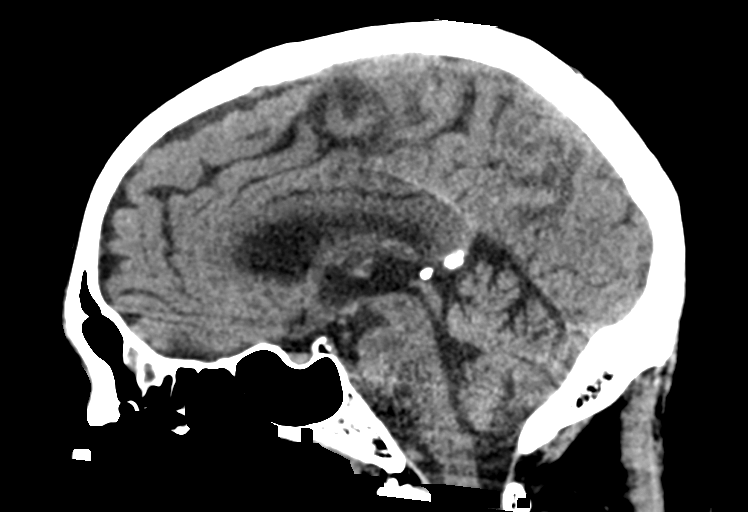
[im 40/60  brain]
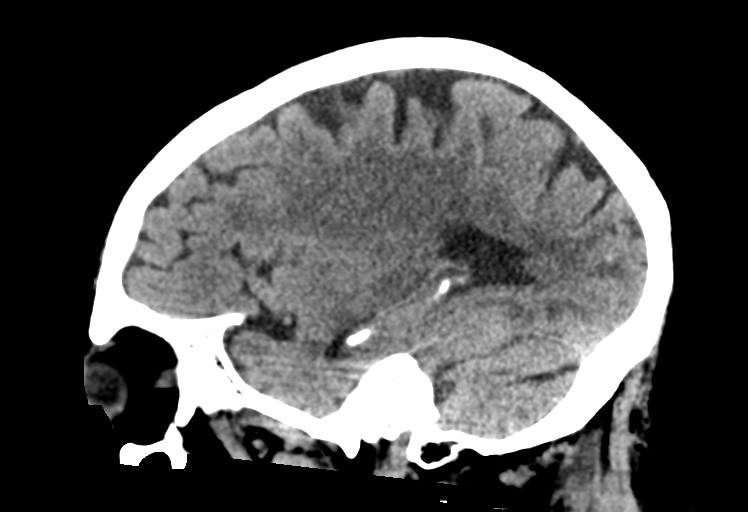

[14 of 47 positions shown; findings below may reference images not displayed]

FINDINGS: Brain: No evidence of new or progressive disease. Low-density
throughout the white matter consistent with post treatment change.
Some vasogenic edema at the right parietal vertex relates to a
treated lesion in that region. Treated lesions can be identified by
CT in the posterior right cerebellum, left occipital lobe, medial
right posterior parietal region, and right parietal vertex. None of
these appear to have enlarged. No evidence of hydrocephalus or
extra-axial collection.

Vascular: There is atherosclerotic calcification of the major
vessels at the base of the brain.

Skull: No calvarial lesion.

Sinuses/Orbits: No significant sinus inflammatory disease. Fluid in
the left middle ear and mastoid regions. Old right medial orbital
blowout fracture.

Other: None
IMPRESSION: No new, progressive or worsening findings. Treated lesions affecting
the cerebellum and cerebral hemispheres as above without evidence of
progression or change. Chronic white matter low density consistent
with post treatment change. Some vasogenic edema in the right
parietal region related to the treated lesion as seen previously.

## 2021-09-02 IMAGING — DX DG CHEST 1V PORT
1 series · 1 of 1 positions shown · non-contrast
Comparison: CT chest/abdomen/pelvis [DATE]. Prior chest
radiographs [DATE] and earlier.

CLINICAL DATA: Provided history: Confusion.  Generalized weakness.

EXAM:
PORTABLE CHEST 1 VIEW

[chest ap]
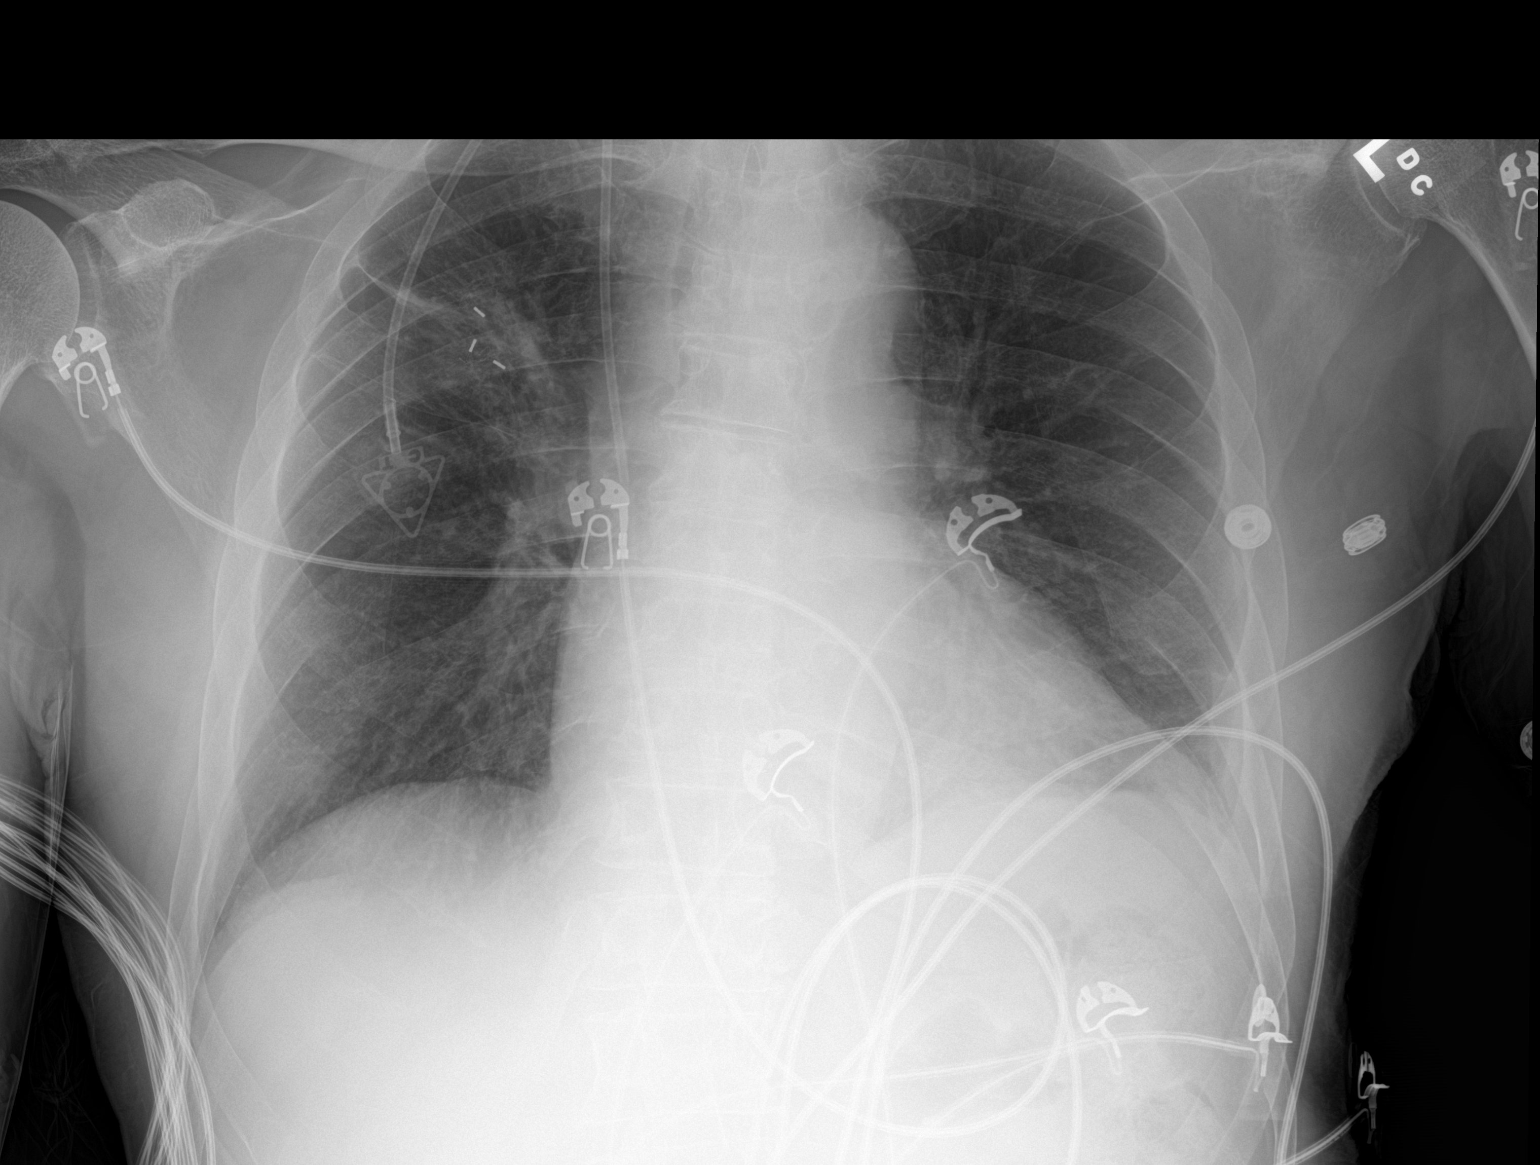

[1 of 1 positions shown; findings below may reference images not displayed]

FINDINGS: A right chest infusion port catheter is present with tip projecting
at the level of the superior cavoatrial junction. Heart size within
normal limits. Ill-defined opacity within the right upper lobe
corresponding with the post radiation changes described on the prior
chest CT of [DATE]. No appreciable airspace consolidation within
the left lung. No evidence of pleural effusion or pneumothorax. No
acute bony abnormality identified. Thoracic dextrocurvature.
IMPRESSION: No evidence of acute cardiopulmonary abnormality.

Ill-defined opacity within the right upper lobe corresponding with
the post radiation changes described on the prior chest CT of
[DATE].

## 2021-09-02 MED ORDER — SODIUM CHLORIDE 0.9 % IV SOLN
1.0000 g | INTRAVENOUS | Status: DC
  Administered 2021-09-03 – 2021-09-04 (×2): 1 g via INTRAVENOUS
  Filled 2021-09-02 (×3): qty 10

## 2021-09-02 MED ORDER — SODIUM CHLORIDE 0.9 % IV SOLN
1.0000 g | Freq: Once | INTRAVENOUS | Status: AC
  Administered 2021-09-02: 1 g via INTRAVENOUS
  Filled 2021-09-02: qty 10

## 2021-09-02 MED ORDER — ONDANSETRON HCL 4 MG PO TABS: 4.0000 mg | ORAL_TABLET | Freq: Four times a day (QID) | ORAL | Status: DC | PRN

## 2021-09-02 MED ORDER — ONDANSETRON HCL 4 MG/2ML IJ SOLN: 4.0000 mg | Freq: Four times a day (QID) | INTRAMUSCULAR | Status: DC | PRN

## 2021-09-02 MED ORDER — LEVETIRACETAM 500 MG PO TABS
1000.0000 mg | ORAL_TABLET | Freq: Two times a day (BID) | ORAL | Status: DC
  Administered 2021-09-02 – 2021-09-10 (×15): 1000 mg via ORAL
  Filled 2021-09-02 (×16): qty 2

## 2021-09-02 MED ORDER — AMLODIPINE BESYLATE 5 MG PO TABS
5.0000 mg | ORAL_TABLET | Freq: Every day | ORAL | Status: DC
  Administered 2021-09-02: 5 mg via ORAL
  Filled 2021-09-02: qty 1

## 2021-09-02 MED ORDER — ROSUVASTATIN CALCIUM 20 MG PO TABS
20.0000 mg | ORAL_TABLET | Freq: Every day | ORAL | Status: DC
Start: 2021-09-02 — End: 2021-09-10
  Administered 2021-09-02 – 2021-09-10 (×9): 20 mg via ORAL
  Filled 2021-09-02 (×9): qty 1

## 2021-09-02 MED ORDER — DEXAMETHASONE 4 MG PO TABS
4.0000 mg | ORAL_TABLET | Freq: Two times a day (BID) | ORAL | Status: DC
  Administered 2021-09-02 – 2021-09-10 (×16): 4 mg via ORAL
  Filled 2021-09-02 (×16): qty 1

## 2021-09-02 MED ORDER — GABAPENTIN 100 MG PO CAPS
100.0000 mg | ORAL_CAPSULE | Freq: Two times a day (BID) | ORAL | Status: DC
  Administered 2021-09-02 – 2021-09-10 (×16): 100 mg via ORAL
  Filled 2021-09-02 (×16): qty 1

## 2021-09-02 MED ORDER — POTASSIUM CHLORIDE CRYS ER 20 MEQ PO TBCR
40.0000 meq | EXTENDED_RELEASE_TABLET | Freq: Two times a day (BID) | ORAL | Status: AC
  Administered 2021-09-02 – 2021-09-03 (×3): 40 meq via ORAL
  Filled 2021-09-02 (×3): qty 2

## 2021-09-02 MED ORDER — ACETAMINOPHEN 325 MG PO TABS: 650.0000 mg | ORAL_TABLET | Freq: Four times a day (QID) | ORAL | Status: DC | PRN

## 2021-09-02 MED ORDER — SODIUM CHLORIDE 0.9 % IV BOLUS
1000.0000 mL | Freq: Once | INTRAVENOUS | Status: AC
  Administered 2021-09-02: 1000 mL via INTRAVENOUS

## 2021-09-02 MED ORDER — ACETAMINOPHEN 650 MG RE SUPP
650.0000 mg | Freq: Four times a day (QID) | RECTAL | Status: DC | PRN
Start: 2021-09-02 — End: 2021-09-10

## 2021-09-02 MED ORDER — FOLIC ACID 1 MG PO TABS
1.0000 mg | ORAL_TABLET | Freq: Every day | ORAL | Status: DC
  Administered 2021-09-02 – 2021-09-10 (×9): 1 mg via ORAL
  Filled 2021-09-02 (×9): qty 1

## 2021-09-02 MED ORDER — PANTOPRAZOLE SODIUM 40 MG PO TBEC
40.0000 mg | DELAYED_RELEASE_TABLET | Freq: Every day | ORAL | Status: DC
Start: 2021-09-03 — End: 2021-09-10
  Administered 2021-09-03 – 2021-09-10 (×8): 40 mg via ORAL
  Filled 2021-09-02 (×8): qty 1

## 2021-09-02 MED ORDER — LATANOPROST 0.005 % OP SOLN
1.0000 [drp] | Freq: Every day | OPHTHALMIC | Status: DC
  Administered 2021-09-02 – 2021-09-08 (×7): 1 [drp] via OPHTHALMIC
  Filled 2021-09-02: qty 2.5

## 2021-09-02 MED ORDER — INSULIN ASPART 100 UNIT/ML IJ SOLN
0.0000 [IU] | Freq: Three times a day (TID) | INTRAMUSCULAR | Status: DC
  Administered 2021-09-03: 3 [IU] via SUBCUTANEOUS
  Administered 2021-09-03 – 2021-09-04 (×3): 2 [IU] via SUBCUTANEOUS
  Administered 2021-09-04: 3 [IU] via SUBCUTANEOUS
  Administered 2021-09-04: 5 [IU] via SUBCUTANEOUS
  Administered 2021-09-05 (×3): 2 [IU] via SUBCUTANEOUS
  Administered 2021-09-06: 3 [IU] via SUBCUTANEOUS
  Administered 2021-09-06: 5 [IU] via SUBCUTANEOUS
  Administered 2021-09-07: 3 [IU] via SUBCUTANEOUS
  Administered 2021-09-07: 2 [IU] via SUBCUTANEOUS
  Administered 2021-09-08: 5 [IU] via SUBCUTANEOUS
  Administered 2021-09-08: 2 [IU] via SUBCUTANEOUS
  Administered 2021-09-08 – 2021-09-09 (×2): 5 [IU] via SUBCUTANEOUS
  Administered 2021-09-09 (×2): 3 [IU] via SUBCUTANEOUS
  Administered 2021-09-10: 2 [IU] via SUBCUTANEOUS

## 2021-09-02 MED ORDER — INSULIN ASPART 100 UNIT/ML IJ SOLN
0.0000 [IU] | Freq: Every day | INTRAMUSCULAR | Status: DC
  Administered 2021-09-08: 2 [IU] via SUBCUTANEOUS

## 2021-09-02 NOTE — ED Provider Notes (Signed)
?[facility] DEPT ?Provider Note ? ? ?CSN: [id number] ?Arrival date & time: 09/02/21  1008 ? ?  ? ?History ? ?Chief Complaint  ?Patient presents with  ? Weakness  ? ? ?Benjamin Willis. is a 76 y.o. male.  Level 5 caveat secondary to altered mental status.  He is brought in from home by ambulance for evaluation of general weakness.  He has a history of metastatic lung cancer and has been more weak since he received chemotherapy 3 days ago.  Per EMS family has noticed some foul-smelling urine.  He admits to a chronic cough.  Denies any fever.  States he has been eating and drinking well and is hungry.  Denies any abdominal pain or chest pain.  There was a minor fall this morning when the patient's legs would not support him and he slipped back into the bed. ? ?The history is provided by the patient and the EMS personnel.  ?Weakness ?Severity:  Moderate ?Onset quality:  Gradual ?Duration:  3 days ?Timing:  Constant ?Progression:  Worsening ?Chronicity:  New ?Relieved by:  Nothing ?Worsened by:  Activity ?Ineffective treatments:  Rest ?Associated symptoms: cough, difficulty walking, falls and foul-smelling urine   ?Associated symptoms: no abdominal pain, no chest pain, no fever, no loss of consciousness, no nausea, no shortness of breath and no vomiting   ? ?  ? ?Home Medications ?Prior to Admission medications   ?Medication Sig Start Date End Date Taking? Authorizing Provider  ?albuterol (VENTOLIN HFA) 108 (90 Base) MCG/ACT inhaler Inhale 2 puffs into the lungs every 6 (six) hours as needed for wheezing or shortness of breath. 01/19/19   Garner Nash, DO  ?amLODipine (NORVASC) 5 MG tablet Take 1 tablet (5 mg total) by mouth daily. 08/20/21 02/16/22  Horald Pollen, MD  ?blood glucose meter kit and supplies KIT Dispense based on patient and insurance preference. Use up to four times daily as directed. 10/23/20   Elodia Florence., MD  ?Blood Pressure Monitoring (BLOOD PRESSURE  MONITOR/ARM) DEVI Check BP once a day. DX I 10 04/18/19   Horald Pollen, MD  ?dexamethasone (DECADRON) 4 MG tablet Take 1 tablet (4 mg total) by mouth every 12 (twelve) hours. 07/15/21   Regalado, Belkys A, MD  ?diphenhydrAMINE-zinc acetate (BENADRYL) cream Apply topically 2 (two) times daily as needed for itching. 07/15/21   Regalado, Jerald Kief A, MD  ?empagliflozin (JARDIANCE) 10 MG TABS tablet Take 1 tablet (10 mg total) by mouth daily before breakfast. 08/20/21 02/16/22  Horald Pollen, MD  ?folic acid (FOLVITE) 1 MG tablet Take 1 tablet (1 mg total) by mouth daily. 05/01/21   Horald Pollen, MD  ?gabapentin (NEURONTIN) 100 MG capsule Take 1 capsule (100 mg total) by mouth 2 (two) times daily. 07/15/21   Regalado, Belkys A, MD  ?latanoprost (XALATAN) 0.005 % ophthalmic solution Place 1 drop into both eyes at bedtime. 01/24/21   Ailene Ards, NP  ?levETIRAcetam (KEPPRA) 1000 MG tablet Take 1 tablet by mouth twice daily ?Patient taking differently: Take 1,000 mg by mouth in the morning and at bedtime. 05/29/21   Horald Pollen, MD  ?metFORMIN (GLUCOPHAGE) 500 MG tablet TAKE 1 TABLET BY MOUTH TWICE DAILY WITH MEALS ?Patient taking differently: Take 500 mg by mouth 2 (two) times daily with a meal. 06/25/21   Sagardia, Ines Bloomer, MD  ?multivitamin (ONE-A-DAY MEN'S) TABS tablet Take 1 tablet by mouth daily with breakfast.    [provider]  ?pantoprazole (  PROTONIX) 40 MG tablet Take 1 tablet (40 mg total) by mouth daily. ?Patient taking differently: Take 40 mg by mouth daily before breakfast. 05/01/21   Horald Pollen, MD  ?rosuvastatin (CRESTOR) 20 MG tablet Take 20 mg by mouth daily.    [provider]  ?tirzepatide Darcel Bayley) 5 MG/0.5ML Pen Inject 5 mg into the skin once a week. 08/20/21   Horald Pollen, MD  ?   ? ?Allergies    ?Patient has no known allergies.   ? ?Review of Systems   ?Review of Systems  ?Unable to perform ROS: Mental status change   ?Constitutional:  Negative for fever.  ?Respiratory:  Positive for cough. Negative for shortness of breath.   ?Cardiovascular:  Negative for chest pain.  ?Gastrointestinal:  Negative for abdominal pain, nausea and vomiting.  ?Musculoskeletal:  Positive for falls.  ?Neurological:  Positive for weakness. Negative for loss of consciousness.  ? ?Physical Exam ?Updated Vital Signs ?BP (!) 152/91 (BP Location: Right Arm)   Pulse 89   Temp 97.6 ?F (36.4 ?C) (Oral)   Resp 12   SpO2 99%  ?Physical Exam ?Vitals and nursing note reviewed.  ?Constitutional:   ?   General: He is not in acute distress. ?   Appearance: Normal appearance. He is well-developed.  ?HENT:  ?   Head: Normocephalic and atraumatic.  ?Eyes:  ?   Conjunctiva/sclera: Conjunctivae normal.  ?Cardiovascular:  ?   Rate and Rhythm: Normal rate. Rhythm irregular.  ?   Heart sounds: No murmur heard. ?Pulmonary:  ?   Effort: Pulmonary effort is normal. No respiratory distress.  ?   Breath sounds: Normal breath sounds.  ?Abdominal:  ?   Palpations: Abdomen is soft.  ?   Tenderness: There is no abdominal tenderness. There is no guarding or rebound.  ?Musculoskeletal:     ?   General: No tenderness or deformity.  ?   Cervical back: Neck supple.  ?Skin: ?   General: Skin is warm and dry.  ?   Capillary Refill: Capillary refill takes less than 2 seconds.  ?Neurological:  ?   General: No focal deficit present.  ?   Mental Status: He is alert. He is disoriented.  ?   Comments: Patient is awake and alert.  He is following commands moving all extremities.  ? ? ?ED Results / Procedures / Treatments   ?Labs ?(all labs ordered are listed, but only abnormal results are displayed) ?Labs Reviewed  ?BASIC METABOLIC PANEL - Abnormal; Notable for the following components:  ?    Result Value  ? Potassium 3.2 (*)   ? CO2 20 (*)   ? Glucose, Bld 121 (*)   ? Calcium 8.8 (*)   ? All other components within normal limits  ?CBC - Abnormal; Notable for the following components:  ? RBC  3.83 (*)   ? Hemoglobin 12.6 (*)   ? MCV 102.9 (*)   ? nRBC 0.3 (*)   ? All other components within normal limits  ?URINALYSIS, ROUTINE W REFLEX MICROSCOPIC - Abnormal; Notable for the following components:  ? APPearance CLOUDY (*)   ? Glucose, UA >=500 (*)   ? Hgb urine dipstick SMALL (*)   ? Ketones, ur 80 (*)   ? Protein, ur 100 (*)   ? Leukocytes,Ua TRACE (*)   ? Bacteria, UA RARE (*)   ? All other components within normal limits  ?CBG MONITORING, ED - Abnormal; Notable for the following components:  ? Glucose-Capillary 125 (*)   ?  All other components within normal limits  ?GLUCOSE, CAPILLARY  ?COMPREHENSIVE METABOLIC PANEL  ?CBC  ? ? ?EKG ?EKG Interpretation ? ?Date/Time:  Monday Sep 02 2021 10:15:26 EDT ?Ventricular Rate:  85 ?PR Interval:  133 ?QRS Duration: 90 ?QT Interval:  364 ?QTC Calculation: 433 ?R Axis:   -26 ?Text Interpretation: Sinus rhythm Multiple premature complexes, vent & supraven Probable left atrial enlargement Borderline left axis deviation Anterior infarct, old No significant change since prior 2/23 Confirmed by Aletta Edouard [phone]) on 09/02/2021 10:39:09 AM ? ?Radiology ?CT Head Wo Contrast ? ?Result Date: 09/02/2021 ?CLINICAL DATA:  Generalized weakness. Mental status change. 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 EXAM: CT HEAD WITHOUT CONTRAST TECHNIQUE: Contiguous axial images were obtained from the base of the skull through the vertex without intravenous contrast. RADIATION DOSE REDUCTION: This exam was performed according to the departmental dose-optimization program which includes automated exposure control, adjustment of the mA and/or kV according to patient size and/or use of iterative reconstruction technique. COMPARISON:  CT 07/02/2021.  MRI 08/28/2021. FINDINGS: Brain: No evidence of new or progressive disease. Low-density throughout the white matter consistent with post treatment change. Some vasogenic edema at the right parietal vertex relates to a  treated lesion in that region. Treated lesions can be identified by CT in the posterior right cerebellum, left occipital lobe, medial right posterior parietal region, and right parietal vertex. None of these appear to have

## 2021-09-02 NOTE — H&P (Signed)
?History and Physical  ? ? ?Patient: Benjamin Willis. GEZ:[id number] DOB: 1945-11-11 ?DOA: 09/02/2021 ?DOS: the patient was seen and examined on 09/02/2021 ?PCP: Horald Pollen, MD  ?Patient coming from: Home ? ?Chief Complaint:  ?Chief Complaint  ?Patient presents with  ? Weakness  ? ?HPI: Benjamin Willis. is a 76 y.o. male with medical history significant of DM2, HTN, HLD, NSCLC. Presenting with weakness. History is from sister at bedside. She reports that the patient has had increased urinary frequency and incontinence over the last week. He has seemed progressively weaker during this time. He had a chemo session 3 days ago. Since that time, his appetite has been poor. This morning she found the patient on the floor next to his bed. He reports that he was trying to get some underwear and he fell. He didn't hit his head or suffer LOC. He was unable to get up on his own. So his sister called EMS for help. They deny any other aggravating or alleviating factors.  ? ?Review of Systems: As mentioned in the history of present illness. All other systems reviewed and are negative. ?Past Medical History:  ?Diagnosis Date  ? Allergy   ? Diabetes mellitus without complication ([place])   ? Type II  ? Glaucoma 10/16/2020  ? History of radiation therapy   ? Thorax- SBRT 02/21/19-02/28/19, Rt Lung- IMRT 02/29/20-04/16/20 Dr. Gery Pray  ? History of radiation therapy 10/30/2020  ? whole brain 10/17/2020-10/30/2020 Dr Gery Pray  ? Hyperlipidemia   ? Hypertension   ? Lung cancer ([place])   ? Stage IA2 (cT1b, N0) non-small cell lung cancer (adenocarcinoma)  ? ?Past Surgical History:  ?Procedure Laterality Date  ? ELECTROMAGNETIC NAVIGATION BROCHOSCOPY Right 02/02/2019  ? Procedure: VIDEO BRONCHOSCOPY WITH NAVIGATION AND FIDUCIAL PLACEMENT;  Surgeon: Garner Nash, DO;  Location: [place];  Service: Cardiopulmonary;  Laterality: Right;  ? FUDUCIAL PLACEMENT Right 02/02/2019  ? Procedure: Placement Of Fuducial Right Upper Lobe;   Surgeon: Garner Nash, DO;  Location: [place];  Service: Cardiopulmonary;  Laterality: Right;  ? IR IMAGING GUIDED PORT INSERTION  12/13/2020  ? none    ? VIDEO BRONCHOSCOPY WITH RADIAL ENDOBRONCHIAL ULTRASOUND Right 02/02/2019  ? Procedure: Video Bronchoscopy With Radial Endobronchial Ultrasound;  Surgeon: Garner Nash, DO;  Location: [place];  Service: Cardiopulmonary;  Laterality: Right;  ? ?Social History:  reports that he quit smoking about 6 years ago. His smoking use included cigarettes. He has a 48.00 pack-year smoking history. He quit smokeless tobacco use about 7 years ago. He reports that he does not drink alcohol and does not use drugs. ? ?No Known Allergies ? ?Family History  ?Problem Relation Age of Onset  ? Lung cancer Neg Hx   ? ? ?Prior to Admission medications   ?Medication Sig Start Date End Date Taking? Authorizing Provider  ?albuterol (VENTOLIN HFA) 108 (90 Base) MCG/ACT inhaler Inhale 2 puffs into the lungs every 6 (six) hours as needed for wheezing or shortness of breath. 01/19/19   Garner Nash, DO  ?amLODipine (NORVASC) 5 MG tablet Take 1 tablet (5 mg total) by mouth daily. 08/20/21 02/16/22  Horald Pollen, MD  ?blood glucose meter kit and supplies KIT Dispense based on patient and insurance preference. Use up to four times daily as directed. 10/23/20   Elodia Florence., MD  ?Blood Pressure Monitoring (BLOOD PRESSURE MONITOR/ARM) DEVI Check BP once a day. DX I 10 04/18/19   Horald Pollen, MD  ?dexamethasone (  DECADRON) 4 MG tablet Take 1 tablet (4 mg total) by mouth every 12 (twelve) hours. 07/15/21   Regalado, Belkys A, MD  ?diphenhydrAMINE-zinc acetate (BENADRYL) cream Apply topically 2 (two) times daily as needed for itching. 07/15/21   Regalado, Jerald Kief A, MD  ?empagliflozin (JARDIANCE) 10 MG TABS tablet Take 1 tablet (10 mg total) by mouth daily before breakfast. 08/20/21 02/16/22  Horald Pollen, MD  ?folic acid (FOLVITE) 1 MG tablet Take 1 tablet (1 mg  total) by mouth daily. 05/01/21   Horald Pollen, MD  ?gabapentin (NEURONTIN) 100 MG capsule Take 1 capsule (100 mg total) by mouth 2 (two) times daily. 07/15/21   Regalado, Belkys A, MD  ?latanoprost (XALATAN) 0.005 % ophthalmic solution Place 1 drop into both eyes at bedtime. 01/24/21   Ailene Ards, NP  ?levETIRAcetam (KEPPRA) 1000 MG tablet Take 1 tablet by mouth twice daily ?Patient taking differently: Take 1,000 mg by mouth in the morning and at bedtime. 05/29/21   Horald Pollen, MD  ?metFORMIN (GLUCOPHAGE) 500 MG tablet TAKE 1 TABLET BY MOUTH TWICE DAILY WITH MEALS ?Patient taking differently: Take 500 mg by mouth 2 (two) times daily with a meal. 06/25/21   Sagardia, Ines Bloomer, MD  ?multivitamin (ONE-A-DAY MEN'S) TABS tablet Take 1 tablet by mouth daily with breakfast.    [provider]  ?pantoprazole (PROTONIX) 40 MG tablet Take 1 tablet (40 mg total) by mouth daily. ?Patient taking differently: Take 40 mg by mouth daily before breakfast. 05/01/21   Horald Pollen, MD  ?rosuvastatin (CRESTOR) 20 MG tablet Take 20 mg by mouth daily.    [provider]  ?tirzepatide Darcel Bayley) 5 MG/0.5ML Pen Inject 5 mg into the skin once a week. 08/20/21   Horald Pollen, MD  ? ? ?Physical Exam: ?Vitals:  ? 09/02/21 1017 09/02/21 1100 09/02/21 1200 09/02/21 1300  ?BP: (!) 152/91 130/82 137/88 (!) 144/83  ?Pulse: 89 88 81 83  ?Resp: $Remov'12 11 17 19  'KWyaXJ$ ?Temp: 97.6 ?F (36.4 ?C)     ?TempSrc: Oral     ?SpO2: 99% 97% 96% 98%  ? ?General: 76 y.o. male resting in bed in NAD ?Eyes: PERRL, normal sclera ?ENMT: Nares patent w/o discharge, orophaynx clear, dentition normal, ears w/o discharge/lesions/ulcers ?Neck: Supple, trachea midline ?Cardiovascular: RRR, +S1, S2, no m/g/r, equal pulses throughout ?Respiratory: CTABL, no w/r/r, normal WOB ?GI: BS+, NDNT, no masses noted, no organomegaly noted ?MSK: No e/c/c ?Neuro: A&O x 3, no focal deficits ?Psyc: Slow responses and flat affect,  calm/cooperative ? ?Data Reviewed: ? ?K+ 3.2 ?CO2  20 ?Glucose 121 ?WBC  9.5 ?Hgb  12.6 ? ?CTH: No new, progressive or worsening findings. Treated lesions affecting the cerebellum and cerebral hemispheres as above without evidence of progression or change. Chronic white matter low density consistent with post treatment change. Some vasogenic edema in the right parietal region related to the treated lesion as seen previously. ? ?CXR: No evidence of acute cardiopulmonary abnormality. ?Ill-defined opacity within the right upper lobe corresponding with the post radiation changes described on the prior chest CT of 06/17/2021. ? ?Assessment and Plan: ?No notes have been filed under this hospital service. ?Service: Hospitalist ?UTI ?    - admit to inpt, tele ?    - continue rocephin, follow UCx, Bld Cx ?    - fluids ? ?Falls ?Generalized weakness ?    - PT/OT ?    - CTH as above ?    - treat condition above ? ?  NSCLC w/ brain mets ?    - stable CTH ?    - continue home regimen when confirmed ?    - continue outpt follow up with onco ? ?DM2 ?    - SSI, glucose checks, DM diet ? ?HTN ?    - continue home regimen when confirmed ? ?HLD ?    - continue home regimen when confirmed ? ?Advance Care Planning:   Code Status: FULL ? ?Consults: None ? ?Family Communication: w/ sister at bedside ? ?Severity of Illness: ?The appropriate patient status for this patient is INPATIENT. Inpatient status is judged to be reasonable and necessary in order to provide the required intensity of service to ensure the patient's safety. The patient's presenting symptoms, physical exam findings, and initial radiographic and laboratory data in the context of their chronic comorbidities is felt to place them at high risk for further clinical deterioration. Furthermore, it is not anticipated that the patient will be medically stable for discharge from the hospital within 2 midnights of admission.  ? ?* I certify that at the point of admission it is my  clinical judgment that the patient will require inpatient hospital care spanning beyond 2 midnights from the point of admission due to high intensity of service, high risk for further deterioration and high frequency of survei

## 2021-09-02 NOTE — ED Triage Notes (Signed)
Pt bib [facility] from home d/t generalized weakness that began after his chemo on Friday. Family reports intermittent confusion since June of 2022. Family also reports frequent foul smelling urination.  ?

## 2021-09-03 DIAGNOSIS — N3000 Acute cystitis without hematuria: Secondary | ICD-10-CM | POA: Diagnosis not present

## 2021-09-03 LAB — GLUCOSE, CAPILLARY
Glucose-Capillary: 147 mg/dL — ABNORMAL HIGH (ref 70–99)
Glucose-Capillary: 173 mg/dL — ABNORMAL HIGH (ref 70–99)
Glucose-Capillary: 129 mg/dL — ABNORMAL HIGH (ref 70–99)
Glucose-Capillary: 173 mg/dL — ABNORMAL HIGH (ref 70–99)

## 2021-09-03 LAB — COMPREHENSIVE METABOLIC PANEL
ALT: 13 U/L (ref 0–44)
AST: 27 U/L (ref 15–41)
Albumin: 3.1 g/dL — ABNORMAL LOW (ref 3.5–5.0)
Alkaline Phosphatase: 72 U/L (ref 38–126)
Anion gap: 15 (ref 5–15)
BUN: 14 mg/dL (ref 8–23)
CO2: 19 mmol/L — ABNORMAL LOW (ref 22–32)
Calcium: 8.9 mg/dL (ref 8.9–10.3)
Chloride: 111 mmol/L (ref 98–111)
Creatinine, Ser: 1 mg/dL (ref 0.61–1.24)
GFR, Estimated: >60 mL/min (ref >60)
Glucose, Bld: 148 mg/dL — ABNORMAL HIGH (ref 70–99)
Potassium: 4.4 mmol/L (ref 3.5–5.1)
Sodium: 145 mmol/L (ref 135–145)
Total Bilirubin: 1.4 mg/dL — ABNORMAL HIGH (ref 0.3–1.2)
Total Protein: 6 g/dL — ABNORMAL LOW (ref 6.5–8.1)

## 2021-09-03 LAB — CBC
HCT: 36.9 % — ABNORMAL LOW (ref 39.0–52.0)
Hemoglobin: 12.3 g/dL — ABNORMAL LOW (ref 13.0–17.0)
MCH: 33.4 pg (ref 26.0–34.0)
MCHC: 33.3 g/dL (ref 30.0–36.0)
MCV: 100.3 fL — ABNORMAL HIGH (ref 80.0–100.0)
Platelets: 244 10*3/uL (ref 150–400)
RBC: 3.68 MIL/uL — ABNORMAL LOW (ref 4.22–5.81)
RDW: 14.5 % (ref 11.5–15.5)
WBC: 8.5 10*3/uL (ref 4.0–10.5)
nRBC: 0.4 % — ABNORMAL HIGH (ref 0.0–0.2)

## 2021-09-03 MED ORDER — METOPROLOL TARTRATE 25 MG PO TABS
25.0000 mg | ORAL_TABLET | Freq: Two times a day (BID) | ORAL | Status: DC
  Administered 2021-09-03 – 2021-09-10 (×15): 25 mg via ORAL
  Filled 2021-09-03 (×15): qty 1

## 2021-09-03 MED ORDER — SODIUM CHLORIDE 0.9 % IV SOLN
INTRAVENOUS | Status: DC
Start: 2021-09-03 — End: 2021-09-06

## 2021-09-03 NOTE — Evaluation (Addendum)
Physical Therapy Evaluation ?Patient Details ?Name: Benjamin Willis. ?MRN: [id number] ?DOB: 07-29-1945 ?Today's Date: 09/03/2021 ? ?History of Present Illness ? 76 y.o. male presenting with  weakness, fall, admitted wtih UTI. CTH: No new, progressive or worsening findings. Treated lesions affecting the cerebellum and cerebral hemispheres as above without evidence of progression or change. PMH: DM2, HTN, HLD, NSCLC with brain mets, whole brain XRT.  ?Clinical Impression ? Pt admitted with above diagnosis.  ?Pt much weaker today than at his baseline (per review of previous PT notes and pt report). Pt will likely need SNF given pt requiring mod assist and has had 2 recent admissions, unless family able to provide necessary assist. No family present at time of eval.  ? Pt currently with functional limitations due to the deficits listed below (see PT Problem List). Pt will benefit from skilled PT to increase their independence and safety with mobility to allow discharge to the venue listed below.   ?   ?   ? ?Recommendations for follow up therapy are one component of a multi-disciplinary discharge planning process, led by the attending physician.  Recommendations may be updated based on patient status, additional functional criteria and insurance authorization. ? ?Follow Up Recommendations Skilled nursing-short term rehab (<3 hours/day) ? ?  ?Assistance Recommended at Discharge    ?Patient can return home with the following ? Help with stairs or ramp for entrance;A lot of help with walking and/or transfers;A lot of help with bathing/dressing/bathroom;Assist for transportation;Direct supervision/assist for financial management;Assistance with cooking/housework;Direct supervision/assist for medications management ? ?  ?Equipment Recommendations None recommended by PT  ?Recommendations for Other Services ?    ?  ?Functional Status Assessment Patient has had a recent decline in their functional status and demonstrates the  ability to make significant improvements in function in a reasonable and predictable amount of time.  ? ?  ?Precautions / Restrictions Precautions ?Precautions: Fall ?Restrictions ?Weight Bearing Restrictions: No  ? ?  ? ?Mobility ? Bed Mobility ?Overal bed mobility: Needs Assistance ?Bed Mobility: Supine to Sit, Sit to Supine ?  ?  ?Supine to sit: Mod assist, HOB elevated ?Sit to supine: Mod assist ?  ?General bed mobility comments: assist to elevate trunk, assist to control trunk descent and lift LEs on to bed. incr time ?  ? ?Transfers ?Overall transfer level: Needs assistance ?Equipment used: Rolling walker (2 wheels) ?Transfers: Sit to/from Stand ?Sit to Stand: Mod assist ?  ?  ?  ?  ?  ?General transfer comment: assist to rise and transition to RW, unable to maintain standing >30 seconds d/t fatigue. unablt to side step along EOB. HR 101-106 ?  ? ?Ambulation/Gait ?  ?  ?  ?  ?  ?  ?  ?General Gait Details: unable at this time ? ?Stairs ?  ?  ?  ?  ?  ? ?Wheelchair Mobility ?  ? ?Modified Rankin (Stroke Patients Only) ?  ? ?  ? ?Balance Overall balance assessment: Needs assistance, History of Falls ?Sitting-balance support: Feet supported, No upper extremity supported, Feet unsupported ?Sitting balance-Leahy Scale: Fair ?  ?  ?Standing balance support: During functional activity ?Standing balance-Leahy Scale: Poor ?  ?  ?  ?  ?  ?  ?  ?  ?  ?  ?  ?  ?   ? ? ? ?Pertinent Vitals/Pain Pain Assessment ?Pain Assessment: No/denies pain  ? ? ?Home Living Family/patient expects to be discharged to:: Private residence ?  ?Available Help at  Discharge: Family;Available 24 hours/day ?Type of Home: House ?Home Access: Stairs to enter ?Entrance Stairs-Rails: Can reach both;Left;Right ?Entrance Stairs-Number of Steps: 3 ?  ?Home Layout: Two level;Able to live on main level with bedroom/bathroom ?Home Equipment: Rolling Walker (2 wheels);BSC/3in1;Grab bars - tub/shower;Cane - single point;Shower seat;Hand held shower  head ?Additional Comments: some infor taken from previous admission ~ 2 most ago.  Family not present at time of eval, pt reports he is in agreement to go stay with his sister while he regains his strength and independence, pt reports sister can care for him--? unsure if this remains accurate  ?  ?Prior Function   ?  ?  ?  ?  ?  ?  ?Mobility Comments: walked with RW without assistance ?  ?  ? ? ?Hand Dominance  ?   ? ?  ?Extremity/Trunk Assessment  ? Upper Extremity Assessment ?Upper Extremity Assessment: Defer to OT evaluation ?  ? ?Lower Extremity Assessment ?Lower Extremity Assessment: Generalized weakness ?  ? ?   ?Communication  ?    ?Cognition Arousal/Alertness: Awake/alert (sleepy) ?Behavior During Therapy: Flat affect ?Overall Cognitive Status: No family/caregiver present to determine baseline cognitive functioning ?Area of Impairment: Following commands ?  ?  ?  ?  ?  ?  ?  ?  ?  ?  ?  ?Following Commands: Follows one step commands with increased time, Follows multi-step commands inconsistently ?  ?  ?  ?  ?  ?  ? ?  ?General Comments   ? ?  ?Exercises    ? ?Assessment/Plan  ?  ?PT Assessment Patient needs continued PT services  ?PT Problem List Decreased strength;Decreased mobility;Decreased activity tolerance;Decreased balance;Decreased knowledge of use of DME ? ?   ?  ?PT Treatment Interventions DME instruction;Therapeutic exercise;Gait training;Balance training;Functional mobility training;Therapeutic activities;Patient/family education   ? ?PT Goals (Current goals can be found in the Care Plan section)  ?Acute Rehab PT Goals ?Patient Stated Goal: none stated ?PT Goal Formulation: Patient unable to participate in goal setting ?Time For Goal Achievement: 09/17/21 ?Potential to Achieve Goals: Good ? ?  ?Frequency Min 2X/week ?  ? ? ?Co-evaluation   ?  ?  ?  ?  ? ? ?  ?AM-PAC PT "6 Clicks" Mobility  ?Outcome Measure Help needed turning from your back to your side while in a flat bed without using  bedrails?: A Lot ?Help needed moving from lying on your back to sitting on the side of a flat bed without using bedrails?: A Lot ?Help needed moving to and from a bed to a chair (including a wheelchair)?: A Lot ?Help needed standing up from a chair using your arms (e.g., wheelchair or bedside chair)?: Total ?Help needed to walk in hospital room?: Total ?Help needed climbing 3-5 steps with a railing? : Total ?6 Click Score: 9 ? ?  ?End of Session   ?Activity Tolerance: Patient limited by fatigue ?Patient left: in bed;with call bell/phone within reach;with bed alarm set ?  ?PT Visit Diagnosis: Other abnormalities of gait and mobility (R26.89);Difficulty in walking, not elsewhere classified (R26.2) ?  ? ?Time: 2423-5361 ?PT Time Calculation (min) (ACUTE ONLY): 11 min ? ? ?Charges:   PT Evaluation ?$PT Eval Low Complexity: 1 Low ?  ?  ?   ? ? ?Baxter Flattery, PT ? ?Acute Rehab Dept [facility]) [phone] ?Pager [phone] ? ?09/03/2021 ? ? ?Ranita Stjulien ?09/03/2021, 3:59 PM ? ?

## 2021-09-03 NOTE — Progress Notes (Signed)
OT Cancellation Note ? ?Patient Details ?Name: Benjamin Willis. ?MRN: [id number] ?DOB: 1945/12/30 ? ? ?Cancelled Treatment:    Reason Eval/Treat Not Completed: Medical issues which prohibited therapy ?Patients HR was noted to be 120 bpm upon entrance to room with HR increase to 124 bpm with talking with therapist and attempting to take sips from cup. Nurse made aware. OT to continue to follow and check back as schedule will allow.  ?Sheyann Sulton OTR/L, MS ?Acute Rehabilitation Department ?Office# [phone] ?Pager# [phone] ? ? ?09/03/2021, 9:05 AM ?

## 2021-09-03 NOTE — Progress Notes (Signed)
?PROGRESS NOTE ? ? ? ?Benjamin Willis.  HDQ:[id number] DOB: 11-04-1945 DOA: 09/02/2021 ?PCP: Horald Pollen, MD ? ? ?Brief Narrative:  ?HPI: Benjamin Willis. is a 76 y.o. male with medical history significant of DM2, HTN, HLD, NSCLC. Presenting with weakness. History is from sister at bedside. She reports that the patient has had increased urinary frequency and incontinence over the last week. He has seemed progressively weaker during this time. He had a chemo session 3 days ago. Since that time, his appetite has been poor. This morning she found the patient on the floor next to his bed. He reports that he was trying to get some underwear and he fell. He didn't hit his head or suffer LOC. He was unable to get up on his own. So his sister called EMS for help. They deny any other aggravating or alleviating factors.  ?  ? ?Assessment & Plan: ?  ?Principal Problem: ?  UTI (urinary tract infection) ?Active Problems: ?  Non-small cell lung cancer metastatic to brain [facility]) ?  Fall at home, initial encounter ?  Dyslipidemia associated with type 2 diabetes mellitus ([place]) ?  Hypertension associated with type 2 diabetes mellitus ([place]) ?  Diabetes mellitus type 2 in nonobese [facility]) ?  Weakness ? ?UTI: Continue Rocephin and follow culture. ? ?Falls secondary to generalized weakness: PT OT on board.  CT head negative. ? ?NSCLC w/ brain mets ?    - stable CTH ?    - continue home regimen when confirmed ?    - continue outpt follow up with onco.  Continue Keppra. ?  ?DM2: On Jardiance and metformin at home.  Blood sugar controlled on SSI. ?  ?HTN/sinus tachycardia: Takes amlodipine at home.  Blood pressure low normal.  Amlodipine discontinued and instead started on Lopressor 25 mg twice daily to control tachycardia and prevent lysing of the blood pressure. ?   ?HLD: Resume rosuvastatin. ? ?DVT prophylaxis: SCDs Start: 09/02/21 1527 ?  Code Status: Full Code  ?Family Communication:  None present at bedside.  Plan of care  discussed with patient in length and he/she verbalized understanding and agreed with it. ? ?Status is: Inpatient ?Remains inpatient appropriate because: Needs treatment for UTI and with degree of weakness, may need to go to SNF once assessed by PT OT. ? ? ?Estimated body mass index is 22.13 kg/m? as calculated from the following: ?  Height as of 08/20/21: 5\' 6"  (1.676 m). ?  Weight as of 08/30/21: 62.2 kg. ? ?  ?Nutritional Assessment: ?There is no height or weight on file to calculate BMI.Marland Kitchen ?Seen by dietician.  I agree with the assessment and plan as outlined below: ?Nutrition Status: ?  ?  ?  ? ?. ?Skin Assessment: ?I have examined the patient's skin and I agree with the wound assessment as performed by the wound care RN as outlined below: ?  ? ?Consultants:  ?None ? ?Procedures:  ?None ? ?Antimicrobials:  ?Anti-infectives (From admission, onward)  ? ? Start     Dose/Rate Route Frequency Ordered Stop  ? 09/03/21 1000  cefTRIAXone (ROCEPHIN) 1 g in sodium chloride 0.9 % 100 mL IVPB       ? 1 g ?200 mL/hr over 30 Minutes Intravenous Every 24 hours 09/02/21 1433    ? 09/02/21 1230  cefTRIAXone (ROCEPHIN) 1 g in sodium chloride 0.9 % 100 mL IVPB       ? 1 g ?200 mL/hr over 30 Minutes Intravenous  Once 09/02/21 1224 09/02/21  1407  ? ?  ?  ? ? ?Subjective: ?Seen and examined.  Patient fully alert and oriented.  Complains of significant weakness.  He does appear to be very weak as well.  No other complaint. ? ?Objective: ?Vitals:  ? 09/02/21 1944 09/02/21 2318 09/03/21 0452 09/03/21 1352  ?BP: (!) 141/80 123/79 127/83 121/88  ?Pulse: 100 (!) 105 (!) 110 98  ?Resp: 18 18 18 18   ?Temp: 98 ?F (36.7 ?C) 97.6 ?F (36.4 ?C) 98.1 ?F (36.7 ?C) 98 ?F (36.7 ?C)  ?TempSrc: Oral Oral Oral Oral  ?SpO2: 100% 100% 100% 98%  ? ? ?Intake/Output Summary (Last 24 hours) at 09/03/2021 1420 ?Last data filed at 09/03/2021 1353 ?Gross per 24 hour  ?Intake 420 ml  ?Output 550 ml  ?Net -130 ml  ? ?There were no vitals filed for this  visit. ? ?Examination: ? ?General exam: Appears calm and comfortable  ?Respiratory system: Clear to auscultation. Respiratory effort normal. ?Cardiovascular system: S1 & S2 heard, RRR. No JVD, murmurs, rubs, gallops or clicks. No pedal edema. ?Gastrointestinal system: Abdomen is nondistended, soft and nontender. No organomegaly or masses felt. Normal bowel sounds heard. ?Central nervous system: Alert and oriented. No focal neurological deficits. ?Extremities: Symmetric 5 x 5 power. ?Skin: No rashes, lesions or ulcers ?Psychiatry: Judgement and insight appear normal. Mood & affect appropriate.  ? ? ?Data Reviewed: I have personally reviewed following labs and imaging studies ? ?CBC: ?Recent Labs  ?Lab 08/30/21 ?1333 09/02/21 ?1031 09/03/21 ?0017  ?WBC 9.1 9.5 8.5  ?NEUTROABS 7.3  --   --   ?HGB 12.7* 12.6* 12.3*  ?HCT 38.9* 39.4 36.9*  ?MCV 98.2 102.9* 100.3*  ?PLT 309 235 244  ? ?Basic Metabolic Panel: ?Recent Labs  ?Lab 08/30/21 ?1333 09/02/21 ?1031 09/03/21 ?4944  ?NA 144 140 145  ?K 3.5 3.2* 4.4  ?CL 106 105 111  ?CO2 27 20* 19*  ?GLUCOSE 283* 121* 148*  ?BUN 19 21 14   ?CREATININE 1.21 1.06 1.00  ?CALCIUM 9.5 8.8* 8.9  ? ?GFR: ?Estimated Creatinine Clearance: 57 mL/min (by C-G formula based on SCr of 1 mg/dL). ?Liver Function Tests: ?Recent Labs  ?Lab 08/30/21 ?1333 09/03/21 ?9675  ?AST 16 27  ?ALT 15 13  ?ALKPHOS 84 72  ?BILITOT 0.4 1.4*  ?PROT 6.6 6.0*  ?ALBUMIN 3.9 3.1*  ? ?No results for input(s): LIPASE, AMYLASE in the last 168 hours. ?No results for input(s): AMMONIA in the last 168 hours. ?Coagulation Profile: ?No results for input(s): INR, PROTIME in the last 168 hours. ?Cardiac Enzymes: ?No results for input(s): CKTOTAL, CKMB, CKMBINDEX, TROPONINI in the last 168 hours. ?BNP (last 3 results) ?No results for input(s): PROBNP in the last 8760 hours. ?HbA1C: ?No results for input(s): HGBA1C in the last 72 hours. ?CBG: ?Recent Labs  ?Lab 09/02/21 ?1037 09/02/21 ?1549 09/02/21 ?2131 09/03/21 ?0739  09/03/21 ?1151  ?GLUCAP 125* 98 147* 147* 173*  ? ?Lipid Profile: ?No results for input(s): CHOL, HDL, LDLCALC, TRIG, CHOLHDL, LDLDIRECT in the last 72 hours. ?Thyroid Function Tests: ?No results for input(s): TSH, T4TOTAL, FREET4, T3FREE, THYROIDAB in the last 72 hours. ?Anemia Panel: ?No results for input(s): VITAMINB12, FOLATE, FERRITIN, TIBC, IRON, RETICCTPCT in the last 72 hours. ?Sepsis Labs: ?No results for input(s): PROCALCITON, LATICACIDVEN in the last 168 hours. ? ?No results found for this or any previous visit (from the past 240 hour(s)).  ? ?Radiology Studies: ?CT Head Wo Contrast ? ?Result Date: 09/02/2021 ?CLINICAL DATA:  Generalized weakness. Mental status change. Chemotherapy and radiation  therapy for metastatic lung cancer. EXAM: CT HEAD WITHOUT CONTRAST TECHNIQUE: Contiguous axial images were obtained from the base of the skull through the vertex without intravenous contrast. RADIATION DOSE REDUCTION: This exam was performed according to the departmental dose-optimization program which includes automated exposure control, adjustment of the mA and/or kV according to patient size and/or use of iterative reconstruction technique. COMPARISON:  CT 07/02/2021.  MRI 08/28/2021. FINDINGS: Brain: No evidence of new or progressive disease. Low-density throughout the white matter consistent with post treatment change. Some vasogenic edema at the right parietal vertex relates to a treated lesion in that region. Treated lesions can be identified by CT in the posterior right cerebellum, left occipital lobe, medial right posterior parietal region, and right parietal vertex. None of these appear to have enlarged. No evidence of hydrocephalus or extra-axial collection. Vascular: There is atherosclerotic calcification of the major vessels at the base of the brain. Skull: No calvarial lesion. Sinuses/Orbits: No significant sinus inflammatory disease. Fluid in the left middle ear and mastoid regions. Old right medial  orbital blowout fracture. Other: None IMPRESSION: No new, progressive or worsening findings. Treated lesions affecting the cerebellum and cerebral hemispheres as above without evidence of progression or change. Chronic white matter low d

## 2021-09-03 NOTE — Progress Notes (Signed)
Peri care provided today, upon assessment of patient's perineal and groin area, there was a build up of white and gray clumps. RN pulled back penis foreskin and noted excessive yeast like build up and white discharge from penis. [name] Lelon Frohlich) and Probation officer had an in-depth conversation regarding the type of personal care the patient is receiving at home. Per Lelon Frohlich, there are family friends that help the patient bathe but they do not provide peri care, they allow the patient to do his own peri care. Lelon Frohlich was unaware that the patient was uncircumcised and needed adequate peri care. Ann verbalized that she is the patient's sole caregiver and she also is the sole caregiver of her disabled husband. Per Lelon Frohlich, she has been trying to get home health assistance but has been unsuccessful.  ?

## 2021-09-04 ENCOUNTER — Inpatient Hospital Stay (HOSPITAL_COMMUNITY): Payer: Medicare HMO

## 2021-09-04 DIAGNOSIS — E785 Hyperlipidemia, unspecified: Secondary | ICD-10-CM

## 2021-09-04 DIAGNOSIS — R531 Weakness: Secondary | ICD-10-CM | POA: Diagnosis not present

## 2021-09-04 DIAGNOSIS — N390 Urinary tract infection, site not specified: Secondary | ICD-10-CM

## 2021-09-04 DIAGNOSIS — E1169 Type 2 diabetes mellitus with other specified complication: Secondary | ICD-10-CM | POA: Diagnosis not present

## 2021-09-04 DIAGNOSIS — E119 Type 2 diabetes mellitus without complications: Secondary | ICD-10-CM

## 2021-09-04 DIAGNOSIS — Y92009 Unspecified place in unspecified non-institutional (private) residence as the place of occurrence of the external cause: Secondary | ICD-10-CM

## 2021-09-04 DIAGNOSIS — W19XXXA Unspecified fall, initial encounter: Secondary | ICD-10-CM

## 2021-09-04 LAB — GLUCOSE, CAPILLARY
Glucose-Capillary: 169 mg/dL — ABNORMAL HIGH (ref 70–99)
Glucose-Capillary: 221 mg/dL — ABNORMAL HIGH (ref 70–99)
Glucose-Capillary: 132 mg/dL — ABNORMAL HIGH (ref 70–99)
Glucose-Capillary: 176 mg/dL — ABNORMAL HIGH (ref 70–99)

## 2021-09-04 LAB — URINE CULTURE: Culture: <10000 — AB

## 2021-09-04 IMAGING — CT CT HEAD W/O CM
4 series · 15 of 47 positions shown, 17 images · non-contrast
Comparison: CT brain [DATE] and [DATE]; MRI brain
[DATE]

CLINICAL DATA: Acute neuro deficit. Stroke suspected. Weakness.
Chemotherapy and radiation therapy for metastatic lung cancer.



[Series 2: head wo · axial · 0.45mm/px · z∈[+1558,+1668]mm · 7 of 30 slices shown, 9 images]
[im 4/30  brain]
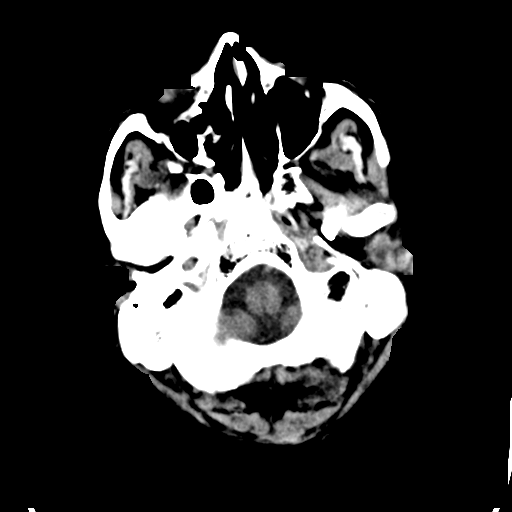
[im 4/30  bone]
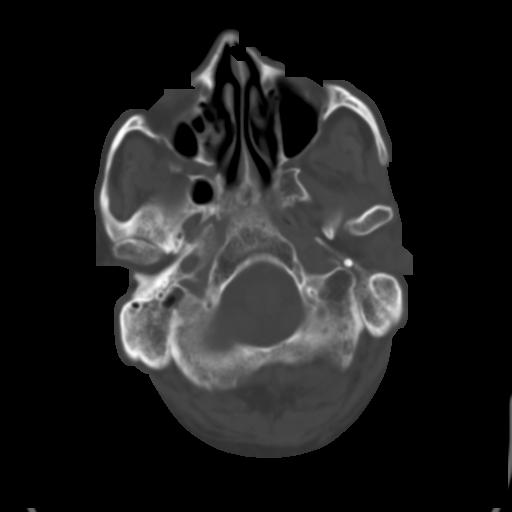
[im 8/30  brain]
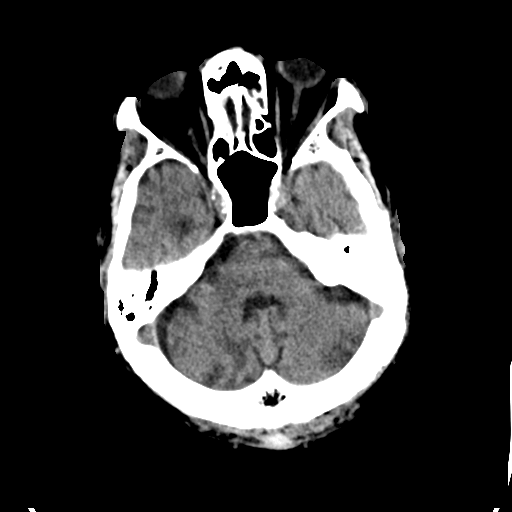
[im 11/30  brain]
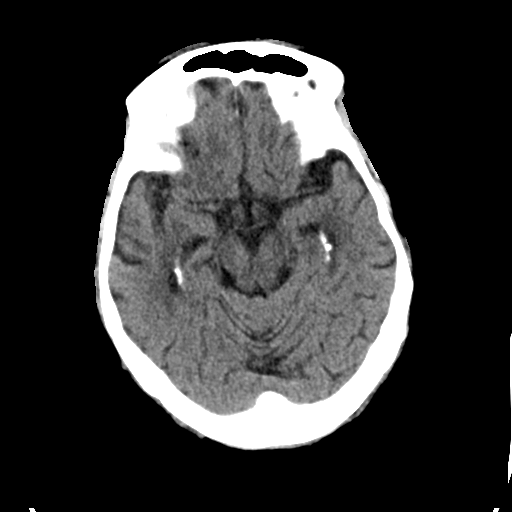
[im 15/30  brain]
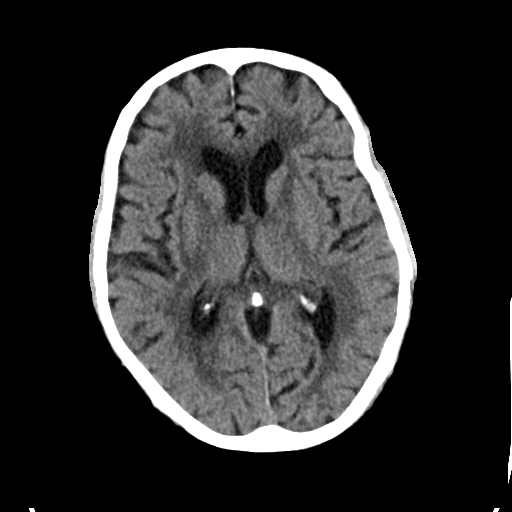
[im 19/30  brain]
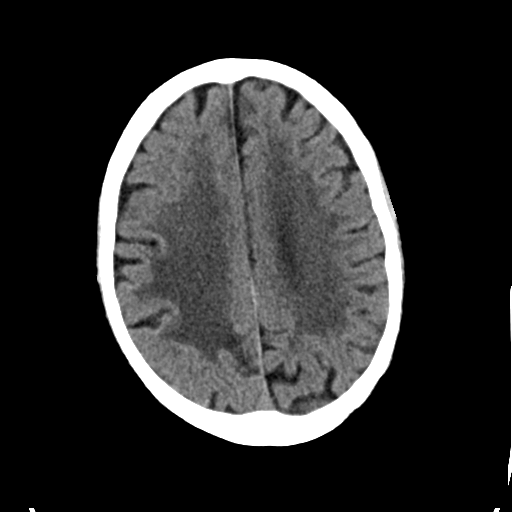
[im 19/30  bone]
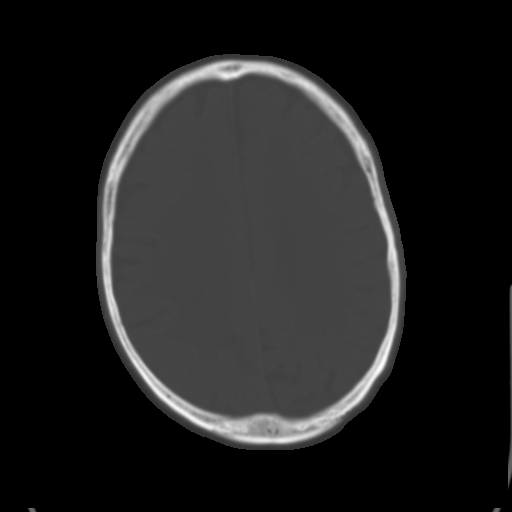
[im 22/30  brain]
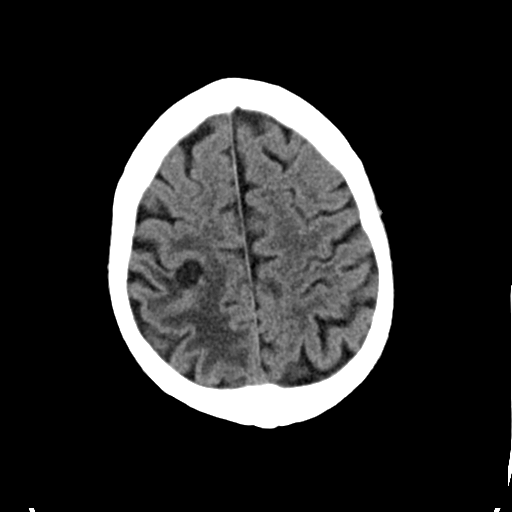
[im 26/30  brain]
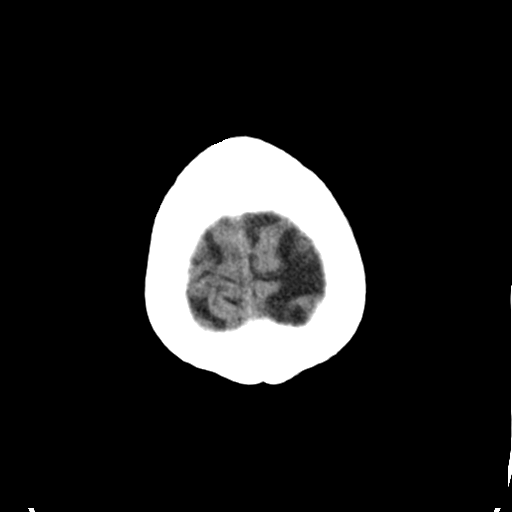

[Series 3: head bone · axial · 0.45mm/px · z∈[+1556,+1570]mm · 2 of 73 slices shown]
[im 8/73  bone]
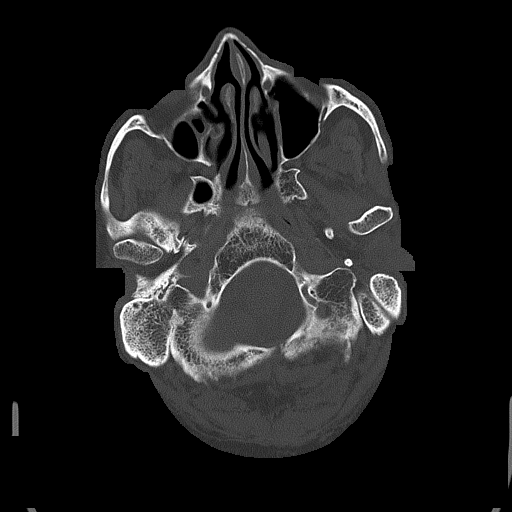
[im 15/73  bone]
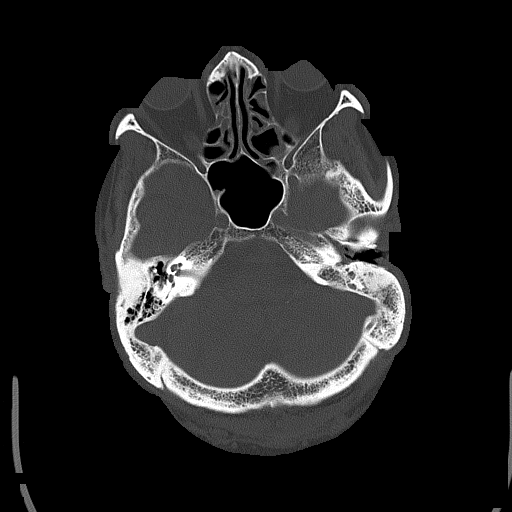

[Series 4: coronal soft tissue · coronal · 0.32mm/px · 3 of 69 slices shown]
[im 23/69  brain]
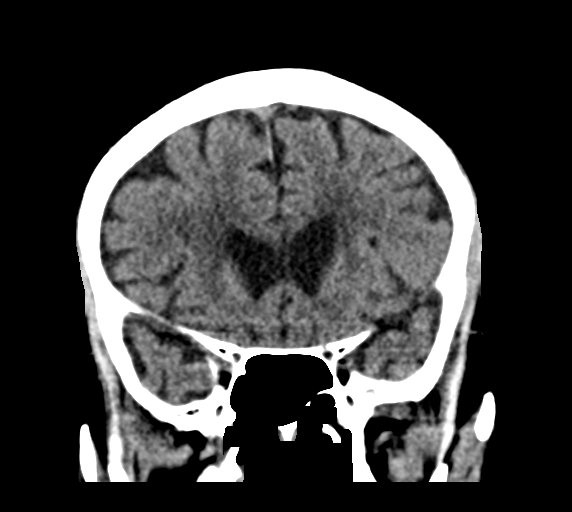
[im 31/69  brain]
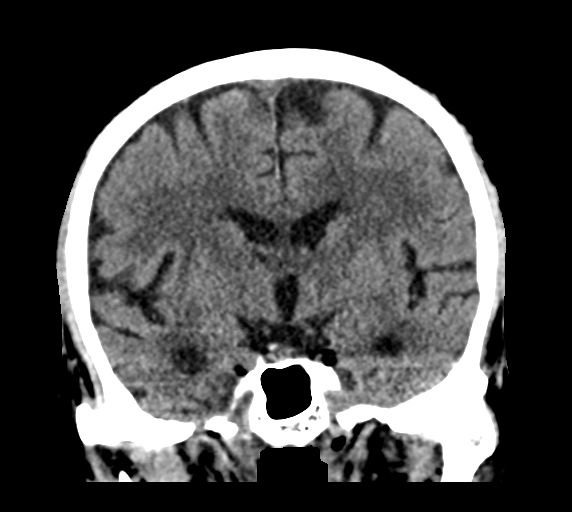
[im 38/69  brain]
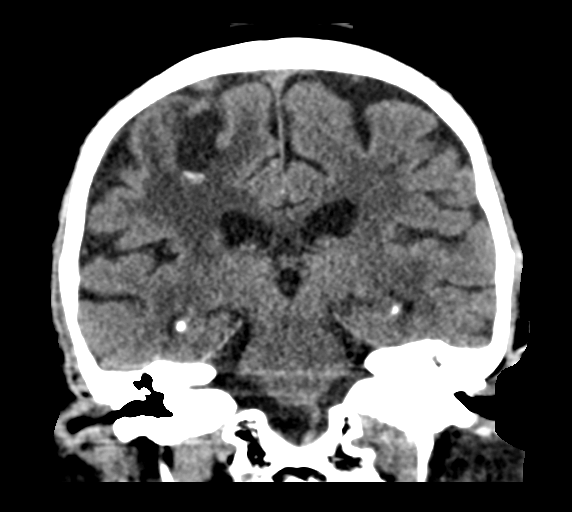

[Series 5: sagittal soft tissue · sagittal · 0.33mm/px · 3 of 67 slices shown]
[im 23/67  brain]
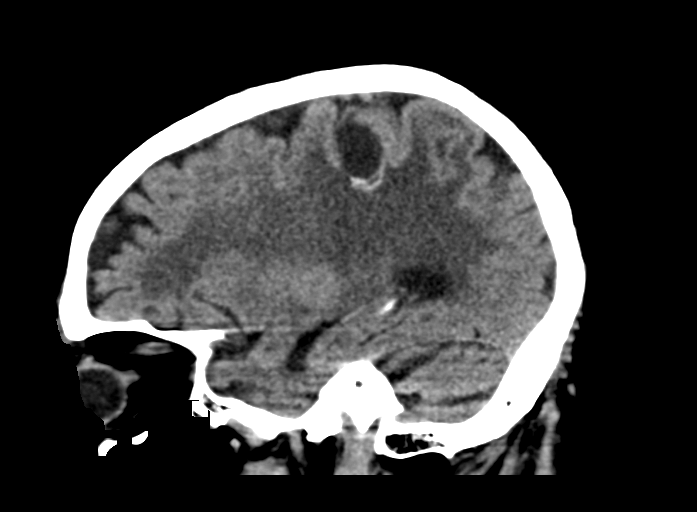
[im 34/67  brain]
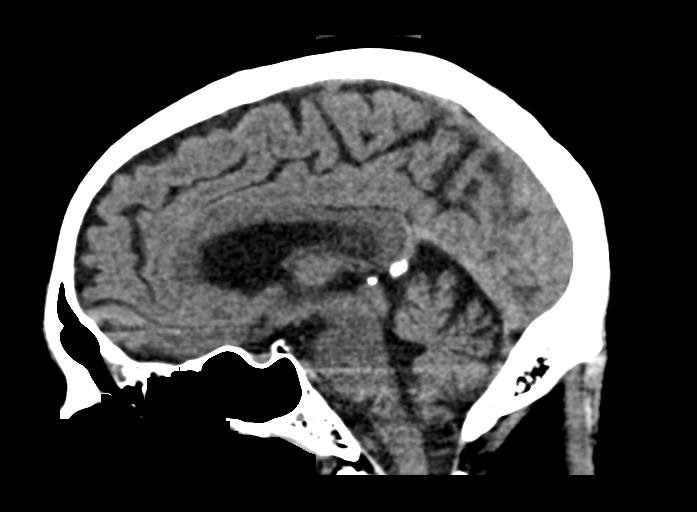
[im 45/67  brain]
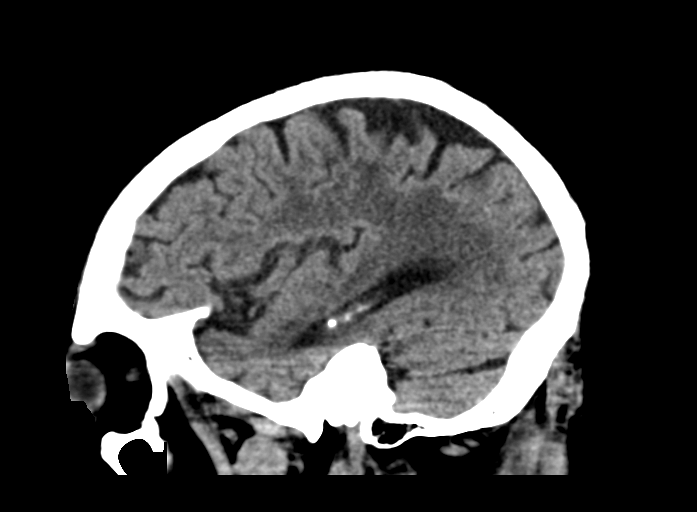

[15 of 47 positions shown; findings below may reference images not displayed]

FINDINGS: Brain: The ventricles are normal in size and configuration. The
basilar cisterns are patent.

No significant change in the treated metastatic lesions within the
posterior right frontal lobe, medial right parieto-occipital lobe,
left occipital lobe, and posterior right cerebellar hemisphere.

No acute intracranial hemorrhage is seen.

No abnormal extra-axial fluid collection.

There is again moderate periventricular and subcortical white matter
patchy hypodensity, likely a combination of chronic ischemic white
matter changes and posttreatment changes.

Vascular: No hyperdense vessel or unexpected calcification.

Skull: Normal. Negative for fracture or focal lesion.

Sinuses/Orbits: The visualized orbits are unremarkable. Moderate
mucosal thickening within the ethmoid air cells is similar to prior.
Opacification of the majority of the left mastoid air cells.
Opacification of the left middle ear. Old right orbital fractures
with depression of the right lamina papyracea.

Other: None.
IMPRESSION: :
IMPRESSION: 1. No significant change from [DATE].
2. Multiple treated metastatic brain lesions.
3. No acute intracranial hemorrhage. No acute intracranial process
is seen.

## 2021-09-04 NOTE — Evaluation (Signed)
Occupational Therapy Evaluation Patient Details Name: Benjamin Willis. MRN: [id number] DOB: 1945-07-11 Today's Date: 09/04/2021   History of Present Illness 76 y.o. male presenting with  weakness, fall, admitted wtih UTI. CTH: No new, progressive or worsening findings. Treated lesions affecting the cerebellum and cerebral hemispheres as above without evidence of progression or change. PMH: DM2, HTN, HLD, NSCLC with brain mets, whole brain XRT.   Clinical Impression   Benjamin Willis is a 76 year old man who presents with generalized weakness, impaired balance, poor activity tolerance and impaired cognition. Patient's detailed PLOF is unknown - from prior visits - he now needs increased assistance for transfers and ADLs. He was unable to take a functional steps today despite tactile cues from therapist and walker - inevitably requiring max assist from therapist for pivot to recliner, BSC and then back to bed. Patient alert to self and hospital and President but did not know the month or year. He reports "I'm weak" and that he recently could walk to the bathroom. His processing his slow and following commands somewhat inconsistent. He was able to drink from a cup and wash his face with his right hand. While seated on BSC patient began drooling out of the right side of his mouth and he was returned to supine for assessment. Patient will benefit from skilled OT services while in hospital to improve deficits and learn compensatory strategies as needed in order to return improve functional abilities and reduce caregiver burden. Recommend short term rehab at discharge.      Recommendations for follow up therapy are one component of a multi-disciplinary discharge planning process, led by the attending physician.  Recommendations may be updated based on patient status, additional functional criteria and insurance authorization.   Follow Up Recommendations  Skilled nursing-short term rehab (<3 hours/day)     Assistance Recommended at Discharge    Patient can return home with the following A lot of help with walking and/or transfers;A lot of help with bathing/dressing/bathroom;Assistance with cooking/housework;Help with stairs or ramp for entrance;Direct supervision/assist for medications management;Direct supervision/assist for financial management;Assist for transportation    Functional Status Assessment  Patient has had a recent decline in their functional status and demonstrates the ability to make significant improvements in function in a reasonable and predictable amount of time.  Equipment Recommendations  None recommended by OT    Recommendations for Other Services       Precautions / Restrictions Precautions Precautions: Fall Restrictions Weight Bearing Restrictions: No      Mobility Bed Mobility Overal bed mobility: Needs Assistance Bed Mobility: Supine to Sit, Sit to Supine     Supine to sit: Max assist, HOB elevated Sit to supine: Mod assist   General bed mobility comments: Max assist for LEs and trunk to transfer to edge of bed. Mod assist for LEs back in bed.    Transfers Overall transfer level: Needs assistance Equipment used: Rolling walker (2 wheels) Transfers: Sit to/from Stand, Bed to chair/wheelchair/BSC Sit to Stand: Mod assist Stand pivot transfers: Max assist         General transfer comment: Unable to take steps. max assist for pivots. Patient holds onto walker but unable to bear through arms to assist with weight shfit and stepping.      Balance Overall balance assessment: Needs assistance Sitting-balance support: No upper extremity supported, Feet supported Sitting balance-Leahy Scale: Fair     Standing balance support: During functional activity, Reliant on assistive device for balance Standing balance-Leahy Scale: Poor  ADL either performed or assessed with clinical judgement   ADL Overall ADL's :  Needs assistance/impaired Eating/Feeding: Set up;Sitting Eating/Feeding Details (indicate cue type and reason): to drink from cup Grooming: Set up;Sitting;Wash/dry hands   Upper Body Bathing: Minimal assistance;Sitting;Set up   Lower Body Bathing: Maximal assistance;Sitting/lateral leans   Upper Body Dressing : Moderate assistance;Sitting   Lower Body Dressing: Total assistance;Sit to/from stand;+2 for physical assistance   Toilet Transfer: Maximal assistance;Stand-pivot;Rolling walker (2 wheels);BSC/3in1   Toileting- Clothing Manipulation and Hygiene: Total assistance;Sit to/from stand       Functional mobility during ADLs: Moderate assistance General ADL Comments: Mod to stand and max assist to transfer to RW, then to [facility] and then from [facility] to bed. Patient unable to take a steps needing significant assistance to pivot. Used walker but he could not use it effectively.     Vision   Vision Assessment?: No apparent visual deficits     Perception     Praxis      Pertinent Vitals/Pain Pain Assessment Pain Assessment: No/denies pain     Hand Dominance Right   Extremity/Trunk Assessment Upper Extremity Assessment Upper Extremity Assessment: RUE deficits/detail;LUE deficits/detail RUE Deficits / Details: WFL ROM, grossly 4-/5 strength - some difficulty with following commands LUE Deficits / Details: WFL ROM, grossly 4-/5 strength - some difficulty with following commands   Lower Extremity Assessment Lower Extremity Assessment: Defer to PT evaluation   Cervical / Trunk Assessment Cervical / Trunk Assessment: Normal   Communication Communication Communication: Receptive difficulties;Expressive difficulties   Cognition Arousal/Alertness: Awake/alert Behavior During Therapy: Flat affect Overall Cognitive Status: No family/caregiver present to determine baseline cognitive functioning Area of Impairment: Following commands                       Following  Commands: Follows one step commands with increased time, Follows multi-step commands inconsistently       General Comments: Alert to self and place but unable to state year or Month. Does know about COVID and That the President is Biden.     General Comments       Exercises     Shoulder Instructions      Home Living Family/patient expects to be discharged to:: Skilled nursing facility Living Arrangements:  (sister) Available Help at Discharge: Family;Available 24 hours/day Type of Home: House Home Access: Stairs to enter [employer] of Steps: 3 Entrance Stairs-Rails: Can reach both;Left;Right Home Layout: Two level;Able to live on main level with bedroom/bathroom     Bathroom Shower/Tub: Tub/shower unit   Bathroom Toilet: Standard     Home Equipment: Agricultural consultant (2 wheels);BSC/3in1;Grab bars - tub/shower;Cane - single point;Shower seat;Hand held shower head   Additional Comments: some info taken from previous admission ~ 2 most ago.  Family not present at time of eval, pt reports he is in agreement to go stay with his sister while he regains his strength and independence, pt reports sister can care for him--? unsure if this remains accurate      Prior Functioning/Environment Prior Level of Function : Needs assist  Cognitive Assist : ADLs (cognitive)   ADLs (Cognitive): Intermittent cues Physical Assist : ADLs (physical)   ADLs (physical): Dressing;Toileting;Bathing;IADLs Mobility Comments: walked with RW without assistance ADLs Comments: Sister helps but unable to elaborate        OT Problem List: Decreased strength;Decreased activity tolerance;Impaired balance (sitting and/or standing);Decreased safety awareness;Decreased cognition;Decreased knowledge of use of DME or AE  OT Treatment/Interventions: Self-care/ADL training;Therapeutic exercise;Neuromuscular education;DME and/or AE instruction;Therapeutic activities;Cognitive  remediation/compensation;Balance training;Patient/family education    OT Goals(Current goals can be found in the care plan section) Acute Rehab OT Goals Patient Stated Goal: feel stronger OT Goal Formulation: With patient Time For Goal Achievement: 09/18/21 Potential to Achieve Goals: Good  OT Frequency: Min 2X/week    Co-evaluation              AM-PAC OT "6 Clicks" Daily Activity     Outcome Measure Help from another person eating meals?: A Little Help from another person taking care of personal grooming?: A Little Help from another person toileting, which includes using toliet, bedpan, or urinal?: Total Help from another person bathing (including washing, rinsing, drying)?: A Lot Help from another person to put on and taking off regular upper body clothing?: A Lot Help from another person to put on and taking off regular lower body clothing?: Total 6 Click Score: 12   End of Session Equipment Utilized During Treatment: Rolling walker (2 wheels);Gait belt Nurse Communication: Mobility status  Activity Tolerance: Patient tolerated treatment well Patient left: in bed;with call bell/phone within reach;with nursing/sitter in room  OT Visit Diagnosis: Unsteadiness on feet (R26.81);Muscle weakness (generalized) (M62.81);Other abnormalities of gait and mobility (R26.89)                Time: 6045-4098 OT Time Calculation (min): 33 min Charges:  OT General Charges $OT Visit: 1 Visit OT Evaluation $OT Eval Moderate Complexity: 1 Mod OT Treatments $Self Care/Home Management : 8-22 mins  Joury Allcorn, OTR/L Acute Care Rehab Services  Office [phone] Pager: [phone]   Kelli Churn 09/04/2021, 1:12 PM

## 2021-09-04 NOTE — [place] FL2 (Signed)
?[place] MEDICAID FL2 LEVEL OF CARE SCREENING TOOL  ?  ? ?IDENTIFICATION  ?Patient Name: ?Benjamin Willis. Birthdate: [birth year] Sex: male Admission Date (Current Location): ?09/02/2021  ?[place] and [place] Number: ? [place] ?  Facility and Address:  ?[facility],  [place], [place] ?     Provider Number: ?[id number]  ?Attending Physician Name and Address:  ?Mendel Corning, MD ? Relative Name and Phone Number:  ?  ?   ?Current Level of Care: ?Hospital Recommended Level of Care: ?[place] Prior Approval Number: ?  ? ?Date Approved/Denied: ?  PASRR Number: ?[id number] A ? ?Discharge Plan: ?SNF ?  ? ?Current Diagnoses: ?Patient Active Problem List  ? Diagnosis Date Noted  ? UTI (urinary tract infection) 09/02/2021  ? Uncontrolled type 2 diabetes mellitus with hyperglycemia ([place]) 08/20/2021  ? Weakness 08/20/2021  ? At risk for falls 08/20/2021  ? Shingles rash 07/07/2021  ? Cognitive impairment 07/04/2021  ? Fall at home, initial encounter 07/03/2021  ? Left leg weakness 07/03/2021  ? Leukopenia with mild neutropenia 07/02/2021  ? Anemia associated with chemotherapy 07/02/2021  ? Non-small cell lung cancer metastatic to brain [facility]) 04/01/2021  ? Port-A-Cath in place 12/14/2020  ? Brain metastases   ? Palliative care by specialist   ? Metastatic lung cancer (metastasis from lung to other site) [facility]) 10/16/2020  ? Diabetes mellitus type 2 in nonobese ([place]) 10/16/2020  ? Glaucoma 10/16/2020  ? Atherosclerosis of aorta ([place]) 05/31/2020  ? Primary cancer of right upper lobe of lung ([place]) 02/07/2019  ? Lung nodule 02/02/2019  ? Hypertension associated with type 2 diabetes mellitus ([place]) 05/24/2018  ? Dyslipidemia 05/24/2018  ? Dyslipidemia associated with type 2 diabetes mellitus ([place]) 06/09/2017  ? Goals of care, counseling/discussion 01/01/2017  ? ? ?Orientation RESPIRATION BLADDER Height & Weight   ?  ?Self, Place ? Normal Incontinent Weight:   ?Height:     ?BEHAVIORAL  SYMPTOMS/MOOD NEUROLOGICAL BOWEL NUTRITION STATUS  ?    Continent Diet (Carb modified)  ?AMBULATORY STATUS COMMUNICATION OF NEEDS Skin   ?Extensive Assist Verbally Normal (irritation to glans of penis and perineal area) ?  ?  ?  ?    ?     ?     ? ? ?Personal Care Assistance Level of Assistance  ?Bathing, Feeding, Dressing Bathing Assistance: Limited assistance ?Feeding assistance: Limited assistance ?Dressing Assistance: Limited assistance ?   ? ?Functional Limitations Info  ?Sight, Hearing, Speech Sight Info: Impaired ?Hearing Info: Adequate ?Speech Info: Impaired (Slurred speech at baseline)  ? ? ?SPECIAL CARE FACTORS FREQUENCY  ?PT (By licensed PT), OT (By licensed OT)   ?  ?PT Frequency: 5 x weekly ?OT Frequency: 5 x weekly ?  ?  ?  ?   ? ? ?Contractures Contractures Info: Not present  ? ? ?Additional Factors Info  ?Code Status, Allergies Code Status Info: Full ?Allergies Info: None known ?  ?  ?  ?   ? ?Current Medications (09/04/2021):  This is the current hospital active medication list ?Current Facility-Administered Medications  ?Medication Dose Route Frequency Provider Last Rate Last Admin  ? 0.9 %  sodium chloride infusion   Intravenous Continuous Darliss Cheney, MD 100 mL/hr at 09/03/21 0914 New Bag at 09/03/21 0914  ? acetaminophen (TYLENOL) tablet 650 mg  650 mg Oral Q6H PRN Cherylann Ratel A, DO      ? Or  ? acetaminophen (TYLENOL) suppository 650 mg  650 mg Rectal Q6H PRN Cherylann Ratel  A, DO      ? cefTRIAXone (ROCEPHIN) 1 g in sodium chloride 0.9 % 100 mL IVPB  1 g Intravenous Q24H Kyle, Tyrone A, DO 200 mL/hr at 09/04/21 1138 1 g at 09/04/21 1138  ? dexamethasone (DECADRON) tablet 4 mg  4 mg Oral Q12H Kyle, Tyrone A, DO   4 mg at 09/04/21 1041  ? folic acid (FOLVITE) tablet 1 mg  1 mg Oral Daily Kyle, Tyrone A, DO   1 mg at 09/04/21 1041  ? gabapentin (NEURONTIN) capsule 100 mg  100 mg Oral BID Marylyn Ishihara, Tyrone A, DO   100 mg at 09/04/21 1041  ? insulin aspart (novoLOG) injection 0-15 Units  0-15 Units  Subcutaneous TID WC Kyle, Tyrone A, DO   5 Units at 09/04/21 1307  ? insulin aspart (novoLOG) injection 0-5 Units  0-5 Units Subcutaneous QHS Kyle, Tyrone A, DO      ? latanoprost (XALATAN) 0.005 % ophthalmic solution 1 drop  1 drop Both Eyes QHS Kyle, Tyrone A, DO   1 drop at 09/03/21 2136  ? levETIRAcetam (KEPPRA) tablet 1,000 mg  1,000 mg Oral BID Marylyn Ishihara, Tyrone A, DO   1,000 mg at 09/04/21 1041  ? metoprolol tartrate (LOPRESSOR) tablet 25 mg  25 mg Oral BID Darliss Cheney, MD   25 mg at 09/04/21 1041  ? ondansetron (ZOFRAN) tablet 4 mg  4 mg Oral Q6H PRN Cherylann Ratel A, DO      ? Or  ? ondansetron (ZOFRAN) injection 4 mg  4 mg Intravenous Q6H PRN Marylyn Ishihara, Tyrone A, DO      ? pantoprazole (PROTONIX) EC tablet 40 mg  40 mg Oral QAC breakfast Marylyn Ishihara, Tyrone A, DO   40 mg at 09/04/21 1041  ? rosuvastatin (CRESTOR) tablet 20 mg  20 mg Oral Daily Kyle, Tyrone A, DO   20 mg at 09/04/21 1041  ? ? ? ?Discharge Medications: ?Please see discharge summary for a list of discharge medications. ? ?Relevant Imaging Results: ? ?Relevant Lab Results: ? ? ?Additional Information ?SSN: [id number] Melba Coon vaccination x 1 ? ?Inanna Telford, Marjie Skiff, RN ? ? ? ? ?

## 2021-09-04 NOTE — Progress Notes (Signed)
Patient noted sitting on BSC with OT present. Patient right side of face appeared droopy. Drool noted coming from patient's right side of mouth and tear from right eye only. Patient verbalized that his right side of face felt numb. Patient returned to bed. Rapid response nurse and Dr. Tana Coast called, both arrived at bedside. Patient taken down for CT. Patient sister [name] at bedside and was updated on patient's status. Patient in bed resting, no additional drooling noted.  ?

## 2021-09-04 NOTE — TOC Initial Note (Signed)
Transition of Care (TOC) - Initial/Assessment Note  ? ? ?Patient Details  ?Name: Benjamin Willis. ?MRN: [id number] ?Date of Birth: [birth year] ? ?Transition of Care (TOC) CM/SW Contact:    ?Calaya Gildner, Marjie Skiff, RN ?Phone Number: ?09/04/2021, 1:54 PM ? ?Clinical Narrative:                 ?Pt from home with sister. Sister cares for him and he is active with [facility] for HHPT/RN. Pt is confused so dc planning done with sister Deanna.  ? ?Physical therapy recommendations for SNF reviewed with Deanna via phone. Deanna states that short term SNF would be good for pt as he is weaker now. Deanna states that pt went to [facility] last time and liked the facility. She states that [facility] would be first choice if he is able to go there. FL2 faxed out to area SNFs. TOC will follow up with bed offers when available. ? ?Expected Discharge Plan: [place] ?Barriers to Discharge: Continued Medical Work up ? ? ?  ? ?Expected Discharge Plan and Services ?Expected Discharge Plan: [employer] ?  ?Discharge Planning Services: CM Consult ?  ?Living arrangements for the past 2 months: [place] ?                ?  ?Prior Living Arrangements/Services ?Living arrangements for the past 2 months: [place] ?Lives with:: Siblings ?Patient language and need for interpreter reviewed:: Yes ?       ?Need for Family Participation in Patient Care: Yes (Comment) ?Care giver support system in place?: Yes (comment) ?  ?Criminal Activity/Legal Involvement Pertinent to Current Situation/Hospitalization: No - Comment as needed ? ? ?Emotional Assessment ?Appearance:: Appears stated age ?  ?  ?Orientation: : Oriented to Self, Oriented to Place ?Alcohol / Substance Use: Not Applicable ?Psych Involvement: No (comment) ? ?Admission diagnosis:  UTI (urinary tract infection) [N39.0] ?Patient Active Problem List  ? Diagnosis Date Noted  ? UTI (urinary tract infection) 09/02/2021  ? Uncontrolled type 2 diabetes  mellitus with hyperglycemia ([place]) 08/20/2021  ? Weakness 08/20/2021  ? At risk for falls 08/20/2021  ? Shingles rash 07/07/2021  ? Cognitive impairment 07/04/2021  ? Fall at home, initial encounter 07/03/2021  ? Left leg weakness 07/03/2021  ? Leukopenia with mild neutropenia 07/02/2021  ? Anemia associated with chemotherapy 07/02/2021  ? Non-small cell lung cancer metastatic to brain [facility]) 04/01/2021  ? Port-A-Cath in place 12/14/2020  ? Brain metastases   ? Palliative care by specialist   ? Metastatic lung cancer (metastasis from lung to other site) [facility]) 10/16/2020  ? Diabetes mellitus type 2 in nonobese ([place]) 10/16/2020  ? Glaucoma 10/16/2020  ? Atherosclerosis of aorta ([place]) 05/31/2020  ? Primary cancer of right upper lobe of lung ([place]) 02/07/2019  ? Lung nodule 02/02/2019  ? Hypertension associated with type 2 diabetes mellitus ([place]) 05/24/2018  ? Dyslipidemia 05/24/2018  ? Dyslipidemia associated with type 2 diabetes mellitus ([place]) 06/09/2017  ? Goals of care, counseling/discussion 01/01/2017  ? ?PCP:  Horald Pollen, MD ?Pharmacy:   ?[facility] [place] [address] ?[place] [address] ?Phone: [phone] Fax: [phone] ? ? ? ? ?Social Determinants of Health (SDOH) Interventions ?  ? ?Readmission Risk Interventions ? ?  09/04/2021  ?  1:53 PM 07/05/2021  ?  1:02 PM 01/10/2021  ?  9:52 AM  ?Readmission Risk Prevention Plan  ?Transportation Screening Complete Complete  Complete  ?PCP or Specialist Appt within 3-5 Days Complete Complete Complete  ?[place] or Home Care Consult Complete Complete Complete  ?Social Work Consult for [place] Planning/Counseling Complete Complete Complete  ?Palliative Care Screening Not Applicable Complete Not Applicable  ?Medication Review Press photographer) Complete Complete Complete  ? ? ? ?

## 2021-09-04 NOTE — Progress Notes (Signed)
?      ? ? ? [facility] ?                                                                           ? ? ?Benjamin Willis, is a 76 y.o. male, DOB - 24-Sep-1945, WUX:[id number] ?Admit date - 09/02/2021    ?Outpatient Primary MD for the patient is [name], MD ? ?LOS - 2  days ? ?Chief Complaint  ?Patient presents with  ? Weakness  ?    ? ?Brief summary  ? ?Patient is a 76 year old male with diabetes mellitus, HTN, HLD, NSCLC presented with generalized weakness.  Patient has had increased urinary frequency, incontinence over the last week prior to admission.  He had seen progressively weaker during this time.  Patient had a chemo session 3 days ago and since that time his appetite has been poor.  On the morning of admission, patient's sister found the patient on the floor next to his bed.  He reported that he was trying to get some underwear on and fell.  He did not hit his head or had any loss of consciousness.  EMS was called. ? ? ?Assessment & Plan  ? ? ?Principal Problem: ?  UTI (urinary tract infection) ?-Urine culture showed less than 10,000 colonies of insignificant growth ?-Patient was placed on IV Rocephin ? ? ?Active Problems: ?  ?Generalized weakness, fall ?-PT OT consulted, recommended SNF ? ?Acute metabolic encephalopathy ?-Patient noted to be somewhat lethargic today, there was question of left-sided weakness and garbled speech however during repeat examination patient is much more alert and oriented and following commands.  No acute weakness noted ?-Stat CT head showed no significant change from 09/02/2021 CT.  Multiple treated metastatic brain lesions, no acute intracranial process. ? 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 ?Diabetes mellitus type 2, NIDDM, uncontrolled with hyperglycemia ?- hold Jardiance and metformin, continue sliding scale insulin while inpatient ?-Hemoglobin A1c 10.5 on  08/20/2021, will repeat in a.m. ? ?Hyperlipidemia ?-Continue statin ? ?Hypertension, tachycardia ?-Continue Lopressor 25 mg twice daily ? ? ?Code Status: Full code ?DVT Prophylaxis:  SCDs Start: 09/02/21 1527 ? ? ?Level of Care: Level of care: Telemetry ?Family Communication: Updated patient ? ?Disposition Plan:      Remains inpatient appropriate: Generalized debility, needs SNF ? ? ?Procedures:  ?None ? ?Consultants:   ?None ? ?Antimicrobials:  ? ?Anti-infectives (From admission, onward)  ? ? Start     Dose/Rate Route Frequency Ordered Stop  ? 09/03/21 1000  cefTRIAXone (ROCEPHIN) 1 g in sodium chloride 0.9 % 100 mL IVPB       ? 1 g ?200 mL/hr over 30 Minutes Intravenous Every 24 hours 09/02/21 1433    ? 09/02/21 1230  cefTRIAXone (ROCEPHIN) 1 g in sodium chloride 0.9 % 100 mL IVPB       ? 1 g ?200 mL/hr over 30 Minutes Intravenous  Once 09/02/21 1224 09/02/21 1407  ? ?  ? ? ? ? ? ?Medications ? dexamethasone  4 mg Oral O53G  ? folic acid  1 mg Oral Daily  ? gabapentin  100 mg Oral  BID  ? insulin aspart  0-15 Units Subcutaneous TID WC  ? insulin aspart  0-5 Units Subcutaneous QHS  ? latanoprost  1 drop Both Eyes QHS  ? levETIRAcetam  1,000 mg Oral BID  ? metoprolol tartrate  25 mg Oral BID  ? pantoprazole  40 mg Oral QAC breakfast  ? rosuvastatin  20 mg Oral Daily  ? ? ? ? ?Subjective:  ? ?Benjamin Willis was seen and examined today.  Somewhat lethargic in a.m. however subsequently called to examine again due to ?  Left-sided weakness and speech deficit.  On repeat examination, patient much more alert and oriented, following commands, no significant focal weakness noted ? ?Objective:  ? ?Vitals:  ? 09/03/21 1352 09/03/21 2018 09/04/21 0500 09/04/21 1109  ?BP: 121/88 124/87 137/86 (!) 148/96  ?Pulse: 98 (!) 105 92 93  ?Resp: 18 16 14 16   ?Temp: 98 ?F (36.7 ?C) 98 ?F (36.7 ?C) 97.7 ?F (36.5 ?C) 98.1 ?F (36.7 ?C)  ?TempSrc: Oral Oral Oral Oral  ?SpO2: 98% 97% 99% 98%  ? ? ?Intake/Output Summary (Last 24 hours) at  09/04/2021 1340 ?Last data filed at 09/04/2021 0844 ?Gross per 24 hour  ?Intake 905.57 ml  ?Output 1450 ml  ?Net -544.43 ml  ? ? ? ?Wt Readings from Last 3 Encounters:  ?08/30/21 62.2 kg  ?08/20/21 68.2 kg  ?08/09/21 70.2 kg  ? ? ? ?Exam ?General: Alert and oriented x 3, NAD ?Cardiovascular: S1 S2 auscultated,  RRR ?Respiratory: Clear to auscultation bilaterally, no wheezing ?Gastrointestinal: Soft, nontender, nondistended, + bowel sounds ?Ext: no pedal edema bilaterally ?Neuro: Strength 5/5 upper and lower extremities bilaterally ?Psych: Normal affect and demeanor, alert and oriented x3  ? ? ? ?Data Reviewed:  I have personally reviewed following labs  ? ? ?CBC ?Lab Results  ?Component Value Date  ? WBC 8.5 09/03/2021  ? RBC 3.68 (L) 09/03/2021  ? HGB 12.3 (L) 09/03/2021  ? HCT 36.9 (L) 09/03/2021  ? MCV 100.3 (H) 09/03/2021  ? MCH 33.4 09/03/2021  ? PLT 244 09/03/2021  ? MCHC 33.3 09/03/2021  ? RDW 14.5 09/03/2021  ? LYMPHSABS 0.7 08/30/2021  ? MONOABS 0.9 08/30/2021  ? EOSABS 0.0 08/30/2021  ? BASOSABS 0.0 08/30/2021  ? ? ? ?Last metabolic panel ?Lab Results  ?Component Value Date  ? NA 145 09/03/2021  ? K 4.4 09/03/2021  ? CL 111 09/03/2021  ? CO2 19 (L) 09/03/2021  ? BUN 14 09/03/2021  ? CREATININE 1.00 09/03/2021  ? GLUCOSE 148 (H) 09/03/2021  ? GFRNONAA >60 09/03/2021  ? GFRAA 101 12/06/2019  ? CALCIUM 8.9 09/03/2021  ? PHOS 4.1 07/09/2021  ? PROT 6.0 (L) 09/03/2021  ? ALBUMIN 3.1 (L) 09/03/2021  ? LABGLOB 2.3 12/06/2019  ? AGRATIO 1.8 12/06/2019  ? BILITOT 1.4 (H) 09/03/2021  ? ALKPHOS 72 09/03/2021  ? AST 27 09/03/2021  ? ALT 13 09/03/2021  ? ANIONGAP 15 09/03/2021  ? ? ?CBG (last 3)  ?Recent Labs  ?  09/03/21 ?2123 09/04/21 ?0724 09/04/21 ?1142  ?GLUCAP 173* 169* 221*  ?  ? ? ? ? ? ?Radiology Studies: I have personally reviewed the imaging studies  ?CT HEAD WO CONTRAST (5MM) ? ?Result Date: 09/04/2021 ? IMPRESSION: 1. No significant change from 09/02/2021. 2. Multiple treated metastatic brain lesions. 3. No  acute intracranial hemorrhage. No acute intracranial process is seen. Electronically Signed   By: Yvonne Kendall M.D.   On: 09/04/2021 11:48   ? ? ? ? ?Estill Cotta M.D. ?[facility] ?09/04/2021, 1:40 PM ? ?Available via Epic secure chat 7am-7pm ?After 7 pm, please refer to night coverage provider listed on amion. ? ? ? ?

## 2021-09-04 NOTE — Consult Note (Signed)
[facility] CM Inpatient Consult ? ? ?09/04/2021 ? ?Benjamin Willis. ?Jul 11, 1945 ?[id number] ? ?[facility] CM) ? ?Patient chart reviewed with noted high risk score for unplanned readmission. Assessed for post hospital chronic care management needs with [facility] CM services. Per review, primary provider office offers chronic case management team and is listed for post hospital follow up and appointments.  ? ?Plan: Will continue to follow for progression and disposition plans. ? ?Of note, [facility] Care Management services does not replace or interfere with any services that are arranged by inpatient case management or social work.   ? ?Netta Cedars, MSN, RN ?[facility] Liaison ?Phone [phone] ?Toll free office [phone]  ?

## 2021-09-04 NOTE — Progress Notes (Signed)
Physical Therapy Treatment ?Patient Details ?Name: Benjamin Willis. ?MRN: [id number] ?DOB: 06/04/1945 ?Today's Date: 09/04/2021 ? ? ?History of Present Illness 76 y.o. male presenting with  weakness, fall, admitted wtih UTI. CTH: No new, progressive or worsening findings. Treated lesions affecting the cerebellum and cerebral hemispheres as above without evidence of progression or change. PMH: DM2, HTN, HLD, NSCLC with brain mets, whole brain XRT. ? ?  ?PT Comments  ? ? Pt sleeping in bed, he stated he was too tired to attempt getting out of bed. He was up earlier with OT today. Pt agreed to bed level exercises and performed those with assistance.    ?Recommendations for follow up therapy are one component of a multi-disciplinary discharge planning process, led by the attending physician.  Recommendations may be updated based on patient status, additional functional criteria and insurance authorization. ? ?Follow Up Recommendations ? Skilled nursing-short term rehab (<3 hours/day) ?  ?  ?Assistance Recommended at Discharge Frequent or constant Supervision/Assistance  ?Patient can return home with the following Help with stairs or ramp for entrance;A lot of help with walking and/or transfers;A lot of help with bathing/dressing/bathroom;Assist for transportation;Direct supervision/assist for financial management;Assistance with cooking/housework;Direct supervision/assist for medications management ?  ?Equipment Recommendations ?    ?  ?Recommendations for Other Services   ? ? ?  ?Precautions / Restrictions Precautions ?Precautions: Fall ?Restrictions ?Weight Bearing Restrictions: No  ?  ? ?Mobility ? Bed Mobility ?  ?  ?  ?  ?  ?  ?  ?General bed mobility comments: pt sleeping in bed, stated he's too tired to get up again (he was up with OT earlier today), agreed to bed level exercises ?  ? ?Transfers ?  ?  ?  ?  ?  ?  ?  ?  ?  ?  ?  ? ?Ambulation/Gait ?  ?  ?  ?  ?  ?  ?  ?  ? ? ?Stairs ?  ?  ?  ?  ?  ? ? ?Wheelchair  Mobility ?  ? ?Modified Rankin (Stroke Patients Only) ?  ? ? ?  ?Balance   ?  ?  ?  ?  ?  ?  ?  ?  ?  ?  ?  ?  ?  ?  ?  ?  ?  ?  ?  ? ?  ?Cognition Arousal/Alertness: Awake/alert ?Behavior During Therapy: Flat affect ?Overall Cognitive Status: No family/caregiver present to determine baseline cognitive functioning ?Area of Impairment: Following commands ?  ?  ?  ?  ?  ?  ?  ?  ?  ?  ?  ?Following Commands: Follows one step commands with increased time, Follows multi-step commands inconsistently ?  ?  ?  ?General Comments: Alert to self and place but unable to state year or Month. Does know That the President is Biden. ?  ?  ? ?  ?Exercises General Exercises - Upper Extremity ?Shoulder Flexion: AAROM, Both, 10 reps, Supine ?General Exercises - Lower Extremity ?Ankle Circles/Pumps: AAROM, 10 reps, Both, Supine ?Short Arc Quad: AAROM, Both, 10 reps, Supine ?Heel Slides: AAROM, Both, 10 reps, Supine ?Hip ABduction/ADduction: AAROM, Both, 10 reps, Supine ? ?  ?General Comments   ?  ?  ? ?Pertinent Vitals/Pain Pain Assessment ?Pain Assessment: No/denies pain  ? ? ?Home Living Family/patient expects to be discharged to:: Skilled nursing facility ?Living Arrangements:  (sister) ?Available Help at Discharge: Family;Available 24 hours/day ?Type of Home: House ?Home Access:  Stairs to enter ?Entrance Stairs-Rails: Can reach both;Left;Right ?Entrance Stairs-Number of Steps: 3 ?  ?Home Layout: Two level;Able to live on main level with bedroom/bathroom ?Home Equipment: Rolling Walker (2 wheels);BSC/3in1;Grab bars - tub/shower;Cane - single point;Shower seat;Hand held shower head ?Additional Comments: some info taken from previous admission ~ 2 most ago.  Family not present at time of eval, pt reports he is in agreement to go stay with his sister while he regains his strength and independence, pt reports sister can care for him--? unsure if this remains accurate  ?  ?Prior Function    ?  ?  ?   ? ?PT Goals (current goals can now be  found in the care plan section) Acute Rehab PT Goals ?Patient Stated Goal: none stated ?PT Goal Formulation: Patient unable to participate in goal setting ?Time For Goal Achievement: 09/17/21 ?Potential to Achieve Goals: Good ?Progress towards PT goals: Progressing toward goals ? ?  ?Frequency ? ? ? Min 2X/week ? ? ? ?  ?PT Plan Current plan remains appropriate  ? ? ?Co-evaluation   ?  ?  ?  ?  ? ?  ?AM-PAC PT "6 Clicks" Mobility   ?Outcome Measure ? Help needed turning from your back to your side while in a flat bed without using bedrails?: A Lot ?Help needed moving from lying on your back to sitting on the side of a flat bed without using bedrails?: A Lot ?Help needed moving to and from a bed to a chair (including a wheelchair)?: A Lot ?Help needed standing up from a chair using your arms (e.g., wheelchair or bedside chair)?: Total ?Help needed to walk in hospital room?: Total ?Help needed climbing 3-5 steps with a railing? : Total ?6 Click Score: 9 ? ?  ?End of Session   ?Activity Tolerance: Patient limited by fatigue ?Patient left: in bed;with call bell/phone within reach;with bed alarm set ?Nurse Communication: Mobility status ?PT Visit Diagnosis: Other abnormalities of gait and mobility (R26.89);Difficulty in walking, not elsewhere classified (R26.2) ?  ? ? ?Time: 6468-0321 ?PT Time Calculation (min) (ACUTE ONLY): 11 min ? ?Charges:  $Therapeutic Exercise: 8-22 mins          ?          ? ?Philomena Doheny PT 09/04/2021  ?Acute Rehabilitation Services ?Pager [phone] ?Office [phone] ? ? ?

## 2021-09-04 NOTE — Consult Note (Signed)
WOC Nurse Consult Note: ?Reason for Consult:Consult for pericare with irritation noted to glans of penis and perineal skin.  Heavy build up of smegma to foreskin with musty odor.  Bedside RN has worked with patient's family regarding appropriate pericare and care of the uncirumcised penis.   ?Wound type:irritation ?Pressure Injury POA: NA ?Measurement:generalized erythema and irritation ?Wound DGL:OVFIE erythema ?Drainage (amount, consistency, odor) no drainage  musty odor ?Periwound: intact ?Dressing procedure/placement/frequency: Cleanse perineal skin with soap and water each shift.  Peel back foreskin and cleanse glans of penis twice daily and PRN soilage.  Apply barrier cream after pericare.  ?Will not follow at this time.  Please re-consult if needed.  ?Domenic Moras MSN, RN, FNP-BC CWON ?Wound, Ostomy, Continence Nurse ?Pager [phone]  ? ? ?  ?

## 2021-09-05 ENCOUNTER — Inpatient Hospital Stay: Payer: Medicare HMO | Admitting: Internal Medicine

## 2021-09-05 DIAGNOSIS — N390 Urinary tract infection, site not specified: Secondary | ICD-10-CM | POA: Diagnosis not present

## 2021-09-05 DIAGNOSIS — E1169 Type 2 diabetes mellitus with other specified complication: Secondary | ICD-10-CM | POA: Diagnosis not present

## 2021-09-05 DIAGNOSIS — E119 Type 2 diabetes mellitus without complications: Secondary | ICD-10-CM | POA: Diagnosis not present

## 2021-09-05 DIAGNOSIS — W19XXXA Unspecified fall, initial encounter: Secondary | ICD-10-CM | POA: Diagnosis not present

## 2021-09-05 LAB — CBC
HCT: 32.8 % — ABNORMAL LOW (ref 39.0–52.0)
Hemoglobin: 11.2 g/dL — ABNORMAL LOW (ref 13.0–17.0)
MCH: 33.3 pg (ref 26.0–34.0)
MCHC: 34.1 g/dL (ref 30.0–36.0)
MCV: 97.6 fL (ref 80.0–100.0)
Platelets: 201 10*3/uL (ref 150–400)
RBC: 3.36 MIL/uL — ABNORMAL LOW (ref 4.22–5.81)
RDW: 14.2 % (ref 11.5–15.5)
WBC: 0.6 10*3/uL — CL (ref 4.0–10.5)
nRBC: 0 % (ref 0.0–0.2)

## 2021-09-05 LAB — BASIC METABOLIC PANEL
Anion gap: 9 (ref 5–15)
BUN: 11 mg/dL (ref 8–23)
CO2: 25 mmol/L (ref 22–32)
Calcium: 8.6 mg/dL — ABNORMAL LOW (ref 8.9–10.3)
Chloride: 110 mmol/L (ref 98–111)
Creatinine, Ser: 0.72 mg/dL (ref 0.61–1.24)
GFR, Estimated: >60 mL/min (ref >60)
Glucose, Bld: 157 mg/dL — ABNORMAL HIGH (ref 70–99)
Potassium: 3.4 mmol/L — ABNORMAL LOW (ref 3.5–5.1)
Sodium: 144 mmol/L (ref 135–145)

## 2021-09-05 LAB — GLUCOSE, CAPILLARY
Glucose-Capillary: 126 mg/dL — ABNORMAL HIGH (ref 70–99)
Glucose-Capillary: 144 mg/dL — ABNORMAL HIGH (ref 70–99)
Glucose-Capillary: 127 mg/dL — ABNORMAL HIGH (ref 70–99)
Glucose-Capillary: 147 mg/dL — ABNORMAL HIGH (ref 70–99)

## 2021-09-05 LAB — HEMOGLOBIN A1C
Hgb A1c MFr Bld: 10.6 % — ABNORMAL HIGH (ref 4.8–5.6)
Mean Plasma Glucose: 257.52 mg/dL

## 2021-09-05 NOTE — Progress Notes (Signed)
?      ? ? ? [facility] ?                                                                           ? ? ?Motty Borin, is a 76 y.o. male, DOB - 05/24/1945, FGH:[id number] ?Admit date - 09/02/2021    ?Outpatient Primary MD for the patient is [name], MD ? ?LOS - 3  days ? ?Chief Complaint  ?Patient presents with  ? Weakness  ?    ? ?Brief summary  ? ?Patient is a 76 year old male with diabetes mellitus, HTN, HLD, NSCLC presented with generalized weakness.  Patient has had increased urinary frequency, incontinence over the last week prior to admission.  He had seen progressively weaker during this time.  Patient had a chemo session 3 days ago and since that time his appetite has been poor.  On the morning of admission, patient's sister found the patient on the floor next to his bed.  He reported that he was trying to get some underwear on and fell.  He did not hit his head or had any loss of consciousness.  EMS was called. ? ? ?Assessment & Plan  ? ? ?Principal Problem: ?  UTI (urinary tract infection) ?-Urine culture showed less than 10,000 colonies of insignificant growth ?-Patient was placed on IV Rocephin, has received 3 doses ? ? ?Active Problems: ?  ?Generalized weakness, fall ?-PT OT consulted, recommended SNF for rehab ? ?Acute metabolic encephalopathy ?-Much improved today and close to his baseline.  Patient was noted to be lethargic, garbled speech and question of focal weakness on 5/3.   ?-Stat CT head showed no significant change from 09/02/2021 CT.  Multiple treated metastatic brain lesions, no acute intracranial process. ? 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 ?Diabetes mellitus type 2, NIDDM, uncontrolled with hyperglycemia ?- hold Jardiance and metformin, continue sliding scale insulin while inpatient ?-Hemoglobin A1c 10.5 on 08/20/2021, will repeat in a.m. ? ?Hyperlipidemia ?-Continue  statin ? ?Hypertension, tachycardia ?-Continue Lopressor 25 mg twice daily ? ? ?Code Status: Full code ?DVT Prophylaxis:  SCDs Start: 09/02/21 1527 ? ? ?Level of Care: Level of care: Telemetry ?Family Communication: Updated patient's sister on the phone ? ?Disposition Plan:      Remains inpatient appropriate: Generalized debility, needs SNF, authorization started ? ? ?Procedures:  ?None ? ?Consultants:   ?None ? ?Antimicrobials: None ? ? ?Medications ? dexamethasone  4 mg Oral C78L  ? folic acid  1 mg Oral Daily  ? gabapentin  100 mg Oral BID  ? insulin aspart  0-15 Units Subcutaneous TID WC  ? insulin aspart  0-5 Units Subcutaneous QHS  ? latanoprost  1 drop Both Eyes QHS  ? levETIRAcetam  1,000 mg Oral BID  ? metoprolol tartrate  25 mg Oral BID  ? pantoprazole  40 mg Oral QAC breakfast  ? rosuvastatin  20 mg Oral Daily  ? ? ? ? ?Subjective:  ? ?Shaiden Aldous was seen and examined today.  Much more alert and oriented today, denies any specific complaint, no focal weakness noted. ?Objective:  ? ?Vitals:  ? 09/04/21  1449 09/04/21 2106 09/05/21 0553 09/05/21 1046  ?BP: 133/83 134/89 (!) 161/96 123/87  ?Pulse: 91 87 86 83  ?Resp: 11 18 14    ?Temp: 97.7 ?F (36.5 ?C) (!) 97.5 ?F (36.4 ?C) (!) 97.4 ?F (36.3 ?C)   ?TempSrc: Oral Oral Oral   ?SpO2: 99% 98% 99%   ? ? ?Intake/Output Summary (Last 24 hours) at 09/05/2021 1318 ?Last data filed at 09/05/2021 1610 ?Gross per 24 hour  ?Intake 680 ml  ?Output 450 ml  ?Net 230 ml  ? ? ? ?Wt Readings from Last 3 Encounters:  ?08/30/21 62.2 kg  ?08/20/21 68.2 kg  ?08/09/21 70.2 kg  ? ? ?Physical Exam ?General: Alert and oriented x 3, NAD ?Cardiovascular: S1 S2 clear, RRR ?Respiratory: CTAB ?Gastrointestinal: Soft, nontender, nondistended, NBS ?Ext: no pedal edema bilaterally ?Neuro: no new deficits ?Psych: Normal affect and demeanor, alert and oriented x3  ? ? ? ?Data Reviewed:  I have personally reviewed following labs  ? ? ?CBC ?Lab Results  ?Component Value Date  ? WBC 8.5 09/03/2021   ? RBC 3.68 (L) 09/03/2021  ? HGB 12.3 (L) 09/03/2021  ? HCT 36.9 (L) 09/03/2021  ? MCV 100.3 (H) 09/03/2021  ? MCH 33.4 09/03/2021  ? PLT 244 09/03/2021  ? MCHC 33.3 09/03/2021  ? RDW 14.5 09/03/2021  ? LYMPHSABS 0.7 08/30/2021  ? MONOABS 0.9 08/30/2021  ? EOSABS 0.0 08/30/2021  ? BASOSABS 0.0 08/30/2021  ? ? ? ?Last metabolic panel ?Lab Results  ?Component Value Date  ? NA 145 09/03/2021  ? K 4.4 09/03/2021  ? CL 111 09/03/2021  ? CO2 19 (L) 09/03/2021  ? BUN 14 09/03/2021  ? CREATININE 1.00 09/03/2021  ? GLUCOSE 148 (H) 09/03/2021  ? GFRNONAA >60 09/03/2021  ? GFRAA 101 12/06/2019  ? CALCIUM 8.9 09/03/2021  ? PHOS 4.1 07/09/2021  ? PROT 6.0 (L) 09/03/2021  ? ALBUMIN 3.1 (L) 09/03/2021  ? LABGLOB 2.3 12/06/2019  ? AGRATIO 1.8 12/06/2019  ? BILITOT 1.4 (H) 09/03/2021  ? ALKPHOS 72 09/03/2021  ? AST 27 09/03/2021  ? ALT 13 09/03/2021  ? ANIONGAP 15 09/03/2021  ? ? ?CBG (last 3)  ?Recent Labs  ?  09/04/21 ?2132 09/05/21 ?0724 09/05/21 ?1135  ?GLUCAP 176* 126* 144*  ?  ? ? ? ? ? ?Radiology Studies: I have personally reviewed the imaging studies  ?CT HEAD WO CONTRAST (5MM) ? ?Result Date: 09/04/2021 ? IMPRESSION: 1. No significant change from 09/02/2021. 2. Multiple treated metastatic brain lesions. 3. No acute intracranial hemorrhage. No acute intracranial process is seen. Electronically Signed   By: Yvonne Kendall M.D.   On: 09/04/2021 11:48   ? ? ? ? ?Estill Cotta M.D. ?[facility] ?09/05/2021, 1:18 PM ? ?Available via Epic secure chat 7am-7pm ?After 7 pm, please refer to night coverage provider listed on amion. ? ? ? ?

## 2021-09-05 NOTE — TOC Progression Note (Signed)
Transition of Care (TOC) - Progression Note  ? ? ?Patient Details  ?Name: Benjamin Willis. ?MRN: [id number] ?Date of Birth: 06/22/1945 ? ?Transition of Care (TOC) CM/SW Contact  ?Eliseo Withers, Marjie Skiff, RN ?Phone Number: ?09/05/2021, 12:32 PM ? ?Clinical Narrative:    ?Spoke with pt at bedside for dc planning. Pt agreed to SNF placement and asked to return to [facility]. [facility] has offered pt a bed with the understanding that they cannot take him if he is going to receive any cancer treatment while at the facility. Pt made aware and agrees. Message sent to Oncologist to inquire about further treatment. Per Dr. Mickeal Skinner, no treatment is planned at this time and Oncology will follow up with pt once he is back home.  ? ?Attempted to start insurance auth for SNF and pt is not managed by [facility]. [place] liaison to start insurance Lillie. Will call sister to alert of above information. ? ?Expected Discharge Plan: [place] ?Barriers to Discharge: Continued Medical Work up ? ?Expected Discharge Plan and Services ?Expected Discharge Plan: [facility] ?  ?Discharge Planning Services: CM Consult ?  ?Living arrangements for the past 2 months: [place] ?      ?Readmission Risk Interventions ? ?  09/04/2021  ?  1:53 PM 07/05/2021  ?  1:02 PM 01/10/2021  ?  9:52 AM  ?Readmission Risk Prevention Plan  ?Transportation Screening Complete Complete Complete  ?PCP or Specialist Appt within 3-5 Days Complete Complete Complete  ?[place] or Home Care Consult Complete Complete Complete  ?Social Work Consult for [place] Planning/Counseling Complete Complete Complete  ?Palliative Care Screening Not Applicable Complete Not Applicable  ?Medication Review [employer] photographer) Complete Complete Complete  ? ? ?

## 2021-09-06 DIAGNOSIS — N390 Urinary tract infection, site not specified: Secondary | ICD-10-CM | POA: Diagnosis not present

## 2021-09-06 DIAGNOSIS — W19XXXA Unspecified fall, initial encounter: Secondary | ICD-10-CM | POA: Diagnosis not present

## 2021-09-06 DIAGNOSIS — E119 Type 2 diabetes mellitus without complications: Secondary | ICD-10-CM | POA: Diagnosis not present

## 2021-09-06 DIAGNOSIS — E1169 Type 2 diabetes mellitus with other specified complication: Secondary | ICD-10-CM | POA: Diagnosis not present

## 2021-09-06 LAB — CBC WITH DIFFERENTIAL/PLATELET
Abs Immature Granulocytes: 0 10*3/uL (ref 0.00–0.07)
Basophils Absolute: 0 10*3/uL (ref 0.0–0.1)
Basophils Relative: 2 %
Eosinophils Absolute: 0 10*3/uL (ref 0.0–0.5)
Eosinophils Relative: 2 %
HCT: 36.1 % — ABNORMAL LOW (ref 39.0–52.0)
Hemoglobin: 12.2 g/dL — ABNORMAL LOW (ref 13.0–17.0)
Immature Granulocytes: 0 %
Lymphocytes Relative: 54 %
Lymphs Abs: 0.4 10*3/uL — ABNORMAL LOW (ref 0.7–4.0)
MCH: 32.5 pg (ref 26.0–34.0)
MCHC: 33.8 g/dL (ref 30.0–36.0)
MCV: 96.3 fL (ref 80.0–100.0)
Monocytes Absolute: 0.1 10*3/uL (ref 0.1–1.0)
Monocytes Relative: 8 %
Neutro Abs: 0.2 10*3/uL — CL (ref 1.7–7.7)
Neutrophils Relative %: 34 %
Platelets: 179 10*3/uL (ref 150–400)
RBC: 3.75 MIL/uL — ABNORMAL LOW (ref 4.22–5.81)
RDW: 13.9 % (ref 11.5–15.5)
WBC: 0.7 10*3/uL — CL (ref 4.0–10.5)
nRBC: 0 % (ref 0.0–0.2)

## 2021-09-06 LAB — GLUCOSE, CAPILLARY
Glucose-Capillary: 97 mg/dL (ref 70–99)
Glucose-Capillary: 176 mg/dL — ABNORMAL HIGH (ref 70–99)
Glucose-Capillary: 211 mg/dL — ABNORMAL HIGH (ref 70–99)
Glucose-Capillary: 185 mg/dL — ABNORMAL HIGH (ref 70–99)

## 2021-09-06 LAB — BASIC METABOLIC PANEL
Anion gap: 9 (ref 5–15)
BUN: 9 mg/dL (ref 8–23)
CO2: 24 mmol/L (ref 22–32)
Calcium: 8.3 mg/dL — ABNORMAL LOW (ref 8.9–10.3)
Chloride: 109 mmol/L (ref 98–111)
Creatinine, Ser: 0.63 mg/dL (ref 0.61–1.24)
GFR, Estimated: >60 mL/min (ref >60)
Glucose, Bld: 95 mg/dL (ref 70–99)
Potassium: 3.5 mmol/L (ref 3.5–5.1)
Sodium: 142 mmol/L (ref 135–145)

## 2021-09-06 LAB — BRAIN NATRIURETIC PEPTIDE: B Natriuretic Peptide: 44.8 pg/mL (ref 0.0–100.0)

## 2021-09-06 MED ORDER — TBO-FILGRASTIM 300 MCG/0.5ML ~~LOC~~ SOSY
300.0000 ug | PREFILLED_SYRINGE | Freq: Once | SUBCUTANEOUS | Status: AC
  Administered 2021-09-06: 300 ug via SUBCUTANEOUS
  Filled 2021-09-06: qty 0.5

## 2021-09-06 NOTE — Progress Notes (Signed)
Occupational Therapy Treatment ?Patient Details ?Name: Benjamin Willis. ?MRN: [id number] ?DOB: May 08, 1945 ?Today's Date: 09/06/2021 ? ? ?History of present illness 76 y.o. male presenting with  weakness, fall, admitted wtih UTI. CTH: No new, progressive or worsening findings. Treated lesions affecting the cerebellum and cerebral hemispheres as above without evidence of progression or change. PMH: DM2, HTN, HLD, NSCLC with brain mets, whole brain XRT. ?  ?OT comments ? Treatment focused on functional mobility - getting him to stand and take steps - in order to advance ADLs. He required assistance for weight shifting and advancing feet with use of a walker as well. He is not weight shifting on to his right lower extremity in order to take effective steps. He exhibited improved ability to stay on his feet and attempt steps compared to last visit. Will continue POC.  ? ?Recommendations for follow up therapy are one component of a multi-disciplinary discharge planning process, led by the attending physician.  Recommendations may be updated based on patient status, additional functional criteria and insurance authorization. ?   ?Follow Up Recommendations ? Skilled nursing-short term rehab (<3 hours/day)  ?  ?Assistance Recommended at Discharge Frequent or constant Supervision/Assistance  ?Patient can return home with the following ? A lot of help with walking and/or transfers;A lot of help with bathing/dressing/bathroom;Assistance with cooking/housework;Help with stairs or ramp for entrance;Direct supervision/assist for medications management;Direct supervision/assist for financial management;Assist for transportation ?  ?Equipment Recommendations ? None recommended by OT  ?  ?Recommendations for Other Services   ? ?  ?Precautions / Restrictions Precautions ?Precautions: Fall ?Restrictions ?Weight Bearing Restrictions: No  ? ? ?  ? ?Mobility Bed Mobility ?Overal bed mobility: Needs Assistance ?Bed Mobility: Supine to Sit ?   ?  ?Supine to sit: Mod assist, HOB elevated ?  ?  ?General bed mobility comments: Mod assist for transfer to edge of bed - needing guidance for LEs and pull up for trunk. ?  ? ?Transfers ?Overall transfer level: Needs assistance ?Equipment used: Rolling walker (2 wheels) ?Transfers: Sit to/from Stand, Bed to chair/wheelchair/BSC ?Sit to Stand: Min assist, Mod assist, +2 physical assistance ?Stand pivot transfers: Mod assist, +2 physical assistance ?  ?  ?  ?  ?General transfer comment: Multiple sit to stands. From elevated bed patient min x 2 with walker. From recliner x 2 with walker - mod assist to stand. To practice standing/marching with stedy patient min assist x 1 when pulling up with stedy bar. In regards to step pivot - patient needing assistance to weight shift and assistance to advance both left and right foot. He was only able to one good step without physical assistance. Worked on standing and attempting to march with use of stedy - and patient unabel to lift feet - he is not wanting to shift weight to RLE to functional step. ?  ?  ?Balance Overall balance assessment: Needs assistance ?Sitting-balance support: No upper extremity supported, Feet supported ?Sitting balance-Leahy Scale: Fair ?  ?  ?Standing balance support: During functional activity, Reliant on assistive device for balance ?Standing balance-Leahy Scale: Poor ?  ?  ?  ?  ?  ?  ?  ?  ?  ?  ?  ?  ?   ? ?ADL either performed or assessed with clinical judgement  ? ?ADL   ?  ?  ?  ?  ?  ?  ?  ?  ?  ?  ?  ?  ?  ?  ?  ?  ?  ?  ?  ?  ?  ? ?  Extremity/Trunk Assessment   ?  ?  ?  ?  ?  ? ?Vision   ?Vision Assessment?: No apparent visual deficits ?  ?Perception   ?  ?Praxis   ?  ? ?Cognition Arousal/Alertness: Awake/alert ?Behavior During Therapy: [facility] for tasks assessed/performed ?Overall Cognitive Status: No family/caregiver present to determine baseline cognitive functioning ?  ?  ?  ?  ?  ?  ?  ?  ?  ?  ?  ?  ?Following Commands: Follows one step  commands consistently ?  ?  ?  ?  ?  ?  ?   ?Exercises   ? ?  ?Shoulder Instructions   ? ? ?  ?General Comments    ? ? ?Pertinent Vitals/ Pain       Pain Assessment ?Pain Assessment: No/denies pain ? ?Home Living   ?  ?  ?  ?  ?  ?  ?  ?  ?  ?  ?  ?  ?  ?  ?  ?  ?  ?  ? ?  ?Prior Functioning/Environment    ?  ?  ?  ?   ? ?Frequency ? Min 2X/week  ? ? ? ? ?  ?Progress Toward Goals ? ?OT Goals(current goals can now be found in the care plan section) ? Progress towards OT goals: Progressing toward goals ? ?Acute Rehab OT Goals ?Patient Stated Goal: walk better ?OT Goal Formulation: With patient ?Time For Goal Achievement: 09/18/21 ?Potential to Achieve Goals: Good  ?Plan Discharge plan remains appropriate   ? ?Co-evaluation ? ? ?   ?  ?  ?  ?  ? ?  ?AM-PAC OT "6 Clicks" Daily Activity     ?Outcome Measure ? ? Help from another person eating meals?: A Little ?Help from another person taking care of personal grooming?: A Little ?Help from another person toileting, which includes using toliet, bedpan, or urinal?: A Lot ?Help from another person bathing (including washing, rinsing, drying)?: A Lot ?Help from another person to put on and taking off regular upper body clothing?: A Lot ?Help from another person to put on and taking off regular lower body clothing?: A Lot ?6 Click Score: 14 ? ?  ?End of Session Equipment Utilized During Treatment: Rolling walker (2 wheels);Gait belt ? ?OT Visit Diagnosis: Unsteadiness on feet (R26.81);Muscle weakness (generalized) (M62.81);Other abnormalities of gait and mobility (R26.89) ?  ?Activity Tolerance Patient tolerated treatment well ?  ?Patient Left in bed;with call bell/phone within reach;with nursing/sitter in room ?  ?Nurse Communication Mobility status ?  ? ?   ? ?Time: 7588-3254 ?OT Time Calculation (min): 19 min ? ?Charges: OT General Charges ?$OT Visit: 1 Visit ?OT Treatments ?$Therapeutic Activity: 8-22 mins ? ?Rosella Crandell, OTR/L ?Acute Care Rehab Services  ?Office  [phone] ?Pager: [phone]  ? ?Mishel Sans L Zakyah Yanes ?09/06/2021, 4:26 PM ?

## 2021-09-06 NOTE — TOC Progression Note (Addendum)
Transition of Care (TOC) - Progression Note  ? ? ?Patient Details  ?Name: Benjamin Willis. ?MRN: [id number] ?Date of Birth: 04-Nov-1945 ? ?Transition of Care (TOC) CM/SW Contact  ?Abdinasir Spadafore, Marjie Skiff, RN ?Phone Number: ?09/06/2021, 12:29 PM ? ?Clinical Narrative:    ?Per Claiborne Billings at [employer] still awaiting insurance auth from [place].  ? ? ?Expected Discharge Plan: [place] ?Barriers to Discharge: Continued Medical Work up ? ?Expected Discharge Plan and Services ?Expected Discharge Plan: [place] ?  ?Discharge Planning Services: CM Consult ?  ?Living arrangements for the past 2 months: [place] ?                ?  ?Readmission Risk Interventions ? ?  09/04/2021  ?  1:53 PM 07/05/2021  ?  1:02 PM 01/10/2021  ?  9:52 AM  ?Readmission Risk Prevention Plan  ?Transportation Screening Complete Complete Complete  ?PCP or Specialist Appt within 3-5 Days Complete Complete Complete  ?[place] or Home Care Consult Complete Complete Complete  ?Social Work Consult for [place] Planning/Counseling Complete Complete Complete  ?Palliative Care Screening Not Applicable Complete Not Applicable  ?Medication Review Press photographer) Complete Complete Complete  ? ? ?

## 2021-09-06 NOTE — Care Management Important Message (Signed)
Important Message ? ?Patient Details IM Letter placed in Patients room. ?Name: Benjamin Willis. ?MRN: [id number] ?Date of Birth: Aug 16, 1945 ? ? ?Medicare Important Message Given:  Yes ? ? ? ? ?Kerin Salen ?09/06/2021, 11:00 AM ?

## 2021-09-06 NOTE — Progress Notes (Addendum)
?      ? ? ? [facility] ?                                                                           ? ? ?Benjamin Willis, is a 76 y.o. male, DOB - 1946-04-10, ELF:[id number] ?Admit date - 09/02/2021    ?Outpatient Primary MD for the patient is [name], MD ? ?LOS - 4  days ? ?Chief Complaint  ?Patient presents with  ? Weakness  ?    ? ?Brief summary  ? ?Patient is a 76 year old male with diabetes mellitus, HTN, HLD, NSCLC presented with generalized weakness.  Patient has had increased urinary frequency, incontinence over the last week prior to admission.  He had seen progressively weaker during this time.  Patient had a chemo session 3 days ago and since that time his appetite has been poor.  On the morning of admission, patient's sister found the patient on the floor next to his bed.  He reported that he was trying to get some underwear on and fell.  He did not hit his head or had any loss of consciousness.  EMS was called. ? ? ?Assessment & Plan  ? ? ?Principal Problem: ?  UTI (urinary tract infection) ?-Urine culture showed less than 10,000 colonies of insignificant growth ?-Patient was placed on IV Rocephin, has received 3 doses ? ? ?Active Problems: ?  ?Generalized weakness, fall ?-PT OT consulted, recommended SNF for rehab, awaiting bed ? ?Acute metabolic encephalopathy ?- Patient was noted to be lethargic, garbled speech and question of focal weakness on 5/3.   ?-Stat CT head showed no significant change from 09/02/2021 CT.  Multiple treated metastatic brain lesions, no acute intracranial process. ?-DC IV fluids, obtain BNP, swallow evaluation ? 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 ?Neutropenia secondary to chemotherapy ?-CBC noted WBC count of 0.6-> 0.7 ?- d/w Dr Burr Medico, on-call oncology, patient is undergoing chemotherapy, last received on 4/28, had not received Granix ?-Recommended 1 dose of Granix,  does not need to have WBC count normalized prior to discharge as this is neutropenia due to chemotherapy ? ?Diabetes mellitus type 2, NIDDM, uncontrolled with hyperglycemia ?- hold Jardiance and metformin, continue sliding scale insulin while inpatient ?-Hemoglobin A1c 10.6 ? ?Hyperlipidemia ?-Continue statin ? ?Hypertension, tachycardia ?-Continue Lopressor 25 mg twice daily ? ? ?Code Status: Full code ?DVT Prophylaxis:  SCDs Start: 09/02/21 1527 ? ? ?Level of Care: Level of care: Telemetry ?Family Communication: Updated patient's sister on the phone on 5/4 ? ?Disposition Plan:      Remains inpatient appropriate: Generalized debility, needs SNF, authorization started ? ? ?Procedures:  ?None ? ?Consultants:   ?None ? ?Antimicrobials: None ? ? ?Medications ? dexamethasone  4 mg Oral H85I  ? folic acid  1 mg Oral Daily  ? gabapentin  100 mg Oral BID  ? insulin aspart  0-15 Units Subcutaneous TID WC  ? insulin aspart  0-5 Units Subcutaneous QHS  ? latanoprost  1 drop Both Eyes QHS  ? levETIRAcetam  1,000 mg Oral BID  ? metoprolol tartrate  25 mg Oral BID  ? pantoprazole  40 mg Oral QAC breakfast  ? rosuvastatin  20 mg Oral Daily  ? Tbo-filgastrim (GRANIX) SQ  300 mcg Subcutaneous ONCE-1800  ? ? ? ? ?Subjective:  ? ?Benjamin Willis was seen and examined today.  No acute complaints from the patient except generalized weakness.  No fevers or chills.  No other infectious signs  ? ? ?Objective:  ? ?Vitals:  ? 09/05/21 1329 09/05/21 2038 09/06/21 0407 09/06/21 1321  ?BP: 136/84 (!) 145/92 (!) 160/90 127/82  ?Pulse: 77 82 86 80  ?Resp: 18 18 18 17   ?Temp: 97.7 ?F (36.5 ?C) (!) 97.3 ?F (36.3 ?C) 97.8 ?F (36.6 ?C) 98.5 ?F (36.9 ?C)  ?TempSrc: Oral Oral Oral Oral  ?SpO2: 99% 96% 97% 97%  ? ? ?Intake/Output Summary (Last 24 hours) at 09/06/2021 1550 ?Last data filed at 09/06/2021 0900 ?Gross per 24 hour  ?Intake --  ?Output 1100 ml  ?Net -1100 ml  ? ? ? ?Wt Readings from Last 3 Encounters:  ?08/30/21 62.2 kg  ?08/20/21 68.2 kg   ?08/09/21 70.2 kg  ? ?Physical Exam ?General: Alert and oriented x 3, NAD ?Cardiovascular: S1 S2 clear, RRR. No pedal edema b/l ?Respiratory: CTAB, no wheezing, rales or rhonchi ?Gastrointestinal: Soft, nontender, nondistended, NBS ?Ext: no pedal edema bilaterally ?Neuro: no new deficits ?Psych: Normal affect and demeanor, alert and oriented x3  ? ? ?Data Reviewed:  I have personally reviewed following labs  ? ? ?CBC ?Lab Results  ?Component Value Date  ? WBC 0.7 (LL) 09/06/2021  ? RBC 3.75 (L) 09/06/2021  ? HGB 12.2 (L) 09/06/2021  ? HCT 36.1 (L) 09/06/2021  ? MCV 96.3 09/06/2021  ? MCH 32.5 09/06/2021  ? PLT 179 09/06/2021  ? MCHC 33.8 09/06/2021  ? RDW 13.9 09/06/2021  ? LYMPHSABS 0.4 (L) 09/06/2021  ? MONOABS 0.1 09/06/2021  ? EOSABS 0.0 09/06/2021  ? BASOSABS 0.0 09/06/2021  ? ? ? ?Last metabolic panel ?Lab Results  ?Component Value Date  ? NA 142 09/06/2021  ? K 3.5 09/06/2021  ? CL 109 09/06/2021  ? CO2 24 09/06/2021  ? BUN 9 09/06/2021  ? CREATININE 0.63 09/06/2021  ? GLUCOSE 95 09/06/2021  ? GFRNONAA >60 09/06/2021  ? GFRAA 101 12/06/2019  ? CALCIUM 8.3 (L) 09/06/2021  ? PHOS 4.1 07/09/2021  ? PROT 6.0 (L) 09/03/2021  ? ALBUMIN 3.1 (L) 09/03/2021  ? LABGLOB 2.3 12/06/2019  ? AGRATIO 1.8 12/06/2019  ? BILITOT 1.4 (H) 09/03/2021  ? ALKPHOS 72 09/03/2021  ? AST 27 09/03/2021  ? ALT 13 09/03/2021  ? ANIONGAP 9 09/06/2021  ? ? ?CBG (last 3)  ?Recent Labs  ?  09/05/21 ?2134 09/06/21 ?2440 09/06/21 ?1137  ?GLUCAP 147* 97 176*  ?  ? ? ? ? ? ?Radiology Studies: I have personally reviewed the imaging studies  ?CT HEAD WO CONTRAST (5MM) ? ?Result Date: 09/04/2021 ? IMPRESSION: 1. No significant change from 09/02/2021. 2. Multiple treated metastatic brain lesions. 3. No acute intracranial hemorrhage. No acute intracranial process is seen. Electronically Signed   By: Yvonne Kendall M.D.   On: 09/04/2021 11:48   ? ? ? ? ?Estill Cotta M.D. ?[facility] ?09/06/2021, 3:50 PM ? ?Available via Epic secure chat  7am-7pm ?After 7 pm, please refer to night coverage provider listed on amion. ? ? ? ?

## 2021-09-07 DIAGNOSIS — N390 Urinary tract infection, site not specified: Secondary | ICD-10-CM | POA: Diagnosis not present

## 2021-09-07 DIAGNOSIS — E1169 Type 2 diabetes mellitus with other specified complication: Secondary | ICD-10-CM | POA: Diagnosis not present

## 2021-09-07 DIAGNOSIS — W19XXXA Unspecified fall, initial encounter: Secondary | ICD-10-CM | POA: Diagnosis not present

## 2021-09-07 DIAGNOSIS — E119 Type 2 diabetes mellitus without complications: Secondary | ICD-10-CM | POA: Diagnosis not present

## 2021-09-07 LAB — GLUCOSE, CAPILLARY
Glucose-Capillary: 130 mg/dL — ABNORMAL HIGH (ref 70–99)
Glucose-Capillary: 157 mg/dL — ABNORMAL HIGH (ref 70–99)
Glucose-Capillary: 190 mg/dL — ABNORMAL HIGH (ref 70–99)
Glucose-Capillary: 138 mg/dL — ABNORMAL HIGH (ref 70–99)
Glucose-Capillary: 125 mg/dL — ABNORMAL HIGH (ref 70–99)

## 2021-09-07 LAB — CBC WITH DIFFERENTIAL/PLATELET
Abs Immature Granulocytes: 0.01 10*3/uL (ref 0.00–0.07)
Basophils Absolute: 0 10*3/uL (ref 0.0–0.1)
Basophils Relative: 0 %
Eosinophils Absolute: 0 10*3/uL (ref 0.0–0.5)
Eosinophils Relative: 1 %
HCT: 32.5 % — ABNORMAL LOW (ref 39.0–52.0)
Hemoglobin: 10.9 g/dL — ABNORMAL LOW (ref 13.0–17.0)
Immature Granulocytes: 1 %
Lymphocytes Relative: 36 %
Lymphs Abs: 0.4 10*3/uL — ABNORMAL LOW (ref 0.7–4.0)
MCH: 32.2 pg (ref 26.0–34.0)
MCHC: 33.5 g/dL (ref 30.0–36.0)
MCV: 96.2 fL (ref 80.0–100.0)
Monocytes Absolute: 0.1 10*3/uL (ref 0.1–1.0)
Monocytes Relative: 7 %
Neutro Abs: 0.5 10*3/uL — ABNORMAL LOW (ref 1.7–7.7)
Neutrophils Relative %: 55 %
Platelets: 136 10*3/uL — ABNORMAL LOW (ref 150–400)
RBC: 3.38 MIL/uL — ABNORMAL LOW (ref 4.22–5.81)
RDW: 14.1 % (ref 11.5–15.5)
WBC: 1 10*3/uL — CL (ref 4.0–10.5)
nRBC: 0 % (ref 0.0–0.2)

## 2021-09-07 NOTE — TOC Progression Note (Signed)
Transition of Care (TOC) - Progression Note  ? ? ?Patient Details  ?Name: Benjamin Willis. ?MRN: [id number] ?Date of Birth: [birth year] ? ?Transition of Care (TOC) CM/SW Contact  ?Tawanna Cooler, RN ?Phone Number: ?09/07/2021, 3:55 PM ? ?Clinical Narrative:    ? ?Messaged admissions rep at [facility].  Rep states insurance Josem Kaufmann is still pending at this time.  ? ? ? ?Expected Discharge Plan: [place] ?Barriers to Discharge: Continued Medical Work up ? ?Expected Discharge Plan and Services ?Expected Discharge Plan: [place] ?  ?Discharge Planning Services: CM Consult ?  ?Living arrangements for the past 2 months: [place] ?                ?  ?  ?  ?  ? ?

## 2021-09-07 NOTE — Evaluation (Signed)
Clinical/Bedside Swallow Evaluation ?Patient Details  ?Name: Benjamin Willis. ?MRN: [id number] ?Date of Birth: Dec 04, 1945 ? ?Today's Date: 09/07/2021 ?Time: SLP Start Time (ACUTE ONLY): 5809 SLP Stop Time (ACUTE ONLY): 9833 ?SLP Time Calculation (min) (ACUTE ONLY): 25 min ? ?Past Medical History:  ?Past Medical History:  ?Diagnosis Date  ? Allergy   ? Diabetes mellitus without complication ([place])   ? Type II  ? Glaucoma 10/16/2020  ? History of radiation therapy   ? Thorax- SBRT 02/21/19-02/28/19, Rt Lung- IMRT 02/29/20-04/16/20 Dr. Gery Pray  ? History of radiation therapy 10/30/2020  ? whole brain 10/17/2020-10/30/2020 Dr Gery Pray  ? Hyperlipidemia   ? Hypertension   ? Lung cancer ([place])   ? Stage IA2 (cT1b, N0) non-small cell lung cancer (adenocarcinoma)  ? ?Past Surgical History:  ?Past Surgical History:  ?Procedure Laterality Date  ? ELECTROMAGNETIC NAVIGATION BROCHOSCOPY Right 02/02/2019  ? Procedure: VIDEO BRONCHOSCOPY WITH NAVIGATION AND FIDUCIAL PLACEMENT;  Surgeon: Garner Nash, DO;  Location: [place];  Service: Cardiopulmonary;  Laterality: Right;  ? FUDUCIAL PLACEMENT Right 02/02/2019  ? Procedure: Placement Of Fuducial Right Upper Lobe;  Surgeon: Garner Nash, DO;  Location: [place];  Service: Cardiopulmonary;  Laterality: Right;  ? IR IMAGING GUIDED PORT INSERTION  12/13/2020  ? none    ? VIDEO BRONCHOSCOPY WITH RADIAL ENDOBRONCHIAL ULTRASOUND Right 02/02/2019  ? Procedure: Video Bronchoscopy With Radial Endobronchial Ultrasound;  Surgeon: Garner Nash, DO;  Location: [place];  Service: Cardiopulmonary;  Laterality: Right;  ? ?HPI:  ?76 y.o. male presenting with  weakness, fall, admitted wtih UTI. CTH: No new, progressive or worsening findings. Treated lesions affecting the cerebellum and cerebral hemispheres as above without evidence of progression or change. PMH: DM2, HTN, HLD, NSCLC with brain mets, whole brain XRT  ?  ?Assessment / Plan / Recommendation  ?Clinical Impression ? Patient presents  with clinical s/s of dysphagia as per this bedside/clinical swallow evaluation. Initially, he was very lethargic, slow to respond, slow to initiate and speech very dysarthric. SLP started session by feeding patient and holding cup of liquids as he drank via straw sips, however patient improved during session and by end he was picking up cup on his own and drinking, picking up his eye glasses and putting them on and his speech improved signifcantly from being very dysarthric to clear (still slow). SLP did observe patient to have a couple instances of delayed cough response when eating solids and liquids and suspect this is due to swallow initiation delays. Voice was initially low in intensity and with some congestion but this improved and remained clear throughout majority of session. SLP is recommending downgrade patient's diet to Dys 3 solids, thin liquids and plan for SLP to f/u next 1-2 dates to ensure toleration. ?SLP Visit Diagnosis: Dysphagia, unspecified (R13.10) ?   ?Aspiration Risk ? Mild aspiration risk  ?  ?Diet Recommendation Dysphagia 3 (Mech soft);Thin liquid  ? ?Liquid Administration via: Straw;Cup ?Medication Administration: Whole meds with liquid ?Supervision: Patient able to self feed;Staff to assist with self feeding ?Compensations: Small sips/bites ?Postural Changes: Seated upright at 90 degrees  ?  ?Other  Recommendations Oral Care Recommendations: Oral care BID   ? ?Recommendations for follow up therapy are one component of a multi-disciplinary discharge planning process, led by the attending physician.  Recommendations may be updated based on patient status, additional functional criteria and insurance authorization. ? ?Follow up Recommendations Home health SLP  ? ? ?  ?Assistance Recommended at Discharge Frequent or  constant Supervision/Assistance  ?Functional Status Assessment Patient has had a recent decline in their functional status and demonstrates the ability to make significant  improvements in function in a reasonable and predictable amount of time.  ?Frequency and Duration min 2x/week  ?2 weeks ?  ?   ? ?Prognosis Prognosis for Safe Diet Advancement: Good  ? ?  ? ?Swallow Study   ?General Date of Onset: 09/06/21 ?HPI: 76 y.o. male presenting with  weakness, fall, admitted wtih UTI. CTH: No new, progressive or worsening findings. Treated lesions affecting the cerebellum and cerebral hemispheres as above without evidence of progression or change. PMH: DM2, HTN, HLD, NSCLC with brain mets, whole brain XRT ?Type of Study: Bedside Swallow Evaluation ?Previous Swallow Assessment: none found ?Diet Prior to this Study: Regular;Thin liquids ?Temperature Spikes Noted: No ?Respiratory Status: Room air ?History of Recent Intubation: No ?Behavior/Cognition: Alert;Cooperative;Pleasant mood ?Oral Cavity Assessment: Within Functional Limits ?Oral Care Completed by SLP: No ?Oral Cavity - Dentition: Adequate natural dentition ?Vision: Functional for self-feeding ?Self-Feeding Abilities: Able to feed self;Needs set up ?Patient Positioning: Upright in bed ?Baseline Vocal Quality: Low vocal intensity ?Volitional Cough: Congested ?Volitional Swallow: Able to elicit  ?  ?Oral/Motor/Sensory Function Overall Oral Motor/Sensory Function: Generalized oral weakness   ?Ice Chips     ?Thin Liquid Thin Liquid: Impaired ?Presentation: Straw ?Oral Phase Functional Implications: Prolonged oral transit ?Pharyngeal  Phase Impairments: Suspected delayed Swallow;Cough - Delayed  ?  ?Nectar Thick     ?Honey Thick     ?Puree Puree: Not tested   ?Solid ? ? ?  Solid: Impaired ?Oral Phase Functional Implications: Prolonged oral transit;Impaired mastication ?Pharyngeal Phase Impairments: Suspected delayed Swallow;Cough - Delayed  ? ?  ? ?Sonia Baller, MA, CCC-SLP ?Speech Therapy ? ? ? ? ?

## 2021-09-07 NOTE — Progress Notes (Signed)
?      ? ? ? [facility] ?                                                                           ? ? ?Benjamin Willis, is a 76 y.o. male, DOB - 29-Aug-1945, IPJ:[id number] ?Admit date - 09/02/2021    ?Outpatient Primary MD for the patient is Benjamin Honour Ines Bloomer, MD ? ?LOS - 5  days ? ?Chief Complaint  ?Patient presents with  ? Weakness  ?    ? ?Brief summary  ? ?Patient is a 76 year old male with diabetes mellitus, HTN, HLD, NSCLC presented with generalized weakness.  Patient has had increased urinary frequency, incontinence over the last week prior to admission.  He had seen progressively weaker during this time.  Patient had a chemo session 3 days ago and since that time his appetite has been poor.  On the morning of admission, patient's sister found the patient on the floor next to his bed.  He reported that he was trying to get some underwear on and fell.  He did not hit his head or had any loss of consciousness.  EMS was called. ? ? ?Assessment & Plan  ? ? ?Principal Problem: ?  UTI (urinary tract infection) ?-Urine culture showed less than 10,000 colonies of insignificant growth ?-Patient was placed on IV Rocephin, has received 3 doses ? ? ?Active Problems: ?  ?Generalized weakness, fall ?-PT OT consulted, recommended SNF for rehab, awaiting bed ?-No acute issues ? ?Acute metabolic encephalopathy ?- Patient was noted to be lethargic, garbled speech and question of focal weakness on 5/3.   ?-Stat CT head showed no significant change from 09/02/2021 CT.  Multiple treated metastatic brain lesions, no acute intracranial process. ?-BNP 44.8, IV fluids discontinued, watching TV, mental status close to his baseline ? 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 ?Neutropenia secondary to chemotherapy ?-CBC noted WBC count of 0.6-> 0.7 ?- d/w Dr Burr Medico, on-call oncology, patient is undergoing chemotherapy, last received on 4/28,  had not received Granix ?-Recommended 1 dose of Granix, does not need to have WBC count normalized prior to discharge as this is neutropenia due to chemotherapy ?-WBC count improving ? ?Diabetes mellitus type 2, NIDDM, uncontrolled with hyperglycemia ?- hold Jardiance and metformin, continue sliding scale insulin while inpatient ?-Hemoglobin A1c 10.6 ? ?Hyperlipidemia ?-Continue statin ? ?Hypertension, tachycardia ?-Continue Lopressor 25 mg twice daily ? ? ?Code Status: Full code ?DVT Prophylaxis:  SCDs Start: 09/02/21 1527 ? ? ?Level of Care: Level of care: Telemetry ?Family Communication: Updated patient's sister on the phone today ? ?Disposition Plan:      Remains inpatient appropriate:  ?Per sister, she received a call that his insurance had approved SNF.  Will discuss with TOC if bed is available. ?Procedures:  ?None ? ?Consultants:   ?hematology oncology ?Antimicrobials: None ? ? ?Medications ? dexamethasone  4 mg Oral B34L  ? folic acid  1 mg Oral Daily  ? gabapentin  100 mg Oral BID  ? insulin aspart  0-15 Units Subcutaneous TID WC  ? insulin aspart  0-5 Units Subcutaneous QHS  ? latanoprost  1 drop Both Eyes QHS  ? levETIRAcetam  1,000 mg Oral BID  ? metoprolol tartrate  25 mg Oral BID  ? pantoprazole  40 mg Oral QAC breakfast  ? rosuvastatin  20 mg Oral Daily  ? ? ? ? ?Subjective:  ? ?[name] was seen and examined today.  No acute complaints from the patient.  No fevers or chills, nausea vomiting or abdominal pain. ? ? ?Objective:  ? ?Vitals:  ? 09/06/21 1321 09/06/21 2038 09/07/21 0429 09/07/21 1346  ?BP: 127/82 120/86 137/80 129/77  ?Pulse: 80 87 94 92  ?Resp: 17 18 18 16   ?Temp: 98.5 ?F (36.9 ?C) 98.1 ?F (36.7 ?C) 98.2 ?F (36.8 ?C) 98.6 ?F (37 ?C)  ?TempSrc: Oral Oral Oral Oral  ?SpO2: 97% 95% 95% 95%  ? ? ?Intake/Output Summary (Last 24 hours) at 09/07/2021 1426 ?Last data filed at 09/07/2021 1015 ?Gross per 24 hour  ?Intake 480 ml  ?Output 1050 ml  ?Net -570 ml  ? ? ? ?Wt Readings from Last 3  Encounters:  ?08/30/21 62.2 kg  ?08/20/21 68.2 kg  ?08/09/21 70.2 kg  ? ? ?Physical Exam ?General: Alert and oriented x 3, NAD ?Cardiovascular: S1 S2 clear, RRR.  ?Respiratory: CTAB, no wheezing, rales or rhonchi ?Gastrointestinal: Soft, nontender, nondistended, NBS ?Ext: no pedal edema bilaterally ?Psych: Normal affect  ? ? ?Data Reviewed:  I have personally reviewed following labs  ? ? ?CBC ?Lab Results  ?Component Value Date  ? WBC 1.0 (LL) 09/07/2021  ? RBC 3.38 (L) 09/07/2021  ? HGB 10.9 (L) 09/07/2021  ? HCT 32.5 (L) 09/07/2021  ? MCV 96.2 09/07/2021  ? MCH 32.2 09/07/2021  ? PLT 136 (L) 09/07/2021  ? MCHC 33.5 09/07/2021  ? RDW 14.1 09/07/2021  ? LYMPHSABS 0.4 (L) 09/07/2021  ? MONOABS 0.1 09/07/2021  ? EOSABS 0.0 09/07/2021  ? BASOSABS 0.0 09/07/2021  ? ? ? ?Last metabolic panel ?Lab Results  ?Component Value Date  ? NA 142 09/06/2021  ? K 3.5 09/06/2021  ? CL 109 09/06/2021  ? CO2 24 09/06/2021  ? BUN 9 09/06/2021  ? CREATININE 0.63 09/06/2021  ? GLUCOSE 95 09/06/2021  ? GFRNONAA >60 09/06/2021  ? GFRAA 101 12/06/2019  ? CALCIUM 8.3 (L) 09/06/2021  ? PHOS 4.1 07/09/2021  ? PROT 6.0 (L) 09/03/2021  ? ALBUMIN 3.1 (L) 09/03/2021  ? LABGLOB 2.3 12/06/2019  ? AGRATIO 1.8 12/06/2019  ? BILITOT 1.4 (H) 09/03/2021  ? ALKPHOS 72 09/03/2021  ? AST 27 09/03/2021  ? ALT 13 09/03/2021  ? ANIONGAP 9 09/06/2021  ? ? ?CBG (last 3)  ?Recent Labs  ?  09/06/21 ?2042 09/07/21 ?0747 09/07/21 ?1140  ?GLUCAP 185* 130* 157*  ?  ? ? ? ? ? ?Radiology Studies: I have personally reviewed the imaging studies  ?CT HEAD WO CONTRAST (5MM) ? ?Result Date: 09/04/2021 ? IMPRESSION: 1. No significant change from 09/02/2021. 2. Multiple treated metastatic brain lesions. 3. No acute intracranial hemorrhage. No acute intracranial process is seen. Electronically Signed   By: Yvonne Kendall M.D.   On: 09/04/2021 11:48   ? ? ? ? ?Estill Cotta M.D. ?[facility] ?09/07/2021, 2:26 PM ? ?Available via Epic secure chat 7am-7pm ?After 7 pm, please refer  to night coverage provider listed on amion. ? ? ? ?

## 2021-09-08 DIAGNOSIS — W19XXXA Unspecified fall, initial encounter: Secondary | ICD-10-CM | POA: Diagnosis not present

## 2021-09-08 DIAGNOSIS — E1169 Type 2 diabetes mellitus with other specified complication: Secondary | ICD-10-CM | POA: Diagnosis not present

## 2021-09-08 DIAGNOSIS — E119 Type 2 diabetes mellitus without complications: Secondary | ICD-10-CM | POA: Diagnosis not present

## 2021-09-08 DIAGNOSIS — N390 Urinary tract infection, site not specified: Secondary | ICD-10-CM | POA: Diagnosis not present

## 2021-09-08 LAB — CBC WITH DIFFERENTIAL/PLATELET
Abs Immature Granulocytes: 0.02 10*3/uL (ref 0.00–0.07)
Basophils Absolute: 0 10*3/uL (ref 0.0–0.1)
Basophils Relative: 1 %
Eosinophils Absolute: 0 10*3/uL (ref 0.0–0.5)
Eosinophils Relative: 0 %
HCT: 32.8 % — ABNORMAL LOW (ref 39.0–52.0)
Hemoglobin: 11 g/dL — ABNORMAL LOW (ref 13.0–17.0)
Immature Granulocytes: 1 %
Lymphocytes Relative: 10 %
Lymphs Abs: 0.3 10*3/uL — ABNORMAL LOW (ref 0.7–4.0)
MCH: 32.7 pg (ref 26.0–34.0)
MCHC: 33.5 g/dL (ref 30.0–36.0)
MCV: 97.6 fL (ref 80.0–100.0)
Monocytes Absolute: 0.2 10*3/uL (ref 0.1–1.0)
Monocytes Relative: 7 %
Neutro Abs: 2.7 10*3/uL (ref 1.7–7.7)
Neutrophils Relative %: 81 %
Platelets: 105 10*3/uL — ABNORMAL LOW (ref 150–400)
RBC: 3.36 MIL/uL — ABNORMAL LOW (ref 4.22–5.81)
RDW: 14 % (ref 11.5–15.5)
WBC: 3.3 10*3/uL — ABNORMAL LOW (ref 4.0–10.5)
nRBC: 0 % (ref 0.0–0.2)

## 2021-09-08 LAB — GLUCOSE, CAPILLARY
Glucose-Capillary: 128 mg/dL — ABNORMAL HIGH (ref 70–99)
Glucose-Capillary: 202 mg/dL — ABNORMAL HIGH (ref 70–99)
Glucose-Capillary: 226 mg/dL — ABNORMAL HIGH (ref 70–99)
Glucose-Capillary: 230 mg/dL — ABNORMAL HIGH (ref 70–99)

## 2021-09-08 LAB — BASIC METABOLIC PANEL
Anion gap: 12 (ref 5–15)
BUN: 12 mg/dL (ref 8–23)
CO2: 22 mmol/L (ref 22–32)
Calcium: 8.3 mg/dL — ABNORMAL LOW (ref 8.9–10.3)
Chloride: 107 mmol/L (ref 98–111)
Creatinine, Ser: 0.61 mg/dL (ref 0.61–1.24)
GFR, Estimated: >60 mL/min (ref >60)
Glucose, Bld: 118 mg/dL — ABNORMAL HIGH (ref 70–99)
Potassium: 3.1 mmol/L — ABNORMAL LOW (ref 3.5–5.1)
Sodium: 141 mmol/L (ref 135–145)

## 2021-09-08 MED ORDER — POTASSIUM CHLORIDE CRYS ER 20 MEQ PO TBCR
40.0000 meq | EXTENDED_RELEASE_TABLET | Freq: Once | ORAL | Status: AC
  Administered 2021-09-08: 40 meq via ORAL
  Filled 2021-09-08: qty 2

## 2021-09-08 NOTE — Progress Notes (Signed)
?      ? ? ? [facility] ?                                                                           ? ? ?Benjamin Willis, is a 76 y.o. male, DOB - 01/07/1946, ION:[id number] ?Admit date - 09/02/2021    ?Outpatient Primary MD for the patient is [name], MD ? ?LOS - 6  days ? ?Chief Complaint  ?Patient presents with  ? Weakness  ?    ? ?Brief summary  ? ?Patient is a 76 year old male with diabetes mellitus, HTN, HLD, NSCLC presented with generalized weakness.  Patient has had increased urinary frequency, incontinence over the last week prior to admission.  He had seen progressively weaker during this time.  Patient had a chemo session 3 days ago and since that time his appetite has been poor.  On the morning of admission, patient's sister found the patient on the floor next to his bed.  He reported that he was trying to get some underwear on and fell.  He did not hit his head or had any loss of consciousness.  EMS was called. ? ? ?Assessment & Plan  ? ? ?Principal Problem: ?  UTI (urinary tract infection) ?-Urine culture showed less than 10,000 colonies of insignificant growth ?-Patient was placed on IV Rocephin, has received 3 doses ?-Awaiting skilled nursing facility placement ? ? ?Active Problems: ?  ?Generalized weakness, fall ?-PT OT consulted, recommended SNF for rehab,  ?-Awaiting skilled nursing facility placement, no acute issues ? ?Acute metabolic encephalopathy ?- Patient was noted to be lethargic, garbled speech and question of focal weakness on 5/3.   ?-Stat CT head showed no significant change from 09/02/2021 CT.  Multiple treated metastatic brain lesions, no acute intracranial process. ?-Appears to be close to his baseline ? 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 ?Neutropenia secondary to chemotherapy ?-CBC noted WBC count of 0.6-> 0.7 ?- d/w Dr Burr Medico, on-call oncology, patient is undergoing  chemotherapy, last received on 4/28, had not received Granix ?-Recommended 1 dose of Granix, does not need to have WBC count normalized prior to discharge as this is neutropenia due to chemotherapy ?-WBC count continues to improve ? ?Hypokalemia ?-Replaced ? ?Diabetes mellitus type 2, NIDDM, uncontrolled with hyperglycemia ?- hold Jardiance and metformin, continue sliding scale insulin while inpatient ?-Hemoglobin A1c 10.6 ? ?Hyperlipidemia ?-Continue statin ? ?Hypertension, tachycardia ?-Continue Lopressor 25 mg twice daily ? ? ?Code Status: Full code ?DVT Prophylaxis:  SCDs Start: 09/02/21 1527 ? ? ?Level of Care: Level of care: Telemetry ?Family Communication: Updated patient's sister on the phone on 5/6 ? ?Disposition Plan:      Remains inpatient appropriate: Awaiting skilled nursing placement ? ?Procedures:  ?None ? ?Consultants:   ?hematology oncology ?Antimicrobials: None ? ? ?Medications ? dexamethasone  4 mg Oral K44W  ? folic acid  1 mg Oral Daily  ? gabapentin  100 mg Oral BID  ? insulin aspart  0-15 Units Subcutaneous TID WC  ? insulin aspart  0-5 Units Subcutaneous QHS  ? latanoprost  1 drop Both Eyes QHS  ? levETIRAcetam  1,000 mg Oral BID  ? metoprolol tartrate  25 mg Oral BID  ? pantoprazole  40 mg Oral QAC breakfast  ? rosuvastatin  20 mg Oral Daily  ? ? ? ? ?Subjective:  ? ?Benjamin Willis was seen and examined today.  No acute complaints.  Watching TV.   ? ?Objective:  ? ?Vitals:  ? 09/07/21 0429 09/07/21 1346 09/07/21 2007 09/08/21 0415  ?BP: 137/80 129/77 113/83 138/87  ?Pulse: 94 92 91 90  ?Resp: 18 16 18 18   ?Temp: 98.2 ?F (36.8 ?C) 98.6 ?F (37 ?C) 98.2 ?F (36.8 ?C) 98.2 ?F (36.8 ?C)  ?TempSrc: Oral Oral Oral Oral  ?SpO2: 95% 95% 95% 96%  ? ? ?Intake/Output Summary (Last 24 hours) at 09/08/2021 1242 ?Last data filed at 09/08/2021 1214 ?Gross per 24 hour  ?Intake 840 ml  ?Output 1300 ml  ?Net -460 ml  ? ? ? ?Wt Readings from Last 3 Encounters:  ?08/30/21 62.2 kg  ?08/20/21 68.2 kg  ?08/09/21 70.2 kg   ? ?Physical Exam ?General: Alert and oriented, appears close to his baseline ?Cardiovascular: S1 S2 clear, RRR.  ?Respiratory: CTAB, no wheezing, rales or rhonchi ?Gastrointestinal: Soft, nontender, nondistended, NBS ?Ext: no pedal edema bilaterally ?Neuro: no new deficits ? ? ? ?Data Reviewed:  I have personally reviewed following labs  ? ? ?CBC ?Lab Results  ?Component Value Date  ? WBC 3.3 (L) 09/08/2021  ? RBC 3.36 (L) 09/08/2021  ? HGB 11.0 (L) 09/08/2021  ? HCT 32.8 (L) 09/08/2021  ? MCV 97.6 09/08/2021  ? MCH 32.7 09/08/2021  ? PLT 105 (L) 09/08/2021  ? MCHC 33.5 09/08/2021  ? RDW 14.0 09/08/2021  ? LYMPHSABS 0.3 (L) 09/08/2021  ? MONOABS 0.2 09/08/2021  ? EOSABS 0.0 09/08/2021  ? BASOSABS 0.0 09/08/2021  ? ? ? ?Last metabolic panel ?Lab Results  ?Component Value Date  ? NA 141 09/08/2021  ? K 3.1 (L) 09/08/2021  ? CL 107 09/08/2021  ? CO2 22 09/08/2021  ? BUN 12 09/08/2021  ? CREATININE 0.61 09/08/2021  ? GLUCOSE 118 (H) 09/08/2021  ? GFRNONAA >60 09/08/2021  ? GFRAA 101 12/06/2019  ? CALCIUM 8.3 (L) 09/08/2021  ? PHOS 4.1 07/09/2021  ? PROT 6.0 (L) 09/03/2021  ? ALBUMIN 3.1 (L) 09/03/2021  ? LABGLOB 2.3 12/06/2019  ? AGRATIO 1.8 12/06/2019  ? BILITOT 1.4 (H) 09/03/2021  ? ALKPHOS 72 09/03/2021  ? AST 27 09/03/2021  ? ALT 13 09/03/2021  ? ANIONGAP 12 09/08/2021  ? ? ?CBG (last 3)  ?Recent Labs  ?  09/07/21 ?2157 09/08/21 ?0727 09/08/21 ?1143  ?GLUCAP 125* 128* 202*  ?  ? ? ? ? ? ?Radiology Studies: I have personally reviewed the imaging studies  ?CT HEAD WO CONTRAST (5MM) ? ?Result Date: 09/04/2021 ? IMPRESSION: 1. No significant change from 09/02/2021. 2. Multiple treated metastatic brain lesions. 3. No acute intracranial hemorrhage. No acute intracranial process is seen. Electronically Signed   By: Yvonne Kendall M.D.   On: 09/04/2021 11:48   ? ? ? ? ?Estill Cotta M.D. ?[facility] ?09/08/2021, 12:42 PM ? ?Available via Epic secure chat 7am-7pm ?After 7 pm, please refer to night coverage provider listed  on amion. ? ? ? ?

## 2021-09-09 DIAGNOSIS — E785 Hyperlipidemia, unspecified: Secondary | ICD-10-CM | POA: Diagnosis not present

## 2021-09-09 DIAGNOSIS — E119 Type 2 diabetes mellitus without complications: Secondary | ICD-10-CM | POA: Diagnosis not present

## 2021-09-09 DIAGNOSIS — E1169 Type 2 diabetes mellitus with other specified complication: Secondary | ICD-10-CM | POA: Diagnosis not present

## 2021-09-09 DIAGNOSIS — W19XXXA Unspecified fall, initial encounter: Secondary | ICD-10-CM | POA: Diagnosis not present

## 2021-09-09 LAB — CBC WITH DIFFERENTIAL/PLATELET
Abs Immature Granulocytes: 0.04 10*3/uL (ref 0.00–0.07)
Basophils Absolute: 0 10*3/uL (ref 0.0–0.1)
Basophils Relative: 0 %
Eosinophils Absolute: 0 10*3/uL (ref 0.0–0.5)
Eosinophils Relative: 0 %
HCT: 30.9 % — ABNORMAL LOW (ref 39.0–52.0)
Hemoglobin: 10.8 g/dL — ABNORMAL LOW (ref 13.0–17.0)
Immature Granulocytes: 1 %
Lymphocytes Relative: 8 %
Lymphs Abs: 0.4 10*3/uL — ABNORMAL LOW (ref 0.7–4.0)
MCH: 33.3 pg (ref 26.0–34.0)
MCHC: 35 g/dL (ref 30.0–36.0)
MCV: 95.4 fL (ref 80.0–100.0)
Monocytes Absolute: 0.3 10*3/uL (ref 0.1–1.0)
Monocytes Relative: 7 %
Neutro Abs: 4 10*3/uL (ref 1.7–7.7)
Neutrophils Relative %: 84 %
Platelets: 78 10*3/uL — ABNORMAL LOW (ref 150–400)
RBC: 3.24 MIL/uL — ABNORMAL LOW (ref 4.22–5.81)
RDW: 13.8 % (ref 11.5–15.5)
WBC: 4.8 10*3/uL (ref 4.0–10.5)
nRBC: 0 % (ref 0.0–0.2)

## 2021-09-09 LAB — GLUCOSE, CAPILLARY
Glucose-Capillary: 187 mg/dL — ABNORMAL HIGH (ref 70–99)
Glucose-Capillary: 233 mg/dL — ABNORMAL HIGH (ref 70–99)
Glucose-Capillary: 190 mg/dL — ABNORMAL HIGH (ref 70–99)
Glucose-Capillary: 168 mg/dL — ABNORMAL HIGH (ref 70–99)

## 2021-09-09 NOTE — Progress Notes (Signed)
Physical Therapy Treatment ?Patient Details ?Name: Benjamin Willis. ?MRN: [id number] ?DOB: 1945-06-04 ?Today's Date: 09/09/2021 ? ? ?History of Present Illness 76 y.o. male presenting with  weakness, fall, admitted wtih UTI. CTH: No new, progressive or worsening findings. Treated lesions affecting the cerebellum and cerebral hemispheres as above without evidence of progression or change. PMH: DM2, HTN, HLD, NSCLC with brain mets, whole brain XRT. ? ?  ?PT Comments  ? ? Pt progressing well this session, strength and activity tolerance improving. Pt is much more alert than last PT session, motivated and cooperative. Will benefit from SNF post acute    ?Recommendations for follow up therapy are one component of a multi-disciplinary discharge planning process, led by the attending physician.  Recommendations may be updated based on patient status, additional functional criteria and insurance authorization. ? ?Follow Up Recommendations ? Skilled nursing-short term rehab (<3 hours/day) ?  ?  ?Assistance Recommended at Discharge Frequent or constant Supervision/Assistance  ?Patient can return home with the following Help with stairs or ramp for entrance;A lot of help with walking and/or transfers;A lot of help with bathing/dressing/bathroom;Assist for transportation;Direct supervision/assist for financial management;Assistance with cooking/housework;Direct supervision/assist for medications management ?  ?Equipment Recommendations ? None recommended by PT  ?  ?Recommendations for Other Services   ? ? ?  ?Precautions / Restrictions Precautions ?Precautions: Fall ?Restrictions ?Weight Bearing Restrictions: No  ?  ? ?Mobility ? Bed Mobility ?  ?  ?  ?  ?  ?  ?  ?General bed mobility comments: received in chair ?  ? ?Transfers ?Overall transfer level: Needs assistance ?Equipment used: Rolling walker (2 wheels) ?Transfers: Sit to/from Stand ?Sit to Stand: Min assist, Mod assist, +2 physical assistance, +2 safety/equipment, Min  guard ?  ?  ?  ?  ?  ?General transfer comment: repeated STS for instruction, strengthening and endurance. pt required decr assist on each standing trial only needing min/guard assist on last trial.  multi-modal cues for incr knee flexion and hand placement/for trunk/knee/hip extension once standing ?  ? ?Ambulation/Gait ?Ambulation/Gait assistance: Mod assist, Max assist, +2 physical assistance, +2 safety/equipment ?Gait Distance (Feet): 2 Feet (x2) ?Assistive device: Rolling walker (2 wheels) ?Gait Pattern/deviations: Step-to pattern, Trunk flexed ?  ?  ?Pre-gait activities: marching with visual target bil LEs ?General Gait Details: pt requiring assist to balance, advance bil LEs, wt shift, maintain correct RW position ? ? ?Stairs ?  ?  ?  ?  ?  ? ? ?Wheelchair Mobility ?  ? ?Modified Rankin (Stroke Patients Only) ?  ? ? ?  ?Balance Overall balance assessment: Needs assistance ?Sitting-balance support: No upper extremity supported, Feet supported ?Sitting balance-Leahy Scale: Fair ?  ?  ?Standing balance support: During functional activity, Reliant on assistive device for balance ?Standing balance-Leahy Scale: Poor ?Standing balance comment: reliant on UE support and external assist ?  ?  ?  ?  ?  ?  ?  ?  ?  ?  ?  ?  ? ?  ?Cognition Arousal/Alertness: Awake/alert ?Behavior During Therapy: [facility] for tasks assessed/performed ?Overall Cognitive Status: No family/caregiver present to determine baseline cognitive functioning ?Area of Impairment: Following commands ?  ?  ?  ?  ?  ?  ?  ?  ?  ?  ?  ?Following Commands: Follows one step commands inconsistently ?  ?  ?  ?  ?  ?  ? ?  ?Exercises   ? ?  ?General Comments   ?  ?  ? ?  Pertinent Vitals/Pain Pain Assessment ?Pain Assessment: No/denies pain  ? ? ?Home Living   ?  ?  ?  ?  ?  ?  ?  ?  ?  ?   ?  ?Prior Function    ?  ?  ?   ? ?PT Goals (current goals can now be found in the care plan section) Acute Rehab PT Goals ?Patient Stated Goal: none stated ?PT Goal  Formulation: Patient unable to participate in goal setting ?Time For Goal Achievement: 09/17/21 ?Potential to Achieve Goals: Good ?Progress towards PT goals: Progressing toward goals ? ?  ?Frequency ? ? ? Min 2X/week ? ? ? ?  ?PT Plan Current plan remains appropriate  ? ? ?Co-evaluation   ?  ?  ?  ?  ? ?  ?AM-PAC PT "6 Clicks" Mobility   ?Outcome Measure ? Help needed turning from your back to your side while in a flat bed without using bedrails?: A Lot ?Help needed moving from lying on your back to sitting on the side of a flat bed without using bedrails?: A Lot ?Help needed moving to and from a bed to a chair (including a wheelchair)?: A Lot ?Help needed standing up from a chair using your arms (e.g., wheelchair or bedside chair)?: Total ?Help needed to walk in hospital room?: Total ?Help needed climbing 3-5 steps with a railing? : Total ?6 Click Score: 9 ? ?  ?End of Session Equipment Utilized During Treatment: Gait belt ?Activity Tolerance: Patient tolerated treatment well ?Patient left: in chair;with call bell/phone within reach;with chair alarm set ?Nurse Communication: Mobility status ?PT Visit Diagnosis: Other abnormalities of gait and mobility (R26.89);Difficulty in walking, not elsewhere classified (R26.2) ?  ? ? ?Time: 5784-6962 ?PT Time Calculation (min) (ACUTE ONLY): 16 min ? ?Charges:  $Therapeutic Activity: 8-22 mins          ?          ? Baxter Flattery, PT ? ?Acute Rehab Dept [facility]) [phone] ?Pager [phone] ? ?09/09/2021 ? ? ? ?Benjamin Willis ?09/09/2021, 12:24 PM ? ?

## 2021-09-09 NOTE — Progress Notes (Signed)
?      ? ? ? [facility] ?                                                                           ? ? ?Benjamin Willis, is a 76 y.o. male, DOB - 26-May-1945, XLK:[id number] ?Admit date - 09/02/2021    ?Outpatient Primary MD for the patient is [name], MD ? ?LOS - 7  days ? ?Chief Complaint  ?Patient presents with  ? Weakness  ?    ? ?Brief summary  ? ?Patient is a 76 year old male with diabetes mellitus, HTN, HLD, NSCLC presented with generalized weakness.  Patient has had increased urinary frequency, incontinence over the last week prior to admission.  He had seen progressively weaker during this time.  Patient had a chemo session 3 days ago and since that time his appetite has been poor.  On the morning of admission, patient's sister found the patient on the floor next to his bed.  He reported that he was trying to get some underwear on and fell.  He did not hit his head or had any loss of consciousness.  EMS was called. ? ? ?Assessment & Plan  ? ? ?Principal Problem: ?  UTI (urinary tract infection) ?-Urine culture showed less than 10,000 colonies of insignificant growth ?-Patient was placed on IV Rocephin, has received 3 doses ?-Awaiting skilled nursing facility placement, currently no acute issues ? ?Active Problems: ?  ?Generalized weakness, fall ?-PT OT consulted, recommended SNF for rehab,  ?-Awaiting skilled nursing facility placement, no acute issues ? ?Acute metabolic encephalopathy ?- Patient was noted to be lethargic, garbled speech and question of focal weakness on 5/3.   ?-Stat CT head showed no significant change from 09/02/2021 CT.  Multiple treated metastatic brain lesions, no acute intracranial process. ?-Fairly alert and oriented, appears at his baseline ? 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 ?Neutropenia secondary to chemotherapy ?-CBC noted WBC count of 0.6-> 0.7 ?- d/w Dr Burr Medico, on-call  oncology, patient is undergoing chemotherapy, last received on 4/28, had not received Granix ?-Received 1 dose of filgrastim on 09/06/2021  ?-WBC count now normalized ? ? ?Diabetes mellitus type 2, NIDDM, uncontrolled with hyperglycemia ?- hold Jardiance and metformin, continue sliding scale insulin while inpatient ?-Hemoglobin A1c 10.6 ?Recent Labs  ?  09/07/21 ?2157 09/08/21 ?0727 09/08/21 ?1143 09/08/21 ?1641 09/08/21 ?2124 09/09/21 ?0723  ?GLUCAP 125* 128* 202* 226* 230* 187*  ?  ? ?Hyperlipidemia ?-Continue statin ? ?Hypertension, tachycardia ?-Continue Lopressor 25 mg twice daily ? ? ?Code Status: Full code ?DVT Prophylaxis:  SCDs Start: 09/02/21 1527 ? ? ?Level of Care: Level of care: Telemetry ?Family Communication: Updated patient's sister on the phone on 5/6 ? ?Disposition Plan:      Remains inpatient appropriate: Awaiting skilled nursing placement pending, SNF authorization ? ?Procedures:  ?None ? ?Consultants:   ?hematology oncology ? ?Antimicrobials: None ? ? ?Medications ? dexamethasone  4 mg Oral D66Y  ? folic acid  1 mg Oral Daily  ? gabapentin  100 mg Oral BID  ? insulin aspart  0-15 Units Subcutaneous TID WC  ? insulin  aspart  0-5 Units Subcutaneous QHS  ? latanoprost  1 drop Both Eyes QHS  ? levETIRAcetam  1,000 mg Oral BID  ? metoprolol tartrate  25 mg Oral BID  ? pantoprazole  40 mg Oral QAC breakfast  ? rosuvastatin  20 mg Oral Daily  ? ? ? ? ?Subjective:  ? ?[name] was seen and examined today.  No acute complaints, watching TV and eating breakfast at the time of my exam.  Awaiting SNF. ? ?Objective:  ? ?Vitals:  ? 09/08/21 2245 09/09/21 0430 09/09/21 0430 09/09/21 1013  ?BP: 119/79 (!) 139/96 (!) 139/96 126/80  ?Pulse: 96 (!) 102 (!) 101   ?Resp:  18 18   ?Temp:  98.2 ?F (36.8 ?C) 98.2 ?F (36.8 ?C)   ?TempSrc:  Oral Oral   ?SpO2:  96% 96%   ? ? ?Intake/Output Summary (Last 24 hours) at 09/09/2021 1147 ?Last data filed at 09/09/2021 0500 ?Gross per 24 hour  ?Intake 480 ml  ?Output 950 ml  ?Net  -470 ml  ? ? ? ?Wt Readings from Last 3 Encounters:  ?08/30/21 62.2 kg  ?08/20/21 68.2 kg  ?08/09/21 70.2 kg  ? ? ?Physical Exam ?General: Alert and oriented x 2 self and place, appears close to his baseline, NAD ?Cardiovascular: S1 S2 clear, RRR. ?Respiratory: CTAB, no wheezing ?Gastrointestinal: Soft, nontender, nondistended, NBS ?Ext: no pedal edema bilaterally ?Neuro: moving all 4 extremities spontaneously ? ? ? ?Data Reviewed:  I have personally reviewed following labs  ? ? ?CBC ?Lab Results  ?Component Value Date  ? WBC 4.8 09/09/2021  ? RBC 3.24 (L) 09/09/2021  ? HGB 10.8 (L) 09/09/2021  ? HCT 30.9 (L) 09/09/2021  ? MCV 95.4 09/09/2021  ? MCH 33.3 09/09/2021  ? PLT 78 (L) 09/09/2021  ? MCHC 35.0 09/09/2021  ? RDW 13.8 09/09/2021  ? LYMPHSABS 0.4 (L) 09/09/2021  ? MONOABS 0.3 09/09/2021  ? EOSABS 0.0 09/09/2021  ? BASOSABS 0.0 09/09/2021  ? ? ? ?Last metabolic panel ?Lab Results  ?Component Value Date  ? NA 141 09/08/2021  ? K 3.1 (L) 09/08/2021  ? CL 107 09/08/2021  ? CO2 22 09/08/2021  ? BUN 12 09/08/2021  ? CREATININE 0.61 09/08/2021  ? GLUCOSE 118 (H) 09/08/2021  ? GFRNONAA >60 09/08/2021  ? GFRAA 101 12/06/2019  ? CALCIUM 8.3 (L) 09/08/2021  ? PHOS 4.1 07/09/2021  ? PROT 6.0 (L) 09/03/2021  ? ALBUMIN 3.1 (L) 09/03/2021  ? LABGLOB 2.3 12/06/2019  ? AGRATIO 1.8 12/06/2019  ? BILITOT 1.4 (H) 09/03/2021  ? ALKPHOS 72 09/03/2021  ? AST 27 09/03/2021  ? ALT 13 09/03/2021  ? ANIONGAP 12 09/08/2021  ? ? ?CBG (last 3)  ?Recent Labs  ?  09/08/21 ?1641 09/08/21 ?2124 09/09/21 ?0723  ?GLUCAP 226* 230* 187*  ?  ? ? ? ? ? ?Radiology Studies: I have personally reviewed the imaging studies  ?CT HEAD WO CONTRAST (5MM) ? ?Result Date: 09/04/2021 ? IMPRESSION: 1. No significant change from 09/02/2021. 2. Multiple treated metastatic brain lesions. 3. No acute intracranial hemorrhage. No acute intracranial process is seen. Electronically Signed   By: Yvonne Kendall M.D.   On: 09/04/2021 11:48   ? ? ? ? ?Estill Cotta M.D. ?[facility] ?09/09/2021, 11:47 AM ? ?Available via Epic secure chat 7am-7pm ?After 7 pm, please refer to night coverage provider listed on amion. ? ? ? ?

## 2021-09-09 NOTE — Care Management Important Message (Signed)
Important Message ? ?Patient Details IM Letter placed in Patients room. ?Name: Benjamin Willis. ?MRN: [id number] ?Date of Birth: August 09, 1945 ? ? ?Medicare Important Message Given:  Yes ? ? ? ? ?Kerin Salen ?09/09/2021, 11:13 AM ?

## 2021-09-09 NOTE — Progress Notes (Signed)
Speech Language Pathology Treatment: Dysphagia  ?Patient Details ?Name: Benjamin Willis. ?MRN: [id number] ?DOB: 23-Mar-1946 ?Today's Date: 09/09/2021 ?Time: 3557-3220 ?SLP Time Calculation (min) (ACUTE ONLY): 15 min ? ?Assessment / Plan / Recommendation ?Clinical Impression ? Patient seen by SLP for skilled treatment session focusing on dysphagia goals. When SLP entered room patient was awake and alert, sitting in recliner and had just finished his breakfast. (ate 100%) He denied any difficulties with chewing or swallowing. His speech is 100% intelligible and SLP not noticing any dysarthria or slowed responses/actions as during initial evaluation on 5/6. SLP observed patient with a few sips of his coffee and no overt s/s aspiration or penetration observed and with swallow initiation being timely. Patient's voice was clear and strong throughout with no observed congestion as was observed during initial evaluation. SLP is recommending to upgrade patient back to regular texture solids at this time and no f/u needed. ? ?  ?HPI HPI: 76 y.o. male presenting with  weakness, fall, admitted wtih UTI. CTH: No new, progressive or worsening findings. Treated lesions affecting the cerebellum and cerebral hemispheres as above without evidence of progression or change. PMH: DM2, HTN, HLD, NSCLC with brain mets, whole brain XRT ?  ?   ?SLP Plan ? All goals met;Discharge SLP treatment due to (comment) ? ?  ?  ?Recommendations for follow up therapy are one component of a multi-disciplinary discharge planning process, led by the attending physician.  Recommendations may be updated based on patient status, additional functional criteria and insurance authorization. ?  ? ?Recommendations  ?Diet recommendations: Regular;Thin liquid ?Liquids provided via: Cup;Straw ?Medication Administration: Whole meds with liquid ?Supervision: Patient able to self feed ?Postural Changes and/or Swallow Maneuvers: Seated upright 90 degrees  ?   ?    ?    ? ? ? ? Oral Care Recommendations: Oral care BID ?Follow Up Recommendations: No SLP follow up ?Assistance recommended at discharge: PRN ?SLP Visit Diagnosis: Dysphagia, unspecified (R13.10) ?Plan: All goals met;Discharge SLP treatment due to (comment) ? ? ? ? ?  ?  ? ? ?Sonia Baller, MA, CCC-SLP ?Speech Therapy ? ?

## 2021-09-10 DIAGNOSIS — I2602 Saddle embolus of pulmonary artery with acute cor pulmonale: Secondary | ICD-10-CM | POA: Diagnosis not present

## 2021-09-10 DIAGNOSIS — M6281 Muscle weakness (generalized): Secondary | ICD-10-CM | POA: Diagnosis not present

## 2021-09-10 DIAGNOSIS — R627 Adult failure to thrive: Secondary | ICD-10-CM | POA: Diagnosis not present

## 2021-09-10 DIAGNOSIS — E860 Dehydration: Secondary | ICD-10-CM | POA: Diagnosis not present

## 2021-09-10 DIAGNOSIS — J80 Acute respiratory distress syndrome: Secondary | ICD-10-CM | POA: Diagnosis not present

## 2021-09-10 DIAGNOSIS — J189 Pneumonia, unspecified organism: Secondary | ICD-10-CM | POA: Diagnosis not present

## 2021-09-10 DIAGNOSIS — B029 Zoster without complications: Secondary | ICD-10-CM | POA: Diagnosis not present

## 2021-09-10 DIAGNOSIS — R066 Hiccough: Secondary | ICD-10-CM | POA: Diagnosis not present

## 2021-09-10 DIAGNOSIS — R6520 Severe sepsis without septic shock: Secondary | ICD-10-CM | POA: Diagnosis not present

## 2021-09-10 DIAGNOSIS — Z7401 Bed confinement status: Secondary | ICD-10-CM | POA: Diagnosis not present

## 2021-09-10 DIAGNOSIS — T451X5A Adverse effect of antineoplastic and immunosuppressive drugs, initial encounter: Secondary | ICD-10-CM | POA: Diagnosis not present

## 2021-09-10 DIAGNOSIS — R5383 Other fatigue: Secondary | ICD-10-CM | POA: Diagnosis not present

## 2021-09-10 DIAGNOSIS — E1169 Type 2 diabetes mellitus with other specified complication: Secondary | ICD-10-CM | POA: Diagnosis not present

## 2021-09-10 DIAGNOSIS — R7989 Other specified abnormal findings of blood chemistry: Secondary | ICD-10-CM | POA: Diagnosis not present

## 2021-09-10 DIAGNOSIS — Z923 Personal history of irradiation: Secondary | ICD-10-CM | POA: Diagnosis not present

## 2021-09-10 DIAGNOSIS — E870 Hyperosmolality and hypernatremia: Secondary | ICD-10-CM | POA: Diagnosis not present

## 2021-09-10 DIAGNOSIS — I2699 Other pulmonary embolism without acute cor pulmonale: Secondary | ICD-10-CM | POA: Diagnosis not present

## 2021-09-10 DIAGNOSIS — R0902 Hypoxemia: Secondary | ICD-10-CM | POA: Diagnosis not present

## 2021-09-10 DIAGNOSIS — L89616 Pressure-induced deep tissue damage of right heel: Secondary | ICD-10-CM | POA: Diagnosis not present

## 2021-09-10 DIAGNOSIS — I10 Essential (primary) hypertension: Secondary | ICD-10-CM | POA: Diagnosis not present

## 2021-09-10 DIAGNOSIS — E119 Type 2 diabetes mellitus without complications: Secondary | ICD-10-CM | POA: Diagnosis not present

## 2021-09-10 DIAGNOSIS — L89626 Pressure-induced deep tissue damage of left heel: Secondary | ICD-10-CM | POA: Diagnosis not present

## 2021-09-10 DIAGNOSIS — D708 Other neutropenia: Secondary | ICD-10-CM | POA: Diagnosis not present

## 2021-09-10 DIAGNOSIS — A419 Sepsis, unspecified organism: Secondary | ICD-10-CM | POA: Diagnosis not present

## 2021-09-10 DIAGNOSIS — C7931 Secondary malignant neoplasm of brain: Secondary | ICD-10-CM | POA: Diagnosis not present

## 2021-09-10 DIAGNOSIS — I2693 Single subsegmental pulmonary embolism without acute cor pulmonale: Secondary | ICD-10-CM | POA: Diagnosis not present

## 2021-09-10 DIAGNOSIS — R531 Weakness: Secondary | ICD-10-CM | POA: Diagnosis not present

## 2021-09-10 DIAGNOSIS — R4182 Altered mental status, unspecified: Secondary | ICD-10-CM | POA: Diagnosis not present

## 2021-09-10 DIAGNOSIS — K219 Gastro-esophageal reflux disease without esophagitis: Secondary | ICD-10-CM | POA: Diagnosis not present

## 2021-09-10 DIAGNOSIS — D72819 Decreased white blood cell count, unspecified: Secondary | ICD-10-CM | POA: Diagnosis not present

## 2021-09-10 DIAGNOSIS — C3411 Malignant neoplasm of upper lobe, right bronchus or lung: Secondary | ICD-10-CM | POA: Diagnosis present

## 2021-09-10 DIAGNOSIS — Z1152 Encounter for screening for COVID-19: Secondary | ICD-10-CM | POA: Diagnosis not present

## 2021-09-10 DIAGNOSIS — W19XXXA Unspecified fall, initial encounter: Secondary | ICD-10-CM | POA: Diagnosis not present

## 2021-09-10 DIAGNOSIS — D6481 Anemia due to antineoplastic chemotherapy: Secondary | ICD-10-CM | POA: Diagnosis not present

## 2021-09-10 DIAGNOSIS — Z7189 Other specified counseling: Secondary | ICD-10-CM | POA: Diagnosis not present

## 2021-09-10 DIAGNOSIS — E785 Hyperlipidemia, unspecified: Secondary | ICD-10-CM | POA: Diagnosis not present

## 2021-09-10 DIAGNOSIS — E1165 Type 2 diabetes mellitus with hyperglycemia: Secondary | ICD-10-CM | POA: Diagnosis not present

## 2021-09-10 DIAGNOSIS — L89312 Pressure ulcer of right buttock, stage 2: Secondary | ICD-10-CM | POA: Diagnosis present

## 2021-09-10 DIAGNOSIS — R112 Nausea with vomiting, unspecified: Secondary | ICD-10-CM | POA: Diagnosis not present

## 2021-09-10 DIAGNOSIS — L89322 Pressure ulcer of left buttock, stage 2: Secondary | ICD-10-CM | POA: Diagnosis not present

## 2021-09-10 DIAGNOSIS — R001 Bradycardia, unspecified: Secondary | ICD-10-CM | POA: Diagnosis not present

## 2021-09-10 DIAGNOSIS — G9341 Metabolic encephalopathy: Secondary | ICD-10-CM | POA: Diagnosis not present

## 2021-09-10 DIAGNOSIS — R7401 Elevation of levels of liver transaminase levels: Secondary | ICD-10-CM | POA: Diagnosis present

## 2021-09-10 DIAGNOSIS — W19XXXD Unspecified fall, subsequent encounter: Secondary | ICD-10-CM | POA: Diagnosis not present

## 2021-09-10 DIAGNOSIS — I248 Other forms of acute ischemic heart disease: Secondary | ICD-10-CM | POA: Diagnosis not present

## 2021-09-10 DIAGNOSIS — Z515 Encounter for palliative care: Secondary | ICD-10-CM | POA: Diagnosis not present

## 2021-09-10 DIAGNOSIS — I82451 Acute embolism and thrombosis of right peroneal vein: Secondary | ICD-10-CM | POA: Diagnosis present

## 2021-09-10 DIAGNOSIS — R1310 Dysphagia, unspecified: Secondary | ICD-10-CM | POA: Diagnosis not present

## 2021-09-10 DIAGNOSIS — C3490 Malignant neoplasm of unspecified part of unspecified bronchus or lung: Secondary | ICD-10-CM | POA: Diagnosis not present

## 2021-09-10 DIAGNOSIS — R0689 Other abnormalities of breathing: Secondary | ICD-10-CM | POA: Diagnosis not present

## 2021-09-10 DIAGNOSIS — R262 Difficulty in walking, not elsewhere classified: Secondary | ICD-10-CM | POA: Diagnosis not present

## 2021-09-10 DIAGNOSIS — R0600 Dyspnea, unspecified: Secondary | ICD-10-CM | POA: Diagnosis not present

## 2021-09-10 DIAGNOSIS — Y92009 Unspecified place in unspecified non-institutional (private) residence as the place of occurrence of the external cause: Secondary | ICD-10-CM | POA: Diagnosis not present

## 2021-09-10 DIAGNOSIS — Z79899 Other long term (current) drug therapy: Secondary | ICD-10-CM | POA: Diagnosis not present

## 2021-09-10 DIAGNOSIS — R000 Tachycardia, unspecified: Secondary | ICD-10-CM | POA: Diagnosis not present

## 2021-09-10 DIAGNOSIS — N390 Urinary tract infection, site not specified: Secondary | ICD-10-CM | POA: Diagnosis not present

## 2021-09-10 DIAGNOSIS — E46 Unspecified protein-calorie malnutrition: Secondary | ICD-10-CM | POA: Diagnosis not present

## 2021-09-10 DIAGNOSIS — E876 Hypokalemia: Secondary | ICD-10-CM | POA: Diagnosis present

## 2021-09-10 DIAGNOSIS — E43 Unspecified severe protein-calorie malnutrition: Secondary | ICD-10-CM | POA: Diagnosis not present

## 2021-09-10 DIAGNOSIS — H409 Unspecified glaucoma: Secondary | ICD-10-CM | POA: Diagnosis not present

## 2021-09-10 DIAGNOSIS — R739 Hyperglycemia, unspecified: Secondary | ICD-10-CM | POA: Diagnosis not present

## 2021-09-10 DIAGNOSIS — R770 Abnormality of albumin: Secondary | ICD-10-CM | POA: Diagnosis not present

## 2021-09-10 DIAGNOSIS — L89153 Pressure ulcer of sacral region, stage 3: Secondary | ICD-10-CM | POA: Diagnosis not present

## 2021-09-10 DIAGNOSIS — G936 Cerebral edema: Secondary | ICD-10-CM | POA: Diagnosis not present

## 2021-09-10 DIAGNOSIS — G4089 Other seizures: Secondary | ICD-10-CM | POA: Diagnosis not present

## 2021-09-10 DIAGNOSIS — R54 Age-related physical debility: Secondary | ICD-10-CM | POA: Diagnosis present

## 2021-09-10 DIAGNOSIS — Z66 Do not resuscitate: Secondary | ICD-10-CM | POA: Diagnosis not present

## 2021-09-10 DIAGNOSIS — J9601 Acute respiratory failure with hypoxia: Secondary | ICD-10-CM | POA: Diagnosis not present

## 2021-09-10 DIAGNOSIS — Z87891 Personal history of nicotine dependence: Secondary | ICD-10-CM | POA: Diagnosis not present

## 2021-09-10 LAB — CBC
HCT: 36.6 % — ABNORMAL LOW (ref 39.0–52.0)
Hemoglobin: 12 g/dL — ABNORMAL LOW (ref 13.0–17.0)
MCH: 32.1 pg (ref 26.0–34.0)
MCHC: 32.8 g/dL (ref 30.0–36.0)
MCV: 97.9 fL (ref 80.0–100.0)
Platelets: 52 10*3/uL — ABNORMAL LOW (ref 150–400)
RBC: 3.74 MIL/uL — ABNORMAL LOW (ref 4.22–5.81)
RDW: 14.3 % (ref 11.5–15.5)
WBC: 2.8 10*3/uL — ABNORMAL LOW (ref 4.0–10.5)
nRBC: 0 % (ref 0.0–0.2)

## 2021-09-10 LAB — BASIC METABOLIC PANEL
Anion gap: 8 (ref 5–15)
BUN: 11 mg/dL (ref 8–23)
CO2: 26 mmol/L (ref 22–32)
Calcium: 8.6 mg/dL — ABNORMAL LOW (ref 8.9–10.3)
Chloride: 103 mmol/L (ref 98–111)
Creatinine, Ser: 0.63 mg/dL (ref 0.61–1.24)
GFR, Estimated: >60 mL/min (ref >60)
Glucose, Bld: 210 mg/dL — ABNORMAL HIGH (ref 70–99)
Potassium: 4.2 mmol/L (ref 3.5–5.1)
Sodium: 137 mmol/L (ref 135–145)

## 2021-09-10 LAB — GLUCOSE, CAPILLARY
Glucose-Capillary: 131 mg/dL — ABNORMAL HIGH (ref 70–99)
Glucose-Capillary: 199 mg/dL — ABNORMAL HIGH (ref 70–99)

## 2021-09-10 MED ORDER — METOPROLOL TARTRATE 25 MG PO TABS: 25.0000 mg | ORAL_TABLET | Freq: Two times a day (BID) | ORAL | Status: DC

## 2021-09-10 NOTE — TOC Transition Note (Signed)
Transition of Care (TOC) - CM/SW Discharge Note ? ? ?Patient Details  ?Name: Benjamin Willis. ?MRN: [id number] ?Date of Birth: [birth year] ? ?Transition of Care (TOC) CM/SW Contact:  ?Taje Tondreau, Marjie Skiff, RN ?Phone Number: ?09/10/2021, 11:21 AM ? ? ?Clinical Narrative:    ?Insurance auth received for SNF by [employer]. Pt to dc there today. Daughter [name] called to inform of transfer. [facility] to transport pt. RN to call report to [phone]. ? ? ?  ?Barriers to Discharge: Continued Medical Work up ?Readmission Risk Interventions ? ?  09/04/2021  ?  1:53 PM 07/05/2021  ?  1:02 PM 01/10/2021  ?  9:52 AM  ?Readmission Risk Prevention Plan  ?Transportation Screening Complete Complete Complete  ?PCP or Specialist Appt within 3-5 Days Complete Complete Complete  ?[place] or Home Care Consult Complete Complete Complete  ?Social Work Consult for [place] Planning/Counseling Complete Complete Complete  ?Palliative Care Screening Not Applicable Complete Not Applicable  ?Medication Review Press photographer) Complete Complete Complete  ? ? ? ? ? ?

## 2021-09-10 NOTE — Progress Notes (Signed)
Report given to [place].  PT[facility] to transport patient now.   ?

## 2021-09-10 NOTE — Discharge Summary (Signed)
?Physician Discharge Summary ?  ?Patient: Benjamin Willis. MRN: [id number] DOB: 11/20/1945  ?Admit date:     09/02/2021  ?Discharge date: 09/10/21  ?Discharge Physician: Estill Cotta, MD   ? ?PCP: Horald Pollen, MD  ? ?Recommendations at discharge:  ? ?Recommend palliative consult at the skilled nursing facility to follow ? ?Discharge Diagnoses: ? ?  UTI (urinary tract infection) ? Generalized debility ?  Acute metabolic encephalopathy ?  Neutropenia resolved ?  Non-small cell lung cancer metastatic to brain [facility]) ?  Fall at home, initial encounter ?  Dyslipidemia associated with type 2 diabetes mellitus ([place]) ?  Hypertension associated with type 2 diabetes mellitus ([place]) ?  Diabetes mellitus type 2 in nonobese [facility]), uncontrolled with hyperglycemia ?   ? ? ? ?Hospital Course: ?Patient is a 76 year old male with diabetes mellitus, HTN, HLD, NSCLC presented with generalized weakness.  Patient has had increased urinary frequency, incontinence over the last week prior to admission.  He had seen progressively weaker during this time.  Patient had a chemo session 3 days ago and since that time his appetite has been poor.  On the morning of admission, patient's sister found the patient on the floor next to his bed.  He reported that he was trying to get some underwear on and fell.  He did not hit his head or had any loss of consciousness.  EMS was called ? ?Assessment and Plan: ? ?UTI (urinary tract infection) ?-Urine culture showed less than 10,000 colonies of insignificant growth ?-Patient was placed on IV Rocephin, has received 3 doses ?  ?Generalized weakness, fall ?-PT OT consulted, recommended SNF for rehab,  ? ?  ?Acute metabolic encephalopathy ?- Patient was noted to be lethargic, garbled speech and question of focal weakness on 5/3.   ?-Stat CT head showed no significant change from 09/02/2021 CT.  Multiple treated metastatic brain lesions, no acute intracranial process. ?-Fairly alert and oriented, appears  at his baseline.  ?  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  ?Neutropenia secondary to chemotherapy ?-On 5/4, CBC noted WBC count of 0.6 ?- d/w Dr Burr Medico, on-call oncology, patient is undergoing chemotherapy, last received on 4/28, had not received Granix ?-Received 1 dose of filgrastim on 09/06/2021, WBC count improving  ?  ?Diabetes mellitus type 2, NIDDM, uncontrolled with hyperglycemia ?-Hemoglobin A1c 10.6, uncontrolled ?-Continue outpatient regimen and recommend sliding scale insulin while at SNF for correction factor.  Patient will need to follow-up with his PCP for closer monitoring and better glycemic control. ?  ?  ?Hyperlipidemia ?-Continue statin ?  ?Hypertension, tachycardia ?-Improved ?-Continue Lopressor 25 mg twice daily ?  ? ? ? ?  ? ?Pain control - [employer] Controlled Substance Reporting System database was reviewed. and patient was instructed, not to drive, operate heavy machinery, perform activities at heights, swimming or participation in water activities or provide baby-sitting services while on Pain, Sleep and Anxiety Medications; until their outpatient Physician has advised to do so again. Also recommended to not to take more than prescribed Pain, Sleep and Anxiety Medications.  ?Consultants: Oncology, Dr. Burr Medico via phone consultation ?Procedures performed: None ?Disposition: Skilled nursing facility ?Diet recommendation:  ?Discharge Diet Orders (From admission, onward)  ? ?  Start     Ordered  ? 09/10/21 0000  Diet - low sodium heart healthy       ? 09/10/21 1004  ? 09/10/21 0000  Diet Carb  Modified       ? 09/10/21 1004  ? ?  ?  ? ?  ? ?Carb modified diet ?DISCHARGE MEDICATION: ?Allergies as of 09/10/2021   ?No Known Allergies ?  ? ?  ?Medication List  ?  ? ?STOP taking these medications   ? ?amLODipine 5 MG tablet ?Commonly known as: NORVASC ?  ? ?  ? ?TAKE these medications   ? ?albuterol 108 (90  Base) MCG/ACT inhaler ?Commonly known as: VENTOLIN HFA ?Inhale 2 puffs into the lungs every 6 (six) hours as needed for wheezing or shortness of breath. ?  ?blood glucose meter kit and supplies Kit ?Dispense based on patient and insurance preference. Use up to four times daily as directed. ?  ?Blood Pressure Monitor/Arm Devi ?Check BP once a day. DX I 10 ?  ?dexamethasone 4 MG tablet ?Commonly known as: DECADRON ?Take 1 tablet (4 mg total) by mouth every 12 (twelve) hours. ?What changed: when to take this ?  ?diphenhydrAMINE-zinc acetate cream ?Commonly known as: BENADRYL ?Apply topically 2 (two) times daily as needed for itching. ?What changed: how much to take ?  ?empagliflozin 10 MG Tabs tablet ?Commonly known as: Jardiance ?Take 1 tablet (10 mg total) by mouth daily before breakfast. ?  ?folic acid 1 MG tablet ?Commonly known as: FOLVITE ?Take 1 tablet (1 mg total) by mouth daily. ?  ?gabapentin 100 MG capsule ?Commonly known as: NEURONTIN ?Take 1 capsule (100 mg total) by mouth 2 (two) times daily. ?  ?latanoprost 0.005 % ophthalmic solution ?Commonly known as: XALATAN ?Place 1 drop into both eyes at bedtime. ?  ?levETIRAcetam 1000 MG tablet ?Commonly known as: KEPPRA ?Take 1 tablet by mouth twice daily ?What changed: when to take this ?  ?metFORMIN 500 MG tablet ?Commonly known as: GLUCOPHAGE ?TAKE 1 TABLET BY MOUTH TWICE DAILY WITH MEALS ?  ?metoprolol tartrate 25 MG tablet ?Commonly known as: LOPRESSOR ?Take 1 tablet (25 mg total) by mouth 2 (two) times daily. ?  ?multivitamin Tabs tablet ?Take 1 tablet by mouth daily with breakfast. ?  ?pantoprazole 40 MG tablet ?Commonly known as: PROTONIX ?Take 1 tablet (40 mg total) by mouth daily. ?What changed: when to take this ?  ?rosuvastatin 20 MG tablet ?Commonly known as: CRESTOR ?Take 20 mg by mouth daily. ?  ?tirzepatide 5 MG/0.5ML Pen ?Commonly known as: MOUNJARO ?Inject 5 mg into the skin once a week. ?What changed: when to take this ?  ? ?  ? ?  ?  ? ? ?   ?Discharge Care Instructions  ?(From admission, onward)  ?  ? ? ?  ? ?  Start     Ordered  ? 09/10/21 0000  Discharge wound care:       ?Comments: Cleanse perineal skin with soap and water each shift.  Peel back foreskin and cleanse glans of penis twice daily and as needed soilage.  Apply barrier cream after PeriCare.  ? 09/10/21 1004  ? ?  ?  ? ?  ? ? Follow-up Information   ? ? Horald Pollen, MD. Schedule an appointment as soon as possible for a visit in 2 week(s).   ?Specialty: Internal Medicine ?Why: for hospital follow-up ?Contact information: ?[address] ?[place] [address] ?[phone] ? ? ?  ?  ? ?  ?  ? ?  ? ?Discharge Exam: ?S: Alert and oriented, eating breakfast, close to his baseline mental status ? ?Vitals:  ? 09/09/21 1013 09/09/21 1532 09/09/21 2013 09/10/21 0416  ?  BP: 126/80 125/87 (!) 147/94 116/79  ?Pulse:  81 86 90  ?Resp:  $Remov'16 15 15  'jIqeCr$ ?Temp:  98 ?F (36.7 ?C) 97.9 ?F (36.6 ?C) 98.3 ?F (36.8 ?C)  ?TempSrc:  Oral Oral Oral  ?SpO2:  94% 100% 94%  ?  ?Physical Exam ?General: Alert and oriented x self and place, NAD ?Cardiovascular: S1 S2 clear, RRR. No pedal edema b/l ?Respiratory: CTAB, no wheezing, rales or rhonchi ?Gastrointestinal: Soft, nontender, nondistended, NBS ?Ext: no pedal edema bilaterally ?Neuro: no new deficits ? ? ? ?Condition at discharge: fair ? ?The results of significant diagnostics from this hospitalization (including imaging, microbiology, ancillary and laboratory) are listed below for reference.  ? ?Imaging Studies: ?CT HEAD WO CONTRAST (5MM) ? ?Result Date: 09/04/2021 ?CLINICAL DATA:  Acute neuro deficit. Stroke suspected. Weakness. 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 EXAM: CT HEAD WITHOUT CONTRAST TECHNIQUE: Contiguous axial images were obtained from the base of the skull through the vertex without intravenous contrast. RADIATION DOSE REDUCTION: This exam was performed according to the departmental dose-optimization program which includes  automated exposure control, adjustment of the mA and/or kV according to patient size and/or use of iterative reconstruction technique. COMPARISON:  CT brain 09/02/2021 and 07/02/2021; MRI brain 08/28/2021

## 2021-09-10 NOTE — Evaluation (Signed)
SLP Cancellation Note ? ?Patient Details ?Name: Benjamin Willis. ?MRN: [id number] ?DOB: 1946-03-04 ? ? ?Cancelled treatment:       Reason Eval/Treat Not Completed: Other (comment) (pt working with staff to access veins  ?at this time, will continue efforts) ?Kathleen Lime, MS CCC SLP ?Acute Rehab Services ?Office [phone] ?Pager [phone] ? ? ?[name] ?09/10/2021, 9:25 AM ? ? ? ?

## 2021-09-10 NOTE — Consult Note (Signed)
[facility] CM Inpatient Consult ? ? ?09/10/2021 ? ?Kallen Florina Ou. ?January 26, 1946 ?[id number] ? ?THN CM Follow Up: ? ?Per review, patient disposition is for SNF. No THN CM follow up as needs will be met at SNF level of care. ? ?Netta Cedars, MSN, RN ?[facility] Liaison ?Toll free office [phone]  ? ?

## 2021-09-11 ENCOUNTER — Encounter: Payer: Self-pay | Admitting: Hematology and Oncology

## 2021-09-11 DIAGNOSIS — C3490 Malignant neoplasm of unspecified part of unspecified bronchus or lung: Secondary | ICD-10-CM | POA: Diagnosis not present

## 2021-09-11 DIAGNOSIS — K219 Gastro-esophageal reflux disease without esophagitis: Secondary | ICD-10-CM | POA: Diagnosis not present

## 2021-09-11 DIAGNOSIS — D72819 Decreased white blood cell count, unspecified: Secondary | ICD-10-CM | POA: Diagnosis not present

## 2021-09-11 DIAGNOSIS — G936 Cerebral edema: Secondary | ICD-10-CM | POA: Diagnosis not present

## 2021-09-11 DIAGNOSIS — D708 Other neutropenia: Secondary | ICD-10-CM | POA: Diagnosis not present

## 2021-09-11 DIAGNOSIS — R770 Abnormality of albumin: Secondary | ICD-10-CM | POA: Diagnosis not present

## 2021-09-11 DIAGNOSIS — M6281 Muscle weakness (generalized): Secondary | ICD-10-CM | POA: Diagnosis not present

## 2021-09-11 DIAGNOSIS — E785 Hyperlipidemia, unspecified: Secondary | ICD-10-CM | POA: Diagnosis not present

## 2021-09-11 DIAGNOSIS — D6481 Anemia due to antineoplastic chemotherapy: Secondary | ICD-10-CM | POA: Diagnosis not present

## 2021-09-11 LAB — CBC WITH DIFFERENTIAL/PLATELET
Abs Immature Granulocytes: 0 10*3/uL (ref 0.00–0.07)
Basophils Absolute: 0 10*3/uL (ref 0.0–0.1)
Basophils Relative: 0 %
Eosinophils Absolute: 0 10*3/uL (ref 0.0–0.5)
Eosinophils Relative: 0 %
HCT: 33.4 % — ABNORMAL LOW (ref 39.0–52.0)
Hemoglobin: 11.3 g/dL — ABNORMAL LOW (ref 13.0–17.0)
Immature Granulocytes: 0 %
Lymphocytes Relative: 53 %
Lymphs Abs: 0.3 10*3/uL — ABNORMAL LOW (ref 0.7–4.0)
MCH: 33 pg (ref 26.0–34.0)
MCHC: 33.8 g/dL (ref 30.0–36.0)
MCV: 97.7 fL (ref 80.0–100.0)
Monocytes Absolute: 0 10*3/uL — ABNORMAL LOW (ref 0.1–1.0)
Monocytes Relative: 7 %
Neutro Abs: 0.2 10*3/uL — CL (ref 1.7–7.7)
Neutrophils Relative %: 40 %
Platelets: 202 10*3/uL (ref 150–400)
RBC: 3.42 MIL/uL — ABNORMAL LOW (ref 4.22–5.81)
RDW: 14 % (ref 11.5–15.5)
WBC: 0.6 10*3/uL — CL (ref 4.0–10.5)
nRBC: 0 % (ref 0.0–0.2)

## 2021-09-12 DIAGNOSIS — G9341 Metabolic encephalopathy: Secondary | ICD-10-CM | POA: Diagnosis not present

## 2021-09-12 DIAGNOSIS — L89626 Pressure-induced deep tissue damage of left heel: Secondary | ICD-10-CM | POA: Diagnosis not present

## 2021-09-12 DIAGNOSIS — D708 Other neutropenia: Secondary | ICD-10-CM | POA: Diagnosis not present

## 2021-09-12 DIAGNOSIS — G4089 Other seizures: Secondary | ICD-10-CM | POA: Diagnosis not present

## 2021-09-12 DIAGNOSIS — L89153 Pressure ulcer of sacral region, stage 3: Secondary | ICD-10-CM | POA: Diagnosis not present

## 2021-09-12 DIAGNOSIS — C3490 Malignant neoplasm of unspecified part of unspecified bronchus or lung: Secondary | ICD-10-CM | POA: Diagnosis not present

## 2021-09-12 DIAGNOSIS — E1169 Type 2 diabetes mellitus with other specified complication: Secondary | ICD-10-CM | POA: Diagnosis not present

## 2021-09-12 DIAGNOSIS — E46 Unspecified protein-calorie malnutrition: Secondary | ICD-10-CM | POA: Diagnosis not present

## 2021-09-12 DIAGNOSIS — M6281 Muscle weakness (generalized): Secondary | ICD-10-CM | POA: Diagnosis not present

## 2021-09-12 DIAGNOSIS — B029 Zoster without complications: Secondary | ICD-10-CM | POA: Diagnosis not present

## 2021-09-12 DIAGNOSIS — C7931 Secondary malignant neoplasm of brain: Secondary | ICD-10-CM | POA: Diagnosis not present

## 2021-09-12 DIAGNOSIS — H409 Unspecified glaucoma: Secondary | ICD-10-CM | POA: Diagnosis not present

## 2021-09-13 ENCOUNTER — Telehealth: Payer: Self-pay | Admitting: Hematology and Oncology

## 2021-09-13 DIAGNOSIS — D72819 Decreased white blood cell count, unspecified: Secondary | ICD-10-CM | POA: Diagnosis not present

## 2021-09-13 DIAGNOSIS — G4089 Other seizures: Secondary | ICD-10-CM | POA: Diagnosis not present

## 2021-09-13 DIAGNOSIS — T451X5A Adverse effect of antineoplastic and immunosuppressive drugs, initial encounter: Secondary | ICD-10-CM | POA: Diagnosis not present

## 2021-09-13 DIAGNOSIS — D6481 Anemia due to antineoplastic chemotherapy: Secondary | ICD-10-CM | POA: Diagnosis not present

## 2021-09-13 DIAGNOSIS — D708 Other neutropenia: Secondary | ICD-10-CM | POA: Diagnosis not present

## 2021-09-13 DIAGNOSIS — C3490 Malignant neoplasm of unspecified part of unspecified bronchus or lung: Secondary | ICD-10-CM | POA: Diagnosis not present

## 2021-09-13 DIAGNOSIS — E785 Hyperlipidemia, unspecified: Secondary | ICD-10-CM | POA: Diagnosis not present

## 2021-09-13 DIAGNOSIS — R770 Abnormality of albumin: Secondary | ICD-10-CM | POA: Diagnosis not present

## 2021-09-13 DIAGNOSIS — G936 Cerebral edema: Secondary | ICD-10-CM | POA: Diagnosis not present

## 2021-09-13 NOTE — Telephone Encounter (Signed)
.  Called patient to schedule appointment per 5/11 inbasket, patient sister informed me that pt is in facility and unavailable to come to appts. Dr and desk nurse notified.  ?

## 2021-09-16 ENCOUNTER — Ambulatory Visit: Payer: Medicare HMO | Admitting: Podiatry

## 2021-09-16 ENCOUNTER — Other Ambulatory Visit: Payer: Medicare HMO

## 2021-09-16 DIAGNOSIS — E860 Dehydration: Secondary | ICD-10-CM | POA: Diagnosis not present

## 2021-09-16 DIAGNOSIS — J9601 Acute respiratory failure with hypoxia: Secondary | ICD-10-CM | POA: Diagnosis not present

## 2021-09-16 DIAGNOSIS — R112 Nausea with vomiting, unspecified: Secondary | ICD-10-CM | POA: Diagnosis not present

## 2021-09-16 DIAGNOSIS — M6281 Muscle weakness (generalized): Secondary | ICD-10-CM | POA: Diagnosis not present

## 2021-09-16 DIAGNOSIS — R627 Adult failure to thrive: Secondary | ICD-10-CM | POA: Diagnosis not present

## 2021-09-17 ENCOUNTER — Ambulatory Visit: Payer: Medicare HMO | Admitting: Emergency Medicine

## 2021-09-17 ENCOUNTER — Non-Acute Institutional Stay: Payer: Medicare HMO | Admitting: Hospice

## 2021-09-17 DIAGNOSIS — J9601 Acute respiratory failure with hypoxia: Secondary | ICD-10-CM | POA: Diagnosis not present

## 2021-09-17 DIAGNOSIS — Z515 Encounter for palliative care: Secondary | ICD-10-CM

## 2021-09-17 DIAGNOSIS — E43 Unspecified severe protein-calorie malnutrition: Secondary | ICD-10-CM

## 2021-09-17 DIAGNOSIS — C3490 Malignant neoplasm of unspecified part of unspecified bronchus or lung: Secondary | ICD-10-CM | POA: Diagnosis not present

## 2021-09-17 DIAGNOSIS — E860 Dehydration: Secondary | ICD-10-CM | POA: Diagnosis not present

## 2021-09-17 DIAGNOSIS — J189 Pneumonia, unspecified organism: Secondary | ICD-10-CM

## 2021-09-17 DIAGNOSIS — R531 Weakness: Secondary | ICD-10-CM

## 2021-09-17 DIAGNOSIS — C7931 Secondary malignant neoplasm of brain: Secondary | ICD-10-CM | POA: Diagnosis not present

## 2021-09-17 DIAGNOSIS — R627 Adult failure to thrive: Secondary | ICD-10-CM | POA: Diagnosis not present

## 2021-09-17 DIAGNOSIS — R066 Hiccough: Secondary | ICD-10-CM | POA: Diagnosis not present

## 2021-09-17 NOTE — Progress Notes (Signed)
? ? ?Manufacturing engineer ?Community Palliative Care Consult Note ?Telephone: [phone]  ?Fax: [phone] ? ?PATIENT NAME: Benjamin Benjamin Willis. ?[address] ?[place] [address] ?[phone] (home)  ?DOB: 1945-07-09 ?MRN: [id number] ? ?PRIMARY CARE PROVIDER:    ?Horald Pollen, MD,  ?[address] ?[place] [address] ?[phone] ? ?REFERRING PROVIDER:   ?Horald Pollen, MD ?[address] ?[place],  [place] [address] ?[phone] ? ?RESPONSIBLE PARTY:   Benjamin Willis ?Contact Information   ? ? Name Relation Home Work Mobile  ? Jonn Shingles Sister [phone]  [phone]  ? ?  ? ? ?I met face to face with patient at facility. Visit to build trust and highlight Palliative Medicine as specialized medical care for people living with serious illness, aimed at facilitating better quality of Benjamin Willis through symptoms relief, assisting with advance care planning and complex medical decision making. NP called Benjamin Willis and updated her on visit. She endorsed palliative service for patient; also expressed appreciation for the call.  ?ASSESSMENT AND / RECOMMENDATIONS:  ? ?Advance Care Planning: Our advance care planning conversation included a discussion about:    ?The value and importance of advance care planning  ?Difference between Hospice and Palliative care ?Exploration of goals of care in the event of a sudden injury or illness  ?Identification and preparation of a healthcare agent  ?Review and updating or creation of an  advance directive document . ?Decision not to resuscitate or to de-escalate disease focused treatments due to poor prognosis. ? ?CODE STATUS: Patient is a full code ? ?Goals of Care: Goals include to maximize quality of Benjamin Willis and symptom management ? ?I spent 16  minutes providing this initial consultation. More than 50% of the time in this consultation was spent on counseling patient and coordinating  communication. ?-------------------------------------------------------------------------------------------------------------------------------------- ? ?Symptom Management/Plan: ?Lung cancer: Metastasis to brain. Had 3 rounds of chemo 3 weeks ago. Benjamin Willis said she will discuss with patient to determine whether to continue aggressive treatment.  ?Pneumonia: PCP initiated ceftriaxone, and Levaquin.  Guaifenesin for cough.  Take as ordered and to completion.  ?Protein caloric malnutrition: Abnormal weight loss in the past 2 months, current weight is 103.3 pounds down from 160 pounds 07/29/21.  Current albumin 3.1 09/03/21.  Continue to offer assistance during meals to ensure adequate oral intake.  Initiate Medpass 3 times daily, and Ensure daily.  ST consult as needed.  Routine CBC CMP. ?Weakness: PT/OT in progress. ?Follow up: Palliative care will continue to follow for complex medical decision making, advance care planning, and clarification of goals. Return 6 weeks or prn. Encouraged to call provider sooner with any concerns.  ? ?Family /Caregiver/Community Supports: Patient in SNF for ongoing care ? ?HOSPICE ELIGIBILITY/DIAGNOSIS: TBD ? ?Chief Complaint: Initial Palliative care visit ? ?HISTORY OF PRESENT ILLNESS:  Benjamin Benjamin Willis. is a 76 y.o. year old male  with multiple morbidities requiring close monitoring and with high risk of complications and  mortality: Lung cancer with metastasis to brain, type 2 diabetes mellitus, generalized weakness, neutropenia secondary to chemotherapy, abnormal weight loss.  Patient started on Rocephin, and Levaquin for pneumonia today by PCP. History obtained from review of EMR, discussion with primary team, caregiver, family and/or Mr. Gallardo.  ?Review and summarization of Epic records shows history from other than patient. Rest of 10 point ROS asked and negative. Independent interpretation of tests and reviewed as needed, available labs, patient records, imaging, studies and  related documents from the EMR. ? ?Physical Exam: ?Height/Weight: 5 feet 6 inches / 103.3 pounds ?  Constitutional: NAD ?General: Frail, cooperative ?EYES: anicteric sclera, lids intact, no discharge  ?ENMT: Moist mucous membrane ?CV: S1 S2, RRR, no LE edema ?Pulmonary: Wet cough, diminished lung sounds ?Abdomen: active BS + 4 quadrants, soft and non tender ?GU: no suprapubic tenderness ?MSK: weakness, sarcopenia, limited ROM ?Skin: warm and dry, no rashes or wounds on visible skin ?Neuro:  weakness, otherwise non focal ?Psych: coping ?Hem/lymph/immuno: no widespread bruising ? ? ?PAST MEDICAL HISTORY:  ?Active Ambulatory Problems  ?  Diagnosis Date Noted  ? Goals of care, counseling/discussion 01/01/2017  ? Dyslipidemia associated with type 2 diabetes mellitus ([place]) 06/09/2017  ? Hypertension associated with type 2 diabetes mellitus ([place]) 05/24/2018  ? Dyslipidemia 05/24/2018  ? Lung nodule 02/02/2019  ? Primary cancer of right upper lobe of lung ([place]) 02/07/2019  ? Atherosclerosis of aorta ([place]) 05/31/2020  ? Metastatic lung cancer (metastasis from lung to other site) [facility]) 10/16/2020  ? Diabetes mellitus type 2 in nonobese ([place]) 10/16/2020  ? Glaucoma 10/16/2020  ? Brain metastases   ? Palliative care by specialist   ? Port-A-Cath in place 12/14/2020  ? Non-small cell lung cancer metastatic to brain [facility]) 04/01/2021  ? Leukopenia with mild neutropenia 07/02/2021  ? Anemia associated with chemotherapy 07/02/2021  ? Fall at home, initial encounter 07/03/2021  ? Left leg weakness 07/03/2021  ? Cognitive impairment 07/04/2021  ? Shingles rash 07/07/2021  ? Uncontrolled type 2 diabetes mellitus with hyperglycemia ([place]) 08/20/2021  ? Weakness 08/20/2021  ? At risk for falls 08/20/2021  ? UTI (urinary tract infection) 09/02/2021  ? ?Resolved Ambulatory Problems  ?  Diagnosis Date Noted  ? Acute conjunctivitis of both eyes 01/01/2017  ? Elevated blood pressure reading in office without diagnosis of hypertension 01/01/2017   ? Lung cancer metastatic to brain [facility]) 10/16/2020  ? SIRS (systemic inflammatory response syndrome) ([place]) 01/07/2021  ? E. coli urinary tract infection 01/07/2021  ? Metastasis to brain [facility]) 07/02/2021  ? ?Past Medical History:  ?Diagnosis Date  ? Allergy   ? Diabetes mellitus without complication ([place])   ? History of radiation therapy   ? History of radiation therapy 10/30/2020  ? Hyperlipidemia   ? Hypertension   ? Lung cancer ([place])   ? ? ?SOCIAL HX:  ?Social History  ? ?Tobacco Use  ? Smoking status: Former  ?  Packs/day: 1.00  ?  Years: 48.00  ?  Pack years: 48.00  ?  Types: Cigarettes  ?  Quit date: 02/20/2015  ?  Years since quitting: 6.5  ? Smokeless tobacco: Former  ?  Quit date: 10/2013  ?Substance Use Topics  ? Alcohol use: No  ? ?  ?FAMILY HX:  ?Family History  ?Problem Relation Age of Onset  ? Lung cancer Neg Hx   ?   ? ?ALLERGIES: No Known Allergies   ? ?PERTINENT MEDICATIONS:  ?Outpatient Encounter Medications as of 09/17/2021  ?Medication Sig  ? albuterol (VENTOLIN HFA) 108 (90 Base) MCG/ACT inhaler Inhale 2 puffs into the lungs every 6 (six) hours as needed for wheezing or shortness of breath.  ? blood glucose meter kit and supplies KIT Dispense based on patient and insurance preference. Use up to four times daily as directed.  ? Blood Pressure Monitoring (BLOOD PRESSURE MONITOR/ARM) DEVI Check BP once a day. DX I 10  ? dexamethasone (DECADRON) 4 MG tablet Take 1 tablet (4 mg total) by mouth every 12 (twelve) hours. (Patient taking differently: Take 4 mg by mouth in the morning and at bedtime.)  ?  diphenhydrAMINE-zinc acetate (BENADRYL) cream Apply topically 2 (two) times daily as needed for itching. (Patient taking differently: Apply 1 application. topically 2 (two) times daily as needed for itching.)  ? empagliflozin (JARDIANCE) 10 MG TABS tablet Take 1 tablet (10 mg total) by mouth daily before breakfast.  ? folic acid (FOLVITE) 1 MG tablet Take 1 tablet (1 mg total) by mouth daily.  ?  gabapentin (NEURONTIN) 100 MG capsule Take 1 capsule (100 mg total) by mouth 2 (two) times daily.  ? latanoprost (XALATAN) 0.005 % ophthalmic solution Place 1 drop into both eyes at bedtime.  ? levETIRAcetam (KEPPRA) 1000 MG tablet Take 1 tablet by mouth twice daily (Patient taking differently: Take

## 2021-09-18 ENCOUNTER — Encounter (HOSPITAL_COMMUNITY): Payer: Self-pay

## 2021-09-18 ENCOUNTER — Other Ambulatory Visit: Payer: Self-pay

## 2021-09-18 ENCOUNTER — Emergency Department (HOSPITAL_COMMUNITY): Payer: Medicare HMO

## 2021-09-18 ENCOUNTER — Inpatient Hospital Stay (HOSPITAL_COMMUNITY)
Admission: EM | Admit: 2021-09-18 | Discharge: 2021-09-25 | DRG: 189 | Disposition: A | Discharge status: Hospice | Payer: Medicare HMO | Source: Skilled Nursing Facility | Attending: Family Medicine | Admitting: Family Medicine

## 2021-09-18 DIAGNOSIS — I10 Essential (primary) hypertension: Secondary | ICD-10-CM | POA: Diagnosis present

## 2021-09-18 DIAGNOSIS — R6520 Severe sepsis without septic shock: Secondary | ICD-10-CM | POA: Diagnosis not present

## 2021-09-18 DIAGNOSIS — Z66 Do not resuscitate: Secondary | ICD-10-CM | POA: Diagnosis not present

## 2021-09-18 DIAGNOSIS — Z1152 Encounter for screening for COVID-19: Secondary | ICD-10-CM | POA: Diagnosis not present

## 2021-09-18 DIAGNOSIS — Z87891 Personal history of nicotine dependence: Secondary | ICD-10-CM | POA: Diagnosis not present

## 2021-09-18 DIAGNOSIS — I2693 Single subsegmental pulmonary embolism without acute cor pulmonale: Secondary | ICD-10-CM | POA: Diagnosis not present

## 2021-09-18 DIAGNOSIS — Z7189 Other specified counseling: Secondary | ICD-10-CM

## 2021-09-18 DIAGNOSIS — E860 Dehydration: Secondary | ICD-10-CM | POA: Diagnosis not present

## 2021-09-18 DIAGNOSIS — R0600 Dyspnea, unspecified: Secondary | ICD-10-CM | POA: Diagnosis not present

## 2021-09-18 DIAGNOSIS — I2699 Other pulmonary embolism without acute cor pulmonale: Secondary | ICD-10-CM

## 2021-09-18 DIAGNOSIS — Z79899 Other long term (current) drug therapy: Secondary | ICD-10-CM | POA: Diagnosis not present

## 2021-09-18 DIAGNOSIS — R7401 Elevation of levels of liver transaminase levels: Secondary | ICD-10-CM | POA: Diagnosis present

## 2021-09-18 DIAGNOSIS — R4182 Altered mental status, unspecified: Secondary | ICD-10-CM | POA: Diagnosis not present

## 2021-09-18 DIAGNOSIS — Z515 Encounter for palliative care: Secondary | ICD-10-CM | POA: Diagnosis not present

## 2021-09-18 DIAGNOSIS — Z923 Personal history of irradiation: Secondary | ICD-10-CM | POA: Diagnosis not present

## 2021-09-18 DIAGNOSIS — R7989 Other specified abnormal findings of blood chemistry: Secondary | ICD-10-CM | POA: Diagnosis not present

## 2021-09-18 DIAGNOSIS — L89322 Pressure ulcer of left buttock, stage 2: Secondary | ICD-10-CM

## 2021-09-18 DIAGNOSIS — R000 Tachycardia, unspecified: Secondary | ICD-10-CM | POA: Diagnosis not present

## 2021-09-18 DIAGNOSIS — L89616 Pressure-induced deep tissue damage of right heel: Secondary | ICD-10-CM | POA: Diagnosis not present

## 2021-09-18 DIAGNOSIS — A419 Sepsis, unspecified organism: Principal | ICD-10-CM

## 2021-09-18 DIAGNOSIS — R54 Age-related physical debility: Secondary | ICD-10-CM | POA: Diagnosis present

## 2021-09-18 DIAGNOSIS — L89153 Pressure ulcer of sacral region, stage 3: Secondary | ICD-10-CM | POA: Diagnosis not present

## 2021-09-18 DIAGNOSIS — Z87440 Personal history of urinary (tract) infections: Secondary | ICD-10-CM

## 2021-09-18 DIAGNOSIS — I82451 Acute embolism and thrombosis of right peroneal vein: Secondary | ICD-10-CM | POA: Diagnosis present

## 2021-09-18 DIAGNOSIS — E870 Hyperosmolality and hypernatremia: Secondary | ICD-10-CM | POA: Diagnosis present

## 2021-09-18 DIAGNOSIS — E876 Hypokalemia: Secondary | ICD-10-CM | POA: Diagnosis present

## 2021-09-18 DIAGNOSIS — C3490 Malignant neoplasm of unspecified part of unspecified bronchus or lung: Secondary | ICD-10-CM | POA: Diagnosis not present

## 2021-09-18 DIAGNOSIS — E1165 Type 2 diabetes mellitus with hyperglycemia: Secondary | ICD-10-CM | POA: Diagnosis present

## 2021-09-18 DIAGNOSIS — C7931 Secondary malignant neoplasm of brain: Secondary | ICD-10-CM | POA: Diagnosis present

## 2021-09-18 DIAGNOSIS — R0902 Hypoxemia: Secondary | ICD-10-CM | POA: Diagnosis not present

## 2021-09-18 DIAGNOSIS — E785 Hyperlipidemia, unspecified: Secondary | ICD-10-CM | POA: Diagnosis present

## 2021-09-18 DIAGNOSIS — I248 Other forms of acute ischemic heart disease: Secondary | ICD-10-CM | POA: Diagnosis present

## 2021-09-18 DIAGNOSIS — I2602 Saddle embolus of pulmonary artery with acute cor pulmonale: Secondary | ICD-10-CM | POA: Diagnosis not present

## 2021-09-18 DIAGNOSIS — R0689 Other abnormalities of breathing: Secondary | ICD-10-CM | POA: Diagnosis not present

## 2021-09-18 DIAGNOSIS — J189 Pneumonia, unspecified organism: Secondary | ICD-10-CM | POA: Diagnosis not present

## 2021-09-18 DIAGNOSIS — H409 Unspecified glaucoma: Secondary | ICD-10-CM | POA: Diagnosis present

## 2021-09-18 DIAGNOSIS — J9601 Acute respiratory failure with hypoxia: Principal | ICD-10-CM | POA: Diagnosis present

## 2021-09-18 DIAGNOSIS — L89312 Pressure ulcer of right buttock, stage 2: Secondary | ICD-10-CM | POA: Diagnosis present

## 2021-09-18 DIAGNOSIS — Z7952 Long term (current) use of systemic steroids: Secondary | ICD-10-CM

## 2021-09-18 DIAGNOSIS — R739 Hyperglycemia, unspecified: Secondary | ICD-10-CM | POA: Diagnosis not present

## 2021-09-18 DIAGNOSIS — J80 Acute respiratory distress syndrome: Secondary | ICD-10-CM | POA: Diagnosis not present

## 2021-09-18 DIAGNOSIS — R079 Chest pain, unspecified: Secondary | ICD-10-CM | POA: Diagnosis not present

## 2021-09-18 DIAGNOSIS — M6281 Muscle weakness (generalized): Secondary | ICD-10-CM | POA: Diagnosis not present

## 2021-09-18 DIAGNOSIS — R627 Adult failure to thrive: Secondary | ICD-10-CM | POA: Diagnosis not present

## 2021-09-18 DIAGNOSIS — K219 Gastro-esophageal reflux disease without esophagitis: Secondary | ICD-10-CM | POA: Diagnosis not present

## 2021-09-18 DIAGNOSIS — R066 Hiccough: Secondary | ICD-10-CM | POA: Diagnosis not present

## 2021-09-18 DIAGNOSIS — C3411 Malignant neoplasm of upper lobe, right bronchus or lung: Secondary | ICD-10-CM | POA: Diagnosis present

## 2021-09-18 DIAGNOSIS — L8990 Pressure ulcer of unspecified site, unspecified stage: Secondary | ICD-10-CM | POA: Insufficient documentation

## 2021-09-18 DIAGNOSIS — Z7984 Long term (current) use of oral hypoglycemic drugs: Secondary | ICD-10-CM

## 2021-09-18 LAB — CBC WITH DIFFERENTIAL/PLATELET
Abs Immature Granulocytes: 0 10*3/uL (ref 0.00–0.07)
Band Neutrophils: 1 %
Basophils Absolute: 0 10*3/uL (ref 0.0–0.1)
Basophils Relative: 0 %
Eosinophils Absolute: 0 10*3/uL (ref 0.0–0.5)
Eosinophils Relative: 0 %
HCT: 38.1 % — ABNORMAL LOW (ref 39.0–52.0)
Hemoglobin: 12.3 g/dL — ABNORMAL LOW (ref 13.0–17.0)
Lymphocytes Relative: 2 %
Lymphs Abs: 0.2 10*3/uL — ABNORMAL LOW (ref 0.7–4.0)
MCH: 32.4 pg (ref 26.0–34.0)
MCHC: 32.3 g/dL (ref 30.0–36.0)
MCV: 100.3 fL — ABNORMAL HIGH (ref 80.0–100.0)
Monocytes Absolute: 0.2 10*3/uL (ref 0.1–1.0)
Monocytes Relative: 3 %
Neutro Abs: 7.4 10*3/uL (ref 1.7–7.7)
Neutrophils Relative %: 94 %
Platelets: 163 10*3/uL (ref 150–400)
RBC: 3.8 MIL/uL — ABNORMAL LOW (ref 4.22–5.81)
RDW: 15 % (ref 11.5–15.5)
WBC: 7.8 10*3/uL (ref 4.0–10.5)
nRBC: 7 /100 WBC — ABNORMAL HIGH
nRBC: 8.3 % — ABNORMAL HIGH (ref 0.0–0.2)

## 2021-09-18 LAB — COMPREHENSIVE METABOLIC PANEL
ALT: 46 U/L — ABNORMAL HIGH (ref 0–44)
AST: 80 U/L — ABNORMAL HIGH (ref 15–41)
Albumin: 2.4 g/dL — ABNORMAL LOW (ref 3.5–5.0)
Alkaline Phosphatase: 138 U/L — ABNORMAL HIGH (ref 38–126)
Anion gap: 10 (ref 5–15)
BUN: 15 mg/dL (ref 8–23)
CO2: 24 mmol/L (ref 22–32)
Calcium: 8.8 mg/dL — ABNORMAL LOW (ref 8.9–10.3)
Chloride: 105 mmol/L (ref 98–111)
Creatinine, Ser: 0.81 mg/dL (ref 0.61–1.24)
GFR, Estimated: >60 mL/min (ref >60)
Glucose, Bld: 210 mg/dL — ABNORMAL HIGH (ref 70–99)
Potassium: 3.8 mmol/L (ref 3.5–5.1)
Sodium: 139 mmol/L (ref 135–145)
Total Bilirubin: 0.8 mg/dL (ref 0.3–1.2)
Total Protein: 6.7 g/dL (ref 6.5–8.1)

## 2021-09-18 LAB — BLOOD GAS, VENOUS
Acid-base deficit: 0.4 mmol/L (ref 0.0–2.0)
Bicarbonate: 24.8 mmol/L (ref 20.0–28.0)
O2 Saturation: 54.8 %
Patient temperature: 37
pCO2, Ven: 42 mmHg — ABNORMAL LOW (ref 44–60)
pH, Ven: 7.38 (ref 7.25–7.43)
pO2, Ven: <32 mmHg — CL (ref 32–45)

## 2021-09-18 LAB — LACTIC ACID, PLASMA: Lactic Acid, Venous: 2 mmol/L (ref 0.5–1.9)

## 2021-09-18 LAB — PROTIME-INR
INR: 1.1 (ref 0.8–1.2)
Prothrombin Time: 14.2 seconds (ref 11.4–15.2)

## 2021-09-18 LAB — RESP PANEL BY RT-PCR (FLU A&B, COVID) ARPGX2
Influenza A by PCR: NEGATIVE
Influenza B by PCR: NEGATIVE
SARS Coronavirus 2 by RT PCR: NEGATIVE

## 2021-09-18 LAB — TROPONIN I (HIGH SENSITIVITY): Troponin I (High Sensitivity): 27 ng/L — ABNORMAL HIGH (ref <18)

## 2021-09-18 LAB — BRAIN NATRIURETIC PEPTIDE: B Natriuretic Peptide: 175.6 pg/mL — ABNORMAL HIGH (ref 0.0–100.0)

## 2021-09-18 LAB — APTT: aPTT: 25 seconds (ref 24–36)

## 2021-09-18 LAB — CBG MONITORING, ED: Glucose-Capillary: 216 mg/dL — ABNORMAL HIGH (ref 70–99)

## 2021-09-18 IMAGING — CT CT ANGIO CHEST
2 of 7 series · 17 of 46 positions shown · IV contrast (APPLIED)
Comparison: [DATE], [DATE]

CLINICAL DATA: Short of breath, altered level of consciousness

EXAM:
CT ANGIOGRAPHY CHEST WITH CONTRAST
TECHNIQUE: Multidetector CT imaging of the chest was performed using the
standard protocol during bolus administration of intravenous
contrast. Multiplanar CT image reconstructions and MIPs were
obtained to evaluate the vascular anatomy.

[Series 5: thins · axial · 0.71mm/px · z∈[+9,+274]mm · 15 of 303 slices shown]
[im 19/303  lung]
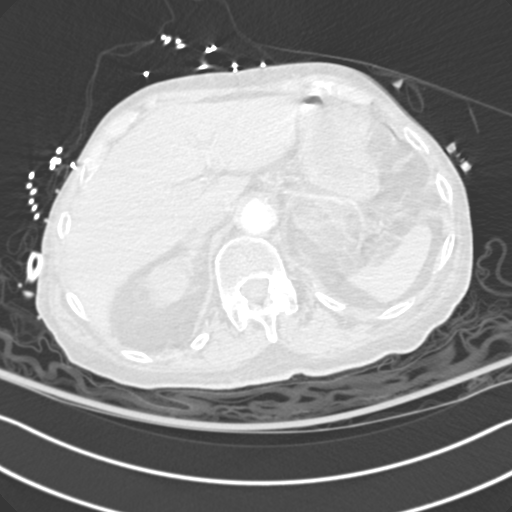
[im 38/303  soft-tissue]
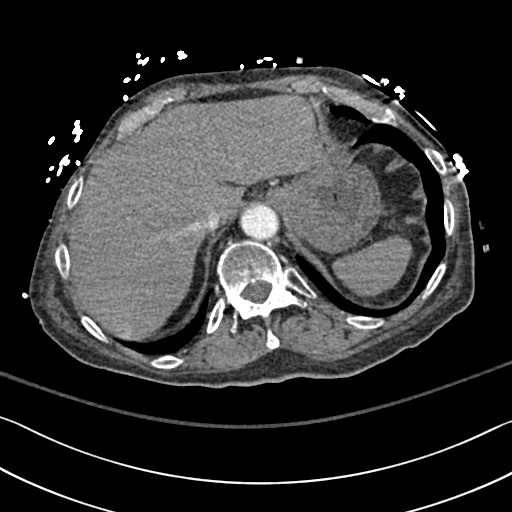
[im 57/303  lung]
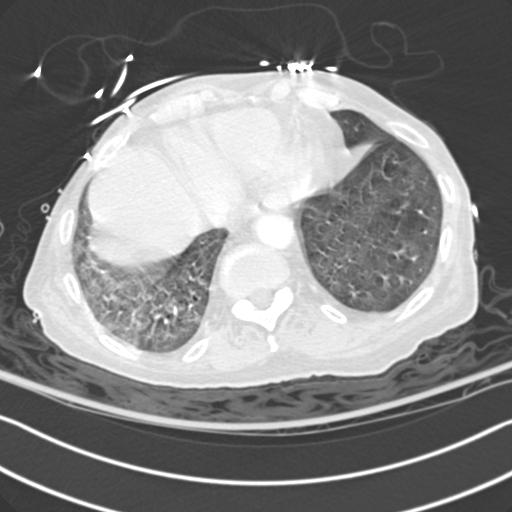
[im 76/303  soft-tissue]
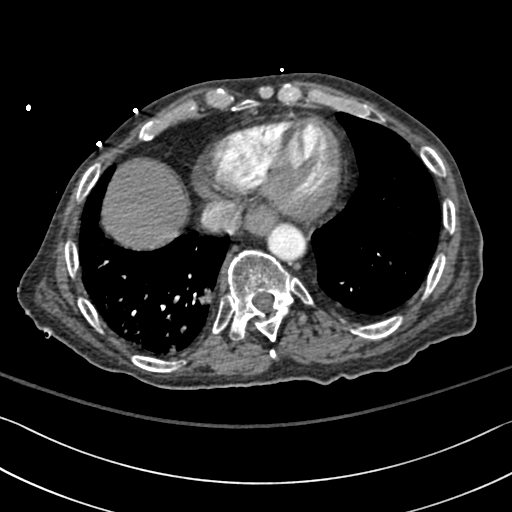
[im 95/303  lung]
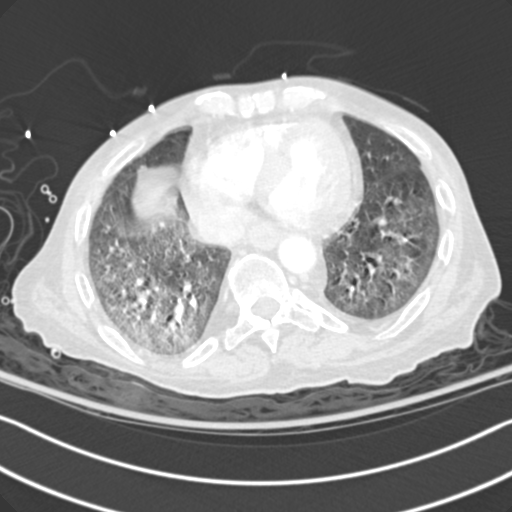
[im 114/303  soft-tissue]
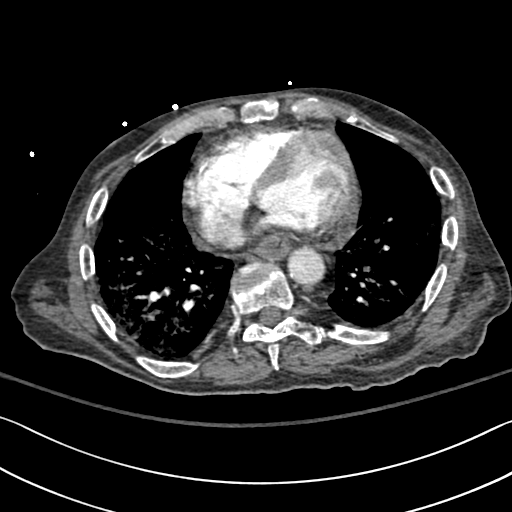
[im 133/303  lung]
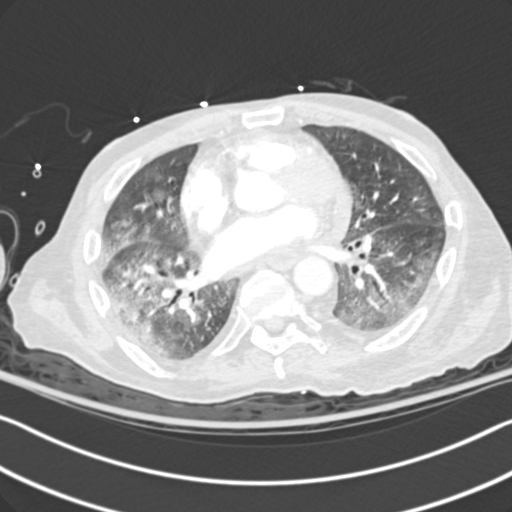
[im 152/303  soft-tissue]
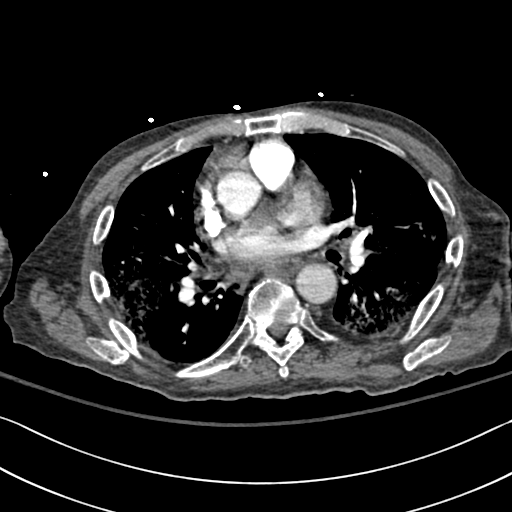
[im 170/303  lung]
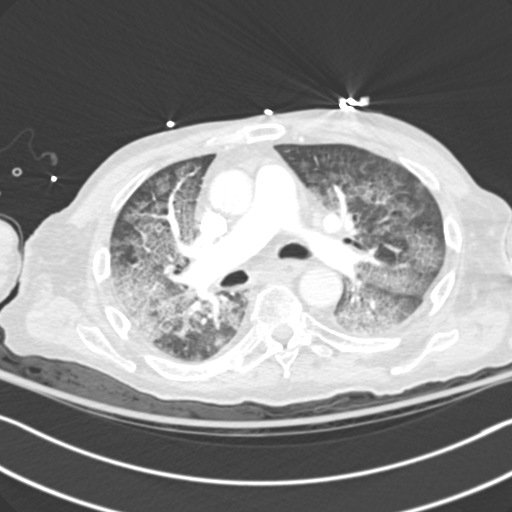
[im 189/303  soft-tissue]
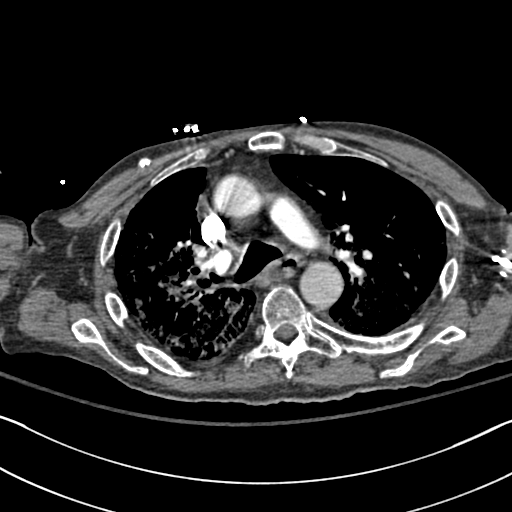
[im 208/303  lung]
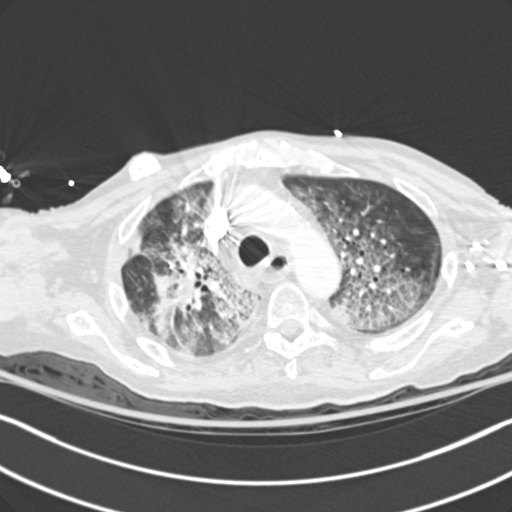
[im 227/303  soft-tissue]
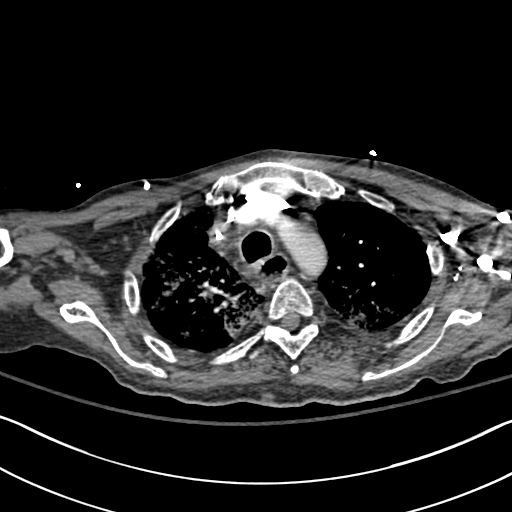
[im 246/303  lung]
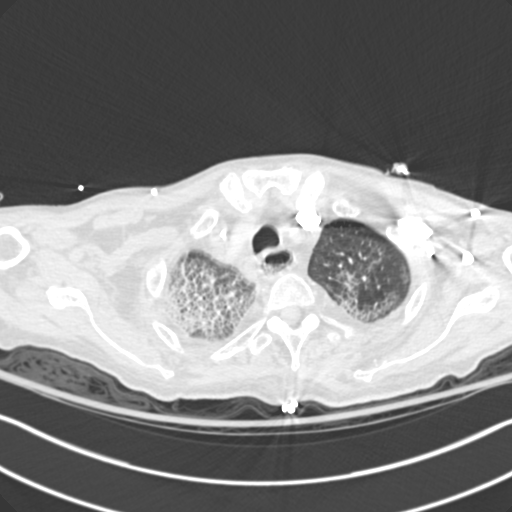
[im 265/303  soft-tissue]
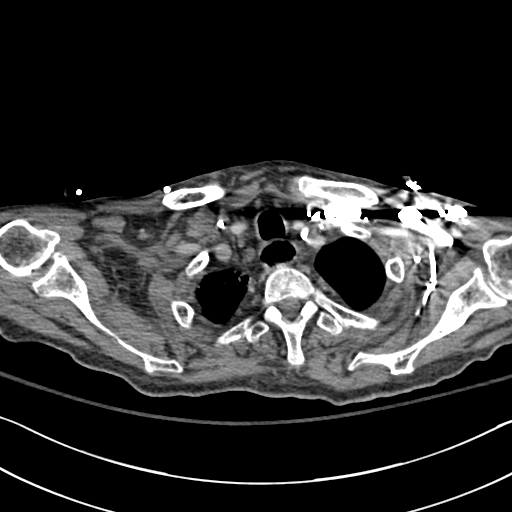
[im 284/303  lung]
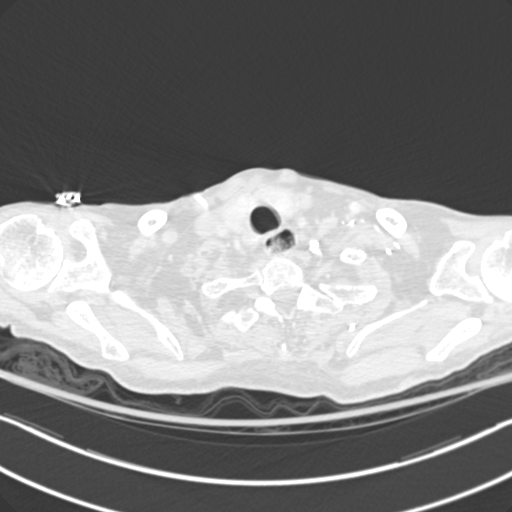

[Series 6: coronal mpr · coronal · 0.65mm/px · 2 of 77 slices shown]
[im 26/77  soft-tissue]
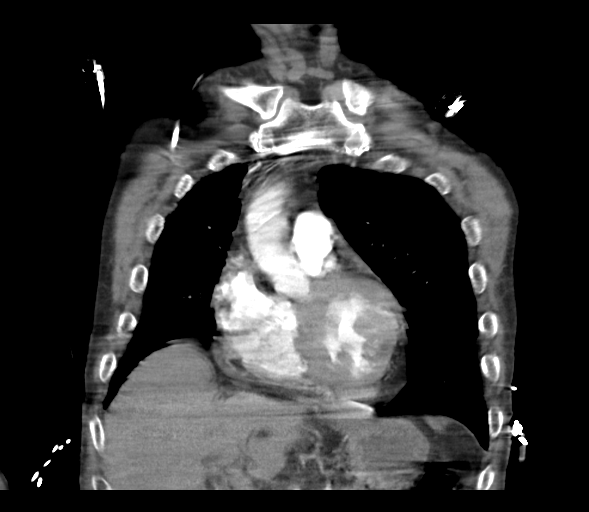
[im 51/77  soft-tissue]
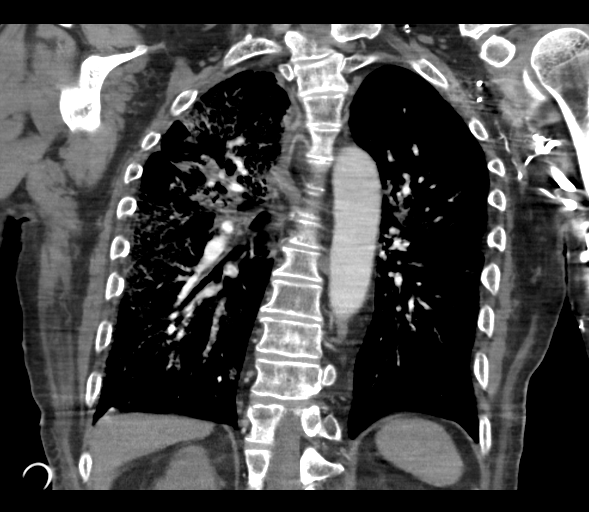

[17 of 46 positions shown; findings below may reference images not displayed]

RADIATION DOSE REDUCTION: This exam was performed according to the
departmental dose-optimization program which includes automated
exposure control, adjustment of the mA and/or kV according to
patient size and/or use of iterative reconstruction technique.

CONTRAST:  80mL OMNIPAQUE IOHEXOL 350 MG/ML SOLN
FINDINGS: Cardiovascular: This is a technically adequate evaluation of the
pulmonary vasculature. Nonocclusive pulmonary emboli are seen within
the left upper lobe segmental branches. Minimal clot burden. No
other filling defects.

The heart is unremarkable without pericardial effusion. No evidence
of thoracic aortic aneurysm or dissection. Stable atherosclerosis.

Mediastinum/Nodes: Borderline enlarged mediastinal adenopathy
measuring up to 9 mm in the right paratracheal region, likely
reactive. There is circumferential wall thickening of the distal
thoracic esophagus, which could reflect esophagitis. Thyroid and
trachea are unremarkable.

Lungs/Pleura: Interval development of bilateral interstitial and
ground-glass opacities, favor multifocal pneumonia over edema. No
effusion or pneumothorax. Chronic post therapeutic changes within
the right upper lobe related to prior radiation therapy for lung
cancer. Central airways are patent.

Upper Abdomen: No acute abnormality. Stable nonspecific 11 mm right
adrenal nodule, previously characterized as adenoma.

Musculoskeletal: No acute or destructive bony lesions. Reconstructed
images demonstrate no additional findings.

Review of the MIP images confirms the above findings.
IMPRESSION: 1. Small segmental left upper lobe pulmonary emboli. Minimal clot
burden. No right heart strain.
2. Widespread bilateral interstitial and ground-glass airspace
opacities, favor multifocal pneumonia over asymmetric edema.
3. Interval development of borderline enlarged mediastinal
adenopathy, likely reactive.
4. Mild circumferential wall thickening of the distal thoracic
esophagus, new since prior study. Favor mild esophagitis.
5.  Aortic Atherosclerosis ([B2]-[B2]).

Critical Value/emergent results were called by telephone at the time
of interpretation on [DATE] at [DATE] to provider KEINOSUKE ,
who verbally acknowledged these results.

## 2021-09-18 IMAGING — DX DG CHEST 1V PORT
1 series · 1 of 1 positions shown · non-contrast
Comparison: [DATE]

CLINICAL DATA: Altered mental status

EXAM:
PORTABLE CHEST 1 VIEW

[chest ap]
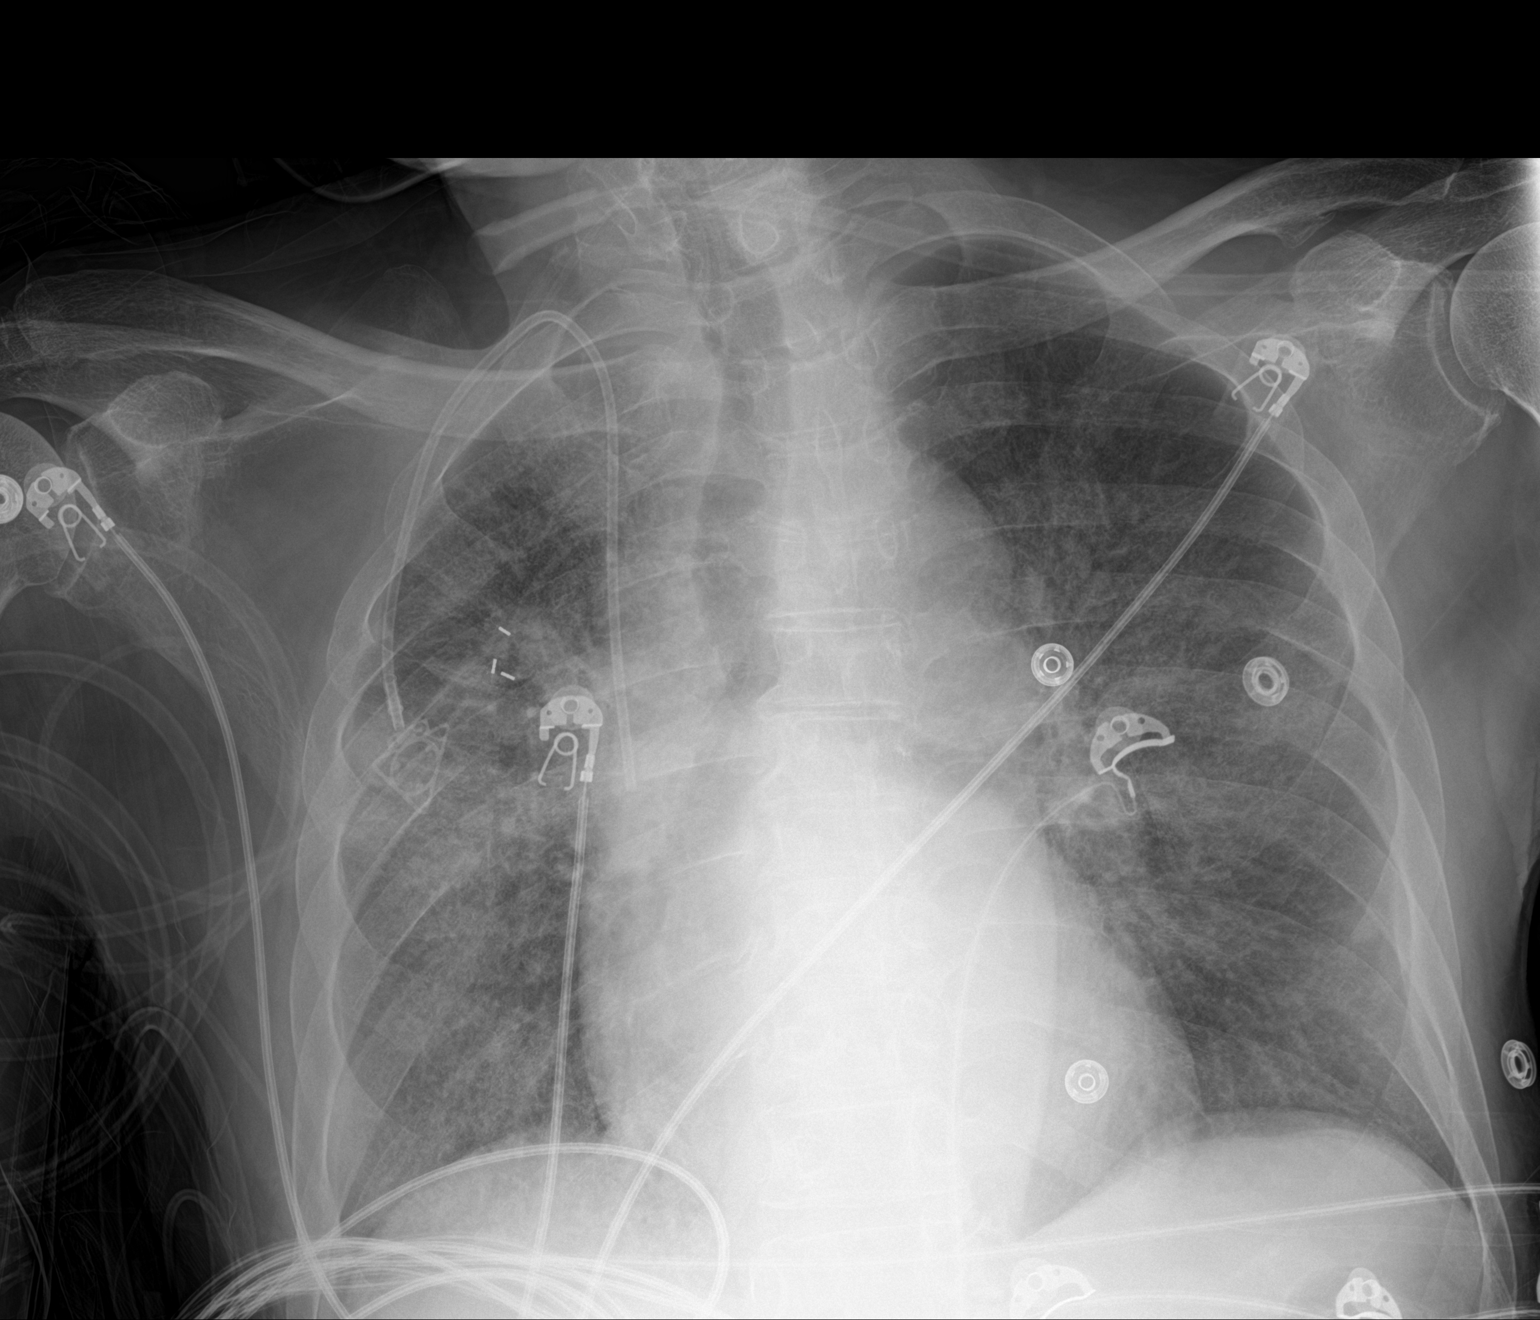

[1 of 1 positions shown; findings below may reference images not displayed]

FINDINGS: Right Port-A-Cath remains in place, unchanged. Heart is normal size.
Bilateral airspace disease, right greater than left. No effusions or
acute bony abnormality.
IMPRESSION: Bilateral airspace disease, asymmetric to the right. Favor infection
although asymmetric edema could have this appearance.

## 2021-09-18 MED ORDER — CHLORHEXIDINE GLUCONATE 0.12 % MT SOLN
15.0000 mL | Freq: Two times a day (BID) | OROMUCOSAL | Status: DC
  Administered 2021-09-19 – 2021-09-24 (×8): 15 mL via OROMUCOSAL
  Filled 2021-09-18 (×8): qty 15

## 2021-09-18 MED ORDER — INSULIN ASPART 100 UNIT/ML IJ SOLN
0.0000 [IU] | Freq: Three times a day (TID) | INTRAMUSCULAR | Status: DC
  Administered 2021-09-19 – 2021-09-20 (×4): 1 [IU] via SUBCUTANEOUS
  Administered 2021-09-20 – 2021-09-21 (×2): 2 [IU] via SUBCUTANEOUS
  Administered 2021-09-21 (×2): 1 [IU] via SUBCUTANEOUS
  Administered 2021-09-22: 2 [IU] via SUBCUTANEOUS
  Administered 2021-09-22 – 2021-09-23 (×2): 3 [IU] via SUBCUTANEOUS
  Administered 2021-09-23: 5 [IU] via SUBCUTANEOUS
  Administered 2021-09-23: 3 [IU] via SUBCUTANEOUS
  Administered 2021-09-24: 5 [IU] via SUBCUTANEOUS

## 2021-09-18 MED ORDER — CEFEPIME HCL 2 G IV SOLR
2.0000 g | Freq: Three times a day (TID) | INTRAVENOUS | Status: DC
Start: 2021-09-19 — End: 2021-09-24
  Administered 2021-09-19 – 2021-09-23 (×16): 2 g via INTRAVENOUS
  Filled 2021-09-18 (×16): qty 12.5

## 2021-09-18 MED ORDER — LACTATED RINGERS IV BOLUS (SEPSIS)
1000.0000 mL | Freq: Once | INTRAVENOUS | Status: AC
  Administered 2021-09-18: 1000 mL via INTRAVENOUS

## 2021-09-18 MED ORDER — IOHEXOL 350 MG/ML SOLN
75.0000 mL | Freq: Once | INTRAVENOUS | Status: AC | PRN
Start: 2021-09-18 — End: 2021-09-18
  Administered 2021-09-18: 80 mL via INTRAVENOUS

## 2021-09-18 MED ORDER — ORAL CARE MOUTH RINSE
15.0000 mL | Freq: Two times a day (BID) | OROMUCOSAL | Status: DC
  Administered 2021-09-19 – 2021-09-25 (×12): 15 mL via OROMUCOSAL

## 2021-09-18 MED ORDER — SODIUM CHLORIDE 0.9 % IV SOLN
2.0000 g | Freq: Once | INTRAVENOUS | Status: AC
  Administered 2021-09-18: 2 g via INTRAVENOUS
  Filled 2021-09-18: qty 12.5

## 2021-09-18 MED ORDER — VANCOMYCIN HCL 750 MG/150ML IV SOLN
750.0000 mg | Freq: Two times a day (BID) | INTRAVENOUS | Status: DC
Start: 2021-09-19 — End: 2021-09-19
  Administered 2021-09-19: 750 mg via INTRAVENOUS
  Filled 2021-09-18: qty 150

## 2021-09-18 MED ORDER — SODIUM CHLORIDE 0.9 % IV SOLN
500.0000 mg | Freq: Once | INTRAVENOUS | Status: AC
  Administered 2021-09-18: 500 mg via INTRAVENOUS
  Filled 2021-09-18: qty 5

## 2021-09-18 MED ORDER — CHLORHEXIDINE GLUCONATE CLOTH 2 % EX PADS
6.0000 | MEDICATED_PAD | Freq: Every day | CUTANEOUS | Status: DC
  Administered 2021-09-18 – 2021-09-24 (×7): 6 via TOPICAL

## 2021-09-18 MED ORDER — VANCOMYCIN HCL IN DEXTROSE 1-5 GM/200ML-% IV SOLN
1000.0000 mg | Freq: Once | INTRAVENOUS | Status: AC
  Administered 2021-09-18: 1000 mg via INTRAVENOUS
  Filled 2021-09-18: qty 200

## 2021-09-18 MED ORDER — IPRATROPIUM-ALBUTEROL 0.5-2.5 (3) MG/3ML IN SOLN
3.0000 mL | Freq: Once | RESPIRATORY_TRACT | Status: AC
  Administered 2021-09-18: 3 mL via RESPIRATORY_TRACT
  Filled 2021-09-18: qty 3

## 2021-09-18 MED ORDER — HEPARIN (PORCINE) 25000 UT/250ML-% IV SOLN
1100.0000 [IU]/h | INTRAVENOUS | Status: DC
  Administered 2021-09-18: 1100 [IU]/h via INTRAVENOUS
  Filled 2021-09-18: qty 250

## 2021-09-18 MED ORDER — HEPARIN BOLUS VIA INFUSION
3000.0000 [IU] | Freq: Once | INTRAVENOUS | Status: AC
  Administered 2021-09-18: 3000 [IU] via INTRAVENOUS
  Filled 2021-09-18: qty 3000

## 2021-09-18 MED ORDER — LACTATED RINGERS IV SOLN: INTRAVENOUS | Status: AC

## 2021-09-18 NOTE — ED Triage Notes (Signed)
Pt bib from [facility] via [facility] d/t AMS and low O sat.  Pt minimally responsive. Per staff at facility concern for PNA.  ?

## 2021-09-18 NOTE — Progress Notes (Signed)
Pt transported on bipap from ED to CT and back to ED.  Pt tolerated transport well without incident. ?

## 2021-09-18 NOTE — ED Provider Notes (Signed)
[facility] DEPT Provider Note   CSN: [id number] Arrival date & time: 09/18/21  1537     History  Chief Complaint  Patient presents with   suspected sepsis     Benjamin Willis. is a 76 y.o. male.   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  Patient presents from nursing facility.  Concern for difficulty breathing, oxygen saturations over the past 24 hours.  Patient ports been feeling unwell the past 2 days.  He was recently hospitalized 2 weeks ago secondary to weakness/UTI.  Discharged back to nursing facility.  Patient reports compliant with her medications.  Worsening from breathing from his baseline, fevers and chills.  Ongoing malaise.  No abdominal pain nausea or vomiting.  Patient reports he does not use home oxygen but uses a CPAP at nighttime.  Patient hypoxic prior to arrival.     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 Gery Pray 10/30/2020: History of radiation therapy     Comment:  whole brain 10/17/2020-10/30/2020 Dr Gery Pray No date: Hyperlipidemia No date: Hypertension No date: Lung cancer [facility])     Comment:  Stage IA2 (cT1b, N0) non-small cell lung cancer               (adenocarcinoma)  Past Surgical History: 02/02/2019: ELECTROMAGNETIC NAVIGATION BROCHOSCOPY; Right     Comment:  Procedure: VIDEO BRONCHOSCOPY WITH NAVIGATION AND               FIDUCIAL PLACEMENT;  Surgeon: Garner Nash, DO;                Location: [facility];  Service: Cardiopulmonary;  Laterality:               Right; 02/02/2019: FUDUCIAL PLACEMENT; Right     Comment:   Procedure: Placement Of Fuducial Right Upper Lobe;                Surgeon: Garner Nash, DO;  Location: [place];                Service: Cardiopulmonary;  Laterality: Right; 12/13/2020: IR IMAGING GUIDED PORT INSERTION No date: none 02/02/2019: VIDEO BRONCHOSCOPY WITH RADIAL ENDOBRONCHIAL ULTRASOUND;  Right     Comment:  Procedure: Video Bronchoscopy With Radial Endobronchial               Ultrasound;  Surgeon: Garner Nash, DO;  Location: [place];  Service: Cardiopulmonary;  Laterality: Right;    The history is provided by the patient. No language interpreter was used.      Home Medications Prior to Admission medications   Medication Sig Start Date End Date Taking? Authorizing Provider  albuterol (VENTOLIN HFA) 108 (90 Base) MCG/ACT inhaler Inhale 2 puffs into the lungs every 6 (six) hours as needed for wheezing or shortness of breath. 01/19/19  Yes Icard, Octavio Graves, DO  calcium carbonate (TUMS - DOSED IN MG ELEMENTAL CALCIUM) 500 MG chewable tablet Chew 2 tablets by mouth daily.  Yes [provider]  dexamethasone (DECADRON) 4 MG tablet Take 1 tablet (4 mg total) by mouth every 12 (twelve) hours. Patient taking differently: Take 4 mg by mouth in the morning and at bedtime. 07/15/21  Yes Regalado, Belkys A, MD  empagliflozin (JARDIANCE) 10 MG TABS tablet Take 1 tablet (10 mg total) by mouth daily before breakfast. 08/20/21 02/16/22 Yes Sagardia, Ines Bloomer, MD  folic acid (FOLVITE) 1 MG tablet Take 1 tablet (1 mg total) by mouth daily. 05/01/21  Yes Sagardia, Ines Bloomer, MD  gabapentin (NEURONTIN) 100 MG capsule Take 1 capsule (100 mg total) by mouth 2 (two) times daily. 07/15/21  Yes Regalado, Belkys A, MD  guaiFENesin (MUCINEX) 600 MG 12 hr tablet Take 600 mg by mouth 2 (two) times daily. For 7 days   Yes [provider]  ipratropium-albuterol (DUONEB) 0.5-2.5 (3) MG/3ML SOLN Take 3 mLs by nebulization in the morning, at noon, in the evening, and at  bedtime. For 5 days   Yes [provider]  latanoprost (XALATAN) 0.005 % ophthalmic solution Place 1 drop into both eyes at bedtime. 01/24/21  Yes Ailene Ards, NP  metFORMIN (GLUCOPHAGE) 500 MG tablet TAKE 1 TABLET BY MOUTH TWICE DAILY WITH MEALS Patient taking differently: Take 500 mg by mouth 2 (two) times daily with a meal. 06/25/21  Yes Sagardia, Ines Bloomer, MD  metoprolol tartrate (LOPRESSOR) 25 MG tablet Take 1 tablet (25 mg total) by mouth 2 (two) times daily. 09/10/21  Yes Rai, Ripudeep K, MD  multivitamin (ONE-A-DAY MEN'S) TABS tablet Take 1 tablet by mouth daily with breakfast.   Yes [provider]  Nutritional Supplement LIQD Take 120 mLs by mouth 3 (three) times daily.   Yes [provider]  pantoprazole (PROTONIX) 40 MG tablet Take 1 tablet (40 mg total) by mouth daily. Patient taking differently: Take 40 mg by mouth daily before breakfast. 05/01/21  Yes Sagardia, Ines Bloomer, MD  rosuvastatin (CRESTOR) 20 MG tablet Take 20 mg by mouth every evening.   Yes [provider]  tirzepatide Darcel Bayley) 5 MG/0.5ML Pen Inject 5 mg into the skin once a week. Patient taking differently: Inject 5 mg into the skin every Monday. 08/20/21  Yes Sagardia, Ines Bloomer, MD  blood glucose meter kit and supplies KIT Dispense based on patient and insurance preference. Use up to four times daily as directed. 10/23/20   Elodia Florence., MD  Blood Pressure Monitoring (BLOOD PRESSURE MONITOR/ARM) DEVI Check BP once a day. DX I 10 04/18/19   Horald Pollen, MD  cefTRIAXone (ROCEPHIN) 1 g injection Inject 1 g into the muscle once.    [provider]  diphenhydrAMINE-zinc acetate (BENADRYL) cream Apply topically 2 (two) times daily as needed for itching. Patient not taking: Reported on 09/18/2021 07/15/21   Niel Hummer A, MD  levETIRAcetam (KEPPRA) 1000 MG tablet Take 1 tablet by mouth twice daily Patient not taking: Reported on 09/18/2021 05/29/21    Horald Pollen, MD  sodium chloride 0.9 % infusion Inject 500 mLs into the vein once. 50 ml/hr for hydration    [provider]      Allergies    Patient has no known allergies.    Review of Systems   Review of Systems  Constitutional:  Positive for chills, fatigue and fever.  HENT:  Negative for facial swelling and trouble swallowing.   Eyes:  Negative for photophobia and visual disturbance.  Respiratory:  Positive for cough and shortness of breath.  Cardiovascular:  Positive for chest pain. Negative for palpitations.  Gastrointestinal:  Negative for abdominal pain, nausea and vomiting.  Endocrine: Negative for polydipsia and polyuria.  Genitourinary:  Negative for difficulty urinating and hematuria.  Musculoskeletal:  Negative for gait problem and joint swelling.  Skin:  Negative for pallor and rash.  Neurological:  Negative for syncope and headaches.  Psychiatric/Behavioral:  Negative for agitation and confusion.    Physical Exam Updated Vital Signs BP (!) 146/78 (BP Location: Right Arm)   Pulse (!) 109   Temp 98.3 F (36.8 C) (Axillary)   Resp 16   Wt 63 kg   SpO2 97%   BMI 22.42 kg/m  Physical Exam Vitals and nursing note reviewed. Exam conducted with a chaperone present.  Constitutional:      General: He is in acute distress.     Appearance: He is well-developed. He is ill-appearing.     Comments: Frail  HENT:     Head: Normocephalic and atraumatic.     Right Ear: External ear normal.     Left Ear: External ear normal.     Mouth/Throat:     Mouth: Mucous membranes are moist.  Eyes:     General: No scleral icterus. Cardiovascular:     Rate and Rhythm: Normal rate and regular rhythm.     Pulses: Normal pulses.     Heart sounds: Normal heart sounds.  Pulmonary:     Effort: Tachypnea and respiratory distress present.     Breath sounds: Decreased air movement present. Decreased breath sounds present.     Comments: Adventitious breath sounds  bilateral Abdominal:     General: Abdomen is flat.     Palpations: Abdomen is soft.     Tenderness: There is no abdominal tenderness.  Musculoskeletal:        General: Normal range of motion.     Cervical back: Normal range of motion.     Right lower leg: No edema.     Left lower leg: No edema.  Skin:    General: Skin is warm and dry.     Capillary Refill: Capillary refill takes less than 2 seconds.  Neurological:     Mental Status: He is alert and oriented to person, place, and time.     GCS: GCS eye subscore is 4. GCS verbal subscore is 5. GCS motor subscore is 6.  Psychiatric:        Mood and Affect: Mood normal.        Behavior: Behavior normal.    ED Results / Procedures / Treatments   Labs (all labs ordered are listed, but only abnormal results are displayed) Labs Reviewed  COMPREHENSIVE METABOLIC PANEL - Abnormal; Notable for the following components:      Result Value   Glucose, Bld 210 (*)    Calcium 8.8 (*)    Albumin 2.4 (*)    AST 80 (*)    ALT 46 (*)    Alkaline Phosphatase 138 (*)    All other components within normal limits  LACTIC ACID, PLASMA - Abnormal; Notable for the following components:   Lactic Acid, Venous 2.0 (*)    All other components within normal limits  CBC WITH DIFFERENTIAL/PLATELET - Abnormal; Notable for the following components:   RBC 3.80 (*)    Hemoglobin 12.3 (*)    HCT 38.1 (*)    MCV 100.3 (*)    nRBC 8.3 (*)    Lymphs Abs 0.2 (*)    nRBC 7 (*)  All other components within normal limits  BLOOD GAS, VENOUS - Abnormal; Notable for the following components:   pCO2, Ven 42 (*)    pO2, Ven <32 (*)    All other components within normal limits  BRAIN NATRIURETIC PEPTIDE - Abnormal; Notable for the following components:   B Natriuretic Peptide 175.6 (*)    All other components within normal limits  GLUCOSE, CAPILLARY - Abnormal; Notable for the following components:   Glucose-Capillary 184 (*)    All other components within  normal limits  CBG MONITORING, ED - Abnormal; Notable for the following components:   Glucose-Capillary 216 (*)    All other components within normal limits  TROPONIN I (HIGH SENSITIVITY) - Abnormal; Notable for the following components:   Troponin I (High Sensitivity) 27 (*)    All other components within normal limits  TROPONIN I (HIGH SENSITIVITY) - Abnormal; Notable for the following components:   Troponin I (High Sensitivity) 29 (*)    All other components within normal limits  RESP PANEL BY RT-PCR (FLU A&B, COVID) ARPGX2  CULTURE, BLOOD (ROUTINE X 2)  CULTURE, BLOOD (ROUTINE X 2)  EXPECTORATED SPUTUM ASSESSMENT W GRAM STAIN, RFLX TO RESP C  MRSA NEXT GEN BY PCR, NASAL  LACTIC ACID, PLASMA  PROTIME-INR  APTT  URINALYSIS, ROUTINE W REFLEX MICROSCOPIC  LEGIONELLA PNEUMOPHILA SEROGP 1 UR AG  HEPARIN LEVEL (UNFRACTIONATED)  COMPREHENSIVE METABOLIC PANEL  MAGNESIUM    EKG EKG Interpretation  Date/Time:  Wednesday Sep 18 2021 15:48:16 EDT Ventricular Rate:  141 PR Interval:  55 QRS Duration: 121 QT Interval:  418 QTC Calculation: 563 R Axis:   100 Text Interpretation: Sinus tachycardia Aberrant complex Nonspecific intraventricular conduction delay Confirmed by Wynona Dove ([id number]) on 09/18/2021 5:50:56 PM  Radiology CT Angio Chest PE W and/or Wo Contrast  Result Date: 09/18/2021 CLINICAL DATA:  Short of breath, altered level of consciousness EXAM: CT ANGIOGRAPHY CHEST WITH CONTRAST TECHNIQUE: Multidetector CT imaging of the chest was performed using the standard protocol during bolus administration of intravenous contrast. Multiplanar CT image reconstructions and MIPs were obtained to evaluate the vascular anatomy. RADIATION DOSE REDUCTION: This exam was performed according to the departmental dose-optimization program which includes automated exposure control, adjustment of the mA and/or kV according to patient size and/or use of iterative reconstruction technique. CONTRAST:   66mL OMNIPAQUE IOHEXOL 350 MG/ML SOLN COMPARISON:  06/17/2021, 09/18/2021 FINDINGS: Cardiovascular: This is a technically adequate evaluation of the pulmonary vasculature. Nonocclusive pulmonary emboli are seen within the left upper lobe segmental branches. Minimal clot burden. No other filling defects. The heart is unremarkable without pericardial effusion. No evidence of thoracic aortic aneurysm or dissection. Stable atherosclerosis. Mediastinum/Nodes: Borderline enlarged mediastinal adenopathy measuring up to 9 mm in the right paratracheal region, likely reactive. There is circumferential wall thickening of the distal thoracic esophagus, which could reflect esophagitis. Thyroid and trachea are unremarkable. Lungs/Pleura: Interval development of bilateral interstitial and ground-glass opacities, favor multifocal pneumonia over edema. No effusion or pneumothorax. Chronic post therapeutic changes within the right upper lobe related to prior radiation therapy for lung cancer. Central airways are patent. Upper Abdomen: No acute abnormality. Stable nonspecific 11 mm right adrenal nodule, previously characterized as adenoma. Musculoskeletal: No acute or destructive bony lesions. Reconstructed images demonstrate no additional findings. Review of the MIP images confirms the above findings. IMPRESSION: 1. Small segmental left upper lobe pulmonary emboli. Minimal clot burden. No right heart strain. 2. Widespread bilateral interstitial and ground-glass airspace opacities, favor multifocal pneumonia over asymmetric  edema. 3. Interval development of borderline enlarged mediastinal adenopathy, likely reactive. 4. Mild circumferential wall thickening of the distal thoracic esophagus, new since prior study. Favor mild esophagitis. 5.  Aortic Atherosclerosis (ICD10-I70.0). Critical Value/emergent results were called by telephone at the time of interpretation on 09/18/2021 at 8:36 pm to provider Wynona Dove , who verbally  acknowledged these results. Electronically Signed   By: Randa Ngo M.D.   On: 09/18/2021 20:41   DG Chest Port 1 View  Result Date: 09/18/2021 CLINICAL DATA:  Altered mental status EXAM: PORTABLE CHEST 1 VIEW COMPARISON:  09/02/2021 FINDINGS: Right Port-A-Cath remains in place, unchanged. Heart is normal size. Bilateral airspace disease, right greater than left. No effusions or acute bony abnormality. IMPRESSION: Bilateral airspace disease, asymmetric to the right. Favor infection although asymmetric edema could have this appearance. Electronically Signed   By: Rolm Baptise M.D.   On: 09/18/2021 16:21    Procedures .Critical Care Performed by: Jeanell Sparrow, DO Authorized by: Jeanell Sparrow, DO   Critical care provider statement:    Critical care time (minutes):  81   Critical care time was exclusive of:  Separately billable procedures and treating other patients   Critical care was necessary to treat or prevent imminent or life-threatening deterioration of the following conditions:  Sepsis and respiratory failure   Critical care was time spent personally by me on the following activities:  Development of treatment plan with patient or surrogate, discussions with consultants, evaluation of patient's response to treatment, examination of patient, ordering and review of laboratory studies, ordering and review of radiographic studies, ordering and performing treatments and interventions, pulse oximetry, re-evaluation of patient's condition, review of old charts and obtaining history from patient or surrogate   Care discussed with: admitting provider      Medications Ordered in ED Medications  lactated ringers infusion ( Intravenous Rate/Dose Change 09/19/21 0044)  heparin ADULT infusion 100 units/mL (25000 units/262mL) (1,100 Units/hr Intravenous New Bag/Given 09/18/21 2207)  Chlorhexidine Gluconate Cloth 2 % PADS 6 each (has no administration in time range)  chlorhexidine (PERIDEX) 0.12 %  solution 15 mL (15 mLs Mouth Rinse Given 09/19/21 0003)  MEDLINE mouth rinse (has no administration in time range)  insulin aspart (novoLOG) injection 0-9 Units (has no administration in time range)  ceFEPIme (MAXIPIME) 2 g in sodium chloride 0.9 % 100 mL IVPB (0 g Intravenous Stopped 09/19/21 0043)  vancomycin (VANCOREADY) IVPB 750 mg/150 mL (has no administration in time range)  morphine (PF) 2 MG/ML injection 0.5 mg (has no administration in time range)  potassium chloride 10 mEq in 100 mL IVPB (has no administration in time range)  potassium chloride 10 MEQ/100ML IVPB (has no administration in time range)  vancomycin (VANCOCIN) IVPB 1000 mg/200 mL premix (0 mg Intravenous Stopped 09/18/21 1740)  ceFEPIme (MAXIPIME) 2 g in sodium chloride 0.9 % 100 mL IVPB (0 g Intravenous Stopped 09/18/21 1721)  azithromycin (ZITHROMAX) 500 mg in sodium chloride 0.9 % 250 mL IVPB (0 mg Intravenous Stopped 09/18/21 1807)  lactated ringers bolus 1,000 mL (0 mLs Intravenous Stopped 09/18/21 1721)  ipratropium-albuterol (DUONEB) 0.5-2.5 (3) MG/3ML nebulizer solution 3 mL (3 mLs Nebulization Given 09/18/21 1721)  iohexol (OMNIPAQUE) 350 MG/ML injection 75 mL (80 mLs Intravenous Contrast Given 09/18/21 2011)  heparin bolus via infusion 3,000 Units (3,000 Units Intravenous Bolus from Bag 09/18/21 2208)    ED Course/ Medical Decision Making/ A&P Clinical Course as of 09/19/21 0053  Wed Sep 18, 2021  1640 Resp status  improved w/ bipap [SG]    Clinical Course User Index [SG] Jeanell Sparrow, DO                           Medical Decision Making Amount and/or Complexity of Data Reviewed Labs: ordered. Radiology: ordered. ECG/medicine tests: ordered.  Risk Prescription drug management. Decision regarding hospitalization.    CC: DIB  This patient presents to the Emergency Department for the above complaint. This involves an extensive number of treatment options and is a complaint that carries with it a high risk  of complications and morbidity. Vital signs were reviewed. Serious etiologies considered.  Differential includes but not limited to pneumonia, ACS, pneumothorax, MSK, sepsis, viral, dissection, other acute thoracic etiology were considered  Record review:  Previous records obtained and reviewed  Recent hospitalization, prior labs and imaging  Additional history obtained from [facility] and surgical history as noted above.   Work up as above, notable for:  Labs & imaging results that were available during my care of the patient were visualized by me and considered in my medical decision making.   I ordered imaging studies which included chest x-ray CT PE and I visualized the imaging and I agree with radiologist interpretation.  CT PE with single segmental PE.  No right heart strain.  Multifocal pneumonia noted on imaging.  Cardiac monitoring reviewed and interpreted personally which shows sinus tachycardia  Patient with sepsis likely secondary to pneumonia, broad-spectrum antibiotics started.  Will start heparin as well given PE.  Patient is elevated troponin, no chest pain but does have dyspnea.  EKG without ischemic changes.  Burtis Junes this is demand ischemia secondary to respiratory distress.  Management: Patient with low-grade fever, tachycardic, hypoxic.  High suspicion for sepsis; favor pulmonary source given hypoxia.  Code sepsis activated.  Start broad-spectrum antibiotics after obtaining blood cultures.  Reassessment:  Patient feeling better after IV fluids, BiPAP.  Respiratory status improved.  Given the above recommend mission.  Patient is agreeable.  Spoke with Dr. Flossie Buffy who accepts patient for admission.  >> Patient with metastatic disease, would recommend palliative consult during this admission.  Patient is full code                Social determinants of health include -  Social History   Socioeconomic History   Marital status: Single    Spouse name: Not  on file   Number of children: 0   Years of education: Not on file   Highest education level: 9th grade  Occupational History   Not on file  Tobacco Use   Smoking status: Former    Packs/day: 1.00    Years: 48.00    Pack years: 48.00    Types: Cigarettes    Quit date: 02/20/2015    Years since quitting: 6.5   Smokeless tobacco: Former    Quit date: 10/2013  Vaping Use   Vaping Use: Never used  Substance and Sexual Activity   Alcohol use: No   Drug use: No   Sexual activity: Yes    Birth control/protection: Condom  Other Topics Concern   Not on file  Social History Narrative   Not on file   Social Determinants of Health   Financial Resource Strain: Not on file  Food Insecurity: Not on file  Transportation Needs: Not on file  Physical Activity: Not on file  Stress: Not on file  Social Connections: Not on file  Intimate Partner  Violence: Not on file      This chart was dictated using voice recognition software.  Despite best efforts to proofread,  errors can occur which can change the documentation meaning.         Final Clinical Impression(s) / ED Diagnoses Final diagnoses:  Sepsis, due to unspecified organism, unspecified whether acute organ dysfunction present [facility])  Single subsegmental pulmonary embolism without acute cor pulmonale ([place])  Community acquired pneumonia, unspecified laterality  Acute respiratory failure with hypoxia [facility])    Rx / DC Orders ED Discharge Orders     None         Jeanell Sparrow, DO 09/19/21 2883

## 2021-09-18 NOTE — Progress Notes (Signed)
RT NOTE: ? ?Pt placed on BiPAP per EDP order. Pt states he is comfortable on the machine at this time.  ?

## 2021-09-18 NOTE — H&P (Signed)
History and Physical    Patient: Benjamin Willis. UXL:[id number] DOB: 12-08-1945 DOA: 09/18/2021 DOS: the patient was seen and examined on 09/19/2021 PCP: Horald Pollen, MD  Patient coming from: SNF  Chief Complaint:  Chief Complaint  Patient presents with   suspected sepsis    HPI: Benjamin Willis. is a 76 y.o. 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  Unable to provide full history from patient while he is on BiPAP.  Reports increasing shortness of breath with cough for the past 2 days.  Last admitted from 5/1-09/10/21 with acute metabolic encephalopathy secondary to UTI and had physical deconditioning with weakness and falls and was discharged to SNF for rehab.   In the ED, he had temperature of 99.9 F, was tachycardic and was hypoxic down to 80% initially placed on nonrebreather and ultimately transitioned to BiPAP.  No leukocytosis, slight anemia with hemoglobin of 12.3.  Lactate of 2. CMP with CBG of 210 and elevated AST of 80, ALT of 46, alkaline phosphatase of 138. Troponin mildly elevated at 27. Negative flu and COVID PCR. CTA chest showing small segmental left upper lobe pulmonary emboli with minimal clot burden and no right heart strain.  There is bilateral interstitial and groundglass opacity favoring multifocal pneumonia.  He was given IV vancomycin, cefepime and azithromycin.  Hospitalist called for admission. Review of Systems: unable to review all systems due to the inability of the patient to answer questions. Past Medical History:  Diagnosis Date   Allergy    Diabetes mellitus without complication ([place])    Type II   Glaucoma 10/16/2020   History of radiation therapy    Thorax- SBRT 02/21/19-02/28/19, Rt Lung- IMRT 02/29/20-04/16/20 Dr. Gery Pray   History of radiation therapy 10/30/2020   whole brain 10/17/2020-10/30/2020 Dr Gery Pray    Hyperlipidemia    Hypertension    Lung cancer ([place])    Stage IA2 (cT1b, N0) non-small cell lung cancer (adenocarcinoma)   Past Surgical History:  Procedure Laterality Date   ELECTROMAGNETIC NAVIGATION BROCHOSCOPY Right 02/02/2019   Procedure: VIDEO BRONCHOSCOPY WITH NAVIGATION AND FIDUCIAL PLACEMENT;  Surgeon: Garner Nash, DO;  Location: [facility];  Service: Cardiopulmonary;  Laterality: Right;   FUDUCIAL PLACEMENT Right 02/02/2019   Procedure: Placement Of Fuducial Right Upper Lobe;  Surgeon: Garner Nash, DO;  Location: [facility];  Service: Cardiopulmonary;  Laterality: Right;   IR IMAGING GUIDED PORT INSERTION  12/13/2020   none     VIDEO BRONCHOSCOPY WITH RADIAL ENDOBRONCHIAL ULTRASOUND Right 02/02/2019   Procedure: Video Bronchoscopy With Radial Endobronchial Ultrasound;  Surgeon: Garner Nash, DO;  Location: [facility];  Service: Cardiopulmonary;  Laterality: Right;   Social History:  reports that he quit smoking about 6 years ago. His smoking use included cigarettes. He has a 48.00 pack-year smoking history. He quit smokeless tobacco use about 7 years ago. He reports that he does not drink alcohol and does not use drugs.  No Known Allergies  Family History  Problem Relation Age of Onset   Lung cancer Neg Hx     Prior to Admission medications   Medication Sig Start Date End Date Taking? Authorizing Provider  albuterol (VENTOLIN HFA) 108 (90 Base) MCG/ACT inhaler Inhale 2 puffs into the lungs every 6 (six) hours as needed for wheezing or shortness of breath. 01/19/19  Yes Icard, Bradley L, DO  calcium carbonate (TUMS - DOSED IN  MG ELEMENTAL CALCIUM) 500 MG chewable tablet Chew 2 tablets by mouth daily.   Yes [provider]  dexamethasone (DECADRON) 4 MG tablet Take 1 tablet (4 mg total) by mouth every 12 (twelve) hours. Patient taking differently: Take 4 mg by mouth in the morning and at bedtime. 07/15/21  Yes Regalado, Belkys A, MD  empagliflozin (JARDIANCE) 10 MG TABS tablet  Take 1 tablet (10 mg total) by mouth daily before breakfast. 08/20/21 02/16/22 Yes Sagardia, Ines Bloomer, MD  folic acid (FOLVITE) 1 MG tablet Take 1 tablet (1 mg total) by mouth daily. 05/01/21  Yes Sagardia, Ines Bloomer, MD  gabapentin (NEURONTIN) 100 MG capsule Take 1 capsule (100 mg total) by mouth 2 (two) times daily. 07/15/21  Yes Regalado, Belkys A, MD  guaiFENesin (MUCINEX) 600 MG 12 hr tablet Take 600 mg by mouth 2 (two) times daily. For 7 days   Yes [provider]  ipratropium-albuterol (DUONEB) 0.5-2.5 (3) MG/3ML SOLN Take 3 mLs by nebulization in the morning, at noon, in the evening, and at bedtime. For 5 days   Yes [provider]  latanoprost (XALATAN) 0.005 % ophthalmic solution Place 1 drop into both eyes at bedtime. 01/24/21  Yes Ailene Ards, NP  metFORMIN (GLUCOPHAGE) 500 MG tablet TAKE 1 TABLET BY MOUTH TWICE DAILY WITH MEALS Patient taking differently: Take 500 mg by mouth 2 (two) times daily with a meal. 06/25/21  Yes Sagardia, Ines Bloomer, MD  metoprolol tartrate (LOPRESSOR) 25 MG tablet Take 1 tablet (25 mg total) by mouth 2 (two) times daily. 09/10/21  Yes Rai, Ripudeep K, MD  multivitamin (ONE-A-DAY MEN'S) TABS tablet Take 1 tablet by mouth daily with breakfast.   Yes [provider]  Nutritional Supplement LIQD Take 120 mLs by mouth 3 (three) times daily.   Yes [provider]  pantoprazole (PROTONIX) 40 MG tablet Take 1 tablet (40 mg total) by mouth daily. Patient taking differently: Take 40 mg by mouth daily before breakfast. 05/01/21  Yes Sagardia, Ines Bloomer, MD  rosuvastatin (CRESTOR) 20 MG tablet Take 20 mg by mouth every evening.   Yes [provider]  tirzepatide Darcel Bayley) 5 MG/0.5ML Pen Inject 5 mg into the skin once a week. Patient taking differently: Inject 5 mg into the skin every Monday. 08/20/21  Yes Sagardia, Ines Bloomer, MD  blood glucose meter kit and supplies KIT Dispense based on patient and insurance preference.  Use up to four times daily as directed. 10/23/20   Elodia Florence., MD  Blood Pressure Monitoring (BLOOD PRESSURE MONITOR/ARM) DEVI Check BP once a day. DX I 10 04/18/19   Horald Pollen, MD  cefTRIAXone (ROCEPHIN) 1 g injection Inject 1 g into the muscle once.    [provider]  diphenhydrAMINE-zinc acetate (BENADRYL) cream Apply topically 2 (two) times daily as needed for itching. Patient not taking: Reported on 09/18/2021 07/15/21   Niel Hummer A, MD  levETIRAcetam (KEPPRA) 1000 MG tablet Take 1 tablet by mouth twice daily Patient not taking: Reported on 09/18/2021 05/29/21   Horald Pollen, MD  sodium chloride 0.9 % infusion Inject 500 mLs into the vein once. 50 ml/hr for hydration    [provider]    Physical Exam: Vitals:   09/18/21 2304 09/19/21 0000 09/19/21 0100 09/19/21 0200  BP:  (!) 146/78 (!) 149/72 (!) 157/90  Pulse: (!) 115 (!) 109 (!) 107 (!) 118  Resp:  $Remo'16 12 16  'PJhLn$ Temp:  98.3 F (36.8 C)  TempSrc:  Axillary    SpO2: 93% 97% 96% 100%  Weight:       Constitutional: Thin cachectic chronically ill appearing elderly male laying in bed asleep comfortable with BiPAP Eyes: Plids and conjunctivae normal ENMT: Mucous membranes are moist.  Neck: normal, supple Respiratory: clear to auscultation anteriorly but could not get up due to weakness for posterior auscultation.  Normal respiratory effort on BiPAP.  No accessory muscle use.  Cardiovascular: Regular rate and rhythm, no murmurs / rubs / gallops. No extremity edema.  Abdomen: no tenderness, no masses palpated.  Bowel sounds positive.  Musculoskeletal: no clubbing / cyanosis. No joint deformity upper and lower extremities. Good ROM, no contractures.  Muscle wasting on all extremities. Skin: no rashes, lesions, ulcers. No induration Neurologic: CN 2-12 grossly intact.  Strength 4 out of 5 in the lower extremity. Psychiatric: Normal judgment and insight. Alert and oriented x 3. Normal  mood. Data Reviewed:  See HPI  Assessment and Plan: * Acute respiratory failure with hypoxia ([place]) Presented with hypoxia down to 80% requiring BiPAP secondary to pulmonary embolism and multifocal pneumonia - CTA chest showing small subsegmental left upper lobe pulmonary embolism with minimal clot.  No heart strain.  Bilateral interstitial groundglass opacity favoring multifocal pneumonia -Continue IV vancomycin and cefepime -Continue IV heparin infusion -Wean as tolerated maintaining O2 sat of > 92%  Non-small cell lung cancer metastatic to brain [facility]) S/p whole brain radiation on chemotherapy -Followed by Dr. Lorenso Courier with oncology -Last chemo cycle 13 on 4/20 -Continue BID decadron -continue BID Keppra- although this was noted to not be on his MAR from SNF for unclear reason  Abnormal LFTs elevated AST of 80, ALT of 46, alkaline phosphatase of 138  -unclear etiology, could be transient due to infection.Benign abdominal exam. Follow repeat CMP in the morning.   Pressure injury of skin Stage II buttocks ulcer - Consult wound care  Uncontrolled type 2 diabetes mellitus with hyperglycemia (HCC) Recent hemoglobin A1c of 10.6 on 5/4 -He is also on BID decadron for brain metastasis which also contributes to his hyperglycemia -Placed on sensitive SSI      Advance Care Planning:   Code Status: Full Code   Consults: none  Family Communication: No family at bedside  Severity of Illness: The appropriate patient status for this patient is INPATIENT. Inpatient status is judged to be reasonable and necessary in order to provide the required intensity of service to ensure the patient's safety. The patient's presenting symptoms, physical exam findings, and initial radiographic and laboratory data in the context of their chronic comorbidities is felt to place them at high risk for further clinical deterioration. Furthermore, it is not anticipated that the patient will be medically stable  for discharge from the hospital within 2 midnights of admission.   * I certify that at the point of admission it is my clinical judgment that the patient will require inpatient hospital care spanning beyond 2 midnights from the point of admission due to high intensity of service, high risk for further deterioration and high frequency of surveillance required.*  Author: Orene Desanctis, DO 09/19/2021 3:08 AM  For on call review [email or web address].

## 2021-09-18 NOTE — Sepsis Progress Note (Signed)
Code Sepsis protocol being monitored by eLink. 

## 2021-09-18 NOTE — Progress Notes (Signed)
ANTICOAGULATION CONSULT NOTE - Initial Consult ? ?Pharmacy Consult for Heparin ?Indication: pulmonary embolus ? ?No Known Allergies ? ?Patient Measurements: ?  ?Height: 66 inches ?Heparin Dosing Weight: 62.2 kg on 08/30/21 ? ?Vital Signs: ?Temp: 99.9 ?F (37.7 ?C) (05/17 1557) ?Temp Source: Rectal (05/17 1557) ?BP: 120/87 (05/17 2030) ?Pulse Rate: 120 (05/17 2030) ? ?Labs: ?Recent Labs  ?  09/18/21 ?1600 09/18/21 ?1611  ?HGB Benjamin.3*  --   ?HCT 38.1*  --   ?PLT 163  --   ?APTT 25  --   ?LABPROT 14.2  --   ?INR 1.1  --   ?CREATININE 0.81  --   ?TROPONINIHS  --  27*  ? ? ?CrCl cannot be calculated (Unknown ideal weight.). ? ? ?Medical History: ?Past Medical History:  ?Diagnosis Date  ? Allergy   ? Diabetes mellitus without complication ([place])   ? Type II  ? Glaucoma 10/16/2020  ? History of radiation therapy   ? Thorax- SBRT 02/21/19-02/28/19, Rt Lung- IMRT 02/29/20-Benjamin/13/21 Dr. Gery Pray  ? History of radiation therapy 10/30/2020  ? whole brain 10/17/2020-10/30/2020 Dr Gery Pray  ? Hyperlipidemia   ? Hypertension   ? Lung cancer ([place])   ? Stage IA2 (cT1b, N0) non-small cell lung cancer (adenocarcinoma)  ? ? ?Medications:  ?Scheduled:  ?Infusions:  ? lactated ringers 150 mL/hr at 09/18/21 1722  ? ?PRN:  ? ?Assessment: ?76 yo Willis with metastatic lung cancer presents with difficulty breathing.  CTa positive for PE.  Pharmacy consulted to dose IV heparin.  No anticoagulants noted PTA. ? ?Goal of Therapy:  ?Heparin level 0.3-0.7 units/ml ?Monitor platelets by anticoagulation protocol: Yes ?  ?Plan:  ?Give 3000 units bolus x 1 ?Start heparin infusion at 1100 units/hr ?Check anti-Xa level in 8 hours and daily while on heparin ?Continue to monitor H&H and platelets ? ?Peggyann Juba, PharmD, BCPS ?Pharmacy: [phone] ?09/18/2021,8:45 PM ? ? ?

## 2021-09-18 NOTE — Progress Notes (Signed)
A consult was received from an ED physician for vancomycin and cefepime per pharmacy dosing.  The patient's profile has been reviewed for ht/wt/allergies/indication/available labs.   ?A one time order has been placed for vancomycin 1g and cefepime 2g.  Further antibiotics/pharmacy consults should be ordered by admitting physician if indicated.       ?                ?Thank you, ?Peggyann Juba, PharmD, BCPS ?09/18/2021  4:15 PM ? ?

## 2021-09-18 NOTE — Progress Notes (Signed)
Pharmacy Antibiotic Note ? ?Benjamin Willis. is a 76 y.o. male admitted on 09/18/2021  febrile and tachycardic. 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 CTA chest  shows  bilateral interstitial and groundglass opacity favoring multifocal pneumonia.Pharmacy has been consulted for vancomycin and cefepime dosing.  1st doses given in the ED ? ?Plan: ?Vancomycin 750mg  IV q12h (AUC 527, Scr 0.8, TBW) ?Cefepime 2gm IV q8h ?Follow renal function and clinical course ? ?  ? ?Temp (24hrs), Avg:99.9 ?F (37.7 ?C), Min:99.9 ?F (37.7 ?C), Max:99.9 ?F (37.7 ?C) ? ?Recent Labs  ?Lab 09/18/21 ?1600  ?WBC 7.8  ?CREATININE 0.81  ?LATICACIDVEN 2.0*  ?  ?CrCl cannot be calculated (Unknown ideal weight.).   ? ?No Known Allergies ? ?Antimicrobials this admission: ?5/17 vanc >> ?5/17 cefepime >> ?5/17 azith x 1 ? ?Dose adjustments this admission: ? ? ?Microbiology results: ?5/17 BCx: ?5/17 Sputum:   ?5/17 MRSA PCR:  ? ?Thank you for allowing pharmacy to be a part of this patient?s care. ? ?Dolly Rias RPh ?09/18/2021, 11:26 PM ? ?

## 2021-09-19 ENCOUNTER — Inpatient Hospital Stay (HOSPITAL_COMMUNITY): Payer: Medicare HMO

## 2021-09-19 DIAGNOSIS — J189 Pneumonia, unspecified organism: Secondary | ICD-10-CM

## 2021-09-19 DIAGNOSIS — C3490 Malignant neoplasm of unspecified part of unspecified bronchus or lung: Secondary | ICD-10-CM | POA: Diagnosis not present

## 2021-09-19 DIAGNOSIS — I2699 Other pulmonary embolism without acute cor pulmonale: Secondary | ICD-10-CM

## 2021-09-19 DIAGNOSIS — L8990 Pressure ulcer of unspecified site, unspecified stage: Secondary | ICD-10-CM | POA: Insufficient documentation

## 2021-09-19 DIAGNOSIS — J9601 Acute respiratory failure with hypoxia: Secondary | ICD-10-CM | POA: Diagnosis not present

## 2021-09-19 DIAGNOSIS — R7989 Other specified abnormal findings of blood chemistry: Secondary | ICD-10-CM

## 2021-09-19 DIAGNOSIS — C7931 Secondary malignant neoplasm of brain: Secondary | ICD-10-CM | POA: Diagnosis not present

## 2021-09-19 LAB — GLUCOSE, CAPILLARY
Glucose-Capillary: 111 mg/dL — ABNORMAL HIGH (ref 70–99)
Glucose-Capillary: 124 mg/dL — ABNORMAL HIGH (ref 70–99)
Glucose-Capillary: 148 mg/dL — ABNORMAL HIGH (ref 70–99)
Glucose-Capillary: 184 mg/dL — ABNORMAL HIGH (ref 70–99)
Glucose-Capillary: 94 mg/dL (ref 70–99)

## 2021-09-19 LAB — URINALYSIS, ROUTINE W REFLEX MICROSCOPIC
Bilirubin Urine: NEGATIVE
Glucose, UA: >=500 mg/dL — AB
Ketones, ur: 20 mg/dL — AB
Leukocytes,Ua: NEGATIVE
Nitrite: NEGATIVE
Protein, ur: 100 mg/dL — AB
Specific Gravity, Urine: 1.027 (ref 1.005–1.030)
pH: 5 (ref 5.0–8.0)

## 2021-09-19 LAB — COMPREHENSIVE METABOLIC PANEL
ALT: 38 U/L (ref 0–44)
AST: 75 U/L — ABNORMAL HIGH (ref 15–41)
Albumin: 2.1 g/dL — ABNORMAL LOW (ref 3.5–5.0)
Alkaline Phosphatase: 126 U/L (ref 38–126)
Anion gap: 12 (ref 5–15)
BUN: 13 mg/dL (ref 8–23)
CO2: 23 mmol/L (ref 22–32)
Calcium: 8.6 mg/dL — ABNORMAL LOW (ref 8.9–10.3)
Chloride: 106 mmol/L (ref 98–111)
Creatinine, Ser: 0.61 mg/dL (ref 0.61–1.24)
GFR, Estimated: >60 mL/min (ref >60)
Glucose, Bld: 170 mg/dL — ABNORMAL HIGH (ref 70–99)
Potassium: 3.8 mmol/L (ref 3.5–5.1)
Sodium: 141 mmol/L (ref 135–145)
Total Bilirubin: 1.1 mg/dL (ref 0.3–1.2)
Total Protein: 5.9 g/dL — ABNORMAL LOW (ref 6.5–8.1)

## 2021-09-19 LAB — MRSA NEXT GEN BY PCR, NASAL: MRSA by PCR Next Gen: NOT DETECTED

## 2021-09-19 LAB — CBC
HCT: 31.3 % — ABNORMAL LOW (ref 39.0–52.0)
Hemoglobin: 10.4 g/dL — ABNORMAL LOW (ref 13.0–17.0)
MCH: 32.7 pg (ref 26.0–34.0)
MCHC: 33.2 g/dL (ref 30.0–36.0)
MCV: 98.4 fL (ref 80.0–100.0)
Platelets: 171 10*3/uL (ref 150–400)
RBC: 3.18 MIL/uL — ABNORMAL LOW (ref 4.22–5.81)
RDW: 15 % (ref 11.5–15.5)
WBC: 6.1 10*3/uL (ref 4.0–10.5)
nRBC: 5.7 % — ABNORMAL HIGH (ref 0.0–0.2)

## 2021-09-19 LAB — HEPARIN LEVEL (UNFRACTIONATED)
Heparin Unfractionated: 0.14 IU/mL — ABNORMAL LOW (ref 0.30–0.70)
Heparin Unfractionated: 0.11 IU/mL — ABNORMAL LOW (ref 0.30–0.70)

## 2021-09-19 LAB — MAGNESIUM: Magnesium: 1.9 mg/dL (ref 1.7–2.4)

## 2021-09-19 LAB — LACTIC ACID, PLASMA: Lactic Acid, Venous: 1.8 mmol/L (ref 0.5–1.9)

## 2021-09-19 LAB — TROPONIN I (HIGH SENSITIVITY): Troponin I (High Sensitivity): 29 ng/L — ABNORMAL HIGH (ref <18)

## 2021-09-19 MED ORDER — ROSUVASTATIN CALCIUM 20 MG PO TABS
20.0000 mg | ORAL_TABLET | Freq: Every evening | ORAL | Status: DC
  Filled 2021-09-19: qty 1

## 2021-09-19 MED ORDER — LIP MEDEX EX OINT: TOPICAL_OINTMENT | CUTANEOUS | Status: DC | PRN

## 2021-09-19 MED ORDER — LATANOPROST 0.005 % OP SOLN
1.0000 [drp] | Freq: Every day | OPHTHALMIC | Status: DC
  Administered 2021-09-19 – 2021-09-23 (×5): 1 [drp] via OPHTHALMIC
  Filled 2021-09-19: qty 2.5

## 2021-09-19 MED ORDER — HEPARIN (PORCINE) 25000 UT/250ML-% IV SOLN
1300.0000 [IU]/h | INTRAVENOUS | Status: DC
  Administered 2021-09-19: 1300 [IU]/h via INTRAVENOUS
  Filled 2021-09-19: qty 250

## 2021-09-19 MED ORDER — HEPARIN (PORCINE) 25000 UT/250ML-% IV SOLN
1500.0000 [IU]/h | INTRAVENOUS | Status: DC
  Administered 2021-09-20: 1500 [IU]/h via INTRAVENOUS
  Filled 2021-09-19: qty 250

## 2021-09-19 MED ORDER — METOPROLOL TARTRATE 5 MG/5ML IV SOLN
5.0000 mg | Freq: Four times a day (QID) | INTRAVENOUS | Status: DC
Start: 2021-09-19 — End: 2021-09-23
  Administered 2021-09-19 – 2021-09-23 (×16): 5 mg via INTRAVENOUS
  Filled 2021-09-19 (×17): qty 5

## 2021-09-19 MED ORDER — DEXAMETHASONE 2 MG PO TABS
4.0000 mg | ORAL_TABLET | Freq: Two times a day (BID) | ORAL | Status: DC
  Administered 2021-09-20: 4 mg via ORAL
  Filled 2021-09-19 (×2): qty 2

## 2021-09-19 MED ORDER — SODIUM CHLORIDE 0.9% FLUSH: 10.0000 mL | INTRAVENOUS | Status: DC | PRN

## 2021-09-19 MED ORDER — GABAPENTIN 100 MG PO CAPS
100.0000 mg | ORAL_CAPSULE | Freq: Two times a day (BID) | ORAL | Status: DC
  Administered 2021-09-20: 100 mg via ORAL
  Filled 2021-09-19 (×2): qty 1

## 2021-09-19 MED ORDER — LEVETIRACETAM IN NACL 1000 MG/100ML IV SOLN
1000.0000 mg | Freq: Once | INTRAVENOUS | Status: AC
  Administered 2021-09-19: 1000 mg via INTRAVENOUS
  Filled 2021-09-19: qty 100

## 2021-09-19 MED ORDER — MORPHINE SULFATE (PF) 2 MG/ML IV SOLN
0.5000 mg | Freq: Once | INTRAVENOUS | Status: AC
  Administered 2021-09-19: 0.5 mg via INTRAVENOUS
  Filled 2021-09-19: qty 1

## 2021-09-19 MED ORDER — PANTOPRAZOLE SODIUM 40 MG PO TBEC
40.0000 mg | DELAYED_RELEASE_TABLET | Freq: Every day | ORAL | Status: DC
Start: 2021-09-19 — End: 2021-09-22

## 2021-09-19 MED ORDER — LEVETIRACETAM 500 MG PO TABS
1000.0000 mg | ORAL_TABLET | Freq: Two times a day (BID) | ORAL | Status: DC
  Administered 2021-09-20: 1000 mg via ORAL
  Filled 2021-09-19 (×2): qty 2

## 2021-09-19 MED ORDER — HEPARIN BOLUS VIA INFUSION
2000.0000 [IU] | Freq: Once | INTRAVENOUS | Status: AC
  Administered 2021-09-19: 2000 [IU] via INTRAVENOUS
  Filled 2021-09-19: qty 2000

## 2021-09-19 MED ORDER — FOLIC ACID 1 MG PO TABS: 1.0000 mg | ORAL_TABLET | Freq: Every day | ORAL | Status: DC

## 2021-09-19 MED ORDER — SODIUM CHLORIDE 0.9% FLUSH
10.0000 mL | Freq: Two times a day (BID) | INTRAVENOUS | Status: DC
  Administered 2021-09-19 (×2): 10 mL
  Administered 2021-09-19: 30 mL
  Administered 2021-09-20 – 2021-09-24 (×8): 10 mL

## 2021-09-19 MED ORDER — POTASSIUM CHLORIDE 10 MEQ/100ML IV SOLN: 10.0000 meq | Freq: Once | INTRAVENOUS | Status: AC

## 2021-09-19 MED ORDER — DEXAMETHASONE SODIUM PHOSPHATE 4 MG/ML IJ SOLN
4.0000 mg | Freq: Once | INTRAMUSCULAR | Status: AC
  Administered 2021-09-19: 4 mg via INTRAVENOUS
  Filled 2021-09-19: qty 1

## 2021-09-19 MED ORDER — POTASSIUM CHLORIDE 10 MEQ/100ML IV SOLN
INTRAVENOUS | Status: AC
Start: 2021-09-19 — End: 2021-09-19
  Administered 2021-09-19: 10 meq via INTRAVENOUS
  Filled 2021-09-19: qty 100

## 2021-09-19 MED ORDER — METOPROLOL TARTRATE 25 MG PO TABS
25.0000 mg | ORAL_TABLET | Freq: Two times a day (BID) | ORAL | Status: DC
Start: 2021-09-19 — End: 2021-09-19

## 2021-09-19 NOTE — Progress Notes (Signed)
ANTICOAGULATION CONSULT NOTE  Pharmacy Consult for Heparin Indication: pulmonary embolus  No Known Allergies  Patient Measurements: Weight: 63 kg (138 lb 14.2 oz) Height: 66 inches Heparin Dosing Weight: 62.2 kg on 08/30/21  Vital Signs: Temp: 98.9 F (37.2 C) (05/18 0400) Temp Source: Axillary (05/18 0400) BP: 108/65 (05/18 0600) Pulse Rate: 115 (05/18 0300)  Labs: Recent Labs    09/18/21 1600 09/18/21 1611 09/18/21 2350 09/19/21 0153 09/19/21 0601  HGB 12.3*  --   --   --   --   HCT 38.1*  --   --   --   --   PLT 163  --   --   --   --   APTT 25  --   --   --   --   LABPROT 14.2  --   --   --   --   INR 1.1  --   --   --   --   HEPARINUNFRC  --   --   --   --  0.14*  CREATININE 0.81  --   --  0.61  --   TROPONINIHS  --  27* 29*  --   --      Estimated Creatinine Clearance: 72.2 mL/min (by C-G formula based on SCr of 0.61 mg/dL).  Assessment: 76 yo male with metastatic lung cancer presents with difficulty breathing.  CTa positive for PE.  Pharmacy consulted to dose IV heparin.  No anticoagulants noted PTA. 09/19/2021 First heparin level 0.14 - subtherapeutic after 3000 unit bolus and drip 1100 units/hr Baseline Hg 12.3, Baseline PLT 163 No problems with heparin infusion per RN No bleeding noted per RN  Goal of Therapy:  Heparin level 0.3-0.7 units/ml Monitor platelets by anticoagulation protocol: Yes   Plan:  2000 unit heparin bolus x 1 Increase heparin drip to 1300 units/hr Check heparin level 8 hrs after bolus Daily CBC & heparin level F/u transition to PO DOAC   Eudelia Bunch, Pharm.D 09/19/2021 7:41 AM

## 2021-09-19 NOTE — Consult Note (Signed)
[facility] CM Inpatient Consult   09/19/2021  Aquiles Ruffini 09-10-1945 [id number]  [place] [facility] CM)   Patient chart has been reviewed with noted unplanned 30 day readmission and high risk score for unplanned readmissions. Patient assessed for community [place] [facility] follow up needs. Per review, patient admitted from skilled nursing facility.  Plan: Will continue to follow for progression and disposition.   Of note, [facility] Care Management services does not replace or interfere with any services that are arranged by inpatient case management or social work.    Netta Cedars, MSN, RN [facility] Liaison Toll free office [phone]

## 2021-09-19 NOTE — Progress Notes (Signed)
Bilateral lower extremity venous duplex completed. Refer to "CV Proc" under chart review to view preliminary results.  09/19/2021 11:52 AM Kelby Aline., MHA, RVT, RDCS, RDMS

## 2021-09-19 NOTE — Progress Notes (Signed)
ANTICOAGULATION CONSULT NOTE  Pharmacy Consult for Heparin Indication: pulmonary embolus  No Known Allergies  Patient Measurements: Weight: 63 kg (138 lb 14.2 oz) Height: 66 inches Heparin Dosing Weight: 62.2 kg on 08/30/21  Vital Signs: Temp: 98.4 F (36.9 C) (05/18 1600) Temp Source: Axillary (05/18 1600) BP: 121/82 (05/18 1600) Pulse Rate: 97 (05/18 0700)  Labs: Recent Labs    09/18/21 1600 09/18/21 1611 09/18/21 2350 09/19/21 0153 09/19/21 0601 09/19/21 1720  HGB 12.3*  --   --   --   --  10.4*  HCT 38.1*  --   --   --   --  31.3*  PLT 163  --   --   --   --  171  APTT 25  --   --   --   --   --   LABPROT 14.2  --   --   --   --   --   INR 1.1  --   --   --   --   --   HEPARINUNFRC  --   --   --   --  0.14* 0.11*  CREATININE 0.81  --   --  0.61  --   --   TROPONINIHS  --  27* 29*  --   --   --      Estimated Creatinine Clearance: 72.2 mL/min (by C-G formula based on SCr of 0.61 mg/dL).  Assessment: 76 yo male with metastatic lung cancer presents with difficulty breathing.  CTa positive for PE.  Pharmacy consulted to dose IV heparin.  No anticoagulants noted PTA.  Today, 09/19/2021 Heparin level 0.11 - subtherapeutic after 1000 unit bolus and drip at 1300 units/hr Hg 10.4 Baseline PLT 171 No problems with heparin infusion per RN No bleeding noted per RN  Goal of Therapy:  Heparin level 0.3-0.7 units/ml Monitor platelets by anticoagulation protocol: Yes   Plan:  2000 unit heparin bolus x 1 Increase heparin drip to 1500 units/hr Check heparin level 8 hrs after bolus Daily CBC & heparin level F/u transition to [place], PharmD, BCPS 09/19/2021 6:27 PM

## 2021-09-19 NOTE — Hospital Course (Signed)
76 y.o. 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   Unable to provide full history from patient while he is on BiPAP.  Reports increasing shortness of breath with cough for the past 2 days.   Last admitted from 5/1-09/10/21 with acute metabolic encephalopathy secondary to UTI and had physical deconditioning with weakness and falls and was discharged to SNF for rehab.    In the ED, he had temperature of 99.9 F, was tachycardic and was hypoxic down to 80% initially placed on nonrebreather and ultimately transitioned to BiPAP.  No leukocytosis, slight anemia with hemoglobin of 12.3.  Lactate of 2. CMP with CBG of 210 and elevated AST of 80, ALT of 46, alkaline phosphatase of 138. Troponin mildly elevated at 27. Negative flu and COVID PCR. CTA chest showing small segmental left upper lobe pulmonary emboli with minimal clot burden and no right heart strain.  There is bilateral interstitial and groundglass opacity favoring multifocal pneumonia.

## 2021-09-19 NOTE — Assessment & Plan Note (Addendum)
Stage II buttocks ulcer - WOC consulted this visit

## 2021-09-19 NOTE — Progress Notes (Signed)
During the patient's bath, this nurse observed scant blood inside the patient's purewick. The purewick was removed and the patient's peri area was assessed. The MASD on the patient's penis worsened and this nurse cleansed the patient's peri area, applied a thick layer of moisture barrier to the effected area, and a new male purewick was applied. Dr. Otilio Miu was notified and this nurse will continue to observe the patient for any further bleeding/breakdown impairment.

## 2021-09-19 NOTE — Assessment & Plan Note (Addendum)
-  small PE noted on imaging -LE dopplers pos for acute DVT in R peroneal veins -Patient was continued on therapeutic anticoagulation -Patient later transitioned to comfort care

## 2021-09-19 NOTE — Progress Notes (Signed)
This nurse evaluated the patient's swallowing with two sips of water. The patient was unable to swallow the water but held the water in his mouth and began to desaturate into the 70's. BiPAP was reapploed. The second attempt at a swallow evaluation resulted in the patient swallowing but aspirating on the water swallowed (heard gurgling, choking, patient began to cough). The patient was encouraged to cough and BiPAP was reapplied. The provider was notified and PO qHS medications were switched to IV administration.

## 2021-09-19 NOTE — Assessment & Plan Note (Addendum)
Recent hemoglobin A1c of 10.6 on 5/4 -Continued on sensitive SSI -Patient later transitioned to comfort care

## 2021-09-19 NOTE — Assessment & Plan Note (Addendum)
S/p whole brain radiation on chemotherapy -Followed by Dr. Lorenso Courier with oncology -Last chemo cycle 13 on 4/20 -Was continued on BID decadron and BID Keppra

## 2021-09-19 NOTE — TOC Initial Note (Signed)
Transition of Care (TOC) - Initial/Assessment Note    Patient Details  Name: Benjamin Willis. MRN: [id number] Date of Birth: 1945-09-30  Transition of Care [facility]) CM/SW Contact:    Leeroy Cha, RN Phone Number: 09/19/2021, 7:20 AM  Clinical Narrative:                 Patient with resp. Failure admitted from [place].  Will follow for toc needs and return to ghc.  Expected Discharge Plan: Skilled Nursing Facility Barriers to Discharge: Continued Medical Work up   Patient Goals and CMS Choice Patient states their goals for this hospitalization and ongoing recovery are:: on bipap unable to state      Expected Discharge Plan and Services Expected Discharge Plan: [place]   Discharge Planning Services: CM Consult   Living arrangements for the past 2 months: Single Family Home                                      Prior Living Arrangements/Services Living arrangements for the past 2 months: Single Family Home Lives with:: Self Patient language and need for interpreter reviewed:: Yes Do you feel safe going back to the place where you live?: Yes            Criminal Activity/Legal Involvement Pertinent to Current Situation/Hospitalization: No - Comment as needed  Activities of Daily Living Home Assistive Devices/Equipment: Other (Comment) (pt unable to verbalize what equipment he uses at this time) ADL Screening (condition at time of admission) Patient's cognitive ability adequate to safely complete daily activities?: No Is the patient deaf or have difficulty hearing?: No Does the patient have difficulty seeing, even when wearing glasses/contacts?: No Does the patient have difficulty concentrating, remembering, or making decisions?: Yes Patient able to express need for assistance with ADLs?: No (pt is very sob) Does the patient have difficulty dressing or bathing?: Yes Independently performs ADLs?: No Communication: Needs  assistance Is this a change from baseline?: Change from baseline, expected to last >3 days Dressing (OT): Needs assistance Grooming: Needs assistance Feeding: Needs assistance Bathing: Needs assistance Toileting: Needs assistance In/Out Bed: Needs assistance Walks in Home: Needs assistance Does the patient have difficulty walking or climbing stairs?: Yes Weakness of Legs: Both Weakness of Arms/Hands: Both  Permission Sought/Granted                  Emotional Assessment Appearance:: Appears stated age     Orientation: : Oriented to Self, Oriented to Place, Oriented to  Time, Oriented to Situation Alcohol / Substance Use: Not Applicable Psych Involvement: No (comment)  Admission diagnosis:  Acute respiratory failure with hypoxia ([place]) [J96.01] Community acquired pneumonia, unspecified laterality [J18.9] Sepsis, due to unspecified organism, unspecified whether acute organ dysfunction present [facility]) [A41.9] Single subsegmental pulmonary embolism without acute cor pulmonale ([place]) [I26.93] Patient Active Problem List   Diagnosis Date Noted   Pressure injury of skin 09/19/2021   Abnormal LFTs 09/19/2021   Acute respiratory failure with hypoxia ([place]) 09/18/2021   Acute pulmonary embolism ([place]) 09/18/2021   UTI (urinary tract infection) 09/02/2021   Uncontrolled type 2 diabetes mellitus with hyperglycemia ([place]) 08/20/2021   Weakness 08/20/2021   At risk for falls 08/20/2021   Shingles rash 07/07/2021   Cognitive impairment 07/04/2021   Fall at home, initial encounter 07/03/2021   Left leg weakness 07/03/2021   Leukopenia with mild neutropenia 07/02/2021  Anemia associated with chemotherapy 07/02/2021   Non-small cell lung cancer metastatic to brain [facility]) 04/01/2021   Port-A-Cath in place 12/14/2020   Brain metastases    Palliative care by specialist    Metastatic lung cancer (metastasis from lung to other site) [facility]) 10/16/2020   Glaucoma 10/16/2020   Atherosclerosis of  aorta ([place]) 05/31/2020   Primary cancer of right upper lobe of lung ([place]) 02/07/2019   Lung nodule 02/02/2019   Hypertension associated with type 2 diabetes mellitus ([place]) 05/24/2018   Dyslipidemia 05/24/2018   Dyslipidemia associated with type 2 diabetes mellitus ([place]) 06/09/2017   Goals of care, counseling/discussion 01/01/2017   PCP:  Horald Pollen, MD Pharmacy:   [facility] [address] [place]), [place] [address] [place] [address] Phone: [phone] Fax: [phone]     Social Determinants of Health ([place]) Interventions    Readmission Risk Interventions    09/04/2021    1:53 PM 07/05/2021    1:02 PM 01/10/2021    9:52 AM  Readmission Risk Prevention Plan  Transportation Screening Complete Complete Complete  PCP or Specialist Appt within 3-5 Days Complete Complete Complete  HRI or Home Care Consult Complete Complete Complete  Social Work Consult for [place] Planning/Counseling Complete Complete Complete  Palliative Care Screening Not Applicable Complete Not Applicable  Medication Review [employer]) Complete Complete Complete

## 2021-09-19 NOTE — Progress Notes (Signed)
At approximately 2304, pt transported on bipap from ED to ICU 1222.  Pt tolerated transport well without incident.

## 2021-09-19 NOTE — Consult Note (Signed)
[place] Nurse Consult Note: Reason for Consult:Stage 3 pressure injury. [name] is in room visiting and reports that patient site in recliner chair all day "watching [employer]" Wound type:Pressure and/or shear Pressure Injury POA: Yes Measurement:4cm x 3cm x 0.1cm Wound bed:Red, moist. Drainage (amount, consistency, odor): scant serous  Periwound: Intact, dry Dressing procedure/placement/frequency: Turning and repositioning is in place, topical care guidance is provided using a xeroform gauze to cover lesion topped with silicone foam. Heel boots are provided as well as a pressure redistribution chair cushion for use not only here when OOB, but also at home.  While in the ICU, the mattress has a low air loss feature, once upstairs and stabilized, turning and repositioning should be sufficient while in bed.   [place] nursing team will not follow, but will remain available to this patient, the nursing and medical teams.  Please re-consult if needed. Thanks, Maudie Flakes, MSN, RN, [place], Arther Abbott  Pager# [phone]

## 2021-09-19 NOTE — Progress Notes (Signed)
RT took pt off and placed on 4 LPM [place] had to increase to 12 LPM HF. Pt had to be placed back on BIPAP due to low sats.

## 2021-09-19 NOTE — Assessment & Plan Note (Addendum)
elevated AST of 80, ALT of 46, alkaline phosphatase of 138 at time of presentation -LFT's have remained stable

## 2021-09-19 NOTE — Progress Notes (Signed)
  Progress Note   Patient: Benjamin Willis. ZOX:[id number] DOB: 06/24/1945 DOA: 09/18/2021     1 DOS: the patient was seen and examined on 09/19/2021   Brief hospital course: 76 y.o. 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   Unable to provide full history from patient while he is on BiPAP.  Reports increasing shortness of breath with cough for the past 2 days.   Last admitted from 5/1-09/10/21 with acute metabolic encephalopathy secondary to UTI and had physical deconditioning with weakness and falls and was discharged to SNF for rehab.    In the ED, he had temperature of 99.9 F, was tachycardic and was hypoxic down to 80% initially placed on nonrebreather and ultimately transitioned to BiPAP.  No leukocytosis, slight anemia with hemoglobin of 12.3.  Lactate of 2. CMP with CBG of 210 and elevated AST of 80, ALT of 46, alkaline phosphatase of 138. Troponin mildly elevated at 27. Negative flu and COVID PCR. CTA chest showing small segmental left upper lobe pulmonary emboli with minimal clot burden and no right heart strain.  There is bilateral interstitial and groundglass opacity favoring multifocal pneumonia.   Assessment and Plan: * Acute respiratory failure with hypoxia (HCC) Presented with hypoxia down to 80% requiring BiPAP secondary to pulmonary embolism and multifocal pneumonia - CTA chest showing small subsegmental left upper lobe pulmonary embolism with minimal clot.  No heart strain.  Bilateral interstitial groundglass opacity favoring multifocal pneumonia -Continued IV vancomycin and cefepime -continued on heparin gtt -Continued on BiPAP and wean as tolerated. This AM, failed attempt at weaning to [facility] -Will f/u on 2d echo  Non-small cell lung cancer metastatic to brain [facility]) S/p whole brain radiation on chemotherapy -Followed by Dr. Lorenso Courier with oncology -Last chemo cycle  13 on 4/20 -Continue BID decadron -continue BID Keppra  Abnormal LFTs elevated AST of 80, ALT of 46, alkaline phosphatase of 138  -LFT's trending down -Repeat CMP in AM   Pressure injury of skin Stage II buttocks ulcer - WOC consulted with recs noted  Acute pulmonary embolism ([place]) -  Uncontrolled type 2 diabetes mellitus with hyperglycemia ([place]) Recent hemoglobin A1c of 10.6 on 5/4 -Continued on sensitive SSI -glycemic trends stable        Subjective: States breathing better this AM  Physical Exam: Vitals:   09/19/21 1300 09/19/21 1400 09/19/21 1500 09/19/21 1600  BP: 126/72 (!) 126/92 (!) 146/91 121/82  Pulse:      Resp: 19 16 20 17   Temp:    98.4 F (36.9 C)  TempSrc:    Axillary  SpO2:      Weight:       General exam: Awake, laying in bed, in nad Respiratory system: Increased respiratory effort, no wheezing, bipap in place Cardiovascular system: tachycardic, s1, s2 Gastrointestinal system: Soft, nondistended, positive BS Central nervous system: CN2-12 grossly intact, strength intact Extremities: Perfused, no clubbing Skin: Normal skin turgor, no notable skin lesions seen Psychiatry: Mood normal // no visual hallucinations   Data Reviewed:  Labs reviewed: Cr 0.61, Hgb 10.4  Family Communication: Pt in room, family not at bedside  Disposition: Status is: Inpatient Remains inpatient appropriate because: Severity of illness  Planned Discharge Destination: Skilled nursing facility     Author: Marylu Lund, MD 09/19/2021 6:38 PM  For on call review [email or web address].

## 2021-09-19 NOTE — Assessment & Plan Note (Addendum)
Presented with hypoxia down to 80% requiring BiPAP secondary to pulmonary embolism and multifocal pneumonia - CTA chest showing small subsegmental left upper lobe pulmonary embolism with minimal clot.  No heart strain.  Bilateral interstitial groundglass opacity favoring multifocal pneumonia -Noted to have difficulty weaning off bipap -Serial CXR with findings suggesting worsening infiltrates -Patient was continued on cefepime with added azithromycin -2d echo reviewed, no evidence of heart strain. Normal LVEF -Despite broad spectrum abx and anticoagulation, patient's respiratory status did not improve and patient failed to wean from BiPAP -Appreciate assistance by Palliative Care. Code Status later confirmed to be DNR/DNI. Patient was later transitioned to comfort care

## 2021-09-19 NOTE — Progress Notes (Signed)
Inpatient Diabetes Program Recommendations  AACE/ADA: New Consensus Statement on Inpatient Glycemic Control (2015)  Target Ranges:  Prepandial:   less than 140 mg/dL      Peak postprandial:   less than 180 mg/dL (1-2 hours)      Critically ill patients:  140 - 180 mg/dL   Lab Results  Component Value Date   GLUCAP 124 (H) 09/19/2021   HGBA1C 10.6 (H) 09/05/2021    Review of Glycemic Control  Latest Reference Range & Units 09/18/21 15:42 09/19/21 00:20 09/19/21 07:31 09/19/21 11:42  Glucose-Capillary 70 - 99 mg/dL 216 (H) 184 (H) 148 (H) 124 (H)   Diabetes history: DM 2 Outpatient Diabetes medications:  Decadron 4 mg bid Jardiance 10 mg daily with breakfast Metformin 500 mg bid Mounjaro 5 mg q Mondays Current orders for Inpatient glycemic control:  Novolog 0-9 units tid with meals Decadron 4 mg bid Inpatient Diabetes Program Recommendations:    Blood sugars currently controlled.  Will follow.   Thanks,  Adah Perl, RN, BC-ADM Inpatient Diabetes Coordinator Pager [phone]  (8a-5p)

## 2021-09-20 ENCOUNTER — Other Ambulatory Visit: Payer: Medicare HMO

## 2021-09-20 ENCOUNTER — Ambulatory Visit: Payer: Medicare HMO

## 2021-09-20 ENCOUNTER — Inpatient Hospital Stay (HOSPITAL_COMMUNITY): Payer: Medicare HMO

## 2021-09-20 ENCOUNTER — Ambulatory Visit: Payer: Medicare HMO | Admitting: Hematology and Oncology

## 2021-09-20 DIAGNOSIS — E870 Hyperosmolality and hypernatremia: Secondary | ICD-10-CM | POA: Diagnosis not present

## 2021-09-20 DIAGNOSIS — J189 Pneumonia, unspecified organism: Secondary | ICD-10-CM | POA: Diagnosis not present

## 2021-09-20 DIAGNOSIS — I2602 Saddle embolus of pulmonary artery with acute cor pulmonale: Secondary | ICD-10-CM | POA: Diagnosis not present

## 2021-09-20 DIAGNOSIS — J9601 Acute respiratory failure with hypoxia: Secondary | ICD-10-CM | POA: Diagnosis not present

## 2021-09-20 LAB — COMPREHENSIVE METABOLIC PANEL
ALT: 35 U/L (ref 0–44)
AST: 100 U/L — ABNORMAL HIGH (ref 15–41)
Albumin: 2 g/dL — ABNORMAL LOW (ref 3.5–5.0)
Alkaline Phosphatase: 151 U/L — ABNORMAL HIGH (ref 38–126)
Anion gap: 11 (ref 5–15)
BUN: 15 mg/dL (ref 8–23)
CO2: 24 mmol/L (ref 22–32)
Calcium: 9 mg/dL (ref 8.9–10.3)
Chloride: 111 mmol/L (ref 98–111)
Creatinine, Ser: 0.74 mg/dL (ref 0.61–1.24)
GFR, Estimated: >60 mL/min (ref >60)
Glucose, Bld: 124 mg/dL — ABNORMAL HIGH (ref 70–99)
Potassium: 2.9 mmol/L — ABNORMAL LOW (ref 3.5–5.1)
Sodium: 146 mmol/L — ABNORMAL HIGH (ref 135–145)
Total Bilirubin: 1.1 mg/dL (ref 0.3–1.2)
Total Protein: 5.9 g/dL — ABNORMAL LOW (ref 6.5–8.1)

## 2021-09-20 LAB — CBC
HCT: 31.6 % — ABNORMAL LOW (ref 39.0–52.0)
Hemoglobin: 10.2 g/dL — ABNORMAL LOW (ref 13.0–17.0)
MCH: 32.2 pg (ref 26.0–34.0)
MCHC: 32.3 g/dL (ref 30.0–36.0)
MCV: 99.7 fL (ref 80.0–100.0)
Platelets: 171 10*3/uL (ref 150–400)
RBC: 3.17 MIL/uL — ABNORMAL LOW (ref 4.22–5.81)
RDW: 15.1 % (ref 11.5–15.5)
WBC: 7.3 10*3/uL (ref 4.0–10.5)
nRBC: 5.1 % — ABNORMAL HIGH (ref 0.0–0.2)

## 2021-09-20 LAB — GLUCOSE, CAPILLARY
Glucose-Capillary: 135 mg/dL — ABNORMAL HIGH (ref 70–99)
Glucose-Capillary: 170 mg/dL — ABNORMAL HIGH (ref 70–99)
Glucose-Capillary: 148 mg/dL — ABNORMAL HIGH (ref 70–99)
Glucose-Capillary: 175 mg/dL — ABNORMAL HIGH (ref 70–99)

## 2021-09-20 LAB — ECHOCARDIOGRAM COMPLETE
Area-P 1/2: 4.39 cm2
Calc EF: 53.4 %
S' Lateral: 3.7 cm
Single Plane A2C EF: 57 %
Single Plane A4C EF: 49.8 %
Weight: 2222.24 oz

## 2021-09-20 LAB — HEPARIN LEVEL (UNFRACTIONATED)
Heparin Unfractionated: 0.98 IU/mL — ABNORMAL HIGH (ref 0.30–0.70)
Heparin Unfractionated: >1.1 IU/mL — ABNORMAL HIGH (ref 0.30–0.70)

## 2021-09-20 LAB — LEGIONELLA PNEUMOPHILA SEROGP 1 UR AG: L. pneumophila Serogp 1 Ur Ag: NEGATIVE

## 2021-09-20 IMAGING — DX DG CHEST 1V PORT
1 series · 1 of 1 positions shown · non-contrast
Comparison: [DATE]

CLINICAL DATA: Hypoxia. History of non-small cell lung cancer with
brain metastases.

EXAM:
PORTABLE CHEST 1 VIEW

[chest ap]
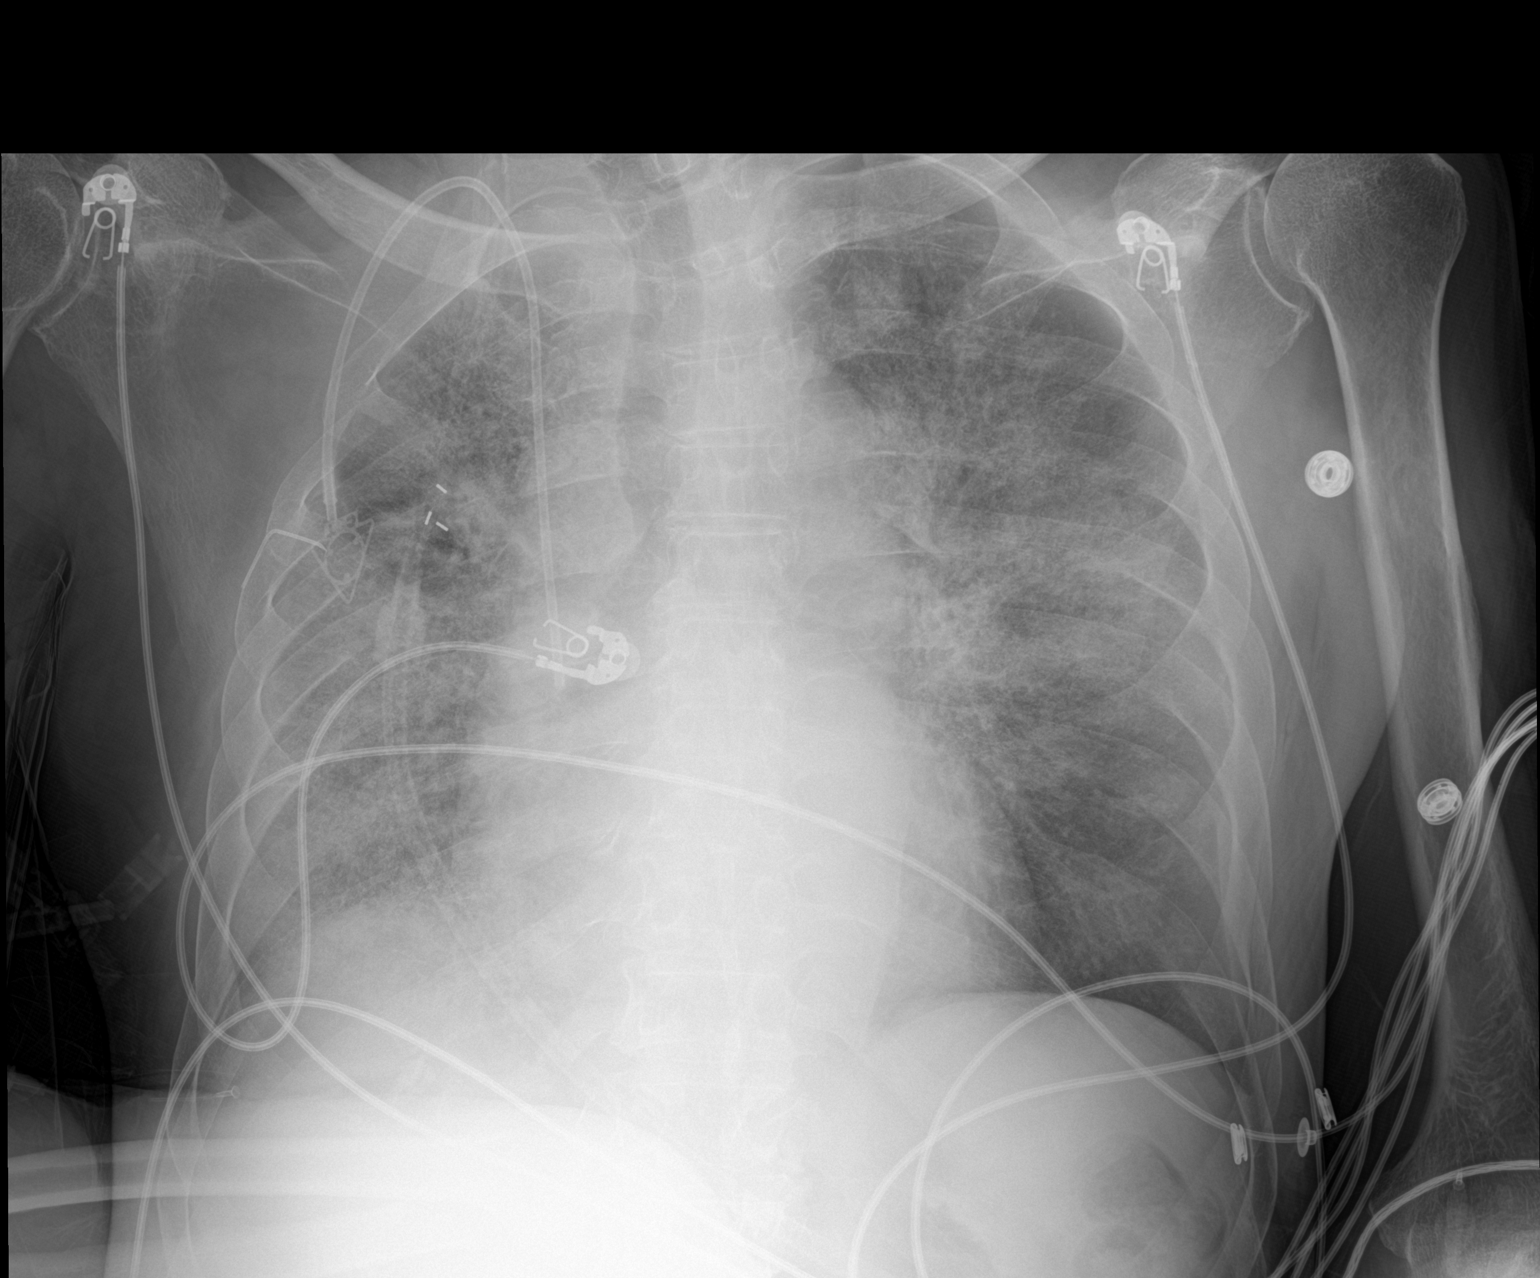

[1 of 1 positions shown; findings below may reference images not displayed]

FINDINGS: Right IJ Port-A-Cath unchanged. Patient is rotated to the right.
Lungs are adequately inflated demonstrate slight interval worsening
of moderate bilateral diffuse hazy airspace opacification most
prominent over the perihilar regions as findings may be due to
interstitial edema versus infection and less likely lymphangitic
spread of patient's known lung cancer. No definite effusion.
Cardiomediastinal silhouette and remainder of the exam is unchanged.
IMPRESSION: Slight interval worsening of moderate bilateral diffuse hazy
airspace process which may be due to interstitial edema versus
infection and less likely lymphangitic spread of patient's known
lung cancer.

## 2021-09-20 MED ORDER — SODIUM CHLORIDE 0.9 % IV SOLN
500.0000 mg | INTRAVENOUS | Status: AC
  Administered 2021-09-20 – 2021-09-23 (×4): 500 mg via INTRAVENOUS
  Filled 2021-09-20 (×4): qty 5

## 2021-09-20 MED ORDER — HEPARIN (PORCINE) 25000 UT/250ML-% IV SOLN: 1300.0000 [IU]/h | INTRAVENOUS | Status: DC

## 2021-09-20 MED ORDER — POTASSIUM CHLORIDE 10 MEQ/100ML IV SOLN
10.0000 meq | INTRAVENOUS | Status: AC
  Administered 2021-09-20 (×6): 10 meq via INTRAVENOUS
  Filled 2021-09-20 (×6): qty 100

## 2021-09-20 MED ORDER — ENOXAPARIN SODIUM 60 MG/0.6ML IJ SOSY
60.0000 mg | PREFILLED_SYRINGE | Freq: Two times a day (BID) | INTRAMUSCULAR | Status: DC
Start: 2021-09-20 — End: 2021-09-24
  Administered 2021-09-20 – 2021-09-24 (×8): 60 mg via SUBCUTANEOUS
  Filled 2021-09-20 (×8): qty 0.6

## 2021-09-20 NOTE — Evaluation (Signed)
Clinical/Bedside Swallow Evaluation Patient Details  Name: Benjamin Willis. MRN: [id number] Date of Birth: Sep 21, 1945  Today's Date: 09/20/2021 Time: SLP Start Time (ACUTE ONLY): 1100 SLP Stop Time (ACUTE ONLY): 1115 SLP Time Calculation (min) (ACUTE ONLY): 15 min  Past Medical History:  Past Medical History:  Diagnosis Date   Allergy    Diabetes mellitus without complication ([place])    Type II   Glaucoma 10/16/2020   History of radiation therapy    [place]- SBRT 02/21/19-02/28/19, Rt Lung- IMRT 02/29/20-04/16/20 Dr. Gery Pray   History of radiation therapy 10/30/2020   whole brain 10/17/2020-10/30/2020 Dr Gery Pray   Hyperlipidemia    Hypertension    Lung cancer ([place])    Stage IA2 (cT1b, N0) non-small cell lung cancer (adenocarcinoma)   Past Surgical History:  Past Surgical History:  Procedure Laterality Date   ELECTROMAGNETIC NAVIGATION BROCHOSCOPY Right 02/02/2019   Procedure: VIDEO BRONCHOSCOPY WITH NAVIGATION AND FIDUCIAL PLACEMENT;  Surgeon: Garner Nash, DO;  Location: [place];  Service: Cardiopulmonary;  Laterality: Right;   FUDUCIAL PLACEMENT Right 02/02/2019   Procedure: Placement Of Fuducial Right Upper Lobe;  Surgeon: Garner Nash, DO;  Location: [facility];  Service: Cardiopulmonary;  Laterality: Right;   IR IMAGING GUIDED PORT INSERTION  12/13/2020   none     VIDEO BRONCHOSCOPY WITH RADIAL ENDOBRONCHIAL ULTRASOUND Right 02/02/2019   Procedure: Video Bronchoscopy With Radial Endobronchial Ultrasound;  Surgeon: Garner Nash, DO;  Location: [place];  Service: Cardiopulmonary;  Laterality: Right;   HPI:  76 y.o. 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 Has been on BiPAP 5/17-5/19.  CTA chest showing small segmental left upper lobe pulmonary emboli with minimal clot burden and no right heart strain.  There is bilateral interstitial and groundglass opacity  favoring multifocal pneumonia. Last admitted from 5/1-09/10/21 with acute metabolic encephalopathy secondary to UTI and had physical deconditioning with weakness and falls and was discharged to SNF for rehab. Pt evaluated on 09/07/21 initially showing confusion and lethargy with difficulty drinking, but over 24 resumed baseline function of self feeding regular textrues with no signs of aspiration.    Assessment / Plan / Recommendation  Clinical Impression  Pt demonstrates tolerance of thin liquids taken consecutively 3 oz at a time over three trials with no signs of aspiration. Pt did require repositioning, oral care and ice chips given prolonged period on BiPAP until this am. WIth verbal cueing pt progressed from ice eliciting one cough to sips and then puree and mastication of a cracker with his gums. Pt reports he does not use dentures, but this may not be reliable. For now, recommend thin liquids when alert and upright and mechanical soft solids. WIll f/u x one for tolerance given generalized weakness in ICU and no dentition. SLP Visit Diagnosis: Dysphagia, unspecified (R13.10)    Aspiration Risk  Mild aspiration risk    Diet Recommendation Dysphagia 3 (Mech soft);Thin liquid   Liquid Administration via: Cup;Straw Medication Administration: Whole meds with liquid Supervision: Patient able to self feed;Staff to assist with self feeding Compensations: Small sips/bites Postural Changes: Seated upright at 90 degrees    Other  Recommendations Oral Care Recommendations: Oral care BID    Recommendations for follow up therapy are one component of a multi-disciplinary discharge planning process, led by the attending physician.  Recommendations may be updated based on patient status, additional functional criteria and insurance authorization.  Follow up  Recommendations No SLP follow up      Assistance Recommended at Discharge    Functional Status Assessment Patient has had a recent decline in their  functional status and demonstrates the ability to make significant improvements in function in a reasonable and predictable amount of time.  Frequency and Duration min 1 x/week  1 week       Prognosis        Swallow Study   General HPI: 76 y.o. 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 Has been on BiPAP 5/17-5/19.  CTA chest showing small segmental left upper lobe pulmonary emboli with minimal clot burden and no right heart strain.  There is bilateral interstitial and groundglass opacity favoring multifocal pneumonia. Last admitted from 5/1-09/10/21 with acute metabolic encephalopathy secondary to UTI and had physical deconditioning with weakness and falls and was discharged to SNF for rehab. Pt evaluated on 09/07/21 initially showing confusion and lethargy with difficulty drinking, but over 24 resumed baseline function of self feeding regular textrues with no signs of aspiration. Type of Study: Bedside Swallow Evaluation Previous Swallow Assessment: see HPI Diet Prior to this Study: NPO Temperature Spikes Noted: No Respiratory Status: Nasal cannula History of Recent Intubation: No Behavior/Cognition: Alert;Requires cueing Oral Cavity Assessment: Dry Oral Care Completed by SLP: Yes Oral Cavity - Dentition: Edentulous Vision: Functional for self-feeding Self-Feeding Abilities: Able to feed self Patient Positioning: Upright in bed Baseline Vocal Quality: Normal Volitional Cough: Strong Volitional Swallow: Able to elicit    Oral/Motor/Sensory Function Overall Oral Motor/Sensory Function: Within functional limits   Ice Chips Ice chips: Impaired Presentation: Spoon Pharyngeal Phase Impairments: Cough - Immediate   Thin Liquid Thin Liquid: Within functional limits    Nectar Thick Nectar Thick Liquid: Not tested   Honey Thick Honey Thick Liquid: Not tested   Puree Puree: Within  functional limits   Solid     Solid: Within functional limits      Benjamin Willis, Benjamin Willis 09/20/2021,11:17 AM

## 2021-09-20 NOTE — Progress Notes (Signed)
RT took pt off BIPAP and placed on 15 LPM HF. Pt has no WOB at this time.

## 2021-09-20 NOTE — Progress Notes (Signed)
ANTICOAGULATION CONSULT NOTE  Pharmacy Consult for Heparin Indication: pulmonary embolus  No Known Allergies  Patient Measurements: Weight: 63 kg (138 lb 14.2 oz) Height: 66 inches Heparin Dosing Weight: 62.2 kg on 08/30/21  Vital Signs: Temp: 98.8 F (37.1 C) (05/19 0400) Temp Source: Axillary (05/19 0400) BP: 115/83 (05/19 0400) Pulse Rate: 87 (05/19 0300)  Labs: Recent Labs    09/18/21 1600 09/18/21 1611 09/18/21 2350 09/19/21 0153 09/19/21 0601 09/19/21 1720 09/20/21 0421  HGB 12.3*  --   --   --   --  10.4* 10.2*  HCT 38.1*  --   --   --   --  31.3* 31.6*  PLT 163  --   --   --   --  171 171  APTT 25  --   --   --   --   --   --   LABPROT 14.2  --   --   --   --   --   --   INR 1.1  --   --   --   --   --   --   HEPARINUNFRC  --   --   --   --  0.14* 0.11* 0.98*  CREATININE 0.81  --   --  0.61  --   --  0.74  TROPONINIHS  --  27* 29*  --   --   --   --      Estimated Creatinine Clearance: 72.2 mL/min (by C-G formula based on SCr of 0.74 mg/dL).  Assessment: 76 yo male with metastatic lung cancer presents with difficulty breathing.  CTa positive for PE.  Pharmacy consulted to dose IV heparin.  No anticoagulants noted PTA.  09/20/2021 HL 0.98 supra-therapeutic on 1500 units/hr Hgb 10.2, Plts 171 Per RN scant blood in peri area but no other bleeding noted   Goal of Therapy:  Heparin level 0.3-0.7 units/ml Monitor platelets by anticoagulation protocol: Yes   Plan:  Hold heparin drip x 1 hour Decrease heparin drip to 1300 units/hr Check heparin level 8 hrs  Daily CBC & heparin level F/u transition to [place] 09/20/2021, 5:08 AM

## 2021-09-20 NOTE — Progress Notes (Signed)
  Echocardiogram 2D Echocardiogram has been performed.  Benjamin Willis 09/20/2021, 1:48 PM

## 2021-09-20 NOTE — Progress Notes (Signed)
Pharmacy Brief Note - Evening Anticoagulation Follow Up:  Patient is a 39 yoM on heparin drip for a PE. For full history, see note by Kavin Leech, RPh.   Assessment: Today, 09/20/21 HL = > 1.1 is supratherapeutic on heparin infusion of 1300 units/hr Confirmed with RN that heparin infusing at correct rate. Heparin infusing via PIV, HL drawn from port. No signs of bleeding.   Goal: HL 0.3 - 0.7  Plan:  Discussed anticoagulation with MD. Will transition patient to therapeutic LMWH 1 mg/kg (60 mg) subQ q12h.  Since HL is high, will start enoxaparin 1 hour after heparin stopped. Recheck CBC and SCr with AM labs tomorrow.   Pharmacy to sign off.   Lenis Noon, PharmD 09/20/21 3:21 PM

## 2021-09-20 NOTE — Assessment & Plan Note (Addendum)
-  clinically appears dry on exam -Sodium now normalized with D5 fluids

## 2021-09-20 NOTE — Progress Notes (Signed)
Progress Note   Patient: Benjamin Willis. NGE:[id number] DOB: [birth year] DOA: 09/18/2021     2 DOS: the patient was seen and examined on 09/20/2021   Brief hospital course: 76 y.o. 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   Unable to provide full history from patient while he is on BiPAP.  Reports increasing shortness of breath with cough for the past 2 days.   Last admitted from 5/1-09/10/21 with acute metabolic encephalopathy secondary to UTI and had physical deconditioning with weakness and falls and was discharged to SNF for rehab.    In the ED, he had temperature of 99.9 F, was tachycardic and was hypoxic down to 80% initially placed on nonrebreather and ultimately transitioned to BiPAP.  No leukocytosis, slight anemia with hemoglobin of 12.3.  Lactate of 2. CMP with CBG of 210 and elevated AST of 80, ALT of 46, alkaline phosphatase of 138. Troponin mildly elevated at 27. Negative flu and COVID PCR. CTA chest showing small segmental left upper lobe pulmonary emboli with minimal clot burden and no right heart strain.  There is bilateral interstitial and groundglass opacity favoring multifocal pneumonia.   Assessment and Plan: * Acute respiratory failure with hypoxia (HCC) Presented with hypoxia down to 80% requiring BiPAP secondary to pulmonary embolism and multifocal pneumonia - CTA chest showing small subsegmental left upper lobe pulmonary embolism with minimal clot.  No heart strain.  Bilateral interstitial groundglass opacity favoring multifocal pneumonia -Now weaned to 15L high flow [place] -Ordered and reviewed repeat CXR, findings of worsening opacity -Currently on cefepime, have added azithromycin -2d echo reviewed, no evidence of heart strain. Normal LVEF  Non-small cell lung cancer metastatic to brain [facility]) S/p whole brain radiation on chemotherapy -Followed by Dr. Lorenso Courier  with oncology -Last chemo cycle 13 on 4/20 -Continue BID decadron -continue BID Keppra  Hypernatremia -clinically appears dry on exam -start basal IVF, repeat bmet in AM  Abnormal LFTs elevated AST of 80, ALT of 46, alkaline phosphatase of 138  -LFT's stable at this time  Pressure injury of skin Stage II buttocks ulcer - WOC consulted with recs noted  Acute pulmonary embolism ([place]) -  Uncontrolled type 2 diabetes mellitus with hyperglycemia ([place]) Recent hemoglobin A1c of 10.6 on 5/4 -Continued on sensitive SSI -glycemic trends stable        Subjective: Now off bipap, reports breathing better this AM  Physical Exam: Vitals:   09/20/21 1500 09/20/21 1600 09/20/21 1700 09/20/21 1800  BP: (!) 141/74 111/68 (!) 131/106 140/70  Pulse:      Resp: 19 16 (!) 25 (!) 21  Temp:  (!) 96.5 F (35.8 C)    TempSrc:  Axillary    SpO2:      Weight:       General exam: Conversant, in no acute distress Respiratory system: normal chest rise, clear, no audible wheezing Cardiovascular system: regular rhythm, s1-s2 Gastrointestinal system: Nondistended, nontender, pos BS Central nervous system: No seizures, no tremors Extremities: No cyanosis, no joint deformities Skin: No rashes, no pallor Psychiatry: Affect normal // no auditory hallucinations   Data Reviewed:  Labs reviewed: Cr 0.74, Hgb 10.3  Family Communication: Pt in room, family not at bedside  Disposition: Status is: Inpatient Remains inpatient appropriate because: Severity of illness  Planned Discharge Destination: Skilled nursing facility     Author: Marylu Lund, MD 09/20/2021 6:56 PM  For on call review [email or web address].

## 2021-09-21 ENCOUNTER — Inpatient Hospital Stay (HOSPITAL_COMMUNITY): Payer: Medicare HMO

## 2021-09-21 DIAGNOSIS — J189 Pneumonia, unspecified organism: Secondary | ICD-10-CM | POA: Diagnosis not present

## 2021-09-21 DIAGNOSIS — J9601 Acute respiratory failure with hypoxia: Secondary | ICD-10-CM | POA: Diagnosis not present

## 2021-09-21 DIAGNOSIS — E870 Hyperosmolality and hypernatremia: Secondary | ICD-10-CM | POA: Diagnosis not present

## 2021-09-21 DIAGNOSIS — I82451 Acute embolism and thrombosis of right peroneal vein: Secondary | ICD-10-CM

## 2021-09-21 LAB — GLUCOSE, CAPILLARY
Glucose-Capillary: 165 mg/dL — ABNORMAL HIGH (ref 70–99)
Glucose-Capillary: 153 mg/dL — ABNORMAL HIGH (ref 70–99)
Glucose-Capillary: 138 mg/dL — ABNORMAL HIGH (ref 70–99)
Glucose-Capillary: 148 mg/dL — ABNORMAL HIGH (ref 70–99)
Glucose-Capillary: 138 mg/dL — ABNORMAL HIGH (ref 70–99)

## 2021-09-21 LAB — CBC
HCT: 30.9 % — ABNORMAL LOW (ref 39.0–52.0)
Hemoglobin: 10.2 g/dL — ABNORMAL LOW (ref 13.0–17.0)
MCH: 32.5 pg (ref 26.0–34.0)
MCHC: 33 g/dL (ref 30.0–36.0)
MCV: 98.4 fL (ref 80.0–100.0)
Platelets: 215 10*3/uL (ref 150–400)
RBC: 3.14 MIL/uL — ABNORMAL LOW (ref 4.22–5.81)
RDW: 15.5 % (ref 11.5–15.5)
WBC: 7.8 10*3/uL (ref 4.0–10.5)
nRBC: 5.7 % — ABNORMAL HIGH (ref 0.0–0.2)

## 2021-09-21 LAB — COMPREHENSIVE METABOLIC PANEL
ALT: 32 U/L (ref 0–44)
AST: 104 U/L — ABNORMAL HIGH (ref 15–41)
Albumin: 1.9 g/dL — ABNORMAL LOW (ref 3.5–5.0)
Alkaline Phosphatase: 172 U/L — ABNORMAL HIGH (ref 38–126)
Anion gap: 8 (ref 5–15)
BUN: 18 mg/dL (ref 8–23)
CO2: 25 mmol/L (ref 22–32)
Calcium: 8.9 mg/dL (ref 8.9–10.3)
Chloride: 113 mmol/L — ABNORMAL HIGH (ref 98–111)
Creatinine, Ser: 0.68 mg/dL (ref 0.61–1.24)
GFR, Estimated: >60 mL/min (ref >60)
Glucose, Bld: 168 mg/dL — ABNORMAL HIGH (ref 70–99)
Potassium: 3.3 mmol/L — ABNORMAL LOW (ref 3.5–5.1)
Sodium: 146 mmol/L — ABNORMAL HIGH (ref 135–145)
Total Bilirubin: 1 mg/dL (ref 0.3–1.2)
Total Protein: 5.8 g/dL — ABNORMAL LOW (ref 6.5–8.1)

## 2021-09-21 IMAGING — DX DG CHEST 1V PORT
1 series · 1 of 1 positions shown · non-contrast
Comparison: Previous studies including the examination of
[DATE]

CLINICAL DATA: Difficulty breathing

EXAM:
PORTABLE CHEST 1 VIEW

[chest ap]
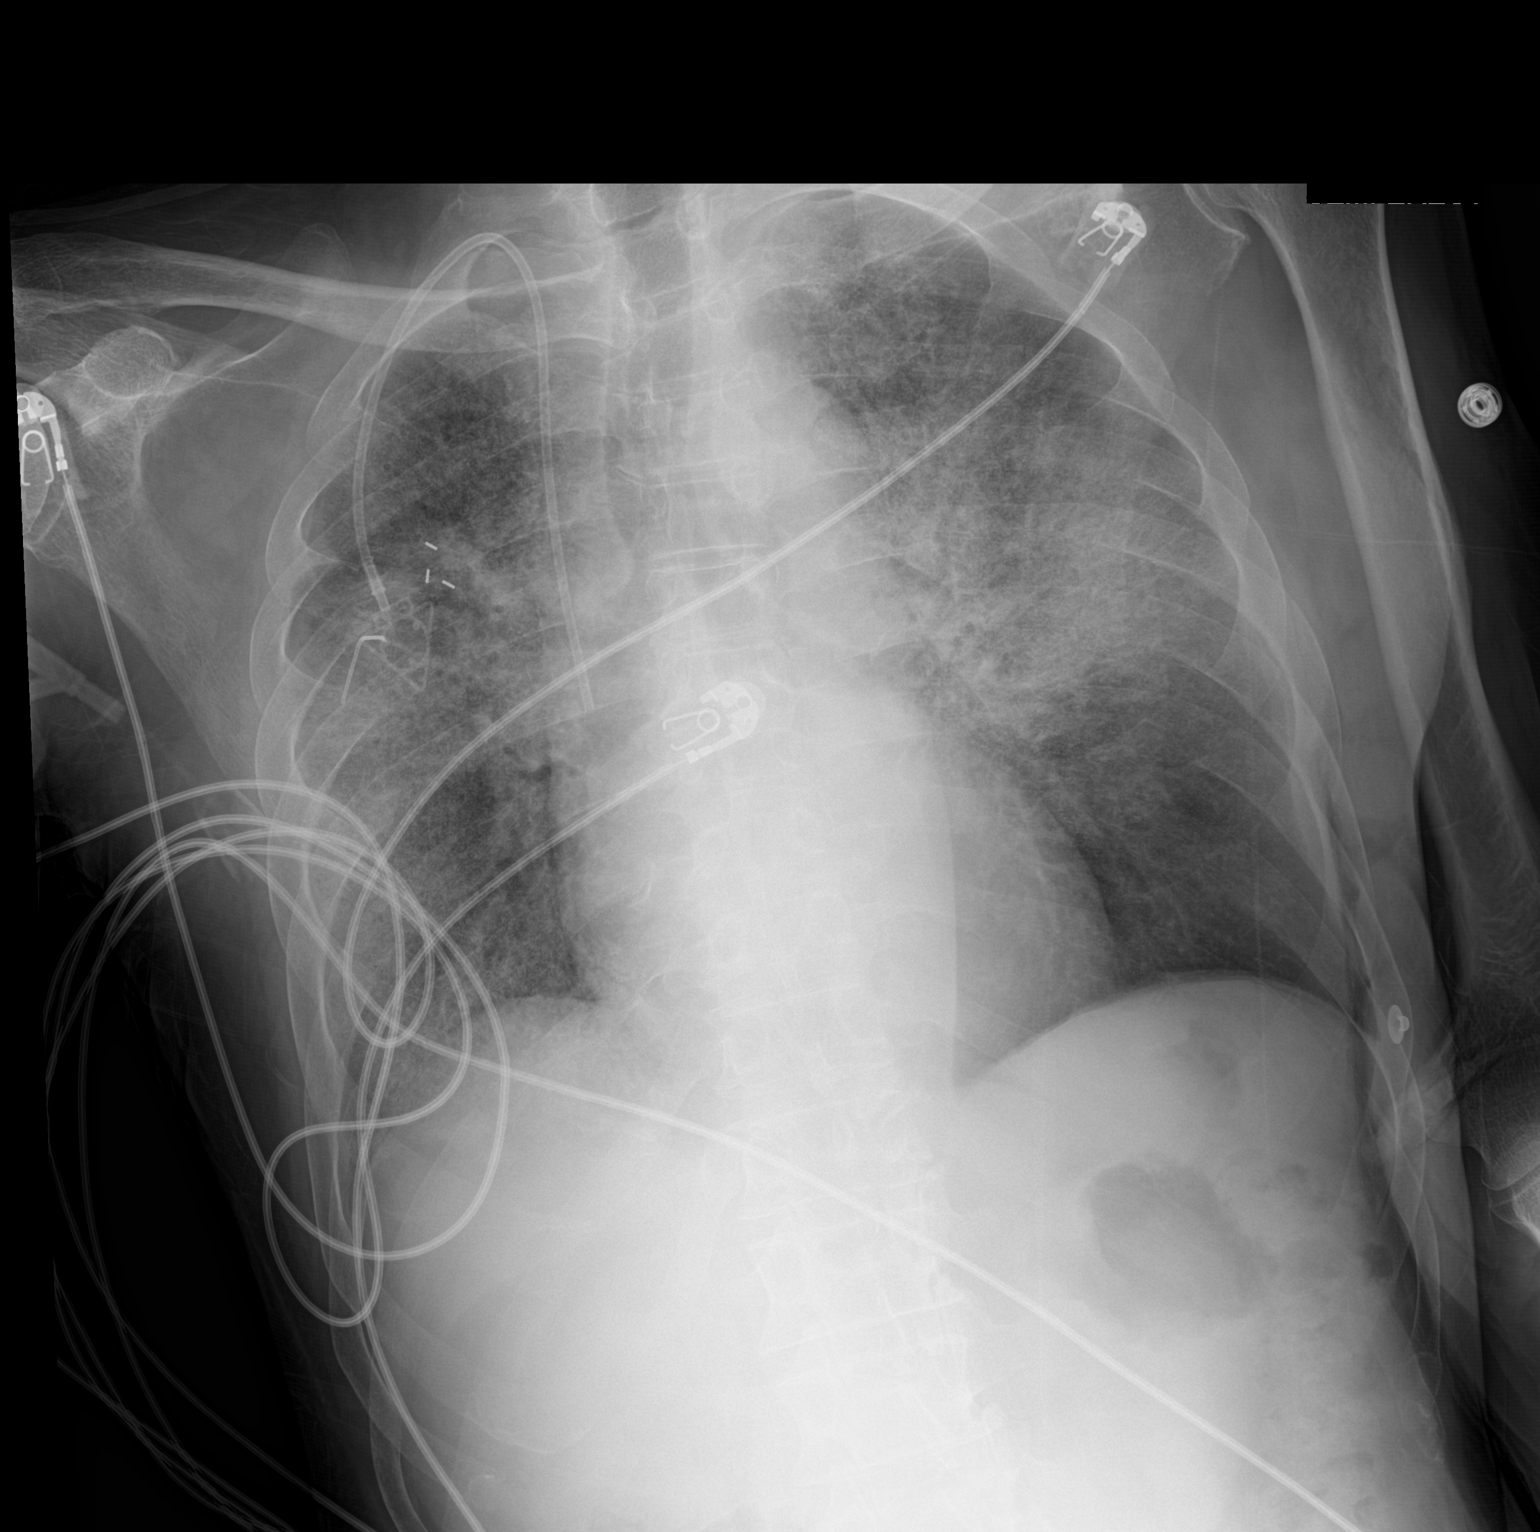

[1 of 1 positions shown; findings below may reference images not displayed]

FINDINGS: Cardiac size is within normal limits. There are patchy alveolar and
interstitial densities in the left parahilar region with interval
worsening. Increased interstitial markings are seen in the right
lung with possible slight improvement in right lower lung fields.
Right lateral CP angle is indistinct. There is no pneumothorax. Tip
of right IJ chest port is seen in the superior vena cava.
IMPRESSION: There is interval worsening aeration in the left parahilar region
and improvement in aeration in the right lower lung fields.
Increased interstitial and alveolar markings in both lungs suggest
pulmonary edema or multifocal pneumonia. Possible small right
pleural effusion.

## 2021-09-21 MED ORDER — NITROGLYCERIN 0.4 MG SL SUBL: 0.4000 mg | SUBLINGUAL_TABLET | SUBLINGUAL | Status: DC | PRN

## 2021-09-21 MED ORDER — LACTATED RINGERS IV SOLN: INTRAVENOUS | Status: DC

## 2021-09-21 MED ORDER — MORPHINE SULFATE (PF) 2 MG/ML IV SOLN
1.0000 mg | INTRAVENOUS | Status: DC | PRN
  Administered 2021-09-22 (×2): 1 mg via INTRAVENOUS
  Filled 2021-09-21 (×2): qty 1

## 2021-09-21 NOTE — Progress Notes (Signed)
Patient taken off Bipap and placed on HFNC 15L, no increased WOB at this time, sats 91-94%.  RN notified.

## 2021-09-21 NOTE — Progress Notes (Signed)
OT Cancellation Note  Patient Details Name: Benjamin Willis. MRN: [id number] DOB: [birth year]   Cancelled Treatment:    Reason Eval/Treat Not Completed: Patient not medically ready. Patient on Bipap after desatting to 70s on [place]. Palliative care has been consulted. Will follow and eval when medically appropriate and if inline with current GOC.  [name] L [name] 09/21/2021, 12:04 PM

## 2021-09-21 NOTE — Progress Notes (Signed)
Pharmacy Antibiotic Note  Benjamin Willis. is a 76 y.o. male admitted on 09/18/2021  febrile and tachycardic.  CTA chest  shows  bilateral interstitial and groundglass opacity favoring multifocal pneumonia.  Pharmacy has been consulted for cefepime dosing.    Plan: Continue cefepime 2gm IV q8h Follow renal function and clinical course  Weight: 63 kg (138 lb 14.2 oz)  Temp (24hrs), Avg:97.6 F (36.4 C), Min:96.2 F (35.7 C), Max:98.7 F (37.1 C)  Recent Labs  Lab 09/18/21 1600 09/18/21 2350 09/19/21 0153 09/19/21 1720 09/20/21 0421 09/21/21 0300  WBC 7.8  --   --  6.1 7.3 7.8  CREATININE 0.81  --  0.61  --  0.74 0.68  LATICACIDVEN 2.0* 1.8  --   --   --   --      Estimated Creatinine Clearance: 72.2 mL/min (by C-G formula based on SCr of 0.68 mg/dL).    No Known Allergies  Antimicrobials this admission: 5/15 CTX 1 gm IM x 1 @ SNF 5/17 vanc >>5/18 5/17 cefepime >> 5/17 azith x1, resumed 5/19 >> (5/22)   Dose adjustments this admission:    Microbiology results: 5/17 BCx: ngtd 5/17 Sputum:   5/17 MRSA PCR: neg  Thank you for allowing pharmacy to be a part of this patient's care.  Gretta Arab PharmD, BCPS Clinical Pharmacist [facility] main pharmacy [phone] 09/21/2021 7:39 AM

## 2021-09-21 NOTE — Progress Notes (Signed)
RN notified RT at 0911 that patient desated on the HFNC 15L, and was placed back on Bipap.  RT assessed patient and will continue to monitor.

## 2021-09-21 NOTE — Progress Notes (Signed)
PT Cancellation Note  Patient Details Name: Benjamin Willis. MRN: [id number] DOB: 06/27/1945   Cancelled Treatment:    Reason Eval/Treat Not Completed: Medical issues which prohibited therapy;Patient not medically ready (Pt attempted to wean to 15L HFNC today but unable to tolerate, placed back on BiPAP 60% FiO2. Will hold and follow up at later date/time as pt medically stable/appropriate and schedule allows.)   Verner Mould, DPT Acute Rehabilitation Services Office [phone] Pager [phone]  09/21/21 11:20 AM

## 2021-09-21 NOTE — Assessment & Plan Note (Addendum)
-  Critical illness at presentation -Difficult weaning O2 in setting of known malignancy and new PE with worsening PNA -Pt was unable to wean from BiPAP --Patient later transitioned to comfort care

## 2021-09-21 NOTE — Assessment & Plan Note (Addendum)
-  Acute R peroneal DVT on [place] -Patient was continued on therapeutic anticoagulation was  -No evidence of acute blood loss, hgb remained stable -Patient later transitioned to comfort care

## 2021-09-21 NOTE — Progress Notes (Addendum)
Progress Note   Patient: Benjamin Willis. VZD:[id number] DOB: Oct 16, 1945 DOA: 09/18/2021     3 DOS: the patient was seen and examined on 09/21/2021   Brief hospital course: 76 y.o. 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   Unable to provide full history from patient while he is on BiPAP.  Reports increasing shortness of breath with cough for the past 2 days.   Last admitted from 5/1-09/10/21 with acute metabolic encephalopathy secondary to UTI and had physical deconditioning with weakness and falls and was discharged to SNF for rehab.    In the ED, he had temperature of 99.9 F, was tachycardic and was hypoxic down to 80% initially placed on nonrebreather and ultimately transitioned to BiPAP.  No leukocytosis, slight anemia with hemoglobin of 12.3.  Lactate of 2. CMP with CBG of 210 and elevated AST of 80, ALT of 46, alkaline phosphatase of 138. Troponin mildly elevated at 27. Negative flu and COVID PCR. CTA chest showing small segmental left upper lobe pulmonary emboli with minimal clot burden and no right heart strain.  There is bilateral interstitial and groundglass opacity favoring multifocal pneumonia.   Assessment and Plan: * Acute respiratory failure with hypoxia (HCC) Presented with hypoxia down to 80% requiring BiPAP secondary to pulmonary embolism and multifocal pneumonia - CTA chest showing small subsegmental left upper lobe pulmonary embolism with minimal clot.  No heart strain.  Bilateral interstitial groundglass opacity favoring multifocal pneumonia -Difficulty weaning O2, now down to 15LNC -Serial CXR with findings suggesting worsening infiltrates -Currently on cefepime with added azithromycin -2d echo reviewed, no evidence of heart strain. Normal LVEF  Non-small cell lung cancer metastatic to brain [facility]) S/p whole brain radiation on chemotherapy -Followed by Dr.  Lorenso Courier with oncology -Last chemo cycle 13 on 4/20 -Continue BID decadron -continue BID Keppra  Acute deep vein thrombosis (DVT) of right peroneal vein (HCC) -Acute R peroneal DVT on [place] -Cont therapeutic anticoagulation per below  Hypernatremia -clinically appears dry on exam -start basal IVF, sodium remains stable -Recheck bmet in AM  Abnormal LFTs elevated AST of 80, ALT of 46, alkaline phosphatase of 138 at time of presentation -LFT's stable at this time  Pressure injury of skin Stage II buttocks ulcer - WOC consulted with recs noted  Acute pulmonary embolism ([place]) -small PE noted on imaging -LE dopplers pos for acute DVT in R peroneal veins -Cont therapeutic anticoagulation  Uncontrolled type 2 diabetes mellitus with hyperglycemia (HCC) Recent hemoglobin A1c of 10.6 on 5/4 -Continued on sensitive SSI -glycemic trends stable  Goals of care, counseling/discussion -Critical illness at presentation -Pt currently Full Code -Difficult weaning O2 in setting of known malignancy and new PE -Have consulted Palliative Care, appreciate input        Subjective: Off bipap this AM however O2 sats in the 70's. Pt denied feeling sob  Physical Exam: Vitals:   09/21/21 0847 09/21/21 0916 09/21/21 1200 09/21/21 1218  BP: 125/77   127/67  Pulse: (!) 104 100  87  Resp: (!) 25 17  19   Temp:   98.1 F (36.7 C)   TempSrc:   Axillary   SpO2: 92% 95%  96%  Weight:       General exam: Awake, laying in bed, in nad Respiratory system: Normal respiratory effort, no wheezing Cardiovascular system: regular rate, s1, s2 Gastrointestinal system: Soft, nondistended, positive BS Central nervous system: CN2-12 grossly intact, strength  intact Extremities: Perfused, no clubbing Skin: Normal skin turgor, no notable skin lesions seen Psychiatry: Mood normal // no visual hallucinations   Data Reviewed:  Labs reviewed: Cr 0.68, Hgb 10.2  Family Communication: Pt in room, family not at  bedside  Disposition: Status is: Inpatient Remains inpatient appropriate because: Severity of illness  Planned Discharge Destination: Skilled nursing facility     Author: Marylu Lund, MD 09/21/2021 4:17 PM  For on call review [email or web address].

## 2021-09-21 NOTE — Progress Notes (Signed)
Palliative:  Consult received.  Chart reviewed extensively.  Patient has been seen by inpatient and outpatient palliative in the past.  During those times plan was for full code/full scope care.  This admission, I attempted to speak with patient but he remains on BiPAP and was much too lethargic to hold conversation.  RN reports when patient is awake he is very confused and disoriented.  No family at bedside.  Attempted to call only listed family in chart Sister Deanna -called at both numbers listed and no answers.  Voicemail left with callback number.  Juel Burrow, DNP, AGNP-C Palliative Medicine Team Team Phone # [phone]  Pager # [phone]  NO CHARGE

## 2021-09-22 ENCOUNTER — Other Ambulatory Visit: Payer: Self-pay

## 2021-09-22 DIAGNOSIS — Z515 Encounter for palliative care: Secondary | ICD-10-CM

## 2021-09-22 DIAGNOSIS — J189 Pneumonia, unspecified organism: Secondary | ICD-10-CM | POA: Diagnosis not present

## 2021-09-22 DIAGNOSIS — J9601 Acute respiratory failure with hypoxia: Secondary | ICD-10-CM | POA: Diagnosis not present

## 2021-09-22 DIAGNOSIS — I82451 Acute embolism and thrombosis of right peroneal vein: Secondary | ICD-10-CM

## 2021-09-22 DIAGNOSIS — I2699 Other pulmonary embolism without acute cor pulmonale: Secondary | ICD-10-CM | POA: Diagnosis not present

## 2021-09-22 DIAGNOSIS — Z7189 Other specified counseling: Secondary | ICD-10-CM

## 2021-09-22 LAB — COMPREHENSIVE METABOLIC PANEL
ALT: 31 U/L (ref 0–44)
AST: 108 U/L — ABNORMAL HIGH (ref 15–41)
Albumin: 1.9 g/dL — ABNORMAL LOW (ref 3.5–5.0)
Alkaline Phosphatase: 199 U/L — ABNORMAL HIGH (ref 38–126)
Anion gap: 9 (ref 5–15)
BUN: 18 mg/dL (ref 8–23)
CO2: 24 mmol/L (ref 22–32)
Calcium: 9.1 mg/dL (ref 8.9–10.3)
Chloride: 116 mmol/L — ABNORMAL HIGH (ref 98–111)
Creatinine, Ser: 0.68 mg/dL (ref 0.61–1.24)
GFR, Estimated: >60 mL/min (ref >60)
Glucose, Bld: 152 mg/dL — ABNORMAL HIGH (ref 70–99)
Potassium: 2.9 mmol/L — ABNORMAL LOW (ref 3.5–5.1)
Sodium: 149 mmol/L — ABNORMAL HIGH (ref 135–145)
Total Bilirubin: 0.9 mg/dL (ref 0.3–1.2)
Total Protein: 5.7 g/dL — ABNORMAL LOW (ref 6.5–8.1)

## 2021-09-22 LAB — GLUCOSE, CAPILLARY
Glucose-Capillary: 152 mg/dL — ABNORMAL HIGH (ref 70–99)
Glucose-Capillary: 176 mg/dL — ABNORMAL HIGH (ref 70–99)
Glucose-Capillary: 100 mg/dL — ABNORMAL HIGH (ref 70–99)
Glucose-Capillary: 197 mg/dL — ABNORMAL HIGH (ref 70–99)

## 2021-09-22 LAB — CBC
HCT: 32.3 % — ABNORMAL LOW (ref 39.0–52.0)
Hemoglobin: 10.3 g/dL — ABNORMAL LOW (ref 13.0–17.0)
MCH: 31.7 pg (ref 26.0–34.0)
MCHC: 31.9 g/dL (ref 30.0–36.0)
MCV: 99.4 fL (ref 80.0–100.0)
Platelets: 220 10*3/uL (ref 150–400)
RBC: 3.25 MIL/uL — ABNORMAL LOW (ref 4.22–5.81)
RDW: 15.7 % — ABNORMAL HIGH (ref 11.5–15.5)
WBC: 8.1 10*3/uL (ref 4.0–10.5)
nRBC: 5.3 % — ABNORMAL HIGH (ref 0.0–0.2)

## 2021-09-22 LAB — MAGNESIUM: Magnesium: 2.3 mg/dL (ref 1.7–2.4)

## 2021-09-22 MED ORDER — LEVETIRACETAM IN NACL 1000 MG/100ML IV SOLN
1000.0000 mg | Freq: Two times a day (BID) | INTRAVENOUS | Status: DC
  Administered 2021-09-22 – 2021-09-24 (×5): 1000 mg via INTRAVENOUS
  Filled 2021-09-22 (×8): qty 100

## 2021-09-22 MED ORDER — FOLIC ACID 5 MG/ML IJ SOLN
1.0000 mg | Freq: Once | INTRAMUSCULAR | Status: AC
  Administered 2021-09-22: 1 mg via INTRAVENOUS
  Filled 2021-09-22: qty 0.2

## 2021-09-22 MED ORDER — SODIUM CHLORIDE 0.9 % IV SOLN: 1.0000 mg | Freq: Once | INTRAVENOUS | Status: DC

## 2021-09-22 MED ORDER — DEXTROSE 5 % IV SOLN: INTRAVENOUS | Status: DC

## 2021-09-22 MED ORDER — PANTOPRAZOLE SODIUM 40 MG IV SOLR
40.0000 mg | INTRAVENOUS | Status: DC
  Administered 2021-09-22 – 2021-09-24 (×3): 40 mg via INTRAVENOUS
  Filled 2021-09-22 (×3): qty 10

## 2021-09-22 MED ORDER — FUROSEMIDE 10 MG/ML IJ SOLN
20.0000 mg | Freq: Once | INTRAMUSCULAR | Status: AC
  Administered 2021-09-22: 20 mg via INTRAVENOUS
  Filled 2021-09-22: qty 2

## 2021-09-22 MED ORDER — DEXAMETHASONE SODIUM PHOSPHATE 4 MG/ML IJ SOLN
4.0000 mg | Freq: Two times a day (BID) | INTRAMUSCULAR | Status: DC
  Administered 2021-09-22 – 2021-09-24 (×5): 4 mg via INTRAVENOUS
  Filled 2021-09-22 (×5): qty 1

## 2021-09-22 MED ORDER — POTASSIUM CHLORIDE 10 MEQ/100ML IV SOLN
10.0000 meq | INTRAVENOUS | Status: AC
  Administered 2021-09-22 (×6): 10 meq via INTRAVENOUS
  Filled 2021-09-22 (×6): qty 100

## 2021-09-22 NOTE — Consult Note (Signed)
Consultation Note Date: 09/22/2021   Patient Name: Benjamin Willis.  DOB: [birth year]  MRN: [id number]  Age / Sex: 76 y.o., male  PCP: Benjamin Pollen, MD Referring Physician: Donne Hazel, MD  Reason for Consultation: Establishing goals of care  HPI/Patient Profile: 76 y.o. 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  Palliative consulted for goals of care.  Clinical Assessment and Goals of Care: I saw and examined Benjamin Willis today.  He was on BiPAP and woke up but was not really able to participate in conversation.  Is very lethargic throughout the day today.   I called and was able to reach his sister, Benjamin Willis.  We discussed clinical course as well as wishes moving forward in regard to advanced directives and care plan this hospitalization.  Concepts specific to code status discussed.  We discussed that in light of his underlying cancer with acute worsening issue of respiratory failure with multifocal pneumonia, care should be focused on interventions that would be in line with getting him closer to goal of getting back to home and spending time with family. I discussed with his sister regarding heroic interventions at the end-of-life and she agrees this would not be in line with prior expressed wishes for a natural death or be likely to lead to getting well enough to go back home.  Family in agreement with changing CODE STATUS to DO NOT RESUSCITATE.  We discussed difference between a aggressive medical intervention path and a palliative, comfort focused care path.  Values and goals of care important to patient and family were attempted to be elicited.  His  sister expressed that she does not want him to suffer and understands that he may be at end-of-life.  We discussed plan for follow-up meeting tomorrow at 2 PM to discuss further in person.   Concept of Hospice and Palliative Care were discussed   Questions and concerns addressed.   PMT will continue to support holistically.   SUMMARY OF RECOMMENDATIONS   -DNR/DNI -Continue current care.  His sister reports today that she does not want him to suffer if he continues to decline.  We discussed plan for follow-up meeting tomorrow at 2 PM to further discuss goals of care moving forward.  Code Status/Advance Care Planning: DNR  Prognosis:  Guarded/poor  Discharge Planning: To Be Determined      Primary Diagnoses: Present on Admission:  Acute respiratory failure with hypoxia (HCC)  Non-small cell lung cancer metastatic to brain [facility])  Uncontrolled type 2 diabetes mellitus with hyperglycemia ([place])   I have reviewed the medical record, interviewed the patient and family, and examined the patient. The following aspects are pertinent.  Past Medical History:  Diagnosis Date   Allergy    Diabetes mellitus without complication ([place])    Type II  Glaucoma 10/16/2020   History of radiation therapy    [place]- SBRT 02/21/19-02/28/19, Rt Lung- IMRT 02/29/20-04/16/20 Benjamin. Gery Willis   History of radiation therapy 10/30/2020   whole brain 10/17/2020-10/30/2020 Benjamin Willis   Hyperlipidemia    Hypertension    Lung cancer ([place])    Stage IA2 (cT1b, N0) non-small cell lung cancer (adenocarcinoma)   Social History   Socioeconomic History   Marital status: Single    Spouse name: Not on file   Number of children: 0   Years of education: Not on file   Highest education level: 9th grade  Occupational History   Not on file  Tobacco Use   Smoking status: Former    Packs/day: 1.00    Years: 48.00    Pack years: 48.00    Types: Cigarettes    Quit date: 02/20/2015    Years since quitting:  6.5   Smokeless tobacco: Former    Quit date: 10/2013  Vaping Use   Vaping Use: Never used  Substance and Sexual Activity   Alcohol use: No   Drug use: No   Sexual activity: Yes    Birth control/protection: Condom  Other Topics Concern   Not on file  Social History Narrative   Not on file   Social Determinants of Health   Financial Resource Strain: Not on file  Food Insecurity: Not on file  Transportation Needs: Not on file  Physical Activity: Not on file  Stress: Not on file  Social Connections: Not on file   Family History  Problem Relation Age of Onset   Lung cancer Neg Hx    Scheduled Meds:  chlorhexidine  15 mL Mouth Rinse BID   Chlorhexidine Gluconate Cloth  6 each Topical Daily   dexamethasone (DECADRON) injection  4 mg Intravenous Q12H   enoxaparin (LOVENOX) injection  60 mg Subcutaneous Q12H   insulin aspart  0-9 Units Subcutaneous TID WC   latanoprost  1 drop Both Eyes QHS   mouth rinse  15 mL Mouth Rinse q12n4p   metoprolol tartrate  5 mg Intravenous Q6H   pantoprazole (PROTONIX) IV  40 mg Intravenous Q24H   sodium chloride flush  10-40 mL Intracatheter Q12H   Continuous Infusions:  azithromycin Stopped (09/22/21 1134)   ceFEPime (MAXIPIME) IV Stopped (09/22/21 1645)   dextrose 100 mL/hr at 09/22/21 1700   levETIRAcetam Stopped (09/22/21 0959)   PRN Meds:.lip balm, morphine injection, nitroGLYCERIN, sodium chloride flush Medications Prior to Admission:  Prior to Admission medications   Medication Sig Start Date End Date Taking? Authorizing Provider  albuterol (VENTOLIN HFA) 108 (90 Base) MCG/ACT inhaler Inhale 2 puffs into the lungs every 6 (six) hours as needed for wheezing or shortness of breath. 01/19/19  Yes Icard, Benjamin Graves, DO  calcium carbonate (TUMS - DOSED IN MG ELEMENTAL CALCIUM) 500 MG chewable tablet Chew 2 tablets by mouth daily.   Yes [provider]  dexamethasone (DECADRON) 4 MG tablet Take 1 tablet (4 mg total) by mouth every 12  (twelve) hours. Patient taking differently: Take 4 mg by mouth in the morning and at bedtime. 07/15/21  Yes Willis, Benjamin A, MD  empagliflozin (JARDIANCE) 10 MG TABS tablet Take 1 tablet (10 mg total) by mouth daily before breakfast. 08/20/21 02/16/22 Yes Willis, Benjamin Bloomer, MD  folic acid (FOLVITE) 1 MG tablet Take 1 tablet (1 mg total) by mouth daily. 05/01/21  Yes Willis, Benjamin Bloomer, MD  gabapentin (NEURONTIN) 100 MG capsule Take 1 capsule (100 mg total)  by mouth 2 (two) times daily. 07/15/21  Yes Willis, Benjamin A, MD  guaiFENesin (MUCINEX) 600 MG 12 hr tablet Take 600 mg by mouth 2 (two) times daily. For 7 days   Yes [provider]  ipratropium-albuterol (DUONEB) 0.5-2.5 (3) MG/3ML SOLN Take 3 mLs by nebulization in the morning, at noon, in the evening, and at bedtime. For 5 days   Yes [provider]  latanoprost (XALATAN) 0.005 % ophthalmic solution Place 1 drop into both eyes at bedtime. 01/24/21  Yes Ailene Ards, NP  metFORMIN (GLUCOPHAGE) 500 MG tablet TAKE 1 TABLET BY MOUTH TWICE DAILY WITH MEALS Patient taking differently: Take 500 mg by mouth 2 (two) times daily with a meal. 06/25/21  Yes Willis, Benjamin Bloomer, MD  metoprolol tartrate (LOPRESSOR) 25 MG tablet Take 1 tablet (25 mg total) by mouth 2 (two) times daily. 09/10/21  Yes Rai, Ripudeep K, MD  multivitamin (ONE-A-DAY MEN'S) TABS tablet Take 1 tablet by mouth daily with breakfast.   Yes [provider]  Nutritional Supplement LIQD Take 120 mLs by mouth 3 (three) times daily.   Yes [provider]  pantoprazole (PROTONIX) 40 MG tablet Take 1 tablet (40 mg total) by mouth daily. Patient taking differently: Take 40 mg by mouth daily before breakfast. 05/01/21  Yes Willis, Benjamin Bloomer, MD  rosuvastatin (CRESTOR) 20 MG tablet Take 20 mg by mouth every evening.   Yes [provider]  tirzepatide Darcel Bayley) 5 MG/0.5ML Pen Inject 5 mg into the skin once a week. Patient taking  differently: Inject 5 mg into the skin every Monday. 08/20/21  Yes Willis, Benjamin Bloomer, MD  blood glucose meter kit and supplies KIT Dispense based on patient and insurance preference. Use up to four times daily as directed. 10/23/20   Elodia Florence., MD  Blood Pressure Monitoring (BLOOD PRESSURE MONITOR/ARM) DEVI Check BP once a day. DX I 10 04/18/19   Benjamin Pollen, MD  cefTRIAXone (ROCEPHIN) 1 g injection Inject 1 g into the muscle once.    [provider]  diphenhydrAMINE-zinc acetate (BENADRYL) cream Apply topically 2 (two) times daily as needed for itching. Patient not taking: Reported on 09/18/2021 07/15/21   Niel Hummer A, MD  levETIRAcetam (KEPPRA) 1000 MG tablet Take 1 tablet by mouth twice daily Patient not taking: Reported on 09/18/2021 05/29/21   Benjamin Pollen, MD  sodium chloride 0.9 % infusion Inject 500 mLs into the vein once. 50 ml/hr for hydration    [provider]   No Known Allergies Review of Systems Unable to obtain  Physical Exam General: Sleepy but opens eyes, on bipap Heart: Regular rate and rhythm. No murmur appreciated. Lungs: Decreased air movement Abdomen: Soft, nontender, nondistended, positive bowel sounds.   Ext: No significant edema Skin: Warm and dry  Vital Signs: BP 133/81   Pulse (!) 109   Temp 97.6 F (36.4 C) (Axillary)   Resp 14   Wt 63 kg   SpO2 99%   BMI 22.42 kg/m  Pain Scale: 0-10   Pain Score: Asleep   SpO2: SpO2: 99 % O2 Device:SpO2: 99 % O2 Flow Rate: .O2 Flow Rate (L/min): 15 L/min  IO: Intake/output summary:  Intake/Output Summary (Last 24 hours) at 09/22/2021 1845 Last data filed at 09/22/2021 1700 Gross per 24 hour  Intake 2615.03 ml  Output 2400 ml  Net 215.03 ml    LBM: Last BM Date : 09/22/21 Baseline Weight: Weight: 63 kg Most recent weight: Weight: 63 kg  Palliative Assessment/Data:   Flowsheet Rows    Flowsheet Row Most Recent Value  Intake Tab   Referral  Department Hospitalist  Unit at Time of Referral Med/Surg Unit  Palliative Care Primary Diagnosis Cancer  Date Notified 09/21/21  Palliative Care Type Return patient Palliative Care  Reason for referral Clarify Goals of Care  Date of Admission 09/18/21  Date first seen by Palliative Care 09/21/21  # of days Palliative referral response time 0 Day(s)  # of days IP prior to Palliative referral 3  Clinical Assessment   Palliative Performance Scale Score 10%  Psychosocial & Spiritual Assessment   Palliative Care Outcomes   Patient/Family meeting held? Yes  Who was at the meeting? Sister via phone       Time In: 1625 Time Out: 1740 Time Total: 85 Greater than 50%  of this time was spent counseling and coordinating care related to the above assessment and plan.  Signed by: Micheline Rough, MD   Please contact Palliative Medicine Team phone at [phone] for questions and concerns.  For individual provider: See Shea Evans

## 2021-09-22 NOTE — Progress Notes (Signed)
Progress Note   Patient: Benjamin Willis. [id number]:[id number] DOB: 1945/05/20 DOA: 09/18/2021     4 DOS: the patient was seen and examined on 09/22/2021   Brief hospital course: 76 y.o. 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   Unable to provide full history from patient while he is on BiPAP.  Reports increasing shortness of breath with cough for the past 2 days.   Last admitted from 5/1-09/10/21 with acute metabolic encephalopathy secondary to UTI and had physical deconditioning with weakness and falls and was discharged to SNF for rehab.    In the ED, he had temperature of 99.9 F, was tachycardic and was hypoxic down to 80% initially placed on nonrebreather and ultimately transitioned to BiPAP.  No leukocytosis, slight anemia with hemoglobin of 12.3.  Lactate of 2. CMP with CBG of 210 and elevated AST of 80, ALT of 46, alkaline phosphatase of 138. Troponin mildly elevated at 27. Negative flu and COVID PCR. CTA chest showing small segmental left upper lobe pulmonary emboli with minimal clot burden and no right heart strain.  There is bilateral interstitial and groundglass opacity favoring multifocal pneumonia.   Assessment and Plan: * Acute respiratory failure with hypoxia (HCC) Presented with hypoxia down to 80% requiring BiPAP secondary to pulmonary embolism and multifocal pneumonia - CTA chest showing small subsegmental left upper lobe pulmonary embolism with minimal clot.  No heart strain.  Bilateral interstitial groundglass opacity favoring multifocal pneumonia -Noted to have difficulty weaning off bipap -Serial CXR with findings suggesting worsening infiltrates -Currently on cefepime with added azithromycin -2d echo reviewed, no evidence of heart strain. Normal LVEF -Will give trial of low dose lasix to assist wean  Non-small cell lung cancer metastatic to brain [facility]) S/p whole  brain radiation on chemotherapy -Followed by Dr. Lorenso Courier with oncology -Last chemo cycle 13 on 4/20 -Continue BID decadron -continue BID Keppra  Acute deep vein thrombosis (DVT) of right peroneal vein (HCC) -Acute R peroneal DVT on [place] -Cont therapeutic anticoagulation per below -No evidence of acute blood loss  Hypernatremia -clinically appears dry on exam -Na trending up -Given continued bipap dependence, held fluids for now and given dose of lasix  Abnormal LFTs elevated AST of 80, ALT of 46, alkaline phosphatase of 138 at time of presentation -LFT's stable at this time  Pressure injury of skin Stage II buttocks ulcer - WOC consulted with recs noted  Acute pulmonary embolism ([place]) -small PE noted on imaging -LE dopplers pos for acute DVT in R peroneal veins -Cont therapeutic anticoagulation  Uncontrolled type 2 diabetes mellitus with hyperglycemia (HCC) Recent hemoglobin A1c of 10.6 on 5/4 -Continued on sensitive SSI -glycemic trends stable  Goals of care, counseling/discussion -Critical illness at presentation -Pt currently Full Code -Difficult weaning O2 in setting of known malignancy and new PE with worsening PNA -Have consulted Palliative Care, appreciate input        Subjective: Off bipap this AM however O2 sats in the 70's. Pt denied feeling sob  Physical Exam: Vitals:   09/22/21 1100 09/22/21 1134 09/22/21 1152 09/22/21 1203  BP: 94/60   (!) 141/93  Pulse:  (!) 110 (!) 109 (!) 109  Resp: 18 12 17  (!) 9  Temp:    97.6 F (36.4 C)  TempSrc:    Axillary  SpO2:  92% 95% 98%  Weight:       General exam: Conversant, in no acute  distress Respiratory system: normal chest rise, clear, no audible wheezing Cardiovascular system: regular rhythm, s1-s2 Gastrointestinal system: Nondistended, nontender, pos BS Central nervous system: No seizures, no tremors Extremities: No cyanosis, no joint deformities Skin: No rashes, no pallor Psychiatry: Affect normal //  no auditory hallucinations   Data Reviewed:  Labs reviewed: Na 149, Cr 0.68  Family Communication: Pt in room, discussed with sister over phone  Disposition: Status is: Inpatient Remains inpatient appropriate because: Severity of illness  Planned Discharge Destination: Skilled nursing facility     Author: Marylu Lund, MD 09/22/2021 1:52 PM  For on call review [email or web address].

## 2021-09-22 NOTE — Progress Notes (Signed)
OT Cancellation Note  Patient Details Name: Benjamin Willis. MRN: [id number] DOB: 09-17-1945   Cancelled Treatment:    Reason Eval/Treat Not Completed: Patient not medically ready. Continues to be on Bipap. Will follow.  Verona Hartshorn L Devory Mckinzie 09/22/2021, 7:06 AM

## 2021-09-23 DIAGNOSIS — Z7189 Other specified counseling: Secondary | ICD-10-CM | POA: Diagnosis not present

## 2021-09-23 DIAGNOSIS — J189 Pneumonia, unspecified organism: Secondary | ICD-10-CM | POA: Diagnosis not present

## 2021-09-23 DIAGNOSIS — C3490 Malignant neoplasm of unspecified part of unspecified bronchus or lung: Secondary | ICD-10-CM | POA: Diagnosis not present

## 2021-09-23 DIAGNOSIS — R7989 Other specified abnormal findings of blood chemistry: Secondary | ICD-10-CM | POA: Diagnosis not present

## 2021-09-23 DIAGNOSIS — I82451 Acute embolism and thrombosis of right peroneal vein: Secondary | ICD-10-CM | POA: Diagnosis not present

## 2021-09-23 DIAGNOSIS — J9601 Acute respiratory failure with hypoxia: Secondary | ICD-10-CM | POA: Diagnosis not present

## 2021-09-23 LAB — CULTURE, BLOOD (ROUTINE X 2)
Culture: NO GROWTH
Culture: NO GROWTH
Special Requests: ADEQUATE

## 2021-09-23 LAB — COMPREHENSIVE METABOLIC PANEL
ALT: 27 U/L (ref 0–44)
AST: 97 U/L — ABNORMAL HIGH (ref 15–41)
Albumin: 1.7 g/dL — ABNORMAL LOW (ref 3.5–5.0)
Alkaline Phosphatase: 199 U/L — ABNORMAL HIGH (ref 38–126)
Anion gap: 10 (ref 5–15)
BUN: 24 mg/dL — ABNORMAL HIGH (ref 8–23)
CO2: 23 mmol/L (ref 22–32)
Calcium: 8.6 mg/dL — ABNORMAL LOW (ref 8.9–10.3)
Chloride: 109 mmol/L (ref 98–111)
Creatinine, Ser: 0.89 mg/dL (ref 0.61–1.24)
GFR, Estimated: >60 mL/min (ref >60)
Glucose, Bld: 299 mg/dL — ABNORMAL HIGH (ref 70–99)
Potassium: 3.4 mmol/L — ABNORMAL LOW (ref 3.5–5.1)
Sodium: 142 mmol/L (ref 135–145)
Total Bilirubin: 0.8 mg/dL (ref 0.3–1.2)
Total Protein: 5.3 g/dL — ABNORMAL LOW (ref 6.5–8.1)

## 2021-09-23 LAB — GLUCOSE, CAPILLARY
Glucose-Capillary: 287 mg/dL — ABNORMAL HIGH (ref 70–99)
Glucose-Capillary: 247 mg/dL — ABNORMAL HIGH (ref 70–99)
Glucose-Capillary: 238 mg/dL — ABNORMAL HIGH (ref 70–99)
Glucose-Capillary: 261 mg/dL — ABNORMAL HIGH (ref 70–99)

## 2021-09-23 MED ORDER — METOPROLOL TARTRATE 5 MG/5ML IV SOLN
2.5000 mg | Freq: Four times a day (QID) | INTRAVENOUS | Status: DC
  Administered 2021-09-23 – 2021-09-24 (×3): 2.5 mg via INTRAVENOUS
  Filled 2021-09-23 (×2): qty 5

## 2021-09-23 MED ORDER — IPRATROPIUM-ALBUTEROL 0.5-2.5 (3) MG/3ML IN SOLN
3.0000 mL | RESPIRATORY_TRACT | Status: DC
  Administered 2021-09-23: 3 mL via RESPIRATORY_TRACT
  Filled 2021-09-23: qty 3

## 2021-09-23 MED ORDER — IPRATROPIUM-ALBUTEROL 0.5-2.5 (3) MG/3ML IN SOLN
3.0000 mL | Freq: Three times a day (TID) | RESPIRATORY_TRACT | Status: DC
  Administered 2021-09-24: 3 mL via RESPIRATORY_TRACT
  Filled 2021-09-23: qty 3

## 2021-09-23 MED ORDER — INSULIN ASPART 100 UNIT/ML IJ SOLN
5.0000 [IU] | Freq: Once | INTRAMUSCULAR | Status: AC
  Administered 2021-09-23: 5 [IU] via SUBCUTANEOUS

## 2021-09-23 MED ORDER — MORPHINE SULFATE (PF) 2 MG/ML IV SOLN
2.0000 mg | INTRAVENOUS | Status: DC | PRN
  Administered 2021-09-24 – 2021-09-25 (×4): 2 mg via INTRAVENOUS
  Filled 2021-09-23 (×4): qty 1

## 2021-09-23 MED ORDER — LORAZEPAM 2 MG/ML IJ SOLN
1.0000 mg | INTRAMUSCULAR | Status: DC | PRN
  Administered 2021-09-23: 1 mg via INTRAVENOUS
  Filled 2021-09-23: qty 1

## 2021-09-23 NOTE — Progress Notes (Signed)
PT Cancellation Note  Patient Details Name: Benjamin Willis. MRN: [id number] DOB: Sep 22, 1945   Cancelled Treatment:    Reason Eval/Treat Not Completed: Medical issues which prohibited therapy, will wait for GOC/Palliative Care meeting with family today. [place] Pager [phone] Office [phone]    Claretha Cooper 09/23/2021, 6:51 AM

## 2021-09-23 NOTE — Progress Notes (Signed)
Progress Note   Patient: Benjamin Willis. [id number] DOB: [birth year] DOA: 09/18/2021     5 DOS: the patient was seen and examined on 09/23/2021   Brief hospital course: 76 y.o. 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   Unable to provide full history from patient while he is on BiPAP.  Reports increasing shortness of breath with cough for the past 2 days.   Last admitted from 5/1-09/10/21 with acute metabolic encephalopathy secondary to UTI and had physical deconditioning with weakness and falls and was discharged to SNF for rehab.    In the ED, he had temperature of 99.9 F, was tachycardic and was hypoxic down to 80% initially placed on nonrebreather and ultimately transitioned to BiPAP.  No leukocytosis, slight anemia with hemoglobin of 12.3.  Lactate of 2. CMP with CBG of 210 and elevated AST of 80, ALT of 46, alkaline phosphatase of 138. Troponin mildly elevated at 27. Negative flu and COVID PCR. CTA chest showing small segmental left upper lobe pulmonary emboli with minimal clot burden and no right heart strain.  There is bilateral interstitial and groundglass opacity favoring multifocal pneumonia.   Assessment and Plan: * Acute respiratory failure with hypoxia (HCC) Presented with hypoxia down to 80% requiring BiPAP secondary to pulmonary embolism and multifocal pneumonia - CTA chest showing small subsegmental left upper lobe pulmonary embolism with minimal clot.  No heart strain.  Bilateral interstitial groundglass opacity favoring multifocal pneumonia -Noted to have difficulty weaning off bipap -Serial CXR with findings suggesting worsening infiltrates -Currently on cefepime with added azithromycin -2d echo reviewed, no evidence of heart strain. Normal LVEF -Good urine output with recent trial of low dose lasix, however pt still is unable to wean from bipap  Non-small  cell lung cancer metastatic to brain [facility]) S/p whole brain radiation on chemotherapy -Followed by Dr. Lorenso Courier with oncology -Last chemo cycle 13 on 4/20 -Continue BID decadron -continue BID Keppra  Acute deep vein thrombosis (DVT) of right peroneal vein (HCC) -Acute R peroneal DVT on [place] -Cont therapeutic anticoagulation per below -No evidence of acute blood loss, hgb remains stable  Hypernatremia -clinically appears dry on exam -Sodium now normalized with D5 fluids  Abnormal LFTs elevated AST of 80, ALT of 46, alkaline phosphatase of 138 at time of presentation -LFT's have remained stable  Pressure injury of skin Stage II buttocks ulcer - WOC consulted with recs noted  Acute pulmonary embolism ([place]) -small PE noted on imaging -LE dopplers pos for acute DVT in R peroneal veins -Cont therapeutic anticoagulation  Uncontrolled type 2 diabetes mellitus with hyperglycemia (HCC) Recent hemoglobin A1c of 10.6 on 5/4 -Continued on sensitive SSI -glycemic trends stable  Goals of care, counseling/discussion -Critical illness at presentation -Difficult weaning O2 in setting of known malignancy and new PE with worsening PNA -Have consulted Palliative Care, appreciate input -Now DNR/DNI        Subjective: Difficult to assess. Pt seems more drowsy and lethargic this AM  Physical Exam: Vitals:   09/23/21 0734 09/23/21 1112 09/23/21 1141 09/23/21 1200  BP:    105/84  Pulse: 97  76 75  Resp: 15  13 12   Temp:    (!) 97.3 F (36.3 C)  TempSrc:    Axillary  SpO2: 98%  98% 99%  Weight:      Height:  5\' 6"  (1.676 m)     General exam: Asleep, laying in  bed, in nad Respiratory system: Normal respiratory effort, no wheezing, remains on bipap Cardiovascular system: regular rate, s1, s2 Gastrointestinal system: Soft, nondistended, positive BS Central nervous system: CN2-12 grossly intact, strength intact Extremities: Perfused, no clubbing Skin: Normal skin turgor, no notable skin  lesions seen Psychiatry: Mood normal // no visual hallucinations   Data Reviewed:  Labs reviewed: Na 142, Cr 0.89  Family Communication: Pt in room, Recently discussed with sister over phone  Disposition: Status is: Inpatient Remains inpatient appropriate because: Severity of illness  Planned Discharge Destination: Skilled nursing facility     Author: Marylu Lund, MD 09/23/2021 2:39 PM  For on call review [email or web address].

## 2021-09-23 NOTE — Patient Care Conference (Signed)
Called and updated pt's sister over phone. All questions answered.

## 2021-09-23 NOTE — Progress Notes (Signed)
OT Cancellation Note  Patient Details Name: Benjamin Willis. MRN: [id number] DOB: 12-10-1945   Cancelled Treatment:    Reason Eval/Treat Not Completed: Medical issues which prohibited therapy Patient remains on BiPAP with pending palliative meeting with family today. OT to wait for meeting for any updates to plan of care  Jackelyn Poling OTR/L, [place] Office# [phone] Pager# [phone] 09/23/2021, 8:37 AM

## 2021-09-23 NOTE — Progress Notes (Signed)
Daily Progress Note   Patient Name: Benjamin Willis.       Date: 09/23/2021 DOB: March 30, 1946  Age: 76 y.o. MRN#: [id number] Attending Physician: Donne Hazel, MD Primary Care Physician: Horald Pollen, MD Admit Date: 09/18/2021  Reason for Consultation/Follow-up: Establishing goals of care  Subjective: I saw and examined Benjamin Willis today.  He is lethargic and does not arouse to have any sort of meaningful interaction with me today.  Has been dependent upon BiPAP most of the day.  I met with his sister, [name], at the bedside.  We reviewed his clinical course and the fact that he continues to worsen daily.  She expressed that she sees that he is not getting better and tells me that she does not want him to suffer if we are approaching end-of-life.  She says that he shared this wish with her previously.  We talked through options for care including aggressive care versus palliative, comfort focused approach.  Discussed that he has been clear with her recently that he did not want to be placed on any sort of life support and we talked about how, in some ways, BiPAP was serving as life support at this point as he is now dependent upon it and is not really making progress toward any sort of recovery for his lung function long-term.  Discussed multifocal pneumonia, his lung cancer, and immune compromise related to cancer.  Following discussion, his sister stated that she thought that a plan to transition off of BiPAP and focus on his comfort would be what he would wish for if he were to speak for himself.  She does want to discuss this further with her nephews and nieces prior to him being taken off of BiPAP and transition to comfort care.  We discussed plan for follow-up tomorrow around 10 AM  when she arrives to the hospital.  We also discussed that plan would be to not escalate care in the event of decompensation tonight.  Length of Stay: 5  Current Medications: Scheduled Meds:   chlorhexidine  15 mL Mouth Rinse BID   Chlorhexidine Gluconate Cloth  6 each Topical Daily   dexamethasone (DECADRON) injection  4 mg Intravenous Q12H   enoxaparin (LOVENOX) injection  60 mg Subcutaneous Q12H   insulin aspart  0-9 Units Subcutaneous TID WC  latanoprost  1 drop Both Eyes QHS   mouth rinse  15 mL Mouth Rinse q12n4p   metoprolol tartrate  2.5 mg Intravenous Q6H   pantoprazole (PROTONIX) IV  40 mg Intravenous Q24H   sodium chloride flush  10-40 mL Intracatheter Q12H    Continuous Infusions:  ceFEPime (MAXIPIME) IV Stopped (09/23/21 1555)   dextrose 100 mL/hr at 09/23/21 1743   levETIRAcetam Stopped (09/23/21 1004)    PRN Meds: lip balm, LORazepam, morphine injection, nitroGLYCERIN, sodium chloride flush  Physical Exam         General: Largely somnolent, on BiPAP.   Heart: Regular rate and rhythm. No murmur appreciated. Lungs: Decreased Abdomen: Soft, nontender, nondistended, positive bowel sounds.   Ext: No significant edema Skin: Warm and dry  Vital Signs: BP 107/84 (BP Location: Left Arm)   Pulse 63   Temp (!) 97.5 F (36.4 C) (Axillary)   Resp 17   Ht _0  (1.676 m)   Wt 63 kg   SpO2 100%   BMI 22.42 kg/m  SpO2: SpO2: 100 % O2 Device: O2 Device: Bi-PAP O2 Flow Rate: O2 Flow Rate (L/min): 15 L/min  Intake/output summary:  Intake/Output Summary (Last 24 hours) at 09/23/2021 1808 Last data filed at 09/23/2021 1614 Gross per 24 hour  Intake 2500.42 ml  Output 650 ml  Net 1850.42 ml   LBM: Last BM Date : 09/22/21 Baseline Weight: Weight: 63 kg Most recent weight: Weight: 63 kg       Palliative Assessment/Data:    Flowsheet Rows    Flowsheet Row Most Recent Value  Intake Tab   Referral Department Hospitalist  Unit at Time of Referral Med/Surg Unit   Palliative Care Primary Diagnosis Cancer  Date Notified 09/21/21  Palliative Care Type Return patient Palliative Care  Reason for referral Clarify Goals of Care  Date of Admission 09/18/21  Date first seen by Palliative Care 09/21/21  # of days Palliative referral response time 0 Day(s)  # of days IP prior to Palliative referral 3  Clinical Assessment   Palliative Performance Scale Score 10%  Psychosocial & Spiritual Assessment   Palliative Care Outcomes   Patient/Family meeting held? Yes  Who was at the meeting? Sister via phone       Patient Active Problem List   Diagnosis Date Noted   Left leg weakness 07/03/2021   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[place]) 04/01/2021   Shingles rash 07/07/2021   Fall at home, initial encounter 07/03/2021   Acute respiratory failure with hypoxia ([place]) 09/18/2021   Acute deep vein thrombosis (DVT) of right peroneal vein ([place]) 09/21/2021   Hypernatremia 09/20/2021   Pressure injury of skin 09/19/2021   Abnormal LFTs 09/19/2021   Acute pulmonary embolism ([place]) 09/18/2021   UTI (urinary tract infection) 09/02/2021   Uncontrolled type 2 diabetes mellitus with hyperglycemia ([place]) 08/20/2021   Weakness 08/20/2021   At risk for falls 08/20/2021   Cognitive impairment 07/04/2021   Leukopenia with mild neutropenia 07/02/2021   Anemia associated with chemotherapy 07/02/2021   Port-A-Cath in place 12/14/2020   Brain metastases    Palliative care by specialist    Metastatic lung cancer (metastasis from lung to other site) [facility]) 10/16/2020   Glaucoma 10/16/2020   Atherosclerosis of aorta ([place]) 05/31/2020   Primary cancer of right upper lobe of lung ([place]) 02/07/2019   Lung nodule 02/02/2019   Hypertension associated with type 2 diabetes mellitus ([place]) 05/24/2018   Dyslipidemia 05/24/2018   Dyslipidemia associated with type  2 diabetes mellitus ([place]) 06/09/2017   Goals of care, counseling/discussion 01/01/2017    Palliative Care  Assessment & Plan   Patient Profile: 77 y.o. 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  Palliative consulted for goals of care.  Recommendations/Plan: DNR/DNI No escalation of care in the event of decompensation tonight. I made addition of morphine and Ativan for use as needed to ensure that he is not uncomfortable.  They should be given regardless of concern for side effects such as hypotension or respiratory depression if they are needed to ensure that he is not uncomfortable. His sister would like to discuss plan with the rest of the family before transitioning to comfort.  We did discuss today potential for initiation of medication to ensure that he is not short of breath and working to liberate him from BiPAP with plan for comfort moving forward.  Plan for follow-up meeting tomorrow morning.  She will be present starting around 10 AM.  Code Status:    Code Status Orders  (From admission, onward)           Start     Ordered   09/22/21 1742  Do not attempt resuscitation (DNR)  Continuous       Question Answer Comment  In the event of cardiac or respiratory ARREST Do not call a "code blue"   In the event of cardiac or respiratory ARREST Do not perform Intubation, CPR, defibrillation or ACLS   In the event of cardiac or respiratory ARREST Use medication by any route, position, wound care, and other measures to relive pain and suffering. May use oxygen, suction and manual treatment of airway obstruction as needed for comfort.      09/22/21 1741           Code Status History     Date Active Date Inactive Code Status Order ID Comments User Context   09/19/2021 0038 09/22/2021 1741 Full Code [id number]  Orene Desanctis, DO Inpatient    09/02/2021 1527 09/10/2021 1809 Full Code [id number]  Jonnie Finner, DO Inpatient   07/02/2021 1627 07/15/2021 1655 Full Code [id number]  Antonieta Pert, MD ED   01/07/2021 1618 01/10/2021 2013 Full Code [id number]  Jonnie Finner, DO Inpatient   10/16/2020 1115 10/23/2020 2054 Full Code [id number]  Karmen Bongo, MD ED      Advance Directive Documentation    Flowsheet Row Most Recent Value  Type of Advance Directive [place]  [per previous hx]  Pre-existing out of facility DNR order (yellow form or pink MOST form) --  "MOST" Form in Place? --       Prognosis:  Poor  Discharge Planning: Will likely be transition to full comfort tomorrow  Care plan was discussed with sister  Thank you for allowing the Palliative Medicine Team to assist in the care of this patient.   Time In: 1420 Time Out: 1515 Total Time 55 Prolonged Time Billed No   Jasyah Theurer Domingo Cocking, MD  Please contact Palliative Medicine Team phone at [phone] for questions and concerns.

## 2021-09-23 NOTE — Progress Notes (Signed)
Speech Language Pathology Treatment: Dysphagia  Patient Details Name: Ishmael Berkovich. MRN: [id number] DOB: 01/10/1946 Today's Date: 09/23/2021 Time: 0045-9977 SLP Time Calculation (min) (ACUTE ONLY): 15 min  Assessment / Plan / Recommendation Clinical Impression  Patient seen by SLP for skilled treatment session focused on dysphagia goals. Patient's RN in room and another RN assisted her with transitioning patient from BiPAP to nasal cannula. After initial desaturations to as low as 85, patient then became stable at 95%. SLP present while RN administering toothette sponge soaked with water and water via straw. Patient formed lips around straw but strength was not adequate for seal and patient not able to suck through straw. He accepted toothette sponges soaked with water, leaning head out slightly and pursing lips in anticipation, however he did not exhibit any ability to orally manipulate or transit liquids. Unfortunately, patient has been exhibiting an overall decline in function. [place] meeting with family and palliative care team planned for 2pm today. SLP will not make any changes in PO recommendations and will await decisions of [place] meeting to determine  any further treatment or PO recommendations.     HPI HPI: 76 y.o. 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 Has been on BiPAP 5/17-5/19.  CTA chest showing small segmental left upper lobe pulmonary emboli with minimal clot burden and no right heart strain.  There is bilateral interstitial and groundglass opacity favoring multifocal pneumonia. Last admitted from 5/1-09/10/21 with acute metabolic encephalopathy secondary to UTI and had physical deconditioning with weakness and falls and was discharged to SNF for rehab. Pt evaluated on 09/07/21 initially showing confusion and lethargy with difficulty drinking, but over 24 resumed baseline function of  self feeding regular textrues with no signs of aspiration.      SLP Plan  Continue with current plan of care      Recommendations for follow up therapy are one component of a multi-disciplinary discharge planning process, led by the attending physician.  Recommendations may be updated based on patient status, additional functional criteria and insurance authorization.    Recommendations  Diet recommendations: Other(comment) (will await [place] meeting with family 2pm today (5/21)) Liquids provided via: Straw;Cup Medication Administration: Via alternative means Supervision: Trained caregiver to feed patient Postural Changes and/or Swallow Maneuvers: Seated upright 90 degrees                Oral Care Recommendations: Oral care QID;Staff/trained caregiver to provide oral care Follow Up Recommendations: No SLP follow up Assistance recommended at discharge: Frequent or constant Supervision/Assistance SLP Visit Diagnosis: Dysphagia, unspecified (R13.10) Plan: Continue with current plan of care         Sonia Baller, MA, CCC-SLP Speech Therapy

## 2021-09-24 DIAGNOSIS — Z7189 Other specified counseling: Secondary | ICD-10-CM | POA: Diagnosis not present

## 2021-09-24 DIAGNOSIS — J189 Pneumonia, unspecified organism: Secondary | ICD-10-CM | POA: Diagnosis not present

## 2021-09-24 DIAGNOSIS — J9601 Acute respiratory failure with hypoxia: Secondary | ICD-10-CM | POA: Diagnosis not present

## 2021-09-24 DIAGNOSIS — I82451 Acute embolism and thrombosis of right peroneal vein: Secondary | ICD-10-CM | POA: Diagnosis not present

## 2021-09-24 LAB — COMPREHENSIVE METABOLIC PANEL
ALT: 25 U/L (ref 0–44)
AST: 87 U/L — ABNORMAL HIGH (ref 15–41)
Albumin: 1.6 g/dL — ABNORMAL LOW (ref 3.5–5.0)
Alkaline Phosphatase: 193 U/L — ABNORMAL HIGH (ref 38–126)
Anion gap: 7 (ref 5–15)
BUN: 31 mg/dL — ABNORMAL HIGH (ref 8–23)
CO2: 24 mmol/L (ref 22–32)
Calcium: 8.7 mg/dL — ABNORMAL LOW (ref 8.9–10.3)
Chloride: 108 mmol/L (ref 98–111)
Creatinine, Ser: 1.17 mg/dL (ref 0.61–1.24)
GFR, Estimated: >60 mL/min (ref >60)
Glucose, Bld: 258 mg/dL — ABNORMAL HIGH (ref 70–99)
Potassium: 3 mmol/L — ABNORMAL LOW (ref 3.5–5.1)
Sodium: 139 mmol/L (ref 135–145)
Total Bilirubin: 0.5 mg/dL (ref 0.3–1.2)
Total Protein: 5.1 g/dL — ABNORMAL LOW (ref 6.5–8.1)

## 2021-09-24 LAB — CBC
HCT: 25.6 % — ABNORMAL LOW (ref 39.0–52.0)
Hemoglobin: 8.6 g/dL — ABNORMAL LOW (ref 13.0–17.0)
MCH: 33.2 pg (ref 26.0–34.0)
MCHC: 33.6 g/dL (ref 30.0–36.0)
MCV: 98.8 fL (ref 80.0–100.0)
Platelets: 206 10*3/uL (ref 150–400)
RBC: 2.59 MIL/uL — ABNORMAL LOW (ref 4.22–5.81)
RDW: 16 % — ABNORMAL HIGH (ref 11.5–15.5)
WBC: 10.6 10*3/uL — ABNORMAL HIGH (ref 4.0–10.5)
nRBC: 3.6 % — ABNORMAL HIGH (ref 0.0–0.2)

## 2021-09-24 LAB — GLUCOSE, CAPILLARY: Glucose-Capillary: 259 mg/dL — ABNORMAL HIGH (ref 70–99)

## 2021-09-24 MED ORDER — IPRATROPIUM-ALBUTEROL 0.5-2.5 (3) MG/3ML IN SOLN: 3.0000 mL | RESPIRATORY_TRACT | Status: DC | PRN

## 2021-09-24 MED ORDER — CEFEPIME HCL 2 G IV SOLR
2.0000 g | Freq: Two times a day (BID) | INTRAVENOUS | Status: DC
  Filled 2021-09-24: qty 12.5

## 2021-09-24 MED ORDER — POTASSIUM CHLORIDE 10 MEQ/100ML IV SOLN
10.0000 meq | INTRAVENOUS | Status: AC
  Administered 2021-09-24 (×3): 10 meq via INTRAVENOUS
  Filled 2021-09-24 (×4): qty 100

## 2021-09-24 MED ORDER — GLYCOPYRROLATE 0.2 MG/ML IJ SOLN: 0.2000 mg | Freq: Four times a day (QID) | INTRAMUSCULAR | Status: DC | PRN

## 2021-09-24 NOTE — TOC Progression Note (Signed)
Transition of Care (TOC) - Progression Note   Patient Details  Name: Desmund Elman. MRN: [id number] Date of Birth: July 02, 1945  Transition of Care [facility]) CM/SW [place], LCSW Phone Number: 09/24/2021, 1:30 PM  Clinical Narrative: Plan is now for patient to discharge to residential hospice. CSW confirmed with sister, Jonn Shingles, that family is requesting [facility]. CSW called Cherie with [facility] to make a referral. [facility] does not have a bed available today, but will follow up with family today and keep TOC updated regarding bed availability. TOC to follow.  Expected Discharge Plan: [place] Barriers to Discharge: Hospice Bed not available  Expected Discharge Plan and Services Expected Discharge Plan: [place] In-house Referral: Clinical Social Work Discharge Planning Services: CM Consult Post Acute Care Choice: Residential Hospice Bed Living arrangements for the past 2 months: [facility]          DME Arranged: N/A DME Agency: NA  Readmission Risk Interventions    09/04/2021    1:53 PM 07/05/2021    1:02 PM 01/10/2021    9:52 AM  Readmission Risk Prevention Plan  Transportation Screening Complete Complete Complete  PCP or Specialist Appt within 3-5 Days Complete Complete Complete  HRI or Home Care Consult Complete Complete Complete  Social Work Consult for [place] Planning/Counseling Complete Complete Complete  Palliative Care Screening Not Applicable Complete Not Applicable  Medication Review Press photographer) Complete Complete Complete

## 2021-09-24 NOTE — Progress Notes (Signed)
PT Cancellation Note  Patient Details Name: Benjamin Willis. MRN: [id number] DOB: [birth year]   Cancelled Treatment:    Reason Eval/Treat Not Completed:   Medical issues which prohibited therapy  Patient remains on BiPAP at this time. Patient noted to have another meeting this AM with notes from 5/22 indicating comfort measures. PT to sign off at this time.     [facility]  Office: [phone] Pager: [phone]

## 2021-09-24 NOTE — Progress Notes (Addendum)
Daily Progress Note   Patient Name: Benjamin Willis.       Date: 09/24/2021 DOB: 1945/07/14  Age: 76 y.o. MRN#: [id number] Attending Physician: Donne Hazel, MD Primary Care Physician: Horald Pollen, MD Admit Date: 09/18/2021  Reason for Consultation/Follow-up: Establishing goals of care  Subjective: I saw and examined [name] today.  He is lethargic and does not arouse to have any sort of meaningful interaction with me today. Currently off the BIPAP. He is not awake not alert, has pursed lip breathing and is also using accessory muscles for respiration. Has some restless movements at times. Sister and brother in law are at bedside. Family meeting took place, see below.    Length of Stay: 6  Current Medications: Scheduled Meds:   chlorhexidine  15 mL Mouth Rinse BID   Chlorhexidine Gluconate Cloth  6 each Topical Daily   dexamethasone (DECADRON) injection  4 mg Intravenous Q12H   latanoprost  1 drop Both Eyes QHS   mouth rinse  15 mL Mouth Rinse q12n4p   metoprolol tartrate  2.5 mg Intravenous Q6H   pantoprazole (PROTONIX) IV  40 mg Intravenous Q24H   sodium chloride flush  10-40 mL Intracatheter Q12H    Continuous Infusions:  dextrose 100 mL/hr at 09/24/21 0600   levETIRAcetam Stopped (09/23/21 2201)   potassium chloride Stopped (09/24/21 1034)    PRN Meds: glycopyrrolate, ipratropium-albuterol, lip balm, LORazepam, morphine injection, nitroGLYCERIN, sodium chloride flush  Physical Exam         General: Largely somnolent, on O2.   Heart: Regular rate and rhythm. No murmur appreciated. Lungs: Decreased, some dyspnea and tachypnea evident.  Abdomen: Soft, nontender, nondistended, positive bowel sounds.   Ext: No significant edema Skin: Warm and dry  Vital  Signs: BP 110/67   Pulse 90   Temp 97.8 F (36.6 C) (Axillary)   Resp (!) 28   Ht 5\' 6"  (1.676 m)   Wt 63 kg   SpO2 100%   BMI 22.42 kg/m  SpO2: SpO2: 100 % O2 Device: O2 Device: Bag-valve Mask O2 Flow Rate: O2 Flow Rate (L/min): 15 L/min  Intake/output summary:  Intake/Output Summary (Last 24 hours) at 09/24/2021 1106 Last data filed at 09/24/2021 0800 Gross per 24 hour  Intake 1912.62 ml  Output 825 ml  Net 1087.62 ml  LBM: Last BM Date : 09/22/20 Baseline Weight: Weight: 63 kg Most recent weight: Weight: 63 kg       Palliative Assessment/Data:    Flowsheet Rows    Flowsheet Row Most Recent Value  Intake Tab   Referral Department Hospitalist  Unit at Time of Referral Med/Surg Unit  Palliative Care Primary Diagnosis Cancer  Date Notified 09/21/21  Palliative Care Type Return patient Palliative Care  Reason for referral Clarify Goals of Care  Date of Admission 09/18/21  Date first seen by Palliative Care 09/21/21  # of days Palliative referral response time 0 Day(s)  # of days IP prior to Palliative referral 3  Clinical Assessment   Palliative Performance Scale Score 10%  Psychosocial & Spiritual Assessment   Palliative Care Outcomes   Patient/Family meeting held? Yes  Who was at the meeting? Sister via phone       Patient Active Problem List   Diagnosis Date Noted   Acute deep vein thrombosis (DVT) of right peroneal vein (HCC) 09/21/2021   Hypernatremia 09/20/2021   Pressure injury of skin 09/19/2021   Abnormal LFTs 09/19/2021   Acute respiratory failure with hypoxia ([place]) 09/18/2021   Acute pulmonary embolism ([place]) 09/18/2021   UTI (urinary tract infection) 09/02/2021   Uncontrolled type 2 diabetes mellitus with hyperglycemia ([place]) 08/20/2021   Weakness 08/20/2021   At risk for falls 08/20/2021   Shingles rash 07/07/2021   Cognitive impairment 07/04/2021   Fall at home, initial encounter 07/03/2021   Left leg weakness 07/03/2021   Leukopenia  with mild neutropenia 07/02/2021   Anemia associated with chemotherapy 07/02/2021   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[place]) 04/01/2021   Port-A-Cath in place 12/14/2020   Brain metastases    Palliative care by specialist    Metastatic lung cancer (metastasis from lung to other site) [facility]) 10/16/2020   Glaucoma 10/16/2020   Atherosclerosis of aorta ([place]) 05/31/2020   Primary cancer of right upper lobe of lung ([place]) 02/07/2019   Lung nodule 02/02/2019   Hypertension associated with type 2 diabetes mellitus ([place]) 05/24/2018   Dyslipidemia 05/24/2018   Dyslipidemia associated with type 2 diabetes mellitus ([place]) 06/09/2017   Goals of care, counseling/discussion 01/01/2017    Palliative Care Assessment & Plan   Patient Profile: 76 y.o. 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  Palliative consulted for goals of care.  Recommendations/Plan: DNR/DNI No escalation of care. Comfort measures only.  Continue symptom management with morphine and Ativan for use as needed to ensure that he is not uncomfortable. Add Robinul.  Discontinue medications and interventions not directly contributing to the cause of comfort care at this point.  TOC consult for residential hospice. Notified TOC colleague in the ICU who will facilitate hospice arrangements.  Appreciate bedside nursing colleagues input and recommendations.  Patient's family has been notified of chaplain availability for additional support. Patient's own minister has also visited with him earlier on in this hospitalization.  Brief life review performed: patient is described as a very active individual who did not like to sit down. He was active and functional even after his cancer  diagnosis, for as long as he could. [name] is HCPOA, patient's other sister died about 2 years ago. He did not have a spouse, does not  have children. Sister at bedside has discussed with nieces and nephews who have deferred the decision making to her.     Code Status:    Code Status Orders  (From admission, onward)           Start     Ordered   09/22/21 1742  Do not attempt resuscitation (DNR)  Continuous       Question Answer Comment  In the event of cardiac or respiratory ARREST Do not call a "code blue"   In the event of cardiac or respiratory ARREST Do not perform Intubation, CPR, defibrillation or ACLS   In the event of cardiac or respiratory ARREST Use medication by any route, position, wound care, and other measures to relive pain and suffering. May use oxygen, suction and manual treatment of airway obstruction as needed for comfort.      09/22/21 1741           Code Status History     Date Active Date Inactive Code Status Order ID Comments User Context   09/19/2021 0038 09/22/2021 1741 Full Code [id number]  Orene Desanctis, DO Inpatient   09/02/2021 1527 09/10/2021 1809 Full Code [id number]  Jonnie Finner, DO Inpatient   07/02/2021 1627 07/15/2021 1655 Full Code [id number]  Antonieta Pert, MD ED   01/07/2021 1618 01/10/2021 2013 Full Code [id number]  Jonnie Finner, DO Inpatient   10/16/2020 1115 10/23/2020 2054 Full Code [id number]  Karmen Bongo, MD ED      Advance Directive Documentation    Flowsheet Row Most Recent Value  Type of Advance Directive [place]  [per previous hx]  Pre-existing out of facility DNR order (yellow form or pink MOST form) --  "MOST" Form in Place? --       Prognosis: Hours to days  Discharge Planning: Residential hospice.   Care plan was discussed with sister, brother in law, bedside RN and [facility]  Thank you for allowing the Palliative Medicine Team to assist in the care of this patient.   Time In: 10 Time Out: 10.50  Total Time 50 Prolonged Time Billed No   Loistine Chance, MD  Please contact Palliative Medicine Team phone at [phone] for questions and concerns.

## 2021-09-24 NOTE — Progress Notes (Signed)
Progress Note   Patient: Benjamin Willis. OEU:[id number] DOB: March 01, 1946 DOA: 09/18/2021     6 DOS: the patient was seen and examined on 09/24/2021   Brief hospital course: 76 y.o. 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   Unable to provide full history from patient while he is on BiPAP.  Reports increasing shortness of breath with cough for the past 2 days.   Last admitted from 5/1-09/10/21 with acute metabolic encephalopathy secondary to UTI and had physical deconditioning with weakness and falls and was discharged to SNF for rehab.    In the ED, he had temperature of 99.9 F, was tachycardic and was hypoxic down to 80% initially placed on nonrebreather and ultimately transitioned to BiPAP.  No leukocytosis, slight anemia with hemoglobin of 12.3.  Lactate of 2. CMP with CBG of 210 and elevated AST of 80, ALT of 46, alkaline phosphatase of 138. Troponin mildly elevated at 27. Negative flu and COVID PCR. CTA chest showing small segmental left upper lobe pulmonary emboli with minimal clot burden and no right heart strain.  There is bilateral interstitial and groundglass opacity favoring multifocal pneumonia.   Assessment and Plan: * Acute respiratory failure with hypoxia (HCC) Presented with hypoxia down to 80% requiring BiPAP secondary to pulmonary embolism and multifocal pneumonia - CTA chest showing small subsegmental left upper lobe pulmonary embolism with minimal clot.  No heart strain.  Bilateral interstitial groundglass opacity favoring multifocal pneumonia -Noted to have difficulty weaning off bipap -Serial CXR with findings suggesting worsening infiltrates -Patient was continued on cefepime with added azithromycin -2d echo reviewed, no evidence of heart strain. Normal LVEF -Despite broad spectrum abx and anticoagulation, patient's respiratory status did not improve and  patient failed to wean from BiPAP -Appreciate assistance by Palliative Care. Code Status later confirmed to be DNR/DNI. Patient was later transitioned to comfort care  Non-small cell lung cancer metastatic to brain [facility]) S/p whole brain radiation on chemotherapy -Followed by Dr. Lorenso Courier with oncology -Last chemo cycle 13 on 4/20 -Was continued on BID decadron and BID Keppra  Acute deep vein thrombosis (DVT) of right peroneal vein (HCC) -Acute R peroneal DVT on [place] -Patient was continued on therapeutic anticoagulation was  -No evidence of acute blood loss, hgb remained stable -Patient later transitioned to comfort care  Hypernatremia -clinically appeared dry on exam -Sodium ultimatley normalized with D5 fluids  Abnormal LFTs elevated AST of 80, ALT of 46, alkaline phosphatase of 138 at time of presentation -LFT's had remained stable this visit -Patient later transitioned to comfort care  Pressure injury of skin Stage II buttocks ulcer - WOC consulted this visit  Acute pulmonary embolism ([place]) -small PE noted on imaging -LE dopplers pos for acute DVT in R peroneal veins -Patient was continued on therapeutic anticoagulation -Patient later transitioned to comfort care  Uncontrolled type 2 diabetes mellitus with hyperglycemia ([place]) Recent hemoglobin A1c of 10.6 on 5/4 -Continued on sensitive SSI -Patient later transitioned to comfort care  Goals of care, counseling/discussion -Critical illness at presentation -Difficult weaning O2 in setting of known malignancy and new PE with worsening PNA -Pt was unable to wean from BiPAP --Patient later transitioned to comfort care      Subjective: Unable to assess this AM, pt lethargic  Physical Exam: Vitals:   09/24/21 1120 09/24/21 1200 09/24/21 1208 09/24/21 1500  BP: (!) 142/58 (!) 133/59    Pulse: 89 82  87  Resp: 16 16  16   Temp:   97.9 F (36.6 C)   TempSrc:   Axillary   SpO2: 98% 93%  94%  Weight:      Height:        General exam: Not conversant, in no acute distress Respiratory system: normal chest rise, clear, no audible wheezing Cardiovascular system: regular rhythm, s1-s2 Gastrointestinal system: Nondistended, nontender, pos BS Central nervous system: No seizures, no tremors Extremities: No cyanosis, no joint deformities Skin: No rashes, no pallor Psychiatry: Affect normal // no auditory hallucinations   Data Reviewed:  Labs reviewed: Na 139, Cr 1.17, hgb 8.6  Family Communication: Pt in room, Recently discussed with sister over phone  Disposition: Status is: Inpatient Remains inpatient appropriate because: Severity of illness  Planned Discharge Destination:  Residential Hospice     Author: Marylu Lund, MD 09/24/2021 6:22 PM  For on call review [email or web address].

## 2021-09-24 NOTE — Progress Notes (Signed)
OT Cancellation Note  Patient Details Name: Benjamin Willis. MRN: [id number] DOB: Mar 09, 1946   Cancelled Treatment:    Reason Eval/Treat Not Completed: Medical issues which prohibited therapy Patient remains on BiPAP at this time. Patient noted to have another meeting this AM with notes from 5/22 indicating comfort measures. OT to sign off at this time.  Jackelyn Poling OTR/L, [place] [facility] Office# [phone] Pager# [phone]   09/24/2021, 6:35 AM

## 2021-09-25 DIAGNOSIS — J9601 Acute respiratory failure with hypoxia: Secondary | ICD-10-CM | POA: Diagnosis not present

## 2021-09-25 DIAGNOSIS — I2699 Other pulmonary embolism without acute cor pulmonale: Secondary | ICD-10-CM | POA: Diagnosis not present

## 2021-09-25 DIAGNOSIS — C3490 Malignant neoplasm of unspecified part of unspecified bronchus or lung: Secondary | ICD-10-CM | POA: Diagnosis not present

## 2021-09-25 DIAGNOSIS — I82451 Acute embolism and thrombosis of right peroneal vein: Secondary | ICD-10-CM | POA: Diagnosis not present

## 2021-09-25 DIAGNOSIS — Z7189 Other specified counseling: Secondary | ICD-10-CM | POA: Diagnosis not present

## 2021-09-25 MED ORDER — POTASSIUM CHLORIDE 10 MEQ/100ML IV SOLN: 10.0000 meq | INTRAVENOUS | Status: DC

## 2021-09-25 NOTE — Progress Notes (Signed)
Daily Progress Note   Patient Name: Benjamin Willis.       Date: 09/25/2021 DOB: 1946/01/23  Age: 76 y.o. MRN#: [id number] Attending Physician: Oswald Hillock, MD Primary Care Physician: Horald Pollen, MD Admit Date: 09/18/2021  Reason for Consultation/Follow-up: Establishing goals of care  Subjective: I saw and examined Benjamin Willis today.  He remains on comfort care measures, appears comfortable, no distress.      Length of Stay: 7  Current Medications: Scheduled Meds:   chlorhexidine  15 mL Mouth Rinse BID   Chlorhexidine Gluconate Cloth  6 each Topical Daily   dexamethasone (DECADRON) injection  4 mg Intravenous Q12H   latanoprost  1 drop Both Eyes QHS   mouth rinse  15 mL Mouth Rinse q12n4p   metoprolol tartrate  2.5 mg Intravenous Q6H   pantoprazole (PROTONIX) IV  40 mg Intravenous Q24H   sodium chloride flush  10-40 mL Intracatheter Q12H    Continuous Infusions:  dextrose 10 mL/hr at 09/24/21 1702   levETIRAcetam Stopped (09/24/21 1202)    PRN Meds: glycopyrrolate, ipratropium-albuterol, lip balm, LORazepam, morphine injection, nitroGLYCERIN, sodium chloride flush  Physical Exam         General: Largely somnolent, on O2.   Heart: Regular rate and rhythm. No murmur appreciated. Lungs: Decreased, some dyspnea and tachypnea evident.  Abdomen: Soft, nontender, nondistended, positive bowel sounds.   Ext: No significant edema Skin: Warm and dry  Vital Signs: BP (!) 133/59   Pulse 87   Temp 97.9 F (36.6 C) (Axillary)   Resp 16   Ht 5\' 6"  (1.676 m)   Wt 63 kg   SpO2 94%   BMI 22.42 kg/m  SpO2: SpO2: 94 % O2 Device: O2 Device: High Flow Nasal Cannula O2 Flow Rate: O2 Flow Rate (L/min): 5 L/min  Intake/output summary:  Intake/Output Summary (Last 24  hours) at 09/25/2021 1040 Last data filed at 09/24/2021 1702 Gross per 24 hour  Intake 721.83 ml  Output 50 ml  Net 671.83 ml    LBM: Last BM Date : 09/22/20 Baseline Weight: Weight: 63 kg Most recent weight: Weight: 63 kg       Palliative Assessment/Data:    Flowsheet Rows    Flowsheet Row Most Recent Value  Intake Tab   Referral Department Hospitalist  Unit at Time  of Referral Med/Surg Unit  Palliative Care Primary Diagnosis Cancer  Date Notified 09/21/21  Palliative Care Type Return patient Palliative Care  Reason for referral Clarify Goals of Care  Date of Admission 09/18/21  Date first seen by Palliative Care 09/21/21  # of days Palliative referral response time 0 Day(s)  # of days IP prior to Palliative referral 3  Clinical Assessment   Palliative Performance Scale Score 10%  Psychosocial & Spiritual Assessment   Palliative Care Outcomes   Patient/Family meeting held? Yes  Who was at the meeting? Sister via phone       Patient Active Problem List   Diagnosis Date Noted   Acute deep vein thrombosis (DVT) of right peroneal vein (HCC) 09/21/2021   Hypernatremia 09/20/2021   Pressure injury of skin 09/19/2021   Abnormal LFTs 09/19/2021   Acute respiratory failure with hypoxia ([place]) 09/18/2021   Acute pulmonary embolism ([place]) 09/18/2021   UTI (urinary tract infection) 09/02/2021   Uncontrolled type 2 diabetes mellitus with hyperglycemia ([place]) 08/20/2021   Weakness 08/20/2021   At risk for falls 08/20/2021   Shingles rash 07/07/2021   Cognitive impairment 07/04/2021   Fall at home, initial encounter 07/03/2021   Left leg weakness 07/03/2021   Leukopenia with mild neutropenia 07/02/2021   Anemia associated with chemotherapy 07/02/2021   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   Port-A-Cath in place 12/14/2020   Brain metastases    Palliative care by specialist    Metastatic lung cancer (metastasis from lung to other site) [facility])  10/16/2020   Glaucoma 10/16/2020   Atherosclerosis of aorta ([place]) 05/31/2020   Primary cancer of right upper lobe of lung ([place]) 02/07/2019   Lung nodule 02/02/2019   Hypertension associated with type 2 diabetes mellitus ([place]) 05/24/2018   Dyslipidemia 05/24/2018   Dyslipidemia associated with type 2 diabetes mellitus ([place]) 06/09/2017   Goals of care, counseling/discussion 01/01/2017    Palliative Care Assessment & Plan   Patient Profile: 76 y.o. 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  Palliative consulted for goals of care.  Recommendations/Plan: DNR/DNI No escalation of care. Comfort measures only.  Continue symptom management with morphine and Ativan for use as needed to ensure that he is not uncomfortable. Robinul.  Awaiting bed at residential hospice.   Code Status:    Code Status Orders  (From admission, onward)           Start     Ordered   09/22/21 1742  Do not attempt resuscitation (DNR)  Continuous       Question Answer Comment  In the event of cardiac or respiratory ARREST Do not call a "code blue"   In the event of cardiac or respiratory ARREST Do not perform Intubation, CPR, defibrillation or ACLS   In the event of cardiac or respiratory ARREST Use medication by any route, position, wound care, and other measures to relive pain and suffering. May use oxygen, suction and manual treatment of airway obstruction as needed for comfort.      09/22/21 1741           Code Status History     Date Active Date Inactive Code Status Order ID Comments User Context   09/19/2021 0038 09/22/2021 1741 Full Code [id number]  Orene Desanctis, DO Inpatient   09/02/2021 1527 09/10/2021 1809 Full Code [id number]  Jonnie Finner, DO  Inpatient   07/02/2021 1627 07/15/2021 1655 Full Code [id number]  Antonieta Pert, MD ED   01/07/2021 1618 01/10/2021 2013 Full Code [id number]  Jonnie Finner, DO Inpatient   10/16/2020 1115 10/23/2020 2054 Full Code [id number]  Karmen Bongo, MD ED      Advance Directive Documentation    Flowsheet Row Most Recent Value  Type of Advance Directive [place]  [per previous hx]  Pre-existing out of facility DNR order (yellow form or pink MOST form) --  "MOST" Form in Place? --       Prognosis: Hours to days  Discharge Planning: Residential hospice.   Care plan was discussed with IDT  Thank you for allowing the Palliative Medicine Team to assist in the care of this patient.   Time In: 9 Time Out: 9.25 Total Time 25 Prolonged Time Billed No   Loistine Chance, MD  Please contact Palliative Medicine Team phone at [phone] for questions and concerns.

## 2021-09-25 NOTE — Progress Notes (Signed)
[facility]  PROGRESS NOTE  Benjamin Willis. 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  Patient was started on BiPAP.  He was last admitted from 09/02/2021 to 09/10/2021 with acute metabolic encephalopathy secondary to UTI and had physical deconditioning and weakness and falls and was discharged to skilled nursing facility for rehab.  He was brought back to the ED, in the ED he was found to be tachycardic and hypoxemic with O2 sats down to 80%, initially placed on nonrebreather and was transitioned to BiPAP.  No history of leukocytosis, slight anemia with hemoglobin of 12.3.  Lactate of 2. CT chest showed small segmental left upper lobe pulmonary emboli with minimal clot burden and no right heart strain.  Bilateral interstitial groundglass opacity favoring multifocal pneumonia.    Subjective   Patient seen and examined, lethargic this morning.   Assessment/Plan:    Acute respiratory failure with hypoxemia -Presented with hypoxemia, O2 sats down to 80% requiring BiPAP due to pulmonary embolism and multifocal pneumonia -Noted to have difficulty weaning of BiPAP -He was started on cefepime anthramycin -2D echo showed no evidence of right heart strain -Despite broad-spectrum antibiotics and anticoagulation, patient's respiratory status did not improve and patient failed to wean off from BiPAP -Palliative care was consulted, confirmed to be DNR/DNI -Later was transitioned to comfort measures only -Plan to transfer to residential hospice once bed becomes available  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 Lorenso Courier with oncology -Last chemo cycle was cycle 13 on 4/20 -Started on Decadron and Keppra  Acute  deep vein thrombosis of right peroneal vein -Found to have DVT of right peroneal vein on ultrasound -Initially started on anticoagulation -Now transition to comfort measures so anticoagulation has been stopped  Hypernatremia -Resolved  Hypokalemia -Potassium is 3.0 -Replace potassium with 10 mEq IV x3 -Will not check further labs as patient is comfort care only  Transaminitis -LFTs improved  Pressure injury of skin -Stage II buttocks ulcer -Wound care following  Acute pulmonary embolism -Small PE noted on imaging -Lower extremity Dopplers was positive for acute DVT in the right peroneal vein -Patient was initially started on therapeutic anticoagulation -Anticoagulation stopped after patient transition to comfort care only  Goals of care discussion -Patient was critically ill at presentation -There was difficulty weaning off oxygen in setting of known malignancy and new PE with worsening pneumonia -He was unable to be weaned off BiPAP -Palliative care was consulted -He was transitioned to comfort care only -Awaiting bed at residential hospice    Medications     chlorhexidine  15 mL Mouth Rinse BID   Chlorhexidine Gluconate Cloth  6 each Topical Daily   dexamethasone (DECADRON) injection  4 mg Intravenous Q12H   latanoprost  1 drop Both Eyes QHS   mouth rinse  15 mL Mouth Rinse q12n4p   metoprolol tartrate  2.5 mg Intravenous Q6H   pantoprazole (PROTONIX) IV  40 mg Intravenous Q24H   sodium chloride flush  10-40 mL Intracatheter Q12H     Data Reviewed:   CBG:  Recent Labs  Lab 09/23/21 0714 09/23/21 1147 09/23/21 1630 09/23/21 2128 09/24/21 0735  GLUCAP 287* 247* 238* 261* 259*    SpO2: 94 % O2 Flow Rate (L/min): 5 L/min FiO2 (%): 50 %  Vitals:   09/24/21 1120 09/24/21 1200 09/24/21 1208 09/24/21 1500  BP: (!) 142/58 (!) 133/59    Pulse: 89 82  87  Resp: 16 16  16   Temp:   97.9 F (36.6 C)   TempSrc:   Axillary   SpO2: 98% 93%  94%  Weight:       Height:          Data Reviewed:  Basic Metabolic Panel: Recent Labs  Lab 09/19/21 0153 09/20/21 0421 09/21/21 0300 09/22/21 0436 09/23/21 0516 09/24/21 0516  NA 141 146* 146* 149* 142 139  K 3.8 2.9* 3.3* 2.9* 3.4* 3.0*  CL 106 111 113* 116* 109 108  CO2 23 24 25 24 23 24   GLUCOSE 170* 124* 168* 152* 299* 258*  BUN 13 15 18 18  24* 31*  CREATININE 0.61 0.74 0.68 0.68 0.89 1.17  CALCIUM 8.6* 9.0 8.9 9.1 8.6* 8.7*  MG 1.9  --   --  2.3  --   --     CBC: Recent Labs  Lab 09/18/21 1600 09/19/21 1720 09/20/21 0421 09/21/21 0300 09/22/21 0436 09/24/21 0516  WBC 7.8 6.1 7.3 7.8 8.1 10.6*  NEUTROABS 7.4  --   --   --   --   --   HGB 12.3* 10.4* 10.2* 10.2* 10.3* 8.6*  HCT 38.1* 31.3* 31.6* 30.9* 32.3* 25.6*  MCV 100.3* 98.4 99.7 98.4 99.4 98.8  PLT 163 171 171 215 220 206    LFT Recent Labs  Lab 09/20/21 0421 09/21/21 0300 09/22/21 0436 09/23/21 0516 09/24/21 0516  AST 100* 104* 108* 97* 87*  ALT 35 32 31 27 25   ALKPHOS 151* 172* 199* 199* 193*  BILITOT 1.1 1.0 0.9 0.8 0.5  PROT 5.9* 5.8* 5.7* 5.3* 5.1*  ALBUMIN 2.0* 1.9* 1.9* 1.7* 1.6*     Antibiotics: Anti-infectives (From admission, onward)    Start     Dose/Rate Route Frequency Ordered Stop   09/24/21 1000  ceFEPIme (MAXIPIME) 2 g in sodium chloride 0.9 % 100 mL IVPB  Status:  Discontinued        2 g 200 mL/hr over 30 Minutes Intravenous Every 12 hours 09/24/21 0652 09/24/21 1106   09/20/21 1100  azithromycin (ZITHROMAX) 500 mg in sodium chloride 0.9 % 250 mL IVPB        500 mg 250 mL/hr over 60 Minutes Intravenous Every 24 hours 09/20/21 0951 09/23/21 1135   09/19/21 0400  vancomycin (VANCOREADY) IVPB 750 mg/150 mL  Status:  Discontinued        750 mg 150 mL/hr over 60 Minutes Intravenous Every 12 hours 09/18/21 2324 09/19/21 0825   09/19/21 0015  ceFEPIme (MAXIPIME) 2 g in sodium chloride 0.9 % 100 mL IVPB  Status:  Discontinued        2 g 200 mL/hr over 30 Minutes Intravenous Every 8  hours 09/18/21 2324 09/24/21 0652   09/18/21 1615  vancomycin (VANCOCIN) IVPB 1000 mg/200 mL premix        1,000 mg 200 mL/hr over 60 Minutes Intravenous  Once 09/18/21 1611 09/18/21 1740   09/18/21 1615  ceFEPIme (MAXIPIME) 2 g in sodium chloride 0.9 % 100 mL IVPB        2 g 200 mL/hr over 30 Minutes Intravenous  Once 09/18/21 1611 09/18/21 1721   09/18/21 1615  azithromycin (ZITHROMAX) 500 mg in sodium chloride 0.9 % 250 mL IVPB        500 mg 250 mL/hr over 60 Minutes Intravenous  Once  09/18/21 1611 09/18/21 1807        DVT prophylaxis: Patient full comfort care  Code Status: DNR  Family Communication: No family at bedside   CONSULTS palliative care   Objective    Physical Examination:   General: Appears lethargic Cardiovascular: S1-S2, regular Respiratory: Clear to auscultation bilaterally Abdomen: Abdomen is soft, nontender, no organomegaly Extremities: No edema in the lower extremities Neurologic: Lethargic, moving all extremities   Status is: Inpatient: Awaiting residential hospice placement    Pressure Injury 09/18/21 Sacrum Lower;Mid Stage 3 -  Full thickness tissue loss. Subcutaneous fat may be visible but bone, tendon or muscle are NOT exposed. red, painful (Active)  09/18/21 2304  Location: Sacrum  Location Orientation: Lower;Mid  Staging: Stage 3 -  Full thickness tissue loss. Subcutaneous fat may be visible but bone, tendon or muscle are NOT exposed.  Wound Description (Comments): red, painful  Present on Admission: Yes     Pressure Injury 09/18/21 Buttocks Right;Mid Stage 2 -  Partial thickness loss of dermis presenting as a shallow open injury with a red, pink wound bed without slough. small, round, red (Active)  09/18/21 2304  Location: Buttocks  Location Orientation: Right;Mid  Staging: Stage 2 -  Partial thickness loss of dermis presenting as a shallow open injury with a red, pink wound bed without slough.  Wound Description (Comments): small,  round, red  Present on Admission: Yes     Pressure Injury 09/24/21 Heel Right Deep Tissue Pressure Injury - Purple or maroon localized area of discolored intact skin or blood-filled blister due to damage of underlying soft tissue from pressure and/or shear. Circular, Dark Purple wound covering h (Active)  09/24/21 0259  Location: Heel  Location Orientation: Right  Staging: Deep Tissue Pressure Injury - Purple or maroon localized area of discolored intact skin or blood-filled blister due to damage of underlying soft tissue from pressure and/or shear.  Wound Description (Comments): Circular, Dark Purple wound covering heel  Present on Admission: No        [place]   [facility] If 7PM-7AM, please contact night-coverage at [email or web address], Office  [phone]   09/25/2021, 11:45 AM  LOS: 7 days

## 2021-09-25 NOTE — Progress Notes (Signed)
[facility]   I have received referral for hospice services to be conitnued at the [facility] in [place]. I have reviewed chart and discussed with our MD> She has approved the pt for admission to services.  We do have a bed to offer today if family in agreement. I have been unable to make contact with the pt's sister Jonn Shingles to confirm her interest and discuss hospice care.   After meeting or discussing with her I will update TOC team so that we will knwo how to move forward.   Webb Silversmith RN [phone]

## 2021-09-25 NOTE — Discharge Summary (Signed)
Physician Discharge Summary   Patient: Benjamin Willis. MRN: [id number] DOB: Jun 12, 1945  Admit date:     09/18/2021  Discharge date: 09/25/21  Discharge Physician: Oswald Hillock   PCP: Horald Pollen, MD   Recommendations at discharge:    To be discharged to [facility]  Discharge Diagnoses: Principal Problem:   Acute respiratory failure with hypoxia ([place]) Active Problems:   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[place])   Goals of care, counseling/discussion   Uncontrolled type 2 diabetes mellitus with hyperglycemia ([place])   Acute pulmonary embolism (HCC)   Pressure injury of skin   Abnormal LFTs   Hypernatremia   Acute deep vein thrombosis (DVT) of right peroneal vein (HCC)  Resolved Problems:   * No resolved hospital problems. *  Hospital Course: 76 y.o. 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   Unable to provide full history from patient while he is on BiPAP.  Reports increasing shortness of breath with cough for the past 2 days.   Last admitted from 5/1-09/10/21 with acute metabolic encephalopathy secondary to UTI and had physical deconditioning with weakness and falls and was discharged to SNF for rehab.    In the ED, he had temperature of 99.9 F, was tachycardic and was hypoxic down to 80% initially placed on nonrebreather and ultimately transitioned to BiPAP.  No leukocytosis, slight anemia with hemoglobin of 12.3.  Lactate of 2. CMP with CBG of 210 and elevated AST of 80, ALT of 46, alkaline phosphatase of 138. Troponin mildly elevated at 27. Negative flu and COVID PCR. CTA chest showing small segmental left upper lobe pulmonary emboli with minimal clot burden and no right heart strain.  There is bilateral interstitial and groundglass opacity favoring multifocal pneumonia.   Assessment and Plan:    Acute respiratory failure with  hypoxemia -Presented with hypoxemia, O2 sats down to 80% requiring BiPAP due to pulmonary embolism and multifocal pneumonia -Noted to have difficulty weaning of BiPAP -He was started on cefepime anthramycin -2D echo showed no evidence of right heart strain -Despite broad-spectrum antibiotics and anticoagulation, patient's respiratory status did not improve and patient failed to wean off from BiPAP -Palliative care was consulted, confirmed to be DNR/DNI -Later was transitioned to comfort measures only -Plan to transfer to residential hospice once bed becomes available   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 Lorenso Courier with oncology -Last chemo cycle was cycle 13 on 4/20 -Started on Decadron and Keppra   Acute deep vein thrombosis of right peroneal vein -Found to have DVT of right peroneal vein on ultrasound -Initially started on anticoagulation -Now transition to comfort measures so anticoagulation has been stopped   Hypernatremia -Resolved   Hypokalemia -Potassium is 3.0 -Replace potassium with 10 mEq IV x3 -Will not check further labs as patient is comfort care only   Transaminitis -LFTs improved   Pressure injury of skin -Stage II buttocks ulcer -Wound care following   Acute pulmonary embolism -Small PE noted on imaging -Lower extremity Dopplers was positive for acute DVT in the right peroneal vein -Patient was initially started on therapeutic anticoagulation -Anticoagulation stopped after patient transition to comfort care only   Goals of care discussion -Patient was critically ill at presentation -There was difficulty weaning off oxygen in setting of known malignancy and new PE with worsening pneumonia -He was unable to  be weaned off BiPAP -Palliative care was consulted -He was transitioned to comfort care only -To be transferred to residential hospice        Consultants: palliative  care Procedures performed:  Disposition: Hospice care Diet recommendation:  Discharge Diet Orders (From admission, onward)     Start     Ordered   09/25/21 0000  Diet - low sodium heart healthy        09/25/21 1731           Regular diet DISCHARGE MEDICATION: Allergies as of 09/25/2021   No Known Allergies      Medication List     STOP taking these medications    blood glucose meter kit and supplies Kit   Blood Pressure Monitor/Arm Devi   calcium carbonate 500 MG chewable tablet Commonly known as: TUMS - dosed in mg elemental calcium   cefTRIAXone 1 g injection Commonly known as: ROCEPHIN   dexamethasone 4 MG tablet Commonly known as: DECADRON   diphenhydrAMINE-zinc acetate cream Commonly known as: BENADRYL   empagliflozin 10 MG Tabs tablet Commonly known as: Jardiance   folic acid 1 MG tablet Commonly known as: FOLVITE   gabapentin 100 MG capsule Commonly known as: NEURONTIN   guaiFENesin 600 MG 12 hr tablet Commonly known as: MUCINEX   ipratropium-albuterol 0.5-2.5 (3) MG/3ML Soln Commonly known as: DUONEB   latanoprost 0.005 % ophthalmic solution Commonly known as: XALATAN   metFORMIN 500 MG tablet Commonly known as: GLUCOPHAGE   metoprolol tartrate 25 MG tablet Commonly known as: LOPRESSOR   multivitamin Tabs tablet   Nutritional Supplement Liqd   pantoprazole 40 MG tablet Commonly known as: PROTONIX   rosuvastatin 20 MG tablet Commonly known as: CRESTOR   sodium chloride 0.9 % infusion   tirzepatide 5 MG/0.5ML Pen Commonly known as: MOUNJARO       TAKE these medications    albuterol 108 (90 Base) MCG/ACT inhaler Commonly known as: VENTOLIN HFA Inhale 2 puffs into the lungs every 6 (six) hours as needed for wheezing or shortness of breath.   levETIRAcetam 1000 MG tablet Commonly known as: KEPPRA Take 1 tablet by mouth twice daily        Discharge Exam: Filed Weights   09/18/21 2300  Weight: 63 kg      Condition at discharge: fair  The results of significant diagnostics from this hospitalization (including imaging, microbiology, ancillary and laboratory) are listed below for reference.   Imaging Studies: CT HEAD WO CONTRAST (5MM)  Result Date: 09/04/2021 CLINICAL DATA:  Acute neuro deficit. Stroke suspected. Weakness. 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 EXAM: CT HEAD WITHOUT CONTRAST TECHNIQUE: Contiguous axial images were obtained from the base of the skull through the vertex without intravenous contrast. RADIATION DOSE REDUCTION: This exam was performed according to the departmental dose-optimization program which includes automated exposure control, adjustment of the mA and/or kV according to patient size and/or use of iterative reconstruction technique. COMPARISON:  CT brain 09/02/2021 and 07/02/2021; MRI brain 08/28/2021 FINDINGS: Brain: The ventricles are normal in size and configuration. The basilar cisterns are patent. No significant change in the treated metastatic lesions within the posterior right frontal lobe, medial right parieto-occipital lobe, left occipital lobe, and posterior right cerebellar hemisphere. No acute intracranial hemorrhage is seen. No abnormal extra-axial fluid collection. There is again moderate periventricular and subcortical white matter patchy hypodensity, likely a combination of chronic ischemic white matter changes and posttreatment changes. Vascular: No hyperdense vessel or unexpected calcification. Skull: Normal. Negative  for fracture or focal lesion. Sinuses/Orbits: The visualized orbits are unremarkable. Moderate mucosal thickening within the ethmoid air cells is similar to prior. Opacification of the majority of the left mastoid air cells. Opacification of the left middle ear. Old right orbital fractures with depression of the right lamina papyracea. Other: None. IMPRESSION:: IMPRESSION: 1. No significant change from 09/02/2021. 2.  Multiple treated metastatic brain lesions. 3. No acute intracranial hemorrhage. No acute intracranial process is seen. Electronically Signed   By: Yvonne Kendall M.D.   On: 09/04/2021 11:48   CT Head Wo Contrast  Result Date: 09/02/2021 CLINICAL DATA:  Generalized weakness. Mental status change. 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 EXAM: CT HEAD WITHOUT CONTRAST TECHNIQUE: Contiguous axial images were obtained from the base of the skull through the vertex without intravenous contrast. RADIATION DOSE REDUCTION: This exam was performed according to the departmental dose-optimization program which includes automated exposure control, adjustment of the mA and/or kV according to patient size and/or use of iterative reconstruction technique. COMPARISON:  CT 07/02/2021.  MRI 08/28/2021. FINDINGS: Brain: No evidence of new or progressive disease. Low-density throughout the white matter consistent with post treatment change. Some vasogenic edema at the right parietal vertex relates to a treated lesion in that region. Treated lesions can be identified by CT in the posterior right cerebellum, left occipital lobe, medial right posterior parietal region, and right parietal vertex. None of these appear to have enlarged. No evidence of hydrocephalus or extra-axial collection. Vascular: There is atherosclerotic calcification of the major vessels at the base of the brain. Skull: No calvarial lesion. Sinuses/Orbits: No significant sinus inflammatory disease. Fluid in the left middle ear and mastoid regions. Old right medial orbital blowout fracture. Other: None IMPRESSION: No new, progressive or worsening findings. Treated lesions affecting the cerebellum and cerebral hemispheres as above without evidence of progression or change. Chronic white matter low density consistent with post treatment change. Some vasogenic edema in the right parietal region related to the treated lesion as seen previously.  Electronically Signed   By: Nelson Chimes M.D.   On: 09/02/2021 11:34   CT Angio Chest PE W and/or Wo Contrast  Result Date: 09/18/2021 CLINICAL DATA:  Short of breath, altered level of consciousness EXAM: CT ANGIOGRAPHY CHEST WITH CONTRAST TECHNIQUE: Multidetector CT imaging of the chest was performed using the standard protocol during bolus administration of intravenous contrast. Multiplanar CT image reconstructions and MIPs were obtained to evaluate the vascular anatomy. RADIATION DOSE REDUCTION: This exam was performed according to the departmental dose-optimization program which includes automated exposure control, adjustment of the mA and/or kV according to patient size and/or use of iterative reconstruction technique. CONTRAST:  20mL OMNIPAQUE IOHEXOL 350 MG/ML SOLN COMPARISON:  06/17/2021, 09/18/2021 FINDINGS: Cardiovascular: This is a technically adequate evaluation of the pulmonary vasculature. Nonocclusive pulmonary emboli are seen within the left upper lobe segmental branches. Minimal clot burden. No other filling defects. The heart is unremarkable without pericardial effusion. No evidence of thoracic aortic aneurysm or dissection. Stable atherosclerosis. Mediastinum/Nodes: Borderline enlarged mediastinal adenopathy measuring up to 9 mm in the right paratracheal region, likely reactive. There is circumferential wall thickening of the distal thoracic esophagus, which could reflect esophagitis. Thyroid and trachea are unremarkable. Lungs/Pleura: Interval development of bilateral interstitial and ground-glass opacities, favor multifocal pneumonia over edema. No effusion or pneumothorax. Chronic post therapeutic changes within the right upper lobe related to prior radiation therapy for lung cancer. Central airways are patent. Upper Abdomen: No acute abnormality. Stable nonspecific 11  mm right adrenal nodule, previously characterized as adenoma. Musculoskeletal: No acute or destructive bony lesions.  Reconstructed images demonstrate no additional findings. Review of the MIP images confirms the above findings. IMPRESSION: 1. Small segmental left upper lobe pulmonary emboli. Minimal clot burden. No right heart strain. 2. Widespread bilateral interstitial and ground-glass airspace opacities, favor multifocal pneumonia over asymmetric edema. 3. Interval development of borderline enlarged mediastinal adenopathy, likely reactive. 4. Mild circumferential wall thickening of the distal thoracic esophagus, new since prior study. Favor mild esophagitis. 5.  Aortic Atherosclerosis (ICD10-I70.0). Critical Value/emergent results were called by telephone at the time of interpretation on 09/18/2021 at 8:36 pm to provider Wynona Dove , who verbally acknowledged these results. Electronically Signed   By: Randa Ngo M.D.   On: 09/18/2021 20:41   MR Brain W Wo Contrast  Result Date: 08/29/2021 CLINICAL DATA:  76 year old male with a history of metastatic lung cancer status post whole brain radiation last June. Progression of 4 treated lesions in February. Restaging. EXAM: MRI HEAD WITHOUT AND WITH CONTRAST TECHNIQUE: Multiplanar, multiecho pulse sequences of the brain and surrounding structures were obtained without and with intravenous contrast. CONTRAST:  92mL GADAVIST GADOBUTROL 1 MMOL/ML IV SOLN COMPARISON:  Brain MRI 07/02/2021 and earlier. FINDINGS: Brain: Black blood postcontrast imaging today. Mixed cystic and nodular enhancing metastasis in the right perirolandic cortex near the upper extremity representation area now is roughly 19 x 24 mm but slightly decreased since February (22 x 27 mm at that time). Regional T2 and FLAIR hyperintense edema appears regressed but not resolved (series 4, image 40). Medial right parietooccipital sulcus region mostly solid enhancing metastasis is now 16 x 21 mm and stable to slightly larger since February (series 1100 image 179). Regional T2 and FLAIR hyperintense edema here also  appears regressed but not resolved. Smaller left occipital pole oval metastasis is now 14 mm and not significantly changed on series 1100, image 144. Surrounding edema there has regressed. Similar size posterior right superior cerebellar metastasis near the tentorium is now 15 mm and not significantly changed on series 1100, image 117 and series 10, image 24. Surrounding edema has largely resolved. All lesions demonstrate some hemosiderin. No new metastasis or other abnormal enhancement identified. No superimposed restricted diffusion to suggest acute infarction. No midline shift, ventriculomegaly, extra-axial collection or acute intracranial hemorrhage. Cervicomedullary junction and pituitary are within normal limits. Background post whole brain radiation white matter T2 and FLAIR hyperintensity is stable. Mild generalized cerebral volume loss since June of last year. Vascular: Major intracranial vascular flow voids are stable, dominant right vertebral artery. Skull and upper cervical spine: Visualized bone marrow signal is within normal limits. Negative visible cervical spine and spinal cord. Sinuses/Orbits: Mild paranasal sinus mucosal thickening now. Orbits appear stable and negative. Other: Mild new mastoid effusions greater on the left. Otherwise grossly normal visible internal auditory structures. IMPRESSION: 1. No progression of disease. Right perirolandic metastasis is slightly smaller since February (now up to 2.4 cm long axis versus 2.7 cm previously). And the 3 remaining metastases have not significantly changed in size. Vasogenic edema at all four sites appears regressed. 2. No new metastatic disease or new intracranial abnormality identified. Electronically Signed   By: Genevie Ann M.D.   On: 08/29/2021 08:07   DG CHEST PORT 1 VIEW  Result Date: 09/21/2021 CLINICAL DATA:  Difficulty breathing EXAM: PORTABLE CHEST 1 VIEW COMPARISON:  Previous studies including the examination of 09/20/2021 FINDINGS:  Cardiac size is within normal limits. There are patchy alveolar  and interstitial densities in the left parahilar region with interval worsening. Increased interstitial markings are seen in the right lung with possible slight improvement in right lower lung fields. Right lateral CP angle is indistinct. There is no pneumothorax. Tip of right IJ chest port is seen in the superior vena cava. IMPRESSION: There is interval worsening aeration in the left parahilar region and improvement in aeration in the right lower lung fields. Increased interstitial and alveolar markings in both lungs suggest pulmonary edema or multifocal pneumonia. Possible small right pleural effusion. Electronically Signed   By: Elmer Picker M.D.   On: 09/21/2021 12:05   DG CHEST PORT 1 VIEW  Result Date: 09/20/2021 CLINICAL DATA:  Hypoxia. History of non-small cell lung cancer with brain metastases. EXAM: PORTABLE CHEST 1 VIEW COMPARISON:  09/18/2021 FINDINGS: Right IJ Port-A-Cath unchanged. Patient is rotated to the right. Lungs are adequately inflated demonstrate slight interval worsening of moderate bilateral diffuse hazy airspace opacification most prominent over the perihilar regions as findings may be due to interstitial edema versus infection and less likely lymphangitic spread of patient's known lung cancer. No definite effusion. Cardiomediastinal silhouette and remainder of the exam is unchanged. IMPRESSION: Slight interval worsening of moderate bilateral diffuse hazy airspace process which may be due to interstitial edema versus infection and less likely lymphangitic spread of patient's known lung cancer. Electronically Signed   By: Marin Olp M.D.   On: 09/20/2021 09:25   DG Chest Port 1 View  Result Date: 09/18/2021 CLINICAL DATA:  Altered mental status EXAM: PORTABLE CHEST 1 VIEW COMPARISON:  09/02/2021 FINDINGS: Right Port-A-Cath remains in place, unchanged. Heart is normal size. Bilateral airspace disease, right  greater than left. No effusions or acute bony abnormality. IMPRESSION: Bilateral airspace disease, asymmetric to the right. Favor infection although asymmetric edema could have this appearance. Electronically Signed   By: Rolm Baptise M.D.   On: 09/18/2021 16:21   DG Chest Port 1 View  Result Date: 09/02/2021 CLINICAL DATA:  Provided history: Confusion.  Generalized weakness. EXAM: PORTABLE CHEST 1 VIEW COMPARISON:  CT chest/abdomen/pelvis 06/17/2021. Prior chest radiographs 01/07/2021 and earlier. FINDINGS: A right chest infusion port catheter is present with tip projecting at the level of the superior cavoatrial junction. Heart size within normal limits. Ill-defined opacity within the right upper lobe corresponding with the post radiation changes described on the prior chest CT of 06/17/2021. No appreciable airspace consolidation within the left lung. No evidence of pleural effusion or pneumothorax. No acute bony abnormality identified. Thoracic dextrocurvature. IMPRESSION: No evidence of acute cardiopulmonary abnormality. Ill-defined opacity within the right upper lobe corresponding with the post radiation changes described on the prior chest CT of 06/17/2021. Electronically Signed   By: Kellie Simmering D.O.   On: 09/02/2021 10:47   ECHOCARDIOGRAM COMPLETE  Result Date: 09/20/2021    ECHOCARDIOGRAM REPORT   Patient Name:   Benjamin Willis. Date of Exam: 09/20/2021 Medical Rec #:  [id number]         Height:       66.0 in Accession #:    [id number]        Weight:       138.9 lb Date of Birth:  [birth year]         BSA:          1.713 m Patient Age:    41 years          BP:           115/83 mmHg Patient Gender: M  HR:           83 bpm. Exam Location:  Inpatient Procedure: 2D Echo, 3D Echo, Cardiac Doppler and Color Doppler Indications:    I26.02 Pulmonary embolus  History:        Patient has no prior history of Echocardiogram examinations.                 Signs/Symptoms:Shortness of Breath, Dyspnea  and Altered Mental                 Status; Risk Factors:Diabetes and Dyslipidemia. Metastatic lung                 cancer. Pulmonary embolus.  Sonographer:    Roseanna Rainbow RDCS Referring Phys: [place]  Sonographer Comments: Technically difficult study due to poor echo windows. Image acquisition challenging due to uncooperative patient. Patient moaning and verbalizing throughout exam. Patient could not follow directions. IMPRESSIONS  1. Abnormal septal motion . Left ventricular ejection fraction, by estimation, is 50 to 55%. The left ventricle has low normal function. The left ventricle has no regional wall motion abnormalities. There is mild asymmetric left ventricular hypertrophy of the basal and septal segments. Left ventricular diastolic parameters are consistent with Grade I diastolic dysfunction (impaired relaxation).  2. Right ventricular systolic function is normal. The right ventricular size is normal. There is normal pulmonary artery systolic pressure.  3. The pericardial effusion is posterior to the left ventricle.  4. The mitral valve is normal in structure. No evidence of mitral valve regurgitation. No evidence of mitral stenosis.  5. The aortic valve is tricuspid. There is mild calcification of the aortic valve. There is mild thickening of the aortic valve. Aortic valve regurgitation is trivial. Aortic valve sclerosis is present, with no evidence of aortic valve stenosis.  6. The inferior vena cava is normal in size with greater than 50% respiratory variability, suggesting right atrial pressure of 3 mmHg. FINDINGS  Left Ventricle: Abnormal septal motion. Left ventricular ejection fraction, by estimation, is 50 to 55%. The left ventricle has low normal function. The left ventricle has no regional wall motion abnormalities. The left ventricular internal cavity size was normal in size. There is mild asymmetric left ventricular hypertrophy of the basal and septal segments. Left ventricular  diastolic parameters are consistent with Grade I diastolic dysfunction (impaired relaxation). Right Ventricle: The right ventricular size is normal. No increase in right ventricular wall thickness. Right ventricular systolic function is normal. There is normal pulmonary artery systolic pressure. The tricuspid regurgitant velocity is 1.91 m/s, and  with an assumed right atrial pressure of 3 mmHg, the estimated right ventricular systolic pressure is 43.1 mmHg. Left Atrium: Left atrial size was normal in size. Right Atrium: Right atrial size was normal in size. Pericardium: Trivial pericardial effusion is present. The pericardial effusion is posterior to the left ventricle. Mitral Valve: The mitral valve is normal in structure. No evidence of mitral valve regurgitation. No evidence of mitral valve stenosis. Tricuspid Valve: The tricuspid valve is normal in structure. Tricuspid valve regurgitation is mild . No evidence of tricuspid stenosis. Aortic Valve: The aortic valve is tricuspid. There is mild calcification of the aortic valve. There is mild thickening of the aortic valve. Aortic valve regurgitation is trivial. Aortic valve sclerosis is present, with no evidence of aortic valve stenosis. Pulmonic Valve: The pulmonic valve was normal in structure. Pulmonic valve regurgitation is not visualized. No evidence of pulmonic stenosis. Aorta: The aortic root is normal in  size and structure. Venous: The inferior vena cava is normal in size with greater than 50% respiratory variability, suggesting right atrial pressure of 3 mmHg. IAS/Shunts: No atrial level shunt detected by color flow Doppler.  LEFT VENTRICLE PLAX 2D LVIDd:         5.10 cm     Diastology LVIDs:         3.70 cm     LV e' medial:    6.09 cm/s LV PW:         1.20 cm     LV E/e' medial:  8.1 LV IVS:        0.90 cm     LV e' lateral:   4.13 cm/s LVOT diam:     2.20 cm     LV E/e' lateral: 11.9 LV SV:         60 LV SV Index:   35 LVOT Area:     3.80 cm                              3D Volume EF: LV Volumes (MOD)           3D EF:        52 % LV vol d, MOD A2C: 65.3 ml LV EDV:       92 ml LV vol d, MOD A4C: 62.4 ml LV ESV:       44 ml LV vol s, MOD A2C: 28.1 ml LV SV:        48 ml LV vol s, MOD A4C: 31.3 ml LV SV MOD A2C:     37.2 ml LV SV MOD A4C:     62.4 ml LV SV MOD BP:      34.0 ml RIGHT VENTRICLE             IVC RV S prime:     15.60 cm/s  IVC diam: 1.60 cm TAPSE (M-mode): 2.4 cm LEFT ATRIUM           Index        RIGHT ATRIUM           Index LA diam:      2.80 cm 1.63 cm/m   RA Area:     12.40 cm LA Vol (A2C): 14.2 ml 8.29 ml/m   RA Volume:   29.00 ml  16.93 ml/m LA Vol (A4C): 20.1 ml 11.74 ml/m  AORTIC VALVE LVOT Vmax:   77.10 cm/s LVOT Vmean:  51.800 cm/s LVOT VTI:    0.158 m  AORTA Ao Root diam: 3.00 cm Ao Asc diam:  3.20 cm MITRAL VALVE               TRICUSPID VALVE MV Area (PHT): 4.39 cm    TR Peak grad:   14.6 mmHg MV Decel Time: 173 msec    TR Vmax:        191.00 cm/s MV E velocity: 49.30 cm/s MV A velocity: 83.60 cm/s  SHUNTS MV E/A ratio:  0.59        Systemic VTI:  0.16 m                            Systemic Diam: 2.20 cm Jenkins Rouge MD Electronically signed by Jenkins Rouge MD Signature Date/Time: 09/20/2021/5:58:17 PM    Final    VAS [place] LOWER EXTREMITY VENOUS (DVT)  Result Date: 09/20/2021  Lower Venous  DVT Study Patient Name:  Benjamin Willis.  Date of Exam:   09/19/2021 Medical Rec #: [id number]          Accession #:    [id number] Date of Birth: 1945-12-06          Patient Gender: M Patient Age:   21 years Exam Location:  [facility] Procedure:      VAS [place] LOWER EXTREMITY VENOUS (DVT) Referring Phys: STEPHEN CHIU --------------------------------------------------------------------------------  Indications: Pulmonary embolism.  Limitations: Contracted lower extremities. Comparison Study: No prior study Performing Technologist: Maudry Mayhew MHA, RDMS, RVT, RDCS  Examination Guidelines: A complete evaluation includes B-mode imaging,  spectral Doppler, color Doppler, and power Doppler as needed of all accessible portions of each vessel. Bilateral testing is considered an integral part of a complete examination. Limited examinations for reoccurring indications may be performed as noted. The reflux portion of the exam is performed with the patient in reverse Trendelenburg.  +---------+---------------+---------+-----------+----------+--------------+ RIGHT    CompressibilityPhasicitySpontaneityPropertiesThrombus Aging +---------+---------------+---------+-----------+----------+--------------+ CFV      Full           Yes      Yes                                 +---------+---------------+---------+-----------+----------+--------------+ SFJ      Full                                                        +---------+---------------+---------+-----------+----------+--------------+ FV Prox  Full                                                        +---------+---------------+---------+-----------+----------+--------------+ FV Mid   Full                                                        +---------+---------------+---------+-----------+----------+--------------+ FV DistalFull                                                        +---------+---------------+---------+-----------+----------+--------------+ PFV      Full                                                        +---------+---------------+---------+-----------+----------+--------------+ POP      Full           Yes      Yes                                 +---------+---------------+---------+-----------+----------+--------------+ PTV      None  No                   Acute          +---------+---------------+---------+-----------+----------+--------------+   Right Technical Findings: Not visualized segments include peroneal veins.  +---------+---------------+---------+-----------+----------+--------------+ LEFT      CompressibilityPhasicitySpontaneityPropertiesThrombus Aging +---------+---------------+---------+-----------+----------+--------------+ CFV      Full           Yes      Yes                                 +---------+---------------+---------+-----------+----------+--------------+ SFJ      Full                                                        +---------+---------------+---------+-----------+----------+--------------+ FV Prox  Full                                                        +---------+---------------+---------+-----------+----------+--------------+ FV Mid   Full                                                        +---------+---------------+---------+-----------+----------+--------------+ FV DistalFull                                                        +---------+---------------+---------+-----------+----------+--------------+ PFV      Full                                                        +---------+---------------+---------+-----------+----------+--------------+ POP      Full           Yes      Yes                                 +---------+---------------+---------+-----------+----------+--------------+ PTV      Full                                                        +---------+---------------+---------+-----------+----------+--------------+ PERO     Full                                                        +---------+---------------+---------+-----------+----------+--------------+  Summary: RIGHT: - Findings consistent with acute deep vein thrombosis involving the right peroneal veins. - No cystic structure found in the popliteal fossa.  LEFT: - There is no evidence of deep vein thrombosis in the lower extremity.  - No cystic structure found in the popliteal fossa.  *See table(s) above for measurements and observations. Electronically signed by Orlie Pollen on 09/20/2021 at 5:12:03 PM.    Final      Microbiology: Results for orders placed or performed during the hospital encounter of 09/18/21  Culture, blood (Routine x 2)     Status: None   Collection Time: 09/18/21  4:00 PM   Specimen: BLOOD  Result Value Ref Range Status   Specimen Description   Final    BLOOD LEFT ANTECUBITAL Performed at [facility], [place] [address], [place], [place] [address]    Special Requests   Final    BOTTLES DRAWN AEROBIC AND ANAEROBIC Blood Culture adequate volume Performed at [facility] [address], [place], [place] [address]    Culture   Final    NO GROWTH 5 DAYS Performed at [facility], [place] [address], [place], [place] [address]    Report Status 09/23/2021 FINAL  Final  Culture, blood (Routine x 2)     Status: None   Collection Time: 09/18/21  4:05 PM   Specimen: BLOOD  Result Value Ref Range Status   Specimen Description   Final    BLOOD BLOOD RIGHT FOREARM Performed at [facility] [address] [place], [place] [address]    Special Requests   Final    BOTTLES DRAWN AEROBIC AND ANAEROBIC Blood Culture results may not be optimal due to an inadequate volume of blood received in culture bottles Performed at [facility] [address] [place], [place] [address]    Culture   Final    NO GROWTH 5 DAYS Performed at [facility], [place] [address] [place], [place] [address]    Report Status 09/23/2021 FINAL  Final  Resp Panel by RT-PCR (Flu A&B, Covid) Nasopharyngeal Swab     Status: None   Collection Time: 09/18/21  4:10 PM   Specimen: Nasopharyngeal Swab; Nasopharyngeal(NP) swabs in vial transport medium  Result Value Ref Range Status   SARS Coronavirus 2 by RT PCR NEGATIVE NEGATIVE Final    Comment: (NOTE) SARS-CoV-2 target nucleic acids are NOT DETECTED.  The SARS-CoV-2 RNA is generally detectable in upper respiratory specimens during the acute phase of infection. The  lowest concentration of SARS-CoV-2 viral copies this assay can detect is 138 copies/mL. A negative result does not preclude SARS-Cov-2 infection and should not be used as the sole basis for treatment or other patient management decisions. A negative result may occur with  improper specimen collection/handling, submission of specimen other than nasopharyngeal swab, presence of viral mutation(s) within the areas targeted by this assay, and inadequate number of viral copies(<138 copies/mL). A negative result must be combined with clinical observations, patient history, and epidemiological information. The expected result is Negative.  Fact Sheet for Patients:  EntrepreneurPulse.com.au  Fact Sheet for Healthcare Providers:  IncredibleEmployment.be  This test is no t yet approved or cleared by the [place] FDA and  has been authorized for detection and/or diagnosis of SARS-CoV-2 by FDA under an Emergency Use Authorization (EUA). This EUA will remain  in effect (meaning this test can be used) for the duration of the COVID-19 declaration under Section 564(b)(1)  of the Act, 21 U.S.C.section 360bbb-3(b)(1), unless the authorization is terminated  or revoked sooner.       Influenza A by PCR NEGATIVE NEGATIVE Final   Influenza B by PCR NEGATIVE NEGATIVE Final    Comment: (NOTE) The Xpert Xpress SARS-CoV-2/FLU/RSV plus assay is intended as an aid in the diagnosis of influenza from Nasopharyngeal swab specimens and should not be used as a sole basis for treatment. Nasal washings and aspirates are unacceptable for Xpert Xpress SARS-CoV-2/FLU/RSV testing.  Fact Sheet for Patients: EntrepreneurPulse.com.au  Fact Sheet for Healthcare Providers: IncredibleEmployment.be  This test is not yet approved or cleared by the [place] FDA and has been authorized for detection and/or diagnosis of SARS-CoV-2 by FDA under  an Emergency Use Authorization (EUA). This EUA will remain in effect (meaning this test can be used) for the duration of the COVID-19 declaration under Section 564(b)(1) of the Act, 21 U.S.C. section 360bbb-3(b)(1), unless the authorization is terminated or revoked.  Performed at [facility], [place] [address], [place], [place] [address]   MRSA Next Gen by PCR, Nasal     Status: None   Collection Time: 09/18/21 10:36 PM   Specimen: Nasal Mucosa; Nasal Swab  Result Value Ref Range Status   MRSA by PCR Next Gen NOT DETECTED NOT DETECTED Final    Comment: (NOTE) The GeneXpert MRSA Assay (FDA approved for NASAL specimens only), is one component of a comprehensive MRSA colonization surveillance program. It is not intended to diagnose MRSA infection nor to guide or monitor treatment for MRSA infections. Test performance is not FDA approved in patients less than 28 years old. Performed at [facility], [place] [address], [place], [place] [address]   Culture, blood (Routine X 2) w Reflex to ID Panel     Status: None (Preliminary result)   Collection Time: 09/23/21  5:25 AM   Specimen: BLOOD  Result Value Ref Range Status   Specimen Description   Final    BLOOD BLOOD RIGHT HAND Performed at [facility] [address] [place], [place] [address]    Special Requests   Final    BOTTLES DRAWN AEROBIC AND ANAEROBIC Blood Culture adequate volume Performed at [facility] [address], [place], [place] [address]    Culture   Final    NO GROWTH 2 DAYS Performed at [facility] [address], [place], [place] [address]    Report Status PENDING  Incomplete  Culture, blood (Routine X 2) w Reflex to ID Panel     Status: None (Preliminary result)   Collection Time: 09/23/21  5:31 AM   Specimen: BLOOD  Result Value Ref Range Status   Specimen Description   Final    BLOOD BLOOD LEFT HAND Performed at [facility] [place] [address] [place], [place] [address]    Special Requests   Final    IN PEDIATRIC BOTTLE Blood Culture adequate volume Performed at [facility] [address] [place], [place] [address]    Culture   Final    NO GROWTH 2 DAYS Performed at [facility] [address] [place],  [address]    Report Status PENDING  Incomplete    Labs: CBC: Recent Labs  Lab 09/19/21 1720 09/20/21 0421 09/21/21 0300 09/22/21 0436 09/24/21 0516  WBC 6.1 7.3 7.8 8.1 10.6*  HGB 10.4* 10.2* 10.2* 10.3* 8.6*  HCT 31.3* 31.6* 30.9* 32.3* 25.6*  MCV  98.4 99.7 98.4 99.4 98.8  PLT 171 171 215 220 223   Basic Metabolic Panel: Recent Labs  Lab 09/19/21 0153 09/20/21 0421 09/21/21 0300 09/22/21 0436 09/23/21 0516 09/24/21 0516  NA 141 146* 146* 149* 142 139  K 3.8 2.9* 3.3* 2.9* 3.4* 3.0*  CL 106 111 113* 116* 109 108  CO2 $Re'23 24 25 24 23 24  'LWR$ GLUCOSE 170* 124* 168* 152* 299* 258*  BUN $Re'13 15 18 18 'abk$ 24* 31*  CREATININE 0.61 0.74 0.68 0.68 0.89 1.17  CALCIUM 8.6* 9.0 8.9 9.1 8.6* 8.7*  MG 1.9  --   --  2.3  --   --    Liver Function Tests: Recent Labs  Lab 09/20/21 0421 09/21/21 0300 09/22/21 0436 09/23/21 0516 09/24/21 0516  AST 100* 104* 108* 97* 87*  ALT 35 32 $Rem'31 27 25  'jXsq$ ALKPHOS 151* 172* 199* 199* 193*  BILITOT 1.1 1.0 0.9 0.8 0.5  PROT 5.9* 5.8* 5.7* 5.3* 5.1*  ALBUMIN 2.0* 1.9* 1.9* 1.7* 1.6*   CBG: Recent Labs  Lab 09/23/21 0714 09/23/21 1147 09/23/21 1630 09/23/21 2128 09/24/21 0735  GLUCAP 287* 247* 238* 261* 259*    Discharge time spent: greater than 30 minutes.  Signed: Oswald Hillock, MD [facility] 09/25/2021

## 2021-09-25 NOTE — TOC Transition Note (Signed)
Transition of Care [facility]) - CM/SW Discharge Note  Patient Details  Name: Benjamin Willis. MRN: [id number] Date of Birth: 1945-09-24  [facility] CM/SW Contact:  Sherie Don, LCSW Phone Number: 09/25/2021, 3:28 PM  Clinical Narrative: Sister has signed consents for [facility] confirmed there is a bed available today. Discharge packet completed. The number for report is [phone]. Medical necessity form done; PTAR scheduled. TOC signing off.  Final next level of care: [place] Barriers to Discharge: Barriers Resolved  Patient Goals and CMS Choice Patient states their goals for this hospitalization and ongoing recovery are:: Go to [facility] for residential hospice CMS Medicare.gov Compare Post Acute Care list provided to:: Patient Represenative (must comment) Choice offered to / list presented to : Sibling  Discharge Placement     Patient chooses bed at: Other - please specify in the comment section below: Dance movement psychotherapist) Patient to be transferred to facility by: [place] Name of family member notified: Jonn Shingles (sister) Patient and family notified of of transfer: 09/25/21  Discharge Plan and Services In-house Referral: Clinical Social Work Discharge Planning Services: CM Consult Post Acute Care Choice: Residential Hospice Bed          DME Arranged: N/A DME Agency: NA  Readmission Risk Interventions    09/24/2021    1:42 PM 09/04/2021    1:53 PM 07/05/2021    1:02 PM  Readmission Risk Prevention Plan  Transportation Screening Complete Complete Complete  PCP or Specialist Appt within 3-5 Days  Complete Complete  HRI or [place]  Complete Complete  Social Work Consult for [place] Planning/Counseling  Complete Complete  Palliative Care Screening  Not Applicable Complete  Medication Review Press photographer) Complete Complete Complete  PCP or Specialist appointment within 3-5 days of discharge Not Complete     PCP/Specialist Appt Not Complete comments Patient will discharge to residential hospice once a bed is available.    SW Recovery Care/Counseling Consult Complete    Palliative Care Screening Complete    Skilled Nursing Facility Not Applicable

## 2021-09-25 NOTE — Progress Notes (Addendum)
Report called to [place] at [place].  Awaiting PTAR transport.  Pt resting comfortably.  Justice Britain, RN 09/25/2021 4:38 PM

## 2021-09-28 LAB — CULTURE, BLOOD (ROUTINE X 2)
Culture: NO GROWTH
Culture: NO GROWTH
Special Requests: ADEQUATE
Special Requests: ADEQUATE

## 2021-10-03 DEATH — deceased

## 2021-10-11 ENCOUNTER — Ambulatory Visit: Payer: Medicare HMO | Admitting: Hematology and Oncology

## 2021-10-11 ENCOUNTER — Ambulatory Visit: Payer: Medicare HMO

## 2021-10-11 ENCOUNTER — Other Ambulatory Visit: Payer: Medicare HMO

## 2021-11-01 ENCOUNTER — Ambulatory Visit: Payer: Medicare HMO

## 2021-11-01 ENCOUNTER — Ambulatory Visit: Payer: Medicare HMO | Admitting: Hematology and Oncology

## 2021-11-01 ENCOUNTER — Other Ambulatory Visit: Payer: Medicare HMO

## 2021-11-22 ENCOUNTER — Ambulatory Visit: Payer: Medicare HMO | Admitting: Hematology and Oncology

## 2021-11-22 ENCOUNTER — Ambulatory Visit: Payer: Medicare HMO

## 2021-11-22 ENCOUNTER — Other Ambulatory Visit: Payer: Medicare HMO

## 2023-02-18 NOTE — Telephone Encounter (Signed)
TC
